# Patient Record
Sex: Female | Born: 1935
Health system: Southern US, Community
[De-identification: ages and names within clinical notes are randomized; demographics above are authoritative.]

## PROBLEM LIST (undated history)

## (undated) DIAGNOSIS — H9319 Tinnitus, unspecified ear: Secondary | ICD-10-CM

## (undated) DIAGNOSIS — H9190 Unspecified hearing loss, unspecified ear: Secondary | ICD-10-CM

## (undated) DIAGNOSIS — G4700 Insomnia, unspecified: Secondary | ICD-10-CM

## (undated) DIAGNOSIS — E039 Hypothyroidism, unspecified: Secondary | ICD-10-CM

## (undated) DIAGNOSIS — Z8781 Personal history of (healed) traumatic fracture: Secondary | ICD-10-CM

## (undated) HISTORY — PX: MOHS SURGERY: SHX181

## (undated) HISTORY — DX: Insomnia, unspecified: G47.00

## (undated) HISTORY — DX: Hypothyroidism, unspecified: E03.9

## (undated) HISTORY — DX: Personal history of (healed) traumatic fracture: Z87.81

## (undated) HISTORY — DX: Tinnitus, unspecified ear: H93.19

## (undated) HISTORY — DX: Unspecified hearing loss, unspecified ear: H91.90

## (undated) NOTE — *Deleted (*Deleted)
  Problem: Education: Goal: Knowledge of General Education information will improve Description: Including pain rating scale, medication(s)/side effects and non-pharmacologic comfort measures Outcome: Progressing   Problem: Clinical Measurements: Goal: Diagnostic test results will improve Outcome: Progressing   Problem: Activity: Goal: Risk for activity intolerance will decrease Outcome: Progressing   

---

## 1984-03-01 HISTORY — PX: CLAVICLE EXCISION: SHX597

## 2005-03-01 HISTORY — PX: CHOLECYSTECTOMY: SHX55

## 2014-04-06 DIAGNOSIS — J0140 Acute pansinusitis, unspecified: Secondary | ICD-10-CM | POA: Diagnosis not present

## 2014-05-08 DIAGNOSIS — R7301 Impaired fasting glucose: Secondary | ICD-10-CM | POA: Diagnosis not present

## 2014-05-08 DIAGNOSIS — E039 Hypothyroidism, unspecified: Secondary | ICD-10-CM | POA: Diagnosis not present

## 2014-05-08 DIAGNOSIS — E780 Pure hypercholesterolemia: Secondary | ICD-10-CM | POA: Diagnosis not present

## 2014-05-08 DIAGNOSIS — E559 Vitamin D deficiency, unspecified: Secondary | ICD-10-CM | POA: Diagnosis not present

## 2014-05-15 DIAGNOSIS — Z6830 Body mass index (BMI) 30.0-30.9, adult: Secondary | ICD-10-CM | POA: Diagnosis not present

## 2014-05-15 DIAGNOSIS — E780 Pure hypercholesterolemia: Secondary | ICD-10-CM | POA: Diagnosis not present

## 2014-05-15 DIAGNOSIS — E039 Hypothyroidism, unspecified: Secondary | ICD-10-CM | POA: Diagnosis not present

## 2014-05-15 DIAGNOSIS — R7301 Impaired fasting glucose: Secondary | ICD-10-CM | POA: Diagnosis not present

## 2014-05-24 DIAGNOSIS — D492 Neoplasm of unspecified behavior of bone, soft tissue, and skin: Secondary | ICD-10-CM | POA: Diagnosis not present

## 2014-05-24 DIAGNOSIS — C44529 Squamous cell carcinoma of skin of other part of trunk: Secondary | ICD-10-CM | POA: Diagnosis not present

## 2014-06-18 DIAGNOSIS — C44529 Squamous cell carcinoma of skin of other part of trunk: Secondary | ICD-10-CM | POA: Diagnosis not present

## 2014-06-18 DIAGNOSIS — L089 Local infection of the skin and subcutaneous tissue, unspecified: Secondary | ICD-10-CM | POA: Diagnosis not present

## 2014-07-17 DIAGNOSIS — H905 Unspecified sensorineural hearing loss: Secondary | ICD-10-CM | POA: Diagnosis not present

## 2014-08-06 DIAGNOSIS — Z111 Encounter for screening for respiratory tuberculosis: Secondary | ICD-10-CM | POA: Diagnosis not present

## 2014-08-24 DIAGNOSIS — M79672 Pain in left foot: Secondary | ICD-10-CM | POA: Diagnosis not present

## 2014-08-24 DIAGNOSIS — M79671 Pain in right foot: Secondary | ICD-10-CM | POA: Diagnosis not present

## 2014-08-26 DIAGNOSIS — S92355A Nondisplaced fracture of fifth metatarsal bone, left foot, initial encounter for closed fracture: Secondary | ICD-10-CM | POA: Diagnosis not present

## 2014-09-05 DIAGNOSIS — L570 Actinic keratosis: Secondary | ICD-10-CM | POA: Diagnosis not present

## 2014-09-16 DIAGNOSIS — S92355D Nondisplaced fracture of fifth metatarsal bone, left foot, subsequent encounter for fracture with routine healing: Secondary | ICD-10-CM | POA: Diagnosis not present

## 2014-10-23 DIAGNOSIS — Z85828 Personal history of other malignant neoplasm of skin: Secondary | ICD-10-CM | POA: Diagnosis not present

## 2014-10-23 DIAGNOSIS — D225 Melanocytic nevi of trunk: Secondary | ICD-10-CM | POA: Diagnosis not present

## 2014-10-23 DIAGNOSIS — L570 Actinic keratosis: Secondary | ICD-10-CM | POA: Diagnosis not present

## 2014-11-15 DIAGNOSIS — E039 Hypothyroidism, unspecified: Secondary | ICD-10-CM | POA: Diagnosis not present

## 2014-11-15 DIAGNOSIS — R7301 Impaired fasting glucose: Secondary | ICD-10-CM | POA: Diagnosis not present

## 2014-11-15 DIAGNOSIS — E559 Vitamin D deficiency, unspecified: Secondary | ICD-10-CM | POA: Diagnosis not present

## 2014-11-15 DIAGNOSIS — R300 Dysuria: Secondary | ICD-10-CM | POA: Diagnosis not present

## 2014-11-15 DIAGNOSIS — E780 Pure hypercholesterolemia: Secondary | ICD-10-CM | POA: Diagnosis not present

## 2014-11-20 DIAGNOSIS — R7301 Impaired fasting glucose: Secondary | ICD-10-CM | POA: Diagnosis not present

## 2014-11-20 DIAGNOSIS — Z6830 Body mass index (BMI) 30.0-30.9, adult: Secondary | ICD-10-CM | POA: Diagnosis not present

## 2014-11-20 DIAGNOSIS — E038 Other specified hypothyroidism: Secondary | ICD-10-CM | POA: Diagnosis not present

## 2014-11-20 DIAGNOSIS — Z23 Encounter for immunization: Secondary | ICD-10-CM | POA: Diagnosis not present

## 2014-11-20 DIAGNOSIS — E780 Pure hypercholesterolemia: Secondary | ICD-10-CM | POA: Diagnosis not present

## 2015-05-21 DIAGNOSIS — R7301 Impaired fasting glucose: Secondary | ICD-10-CM | POA: Diagnosis not present

## 2015-05-21 DIAGNOSIS — E559 Vitamin D deficiency, unspecified: Secondary | ICD-10-CM | POA: Diagnosis not present

## 2015-05-21 DIAGNOSIS — R300 Dysuria: Secondary | ICD-10-CM | POA: Diagnosis not present

## 2015-05-21 DIAGNOSIS — E7801 Familial hypercholesterolemia: Secondary | ICD-10-CM | POA: Diagnosis not present

## 2015-05-21 DIAGNOSIS — Z6829 Body mass index (BMI) 29.0-29.9, adult: Secondary | ICD-10-CM | POA: Diagnosis not present

## 2015-05-21 DIAGNOSIS — E038 Other specified hypothyroidism: Secondary | ICD-10-CM | POA: Diagnosis not present

## 2015-07-10 DIAGNOSIS — J069 Acute upper respiratory infection, unspecified: Secondary | ICD-10-CM | POA: Diagnosis not present

## 2015-07-10 DIAGNOSIS — J209 Acute bronchitis, unspecified: Secondary | ICD-10-CM | POA: Diagnosis not present

## 2015-09-09 ENCOUNTER — Encounter: Payer: Self-pay | Admitting: Internal Medicine

## 2015-09-09 ENCOUNTER — Non-Acute Institutional Stay (INDEPENDENT_AMBULATORY_CARE_PROVIDER_SITE_OTHER): Payer: Medicare Other | Admitting: Internal Medicine

## 2015-09-09 DIAGNOSIS — H9193 Unspecified hearing loss, bilateral: Secondary | ICD-10-CM

## 2015-09-09 DIAGNOSIS — E039 Hypothyroidism, unspecified: Secondary | ICD-10-CM | POA: Diagnosis not present

## 2015-09-09 DIAGNOSIS — G4700 Insomnia, unspecified: Secondary | ICD-10-CM

## 2015-09-09 NOTE — Progress Notes (Signed)
Patient ID: Lisa Castillo, female   DOB: [birth year], 82 y.o.   MRN: [id number]    HISTORY AND PHYSICAL  Location:    [facility]   Place of Service: Clinic (12)   Extended Emergency Contact Information Primary Emergency Contact: Harris,Margaret Address: [place],  [address] [name] Phone: [phone] Mobile Phone: [phone] Relation: Daughter  Advanced Directive information Does patient have an advance directive?: Yes, Type of Advance Directive: Healthcare Power of [place];Living will  Chief Complaint  Patient presents with  . Medical Management of Chronic Issues    New patient to get Est with [facility]. Moved to [facility] 12/23/14. Needs refill on Synthroid.     HPI:  Generally feeling well. No specific complaints today. Here to establish care.  Hearing loss, bilateral - chronic and without change  Hypothyroidism, unspecified hypothyroidism type - compensated. Asymptomatic.  Insomnia - using temazepam successfully    Past Medical History  Diagnosis Date  . Hypothyroidism   . Tinnitus     Past Surgical History  Procedure Laterality Date  . Cholecystectomy  2007    Dr. Verdene Lennert  . Mohs surgery      past 10 years chest & legs  . Clavicle excision  1986    No care team member to display  Social History   Social History  . Marital Status: Widowed    Spouse Name: N/A  . Number of Children: N/A  . Years of Education: N/A   Occupational History  . retired Web designer    Social History Main Topics  . Smoking status: Never Smoker   . Smokeless tobacco: Never Used  . Alcohol Use: Yes     Comment: 5 per week  . Drug Use: No  . Sexual Activity: No   Other Topics Concern  . Not on file   Social History Narrative   Lives at [facility] since 12/23/14   Widowed   Never smoked   Alcohol 5 per week   Exercise 3 times a week classes, walking.    Activities [place], book groups   POA, Living Will   Still driving          reports that she has never smoked. She has never used smokeless tobacco. She reports that she drinks alcohol. She reports that she does not use illicit drugs.  Family History  Problem Relation Age of Onset  . Heart disease Mother   . Heart disease Father    Family Status  Relation Status Death Age  . Mother Deceased 51  . Father Deceased 90  . Brother Alive   . Daughter Alive   . Son Alive   . Daughter Alive   . Daughter Alive     Immunization History  Administered Date(s) Administered  . DTaP 07/20/2013  . Influenza-Unspecified 11/20/2014  . PPD Test 08/06/2014  . Pneumococcal Conjugate-13 11/07/2013  . Zoster 03/01/2006    No Known Allergies  Medications: Patient's Medications  New Prescriptions   No medications on file  Previous Medications   CHOLECALCIFEROL (VITAMIN D3) 2000 UNITS TABS    Take by mouth. Take one Vitamin D daily   LEVOTHYROXINE (SYNTHROID, LEVOTHROID) 50 MCG TABLET    Take by mouth. Take one tablet at least 30 minutes before breakfast without other medications, for thyroid   MULTIPLE VITAMIN PO    Take by mouth. Take one vitamin daily   OMEGA-3 1400 MG CAPS  Take by mouth. Take one tablet 2-3 times a week   TEMAZEPAM (RESTORIL) 15 MG CAPSULE    Take 15 mg by mouth. Take one at bedtime as needed for sleep  Modified Medications   No medications on file  Discontinued Medications   No medications on file    Review of Systems  Constitutional: Negative for activity change, appetite change, chills, diaphoresis, fatigue, fever and unexpected weight change.  HENT: Positive for hearing loss and tinnitus. Negative for congestion, ear discharge, ear pain, postnasal drip, rhinorrhea, sore throat, trouble swallowing and voice change.   Eyes: Positive for visual disturbance (Corrective lenses). Negative for pain, redness and itching.  Respiratory: Negative for cough, choking, shortness of breath and wheezing.   Cardiovascular:  Negative for chest pain, palpitations and leg swelling.  Gastrointestinal: Negative for abdominal distention, abdominal pain, constipation, diarrhea and nausea.  Endocrine: Negative for cold intolerance, heat intolerance, polydipsia, polyphagia and polyuria.       Hypothyroid  Genitourinary: Negative for difficulty urinating, dysuria, flank pain, frequency, hematuria, pelvic pain, urgency and vaginal discharge.  Musculoskeletal: Negative for arthralgias, back pain, gait problem, myalgias, neck pain and neck stiffness.  Skin: Negative for color change, pallor and rash.  Allergic/Immunologic: Negative.   Neurological: Negative for dizziness, tremors, seizures, syncope, weakness, numbness and headaches.  Hematological: Negative for adenopathy. Does not bruise/bleed easily.  Psychiatric/Behavioral: Positive for sleep disturbance. Negative for agitation, behavioral problems, confusion, dysphoric mood, hallucinations and suicidal ideas. The patient is not nervous/anxious and is not hyperactive.     Filed Vitals:   09/09/15 1034  BP: 124/62  Pulse: 88  Temp: 98 F (36.7 C)  TempSrc: Oral  Height: 5' (1.524 m)  Weight: 155 lb (70.308 kg)  SpO2: 99%   Body mass index is 30.27 kg/(m^2). Filed Weights   09/09/15 1034  Weight: 155 lb (70.308 kg)     Physical Exam  Constitutional: She is oriented to person, place, and time. She appears well-developed and well-nourished. No distress.  HENT:  Right Ear: External ear normal.  Left Ear: External ear normal.  Nose: Nose normal.  Mouth/Throat: Oropharynx is clear and moist. No oropharyngeal exudate.  Eyes: Conjunctivae and EOM are normal. Pupils are equal, round, and reactive to light. No scleral icterus.  Neck: No JVD present. No tracheal deviation present. No thyromegaly present.  Cardiovascular: Normal rate, regular rhythm, normal heart sounds and intact distal pulses.  Exam reveals no gallop and no friction rub.   No murmur  heard. Pulmonary/Chest: Effort normal. No respiratory distress. She has no wheezes. She has no rales. She exhibits no tenderness.  Abdominal: She exhibits no distension and no mass. There is no tenderness.  Musculoskeletal: Normal range of motion. She exhibits no edema or tenderness.  Lymphadenopathy:    She has no cervical adenopathy.  Neurological: She is alert and oriented to person, place, and time. No cranial nerve deficit. Coordination normal.  08/26/2015 MMSE 29/30. Failed clockdrawing.  Skin: No rash noted. She is not diaphoretic. No erythema. No pallor.  Psychiatric: She has a normal mood and affect. Her behavior is normal. Judgment and thought content normal.    Labs reviewed: No flowsheet data found. No results found for: BUN No results found for: HGBA1C No results found for: TSH, T3TOTAL, T4TOTAL, THYROIDAB    Assessment/Plan  1. Hearing loss, bilateral Continue with hearing aids.  2. Hypothyroidism, unspecified hypothyroidism type Continue current dose levothyroxin  3. Insomnia Continue temazepam

## 2015-09-10 ENCOUNTER — Other Ambulatory Visit: Payer: Self-pay

## 2015-09-10 MED ORDER — LEVOTHYROXINE SODIUM 50 MCG PO TABS: ORAL_TABLET | ORAL | Status: DC

## 2015-09-10 NOTE — Telephone Encounter (Signed)
Received call from [facility] from [facility], patient was seen yesterday and forgot to get refill on Synthroid, [facility]. Done

## 2015-09-29 ENCOUNTER — Encounter: Payer: Self-pay | Admitting: Internal Medicine

## 2015-09-29 DIAGNOSIS — H9190 Unspecified hearing loss, unspecified ear: Secondary | ICD-10-CM | POA: Insufficient documentation

## 2015-09-29 DIAGNOSIS — G4700 Insomnia, unspecified: Secondary | ICD-10-CM | POA: Insufficient documentation

## 2015-09-29 DIAGNOSIS — E039 Hypothyroidism, unspecified: Secondary | ICD-10-CM | POA: Insufficient documentation

## 2015-12-10 DIAGNOSIS — Z23 Encounter for immunization: Secondary | ICD-10-CM | POA: Diagnosis not present

## 2015-12-24 ENCOUNTER — Other Ambulatory Visit: Payer: Self-pay

## 2015-12-24 DIAGNOSIS — E038 Other specified hypothyroidism: Secondary | ICD-10-CM

## 2016-02-18 DIAGNOSIS — H16142 Punctate keratitis, left eye: Secondary | ICD-10-CM | POA: Diagnosis not present

## 2016-02-18 DIAGNOSIS — Z961 Presence of intraocular lens: Secondary | ICD-10-CM | POA: Diagnosis not present

## 2016-03-25 DIAGNOSIS — H01006 Unspecified blepharitis left eye, unspecified eyelid: Secondary | ICD-10-CM | POA: Diagnosis not present

## 2016-03-25 DIAGNOSIS — H16142 Punctate keratitis, left eye: Secondary | ICD-10-CM | POA: Diagnosis not present

## 2016-03-25 DIAGNOSIS — Z961 Presence of intraocular lens: Secondary | ICD-10-CM | POA: Diagnosis not present

## 2016-03-25 DIAGNOSIS — H01003 Unspecified blepharitis right eye, unspecified eyelid: Secondary | ICD-10-CM | POA: Diagnosis not present

## 2016-04-27 ENCOUNTER — Non-Acute Institutional Stay: Payer: Medicare Other | Admitting: Internal Medicine

## 2016-04-27 ENCOUNTER — Encounter: Payer: Self-pay | Admitting: Internal Medicine

## 2016-04-27 VITALS — BP 136/80 | HR 104 | Temp 97.9°F | Ht 60.0 in | Wt 162.0 lb

## 2016-04-27 DIAGNOSIS — R2681 Unsteadiness on feet: Secondary | ICD-10-CM | POA: Diagnosis not present

## 2016-04-27 DIAGNOSIS — E038 Other specified hypothyroidism: Secondary | ICD-10-CM | POA: Diagnosis not present

## 2016-04-27 DIAGNOSIS — R269 Unspecified abnormalities of gait and mobility: Secondary | ICD-10-CM | POA: Insufficient documentation

## 2016-04-27 NOTE — Progress Notes (Signed)
Facility  [facility]    Place of Service: Clinic (12)     No Known Allergies  Chief Complaint  Patient presents with  . Acute Visit    balance, dizzy when she stands up for 2 weeks.    HPI:  Onset of off balance feeling about 2 weeks ago. Has improved. Says she had a feeling like she might fall, especially in the mornings when getting out of bed. States there was no spinning vertiginous sensation. No other associated symptoms. Denies change in hearing or vision, or headache. No loss of coordination.  Medications: Patient's Medications  New Prescriptions   No medications on file  Previous Medications   CHOLECALCIFEROL (VITAMIN D3) 2000 UNITS TABS    Take by mouth. Take one Vitamin D daily   LEVOTHYROXINE (SYNTHROID, LEVOTHROID) 50 MCG TABLET    Take one tablet at least 30 minutes before breakfast without other medications, for thyroid   MULTIPLE VITAMIN PO    Take by mouth. Take one vitamin daily   OMEGA-3 1400 MG CAPS    Take by mouth. Take one tablet 5  times a week  Modified Medications   No medications on file  Discontinued Medications   TEMAZEPAM (RESTORIL) 15 MG CAPSULE    Take 15 mg by mouth. Take one at bedtime as needed for sleep     Review of Systems  Constitutional: Negative for activity change, appetite change, chills, diaphoresis, fatigue, fever and unexpected weight change.  HENT: Positive for hearing loss and tinnitus. Negative for congestion, ear discharge, ear pain, postnasal drip, rhinorrhea, sore throat, trouble swallowing and voice change.   Eyes: Positive for visual disturbance (Corrective lenses). Negative for pain, redness and itching.  Respiratory: Negative for cough, choking, shortness of breath and wheezing.   Cardiovascular: Negative for chest pain, palpitations and leg swelling.  Gastrointestinal: Negative for abdominal distention, abdominal pain, constipation, diarrhea and nausea.  Endocrine: Negative for cold intolerance, heat intolerance,  polydipsia, polyphagia and polyuria.       Hypothyroid  Genitourinary: Negative for difficulty urinating, dysuria, flank pain, frequency, hematuria, pelvic pain, urgency and vaginal discharge.  Musculoskeletal: Negative for arthralgias, back pain, gait problem, myalgias, neck pain and neck stiffness.  Skin: Negative for color change, pallor and rash.  Allergic/Immunologic: Negative.   Neurological: Negative for dizziness, tremors, seizures, syncope, weakness, numbness and headaches.       Off balance feeling  Hematological: Negative for adenopathy. Does not bruise/bleed easily.  Psychiatric/Behavioral: Positive for sleep disturbance. Negative for agitation, behavioral problems, confusion, dysphoric mood, hallucinations and suicidal ideas. The patient is not nervous/anxious and is not hyperactive.     Vitals:   04/27/16 0916 04/27/16 0922  BP: 132/64 136/80  Pulse: 80 (!) 104  Temp: 97.9 F (36.6 C)   TempSrc: Oral   SpO2: 98%   Weight: 162 lb (73.5 kg)   Height: 5' (1.524 m)    Wt Readings from Last 3 Encounters:  04/27/16 162 lb (73.5 kg)  09/09/15 155 lb (70.3 kg)    Body mass index is 31.64 kg/m.  Physical Exam  Constitutional: She is oriented to person, place, and time. She appears well-developed and well-nourished. No distress.  HENT:  Right Ear: External ear normal.  Left Ear: External ear normal.  Nose: Nose normal.  Mouth/Throat: Oropharynx is clear and moist. No oropharyngeal exudate.  Eyes: Conjunctivae and EOM are normal. Pupils are equal, round, and reactive to light. No scleral icterus.  Neck: No JVD present. No tracheal deviation  present. No thyromegaly present.  Cardiovascular: Normal rate, regular rhythm, normal heart sounds and intact distal pulses.  Exam reveals no gallop and no friction rub.   No murmur heard. Pulmonary/Chest: Effort normal. No respiratory distress. She has no wheezes. She has no rales. She exhibits no tenderness.  Abdominal: She exhibits  no distension and no mass. There is no tenderness.  Musculoskeletal: Normal range of motion. She exhibits no edema or tenderness.  Lymphadenopathy:    She has no cervical adenopathy.  Neurological: She is alert and oriented to person, place, and time. No cranial nerve deficit. Coordination normal.  08/26/2015 MMSE 29/30. Failed clockdrawing. Normal tandem gait. Able to stand with eyes closed without evidence of instability. She can walk on toes and heels without imbalance.  Skin: No rash noted. She is not diaphoretic. No erythema. No pallor.  Psychiatric: She has a normal mood and affect. Her behavior is normal. Judgment and thought content normal.     Labs reviewed: No flowsheet data found. No results found for: TSH No results found for: BUN No results found for: CREATININE No results found for: HGBA1C     Assessment/Plan  1. Unstable gait - CMP  2. Other specified hypothyroidism - TSH

## 2016-04-28 DIAGNOSIS — E038 Other specified hypothyroidism: Secondary | ICD-10-CM | POA: Diagnosis not present

## 2016-04-29 ENCOUNTER — Other Ambulatory Visit: Payer: Self-pay

## 2016-04-29 LAB — COMPLETE METABOLIC PANEL WITH GFR
ALBUMIN: 3.7 g/dL (ref 3.6–5.1)
ALK PHOS: 98 U/L (ref 33–130)
ALT: 13 U/L (ref 6–29)
AST: 17 U/L (ref 10–35)
BUN: 11 mg/dL (ref 7–25)
CALCIUM: 8.7 mg/dL (ref 8.6–10.4)
CO2: 27 mmol/L (ref 20–31)
Chloride: 105 mmol/L (ref 98–110)
Creat: 0.73 mg/dL (ref 0.60–0.88)
GFR, EST AFRICAN AMERICAN: 87 mL/min (ref >=60)
GFR, EST NON AFRICAN AMERICAN: 75 mL/min (ref >=60)
Glucose, Bld: 116 mg/dL — ABNORMAL HIGH (ref 65–99)
POTASSIUM: 4.2 mmol/L (ref 3.5–5.3)
Sodium: 139 mmol/L (ref 135–146)
Total Bilirubin: 0.3 mg/dL (ref 0.2–1.2)
Total Protein: 6.4 g/dL (ref 6.1–8.1)

## 2016-04-29 LAB — TSH: TSH: 4.55 m[IU]/L — AB

## 2016-05-03 ENCOUNTER — Telehealth: Payer: Self-pay | Admitting: *Deleted

## 2016-05-03 MED ORDER — TEMAZEPAM 15 MG PO CAPS: ORAL_CAPSULE | ORAL | 1 refills | Status: DC

## 2016-05-03 NOTE — Telephone Encounter (Signed)
Rx faxed to pharmacy  

## 2016-05-03 NOTE — Telephone Encounter (Signed)
Patient called and stated that she is dealing with family problems right now and having a hard time sleeping at night. Wants to know if we could call in her old Rx for Temazepam 15mg  into [facility]. Please Advise.

## 2016-05-03 NOTE — Telephone Encounter (Signed)
OK to fil Temazepam 15 mg (30) One nightly if needed for sleep.

## 2016-06-09 ENCOUNTER — Other Ambulatory Visit: Payer: Self-pay | Admitting: Internal Medicine

## 2016-06-29 ENCOUNTER — Encounter: Payer: Self-pay | Admitting: Internal Medicine

## 2016-06-29 ENCOUNTER — Non-Acute Institutional Stay: Payer: Medicare Other | Admitting: Internal Medicine

## 2016-06-29 VITALS — BP 104/64 | HR 88 | Temp 97.9°F | Ht 60.0 in | Wt 162.0 lb

## 2016-06-29 DIAGNOSIS — R2681 Unsteadiness on feet: Secondary | ICD-10-CM

## 2016-06-29 DIAGNOSIS — E038 Other specified hypothyroidism: Secondary | ICD-10-CM

## 2016-06-29 DIAGNOSIS — F5101 Primary insomnia: Secondary | ICD-10-CM

## 2016-06-29 DIAGNOSIS — R739 Hyperglycemia, unspecified: Secondary | ICD-10-CM | POA: Diagnosis not present

## 2016-06-29 DIAGNOSIS — E782 Mixed hyperlipidemia: Secondary | ICD-10-CM | POA: Insufficient documentation

## 2016-06-29 DIAGNOSIS — E785 Hyperlipidemia, unspecified: Secondary | ICD-10-CM | POA: Diagnosis not present

## 2016-06-29 NOTE — Progress Notes (Signed)
Streeter of Service: Clinic (12)     No Known Allergies  Chief Complaint  Patient presents with  . Medical Management of Chronic Issues    medication management thyroid, unstable gait, review lab    HPI:  Other specified hypothyroidism - trivial elevation in the TSH at 4.55  Hyperglycemia - CBG 116. She reports glucose has had a mild elevation for many years.Lisa Castillo is only eating about 1 dessert weekly.  Unstable gait - no falls  Primary insomnia - stable to improved    Medications: Patient's Medications  New Prescriptions   No medications on file  Previous Medications   CHOLECALCIFEROL (VITAMIN D3) 2000 UNITS TABS    Take by mouth. Take one Vitamin D daily   MULTIPLE VITAMIN PO    Take by mouth. Take one vitamin daily   OMEGA-3 1400 MG CAPS    Take by mouth. Take one tablet 5  times a week   SYNTHROID 50 MCG TABLET    TAKE 1 TABLET BY MOUTH AT LEAST 30 MINUTES BEFORE BREAKFAST WITHOUT OTHER MEDICATIONS FOR THYROID   TEMAZEPAM (RESTORIL) 15 MG CAPSULE    Take one capsule by mouth nightly as needed for sleep  Modified Medications   No medications on file  Discontinued Medications   No medications on file     Review of Systems  Constitutional: Negative for activity change, appetite change, chills, diaphoresis, fatigue, fever and unexpected weight change.  HENT: Positive for hearing loss and tinnitus. Negative for congestion, ear discharge, ear pain, postnasal drip, rhinorrhea, sore throat, trouble swallowing and voice change.   Eyes: Positive for visual disturbance (Corrective lenses). Negative for pain, redness and itching.  Respiratory: Negative for cough, choking, shortness of breath and wheezing.   Cardiovascular: Negative for chest pain, palpitations and leg swelling.  Gastrointestinal: Negative for abdominal distention, abdominal pain, constipation, diarrhea and nausea.  Endocrine: Negative for cold intolerance, heat intolerance, polydipsia,  polyphagia and polyuria.       Hypothyroid  Genitourinary: Negative for difficulty urinating, dysuria, flank pain, frequency, hematuria, pelvic pain, urgency and vaginal discharge.  Musculoskeletal: Negative for arthralgias, back pain, gait problem, myalgias, neck pain and neck stiffness.  Skin: Negative for color change, pallor and rash.  Allergic/Immunologic: Negative.   Neurological: Negative for dizziness, tremors, seizures, syncope, weakness, numbness and headaches.       Off balance feeling  Hematological: Negative for adenopathy. Does not bruise/bleed easily.  Psychiatric/Behavioral: Positive for sleep disturbance. Negative for agitation, behavioral problems, confusion, dysphoric mood, hallucinations and suicidal ideas. The patient is not nervous/anxious and is not hyperactive.     Vitals:   06/29/16 1054  BP: 104/64  Pulse: 88  Temp: 97.9 F (36.6 C)  TempSrc: Oral  SpO2: 98%  Weight: 162 lb (73.5 kg)  Height: 5' (1.524 m)   Wt Readings from Last 3 Encounters:  06/29/16 162 lb (73.5 kg)  04/27/16 162 lb (73.5 kg)  09/09/15 155 lb (70.3 kg)    Body mass index is 31.64 kg/m.  Physical Exam  Constitutional: She is oriented to person, place, and time. She appears well-developed and well-nourished. No distress.  HENT:  Right Ear: External ear normal.  Left Ear: External ear normal.  Nose: Nose normal.  Mouth/Throat: Oropharynx is clear and moist. No oropharyngeal exudate.  Eyes: Conjunctivae and EOM are normal. Pupils are equal, round, and reactive to light. No scleral icterus.  Neck: No JVD present. No tracheal deviation present. No  thyromegaly present.  Cardiovascular: Normal rate, regular rhythm, normal heart sounds and intact distal pulses.  Exam reveals no gallop and no friction rub.   No murmur heard. Pulmonary/Chest: Effort normal. No respiratory distress. She has no wheezes. She has no rales. She exhibits no tenderness.  Abdominal: She exhibits no distension and  no mass. There is no tenderness.  Musculoskeletal: Normal range of motion. She exhibits no edema or tenderness.  Lymphadenopathy:    She has no cervical adenopathy.  Neurological: She is alert and oriented to person, place, and time. No cranial nerve deficit. Coordination normal.  08/26/2015 MMSE 29/30. Failed clockdrawing. Normal tandem gait. Able to stand with eyes closed without evidence of instability. She can walk on toes and heels without imbalance.  Skin: No rash noted. She is not diaphoretic. No erythema. No pallor.  Psychiatric: She has a normal mood and affect. Her behavior is normal. Judgment and thought content normal.     Labs reviewed: Lab Summary Latest Ref Rng & Units 04/28/2016  Hemoglobin - (None)  Hematocrit - (None)  White count - (None)  Platelet count - (None)  Sodium 135 - 146 mmol/L 139  Potassium 3.5 - 5.3 mmol/L 4.2  Calcium 8.6 - 10.4 mg/dL 8.7  Phosphorus - (None)  Creatinine 0.60 - 0.88 mg/dL 0.73  AST 10 - 35 U/L 17  Alk Phos 33 - 130 U/L 98  Bilirubin 0.2 - 1.2 mg/dL 0.3  Glucose 65 - 99 mg/dL 116(H)  Cholesterol - (None)  HDL cholesterol - (None)  Triglycerides - (None)  LDL Direct - (None)  LDL Calc - (None)  Total protein 6.1 - 8.1 g/dL 6.4  Albumin 3.6 - 5.1 g/dL 3.7   Lab Results  Component Value Date   TSH 4.55 (H) 04/28/2016   Lab Results  Component Value Date   BUN 11 04/28/2016   Lab Results  Component Value Date   CREATININE 0.73 04/28/2016   No results found for: HGBA1C     Assessment/Plan  1. Other specified hypothyroidism The current medical regimen is effective;  continue present plan and medications. - TSH; Future  2. Hyperglycemia - lose weght - Hemoglobin A1c; Future - Comprehensive metabolic panel; Future - Microalbumin, urine; Future  3. Unstable gait improved  4. Primary insomnia stable  5. Hyperlipidemia, unspecified hyperlipidemia type - Lipid panel; Future

## 2016-08-02 ENCOUNTER — Encounter: Payer: Self-pay | Admitting: Internal Medicine

## 2016-08-10 ENCOUNTER — Encounter: Payer: Self-pay | Admitting: Internal Medicine

## 2016-08-16 ENCOUNTER — Telehealth: Payer: Self-pay | Admitting: *Deleted

## 2016-08-16 DIAGNOSIS — Z0110 Encounter for examination of ears and hearing without abnormal findings: Secondary | ICD-10-CM

## 2016-08-16 NOTE — Telephone Encounter (Signed)
Patient called and stated that she needed a referral to [facility]. Has an appointment for 09/06/2016. Referral placed.

## 2016-08-31 NOTE — Addendum Note (Signed)
Addended by: Royann Shivers A on: 08/31/2016 04:06 PM   Modules accepted: Orders

## 2016-09-07 ENCOUNTER — Encounter: Payer: Self-pay | Admitting: Internal Medicine

## 2016-09-19 ENCOUNTER — Other Ambulatory Visit: Payer: Self-pay | Admitting: Internal Medicine

## 2016-10-12 ENCOUNTER — Other Ambulatory Visit: Payer: Self-pay | Admitting: *Deleted

## 2016-10-12 DIAGNOSIS — E785 Hyperlipidemia, unspecified: Secondary | ICD-10-CM

## 2016-10-12 DIAGNOSIS — R2681 Unsteadiness on feet: Secondary | ICD-10-CM

## 2016-10-12 DIAGNOSIS — E039 Hypothyroidism, unspecified: Secondary | ICD-10-CM

## 2016-10-12 DIAGNOSIS — G4700 Insomnia, unspecified: Secondary | ICD-10-CM

## 2016-10-29 ENCOUNTER — Encounter: Payer: Self-pay | Admitting: Internal Medicine

## 2016-11-23 ENCOUNTER — Non-Acute Institutional Stay: Payer: Medicare Other | Admitting: Family

## 2016-11-23 ENCOUNTER — Encounter: Payer: Self-pay | Admitting: Family

## 2016-11-23 VITALS — BP 136/78 | HR 89 | Temp 97.6°F | Resp 16 | Ht 60.0 in | Wt 162.0 lb

## 2016-11-23 DIAGNOSIS — M25571 Pain in right ankle and joints of right foot: Secondary | ICD-10-CM | POA: Diagnosis not present

## 2016-11-23 MED ORDER — ACETAMINOPHEN 500 MG PO TABS: 500.0000 mg | ORAL_TABLET | Freq: Four times a day (QID) | ORAL | 0 refills | Status: DC | PRN

## 2016-11-23 NOTE — Progress Notes (Signed)
Location:  [facility] (12) Provider: Huan Pollok FNP-C  Blanchie Serve, MD  Patient Care Team: Blanchie Serve, MD as PCP - General (Internal Medicine)  Extended Emergency Contact Information Primary Emergency Contact: Harris,Margaret Address: [address]          [place], [place] [address] [name] of [place] Phone: [phone] Mobile Phone: [phone] Relation: Daughter  Code Status:  DNR Goals of care: Advanced Directive information Advanced Directives 06/29/2016  Does Patient Have a Medical Advance Directive? Yes  Type of Advance Directive [place] in Chart? Yes     Chief Complaint  Patient presents with  . Acute Visit    right ankle pain x 2 days; no recalled injury;     HPI:  Pt is a 83 y.o. female seen [facility] at [facility] for an acute visit for evaluation of right ankle pain x 2 days. She denies any injury or twisting to ankle.she states leg has been swollen at times.No fever, chills or redness noted.    Past Medical History:  Diagnosis Date  . Hearing loss   . History of fracture of clavicle   . Hypothyroidism   . Insomnia   . Tinnitus    Past Surgical History:  Procedure Laterality Date  . CHOLECYSTECTOMY  2007   Dr. Verdene Lennert  . CLAVICLE EXCISION  1986  . MOHS SURGERY     past 10 years chest & legs    No Known Allergies  Outpatient Encounter Prescriptions as of 11/23/2016  Medication Sig  . Cholecalciferol (VITAMIN D3) 2000 units TABS Take by mouth. Take one Vitamin D daily  . MULTIPLE VITAMIN PO Take by mouth. Take one vitamin daily  . Omega-3 1400 MG CAPS Take by mouth. Take one tablet 5  times a week  . SYNTHROID 50 MCG tablet TAKE 1 TABLET BY MOUTH AT LEAST 30 MINUTES BEFORE BREAKFAST WITHOUT OTHER MEDICATIONS FOR THYROID  . temazepam (RESTORIL) 15 MG capsule Take one capsule by mouth nightly as needed for sleep  . acetaminophen  (TYLENOL) 500 MG tablet Take 1 tablet (500 mg total) by mouth every 6 (six) hours as needed for moderate pain.   No facility-administered encounter medications on file as of 11/23/2016.     Review of Systems  Musculoskeletal: Positive for joint swelling.       Right ankle pain worst with movement.   All other systems reviewed and are negative.   Immunization History  Administered Date(s) Administered  . DTaP 07/20/2013  . Influenza-Unspecified 11/20/2014, 12/12/2015  . PPD Test 08/06/2014  . Pneumococcal Conjugate-13 11/07/2013  . Zoster 03/01/2006   Pertinent  Health Maintenance Due  Topic Date Due  . DEXA SCAN  08/20/1995  . PNA vac Low Risk Adult (2 of 2 - PPSV23) 11/08/2014  . INFLUENZA VACCINE  09/29/2016   Fall Risk  09/09/2015  Falls in the past year? No    Vitals:   11/23/16 1415  BP: 136/78  Pulse: 89  Resp: 16  Temp: 97.6 F (36.4 C)  TempSrc: Oral  SpO2: 92%  Weight: 162 lb (73.5 kg)  Height: 5' (1.524 m)   Body mass index is 31.64 kg/m. Physical Exam  Constitutional: She is oriented to person, place, and time. She appears well-developed and well-nourished. No distress.  HENT:  Head: Normocephalic.  Mouth/Throat: Oropharynx is clear and moist. No oropharyngeal exudate.  Musculoskeletal: She exhibits no edema.  Moves x 4 extremities except right ankle pain with ROM.Malleous tender to palpation. No redness or swelling noted on right ankle.   Neurological: She is oriented to person, place, and time. Coordination normal.  Skin: Skin is warm and dry. No rash noted. No erythema. No pallor.  Psychiatric: She has a normal mood and affect.   Labs reviewed:  Recent Labs  04/28/16 0001  NA 139  K 4.2  CL 105  CO2 27  GLUCOSE 116*  BUN 11  CREATININE 0.73  CALCIUM 8.7    Recent Labs  04/28/16 0001  AST 17  ALT 13  ALKPHOS 98  BILITOT 0.3  PROT 6.4  ALBUMIN 3.7   Lab Results  Component Value Date   TSH 4.55 (H) 04/28/2016    Significant  Diagnostic Results in last 30 days:  No results found.  Assessment/Plan  Acute right ankle pain Afebrile.Pain worst with ROM.Malleous tender to palpation.No redness or swelling noted.start extra strength tylenol 500 mg Tablet every 6 hours as needed for pain. Portable right Ankle X-ray 3 views to rule out fracture. Continue to monitor. Notify provider if no improvement.   Family/ staff Communication: Reviewed plan of care with patient and patient's daughter.  Labs/tests ordered: Portable right Ankle X-ray 3 views to rule out fracture.  Follow up: PRN   Sandrea Hughs, NP

## 2016-11-24 ENCOUNTER — Telehealth: Payer: Self-pay | Admitting: *Deleted

## 2016-11-24 NOTE — Telephone Encounter (Signed)
Patient notified of her negative ankle xray results. Advised to call back on 11/29/16 if no improvement after Tylenol 500mg  Q6hrs PRN. She verbalized understanding.

## 2016-12-08 DIAGNOSIS — Z23 Encounter for immunization: Secondary | ICD-10-CM | POA: Diagnosis not present

## 2016-12-14 ENCOUNTER — Other Ambulatory Visit: Payer: Self-pay | Admitting: Internal Medicine

## 2016-12-28 ENCOUNTER — Other Ambulatory Visit: Payer: Self-pay | Admitting: Internal Medicine

## 2016-12-31 DIAGNOSIS — R739 Hyperglycemia, unspecified: Secondary | ICD-10-CM | POA: Diagnosis not present

## 2016-12-31 DIAGNOSIS — R2681 Unsteadiness on feet: Secondary | ICD-10-CM | POA: Diagnosis not present

## 2016-12-31 DIAGNOSIS — G4700 Insomnia, unspecified: Secondary | ICD-10-CM | POA: Diagnosis not present

## 2016-12-31 DIAGNOSIS — E785 Hyperlipidemia, unspecified: Secondary | ICD-10-CM | POA: Diagnosis not present

## 2016-12-31 DIAGNOSIS — E039 Hypothyroidism, unspecified: Secondary | ICD-10-CM | POA: Diagnosis not present

## 2017-01-04 LAB — COMPREHENSIVE METABOLIC PANEL
AG RATIO: 1.4 (calc) (ref 1.0–2.5)
ALBUMIN MSPROF: 3.8 g/dL (ref 3.6–5.1)
ALT: 17 U/L (ref 6–29)
AST: 17 U/L (ref 10–35)
Alkaline phosphatase (APISO): 101 U/L (ref 33–130)
BUN: 16 mg/dL (ref 7–25)
CALCIUM: 8.8 mg/dL (ref 8.6–10.4)
CO2: 27 mmol/L (ref 20–32)
Chloride: 103 mmol/L (ref 98–110)
Creat: 0.66 mg/dL (ref 0.60–0.88)
GLOBULIN: 2.8 g/dL (ref 1.9–3.7)
GLUCOSE: 132 mg/dL — AB (ref 65–99)
POTASSIUM: 4.4 mmol/L (ref 3.5–5.3)
Sodium: 138 mmol/L (ref 135–146)
TOTAL PROTEIN: 6.6 g/dL (ref 6.1–8.1)
Total Bilirubin: 0.3 mg/dL (ref 0.2–1.2)

## 2017-01-04 LAB — TSH: TSH: 5.72 mIU/L — ABNORMAL HIGH (ref 0.40–4.50)

## 2017-01-04 LAB — LIPID PANEL
CHOLESTEROL: 157 mg/dL (ref <200)
HDL: 33 mg/dL — ABNORMAL LOW (ref >50)
LDL CHOLESTEROL (CALC): 97 mg/dL
Non-HDL Cholesterol (Calc): 124 mg/dL (calc) (ref <130)
Total CHOL/HDL Ratio: 4.8 (calc) (ref <5.0)
Triglycerides: 168 mg/dL — ABNORMAL HIGH (ref <150)

## 2017-01-04 LAB — MICROALBUMIN, URINE: Microalb, Ur: 0.3 mg/dL

## 2017-01-04 LAB — HEMOGLOBIN A1C
Hgb A1c MFr Bld: 6.2 % of total Hgb — ABNORMAL HIGH (ref <5.7)
Mean Plasma Glucose: 131 (calc)
eAG (mmol/L): 7.3 (calc)

## 2017-01-11 ENCOUNTER — Encounter: Payer: Self-pay | Admitting: Internal Medicine

## 2017-01-12 ENCOUNTER — Encounter: Payer: Medicare Other | Admitting: Internal Medicine

## 2017-01-13 ENCOUNTER — Encounter: Payer: Medicare Other | Admitting: Internal Medicine

## 2017-01-19 ENCOUNTER — Encounter: Payer: Self-pay | Admitting: Internal Medicine

## 2017-01-19 ENCOUNTER — Non-Acute Institutional Stay: Payer: Medicare Other | Admitting: Internal Medicine

## 2017-01-19 VITALS — BP 110/68 | HR 96 | Temp 97.8°F | Resp 16 | Ht 60.0 in | Wt 162.8 lb

## 2017-01-19 DIAGNOSIS — H9193 Unspecified hearing loss, bilateral: Secondary | ICD-10-CM

## 2017-01-19 DIAGNOSIS — E785 Hyperlipidemia, unspecified: Secondary | ICD-10-CM | POA: Diagnosis not present

## 2017-01-19 DIAGNOSIS — E669 Obesity, unspecified: Secondary | ICD-10-CM | POA: Diagnosis not present

## 2017-01-19 DIAGNOSIS — E038 Other specified hypothyroidism: Secondary | ICD-10-CM

## 2017-01-19 DIAGNOSIS — R2681 Unsteadiness on feet: Secondary | ICD-10-CM

## 2017-01-19 DIAGNOSIS — E559 Vitamin D deficiency, unspecified: Secondary | ICD-10-CM | POA: Diagnosis not present

## 2017-01-19 DIAGNOSIS — R7303 Prediabetes: Secondary | ICD-10-CM | POA: Diagnosis not present

## 2017-01-19 DIAGNOSIS — F5101 Primary insomnia: Secondary | ICD-10-CM

## 2017-01-19 DIAGNOSIS — Z780 Asymptomatic menopausal state: Secondary | ICD-10-CM | POA: Diagnosis not present

## 2017-01-19 MED ORDER — LEVOTHYROXINE SODIUM 75 MCG PO TABS: 75.0000 ug | ORAL_TABLET | Freq: Every day | ORAL | 3 refills | Status: DC

## 2017-01-19 MED ORDER — CALCIUM 600 MG PO TABS: 600.0000 mg | ORAL_TABLET | Freq: Two times a day (BID) | ORAL | 3 refills | Status: DC

## 2017-01-19 MED ORDER — TEMAZEPAM 15 MG PO CAPS: ORAL_CAPSULE | ORAL | 1 refills | Status: DC

## 2017-01-19 NOTE — Patient Instructions (Signed)
  Start taking synthroid 1 and a half tablet of current dose + 75 mcg daily until you run out of current supply. Then pick up the new strength dosing from your pharmacy  Start taking calcium supplement twice a day.  You will get a call from our office to schedule a bone density study. This is important to assess how strong your bones are and to prevent bone loss.

## 2017-01-19 NOTE — Progress Notes (Signed)
[facility]  Provider: Blanchie Serve MD   Location:  [place] of Service:  Clinic (12)  PCP: Blanchie Serve, MD Patient Care Team: Blanchie Serve, MD as PCP - General (Internal Medicine)  Extended Emergency Contact Information Primary Emergency Contact: Harris,Margaret Address: [address]          [place], [place] [address] [name] of [place] Phone: [phone] Mobile Phone: [phone] Relation: Daughter  Goals of Care: Advanced Directive information Advanced Directives 06/29/2016  Does Patient Have a Medical Advance Directive? Yes  Type of Advance Directive [place] in Chart? Yes      Chief Complaint  Patient presents with  . Medical Management of Chronic Issues    6 month follow up. No concerns at this time.   . Medication Refill    Temazepam( pending order)    HPI: [name] a 83 y.o. female seen today for routine visit. She does some group exercises and walks for exercise.   Vitamin d def- currently on vitamin d supplement. No fall reported.   Hypothyroidism- currently on levothyroxine 50 mcg daily. Takes it empty stomach. Reviewed recent TSH level.   Hyperlipidemia- currently on fish oil daily. Reviewed her recent lipid panel.   Insomnia- takes temazepam 1-2 times a week and it helps her rest. No side effects from it. No fall reported.   Unsteady gait- no assistive device used, no fall reported  uptodate with influenza vaccine.   Depression screen PHQ 2/9 01/19/2017  Decreased Interest 0  Down, Depressed, Hopeless 0  PHQ - 2 Score 0   Fall Risk  01/19/2017 11/23/2016 09/09/2015  Falls in the past year? No No No     Past Medical History:  Diagnosis Date  . Hearing loss   . History of fracture of clavicle   . Hypothyroidism   . Insomnia   . Tinnitus    Past Surgical History:  Procedure Laterality Date  . CHOLECYSTECTOMY  2007   Dr. Verdene Lennert  .  CLAVICLE EXCISION  1986  . MOHS SURGERY     past 10 years chest & legs    reports that  has never smoked. she has never used smokeless tobacco. She reports that she drinks alcohol. She reports that she does not use drugs. Social History   Socioeconomic History  . Marital status: Widowed    Spouse name: Not on file  . Number of children: Not on file  . Years of education: Not on file  . Highest education level: Not on file  Social Needs  . Financial resource strain: Not on file  . Food insecurity - worry: Not on file  . Food insecurity - inability: Not on file  . Transportation needs - medical: Not on file  . Transportation needs - non-medical: Not on file  Occupational History  . Occupation: retired Web designer  Tobacco Use  . Smoking status: Never Smoker  . Smokeless tobacco: Never Used  Substance and Sexual Activity  . Alcohol use: Yes    Comment: 5 per week  . Drug use: No  . Sexual activity: No  Other Topics Concern  . Not on file  Social History Narrative   Lives at [facility] since 12/23/14   Widowed   Never smoked   Alcohol 5 per week   Exercise 3 times a week classes, walking.    Activities Bridge, book groups  POA, Living Will   Still driving       Functional Status Survey:    Family History  Problem Relation Age of Onset  . Heart disease Mother   . Heart disease Father     Health Maintenance  Topic Date Due  . DEXA SCAN  08/20/1995  . PNA vac Low Risk Adult (2 of 2 - PPSV23) 11/08/2014  . TETANUS/TDAP  11/24/2026 (Originally 08/19/1947)  . INFLUENZA VACCINE  Completed    No Known Allergies  Outpatient Encounter Medications as of 01/19/2017  Medication Sig  . acetaminophen (TYLENOL) 500 MG tablet Take 1 tablet (500 mg total) by mouth every 6 (six) hours as needed for moderate pain.  . Cholecalciferol (VITAMIN D3) 2000 units TABS Take by mouth. Take one Vitamin D daily  . MULTIPLE VITAMIN PO Take by mouth. Take one vitamin daily  .  Omega-3 1400 MG CAPS Take by mouth. Take one tablet 5  times a week  . SYNTHROID 50 MCG tablet TAKE 1 TABLET BY MOUTH AT LEAST 30 MINUTES BEFORE BREAKFAST WITHOUT OTHER MEDICATIONS FOR THYROID  . temazepam (RESTORIL) 15 MG capsule Take one capsule by mouth nightly as needed for sleep   No facility-administered encounter medications on file as of 01/19/2017.     Review of Systems  Constitutional: Negative for appetite change, chills, fatigue and fever.  HENT: Positive for hearing loss. Negative for congestion, mouth sores, postnasal drip, rhinorrhea, sore throat and trouble swallowing.        Has hearing aids.  Eyes: Negative for discharge, redness, itching and visual disturbance.       Had cataract surgery and has corrective lenses.   Respiratory: Negative for cough, shortness of breath and wheezing.   Cardiovascular: Negative for chest pain, palpitations and leg swelling.  Gastrointestinal: Negative for abdominal pain, blood in stool, constipation, diarrhea, nausea and vomiting.  Genitourinary: Negative for dysuria, flank pain, frequency, hematuria and pelvic pain.  Musculoskeletal: Positive for arthralgias and gait problem. Negative for back pain and joint swelling.       Left hip pain mainly with movement. No fall reported.   Skin: Negative for rash and wound.  Neurological: Negative for dizziness, tremors, syncope and headaches.  Hematological: Does not bruise/bleed easily.  Psychiatric/Behavioral: Positive for sleep disturbance. Negative for behavioral problems, confusion and dysphoric mood.    Vitals:   01/19/17 1007  BP: 110/68  Pulse: 96  Resp: 16  Temp: 97.8 F (36.6 C)  TempSrc: Oral  SpO2: 97%  Weight: 162 lb 12.8 oz (73.8 kg)  Height: 5' (1.524 m)   Body mass index is 31.79 kg/m.   Wt Readings from Last 3 Encounters:  01/19/17 162 lb 12.8 oz (73.8 kg)  11/23/16 162 lb (73.5 kg)  06/29/16 162 lb (73.5 kg)   Physical Exam  Constitutional: She is oriented to  person, place, and time. She appears well-developed. No distress.  obese  HENT:  Head: Normocephalic and atraumatic.  Right Ear: External ear normal.  Left Ear: External ear normal.  Nose: Nose normal.  Mouth/Throat: Oropharynx is clear and moist. No oropharyngeal exudate.  Hearing aid to both ears  Eyes: Conjunctivae and EOM are normal. Pupils are equal, round, and reactive to light. Right eye exhibits no discharge. Left eye exhibits no discharge. No scleral icterus.  Neck: Normal range of motion. Neck supple.  Cardiovascular: Normal rate and regular rhythm.  Pulmonary/Chest: Effort normal and breath sounds normal. No respiratory distress. She has no wheezes. She has no  rales. She exhibits no tenderness.  Abdominal: Soft. Bowel sounds are normal.  Musculoskeletal: Normal range of motion. She exhibits no edema.  Arthritis changes to fingers  Lymphadenopathy:    She has no cervical adenopathy.  Neurological: She is alert and oriented to person, place, and time.  Skin: Skin is warm and dry. No rash noted. She is not diaphoretic. No pallor.  Psychiatric: She has a normal mood and affect. Her behavior is normal.    Labs reviewed: Basic Metabolic Panel: Recent Labs    04/28/16 0001 12/31/16 0000  NA 139 138  K 4.2 4.4  CL 105 103  CO2 27 27  GLUCOSE 116* 132*  BUN 11 16  CREATININE 0.73 0.66  CALCIUM 8.7 8.8   Liver Function Tests: Recent Labs    04/28/16 0001 12/31/16 0000  AST 17 17  ALT 13 17  ALKPHOS 98  --   BILITOT 0.3 0.3  PROT 6.4 6.6  ALBUMIN 3.7  --    No results for input(s): LIPASE, AMYLASE in the last 8760 hours. No results for input(s): AMMONIA in the last 8760 hours. CBC: No results for input(s): WBC, NEUTROABS, HGB, HCT, MCV, PLT in the last 8760 hours. Cardiac Enzymes: No results for input(s): CKTOTAL, CKMB, CKMBINDEX, TROPONINI in the last 8760 hours. BNP: Invalid input(s): POCBNP Lab Results  Component Value Date   HGBA1C 6.2 (H) 12/31/2016    Lab Results  Component Value Date   TSH 5.72 (H) 12/31/2016   No results found for: VITAMINB12 No results found for: FOLATE No results found for: IRON, TIBC, FERRITIN  Lipid Panel: Recent Labs    12/31/16 0000  CHOL 157  HDL 33*  TRIG 168*  CHOLHDL 4.8   Lab Results  Component Value Date   HGBA1C 6.2 (H) 12/31/2016    Procedures since last visit: No results found.  Assessment/Plan  1. Other specified hypothyroidism With elevated TSH, increase levothyroxine to 75 mcg daily from 50 mcg, monitor - levothyroxine (SYNTHROID, LEVOTHROID) 75 MCG tablet; Take 1 tablet (75 mcg total) by mouth daily before breakfast.  Dispense: 90 tablet; Refill: 3 - TSH; Future  2. Bilateral hearing loss, unspecified hearing loss type Has hearing aid, supportive care  3. Primary insomnia  - temazepam (RESTORIL) 15 MG capsule; Take one capsule by mouth nightly as needed for sleep  Dispense: 30 capsule; Refill: 1  4. Unstable gait Fall precautions. No assistive device used.  5. Prediabetes Check a1c. Dietary counselling about portion control, cutting down on sweets.  - CMP; Future - Lipid Panel; Future - TSH; Future - Hemoglobin A1c; Future  6. Hyperlipidemia, unspecified hyperlipidemia type Continue fish oil for now.  - Lipid Panel; Future  7. Obesity (BMI 30-39.9) Diet and exercise counselling provided.   8. Vitamin D deficiency Continue vit d supplement, obtain dexa - calcium carbonate (OS-CAL) 600 MG tablet; Take 1 tablet (600 mg total) by mouth 2 (two) times daily with a meal.  Dispense: 30 tablet; Refill: 3  9. Postmenopausal - DG Bone Density; Future - calcium carbonate (OS-CAL) 600 MG tablet; Take 1 tablet (600 mg total) by mouth 2 (two) times daily with a meal.  Dispense: 30 tablet; Refill: 3    Labs/tests ordered:  As above  Next appointment: 6 months for physical with EKG  Communication: reviewed care plan with patient    Blanchie Serve, MD Internal  Medicine [facility] [place],  [address] Cell Phone (Monday-Friday 8 am -  5 pm): [phone] On Call: [phone] and follow prompts after 5 pm and on weekends Office Phone: [phone] Office Fax: [phone]

## 2017-02-03 ENCOUNTER — Telehealth: Payer: Self-pay | Admitting: Internal Medicine

## 2017-02-03 NOTE — Telephone Encounter (Signed)
I called the patient to schedule her AWV-I at [facility] clinic on 02/04/17, but there was no answer and no option to leave a message. VDM (DD)

## 2017-02-15 ENCOUNTER — Other Ambulatory Visit: Payer: Self-pay | Admitting: Internal Medicine

## 2017-02-15 DIAGNOSIS — E2839 Other primary ovarian failure: Secondary | ICD-10-CM

## 2017-03-21 ENCOUNTER — Non-Acute Institutional Stay: Payer: Medicare Other | Admitting: Internal Medicine

## 2017-03-21 ENCOUNTER — Encounter: Payer: Self-pay | Admitting: Internal Medicine

## 2017-03-21 VITALS — BP 124/64 | HR 90 | Temp 97.8°F | Resp 16 | Ht 60.0 in | Wt 156.6 lb

## 2017-03-21 DIAGNOSIS — F5101 Primary insomnia: Secondary | ICD-10-CM

## 2017-03-21 DIAGNOSIS — R8290 Unspecified abnormal findings in urine: Secondary | ICD-10-CM

## 2017-03-21 DIAGNOSIS — Z7189 Other specified counseling: Secondary | ICD-10-CM | POA: Diagnosis not present

## 2017-03-21 DIAGNOSIS — Z7185 Encounter for immunization safety counseling: Secondary | ICD-10-CM

## 2017-03-21 MED ORDER — PNEUMOCOCCAL VAC POLYVALENT 25 MCG/0.5ML IJ INJ: 0.5000 mL | INJECTION | INTRAMUSCULAR | 0 refills | Status: AC

## 2017-03-21 MED ORDER — TEMAZEPAM 15 MG PO CAPS: ORAL_CAPSULE | ORAL | 0 refills | Status: DC

## 2017-03-21 MED ORDER — TETANUS-DIPHTH-ACELL PERTUSSIS 5-2-15.5 LF-MCG/0.5 IM SUSP: 0.5000 mL | Freq: Once | INTRAMUSCULAR | 0 refills | Status: AC

## 2017-03-21 NOTE — Patient Instructions (Signed)
  I would encourage you to drink plenty of water to help with odor to your urine.   I have sent script for your tetanus and pneumococcal vaccine to your pharmacy.

## 2017-03-21 NOTE — Progress Notes (Signed)
[facility]  Provider: Blanchie Serve MD   Location:  [place] of Service:  Clinic (12)  PCP: Blanchie Serve, MD Patient Care Team: Blanchie Serve, MD as PCP - General (Internal Medicine)  Extended Emergency Contact Information Primary Emergency Contact: Harris,Margaret Address: [address]          [place], [place] [address] [name] of [place] Phone: [phone] Mobile Phone: [phone] Relation: Daughter   Goals of Care: Advanced Directive information Advanced Directives 06/29/2016  Does Patient Have a Medical Advance Directive? Yes  Type of Advance Directive [place] in Chart? Yes      Chief Complaint  Patient presents with  . Acute Visit    concern for urine infection with strong odor x 1 month  . Medication Refill    Temazepam pending     HPI: [name] a 84 y.o. female seen today for acute visit. She has been having strong odor to her urine for 1 month. Denies any burning or pain with urination.She would like refill on her temazepam.   Past Medical History:  Diagnosis Date  . Hearing loss   . History of fracture of clavicle   . Hypothyroidism   . Insomnia   . Tinnitus    Past Surgical History:  Procedure Laterality Date  . CHOLECYSTECTOMY  2007   Dr. Verdene Lennert  . CLAVICLE EXCISION  1986  . MOHS SURGERY     past 10 years chest & legs    reports that  has never smoked. she has never used smokeless tobacco. She reports that she drinks alcohol. She reports that she does not use drugs. Social History   Socioeconomic History  . Marital status: Widowed    Spouse name: Not on file  . Number of children: Not on file  . Years of education: Not on file  . Highest education level: Not on file  Social Needs  . Financial resource strain: Not on file  . Food insecurity - worry: Not on file  . Food insecurity - inability: Not on file  . Transportation needs  - medical: Not on file  . Transportation needs - non-medical: Not on file  Occupational History  . Occupation: retired Web designer  Tobacco Use  . Smoking status: Never Smoker  . Smokeless tobacco: Never Used  Substance and Sexual Activity  . Alcohol use: Yes    Comment: 5 per week  . Drug use: No  . Sexual activity: No  Other Topics Concern  . Not on file  Social History Narrative   Lives at [facility] since 12/23/14   Widowed   Never smoked   Alcohol 5 per week   Exercise 3 times a week classes, walking.    Activities Bridge, book groups   POA, Living Will   Still driving        Family History  Problem Relation Age of Onset  . Heart disease Mother   . Heart disease Father     Health Maintenance  Topic Date Due  . DEXA SCAN  08/20/1995  . PNA vac Low Risk Adult (2 of 2 - PPSV23) 11/08/2014  . TETANUS/TDAP  11/24/2026 (Originally 08/19/1947)  . INFLUENZA VACCINE  Completed    No Known Allergies  Outpatient Encounter Medications as of 03/21/2017  Medication Sig  . acetaminophen (TYLENOL) 500 MG tablet Take 1 tablet (500 mg total) by mouth every  6 (six) hours as needed for moderate pain.  . calcium carbonate (OS-CAL) 600 MG tablet Take 1 tablet (600 mg total) by mouth 2 (two) times daily with a meal.  . Cholecalciferol (VITAMIN D3) 2000 units TABS Take by mouth. Take one Vitamin D daily  . levothyroxine (SYNTHROID, LEVOTHROID) 75 MCG tablet Take 1 tablet (75 mcg total) by mouth daily before breakfast.  . MULTIPLE VITAMIN PO Take by mouth. Take one vitamin daily  . Omega-3 1400 MG CAPS Take by mouth. Take one tablet 5  times a week  . temazepam (RESTORIL) 15 MG capsule Take one capsule by mouth nightly as needed for sleep   No facility-administered encounter medications on file as of 03/21/2017.     Review of Systems  Constitutional: Negative for appetite change, chills, fatigue and fever.  Genitourinary: Negative for dysuria, flank pain, frequency,  hematuria, pelvic pain and vaginal discharge.       Wakes up one to two times at night to urinate, but this has been going on for several months  Psychiatric/Behavioral: Positive for sleep disturbance.    Vitals:   03/21/17 1404  BP: 124/64  Pulse: 90  Resp: 16  Temp: 97.8 F (36.6 C)  TempSrc: Oral  SpO2: 97%  Weight: 156 lb 9.6 oz (71 kg)  Height: 5' (1.524 m)   Body mass index is 30.58 kg/m. Physical Exam  Constitutional: She is oriented to person, place, and time. No distress.  Obese, in no distress  HENT:  Head: Normocephalic and atraumatic.  Mouth/Throat: Oropharynx is clear and moist.  Neck: Neck supple.  Abdominal: Soft. Bowel sounds are normal. She exhibits no distension and no mass. There is no tenderness. There is no rebound and no guarding.  No flank pain  Neurological: She is alert and oriented to person, place, and time.  Skin: Skin is warm and dry. She is not diaphoretic.  Psychiatric: She has a normal mood and affect.    Labs reviewed: Basic Metabolic Panel: Recent Labs    04/28/16 0001 12/31/16 0000  NA 139 138  K 4.2 4.4  CL 105 103  CO2 27 27  GLUCOSE 116* 132*  BUN 11 16  CREATININE 0.73 0.66  CALCIUM 8.7 8.8   Liver Function Tests: Recent Labs    04/28/16 0001 12/31/16 0000  AST 17 17  ALT 13 17  ALKPHOS 98  --   BILITOT 0.3 0.3  PROT 6.4 6.6  ALBUMIN 3.7  --    No results for input(s): LIPASE, AMYLASE in the last 8760 hours. No results for input(s): AMMONIA in the last 8760 hours. CBC: No results for input(s): WBC, NEUTROABS, HGB, HCT, MCV, PLT in the last 8760 hours. Cardiac Enzymes: No results for input(s): CKTOTAL, CKMB, CKMBINDEX, TROPONINI in the last 8760 hours. BNP: Invalid input(s): POCBNP Lab Results  Component Value Date   HGBA1C 6.2 (H) 12/31/2016   Lab Results  Component Value Date   TSH 5.72 (H) 12/31/2016   No results found for: VITAMINB12 No results found for: FOLATE No results found for: IRON, TIBC,  FERRITIN  Lipid Panel: Recent Labs    12/31/16 0000  CHOL 157  HDL 33*  TRIG 168*  CHOLHDL 4.8   Lab Results  Component Value Date   HGBA1C 6.2 (H) 12/31/2016    Procedures since last visit: No results found.  Assessment/Plan 1. Primary insomnia Refill provided - temazepam (RESTORIL) 15 MG capsule; Take one capsule by mouth nightly as needed for sleep  Dispense:  30 capsule; Refill: 1  2. Bad odor of urine Patient does not have signs or symptoms of urinary tract infection. Encouraged hydration for now. Supportive care  3. Vaccine counseling counselled on need for pneumococcal 23 vaccine and tdap. Pt agrees for vaccination. Script sent to [facility].      Labs/tests ordered:  Has pending labs in 2/19  Next appointment: has future f/u appointment  Communication: reviewed care plan with patient.     Blanchie Serve, MD Internal Medicine [facility] [address] [place], [place] [address] Cell Phone (Monday-Friday 8 am - 5 pm): [phone] On Call: [phone] and follow prompts after 5 pm and on weekends Office Phone: [phone] Office Fax: [phone]

## 2017-03-22 DIAGNOSIS — Z23 Encounter for immunization: Secondary | ICD-10-CM | POA: Diagnosis not present

## 2017-04-15 ENCOUNTER — Encounter: Payer: Self-pay | Admitting: Internal Medicine

## 2017-04-21 DIAGNOSIS — R7303 Prediabetes: Secondary | ICD-10-CM

## 2017-04-21 DIAGNOSIS — E785 Hyperlipidemia, unspecified: Secondary | ICD-10-CM

## 2017-04-21 DIAGNOSIS — E038 Other specified hypothyroidism: Secondary | ICD-10-CM

## 2017-04-22 LAB — COMPREHENSIVE METABOLIC PANEL
AG RATIO: 1.3 (calc) (ref 1.0–2.5)
ALBUMIN MSPROF: 3.7 g/dL (ref 3.6–5.1)
ALKALINE PHOSPHATASE (APISO): 103 U/L (ref 33–130)
ALT: 14 U/L (ref 6–29)
AST: 16 U/L (ref 10–35)
BILIRUBIN TOTAL: 0.3 mg/dL (ref 0.2–1.2)
BUN: 16 mg/dL (ref 7–25)
CALCIUM: 8.9 mg/dL (ref 8.6–10.4)
CHLORIDE: 105 mmol/L (ref 98–110)
CO2: 28 mmol/L (ref 20–32)
CREATININE: 0.65 mg/dL (ref 0.60–0.88)
GLOBULIN: 2.9 g/dL (ref 1.9–3.7)
Glucose, Bld: 125 mg/dL — ABNORMAL HIGH (ref 65–99)
POTASSIUM: 4.5 mmol/L (ref 3.5–5.3)
Sodium: 140 mmol/L (ref 135–146)
Total Protein: 6.6 g/dL (ref 6.1–8.1)

## 2017-04-22 LAB — LIPID PANEL
CHOLESTEROL: 169 mg/dL (ref <200)
HDL: 35 mg/dL — AB (ref >50)
LDL Cholesterol (Calc): 105 mg/dL (calc) — ABNORMAL HIGH
Non-HDL Cholesterol (Calc): 134 mg/dL (calc) — ABNORMAL HIGH (ref <130)
TRIGLYCERIDES: 176 mg/dL — AB (ref <150)
Total CHOL/HDL Ratio: 4.8 (calc) (ref <5.0)

## 2017-04-22 LAB — HEMOGLOBIN A1C
EAG (MMOL/L): 7.4 (calc)
Hgb A1c MFr Bld: 6.3 % of total Hgb — ABNORMAL HIGH (ref <5.7)
MEAN PLASMA GLUCOSE: 134 (calc)

## 2017-04-22 LAB — TSH: TSH: 1.97 mIU/L (ref 0.40–4.50)

## 2017-04-29 ENCOUNTER — Other Ambulatory Visit: Payer: Self-pay

## 2017-04-29 DIAGNOSIS — R7303 Prediabetes: Secondary | ICD-10-CM

## 2017-04-29 DIAGNOSIS — E785 Hyperlipidemia, unspecified: Secondary | ICD-10-CM

## 2017-05-30 ENCOUNTER — Telehealth: Payer: Self-pay | Admitting: *Deleted

## 2017-05-30 NOTE — Telephone Encounter (Signed)
Patient calling asking for referral to have her hearing checked, per the past referral info pt had appt with [facility] 08/2016 but pt no-showed and needs a new referral.

## 2017-05-30 NOTE — Telephone Encounter (Signed)
Spoke with the patient over the phone to let her know that her script for [facility] could be picked up when its convenient for her. Patient stated that she would come get the script now.

## 2017-05-30 NOTE — Telephone Encounter (Signed)
Script provided for referral. Patient to come to clinic and pick it up.

## 2017-05-31 ENCOUNTER — Non-Acute Institutional Stay: Payer: Medicare Other | Admitting: Family

## 2017-05-31 ENCOUNTER — Encounter: Payer: Self-pay | Admitting: Family

## 2017-05-31 VITALS — BP 150/76 | HR 96 | Resp 18 | Ht 60.0 in | Wt 156.0 lb

## 2017-05-31 DIAGNOSIS — R030 Elevated blood-pressure reading, without diagnosis of hypertension: Secondary | ICD-10-CM | POA: Diagnosis not present

## 2017-05-31 DIAGNOSIS — R42 Dizziness and giddiness: Secondary | ICD-10-CM

## 2017-05-31 NOTE — Patient Instructions (Addendum)
1.Please have your blood pressure checked in the Assisted Living three times per week on Monday,wednesday and Friday for 2 weeks then follow up at the clinic.  2. Drink fluid to stay hydrated. 3. Have lab work drawn on 06/01/2017 in the Assisted Living    Dizziness Dizziness is a common problem. It makes you feel unsteady or light-headed. You may feel like you are about to pass out (faint). Dizziness can lead to getting hurt if you stumble or fall. Dizziness can be caused by many things, including:  Medicines.  Not having enough water in your body (dehydration).  Illness.  Follow these instructions at home: Eating and drinking  Drink enough fluid to keep your pee (urine) clear or pale yellow. This helps to keep you from getting dehydrated. Try to drink more clear fluids, such as water.  Do not drink alcohol.  Limit how much caffeine you drink or eat, if your doctor tells you to do that.  Limit how much salt (sodium) you drink or eat, if your doctor tells you to do that. Activity  Avoid making quick movements. ? When you stand up from sitting in a chair, steady yourself until you feel okay. ? In the morning, first sit up on the side of the bed. When you feel okay, stand slowly while you hold onto something. Do this until you know that your balance is fine.  If you need to stand in one place for a long time, move your legs often. Tighten and relax the muscles in your legs while you are standing.  Do not drive or use heavy machinery if you feel dizzy.  Avoid bending down if you feel dizzy. Place items in your home so you can reach them easily without leaning over. Lifestyle  Do not use any products that contain nicotine or tobacco, such as cigarettes and e-cigarettes. If you need help quitting, ask your doctor.  Try to lower your stress level. You can do this by using methods such as yoga or meditation. Talk with your doctor if you need help. General instructions  Watch your  dizziness for any changes.  Take over-the-counter and prescription medicines only as told by your doctor. Talk with your doctor if you think that you are dizzy because of a medicine that you are taking.  Tell a friend or a family member that you are feeling dizzy. If he or she notices any changes in your behavior, have this person call your doctor.  Keep all follow-up visits as told by your doctor. This is important. Contact a doctor if:  Your dizziness does not go away.  Your dizziness or light-headedness gets worse.  You feel sick to your stomach (nauseous).  You have trouble hearing.  You have new symptoms.  You are unsteady on your feet.  You feel like the room is spinning. Get help right away if:  You throw up (vomit) or have watery poop (diarrhea), and you cannot eat or drink anything.  You have trouble: ? Talking. ? Walking. ? Swallowing. ? Using your arms, hands, or legs.  You feel generally weak.  You are not thinking clearly, or you have trouble forming sentences. A friend or family member may notice this.  You have: ? Chest pain. ? Pain in your belly (abdomen). ? Shortness of breath. ? Sweating.  Your vision changes.  You are bleeding.  You have a very bad headache.  You have neck pain or a stiff neck.  You have a fever. These  symptoms may be an emergency. Do not wait to see if the symptoms will go away. Get medical help right away. Call your local emergency services (911 in the [place] Do not drive yourself to the hospital. Summary  Dizziness makes you feel unsteady or light-headed. You may feel like you are about to pass out (faint).  Drink enough fluid to keep your pee (urine) clear or pale yellow. Do not drink alcohol.  Avoid making quick movements if you feel dizzy.  Watch your dizziness for any changes. This information is not intended to replace advice given to you by your health care provider. Make sure you discuss any questions you have  with your health care provider. Document Released: 02/04/2011 Document Revised: 03/04/2016 Document Reviewed: 03/04/2016 Elsevier Interactive Patient Education  2017 [employer].

## 2017-05-31 NOTE — Progress Notes (Signed)
Location:  [facility] (12) Provider: Toby Ayad FNP-C  Blanchie Serve, MD  Patient Care Team: Blanchie Serve, MD as PCP - General (Internal Medicine)  Extended Emergency Contact Information Primary Emergency Contact: Harris,Margaret Address: [address]          [place], [place] [address] [name] of [place] Phone: [phone] Mobile Phone: [phone] Relation: Daughter  Code Status:  DNR Goals of care: Advanced Directive information Advanced Directives 05/31/2017  Does Patient Have a Medical Advance Directive? No  Type of Advance Directive -  Copy of [place] in Chart? -     Chief Complaint  Patient presents with  . Acute Visit    dizziness since yesterday--caused her to fall yesterday;mostly happens when upright    HPI:  Pt is a 84 y.o. female seen today at [facility] clinic for an acute visit for evaluation of dizziness.she complains of dizziness x 1 day.she was trying to walk from her chair when she felt dizziness and fell on the floor.she denies any injuries,  Pain or hitting her head.she was able to get up and walk without any difficulties.she states has been worried about her dizziness wonders what could be causing it. She denies any fever,chills,cough,nausea,vomiting,headache or visual changes.she does wear contact lens. Her B/p upon arrival to clinic was 160/84 blood pressure rechecked after resting Left arm 150/76,HR 96; Right arm 154/70,HR 96.   Past Medical History:  Diagnosis Date  . Hearing loss   . History of fracture of clavicle   . Hypothyroidism   . Insomnia   . Tinnitus    Past Surgical History:  Procedure Laterality Date  . CHOLECYSTECTOMY  2007   Dr. Verdene Lennert  . CLAVICLE EXCISION  1986  . MOHS SURGERY     past 10 years chest & legs    No Known Allergies  Outpatient Encounter Medications as of 05/31/2017  Medication Sig  . calcium carbonate (OS-CAL) 600 MG tablet Take 1  tablet (600 mg total) by mouth 2 (two) times daily with a meal.  . Cholecalciferol (VITAMIN D3) 2000 units TABS Take by mouth. Take one Vitamin D daily  . levothyroxine (SYNTHROID, LEVOTHROID) 75 MCG tablet Take 1 tablet (75 mcg total) by mouth daily before breakfast.  . MULTIPLE VITAMIN PO Take by mouth. Take one vitamin daily  . Omega-3 1400 MG CAPS Take by mouth. Take one tablet 5  times a week  . acetaminophen (TYLENOL) 500 MG tablet Take 1 tablet (500 mg total) by mouth every 6 (six) hours as needed for moderate pain. (Patient not taking: Reported on 05/31/2017)  . temazepam (RESTORIL) 15 MG capsule Take one capsule by mouth nightly as needed for sleep (Patient not taking: Reported on 05/31/2017)   No facility-administered encounter medications on file as of 05/31/2017.     Review of Systems  Constitutional: Negative for appetite change, chills, fatigue and fever.  HENT: Positive for hearing loss. Negative for congestion, rhinorrhea, sinus pressure, sinus pain and sore throat.        Left ear hearing aid   Eyes: Negative for discharge, redness and itching.       Wears contact lens   Respiratory: Negative for cough, chest tightness, shortness of breath and wheezing.   Cardiovascular: Negative for chest pain, palpitations and leg swelling.  Gastrointestinal: Negative for abdominal distention, abdominal pain, constipation, diarrhea, nausea and vomiting.  Endocrine: Negative for cold intolerance, heat intolerance, polydipsia, polyphagia and polyuria.  Genitourinary: Negative for dysuria, frequency and urgency.  Musculoskeletal: Positive for arthralgias and gait problem.       Chronic left hip pain controlled with tylenol   Skin: Negative for color change, pallor and rash.  Neurological: Negative for tremors, seizures, syncope, weakness and headaches.       Dizziness x 1 day   Hematological: Does not bruise/bleed easily.  Psychiatric/Behavioral: Negative for agitation, confusion and sleep  disturbance.    Immunization History  Administered Date(s) Administered  . DTaP 07/20/2013  . Influenza, High Dose Seasonal PF 12/07/2016  . Influenza-Unspecified 11/20/2014, 12/12/2015  . PPD Test 08/06/2014  . Pneumococcal Conjugate-13 11/07/2013  . Zoster 03/01/2006   Pertinent  Health Maintenance Due  Topic Date Due  . DEXA SCAN  08/20/1995  . PNA vac Low Risk Adult (2 of 2 - PPSV23) 11/08/2014  . INFLUENZA VACCINE  09/29/2017   Fall Risk  01/19/2017 11/23/2016 09/09/2015  Falls in the past year? No No No    Vitals:   05/31/17 1451  BP: (!) 160/84  Pulse: (!) 112  Resp: 18  SpO2: 90%  Weight: 156 lb (70.8 kg)  Height: 5' (1.524 m)   Body mass index is 30.47 kg/m. Physical Exam  Constitutional: She is oriented to person, place, and time. She appears well-developed and well-nourished. No distress.  HENT:  Head: Normocephalic.  Right Ear: External ear normal.  Left Ear: External ear normal.  Mouth/Throat: Oropharynx is clear and moist. No oropharyngeal exudate.  Left ear hearing aid in place   Eyes: Pupils are equal, round, and reactive to light. Conjunctivae and EOM are normal. Right eye exhibits no discharge. Left eye exhibits no discharge. No scleral icterus.  Neck: Normal range of motion. No JVD present. No tracheal deviation present. No thyromegaly present.  Cardiovascular: Normal rate, regular rhythm, normal heart sounds and intact distal pulses. Exam reveals no gallop and no friction rub.  No murmur heard. Pulmonary/Chest: Effort normal and breath sounds normal. No respiratory distress. She has no wheezes. She has no rales.  Abdominal: Soft. Bowel sounds are normal. She exhibits no distension. There is no tenderness. There is no rebound and no guarding.  Musculoskeletal: She exhibits no edema or tenderness.  Moves x 4 extremities.   Lymphadenopathy:    She has no cervical adenopathy.  Neurological: She is oriented to person, place, and time. Coordination  normal.  Skin: Skin is warm and dry. No rash noted. No erythema. No pallor.  Psychiatric: She has a normal mood and affect.    Labs reviewed: Recent Labs    12/31/16 0000 04/21/17 0750  NA 138 140  K 4.4 4.5  CL 103 105  CO2 27 28  GLUCOSE 132* 125*  BUN 16 16  CREATININE 0.66 0.65  CALCIUM 8.8 8.9   Recent Labs    12/31/16 0000 04/21/17 0750  AST 17 16  ALT 17 14  BILITOT 0.3 0.3  PROT 6.6 6.6    Lab Results  Component Value Date   TSH 1.97 04/21/2017   Lab Results  Component Value Date   HGBA1C 6.3 (H) 04/21/2017   Lab Results  Component Value Date   CHOL 169 04/21/2017   HDL 35 (L) 04/21/2017   LDLCALC 105 (H) 04/21/2017   TRIG 176 (H) 04/21/2017   CHOLHDL 4.8 04/21/2017    Significant Diagnostic Results in last 30 days:  No results found.  Assessment/Plan 1. Dizziness Afebrile.Had dizziness one day ago but no symptoms today.suspect possible elevated blood pressure,vertigo  verse viral etiologies.Asymptomatic during visit. Encouraged to drink fluid for hydration.Recommended PT for dizziness but patient declined. - CBC with Differential/Platelets 06/01/2017  2. Elevated blood pressure reading B/p upon arrival to clinic was 160/84 blood pressure rechecked after resting Left arm 150/76,HR 96; Right arm 154/70,HR 96.denies any chest pain or headache.will have her check blood pressure three times per week on Monday,wednesday and Friday at the assisted Living facility and record. Will follow up in 2 weeks.Encouraged to bring blood pressure log readings to follow up visit.Obtain CBC with Differential/Platelets 06/01/2017.   3. Fall episode Reports falling yesterday 05/31/2017 due to dizziness.No injuries sustained.Recommended PT for gait training and dizziness but patient declined.    Family/ staff Communication: Reviewed plan of care with patient.Follow up in 2 weeks for blood pressure check.  Labs/tests ordered: None   Jj Enyeart C Jaycee Pelzer, NP

## 2017-06-01 DIAGNOSIS — R7303 Prediabetes: Secondary | ICD-10-CM | POA: Diagnosis not present

## 2017-06-01 DIAGNOSIS — R42 Dizziness and giddiness: Secondary | ICD-10-CM | POA: Diagnosis not present

## 2017-06-01 DIAGNOSIS — R030 Elevated blood-pressure reading, without diagnosis of hypertension: Secondary | ICD-10-CM | POA: Diagnosis not present

## 2017-06-01 DIAGNOSIS — E785 Hyperlipidemia, unspecified: Secondary | ICD-10-CM | POA: Diagnosis not present

## 2017-06-02 ENCOUNTER — Other Ambulatory Visit: Payer: Medicare Other

## 2017-06-02 ENCOUNTER — Telehealth: Payer: Self-pay

## 2017-06-02 DIAGNOSIS — R030 Elevated blood-pressure reading, without diagnosis of hypertension: Secondary | ICD-10-CM

## 2017-06-02 DIAGNOSIS — R7303 Prediabetes: Secondary | ICD-10-CM

## 2017-06-02 DIAGNOSIS — E785 Hyperlipidemia, unspecified: Secondary | ICD-10-CM

## 2017-06-02 DIAGNOSIS — R42 Dizziness and giddiness: Secondary | ICD-10-CM

## 2017-06-02 NOTE — Telephone Encounter (Signed)
Patient no showed at [facility] yesterday at [facility]. I called to see if she wanted to reschedule for tomorrow at 1:15pm. There was no answer so I left a voicemail asking her to call back and confirm

## 2017-06-03 ENCOUNTER — Ambulatory Visit: Payer: Medicare Other

## 2017-06-03 VITALS — BP 135/80 | HR 86 | Temp 97.5°F | Ht 60.0 in | Wt 157.0 lb

## 2017-06-03 DIAGNOSIS — Z0000 Encounter for general adult medical examination without abnormal findings: Secondary | ICD-10-CM | POA: Diagnosis not present

## 2017-06-03 LAB — COMPREHENSIVE METABOLIC PANEL
AG RATIO: 1.3 (calc) (ref 1.0–2.5)
ALKALINE PHOSPHATASE (APISO): 104 U/L (ref 33–130)
ALT: 13 U/L (ref 6–29)
AST: 16 U/L (ref 10–35)
Albumin: 3.9 g/dL (ref 3.6–5.1)
BUN: 17 mg/dL (ref 7–25)
CALCIUM: 9.3 mg/dL (ref 8.6–10.4)
CHLORIDE: 104 mmol/L (ref 98–110)
CO2: 28 mmol/L (ref 20–32)
Creat: 0.84 mg/dL (ref 0.60–0.88)
GLOBULIN: 2.9 g/dL (ref 1.9–3.7)
Glucose, Bld: 128 mg/dL — ABNORMAL HIGH (ref 65–99)
Potassium: 4.4 mmol/L (ref 3.5–5.3)
Sodium: 140 mmol/L (ref 135–146)
Total Bilirubin: 0.3 mg/dL (ref 0.2–1.2)
Total Protein: 6.8 g/dL (ref 6.1–8.1)

## 2017-06-03 LAB — CBC WITH DIFFERENTIAL/PLATELET
BASOS PCT: 0.7 %
Basophils Absolute: 74 cells/uL (ref 0–200)
EOS PCT: 4 %
Eosinophils Absolute: 420 cells/uL (ref 15–500)
HEMATOCRIT: 40.4 % (ref 35.0–45.0)
Hemoglobin: 13 g/dL (ref 11.7–15.5)
LYMPHS ABS: 2541 {cells}/uL (ref 850–3900)
MCH: 25.5 pg — ABNORMAL LOW (ref 27.0–33.0)
MCHC: 32.2 g/dL (ref 32.0–36.0)
MCV: 79.2 fL — ABNORMAL LOW (ref 80.0–100.0)
MPV: 9.7 fL (ref 7.5–12.5)
Monocytes Relative: 7.5 %
Neutro Abs: 6678 cells/uL (ref 1500–7800)
Neutrophils Relative %: 63.6 %
PLATELETS: 328 10*3/uL (ref 140–400)
RBC: 5.1 10*6/uL (ref 3.80–5.10)
RDW: 13.7 % (ref 11.0–15.0)
TOTAL LYMPHOCYTE: 24.2 %
WBC mixed population: 788 cells/uL (ref 200–950)
WBC: 10.5 10*3/uL (ref 3.8–10.8)

## 2017-06-03 LAB — HEMOGLOBIN A1C
HEMOGLOBIN A1C: 6.3 %{Hb} — AB (ref <5.7)
Mean Plasma Glucose: 134 (calc)
eAG (mmol/L): 7.4 (calc)

## 2017-06-03 LAB — LIPID PANEL
Cholesterol: 159 mg/dL (ref <200)
HDL: 35 mg/dL — AB (ref >50)
LDL Cholesterol (Calc): 92 mg/dL (calc)
Non-HDL Cholesterol (Calc): 124 mg/dL (calc) (ref <130)
TRIGLYCERIDES: 220 mg/dL — AB (ref <150)
Total CHOL/HDL Ratio: 4.5 (calc) (ref <5.0)

## 2017-06-03 MED ORDER — PNEUMOCOCCAL VAC POLYVALENT 25 MCG/0.5ML IJ INJ: 0.5000 mL | INJECTION | INTRAMUSCULAR | 0 refills | Status: AC

## 2017-06-03 MED ORDER — ZOSTER VAC RECOMB ADJUVANTED 50 MCG/0.5ML IM SUSR: 0.5000 mL | Freq: Once | INTRAMUSCULAR | 1 refills | Status: AC

## 2017-06-03 NOTE — Progress Notes (Signed)
Subjective:   Lisa Castillo is a 84 y.o. female who presents for an Initial Medicare Annual Wellness Visit at [facility]        Objective:    Today's Vitals   06/03/17 1339  BP: 135/80  Pulse: 86  Temp: (!) 97.5 F (36.4 C)  TempSrc: Oral  SpO2: 98%  Weight: 157 lb (71.2 kg)  Height: 5' (1.524 m)   Body mass index is 30.66 kg/m.  Advanced Directives 06/03/2017 05/31/2017 06/29/2016 04/27/2016 09/09/2015  Does Patient Have a Medical Advance Directive? Yes No Yes Yes Yes  Type of Product manager Power of [employer] Power of [place];Living will  Does patient want to make changes to medical advance directive? No - Patient declined - - - -  Copy of [place] in Chart? No - copy requested - Yes Yes No - copy requested    Current Medications (verified) Outpatient Encounter Medications as of 06/03/2017  Medication Sig  . acetaminophen (TYLENOL) 500 MG tablet Take 1 tablet (500 mg total) by mouth every 6 (six) hours as needed for moderate pain.  . calcium carbonate (OS-CAL) 600 MG tablet Take 1 tablet (600 mg total) by mouth 2 (two) times daily with a meal.  . Cholecalciferol (VITAMIN D3) 2000 units TABS Take by mouth. Take one Vitamin D daily  . levothyroxine (SYNTHROID, LEVOTHROID) 75 MCG tablet Take 1 tablet (75 mcg total) by mouth daily before breakfast.  . MULTIPLE VITAMIN PO Take by mouth. Take one vitamin daily  . Omega-3 1400 MG CAPS Take by mouth. Take one tablet 5  times a week  . temazepam (RESTORIL) 15 MG capsule Take one capsule by mouth nightly as needed for sleep   No facility-administered encounter medications on file as of 06/03/2017.     Allergies (verified) Patient has no known allergies.   History: Past Medical History:  Diagnosis Date  . Hearing loss   . History of fracture of clavicle   . Hypothyroidism   . Insomnia   .  Tinnitus    Past Surgical History:  Procedure Laterality Date  . CHOLECYSTECTOMY  2007   Dr. Verdene Lennert  . CLAVICLE EXCISION  1986  . MOHS SURGERY     past 10 years chest & legs   Family History  Problem Relation Age of Onset  . Heart disease Mother   . Heart disease Father    Social History   Socioeconomic History  . Marital status: Widowed    Spouse name: Not on file  . Number of children: Not on file  . Years of education: Not on file  . Highest education level: Not on file  Occupational History  . Occupation: retired Web designer  Social Needs  . Financial resource strain: Not hard at all  . Food insecurity:    Worry: Never true    Inability: Never true  . Transportation needs:    Medical: No    Non-medical: No  Tobacco Use  . Smoking status: Never Smoker  . Smokeless tobacco: Never Used  Substance and Sexual Activity  . Alcohol use: Yes    Comment: 4 per week  . Drug use: No  . Sexual activity: Never  Lifestyle  . Physical activity:    Days per week: 7 days    Minutes per session: 30 min  . Stress: Only a little  Relationships  . Social connections:  Talks on phone: More than three times a week    Gets together: More than three times a week    Attends religious service: More than 4 times per year    Active member of club or organization: No    Attends meetings of clubs or organizations: Never    Relationship status: Widowed  Other Topics Concern  . Not on file  Social History Narrative   Lives at [facility] since 12/23/14   Widowed   Never smoked   Alcohol 5 per week   Exercise 3 times a week classes, walking.    Activities [place], book groups   POA, Living Will   Still driving       Tobacco Counseling Counseling given: Not Answered   Clinical Intake:  Pre-visit preparation completed: No  Pain : No/denies pain     Nutritional Risks: None Diabetes: No     Interpreter Needed?: No  Information entered by :: Tyson Dense,  RN   Activities of Daily Living In your present state of health, do you have any difficulty performing the following activities: 06/03/2017  Hearing? N  Vision? N  Difficulty concentrating or making decisions? Y  Walking or climbing stairs? N  Dressing or bathing? N  Doing errands, shopping? N  Preparing Food and eating ? N  Using the Toilet? N  In the past six months, have you accidently leaked urine? N  Do you have problems with loss of bowel control? N  Managing your Medications? N  Managing your Finances? N  Housekeeping or managing your Housekeeping? N  Some recent data might be hidden     Immunizations and Health Maintenance Immunization History  Administered Date(s) Administered  . DTaP 07/20/2013  . Influenza, High Dose Seasonal PF 12/07/2016  . Influenza-Unspecified 11/20/2014, 12/12/2015  . PPD Test 08/06/2014  . Pneumococcal Conjugate-13 11/07/2013  . Zoster 03/01/2006   Health Maintenance Due  Topic Date Due  . DEXA SCAN  08/20/1995  . PNA vac Low Risk Adult (2 of 2 - PPSV23) 11/08/2014    Patient Care Team: Blanchie Serve, MD as PCP - General (Internal Medicine)  Indicate any recent Medical Services you may have received from other than [facility] providers in the past year (date may be approximate).     Assessment:   This is a routine wellness examination for Lisa Castillo.  Hearing/Vision screen Hearing Screening Comments: Bilateral hearing aids Vision Screening Comments: Pt does not see eye doctor reguarly  Dietary issues and exercise activities discussed: Current Exercise Habits: Structured exercise class, Type of exercise: Other - see comments;walking(FHW classes), Time (Minutes): 30, Frequency (Times/Week): 7, Weekly Exercise (Minutes/Week): 210, Intensity: Mild  Goals    None     Depression Screen PHQ 2/9 Scores 06/03/2017 01/19/2017 11/23/2016 09/09/2015  PHQ - 2 Score 0 0 0 0    Fall Risk Fall Risk  06/03/2017 01/19/2017 11/23/2016 09/09/2015  Falls in  the past year? Yes No No No  Number falls in past yr: 1 - - -  Injury with Fall? No - - -    Is the patient's home free of loose throw rugs in walkways, pet beds, electrical cords, etc?   yes      Grab bars in the bathroom? yes      Handrails on the stairs?   yes      Adequate lighting?   yes  Cognitive Function: MMSE - Mini Mental State Exam 01/19/2017  Not completed: (No Data)  Orientation to time  5  Orientation to Place 5  Registration 3  Attention/ Calculation 5  Recall 3  Language- name 2 objects 2  Language- repeat 1  Language- follow 3 step command 3  Language- read & follow direction 1  Write a sentence 1  Copy design 1  Total score 30        Screening Tests Health Maintenance  Topic Date Due  . DEXA SCAN  08/20/1995  . PNA vac Low Risk Adult (2 of 2 - PPSV23) 11/08/2014  . TETANUS/TDAP  11/24/2026 (Originally 08/19/1947)  . INFLUENZA VACCINE  09/29/2017    Qualifies for Shingles Vaccine? Yes, educated and prescription sent to pharmacy  Cancer Screenings: Lung: Low Dose CT Chest recommended if Age 19-80 years, 30 pack-year currently smoking OR have quit w/in 15years. Patient does not qualify. Breast: Up to date on Mammogram? Yes   Up to date of Bone Density/Dexa? No, ordered at previous provider visit Colorectal: up to date  Additional Screenings: Hepatitis C Screening: declined PNA 23 due: prescription sent to pharmacy TDAP due: declined     Plan:  I have personally reviewed and addressed the Medicare Annual Wellness questionnaire and have noted the following in the patient's chart:  A. Medical and social history B. Use of alcohol, tobacco or illicit drugs  C. Current medications and supplements D. Functional ability and status E.  Nutritional status F.  Physical activity G. Advance directives H. List of other physicians I.  Hospitalizations, surgeries, and ER visits in previous 12 months J.  [place] to include hearing, vision,  cognitive, depression L. Referrals and appointments - none  In addition, I have reviewed and discussed with patient certain preventive protocols, quality metrics, and best practice recommendations. A written personalized care plan for preventive services as well as general preventive health recommendations were provided to patient.  See attached scanned questionnaire for additional information.   Signed,   Tyson Dense, RN Nurse Health Advisor  Patient Concerns: dizziness intermittently since tuesday

## 2017-06-03 NOTE — Patient Instructions (Signed)
Lisa Castillo , Thank you for taking time to come for your Medicare Wellness Visit. I appreciate your ongoing commitment to your health goals. Please review the following plan we discussed and let me know if I can assist you in the future.   Screening recommendations/referrals: Colonoscopy excluded, you are over age 82 Mammogram excluded, you are over age 82 Bone Density ordered at previous visit Recommended yearly ophthalmology/optometry visit for glaucoma screening and checkup Recommended yearly dental visit for hygiene and checkup  Vaccinations: Influenza vaccine up to date, due  Pneumococcal vaccine 23 possibly due-we will check with your pharmacy Tdap vaccine due, declined Shingles vaccine due, prescription sent to pharmacy    Advanced directives: in chart  Conditions/risks identified: none  Next appointment: [name] 06/14/2017 @ 1:30pm   Preventive Care 82 Years and Older, Female Preventive care refers to lifestyle choices and visits with your health care provider that can promote health and wellness. What does preventive care include?  A yearly physical exam. This is also called an annual well check.  Dental exams once or twice a year.  Routine eye exams. Ask your health care provider how often you should have your eyes checked.  Personal lifestyle choices, including:  Daily care of your teeth and gums.  Regular physical activity.  Eating a healthy diet.  Avoiding tobacco and drug use.  Limiting alcohol use.  Practicing safe sex.  Taking low-dose aspirin every day.  Taking vitamin and mineral supplements as recommended by your health care provider. What happens during an annual well check? The services and screenings done by your health care provider during your annual well check will depend on your age, overall health, lifestyle risk factors, and family history of disease. Counseling  Your health care provider may ask you questions about your:  Alcohol  use.  Tobacco use.  Drug use.  Emotional well-being.  Home and relationship well-being.  Sexual activity.  Eating habits.  History of falls.  Memory and ability to understand (cognition).  Work and work Statistician.  Reproductive health. Screening  You may have the following tests or measurements:  Height, weight, and BMI.  Blood pressure.  Lipid and cholesterol levels. These may be checked every 5 years, or more frequently if you are over 41 years old.  Skin check.  Lung cancer screening. You may have this screening every year starting at age 82 if you have a 30-pack-year history of smoking and currently smoke or have quit within the past 15 years.  Fecal occult blood test (FOBT) of the stool. You may have this test every year starting at age 82.  Flexible sigmoidoscopy or colonoscopy. You may have a sigmoidoscopy every 5 years or a colonoscopy every 10 years starting at age 82.  Hepatitis C blood test.  Hepatitis B blood test.  Sexually transmitted disease (STD) testing.  Diabetes screening. This is done by checking your blood sugar (glucose) after you have not eaten for a while (fasting). You may have this done every 1-3 years.  Bone density scan. This is done to screen for osteoporosis. You may have this done starting at age 82.  Mammogram. This may be done every 1-2 years. Talk to your health care provider about how often you should have regular mammograms. Talk with your health care provider about your test results, treatment options, and if necessary, the need for more tests. Vaccines  Your health care provider may recommend certain vaccines, such as:  Influenza vaccine. This is recommended every year.  Tetanus,  diphtheria, and acellular pertussis (Tdap, Td) vaccine. You may need a Td booster every 10 years.  Zoster vaccine. You may need this after age 37.  Pneumococcal 13-valent conjugate (PCV13) vaccine. One dose is recommended after age  82.  Pneumococcal polysaccharide (PPSV23) vaccine. One dose is recommended after age 82. Talk to your health care provider about which screenings and vaccines you need and how often you need them. This information is not intended to replace advice given to you by your health care provider. Make sure you discuss any questions you have with your health care provider. Document Released: 03/14/2015 Document Revised: 11/05/2015 Document Reviewed: 12/17/2014 Elsevier Interactive Patient Education  2017 [place] Prevention in the Home Falls can cause injuries. They can happen to people of all ages. There are many things you can do to make your home safe and to help prevent falls. What can I do on the outside of my home?  Regularly fix the edges of walkways and driveways and fix any cracks.  Remove anything that might make you trip as you walk through a door, such as a raised step or threshold.  Trim any bushes or trees on the path to your home.  Use bright outdoor lighting.  Clear any walking paths of anything that might make someone trip, such as rocks or tools.  Regularly check to see if handrails are loose or broken. Make sure that both sides of any steps have handrails.  Any raised decks and porches should have guardrails on the edges.  Have any leaves, snow, or ice cleared regularly.  Use sand or salt on walking paths during winter.  Clean up any spills in your garage right away. This includes oil or grease spills. What can I do in the bathroom?  Use night lights.  Install grab bars by the toilet and in the tub and shower. Do not use towel bars as grab bars.  Use non-skid mats or decals in the tub or shower.  If you need to sit down in the shower, use a plastic, non-slip stool.  Keep the floor dry. Clean up any water that spills on the floor as soon as it happens.  Remove soap buildup in the tub or shower regularly.  Attach bath mats securely with double-sided  non-slip rug tape.  Do not have throw rugs and other things on the floor that can make you trip. What can I do in the bedroom?  Use night lights.  Make sure that you have a light by your bed that is easy to reach.  Do not use any sheets or blankets that are too big for your bed. They should not hang down onto the floor.  Have a firm chair that has side arms. You can use this for support while you get dressed.  Do not have throw rugs and other things on the floor that can make you trip. What can I do in the kitchen?  Clean up any spills right away.  Avoid walking on wet floors.  Keep items that you use a lot in easy-to-reach places.  If you need to reach something above you, use a strong step stool that has a grab bar.  Keep electrical cords out of the way.  Do not use floor polish or wax that makes floors slippery. If you must use wax, use non-skid floor wax.  Do not have throw rugs and other things on the floor that can make you trip. What can I do with  my stairs?  Do not leave any items on the stairs.  Make sure that there are handrails on both sides of the stairs and use them. Fix handrails that are broken or loose. Make sure that handrails are as long as the stairways.  Check any carpeting to make sure that it is firmly attached to the stairs. Fix any carpet that is loose or worn.  Avoid having throw rugs at the top or bottom of the stairs. If you do have throw rugs, attach them to the floor with carpet tape.  Make sure that you have a light switch at the top of the stairs and the bottom of the stairs. If you do not have them, ask someone to add them for you. What else can I do to help prevent falls?  Wear shoes that:  Do not have high heels.  Have rubber bottoms.  Are comfortable and fit you well.  Are closed at the toe. Do not wear sandals.  If you use a stepladder:  Make sure that it is fully opened. Do not climb a closed stepladder.  Make sure that both  sides of the stepladder are locked into place.  Ask someone to hold it for you, if possible.  Clearly mark and make sure that you can see:  Any grab bars or handrails.  First and last steps.  Where the edge of each step is.  Use tools that help you move around (mobility aids) if they are needed. These include:  Canes.  Walkers.  Scooters.  Crutches.  Turn on the lights when you go into a dark area. Replace any light bulbs as soon as they burn out.  Set up your furniture so you have a clear path. Avoid moving your furniture around.  If any of your floors are uneven, fix them.  If there are any pets around you, be aware of where they are.  Review your medicines with your doctor. Some medicines can make you feel dizzy. This can increase your chance of falling. Ask your doctor what other things that you can do to help prevent falls. This information is not intended to replace advice given to you by your health care provider. Make sure you discuss any questions you have with your health care provider. Document Released: 12/12/2008 Document Revised: 07/24/2015 Document Reviewed: 03/22/2014 Elsevier Interactive Patient Education  2017 [employer].

## 2017-06-09 DIAGNOSIS — R42 Dizziness and giddiness: Secondary | ICD-10-CM | POA: Diagnosis not present

## 2017-06-14 ENCOUNTER — Encounter: Payer: Self-pay | Admitting: Family

## 2017-06-14 ENCOUNTER — Non-Acute Institutional Stay: Payer: Medicare Other | Admitting: Family

## 2017-06-14 VITALS — BP 140/84 | HR 70 | Temp 98.2°F | Resp 16 | Ht 60.0 in | Wt 157.8 lb

## 2017-06-14 DIAGNOSIS — R030 Elevated blood-pressure reading, without diagnosis of hypertension: Secondary | ICD-10-CM

## 2017-06-14 NOTE — Patient Instructions (Signed)
1.continue with exercise,low carbo, low fat diet and drink plenty of fluid. 2.Check your  blood pressure twice per week at the Assisted Living.Notify provide's office if Blood pressure is greater than 150/90 three consecutive times.  Hypertension Hypertension is another name for high blood pressure. High blood pressure forces your heart to work harder to pump blood. This can cause problems over time. There are two numbers in a blood pressure reading. There is a top number (systolic) over a bottom number (diastolic). It is best to have a blood pressure below 120/80. Healthy choices can help lower your blood pressure. You may need medicine to help lower your blood pressure if:  Your blood pressure cannot be lowered with healthy choices.  Your blood pressure is higher than 130/80.  Follow these instructions at home: Eating and drinking  If directed, follow the DASH eating plan. This diet includes: ? Filling half of your plate at each meal with fruits and vegetables. ? Filling one quarter of your plate at each meal with whole grains. Whole grains include whole wheat pasta, brown rice, and whole grain bread. ? Eating or drinking low-fat dairy products, such as skim milk or low-fat yogurt. ? Filling one quarter of your plate at each meal with low-fat (lean) proteins. Low-fat proteins include fish, skinless chicken, eggs, beans, and tofu. ? Avoiding fatty meat, cured and processed meat, or chicken with skin. ? Avoiding premade or processed food.  Eat less than 1,500 mg of salt (sodium) a day.  Limit alcohol use to no more than 1 drink a day for nonpregnant women and 2 drinks a day for men. One drink equals 12 oz of beer, 5 oz of wine, or 1 oz of hard liquor. Lifestyle  Work with your doctor to stay at a healthy weight or to lose weight. Ask your doctor what the best weight is for you.  Get at least 30 minutes of exercise that causes your heart to beat faster (aerobic exercise) most days of the  week. This may include walking, swimming, or biking.  Get at least 30 minutes of exercise that strengthens your muscles (resistance exercise) at least 3 days a week. This may include lifting weights or pilates.  Do not use any products that contain nicotine or tobacco. This includes cigarettes and e-cigarettes. If you need help quitting, ask your doctor.  Check your blood pressure at home as told by your doctor.  Keep all follow-up visits as told by your doctor. This is important. Medicines  Take over-the-counter and prescription medicines only as told by your doctor. Follow directions carefully.  Do not skip doses of blood pressure medicine. The medicine does not work as well if you skip doses. Skipping doses also puts you at risk for problems.  Ask your doctor about side effects or reactions to medicines that you should watch for. Contact a doctor if:  You think you are having a reaction to the medicine you are taking.  You have headaches that keep coming back (recurring).  You feel dizzy.  You have swelling in your ankles.  You have trouble with your vision. Get help right away if:  You get a very bad headache.  You start to feel confused.  You feel weak or numb.  You feel faint.  You get very bad pain in your: ? Chest. ? Belly (abdomen).  You throw up (vomit) more than once.  You have trouble breathing. Summary  Hypertension is another name for high blood pressure.  Making healthy  choices can help lower blood pressure. If your blood pressure cannot be controlled with healthy choices, you may need to take medicine. This information is not intended to replace advice given to you by your health care provider. Make sure you discuss any questions you have with your health care provider. Document Released: 08/04/2007 Document Revised: 01/14/2016 Document Reviewed: 01/14/2016 Elsevier Interactive Patient Education  [employer].

## 2017-06-14 NOTE — Progress Notes (Signed)
Location:  [facility] (12) Provider: Jackie Russman FNP-C  Blanchie Serve, MD  Patient Care Team: Blanchie Serve, MD as PCP - General (Internal Medicine)  Extended Emergency Contact Information Primary Emergency Contact: Harris,Margaret Address: [address]          [place], [place] [address] [name] of [place] Phone: [phone] Mobile Phone: [phone] Relation: Daughter  Code Status:  DNR Goals of care: Advanced Directive information Advanced Directives 06/14/2017  Does Patient Have a Medical Advance Directive? Yes  Type of Advance Directive [place]  Does patient want to make changes to medical advance directive? -  Copy of [place] in Chart? No - copy requested     Chief Complaint  Patient presents with  . Follow-up    2 weeks for dizziness and BP    HPI:  Pt is a 84 y.o. female seen today at [facility] clinic for an acute visit for follow up dizziness and high blood pressure.she brought her recent blood pressure reading log: B/p 149/72,140/84 and 161/86.she denies any dizziness,headache,chest pain or shortness of breath.Her blood pressure today 140/84.she states attends facility exercise classes and also walks around the facility.she is also conscious of what she eats.she states gets anxious at times but not often.        Past Medical History:  Diagnosis Date  . Hearing loss   . History of fracture of clavicle   . Hypothyroidism   . Insomnia   . Tinnitus    Past Surgical History:  Procedure Laterality Date  . CHOLECYSTECTOMY  2007   Dr. Verdene Lennert  . CLAVICLE EXCISION  1986  . MOHS SURGERY     past 10 years chest & legs    No Known Allergies  Outpatient Encounter Medications as of 06/14/2017  Medication Sig  . acetaminophen (TYLENOL) 500 MG tablet Take 1 tablet (500 mg total) by mouth every 6 (six) hours as needed for moderate pain.  . calcium carbonate (OS-CAL) 600  MG tablet Take 1 tablet (600 mg total) by mouth 2 (two) times daily with a meal.  . Cholecalciferol (VITAMIN D3) 2000 units TABS Take by mouth. Take one Vitamin D daily  . levothyroxine (SYNTHROID, LEVOTHROID) 75 MCG tablet Take 1 tablet (75 mcg total) by mouth daily before breakfast.  . MULTIPLE VITAMIN PO Take by mouth. Take one vitamin daily  . Omega-3 1400 MG CAPS Take by mouth. Take one tablet 5  times a week  . temazepam (RESTORIL) 15 MG capsule Take one capsule by mouth nightly as needed for sleep   No facility-administered encounter medications on file as of 06/14/2017.     Review of Systems  Constitutional: Negative for activity change, appetite change, chills, fatigue and fever.  Eyes: Positive for visual disturbance. Negative for discharge, redness and itching.  Respiratory: Negative for cough, chest tightness, shortness of breath and wheezing.   Cardiovascular: Negative for chest pain, palpitations and leg swelling.  Gastrointestinal: Negative for abdominal distention, abdominal pain, constipation, diarrhea, nausea and vomiting.  Skin: Negative for color change, pallor and rash.  Neurological: Negative for dizziness, light-headedness and headaches.  Psychiatric/Behavioral: Negative for agitation, confusion and sleep disturbance. The patient is not nervous/anxious.     Immunization History  Administered Date(s) Administered  . DTaP 07/20/2013  . Influenza, High Dose Seasonal PF 12/07/2016  . Influenza-Unspecified 11/20/2014, 12/12/2015  . PPD Test 08/06/2014  . Pneumococcal Conjugate-13 11/07/2013  . Pneumococcal Polysaccharide-23  03/22/2017  . Zoster 03/01/2006  . Zoster Recombinat (Shingrix) 06/07/2017   Pertinent  Health Maintenance Due  Topic Date Due  . DEXA SCAN  08/20/1995  . INFLUENZA VACCINE  09/29/2017  . PNA vac Low Risk Adult  Completed   Fall Risk  06/03/2017 01/19/2017 11/23/2016 09/09/2015  Falls in the past year? Yes No No No  Number falls in past yr: 1 -  - -  Injury with Fall? No - - -    Vitals:   06/14/17 1331  BP: 140/84  Pulse: 70  Resp: 16  Temp: 98.2 F (36.8 C)  TempSrc: Oral  SpO2: 91%  Weight: 157 lb 12.8 oz (71.6 kg)  Height: 5' (1.524 m)   Body mass index is 30.82 kg/m. Physical Exam  Constitutional: She is oriented to person, place, and time. She appears well-developed and well-nourished. No distress.  HENT:  Head: Normocephalic.  Mouth/Throat: No oropharyngeal exudate.  Eyes: Pupils are equal, round, and reactive to light. Conjunctivae and EOM are normal. Right eye exhibits no discharge. Left eye exhibits no discharge. No scleral icterus.  Neck: Normal range of motion. No JVD present. No thyromegaly present.  Cardiovascular: Normal rate, regular rhythm, normal heart sounds and intact distal pulses. Exam reveals no gallop and no friction rub.  No murmur heard. Pulmonary/Chest: Effort normal and breath sounds normal. No respiratory distress. She has no wheezes. She has no rales.  Abdominal: Soft. Bowel sounds are normal. She exhibits no distension. There is no tenderness. There is no rebound and no guarding.  Musculoskeletal: She exhibits no edema or tenderness.  Moves x 4 extremities   Lymphadenopathy:    She has no cervical adenopathy.  Neurological: She is oriented to person, place, and time.  Skin: Skin is warm and dry. No rash noted. No erythema. No pallor.  Psychiatric: She has a normal mood and affect. Her behavior is normal.   Labs reviewed: Recent Labs    12/31/16 0000 04/21/17 0750 06/01/17 0750  NA 138 140 140  K 4.4 4.5 4.4  CL 103 105 104  CO2 27 28 28   GLUCOSE 132* 125* 128*  BUN 16 16 17   CREATININE 0.66 0.65 0.84  CALCIUM 8.8 8.9 9.3   Recent Labs    12/31/16 0000 04/21/17 0750 06/01/17 0750  AST 17 16 16   ALT 17 14 13   BILITOT 0.3 0.3 0.3  PROT 6.6 6.6 6.8   Recent Labs    06/01/17 0750  WBC 10.5  NEUTROABS 6,678  HGB 13.0  HCT 40.4  MCV 79.2*  PLT 328   Lab Results    Component Value Date   TSH 1.97 04/21/2017   Lab Results  Component Value Date   HGBA1C 6.3 (H) 06/01/2017   Lab Results  Component Value Date   CHOL 159 06/01/2017   HDL 35 (L) 06/01/2017   LDLCALC 92 06/01/2017   TRIG 220 (H) 06/01/2017   CHOLHDL 4.5 06/01/2017    Significant Diagnostic Results in last 30 days:  No results found.  Assessment/Plan   Elevated blood pressure reading Blood pressure log reviewed 149/72,140/84 and 161/86.B/P within normal range during visit.No dizziness,headache,lightheaded,chest pain,nausea or vomiting.No indication for antihypertensive for now.encouraged to continue on low carbo,low saturated fats,high fiber diet and exercise.also encouraged to drink plenty of fluid.Check your  blood pressure twice per week at the Assisted Living.Notify provide's office if Blood pressure is greater than 150/90 three consecutive times.   Family/ staff Communication: Reviewed plan of care with patient. Labs/tests  ordered: None   Sandrea Hughs, NP

## 2017-06-15 DIAGNOSIS — Z822 Family history of deafness and hearing loss: Secondary | ICD-10-CM | POA: Diagnosis not present

## 2017-06-15 DIAGNOSIS — H903 Sensorineural hearing loss, bilateral: Secondary | ICD-10-CM | POA: Diagnosis not present

## 2017-06-15 DIAGNOSIS — Z77122 Contact with and (suspected) exposure to noise: Secondary | ICD-10-CM | POA: Diagnosis not present

## 2017-07-20 ENCOUNTER — Encounter: Payer: Self-pay | Admitting: Internal Medicine

## 2017-08-05 ENCOUNTER — Other Ambulatory Visit: Payer: Self-pay

## 2017-08-05 DIAGNOSIS — E785 Hyperlipidemia, unspecified: Secondary | ICD-10-CM

## 2017-08-05 DIAGNOSIS — R7303 Prediabetes: Secondary | ICD-10-CM

## 2017-08-05 DIAGNOSIS — E038 Other specified hypothyroidism: Secondary | ICD-10-CM

## 2017-08-08 DIAGNOSIS — R7303 Prediabetes: Secondary | ICD-10-CM | POA: Diagnosis not present

## 2017-08-08 DIAGNOSIS — E785 Hyperlipidemia, unspecified: Secondary | ICD-10-CM

## 2017-08-08 DIAGNOSIS — E038 Other specified hypothyroidism: Secondary | ICD-10-CM

## 2017-08-09 LAB — COMPREHENSIVE METABOLIC PANEL
AG RATIO: 1.4 (calc) (ref 1.0–2.5)
ALT: 13 U/L (ref 6–29)
AST: 15 U/L (ref 10–35)
Albumin: 3.9 g/dL (ref 3.6–5.1)
Alkaline phosphatase (APISO): 118 U/L (ref 33–130)
BUN: 14 mg/dL (ref 7–25)
CHLORIDE: 103 mmol/L (ref 98–110)
CO2: 27 mmol/L (ref 20–32)
CREATININE: 0.66 mg/dL (ref 0.60–0.88)
Calcium: 9 mg/dL (ref 8.6–10.4)
GLOBULIN: 2.8 g/dL (ref 1.9–3.7)
Glucose, Bld: 132 mg/dL — ABNORMAL HIGH (ref 65–99)
Potassium: 4.4 mmol/L (ref 3.5–5.3)
Sodium: 139 mmol/L (ref 135–146)
Total Bilirubin: 0.3 mg/dL (ref 0.2–1.2)
Total Protein: 6.7 g/dL (ref 6.1–8.1)

## 2017-08-09 LAB — LIPID PANEL
Cholesterol: 172 mg/dL (ref <200)
HDL: 37 mg/dL — AB (ref >50)
LDL Cholesterol (Calc): 109 mg/dL (calc) — ABNORMAL HIGH
Non-HDL Cholesterol (Calc): 135 mg/dL (calc) — ABNORMAL HIGH (ref <130)
Total CHOL/HDL Ratio: 4.6 (calc) (ref <5.0)
Triglycerides: 144 mg/dL (ref <150)

## 2017-08-09 LAB — HEMOGLOBIN A1C
Hgb A1c MFr Bld: 6.8 % of total Hgb — ABNORMAL HIGH (ref <5.7)
Mean Plasma Glucose: 148 (calc)
eAG (mmol/L): 8.2 (calc)

## 2017-08-09 LAB — TSH: TSH: 2.12 mIU/L (ref 0.40–4.50)

## 2017-08-17 ENCOUNTER — Encounter: Payer: Medicare Other | Admitting: Internal Medicine

## 2017-08-24 ENCOUNTER — Non-Acute Institutional Stay: Payer: Medicare Other | Admitting: Internal Medicine

## 2017-08-24 ENCOUNTER — Encounter: Payer: Self-pay | Admitting: Internal Medicine

## 2017-08-24 VITALS — BP 134/70 | HR 90 | Temp 97.8°F | Resp 18 | Ht 60.0 in | Wt 158.2 lb

## 2017-08-24 DIAGNOSIS — E2839 Other primary ovarian failure: Secondary | ICD-10-CM

## 2017-08-24 DIAGNOSIS — E038 Other specified hypothyroidism: Secondary | ICD-10-CM | POA: Diagnosis not present

## 2017-08-24 DIAGNOSIS — E1169 Type 2 diabetes mellitus with other specified complication: Secondary | ICD-10-CM | POA: Diagnosis not present

## 2017-08-24 DIAGNOSIS — F5101 Primary insomnia: Secondary | ICD-10-CM | POA: Diagnosis not present

## 2017-08-24 DIAGNOSIS — E669 Obesity, unspecified: Secondary | ICD-10-CM

## 2017-08-24 DIAGNOSIS — E559 Vitamin D deficiency, unspecified: Secondary | ICD-10-CM

## 2017-08-24 DIAGNOSIS — Z23 Encounter for immunization: Secondary | ICD-10-CM

## 2017-08-24 DIAGNOSIS — Z0000 Encounter for general adult medical examination without abnormal findings: Secondary | ICD-10-CM | POA: Diagnosis not present

## 2017-08-24 DIAGNOSIS — E782 Mixed hyperlipidemia: Secondary | ICD-10-CM

## 2017-08-24 DIAGNOSIS — H903 Sensorineural hearing loss, bilateral: Secondary | ICD-10-CM

## 2017-08-24 MED ORDER — TETANUS-DIPHTH-ACELL PERTUSSIS 5-2-15.5 LF-MCG/0.5 IM SUSP: 0.5000 mL | Freq: Once | INTRAMUSCULAR | 0 refills | Status: AC

## 2017-08-24 MED ORDER — METFORMIN HCL 500 MG PO TABS
500.0000 mg | ORAL_TABLET | Freq: Every day | ORAL | 3 refills | Status: DC
Start: 2017-08-24 — End: 2017-10-10

## 2017-08-24 MED ORDER — CALCIUM 600 MG PO TABS: 600.0000 mg | ORAL_TABLET | Freq: Two times a day (BID) | ORAL | 3 refills | Status: DC

## 2017-08-24 MED ORDER — ATORVASTATIN CALCIUM 10 MG PO TABS: 10.0000 mg | ORAL_TABLET | Freq: Every day | ORAL | 3 refills | Status: DC

## 2017-08-24 NOTE — Patient Instructions (Signed)
  You are due for tetanus booster shot, script has been sent to pharmacy. Please get it.   Start taking metformin ( diabetes medication), 500 mg once a day in morning with breakfast with you diabetes.   Start taking atorvastatin (cholesterol lowering medication), 1 tablet in the evening to help lower your cholesterol.   I need you to take calcium supplement 600 mg 1 tablet twice a day, this is available over the counter.  I need you to get bone density test (DEXA scan) done to see if you have strong or weak bones.   You need to see your eye doctor at least once a year for diabetes eye exam from now on.

## 2017-08-24 NOTE — Progress Notes (Signed)
[facility]  Provider: Blanchie Serve MD   Location:  [place] of Service:  Clinic (12)  PCP: Blanchie Serve, MD Patient Care Team: Blanchie Serve, MD as PCP - General (Internal Medicine)  Extended Emergency Contact Information Primary Emergency Contact: Harris,Margaret Address: [address]          [place], [place] [address] [name] of [place] Phone: [phone] Mobile Phone: [phone] Relation: Daughter  Goals of Care: Advanced Directive information Advanced Directives 06/14/2017  Does Patient Have a Medical Advance Directive? Yes  Type of Advance Directive [place]  Does patient want to make changes to medical advance directive? -  Copy of [place] in Chart? No - copy requested      Chief Complaint  Patient presents with  . Medication Refill    No refills needed at this time.   Marland Kitchen Results    Discuss labs  . Annual Exam    Annual exam    HPI: [name] a 84 y.o. female seen today for annual visit.   Hypothyroidism- takes levothyroxine 75 mcg daily for now and tolerating well. Denies constipation or cold intolerance.   Vitamin d deficiency- taking vitamin d supplement  Hyperlipidemia- takes omega 3 5 days a week.   Insomnia- takes temazepam 15 mg daily as needed to help her sleep  She walks about 15-20 minutes 2-3 days a week for exercise and tries to avoid sugar and fried food if possible, takes them ocassionally. Not taking her calcium supplement. Did not get dexa scan that was scheduled last time.   Past Medical History:  Diagnosis Date  . Hearing loss   . History of fracture of clavicle   . Hypothyroidism   . Insomnia   . Tinnitus    Past Surgical History:  Procedure Laterality Date  . CHOLECYSTECTOMY  2007   Dr. Verdene Lennert  . CLAVICLE EXCISION  1986  . MOHS SURGERY     past 10 years chest & legs    reports that she has never smoked. She has never used smokeless  tobacco. She reports that she drinks alcohol. She reports that she does not use drugs. Social History   Socioeconomic History  . Marital status: Widowed    Spouse name: Not on file  . Number of children: Not on file  . Years of education: Not on file  . Highest education level: Not on file  Occupational History  . Occupation: retired Web designer  Social Needs  . Financial resource strain: Not hard at all  . Food insecurity:    Worry: Never true    Inability: Never true  . Transportation needs:    Medical: No    Non-medical: No  Tobacco Use  . Smoking status: Never Smoker  . Smokeless tobacco: Never Used  Substance and Sexual Activity  . Alcohol use: Yes    Comment: 4 per week  . Drug use: No  . Sexual activity: Never  Lifestyle  . Physical activity:    Days per week: 7 days    Minutes per session: 30 min  . Stress: Only a little  Relationships  . Social connections:    Talks on phone: More than three times a week    Gets together: More than three times a week    Attends religious service: More than 4 times per year    Active member of club or organization: No    Attends  meetings of clubs or organizations: Never    Relationship status: Widowed  . Intimate partner violence:    Fear of current or ex partner: No    Emotionally abused: No    Physically abused: No    Forced sexual activity: No  Other Topics Concern  . Not on file  Social History Narrative   Lives at [facility] since 12/23/14   Widowed   Never smoked   Alcohol 5 per week   Exercise 3 times a week classes, walking.    Activities Bridge, book groups   POA, Living Will   Still driving       Functional Status Survey:    Family History  Problem Relation Age of Onset  . Heart disease Mother   . Heart disease Father     Health Maintenance  Topic Date Due  . DEXA SCAN  08/20/1995  . TETANUS/TDAP  11/24/2026 (Originally 08/19/1947)  . INFLUENZA VACCINE  09/29/2017  . PNA vac Low Risk  Adult  Completed    No Known Allergies  Outpatient Encounter Medications as of 08/24/2017  Medication Sig  . Cholecalciferol (VITAMIN D3) 2000 units TABS Take by mouth. Take one Vitamin D daily  . levothyroxine (SYNTHROID, LEVOTHROID) 75 MCG tablet Take 1 tablet (75 mcg total) by mouth daily before breakfast.  . MULTIPLE VITAMIN PO Take by mouth. Take one vitamin daily  . Omega-3 1400 MG CAPS Take by mouth. Take one tablet 5  times a week  . temazepam (RESTORIL) 15 MG capsule Take one capsule by mouth nightly as needed for sleep  . acetaminophen (TYLENOL) 500 MG tablet Take 1 tablet (500 mg total) by mouth every 6 (six) hours as needed for moderate pain. (Patient not taking: Reported on 08/24/2017)  . calcium carbonate (OS-CAL) 600 MG tablet Take 1 tablet (600 mg total) by mouth 2 (two) times daily with a meal. (Patient not taking: Reported on 08/24/2017)   No facility-administered encounter medications on file as of 08/24/2017.     Review of Systems  Constitutional: Negative for appetite change, chills, fatigue and fever.  HENT: Positive for hearing loss. Negative for congestion, ear discharge, ear pain, mouth sores, rhinorrhea, sinus pressure, sinus pain, sore throat and trouble swallowing.        Has hearing aid  Eyes: Negative for pain, discharge, itching and visual disturbance.       Has implants  Respiratory: Negative for cough, choking, shortness of breath and wheezing.   Cardiovascular: Negative for chest pain, palpitations and leg swelling.  Gastrointestinal: Negative for abdominal pain, blood in stool, constipation, diarrhea, nausea, rectal pain and vomiting.       Has had few episodes of fecal incontinence if does not go to the toilet right away- 2 episode in last 6 month  Genitourinary: Positive for frequency. Negative for dysuria, hematuria, pelvic pain, urgency and vaginal discharge.       Wakes up 2-3 times at night to urinate  Musculoskeletal: Negative for arthralgias, back  pain, gait problem and joint swelling.       No fall reported, no assistive device used  Skin: Negative for rash.  Neurological: Negative for dizziness, tremors, seizures, weakness, numbness and headaches.  Hematological: Does not bruise/bleed easily.  Psychiatric/Behavioral: Negative for behavioral problems, confusion, dysphoric mood, hallucinations, sleep disturbance and suicidal ideas. The patient is not nervous/anxious.        Rarely requires restoril as sleep aid    Vitals:   08/24/17 1111  BP: 134/70  Pulse: 90  Resp: 18  Temp: 97.8 F (36.6 C)  TempSrc: Oral  SpO2: 97%  Weight: 158 lb 3.2 oz (71.8 kg)  Height: 5' (1.524 m)   Body mass index is 30.9 kg/m.   Wt Readings from Last 3 Encounters:  08/24/17 158 lb 3.2 oz (71.8 kg)  06/14/17 157 lb 12.8 oz (71.6 kg)  06/03/17 157 lb (71.2 kg)   Physical Exam  Constitutional: She is oriented to person, place, and time. She appears well-developed. No distress.  Obese elderly female in no distress.   HENT:  Head: Normocephalic and atraumatic.  Right Ear: External ear normal.  Left Ear: External ear normal.  Nose: Nose normal.  Mouth/Throat: Oropharynx is clear and moist. No oropharyngeal exudate.  Has hearing aids to both ears  Eyes: Pupils are equal, round, and reactive to light. Conjunctivae and EOM are normal. Right eye exhibits no discharge. Left eye exhibits no discharge. No scleral icterus.  Neck: Normal range of motion. Neck supple.  Cardiovascular: Normal rate, regular rhythm and intact distal pulses.  No murmur heard. Pulmonary/Chest: Effort normal and breath sounds normal. She has no wheezes. She has no rales.  Abdominal: Soft. Bowel sounds are normal. There is no tenderness. There is no guarding.  Musculoskeletal: Normal range of motion. She exhibits no edema.  Able to move all 4 extremities  Lymphadenopathy:    She has no cervical adenopathy.  Neurological: She is alert and oriented to person, place, and  time. She exhibits normal muscle tone. Coordination normal.  Skin: Skin is warm and dry. She is not diaphoretic.  Psychiatric: She has a normal mood and affect. Her behavior is normal.    Labs reviewed: Basic Metabolic Panel: Recent Labs    04/21/17 0750 06/01/17 0750 08/08/17 0810  NA 140 140 139  K 4.5 4.4 4.4  CL 105 104 103  CO2 '28 28 27  ' GLUCOSE 125* 128* 132*  BUN '16 17 14  ' CREATININE 0.65 0.84 0.66  CALCIUM 8.9 9.3 9.0   Liver Function Tests: Recent Labs    04/21/17 0750 06/01/17 0750 08/08/17 0810  AST '16 16 15  ' ALT '14 13 13  ' BILITOT 0.3 0.3 0.3  PROT 6.6 6.8 6.7   No results for input(s): LIPASE, AMYLASE in the last 8760 hours. No results for input(s): AMMONIA in the last 8760 hours. CBC: Recent Labs    06/01/17 0750  WBC 10.5  NEUTROABS 6,678  HGB 13.0  HCT 40.4  MCV 79.2*  PLT 328   Cardiac Enzymes: No results for input(s): CKTOTAL, CKMB, CKMBINDEX, TROPONINI in the last 8760 hours. BNP: Invalid input(s): POCBNP Lab Results  Component Value Date   HGBA1C 6.8 (H) 08/08/2017   Lab Results  Component Value Date   TSH 2.12 08/08/2017   No results found for: VITAMINB12 No results found for: FOLATE No results found for: IRON, TIBC, FERRITIN  Lipid Panel: Recent Labs    04/21/17 0750 06/01/17 0750 08/08/17 0810  CHOL 169 159 172  HDL 35* 35* 37*  LDLCALC 105* 92 109*  TRIG 176* 220* 144  CHOLHDL 4.8 4.5 4.6   Lab Results  Component Value Date   HGBA1C 6.8 (H) 08/08/2017    Procedures since last visit: No results found.   MMSE - Mini Mental State Exam 01/19/2017  Not completed: (No Data)  Orientation to time 5  Orientation to Place 5  Registration 3  Attention/ Calculation 5  Recall 3  Language- name 2 objects 2  Language-  repeat 1  Language- follow 3 step command 3  Language- read & follow direction 1  Write a sentence 1  Copy design 1  Total score 30   08/24/17 EKG- normal sinus rhythm, HR 88/min  Immunization  History  Administered Date(s) Administered  . DTaP 07/20/2013  . Influenza, High Dose Seasonal PF 12/07/2016  . Influenza-Unspecified 11/20/2014, 12/12/2015  . PPD Test 08/06/2014  . Pneumococcal Conjugate-13 11/07/2013  . Pneumococcal Polysaccharide-23 03/22/2017  . Zoster 03/01/2006  . Zoster Recombinat (Shingrix) 06/07/2017    Assessment/Plan  1. Vitamin D deficiency Continue vitamin d supplement. Advised to take calcium 600 mg bid as well.  - calcium carbonate (OS-CAL) 600 MG tablet; Take 1 tablet (600 mg total) by mouth 2 (two) times daily with a meal.  Dispense: 30 tablet; Refill: 3  2. Diabetes mellitus type 2 in obese (HCC) Reviewed a1c. With her BMI, high cholesterol and age, will start her on oral hypoglycemic. Start metformin 500 mg daily and monitor. Start statin as below. Check urine for microalbumin. Pt advised to have yearly eye exam.  - metFORMIN (GLUCOPHAGE) 500 MG tablet; Take 1 tablet (500 mg total) by mouth daily with breakfast.  Dispense: 90 tablet; Refill: 3 - atorvastatin (LIPITOR) 10 MG tablet; Take 1 tablet (10 mg total) by mouth daily.  Dispense: 90 tablet; Refill: 3 - CMP with eGFR([facility]); Future - Lipid Panel; Future - Hemoglobin A1c; Future - Microalbumin/Creatinine Ratio, Urine; Future  3. Mixed hyperlipidemia Start atorvastatin 10 mg daily. Check lipid and LFT in 3 months, common side effects explained.  - atorvastatin (LIPITOR) 10 MG tablet; Take 1 tablet (10 mg total) by mouth daily.  Dispense: 90 tablet; Refill: 3 - CMP with eGFR([facility]); Future - Lipid Panel; Future  4. Obesity (BMI 30.0-34.9) Encouraged to do brisk walk for 30 minutes 5 days a week. Advised to cut down on sweets and fried food and add more vegetables and fresh fruit to her meals.  - Lipid Panel; Future - TSH; Future - Hemoglobin A1c; Future  5. Visit for annual health examination the patient was counseled regarding routine eye exam, prevention of dental and periodontal  disease, diet, regular sustained exercise for at least 30 minutes 5 times per week, the proper use of sunscreen and protective clothing, routine cholesterol, thyroid and diabetes screening. Results of blood work reviewed with pt, copy provided.   6. Other specified hypothyroidism Reviewed TSH. Continue levothyroxine - TSH; Future  7. Sensorineural hearing loss (SNHL) of both ears Continue hearing aids  8. Primary insomnia Continue restoril as needed.   9. Estrogen deficiency - DG Bone Density; Future Advised to take caclium and vitamin d supplement and do weight bearing exercise  10. Need for Tdap vaccination Script to pharmacy - Tdap (ADACEL) 06-30-13.5 LF-MCG/0.5 injection; Inject 0.5 mLs into the muscle once for 1 dose.  Dispense: 0.5 mL; Refill: 0   Labs/tests ordered:   Lab Orders     CMP with eGFR([facility])     Lipid Panel     TSH     Hemoglobin A1c     Microalbumin/Creatinine Ratio, Urine   Next appointment: 3 months  Communication: reviewed care plan with patient     Blanchie Serve, MD Internal Medicine [facility] [place], [place] [address] Cell Phone (Monday-Friday 8 am - 5 pm): [phone] On Call: [phone] and follow prompts after 5 pm and on weekends Office Phone: [phone] Office Fax: [phone]

## 2017-08-24 NOTE — Addendum Note (Signed)
Addended byBlanchie Serve on: 08/24/2017 03:54 PM   Modules accepted: Level of Service

## 2017-09-07 ENCOUNTER — Encounter: Payer: Self-pay | Admitting: Internal Medicine

## 2017-09-07 ENCOUNTER — Non-Acute Institutional Stay: Payer: Medicare Other | Admitting: Internal Medicine

## 2017-09-07 VITALS — BP 124/64 | HR 87 | Temp 98.2°F | Resp 18 | Ht 60.0 in | Wt 159.2 lb

## 2017-09-07 DIAGNOSIS — R195 Other fecal abnormalities: Secondary | ICD-10-CM

## 2017-09-07 DIAGNOSIS — R600 Localized edema: Secondary | ICD-10-CM

## 2017-09-07 NOTE — Progress Notes (Signed)
[facility]  Provider: Blanchie Serve MD   Location:  [place] of Service:  Clinic (12)  PCP: Blanchie Serve, MD Patient Care Team: Blanchie Serve, MD as PCP - General (Internal Medicine)  Extended Emergency Contact Information Primary Emergency Contact: Harris,Margaret Address: [address]          [place], [place] [address] [name] of [place] Phone: [phone] Mobile Phone: [phone] Relation: Daughter   Goals of Care: Advanced Directive information Advanced Directives 06/14/2017  Does Patient Have a Medical Advance Directive? Yes  Type of Advance Directive [place]  Does patient want to make changes to medical advance directive? -  Copy of [place] in Chart? No - copy requested      Chief Complaint  Patient presents with  . Acute Visit    loose stool, leg swelling    HPI: [name] a 84 y.o. female seen today for acute visit for loose stool with urgency and incontinence on Sunday. She was seen in clinic on 08/24/17 and started on metformin 500 mg daily and atorvastatin 10 mg daily. On Sunday, she had a meal with her family that had steak and fries. 1 hour after this, she had urgency to defecate and then had bowel movement in her clothes. No diarrhea or loose stool since then. Had 2 bowel movement yesterday with soft stool. Has not had a bowel movement today. Denies nausea or vomiting. Appetite is good. Seen today with her daughter present.     Past Medical History:  Diagnosis Date  . Hearing loss   . History of fracture of clavicle   . Hypothyroidism   . Insomnia   . Tinnitus    Past Surgical History:  Procedure Laterality Date  . CHOLECYSTECTOMY  2007   Dr. Verdene Lennert  . CLAVICLE EXCISION  1986  . MOHS SURGERY     past 10 years chest & legs    reports that she has never smoked. She has never used smokeless tobacco. She reports that she drinks alcohol. She reports that she  does not use drugs. Social History   Socioeconomic History  . Marital status: Widowed    Spouse name: Not on file  . Number of children: Not on file  . Years of education: Not on file  . Highest education level: Not on file  Occupational History  . Occupation: retired Web designer  Social Needs  . Financial resource strain: Not hard at all  . Food insecurity:    Worry: Never true    Inability: Never true  . Transportation needs:    Medical: No    Non-medical: No  Tobacco Use  . Smoking status: Never Smoker  . Smokeless tobacco: Never Used  Substance and Sexual Activity  . Alcohol use: Yes    Comment: 4 per week  . Drug use: No  . Sexual activity: Never  Lifestyle  . Physical activity:    Days per week: 7 days    Minutes per session: 30 min  . Stress: Only a little  Relationships  . Social connections:    Talks on phone: More than three times a week    Gets together: More than three times a week    Attends religious service: More than 4 times per year    Active member of club or organization: No    Attends meetings of clubs or organizations: Never    Relationship status: Widowed  .  Intimate partner violence:    Fear of current or ex partner: No    Emotionally abused: No    Physically abused: No    Forced sexual activity: No  Other Topics Concern  . Not on file  Social History Narrative   Lives at [facility] since 12/23/14   Widowed   Never smoked   Alcohol 5 per week   Exercise 3 times a week classes, walking.    Activities Bridge, book groups   POA, Living Will   Still driving        Family History  Problem Relation Age of Onset  . Heart disease Mother   . Heart disease Father     Health Maintenance  Topic Date Due  . FOOT EXAM  08/19/1938  . OPHTHALMOLOGY EXAM  [birth year]19/1938  . DEXA SCAN  08/20/1995  . TETANUS/TDAP  11/24/2026 (Originally 08/19/1947)  . INFLUENZA VACCINE  09/29/2017  . URINE MICROALBUMIN  12/31/2017  . HEMOGLOBIN A1C   02/07/2018  . PNA vac Low Risk Adult  Completed    No Known Allergies  Outpatient Encounter Medications as of 09/07/2017  Medication Sig  . atorvastatin (LIPITOR) 10 MG tablet Take 1 tablet (10 mg total) by mouth daily.  . calcium carbonate (OS-CAL) 600 MG tablet Take 1 tablet (600 mg total) by mouth 2 (two) times daily with a meal.  . Cholecalciferol (VITAMIN D3) 2000 units TABS Take by mouth. Take one Vitamin D daily  . levothyroxine (SYNTHROID, LEVOTHROID) 75 MCG tablet Take 1 tablet (75 mcg total) by mouth daily before breakfast.  . metFORMIN (GLUCOPHAGE) 500 MG tablet Take 1 tablet (500 mg total) by mouth daily with breakfast.  . MULTIPLE VITAMIN PO Take by mouth. Take one vitamin daily  . Omega-3 1400 MG CAPS Take by mouth. Take one tablet 5  times a week  . temazepam (RESTORIL) 15 MG capsule Take one capsule by mouth nightly as needed for sleep   No facility-administered encounter medications on file as of 09/07/2017.     Review of Systems  Constitutional: Negative for appetite change, chills, diaphoresis, fatigue and fever.  HENT: Negative for congestion.   Respiratory: Negative for shortness of breath.   Cardiovascular: Positive for leg swelling. Negative for chest pain.  Gastrointestinal: Negative for abdominal distention, abdominal pain, blood in stool, constipation, diarrhea, nausea and vomiting.  Genitourinary: Negative for dysuria.  Skin: Negative for rash.  Neurological: Negative for dizziness and headaches.    Vitals:   09/07/17 1114  BP: 124/64  Pulse: 87  Resp: 18  Temp: 98.2 F (36.8 C)  TempSrc: Oral  SpO2: 97%  Weight: 159 lb 3.2 oz (72.2 kg)  Height: 5' (1.524 m)   Body mass index is 31.09 kg/m. Physical Exam  Constitutional: She is oriented to person, place, and time. No distress.  Obese elderly female  HENT:  Head: Normocephalic and atraumatic.  Mouth/Throat: Oropharynx is clear and moist.  Neck: Neck supple.  Abdominal: Soft. Bowel sounds are  normal. She exhibits no distension and no mass. There is no tenderness. There is no rebound and no guarding.  Musculoskeletal: Normal range of motion. She exhibits edema.  Trace leg edema, good dorsalis pedis  Neurological: She is alert and oriented to person, place, and time.  Skin: Skin is warm. Capillary refill takes less than 2 seconds. She is not diaphoretic.  Psychiatric: She has a normal mood and affect.    Labs reviewed: Basic Metabolic Panel: Recent Labs    04/21/17  0750 06/01/17 0750 08/08/17 0810  NA 140 140 139  K 4.5 4.4 4.4  CL 105 104 103  CO2 28 28 27   GLUCOSE 125* 128* 132*  BUN 16 17 14   CREATININE 0.65 0.84 0.66  CALCIUM 8.9 9.3 9.0   Liver Function Tests: Recent Labs    04/21/17 0750 06/01/17 0750 08/08/17 0810  AST 16 16 15   ALT 14 13 13   BILITOT 0.3 0.3 0.3  PROT 6.6 6.8 6.7   No results for input(s): LIPASE, AMYLASE in the last 8760 hours. No results for input(s): AMMONIA in the last 8760 hours. CBC: Recent Labs    06/01/17 0750  WBC 10.5  NEUTROABS 6,678  HGB 13.0  HCT 40.4  MCV 79.2*  PLT 328   Cardiac Enzymes: No results for input(s): CKTOTAL, CKMB, CKMBINDEX, TROPONINI in the last 8760 hours. BNP: Invalid input(s): POCBNP Lab Results  Component Value Date   HGBA1C 6.8 (H) 08/08/2017   Lab Results  Component Value Date   TSH 2.12 08/08/2017   No results found for: VITAMINB12 No results found for: FOLATE No results found for: IRON, TIBC, FERRITIN  Lipid Panel: Recent Labs    04/21/17 0750 06/01/17 0750 08/08/17 0810  CHOL 169 159 172  HDL 35* 35* 37*  LDLCALC 105* 92 109*  TRIG 176* 220* 144  CHOLHDL 4.8 4.5 4.6   Lab Results  Component Value Date   HGBA1C 6.8 (H) 08/08/2017    Procedures since last visit: No results found.  Assessment/Plan  1. Loose stools None for last 3 days. Could be from fatty meal with her being s/p cholecystectomy and being on metformin and statin that were recently introduced. No  further loose stool. Advised to keep herself hydrated. Have small portion of fatty meal if desired. If symptom recurs, will consider decreasing dosing of metformin.   2. Bilateral leg edema Good distal pulses. Likely has some PVD. Advised to have compression stockings during daytime. Keep legs elevated at rest.    Labs/tests ordered:  none  Next appointment: has follow up  Communication: reviewed care plan with patient and her daughter    Blanchie Serve, MD Internal Medicine [place], [place] [address] Cell Phone (Monday-Friday 8 am - 5 pm): [phone] On Call: [phone] and follow prompts after 5 pm and on weekends Office Phone: [phone] Office Fax: [phone]

## 2017-09-07 NOTE — Patient Instructions (Addendum)
  Continue to take your metformin and atorvastatin.  Keep yourself hydrated.   If you develop loose stool, nausea or vomiting, abdominal pain, please notify our office.   Wear compression stockings to help with swelling to your legs. Keep legs elevated when resting. Remove stockings at night time before going to bed.

## 2017-09-21 ENCOUNTER — Other Ambulatory Visit: Payer: Self-pay | Admitting: *Deleted

## 2017-09-21 DIAGNOSIS — F5101 Primary insomnia: Secondary | ICD-10-CM

## 2017-09-21 MED ORDER — TEMAZEPAM 15 MG PO CAPS: ORAL_CAPSULE | ORAL | 0 refills | Status: DC

## 2017-09-21 NOTE — Telephone Encounter (Signed)
Patient called and requested refill.  [facility] Verified LR: 03/21/2017 for #15 Pended Rx and sent to Dr. Bubba Camp for approval.

## 2017-09-21 NOTE — Telephone Encounter (Signed)
Error

## 2017-09-27 ENCOUNTER — Encounter: Payer: Medicare Other | Admitting: Family

## 2017-09-27 ENCOUNTER — Encounter: Payer: Self-pay | Admitting: Internal Medicine

## 2017-10-10 ENCOUNTER — Non-Acute Institutional Stay: Payer: Medicare Other | Admitting: Internal Medicine

## 2017-10-10 ENCOUNTER — Encounter: Payer: Self-pay | Admitting: Internal Medicine

## 2017-10-10 VITALS — BP 124/64 | HR 80 | Temp 98.2°F | Resp 18 | Ht 60.0 in | Wt 157.4 lb

## 2017-10-10 DIAGNOSIS — T50905A Adverse effect of unspecified drugs, medicaments and biological substances, initial encounter: Secondary | ICD-10-CM

## 2017-10-10 DIAGNOSIS — E1169 Type 2 diabetes mellitus with other specified complication: Secondary | ICD-10-CM | POA: Diagnosis not present

## 2017-10-10 DIAGNOSIS — E669 Obesity, unspecified: Secondary | ICD-10-CM | POA: Diagnosis not present

## 2017-10-10 DIAGNOSIS — R195 Other fecal abnormalities: Secondary | ICD-10-CM | POA: Diagnosis not present

## 2017-10-10 NOTE — Patient Instructions (Signed)
  Stop taking metformin for now. This likely contributed to your episodes of loose stool and incontinence. I will keep a check on your blood test for glucose.

## 2017-10-10 NOTE — Progress Notes (Signed)
[facility]  Provider: Blanchie Serve MD   Location:  [place] of Service:  Clinic (12)  PCP: Blanchie Serve, MD Patient Care Team: Blanchie Serve, MD as PCP - General (Internal Medicine)  Extended Emergency Contact Information Primary Emergency Contact: Harris,Margaret Address: [address]          [place], [place] [address] [name] of [place] Phone: [phone] Mobile Phone: [phone] Relation: Daughter   Goals of Care: Advanced Directive information Advanced Directives 06/14/2017  Does Patient Have a Medical Advance Directive? Yes  Type of Advance Directive [place]  Does patient want to make changes to medical advance directive? -  Copy of [place] in Chart? No - copy requested    Chief Complaint  Patient presents with  . Acute Visit    loose stools    HPI: [name] a 84 y.o. female seen today for acute visit. She has been having intermittent episodes of loose stool for few weeks. There has been episodes of fecal incontinence. She had reported this over the phone to nursing and was asked to stop her metformin. She is seen for follow up today. She has stopped taking metformin since Friday morning. She denies any further loose stool or incontinence.   Past Medical History:  Diagnosis Date  . Hearing loss   . History of fracture of clavicle   . Hypothyroidism   . Insomnia   . Tinnitus    Past Surgical History:  Procedure Laterality Date  . CHOLECYSTECTOMY  2007   Dr. Verdene Lennert  . CLAVICLE EXCISION  1986  . MOHS SURGERY     past 10 years chest & legs    reports that she has never smoked. She has never used smokeless tobacco. She reports that she drinks alcohol. She reports that she does not use drugs. Social History   Socioeconomic History  . Marital status: Widowed    Spouse name: Not on file  . Number of children: Not on file  . Years of education: Not on file  .  Highest education level: Not on file  Occupational History  . Occupation: retired Web designer  Social Needs  . Financial resource strain: Not hard at all  . Food insecurity:    Worry: Never true    Inability: Never true  . Transportation needs:    Medical: No    Non-medical: No  Tobacco Use  . Smoking status: Never Smoker  . Smokeless tobacco: Never Used  Substance and Sexual Activity  . Alcohol use: Yes    Comment: 4 per week  . Drug use: No  . Sexual activity: Never  Lifestyle  . Physical activity:    Days per week: 7 days    Minutes per session: 30 min  . Stress: Only a little  Relationships  . Social connections:    Talks on phone: More than three times a week    Gets together: More than three times a week    Attends religious service: More than 4 times per year    Active member of club or organization: No    Attends meetings of clubs or organizations: Never    Relationship status: Widowed  . Intimate partner violence:    Fear of current or ex partner: No    Emotionally abused: No    Physically abused: No    Forced sexual activity: No  Other Topics Concern  . Not on  file  Social History Narrative   Lives at [facility] since 12/23/14   Widowed   Never smoked   Alcohol 5 per week   Exercise 3 times a week classes, walking.    Activities Bridge, book groups   POA, Living Will   Still driving        Family History  Problem Relation Age of Onset  . Heart disease Mother   . Heart disease Father     Health Maintenance  Topic Date Due  . FOOT EXAM  08[birth year]19/1940  . OPHTHALMOLOGY EXAM  08[birth year]19/1940  . DEXA SCAN  08/20/1995  . INFLUENZA VACCINE  09/29/2017  . TETANUS/TDAP  11/24/2026 (Originally 08/19/1947)  . URINE MICROALBUMIN  12/31/2017  . HEMOGLOBIN A1C  02/07/2018  . PNA vac Low Risk Adult  Completed    Allergies  Allergen Reactions  . Metformin And Related Diarrhea    Outpatient Encounter Medications as of 10/10/2017  Medication Sig  .  atorvastatin (LIPITOR) 10 MG tablet Take 1 tablet (10 mg total) by mouth daily.  . calcium carbonate (OS-CAL) 600 MG tablet Take 1 tablet (600 mg total) by mouth 2 (two) times daily with a meal.  . Cholecalciferol (VITAMIN D3) 2000 units TABS Take by mouth. Take one Vitamin D daily  . levothyroxine (SYNTHROID, LEVOTHROID) 75 MCG tablet Take 1 tablet (75 mcg total) by mouth daily before breakfast.  . MULTIPLE VITAMIN PO Take by mouth. Take one vitamin daily  . Omega-3 1400 MG CAPS Take by mouth. Take one tablet 5  times a week  . temazepam (RESTORIL) 15 MG capsule Take one capsule by mouth nightly as needed for sleep  . [DISCONTINUED] metFORMIN (GLUCOPHAGE) 500 MG tablet Take 1 tablet (500 mg total) by mouth daily with breakfast. (Patient not taking: Reported on 10/10/2017)   No facility-administered encounter medications on file as of 10/10/2017.     Review of Systems  Constitutional: Negative for appetite change, chills and fever.  HENT: Negative for congestion, mouth sores and trouble swallowing.   Respiratory: Negative for shortness of breath.   Cardiovascular: Negative for chest pain.  Gastrointestinal: Negative for abdominal pain, blood in stool, constipation, diarrhea, nausea and vomiting.  Genitourinary: Negative for hematuria.  Skin: Negative for rash.    Vitals:   10/10/17 1510  BP: 124/64  Pulse: 80  Resp: 18  Temp: 98.2 F (36.8 C)  TempSrc: Oral  SpO2: 98%  Weight: 157 lb 6.4 oz (71.4 kg)  Height: 5' (1.524 m)   Body mass index is 30.74 kg/m. Physical Exam  Constitutional: She is oriented to person, place, and time. No distress.  Obese elderly female  HENT:  Head: Normocephalic and atraumatic.  Mouth/Throat: Oropharynx is clear and moist.  Eyes: Conjunctivae are normal. Right eye exhibits no discharge. Left eye exhibits no discharge.  Neck: Normal range of motion. Neck supple.  Cardiovascular: Normal rate and regular rhythm.  Pulmonary/Chest: Effort normal and  breath sounds normal.  Abdominal: Soft. Bowel sounds are normal. There is no tenderness.  Musculoskeletal: Normal range of motion.  Lymphadenopathy:    She has no cervical adenopathy.  Neurological: She is alert and oriented to person, place, and time.  Skin: Skin is warm and dry. She is not diaphoretic.  Psychiatric: She has a normal mood and affect.    Labs reviewed: Basic Metabolic Panel: Recent Labs    04/21/17 0750 06/01/17 0750 08/08/17 0810  NA 140 140 139  K 4.5 4.4 4.4  CL 105 104  103  CO2 28 28 27   GLUCOSE 125* 128* 132*  BUN 16 17 14   CREATININE 0.65 0.84 0.66  CALCIUM 8.9 9.3 9.0   Liver Function Tests: Recent Labs    04/21/17 0750 06/01/17 0750 08/08/17 0810  AST 16 16 15   ALT 14 13 13   BILITOT 0.3 0.3 0.3  PROT 6.6 6.8 6.7   No results for input(s): LIPASE, AMYLASE in the last 8760 hours. No results for input(s): AMMONIA in the last 8760 hours. CBC: Recent Labs    06/01/17 0750  WBC 10.5  NEUTROABS 6,678  HGB 13.0  HCT 40.4  MCV 79.2*  PLT 328   Cardiac Enzymes: No results for input(s): CKTOTAL, CKMB, CKMBINDEX, TROPONINI in the last 8760 hours. BNP: Invalid input(s): POCBNP Lab Results  Component Value Date   HGBA1C 6.8 (H) 08/08/2017   Lab Results  Component Value Date   TSH 2.12 08/08/2017   No results found for: VITAMINB12 No results found for: FOLATE No results found for: IRON, TIBC, FERRITIN  Lipid Panel: Recent Labs    04/21/17 0750 06/01/17 0750 08/08/17 0810  CHOL 169 159 172  HDL 35* 35* 37*  LDLCALC 105* 92 109*  TRIG 176* 220* 144  CHOLHDL 4.8 4.5 4.6   Lab Results  Component Value Date   HGBA1C 6.8 (H) 08/08/2017    Procedures since last visit: No results found.  Assessment/Plan  1. Loose stools Likely had side effect from metformin as symptoms have resolved after stopping metformin. D/c metformin for now  2. Adverse effect of drug, initial encounter With loose stool and fecal incontinence. D/c  metformin and list it in her allergy list.   3. Diabetes mellitus type 2 in obese ([place]) a1c reviewed. Discontinue metformin for now with drug reaction as above. Monitor clinically. Recheck a1c in 9/19. Goal a1c < 7.    Labs/tests ordered: none  Next appointment: has follow up  Communication: reviewed care plan with patient    Blanchie Serve, MD Internal Medicine [place], [place] [address] Cell Phone (Monday-Friday 8 am - 5 pm): [phone] On Call: [phone] and follow prompts after 5 pm and on weekends Office Phone: [phone] Office Fax: [phone]

## 2017-10-12 ENCOUNTER — Encounter: Payer: Self-pay | Admitting: Internal Medicine

## 2017-11-07 ENCOUNTER — Other Ambulatory Visit: Payer: Self-pay

## 2017-11-09 ENCOUNTER — Encounter: Payer: Self-pay | Admitting: Internal Medicine

## 2017-11-09 ENCOUNTER — Non-Acute Institutional Stay: Payer: Medicare Other | Admitting: Internal Medicine

## 2017-11-09 VITALS — BP 122/76 | HR 90 | Temp 98.1°F | Wt 157.8 lb

## 2017-11-09 DIAGNOSIS — E1169 Type 2 diabetes mellitus with other specified complication: Secondary | ICD-10-CM | POA: Insufficient documentation

## 2017-11-09 DIAGNOSIS — E669 Obesity, unspecified: Secondary | ICD-10-CM

## 2017-11-09 HISTORY — DX: Type 2 diabetes mellitus with other specified complication: E66.9

## 2017-11-09 NOTE — Progress Notes (Signed)
[facility]  Provider: Blanchie Serve MD   Location:  [place] of Service:  Clinic (12)  PCP: Blanchie Serve, MD Patient Care Team: Blanchie Serve, MD as PCP - General (Internal Medicine)  Extended Emergency Contact Information Primary Emergency Contact: Harris,Margaret Address: [address]          [place], [place] [address] [name] of [place] Phone: [phone] Mobile Phone: [phone] Relation: Daughter   Goals of Care: Advanced Directive information Advanced Directives 06/14/2017  Does Patient Have a Medical Advance Directive? Yes  Type of Advance Directive [place]  Does patient want to make changes to medical advance directive? -  Copy of [place] in Chart? No - copy requested      Chief Complaint  Patient presents with  . Acute Visit    pt has question about diabetes    HPI: [name] a 84 y.o. female seen today for acute visit. She mentions that she is worried about having diabetes. Her last a1c in 07/2017 was 6.8 and with her obesity and age, she was started on metformin. She did not tolerate that with GI upset with loose stools and abdominal discomfort. She is currently not on any oral hypoglycemic agent. She wants to know how she developed diabetes and why. She tries to avoid sugar most of the time. She tries to eat healthy and does group exercise classes 3 days a week and otherwise walks around the facility for exercise.   Past Medical History:  Diagnosis Date  . Hearing loss   . History of fracture of clavicle   . Hypothyroidism   . Insomnia   . Tinnitus    Past Surgical History:  Procedure Laterality Date  . CHOLECYSTECTOMY  2007   Dr. Verdene Lennert  . CLAVICLE EXCISION  1986  . MOHS SURGERY     past 10 years chest & legs    reports that she has never smoked. She has never used smokeless tobacco. She reports that she drinks alcohol. She reports that she does not use  drugs. Social History   Socioeconomic History  . Marital status: Widowed    Spouse name: Not on file  . Number of children: Not on file  . Years of education: Not on file  . Highest education level: Not on file  Occupational History  . Occupation: retired Web designer  Social Needs  . Financial resource strain: Not hard at all  . Food insecurity:    Worry: Never true    Inability: Never true  . Transportation needs:    Medical: No    Non-medical: No  Tobacco Use  . Smoking status: Never Smoker  . Smokeless tobacco: Never Used  Substance and Sexual Activity  . Alcohol use: Yes    Comment: 4 per week  . Drug use: No  . Sexual activity: Never  Lifestyle  . Physical activity:    Days per week: 7 days    Minutes per session: 30 min  . Stress: Only a little  Relationships  . Social connections:    Talks on phone: More than three times a week    Gets together: More than three times a week    Attends religious service: More than 4 times per year    Active member of club or organization: No    Attends meetings of clubs or organizations: Never    Relationship status: Widowed  . Intimate partner  violence:    Fear of current or ex partner: No    Emotionally abused: No    Physically abused: No    Forced sexual activity: No  Other Topics Concern  . Not on file  Social History Narrative   Lives at [facility] since 12/23/14   Widowed   Never smoked   Alcohol 5 per week   Exercise 3 times a week classes, walking.    Activities Bridge, book groups   POA, Living Will   Still driving        Family History  Problem Relation Age of Onset  . Heart disease Mother   . Heart disease Father     Health Maintenance  Topic Date Due  . FOOT EXAM  [birth year]19/1938  . OPHTHALMOLOGY EXAM  [birth year]19/1938  . DEXA SCAN  08/20/1995  . INFLUENZA VACCINE  09/29/2017  . TETANUS/TDAP  11/24/2026 (Originally [birth year]19/1947)  . URINE MICROALBUMIN  12/31/2017  . HEMOGLOBIN A1C  02/07/2018  .  PNA vac Low Risk Adult  Completed    Allergies  Allergen Reactions  . Metformin And Related Diarrhea    Outpatient Encounter Medications as of 11/09/2017  Medication Sig  . atorvastatin (LIPITOR) 10 MG tablet Take 1 tablet (10 mg total) by mouth daily.  . Cholecalciferol (VITAMIN D3) 2000 units TABS Take by mouth. Take one Vitamin D daily  . levothyroxine (SYNTHROID, LEVOTHROID) 75 MCG tablet Take 1 tablet (75 mcg total) by mouth daily before breakfast.  . MULTIPLE VITAMIN PO Take by mouth. Take one vitamin daily  . Omega-3 1400 MG CAPS Take by mouth. Take one tablet 5  times a week  . temazepam (RESTORIL) 15 MG capsule Take one capsule by mouth nightly as needed for sleep  . calcium carbonate (OS-CAL) 600 MG tablet Take 1 tablet (600 mg total) by mouth 2 (two) times daily with a meal. (Patient not taking: Reported on 11/09/2017)   No facility-administered encounter medications on file as of 11/09/2017.     Review of Systems  Constitutional: Negative for appetite change, chills and fever.  HENT: Negative for congestion.   Eyes: Negative for visual disturbance.  Respiratory: Negative for shortness of breath.   Cardiovascular: Negative for chest pain.  Gastrointestinal: Negative for abdominal pain, nausea and vomiting.  Genitourinary: Negative for dysuria.  Musculoskeletal: Negative for myalgias.  Neurological: Negative for dizziness and headaches.    Vitals:   11/09/17 1108  BP: 122/76  Pulse: 90  Temp: 98.1 F (36.7 C)  SpO2: 98%  Weight: 157 lb 12.8 oz (71.6 kg)   Body mass index is 30.82 kg/m.   Wt Readings from Last 3 Encounters:  11/09/17 157 lb 12.8 oz (71.6 kg)  10/10/17 157 lb 6.4 oz (71.4 kg)  09/07/17 159 lb 3.2 oz (72.2 kg)   Physical Exam  Constitutional: She is oriented to person, place, and time. No distress.  Obese elderly female  HENT:  Head: Normocephalic and atraumatic.  Mouth/Throat: Oropharynx is clear and moist.  Neck: Normal range of motion.  Neck supple.  Cardiovascular: Normal rate and regular rhythm.  Pulmonary/Chest: Effort normal and breath sounds normal.  Abdominal: Soft. Bowel sounds are normal. There is no tenderness.  Musculoskeletal: Normal range of motion. She exhibits no edema.  Lymphadenopathy:    She has no cervical adenopathy.  Neurological: She is alert and oriented to person, place, and time.  Skin: Skin is warm and dry. She is not diaphoretic.    Labs reviewed: Basic Metabolic Panel:  Recent Labs    04/21/17 0750 06/01/17 0750 08/08/17 0810  NA 140 140 139  K 4.5 4.4 4.4  CL 105 104 103  CO2 28 28 27   GLUCOSE 125* 128* 132*  BUN 16 17 14   CREATININE 0.65 0.84 0.66  CALCIUM 8.9 9.3 9.0   Liver Function Tests: Recent Labs    04/21/17 0750 06/01/17 0750 08/08/17 0810  AST 16 16 15   ALT 14 13 13   BILITOT 0.3 0.3 0.3  PROT 6.6 6.8 6.7   No results for input(s): LIPASE, AMYLASE in the last 8760 hours. No results for input(s): AMMONIA in the last 8760 hours. CBC: Recent Labs    06/01/17 0750  WBC 10.5  NEUTROABS 6,678  HGB 13.0  HCT 40.4  MCV 79.2*  PLT 328   Cardiac Enzymes: No results for input(s): CKTOTAL, CKMB, CKMBINDEX, TROPONINI in the last 8760 hours. BNP: Invalid input(s): POCBNP Lab Results  Component Value Date   HGBA1C 6.8 (H) 08/08/2017   Lab Results  Component Value Date   TSH 2.12 08/08/2017   No results found for: VITAMINB12 No results found for: FOLATE No results found for: IRON, TIBC, FERRITIN  Lipid Panel: Recent Labs    04/21/17 0750 06/01/17 0750 08/08/17 0810  CHOL 169 159 172  HDL 35* 35* 37*  LDLCALC 105* 92 109*  TRIG 176* 220* 144  CHOLHDL 4.8 4.5 4.6   Lab Results  Component Value Date   HGBA1C 6.8 (H) 08/08/2017    Procedures since last visit: No results found.  Assessment/Plan  1. Obesity (BMI 30-39.9) Has lost 2 lbs since last visit. Encouraged to exercise and reading material for DASH diet provided. Continue  levothyroxine.  2. Diabetes mellitus type 2 in obese ([place]) Off oral hypoglycemic agent. Does not want to try another agent until a repeat blood work. Scheduled for labs end of this month and based on reading can consider treatment. Diet and exercise counselling provided.    Labs/tests ordered:  Has pending labs  Next appointment: has f/u on 11/29/17   Blanchie Serve, MD Internal Medicine [facility] [place], [place] [address] Cell Phone (Monday-Friday 8 am - 5 pm): [phone] On Call: [phone] and follow prompts after 5 pm and on weekends Office Phone: [phone] Office Fax: [phone]

## 2017-11-09 NOTE — Patient Instructions (Signed)
DASH Eating Plan DASH stands for "Dietary Approaches to Stop Hypertension." The DASH eating plan is a healthy eating plan that has been shown to reduce high blood pressure (hypertension). It may also reduce your risk for type 2 diabetes, heart disease, and stroke. The DASH eating plan may also help with weight loss. What are tips for following this plan? General guidelines  Avoid eating more than 2,300 mg (milligrams) of salt (sodium) a day. If you have hypertension, you may need to reduce your sodium intake to 1,500 mg a day.  Limit alcohol intake to no more than 1 drink a day for nonpregnant women and 2 drinks a day for men. One drink equals 12 oz of beer, 5 oz of wine, or 1 oz of hard liquor.  Work with your health care provider to maintain a healthy body weight or to lose weight. Ask what an ideal weight is for you.  Get at least 30 minutes of exercise that causes your heart to beat faster (aerobic exercise) most days of the week. Activities may include walking, swimming, or biking.  Work with your health care provider or diet and nutrition specialist (dietitian) to adjust your eating plan to your individual calorie needs. Reading food labels  Check food labels for the amount of sodium per serving. Choose foods with less than 5 percent of the Daily Value of sodium. Generally, foods with less than 300 mg of sodium per serving fit into this eating plan.  To find whole grains, look for the word "whole" as the first word in the ingredient list. Shopping  Buy products labeled as "low-sodium" or "no salt added."  Buy fresh foods. Avoid canned foods and premade or frozen meals. Cooking  Avoid adding salt when cooking. Use salt-free seasonings or herbs instead of table salt or sea salt. Check with your health care provider or pharmacist before using salt substitutes.  Do not fry foods. Cook foods using healthy methods such as baking, boiling, grilling, and broiling instead.  Cook with  heart-healthy oils, such as olive, canola, soybean, or sunflower oil. Meal planning   Eat a balanced diet that includes: ? 5 or more servings of fruits and vegetables each day. At each meal, try to fill half of your plate with fruits and vegetables. ? Up to 6-8 servings of whole grains each day. ? Less than 6 oz of lean meat, poultry, or fish each day. A 3-oz serving of meat is about the same size as a deck of cards. One egg equals 1 oz. ? 2 servings of low-fat dairy each day. ? A serving of nuts, seeds, or beans 5 times each week. ? Heart-healthy fats. Healthy fats called Omega-3 fatty acids are found in foods such as flaxseeds and coldwater fish, like sardines, salmon, and mackerel.  Limit how much you eat of the following: ? Canned or prepackaged foods. ? Food that is high in trans fat, such as fried foods. ? Food that is high in saturated fat, such as fatty meat. ? Sweets, desserts, sugary drinks, and other foods with added sugar. ? Full-fat dairy products.  Do not salt foods before eating.  Try to eat at least 2 vegetarian meals each week.  Eat more home-cooked food and less restaurant, buffet, and fast food.  When eating at a restaurant, ask that your food be prepared with less salt or no salt, if possible. What foods are recommended? The items listed may not be a complete list. Talk with your dietitian about what   dietary choices are best for you. Grains Whole-grain or whole-wheat bread. Whole-grain or whole-wheat pasta. Brown rice. Oatmeal. Quinoa. Bulgur. Whole-grain and low-sodium cereals. Pita bread. Low-fat, low-sodium crackers. Whole-wheat flour tortillas. Vegetables Fresh or frozen vegetables (raw, steamed, roasted, or grilled). Low-sodium or reduced-sodium tomato and vegetable juice. Low-sodium or reduced-sodium tomato sauce and tomato paste. Low-sodium or reduced-sodium canned vegetables. Fruits All fresh, dried, or frozen fruit. Canned fruit in natural juice (without  added sugar). Meat and other protein foods Skinless chicken or turkey. Ground chicken or turkey. Pork with fat trimmed off. Fish and seafood. Egg whites. Dried beans, peas, or lentils. Unsalted nuts, nut butters, and seeds. Unsalted canned beans. Lean cuts of beef with fat trimmed off. Low-sodium, lean deli meat. Dairy Low-fat (1%) or fat-free (skim) milk. Fat-free, low-fat, or reduced-fat cheeses. Nonfat, low-sodium ricotta or cottage cheese. Low-fat or nonfat yogurt. Low-fat, low-sodium cheese. Fats and oils Soft margarine without trans fats. Vegetable oil. Low-fat, reduced-fat, or light mayonnaise and salad dressings (reduced-sodium). Canola, safflower, olive, soybean, and sunflower oils. Avocado. Seasoning and other foods Herbs. Spices. Seasoning mixes without salt. Unsalted popcorn and pretzels. Fat-free sweets. What foods are not recommended? The items listed may not be a complete list. Talk with your dietitian about what dietary choices are best for you. Grains Baked goods made with fat, such as croissants, muffins, or some breads. Dry pasta or rice meal packs. Vegetables Creamed or fried vegetables. Vegetables in a cheese sauce. Regular canned vegetables (not low-sodium or reduced-sodium). Regular canned tomato sauce and paste (not low-sodium or reduced-sodium). Regular tomato and vegetable juice (not low-sodium or reduced-sodium). Pickles. Olives. Fruits Canned fruit in a light or heavy syrup. Fried fruit. Fruit in cream or butter sauce. Meat and other protein foods Fatty cuts of meat. Ribs. Fried meat. Bacon. Sausage. Bologna and other processed lunch meats. Salami. Fatback. Hotdogs. Bratwurst. Salted nuts and seeds. Canned beans with added salt. Canned or smoked fish. Whole eggs or egg yolks. Chicken or turkey with skin. Dairy Whole or 2% milk, cream, and half-and-half. Whole or full-fat cream cheese. Whole-fat or sweetened yogurt. Full-fat cheese. Nondairy creamers. Whipped toppings.  Processed cheese and cheese spreads. Fats and oils Butter. Stick margarine. Lard. Shortening. Ghee. Bacon fat. Tropical oils, such as coconut, palm kernel, or palm oil. Seasoning and other foods Salted popcorn and pretzels. Onion salt, garlic salt, seasoned salt, table salt, and sea salt. Worcestershire sauce. Tartar sauce. Barbecue sauce. Teriyaki sauce. Soy sauce, including reduced-sodium. Steak sauce. Canned and packaged gravies. Fish sauce. Oyster sauce. Cocktail sauce. Horseradish that you find on the shelf. Ketchup. Mustard. Meat flavorings and tenderizers. Bouillon cubes. Hot sauce and Tabasco sauce. Premade or packaged marinades. Premade or packaged taco seasonings. Relishes. Regular salad dressings. Where to find more information:  National Heart, Lung, and Blood Institute: www.nhlbi.nih.gov  American Heart Association: www.heart.org Summary  The DASH eating plan is a healthy eating plan that has been shown to reduce high blood pressure (hypertension). It may also reduce your risk for type 2 diabetes, heart disease, and stroke.  With the DASH eating plan, you should limit salt (sodium) intake to 2,300 mg a day. If you have hypertension, you may need to reduce your sodium intake to 1,500 mg a day.  When on the DASH eating plan, aim to eat more fresh fruits and vegetables, whole grains, lean proteins, low-fat dairy, and heart-healthy fats.  Work with your health care provider or diet and nutrition specialist (dietitian) to adjust your eating plan to your individual   calorie needs. This information is not intended to replace advice given to you by your health care provider. Make sure you discuss any questions you have with your health care provider. Document Released: 02/04/2011 Document Revised: 02/09/2016 Document Reviewed: 02/09/2016 Elsevier Interactive Patient Education  2018 [employer]vier Inc.  

## 2017-11-23 DIAGNOSIS — E669 Obesity, unspecified: Secondary | ICD-10-CM | POA: Diagnosis not present

## 2017-11-23 DIAGNOSIS — E038 Other specified hypothyroidism: Secondary | ICD-10-CM | POA: Diagnosis not present

## 2017-11-23 DIAGNOSIS — E782 Mixed hyperlipidemia: Secondary | ICD-10-CM | POA: Diagnosis not present

## 2017-11-23 DIAGNOSIS — E1169 Type 2 diabetes mellitus with other specified complication: Secondary | ICD-10-CM | POA: Diagnosis not present

## 2017-11-24 ENCOUNTER — Other Ambulatory Visit: Payer: Medicare Other

## 2017-11-24 DIAGNOSIS — E1169 Type 2 diabetes mellitus with other specified complication: Secondary | ICD-10-CM

## 2017-11-24 DIAGNOSIS — E782 Mixed hyperlipidemia: Secondary | ICD-10-CM

## 2017-11-24 DIAGNOSIS — E038 Other specified hypothyroidism: Secondary | ICD-10-CM

## 2017-11-24 DIAGNOSIS — E669 Obesity, unspecified: Secondary | ICD-10-CM

## 2017-11-25 LAB — COMPLETE METABOLIC PANEL WITH GFR
AG Ratio: 1.5 (calc) (ref 1.0–2.5)
ALBUMIN MSPROF: 3.9 g/dL (ref 3.6–5.1)
ALKALINE PHOSPHATASE (APISO): 118 U/L (ref 33–130)
ALT: 14 U/L (ref 6–29)
AST: 17 U/L (ref 10–35)
BILIRUBIN TOTAL: 0.3 mg/dL (ref 0.2–1.2)
BUN: 16 mg/dL (ref 7–25)
CHLORIDE: 104 mmol/L (ref 98–110)
CO2: 27 mmol/L (ref 20–32)
Calcium: 9.6 mg/dL (ref 8.6–10.4)
Creat: 0.7 mg/dL (ref 0.60–0.88)
GFR, Est African American: 90 mL/min/{1.73_m2} (ref >=60)
GFR, Est Non African American: 78 mL/min/{1.73_m2} (ref >=60)
GLUCOSE: 135 mg/dL — AB (ref 65–99)
Globulin: 2.6 g/dL (calc) (ref 1.9–3.7)
Potassium: 4.6 mmol/L (ref 3.5–5.3)
Sodium: 141 mmol/L (ref 135–146)
Total Protein: 6.5 g/dL (ref 6.1–8.1)

## 2017-11-25 LAB — MICROALBUMIN / CREATININE URINE RATIO
CREATININE, URINE: 73 mg/dL (ref 20–275)
MICROALB UR: 0.7 mg/dL
Microalb Creat Ratio: 10 mcg/mg creat (ref <30)

## 2017-11-25 LAB — LIPID PANEL
CHOL/HDL RATIO: 3.3 (calc) (ref <5.0)
Cholesterol: 113 mg/dL (ref <200)
HDL: 34 mg/dL — ABNORMAL LOW (ref >50)
LDL CHOLESTEROL (CALC): 58 mg/dL
Non-HDL Cholesterol (Calc): 79 mg/dL (calc) (ref <130)
TRIGLYCERIDES: 133 mg/dL (ref <150)

## 2017-11-25 LAB — HEMOGLOBIN A1C
HEMOGLOBIN A1C: 6.6 %{Hb} — AB (ref <5.7)
Mean Plasma Glucose: 143 (calc)
eAG (mmol/L): 7.9 (calc)

## 2017-11-25 LAB — TSH: TSH: 1.59 mIU/L (ref 0.40–4.50)

## 2017-11-28 ENCOUNTER — Encounter: Payer: Self-pay | Admitting: Internal Medicine

## 2017-11-29 ENCOUNTER — Encounter: Payer: Self-pay | Admitting: Family

## 2017-11-29 ENCOUNTER — Non-Acute Institutional Stay: Payer: Medicare Other | Admitting: Family

## 2017-11-29 VITALS — BP 122/78 | HR 88 | Temp 98.1°F | Resp 17 | Ht 60.0 in | Wt 156.4 lb

## 2017-11-29 DIAGNOSIS — E669 Obesity, unspecified: Secondary | ICD-10-CM

## 2017-11-29 DIAGNOSIS — E559 Vitamin D deficiency, unspecified: Secondary | ICD-10-CM | POA: Diagnosis not present

## 2017-11-29 DIAGNOSIS — E782 Mixed hyperlipidemia: Secondary | ICD-10-CM | POA: Diagnosis not present

## 2017-11-29 DIAGNOSIS — E1169 Type 2 diabetes mellitus with other specified complication: Secondary | ICD-10-CM

## 2017-11-29 DIAGNOSIS — E039 Hypothyroidism, unspecified: Secondary | ICD-10-CM

## 2017-11-29 MED ORDER — ATORVASTATIN CALCIUM 10 MG PO TABS: 5.0000 mg | ORAL_TABLET | Freq: Every day | ORAL | 3 refills | Status: DC

## 2017-11-29 NOTE — Patient Instructions (Signed)
1.Reduce Lipitor from 10 mg tablet to 5 mg tablet daily 2. Continue to exercise and eat well balance diet low carbohydrates,low sweats,low fat and high vegetables diet.  3. Follow up in 3 months.Have lab work drawn one week prior to visit.  4. Please reschedule your appointment for Bone density to be done at [facility] on [address] as ordered in previous visit by Dr.Pandey.

## 2017-11-29 NOTE — Progress Notes (Signed)
Location:  [facility] (12) Provider: Delara Shepheard FNP-C  Blanchie Serve, MD  Patient Care Team: Blanchie Serve, MD as PCP - General (Internal Medicine)  Extended Emergency Contact Information Primary Emergency Contact: Harris,Margaret Address: [address]          [place], [place] [address] [name] of [place] Phone: [phone] Mobile Phone: [phone] Relation: Daughter  Goals of care: Advanced Directive information Advanced Directives 06/14/2017  Does Patient Have a Medical Advance Directive? Yes  Type of Advance Directive [place]  Does patient want to make changes to medical advance directive? -  Copy of [place] in Chart? No - copy requested     Chief Complaint  Patient presents with  . Medical Management of Chronic Issues    Resident is being seen for a routine visit.     HPI:  Pt is a 84 y.o. female seen today at [facility] for medical management of chronic issues.she denies any acute issues during visit.Had recent labs done 11/23/2017 results reviewed with patient this visit.  Type 2 DM; Previously on metformin but stopped taking due toloose stool.she does not check her blood sugars.she does watch what she eats and participates in the facility activities.Her recent A1C was 6.6 (11/23/2017) previous was 6.8.she denies any symptoms of hypo/hyperglycemia.   Hypothyroidism - currently on levothyroxine 75 mcg tablet daily.Recent TSH level within target range.  Hyperlipidemia- Taking Atorvastatin 10 mg tablet daily.Also on Omega -3 capsule five times per week.she is careful with her diet and continues with facility exercises and walks on the hallways.she does states eats high carbohydrates. Her recent chol 113,HDL 34,TRG 133 and LDL 58 ( 11/23/2017).she has had no recent weight gain.  Vitamin D deficiency- she states has stopped taking her vit D and calcium supplements.she  wonders whether the supplements are necessary since she eats all her meals.On note review she was seen by Dr.pandey in 07/2017 Bone Density was ordered then.per patient she states didn't know anything.patient's daughter called by CMA daughter states appointment cancelled twice but will reschedule it.No recent fall episodes.    Past Medical History:  Diagnosis Date  . Hearing loss   . History of fracture of clavicle   . Hypothyroidism   . Insomnia   . Tinnitus    Past Surgical History:  Procedure Laterality Date  . CHOLECYSTECTOMY  2007   Dr. Verdene Lennert  . CLAVICLE EXCISION  1986  . MOHS SURGERY     past 10 years chest & legs    Allergies  Allergen Reactions  . Metformin And Related Diarrhea    Outpatient Encounter Medications as of 11/29/2017  Medication Sig  . atorvastatin (LIPITOR) 10 MG tablet Take 1 tablet (10 mg total) by mouth daily.  Marland Kitchen levothyroxine (SYNTHROID, LEVOTHROID) 75 MCG tablet Take 1 tablet (75 mcg total) by mouth daily before breakfast.  . Omega-3 1400 MG CAPS Take by mouth. Take one tablet 5  times a week  . temazepam (RESTORIL) 15 MG capsule Take one capsule by mouth nightly as needed for sleep  . calcium carbonate (OS-CAL) 600 MG tablet Take 1 tablet (600 mg total) by mouth 2 (two) times daily with a meal. (Patient not taking: Reported on 11/09/2017)  . Cholecalciferol (VITAMIN D3) 2000 units TABS Take by mouth. Take one Vitamin D daily  . MULTIPLE VITAMIN PO Take by mouth. Take one vitamin daily   No facility-administered encounter medications on file  as of 11/29/2017.     Review of Systems  Constitutional: Negative for appetite change, chills, fatigue and fever.  HENT: Positive for hearing loss. Negative for congestion, sinus pressure, sinus pain, sneezing and sore throat.   Eyes: Positive for visual disturbance. Negative for discharge, redness and itching.       Hx cataracts.wears contacts.  Respiratory: Negative for cough, chest tightness, shortness of breath  and wheezing.   Cardiovascular: Negative for chest pain, palpitations and leg swelling.  Gastrointestinal: Positive for constipation. Negative for abdominal distention, abdominal pain, diarrhea, nausea and vomiting.  Endocrine: Negative for polydipsia, polyphagia and polyuria.       Hx hypothyroidism  Genitourinary: Negative for dysuria, flank pain and urgency.  Skin: Negative for color change, pallor and rash.  Neurological: Negative for dizziness, light-headedness and headaches.  Psychiatric/Behavioral: Negative for agitation and sleep disturbance. The patient is not nervous/anxious.     Immunization History  Administered Date(s) Administered  . DTaP 07/20/2013  . Influenza, High Dose Seasonal PF 12/07/2016  . Influenza-Unspecified 11/20/2014, 12/12/2015  . PPD Test 08/06/2014  . Pneumococcal Conjugate-13 11/07/2013  . Pneumococcal Polysaccharide-23 03/22/2017  . Zoster 03/01/2006  . Zoster Recombinat (Shingrix) 06/07/2017, 08/26/2017   Pertinent  Health Maintenance Due  Topic Date Due  . FOOT EXAM  08/19/1938  . OPHTHALMOLOGY EXAM  [birth year]19/1938  . DEXA SCAN  08/20/1995  . INFLUENZA VACCINE  12/29/2017 (Originally 09/29/2017)  . HEMOGLOBIN A1C  05/24/2018  . URINE MICROALBUMIN  11/24/2018  . PNA vac Low Risk Adult  Completed   Fall Risk  11/29/2017 06/03/2017 01/19/2017 11/23/2016 09/09/2015  Falls in the past year? No Yes No No No  Number falls in past yr: - 1 - - -  Injury with Fall? - No - - -    Vitals:   11/29/17 1336  BP: 122/78  Pulse: 88  Resp: 17  Temp: 98.1 F (36.7 C)  TempSrc: Oral  SpO2: 99%  Weight: 156 lb 6.4 oz (70.9 kg)  Height: 5' (1.524 m)   Body mass index is 30.54 kg/m. Physical Exam  Constitutional: She is oriented to person, place, and time. She appears well-developed and well-nourished.  Obese,elderly in no acute distress   HENT:  Head: Normocephalic.  Right Ear: External ear normal.  Left Ear: External ear normal.  Mouth/Throat: Oropharynx  is clear and moist. No oropharyngeal exudate.  Bilateral Hearing aids in place   Eyes: Pupils are equal, round, and reactive to light. Conjunctivae and EOM are normal. Right eye exhibits no discharge. Left eye exhibits no discharge. No scleral icterus.  Neck: Normal range of motion. No JVD present. No thyromegaly present.  Cardiovascular: Normal rate, regular rhythm, normal heart sounds and intact distal pulses. Exam reveals no gallop and no friction rub.  No murmur heard. Pulmonary/Chest: Effort normal and breath sounds normal. No respiratory distress. She has no wheezes. She has no rales.  Abdominal: Soft. Bowel sounds are normal. She exhibits no distension and no mass. There is no tenderness. There is no rebound and no guarding.  Musculoskeletal: Normal range of motion. She exhibits no edema or tenderness.  Lymphadenopathy:    She has no cervical adenopathy.  Neurological: She is oriented to person, place, and time.  Skin: Skin is warm and dry. Capillary refill takes 2 to 3 seconds. No rash noted. No erythema. No pallor.  Psychiatric: She has a normal mood and affect. Her speech is normal and behavior is normal. Judgment and thought content normal.  Vitals reviewed.  Labs reviewed: Recent Labs    06/01/17 0750 08/08/17 0810 11/23/17 0000  NA 140 139 141  K 4.4 4.4 4.6  CL 104 103 104  CO2 28 27 27   GLUCOSE 128* 132* 135*  BUN 17 14 16   CREATININE 0.84 0.66 0.70  CALCIUM 9.3 9.0 9.6   Recent Labs    06/01/17 0750 08/08/17 0810 11/23/17 0000  AST 16 15 17   ALT 13 13 14   BILITOT 0.3 0.3 0.3  PROT 6.8 6.7 6.5   Recent Labs    06/01/17 0750  WBC 10.5  NEUTROABS 6,678  HGB 13.0  HCT 40.4  MCV 79.2*  PLT 328   Lab Results  Component Value Date   TSH 1.59 11/23/2017   Lab Results  Component Value Date   HGBA1C 6.6 (H) 11/23/2017   Lab Results  Component Value Date   CHOL 113 11/23/2017   HDL 34 (L) 11/23/2017   LDLCALC 58 11/23/2017   TRIG 133 11/23/2017     CHOLHDL 3.3 11/23/2017    Significant Diagnostic Results in last 30 days:  No results found.  Assessment/Plan 1. Diabetes mellitus type 2 in obese  Lab Results  Component Value Date   HGBA1C 6.6 (H) 11/23/2017   Currently off medications.Not check finger stick.continue on low carbo,low fat and n sweets diet.continue to exercise at least three times per week.will recheck Hgb A1C,CBC/diff and CMP in 3 months.    2. Mixed hyperlipidemia LDL at goal 58 ( 11/23/2017).Reduce Atorvastatin 10 mg tablet to 1/2 Tablet ( 5 mg tablet) by mouth daily.continue on Omega 3 one capsule five times a week.continue low carbohydrates,low fat and no sweets diet.will recheck fasting lipid panel in 3 months.   3. Hypothyroidism Lab Results  Component Value Date   TSH 1.59 11/23/2017  Continue on levothyroxine 75 mcg tablet daily.Recheck TSH level in 3 months.  4. Vitamin D deficiency  Per patient has stopped taking vitamin D and calcium supplements since she eats all her meals.Encouraged to continue taking supplements until she gets her previous ordered bone density done.Patient's daughter notified of previous Bone density not done.she will reschedule bone density appointment with [place].     Family/ staff Communication: Reviewed plan of care with patient.  Labs/tests ordered: CBC/diff,CMP,TSH level,Hgb A1C and fasting Lipid panel level in 3 months.  Follow up appointment: 3 months labs one week prior to appointment.   Sandrea Hughs, NP

## 2018-02-14 ENCOUNTER — Other Ambulatory Visit: Payer: Self-pay

## 2018-03-06 ENCOUNTER — Other Ambulatory Visit: Payer: Medicare Other

## 2018-03-06 DIAGNOSIS — E669 Obesity, unspecified: Secondary | ICD-10-CM

## 2018-03-06 DIAGNOSIS — E782 Mixed hyperlipidemia: Secondary | ICD-10-CM | POA: Diagnosis not present

## 2018-03-06 DIAGNOSIS — E039 Hypothyroidism, unspecified: Secondary | ICD-10-CM

## 2018-03-06 DIAGNOSIS — E1169 Type 2 diabetes mellitus with other specified complication: Secondary | ICD-10-CM

## 2018-03-07 LAB — CBC WITH DIFFERENTIAL/PLATELET
Absolute Monocytes: 689 cells/uL (ref 200–950)
Basophils Absolute: 68 cells/uL (ref 0–200)
Basophils Relative: 0.7 %
EOS PCT: 2.9 %
Eosinophils Absolute: 281 cells/uL (ref 15–500)
HEMATOCRIT: 39.1 % (ref 35.0–45.0)
Hemoglobin: 12.7 g/dL (ref 11.7–15.5)
LYMPHS ABS: 2706 {cells}/uL (ref 850–3900)
MCH: 25.2 pg — ABNORMAL LOW (ref 27.0–33.0)
MCHC: 32.5 g/dL (ref 32.0–36.0)
MCV: 77.7 fL — ABNORMAL LOW (ref 80.0–100.0)
MPV: 10.2 fL (ref 7.5–12.5)
Monocytes Relative: 7.1 %
Neutro Abs: 5956 cells/uL (ref 1500–7800)
Neutrophils Relative %: 61.4 %
Platelets: 323 10*3/uL (ref 140–400)
RBC: 5.03 10*6/uL (ref 3.80–5.10)
RDW: 14.1 % (ref 11.0–15.0)
Total Lymphocyte: 27.9 %
WBC: 9.7 10*3/uL (ref 3.8–10.8)

## 2018-03-07 LAB — COMPLETE METABOLIC PANEL WITH GFR
AG Ratio: 1.5 (calc) (ref 1.0–2.5)
ALBUMIN MSPROF: 3.8 g/dL (ref 3.6–5.1)
ALKALINE PHOSPHATASE (APISO): 113 U/L (ref 33–130)
ALT: 12 U/L (ref 6–29)
AST: 13 U/L (ref 10–35)
BUN: 13 mg/dL (ref 7–25)
CHLORIDE: 105 mmol/L (ref 98–110)
CO2: 28 mmol/L (ref 20–32)
Calcium: 8.9 mg/dL (ref 8.6–10.4)
Creat: 0.7 mg/dL (ref 0.60–0.88)
GFR, Est African American: 90 mL/min/{1.73_m2} (ref >=60)
GFR, Est Non African American: 78 mL/min/{1.73_m2} (ref >=60)
GLUCOSE: 135 mg/dL — AB (ref 65–99)
Globulin: 2.6 g/dL (calc) (ref 1.9–3.7)
Potassium: 4.4 mmol/L (ref 3.5–5.3)
Sodium: 140 mmol/L (ref 135–146)
Total Bilirubin: 0.3 mg/dL (ref 0.2–1.2)
Total Protein: 6.4 g/dL (ref 6.1–8.1)

## 2018-03-07 LAB — TSH: TSH: 2.88 mIU/L (ref 0.40–4.50)

## 2018-03-07 LAB — LIPID PANEL
CHOL/HDL RATIO: 4.1 (calc) (ref <5.0)
Cholesterol: 135 mg/dL (ref <200)
HDL: 33 mg/dL — ABNORMAL LOW (ref >50)
LDL CHOLESTEROL (CALC): 77 mg/dL
Non-HDL Cholesterol (Calc): 102 mg/dL (calc) (ref <130)
Triglycerides: 151 mg/dL — ABNORMAL HIGH (ref <150)

## 2018-03-07 LAB — HEMOGLOBIN A1C
Hgb A1c MFr Bld: 7.1 % of total Hgb — ABNORMAL HIGH (ref <5.7)
MEAN PLASMA GLUCOSE: 157 (calc)
eAG (mmol/L): 8.7 (calc)

## 2018-03-14 ENCOUNTER — Encounter: Payer: Self-pay | Admitting: Family

## 2018-03-15 ENCOUNTER — Encounter: Payer: Self-pay | Admitting: Internal Medicine

## 2018-03-15 ENCOUNTER — Non-Acute Institutional Stay: Payer: Medicare Other | Admitting: Internal Medicine

## 2018-03-15 VITALS — BP 134/78 | HR 95 | Temp 97.6°F | Ht 60.0 in | Wt 158.8 lb

## 2018-03-15 DIAGNOSIS — E669 Obesity, unspecified: Secondary | ICD-10-CM

## 2018-03-15 DIAGNOSIS — E1169 Type 2 diabetes mellitus with other specified complication: Secondary | ICD-10-CM

## 2018-03-15 DIAGNOSIS — E038 Other specified hypothyroidism: Secondary | ICD-10-CM

## 2018-03-15 DIAGNOSIS — R7303 Prediabetes: Secondary | ICD-10-CM | POA: Diagnosis not present

## 2018-03-15 DIAGNOSIS — F5101 Primary insomnia: Secondary | ICD-10-CM | POA: Diagnosis not present

## 2018-03-15 DIAGNOSIS — E782 Mixed hyperlipidemia: Secondary | ICD-10-CM | POA: Diagnosis not present

## 2018-03-15 NOTE — Progress Notes (Signed)
Location:  [place] of Service:  Clinic (12)  Provider:   Code Status:  Goals of Care:  Advanced Directives 03/15/2018  Does Patient Have a Medical Advance Directive? No  Type of Advance Directive -  Does patient want to make changes to medical advance directive? -  Copy of [place] in Chart? -  Would patient like information on creating a medical advance directive? No - Patient declined     Chief Complaint  Patient presents with  . Medical Management of Chronic Issues    3 month follow up, discuss lab results.due for eye exam     HPI: [name] a 85 y.o. female seen today for medical management of chronic diseases.   Patient has h/o Diabetes, Hypertension, Hypothyroidism, Hyperlipidemia She came for her Routine 3 month follow up. She also wanted refill on her Restoril for PRN help with Insomnia. Her daughter was also in the room. They did nto have any new Complains. Patient lives in independent [place]. Is Still driving. Does not use any Cane. Exercise regularly.    Past Medical History:  Diagnosis Date  . Hearing loss   . History of fracture of clavicle   . Hypothyroidism   . Insomnia   . Tinnitus     Past Surgical History:  Procedure Laterality Date  . CHOLECYSTECTOMY  2007   Dr. Verdene Lennert  . CLAVICLE EXCISION  1986  . MOHS SURGERY     past 10 years chest & legs    Allergies  Allergen Reactions  . Metformin And Related Diarrhea    Outpatient Encounter Medications as of 03/15/2018  Medication Sig  . atorvastatin (LIPITOR) 10 MG tablet Take 0.5 tablets (5 mg total) by mouth daily.  Marland Kitchen levothyroxine (SYNTHROID, LEVOTHROID) 75 MCG tablet Take 1 tablet (75 mcg total) by mouth daily before breakfast.  . temazepam (RESTORIL) 15 MG capsule Take one capsule by mouth nightly as needed for sleep  . calcium carbonate (OS-CAL) 600 MG tablet Take 1 tablet (600 mg total) by mouth 2 (two) times daily with a meal. (Patient not  taking: Reported on 11/09/2017)  . Cholecalciferol (VITAMIN D3) 2000 units TABS Take by mouth. Take one Vitamin D daily  . MULTIPLE VITAMIN PO Take by mouth. Take one vitamin daily  . Omega-3 1400 MG CAPS Take by mouth. Take one tablet 5  times a week   No facility-administered encounter medications on file as of 03/15/2018.     Review of Systems:  Review of Systems  Review of Systems  Constitutional: Negative for activity change, appetite change, chills, diaphoresis, fatigue and fever.  HENT: Negative for mouth sores, postnasal drip, rhinorrhea, sinus pain and sore throat.   Respiratory: Negative for apnea, cough, chest tightness, shortness of breath and wheezing.   Cardiovascular: Negative for chest pain, palpitations and leg swelling.  Gastrointestinal: Negative for abdominal distention, abdominal pain, constipation, diarrhea, nausea and vomiting.  Genitourinary: Negative for dysuria and frequency.  Musculoskeletal: Negative for arthralgias, joint swelling and myalgias.  Skin: Negative for rash.  Neurological: Negative for dizziness, syncope, weakness, light-headedness and numbness.  Psychiatric/Behavioral: Negative for behavioral problems, confusion and sleep disturbance.     Health Maintenance  Topic Date Due  . FOOT EXAM  08/19/1938  . OPHTHALMOLOGY EXAM  08/19/1938  . DEXA SCAN  08/20/1995  . TETANUS/TDAP  11/24/2026 (Originally 08/19/1947)  . HEMOGLOBIN A1C  09/04/2018  . URINE MICROALBUMIN  11/24/2018  . INFLUENZA VACCINE  Completed  .  PNA vac Low Risk Adult  Completed    Physical Exam: Vitals:   03/15/18 1331  BP: 134/78  Pulse: 95  Temp: 97.6 F (36.4 C)  TempSrc: Oral  SpO2: 98%  Weight: 158 lb 12.8 oz (72 kg)  Height: 5' (1.524 m)   Body mass index is 31.01 kg/m. Physical Exam Constitutional:      Appearance: Normal appearance.  HENT:     Head: Normocephalic.     Nose: Nose normal.  Eyes:     Pupils: Pupils are equal, round, and reactive to light.    Neck:     Musculoskeletal: Neck supple.  Cardiovascular:     Rate and Rhythm: Normal rate and regular rhythm.     Pulses: Normal pulses.     Heart sounds: Normal heart sounds.  Pulmonary:     Effort: Pulmonary effort is normal. No respiratory distress.     Breath sounds: Normal breath sounds. No wheezing or rales.  Abdominal:     General: Abdomen is flat. Bowel sounds are normal. There is no distension.     Palpations: Abdomen is soft.     Tenderness: There is no abdominal tenderness. There is no guarding.  Musculoskeletal:        General: No swelling.  Skin:    General: Skin is warm and dry.  Neurological:     General: No focal deficit present.     Mental Status: She is alert and oriented to person, place, and time.  Psychiatric:        Mood and Affect: Mood normal.        Thought Content: Thought content normal.        Judgment: Judgment normal.     Labs reviewed: Basic Metabolic Panel: Recent Labs    08/08/17 0810 11/23/17 0000 03/06/18 0810  NA 139 141 140  K 4.4 4.6 4.4  CL 103 104 105  CO2 27 27 28   GLUCOSE 132* 135* 135*  BUN 14 16 13   CREATININE 0.66 0.70 0.70  CALCIUM 9.0 9.6 8.9  TSH 2.12 1.59 2.88   Liver Function Tests: Recent Labs    08/08/17 0810 11/23/17 0000 03/06/18 0810  AST 15 17 13   ALT 13 14 12   BILITOT 0.3 0.3 0.3  PROT 6.7 6.5 6.4   No results for input(s): LIPASE, AMYLASE in the last 8760 hours. No results for input(s): AMMONIA in the last 8760 hours. CBC: Recent Labs    06/01/17 0750 03/06/18 0810  WBC 10.5 9.7  NEUTROABS 6,678 5,956  HGB 13.0 12.7  HCT 40.4 39.1  MCV 79.2* 77.7*  PLT 328 323   Lipid Panel: Recent Labs    08/08/17 0810 11/23/17 0000 03/06/18 0810  CHOL 172 113 135  HDL 37* 34* 33*  LDLCALC 109* 58 77  TRIG 144 133 151*  CHOLHDL 4.6 3.3 4.1   Lab Results  Component Value Date   HGBA1C 7.1 (H) 03/06/2018    Procedures since last visit: No results found.  Assessment/Plan Diabetes  [place] A1C 7.1 Patient continues to be Diet Control and Exercise  1.  Hypothyroidism Same Dose levothyroxine (SYNTHROID, LEVOTHROID) 75 MCG tablet; Take 1 tablet (75 mcg total) by mouth daily before breakfast.  Dispense: 90 tablet; Refill: 3  2. Primary insomnia Continue on Restoril  - temazepam (RESTORIL) 15 MG capsule; Take one capsule by mouth nightly as needed for sleep  Dispense: 15 capsule; Refill: 0 Hyperlipidemia Continue on Statin LDL 77   Labs/tests ordered:  Follow up  Lab in 3 months Next appt:  06/12/2018   Total time spent in this patient care encounter was 40_ minutes; greater than 50% of the visit spent counseling patient, reviewing records , Labs and coordinating care for problems addressed at this encounter.

## 2018-03-17 ENCOUNTER — Ambulatory Visit
Admission: RE | Admit: 2018-03-17 | Discharge: 2018-03-17 | Disposition: A | Discharge status: Home / Self Care | Payer: Medicare Other | Source: Ambulatory Visit | Attending: Internal Medicine | Admitting: Internal Medicine

## 2018-03-17 DIAGNOSIS — E2839 Other primary ovarian failure: Secondary | ICD-10-CM

## 2018-03-17 DIAGNOSIS — Z780 Asymptomatic menopausal state: Secondary | ICD-10-CM | POA: Diagnosis not present

## 2018-03-17 DIAGNOSIS — M85851 Other specified disorders of bone density and structure, right thigh: Secondary | ICD-10-CM | POA: Diagnosis not present

## 2018-03-17 MED ORDER — LEVOTHYROXINE SODIUM 75 MCG PO TABS: 75.0000 ug | ORAL_TABLET | Freq: Every day | ORAL | 3 refills | Status: DC

## 2018-03-17 MED ORDER — TEMAZEPAM 15 MG PO CAPS: ORAL_CAPSULE | ORAL | 0 refills | Status: DC

## 2018-04-18 ENCOUNTER — Telehealth: Payer: Self-pay

## 2018-04-18 NOTE — Telephone Encounter (Signed)
Call patient left voicemail to return call to office

## 2018-04-18 NOTE — Telephone Encounter (Signed)
-----   Message from [name], MD sent at 04/14/2018  4:39 PM EST ----- Regarding: RE: Dexa scan Tell Her that Her bones are weak but not that much that she needs Treatment. She needs to continue on Vit D and Calcium ----- Message ----- From: Lynnae Prude, CMA Sent: 04/14/2018  12:20 PM EST To: [name], MD Subject: Dexa scan                                      Patient stopped by the clinic to get the results of her bone density scan that was done back in January.Can you take a look at it and let me know what I should tell her?

## 2018-04-26 NOTE — Telephone Encounter (Signed)
Call placed to patient she is aware of her DEXA scan report

## 2018-05-18 DIAGNOSIS — Z570 Occupational exposure to noise: Secondary | ICD-10-CM | POA: Diagnosis not present

## 2018-05-18 DIAGNOSIS — Z822 Family history of deafness and hearing loss: Secondary | ICD-10-CM | POA: Diagnosis not present

## 2018-05-18 DIAGNOSIS — H903 Sensorineural hearing loss, bilateral: Secondary | ICD-10-CM | POA: Diagnosis not present

## 2018-06-12 ENCOUNTER — Other Ambulatory Visit: Payer: Medicare Other

## 2018-06-12 ENCOUNTER — Other Ambulatory Visit: Payer: Self-pay

## 2018-06-12 DIAGNOSIS — R7303 Prediabetes: Secondary | ICD-10-CM

## 2018-06-13 LAB — HEMOGLOBIN A1C
Hgb A1c MFr Bld: 7.1 % of total Hgb — ABNORMAL HIGH (ref <5.7)
Mean Plasma Glucose: 157 (calc)
eAG (mmol/L): 8.7 (calc)

## 2018-06-13 LAB — COMPLETE METABOLIC PANEL WITH GFR
AG Ratio: 1.4 (calc) (ref 1.0–2.5)
ALT: 21 U/L (ref 6–29)
AST: 23 U/L (ref 10–35)
Albumin: 3.9 g/dL (ref 3.6–5.1)
Alkaline phosphatase (APISO): 134 U/L (ref 37–153)
BUN: 14 mg/dL (ref 7–25)
CO2: 29 mmol/L (ref 20–32)
Calcium: 8.9 mg/dL (ref 8.6–10.4)
Chloride: 104 mmol/L (ref 98–110)
Creat: 0.72 mg/dL (ref 0.60–0.88)
GFR, Est African American: 87 mL/min/{1.73_m2} (ref >=60)
GFR, Est Non African American: 75 mL/min/{1.73_m2} (ref >=60)
Globulin: 2.7 g/dL (calc) (ref 1.9–3.7)
Glucose, Bld: 160 mg/dL — ABNORMAL HIGH (ref 65–99)
Potassium: 4.2 mmol/L (ref 3.5–5.3)
Sodium: 140 mmol/L (ref 135–146)
Total Bilirubin: 0.4 mg/dL (ref 0.2–1.2)
Total Protein: 6.6 g/dL (ref 6.1–8.1)

## 2018-06-13 LAB — CBC WITH DIFFERENTIAL/PLATELET
Absolute Monocytes: 694 cells/uL (ref 200–950)
Basophils Absolute: 61 cells/uL (ref 0–200)
Basophils Relative: 0.6 %
Eosinophils Absolute: 347 cells/uL (ref 15–500)
Eosinophils Relative: 3.4 %
HCT: 40.3 % (ref 35.0–45.0)
Hemoglobin: 13.3 g/dL (ref 11.7–15.5)
Lymphs Abs: 2652 cells/uL (ref 850–3900)
MCH: 26.4 pg — ABNORMAL LOW (ref 27.0–33.0)
MCHC: 33 g/dL (ref 32.0–36.0)
MCV: 80.1 fL (ref 80.0–100.0)
MPV: 10 fL (ref 7.5–12.5)
Monocytes Relative: 6.8 %
Neutro Abs: 6446 cells/uL (ref 1500–7800)
Neutrophils Relative %: 63.2 %
Platelets: 295 10*3/uL (ref 140–400)
RBC: 5.03 10*6/uL (ref 3.80–5.10)
RDW: 14.9 % (ref 11.0–15.0)
Total Lymphocyte: 26 %
WBC: 10.2 10*3/uL (ref 3.8–10.8)

## 2018-06-14 ENCOUNTER — Other Ambulatory Visit: Payer: Self-pay

## 2018-06-14 ENCOUNTER — Non-Acute Institutional Stay: Payer: Medicare Other | Admitting: Internal Medicine

## 2018-06-14 ENCOUNTER — Encounter: Payer: Self-pay | Admitting: Internal Medicine

## 2018-06-14 VITALS — BP 132/84 | HR 88 | Temp 97.8°F | Ht 60.0 in | Wt 158.8 lb

## 2018-06-14 DIAGNOSIS — E669 Obesity, unspecified: Secondary | ICD-10-CM | POA: Diagnosis not present

## 2018-06-14 DIAGNOSIS — E038 Other specified hypothyroidism: Secondary | ICD-10-CM | POA: Diagnosis not present

## 2018-06-14 DIAGNOSIS — E782 Mixed hyperlipidemia: Secondary | ICD-10-CM | POA: Diagnosis not present

## 2018-06-14 DIAGNOSIS — M8580 Other specified disorders of bone density and structure, unspecified site: Secondary | ICD-10-CM

## 2018-06-14 DIAGNOSIS — E1169 Type 2 diabetes mellitus with other specified complication: Secondary | ICD-10-CM | POA: Diagnosis not present

## 2018-06-14 DIAGNOSIS — F5101 Primary insomnia: Secondary | ICD-10-CM | POA: Diagnosis not present

## 2018-06-14 NOTE — Progress Notes (Signed)
Location: [place] of Service:  Clinic (12)  Provider:   Code Status:  Goals of Care:  Advanced Directives 06/14/2018  Does Patient Have a Medical Advance Directive? Yes  Type of Advance Directive [place]  Does patient want to make changes to medical advance directive? No - Patient declined  Copy of [place] in Chart? -  Would patient like information on creating a medical advance directive? -     Chief Complaint  Patient presents with  . Medical Management of Chronic Issues    3 month follow up, rash on arm that itches   . Health Maintenance    due for eye exam , and tetanus vaccine     HPI: [name] a 85 y.o. female seen today for an acute visit for Follow up of her BS Patient has h/o Diabetes, Hypertension, Hypothyroidism, Hyperlipidemia  Patient's A 1 C had gone up recently so we wanted her to follow with [place] with repeat A1C in 3 months. Her repeat A1C is unchanged at 7.1 Patient was little upset today due to Covid Virus restrictions. She says her daughter can't visit her and she is unable to do her exercise and activities.  Patient does repeat herself and since her daughter is not with her seems little anxious. She said she took Restoril 1 time this week as she cant sleep well due to anxiety. She continues to watch her diet and is walking around in the [place]. Continues to watch her diet She also had small rash in her right arm. She says she does not remember Bug Bite. She was itching so had been using Cortisone cream Patient lives in independent [place]. Was Still driving. Does not use any Cane. Was Exercising  regularly.   Past Medical History:  Diagnosis Date  . Hearing loss   . History of fracture of clavicle   . Hypothyroidism   . Insomnia   . Tinnitus     Past Surgical History:  Procedure Laterality Date  . CHOLECYSTECTOMY  2007   Dr. Verdene Lennert  . CLAVICLE EXCISION  1986  . MOHS  SURGERY     past 10 years chest & legs    Allergies  Allergen Reactions  . Metformin And Related Diarrhea    Outpatient Encounter Medications as of 06/14/2018  Medication Sig  . atorvastatin (LIPITOR) 10 MG tablet Take 0.5 tablets (5 mg total) by mouth daily.  Marland Kitchen levothyroxine (SYNTHROID, LEVOTHROID) 75 MCG tablet Take 1 tablet (75 mcg total) by mouth daily before breakfast.  . Omega-3 1400 MG CAPS Take by mouth. Take one tablet 5  times a week  . temazepam (RESTORIL) 15 MG capsule Take one capsule by mouth nightly as needed for sleep  . calcium carbonate (OS-CAL) 600 MG tablet Take 1 tablet (600 mg total) by mouth 2 (two) times daily with a meal. (Patient not taking: Reported on 11/09/2017)  . Cholecalciferol (VITAMIN D3) 2000 units TABS Take by mouth. Take one Vitamin D daily  . [DISCONTINUED] MULTIPLE VITAMIN PO Take by mouth. Take one vitamin daily   No facility-administered encounter medications on file as of 06/14/2018.     Review of Systems:  Review of Systems  Review of Systems  Constitutional: Negative for activity change, appetite change, chills, diaphoresis, fatigue and fever.  HENT: Negative for mouth sores, postnasal drip, rhinorrhea, sinus pain and sore throat.   Respiratory: Negative for apnea, cough, chest tightness, shortness of breath  and wheezing.   Cardiovascular: Negative for chest pain, palpitations and leg swelling.  Gastrointestinal: Negative for abdominal distention, abdominal pain, constipation, diarrhea, nausea and vomiting.  Genitourinary: Negative for dysuria and frequency.  Musculoskeletal: Negative for arthralgias, joint swelling and myalgias.  Skin: Negative for rash.  Neurological: Negative for dizziness, syncope, weakness, light-headedness and numbness.  Psychiatric/Behavioral: Negative for behavioral problems, confusion and sleep disturbance.      Health Maintenance  Topic Date Due  . FOOT EXAM  08/19/1938  . OPHTHALMOLOGY EXAM  08/19/1938  .  TETANUS/TDAP  11/24/2026 (Originally 08/19/1947)  . INFLUENZA VACCINE  09/30/2018  . URINE MICROALBUMIN  11/24/2018  . HEMOGLOBIN A1C  12/12/2018  . DEXA SCAN  Completed  . PNA vac Low Risk Adult  Completed    Physical Exam: Vitals:   06/14/18 1325  BP: 132/84  Pulse: 88  Temp: 97.8 F (36.6 C)  SpO2: 96%  Weight: 158 lb 12.8 oz (72 kg)  Height: 5' (1.524 m)   Body mass index is 31.01 kg/m. Physical Exam  Constitutional: Oriented to person, place, and time. Well-developed and well-nourished.  HENT:  Head: Normocephalic.  Mouth/Throat: Oropharynx is clear and moist.  Eyes: Pupils are equal, round, and reactive to light.  Neck: Neck supple.  Cardiovascular: Normal rate and normal heart sounds.  No murmur heard. Pulmonary/Chest: Effort normal and breath sounds normal. No respiratory distress. No wheezes. She has no rales.  Abdominal: Soft. Bowel sounds are normal. No distension. There is no tenderness. There is no rebound.  Musculoskeletal: No edema.  Lymphadenopathy: none Neurological: Alert and oriented to person, place, and time.  Skin: Skin is warm and dry. Has some redness in her Right Arm Psychiatric: Slight Anxious and upset  Labs reviewed: Basic Metabolic Panel: Recent Labs    08/08/17 0810 11/23/17 0000 03/06/18 0810 06/12/18 0000  NA 139 141 140 140  K 4.4 4.6 4.4 4.2  CL 103 104 105 104  CO2 27 27 28 29   GLUCOSE 132* 135* 135* 160*  BUN 14 16 13 14   CREATININE 0.66 0.70 0.70 0.72  CALCIUM 9.0 9.6 8.9 8.9  TSH 2.12 1.59 2.88  --    Liver Function Tests: Recent Labs    11/23/17 0000 03/06/18 0810 06/12/18 0000  AST 17 13 23   ALT 14 12 21   BILITOT 0.3 0.3 0.4  PROT 6.5 6.4 6.6   No results for input(s): LIPASE, AMYLASE in the last 8760 hours. No results for input(s): AMMONIA in the last 8760 hours. CBC: Recent Labs    03/06/18 0810 06/12/18 0000  WBC 9.7 10.2  NEUTROABS 5,956 6,446  HGB 12.7 13.3  HCT 39.1 40.3  MCV 77.7* 80.1  PLT  323 295   Lipid Panel: Recent Labs    08/08/17 0810 11/23/17 0000 03/06/18 0810  CHOL 172 113 135  HDL 37* 34* 33*  LDLCALC 109* 58 77  TRIG 144 133 151*  CHOLHDL 4.6 3.3 4.1   Lab Results  Component Value Date   HGBA1C 7.1 (H) 06/12/2018    Procedures since last visit: No results found.  Assessment/Plan Diabetes mellitus type 2  A1C Stable Her Fasting sugar was 165 but it can be that she is not exercising due to cancel activities. Will continue to do her walks and watch her diet Will see her in 4 month  repeat A1C   Hypothyroidism TSH was normal in 01/20  Repeat TSH in next visit Mixed hyperlipidemia LDL < 100 Continue Lipitor  Primary insomnia Continue  PRN Restoril Osteopenia DEXA scan in 01/20 Continue on Calcium and Vit D Hearing Loss Wears hearing aid Covid Prophlaxis Discussed with her to take precautions and continue restriction with  visitors D/W her about her fear of the virus and Prevention  She will need detail MMSE in next appointment tetanus vaccine and Eye appointment   Labs/tests ordered:  @ORDERS @ Next appt:  10/12/2018

## 2018-08-29 ENCOUNTER — Other Ambulatory Visit: Payer: Self-pay | Admitting: Internal Medicine

## 2018-09-13 ENCOUNTER — Non-Acute Institutional Stay: Payer: Medicare Other | Admitting: Internal Medicine

## 2018-09-13 ENCOUNTER — Encounter: Payer: Self-pay | Admitting: Internal Medicine

## 2018-09-13 ENCOUNTER — Other Ambulatory Visit: Payer: Self-pay

## 2018-09-13 VITALS — BP 132/74 | HR 95 | Temp 97.9°F | Ht 60.0 in | Wt 158.2 lb

## 2018-09-13 DIAGNOSIS — W57XXXA Bitten or stung by nonvenomous insect and other nonvenomous arthropods, initial encounter: Secondary | ICD-10-CM | POA: Diagnosis not present

## 2018-09-13 DIAGNOSIS — S40862A Insect bite (nonvenomous) of left upper arm, initial encounter: Secondary | ICD-10-CM | POA: Diagnosis not present

## 2018-09-13 DIAGNOSIS — S40861A Insect bite (nonvenomous) of right upper arm, initial encounter: Secondary | ICD-10-CM

## 2018-09-13 MED ORDER — HYDROCORTISONE 1 % EX CREA: TOPICAL_CREAM | Freq: Two times a day (BID) | CUTANEOUS | Status: DC

## 2018-09-13 MED ORDER — TRIAMCINOLONE ACETONIDE 0.1 % EX CREA: 1.0000 "application " | TOPICAL_CREAM | Freq: Two times a day (BID) | CUTANEOUS | 0 refills | Status: DC

## 2018-09-13 NOTE — Progress Notes (Signed)
Location: [facility] of Service:  Clinic (12)  Provider:   Code Status: Goals of Care:  Advanced Directives 09/13/2018  Does Patient Have a Medical Advance Directive? No  Type of Advance Directive -  Does patient want to make changes to medical advance directive? -  Copy of [place] in Chart? -  Would patient like information on creating a medical advance directive? No - Patient declined     Chief Complaint  Patient presents with  . Acute Visit    rash on arms red and  itching symptoms started 7/14, has been usinh aspercreame  and it has not helped     HPI: [name] a 85 y.o. female seen today for an acute visit for Rash in her Arms which are itching Patient has h/o Diabetes, Hypertension, Hypothyroidism, Hyperlipidemia She has some macular areas few in each arm only. None in legs face or any other part of Body. They have been itching. She has been walking outside recently and it has been worse since then Continues to be Anxious due to Covid restrictions and Not able to go out with her Daughter   Past Medical History:  Diagnosis Date  . Hearing loss   . History of fracture of clavicle   . Hypothyroidism   . Insomnia   . Tinnitus     Past Surgical History:  Procedure Laterality Date  . CHOLECYSTECTOMY  2007   Dr. Verdene Lennert  . CLAVICLE EXCISION  1986  . MOHS SURGERY     past 10 years chest & legs    Allergies  Allergen Reactions  . Metformin And Related Diarrhea    Outpatient Encounter Medications as of 09/13/2018  Medication Sig  . atorvastatin (LIPITOR) 10 MG tablet Take 5 mg by mouth by mouth dailey  . levothyroxine (SYNTHROID, LEVOTHROID) 75 MCG tablet Take 1 tablet (75 mcg total) by mouth daily before breakfast.  . temazepam (RESTORIL) 15 MG capsule Take one capsule by mouth nightly as needed for sleep  . calcium carbonate (OS-CAL) 600 MG tablet Take 1 tablet (600 mg total) by mouth 2 (two) times daily with a meal.  (Patient not taking: Reported on 11/09/2017)  . Cholecalciferol (VITAMIN D3) 2000 units TABS Take by mouth. Take one Vitamin D daily  . Omega-3 1400 MG CAPS Take by mouth. Take one tablet 5  times a week  . triamcinolone cream (KENALOG) 0.1 % Apply 1 application topically 2 (two) times daily.  . [DISCONTINUED] hydrocortisone cream 1 %    No facility-administered encounter medications on file as of 09/13/2018.     Review of Systems:  Review of Systems  Constitutional: Negative.   HENT: Negative.   Respiratory: Negative.   Cardiovascular: Negative.   Gastrointestinal: Negative.   Musculoskeletal: Negative.   Skin: Positive for rash.  Psychiatric/Behavioral: Positive for confusion.    Health Maintenance  Topic Date Due  . FOOT EXAM  08/19/1938  . OPHTHALMOLOGY EXAM  08[birth year]19/1938  . TETANUS/TDAP  11/24/2026 (Originally 08/19/1947)  . INFLUENZA VACCINE  09/30/2018  . URINE MICROALBUMIN  11/24/2018  . HEMOGLOBIN A1C  12/12/2018  . DEXA SCAN  Completed  . PNA vac Low Risk Adult  Completed    Physical Exam: Vitals:   09/13/18 1543  BP: 132/74  Pulse: 95  Temp: 97.9 F (36.6 C)  TempSrc: Oral  SpO2: 99%  Weight: 158 lb 3.2 oz (71.8 kg)  Height: 5' (1.524 m)   Body mass index is 30.9  kg/m. Physical Exam Vitals signs reviewed.  HENT:     Head: Normocephalic.     Mouth/Throat:     Mouth: Mucous membranes are moist.     Pharynx: Oropharynx is clear.  Eyes:     Pupils: Pupils are equal, round, and reactive to light.  Neck:     Musculoskeletal: Neck supple.  Cardiovascular:     Rate and Rhythm: Regular rhythm.     Pulses: Normal pulses.  Pulmonary:     Effort: Pulmonary effort is normal.     Breath sounds: Normal breath sounds.  Abdominal:     General: Abdomen is flat. Bowel sounds are normal.     Palpations: Abdomen is soft.  Skin:    Comments: Has macular rash in both arms few spots scattered, No rash anywhere else  Neurological:     General: No focal deficit  present.     Mental Status: She is alert.  Psychiatric:        Mood and Affect: Mood normal.     Labs reviewed: Basic Metabolic Panel: Recent Labs    11/23/17 0000 03/06/18 0810 06/12/18 0000  NA 141 140 140  K 4.6 4.4 4.2  CL 104 105 104  CO2 27 28 29   GLUCOSE 135* 135* 160*  BUN 16 13 14   CREATININE 0.70 0.70 0.72  CALCIUM 9.6 8.9 8.9  TSH 1.59 2.88  --    Liver Function Tests: Recent Labs    11/23/17 0000 03/06/18 0810 06/12/18 0000  AST 17 13 23   ALT 14 12 21   BILITOT 0.3 0.3 0.4  PROT 6.5 6.4 6.6   No results for input(s): LIPASE, AMYLASE in the last 8760 hours. No results for input(s): AMMONIA in the last 8760 hours. CBC: Recent Labs    03/06/18 0810 06/12/18 0000  WBC 9.7 10.2  NEUTROABS 5,956 6,446  HGB 12.7 13.3  HCT 39.1 40.3  MCV 77.7* 80.1  PLT 323 295   Lipid Panel: Recent Labs    11/23/17 0000 03/06/18 0810  CHOL 113 135  HDL 34* 33*  LDLCALC 58 77  TRIG 133 151*  CHOLHDL 3.3 4.1   Lab Results  Component Value Date   HGBA1C 7.1 (H) 06/12/2018    Procedures since last visit: No results found.  Assessment/Plan 1. Mosquito bite, initial encounter Will start Triamcinolone cream for her BID for 7 days If not better come for follow up with Me in Clinic   Patient has follow up with me in clinic in few weeks for her chronic Illness Labs/tests ordered:   Next appt:  10/12/2018

## 2018-09-22 ENCOUNTER — Encounter: Payer: Self-pay | Admitting: Internal Medicine

## 2018-10-04 ENCOUNTER — Other Ambulatory Visit: Payer: Self-pay

## 2018-10-04 ENCOUNTER — Encounter: Payer: Self-pay | Admitting: Internal Medicine

## 2018-10-04 ENCOUNTER — Encounter: Payer: Medicare Other | Admitting: Internal Medicine

## 2018-10-04 ENCOUNTER — Non-Acute Institutional Stay: Payer: Medicare Other | Admitting: Internal Medicine

## 2018-10-04 VITALS — BP 114/78 | HR 90 | Temp 98.0°F | Ht 60.0 in | Wt 160.6 lb

## 2018-10-04 DIAGNOSIS — G3184 Mild cognitive impairment, so stated: Secondary | ICD-10-CM

## 2018-10-04 DIAGNOSIS — E559 Vitamin D deficiency, unspecified: Secondary | ICD-10-CM | POA: Diagnosis not present

## 2018-10-04 DIAGNOSIS — E1169 Type 2 diabetes mellitus with other specified complication: Secondary | ICD-10-CM

## 2018-10-04 DIAGNOSIS — E669 Obesity, unspecified: Secondary | ICD-10-CM | POA: Diagnosis not present

## 2018-10-04 DIAGNOSIS — E039 Hypothyroidism, unspecified: Secondary | ICD-10-CM

## 2018-10-04 DIAGNOSIS — M8580 Other specified disorders of bone density and structure, unspecified site: Secondary | ICD-10-CM

## 2018-10-04 DIAGNOSIS — E782 Mixed hyperlipidemia: Secondary | ICD-10-CM | POA: Diagnosis not present

## 2018-10-04 DIAGNOSIS — Z23 Encounter for immunization: Secondary | ICD-10-CM

## 2018-10-04 MED ORDER — TETANUS-DIPHTH-ACELL PERTUSSIS 5-2.5-18.5 LF-MCG/0.5 IM SUSP: 0.5000 mL | Freq: Once | INTRAMUSCULAR | 0 refills | Status: AC

## 2018-10-04 NOTE — Progress Notes (Signed)
Location: [place] of Service:  Clinic (12)  Provider:   Code Status:  Goals of Care:  Advanced Directives 09/13/2018  Does Patient Have a Medical Advance Directive? No  Type of Advance Directive -  Does patient want to make changes to medical advance directive? -  Copy of [place] in Chart? -  Would patient like information on creating a medical advance directive? No - Patient declined     Chief Complaint  Patient presents with  . Medical Management of Chronic Issues    follow up visit, mmse    HPI: [name] a 85 y.o. female seen today for an acute visit for Follow up of her rash and Routine visit with some concern for her Memory. Patient has h/o Diabetes, Hypertension, Hypothyroidism, Hyperlipidemia  Patient had seen me a few weeks ago with a rash which look more like a mosquito bite and I had prescribed her triamcinolone cream.  Patient said that her rash is much better and almost completely resolved.  But she was upset that she does not think it was mosquito bites. Patient's daughter also wanted me to talk to her on the phone during the visit and she was not allowed to come with her mom. Her daughter said that patient is having episodes in which she gets very confused and forgets things.  She was driving to meet her friends and lost her way. Patient got very upset with her daughter because she states she is not having any problems with her memory     Past Medical History:  Diagnosis Date  . Hearing loss   . History of fracture of clavicle   . Hypothyroidism   . Insomnia   . Tinnitus     Past Surgical History:  Procedure Laterality Date  . CHOLECYSTECTOMY  2007   Dr. Verdene Lennert  . CLAVICLE EXCISION  1986  . MOHS SURGERY     past 10 years chest & legs    Allergies  Allergen Reactions  . Metformin And Related Diarrhea    Outpatient Encounter Medications as of 10/04/2018  Medication Sig  . atorvastatin (LIPITOR) 10 MG  tablet Take 5 mg by mouth by mouth dailey  . levothyroxine (SYNTHROID, LEVOTHROID) 75 MCG tablet Take 1 tablet (75 mcg total) by mouth daily before breakfast.  . temazepam (RESTORIL) 15 MG capsule Take one capsule by mouth nightly as needed for sleep  . triamcinolone cream (KENALOG) 0.1 % Apply 1 application topically 2 (two) times daily.  . calcium carbonate (OS-CAL) 600 MG tablet Take 1 tablet (600 mg total) by mouth 2 (two) times daily with a meal. (Patient not taking: Reported on 11/09/2017)  . Cholecalciferol (VITAMIN D3) 2000 units TABS Take by mouth. Take one Vitamin D daily  . Omega-3 1400 MG CAPS Take by mouth. Take one tablet 5  times a week   No facility-administered encounter medications on file as of 10/04/2018.     Review of Systems:  Review of Systems  Review of Systems  Constitutional: Negative for activity change, appetite change, chills, diaphoresis, fatigue and fever.  HENT: Negative for mouth sores, postnasal drip, rhinorrhea, sinus pain and sore throat.   Respiratory: Negative for apnea, cough, chest tightness, shortness of breath and wheezing.   Cardiovascular: Negative for chest pain, palpitations and leg swelling.  Gastrointestinal: Negative for abdominal distention, abdominal pain, constipation, diarrhea, nausea and vomiting.  Genitourinary: Negative for dysuria and frequency.  Musculoskeletal: Negative for arthralgias, joint swelling  and myalgias.  Skin: Negative for rash.  Neurological: Negative for dizziness, syncope, weakness, light-headedness and numbness.  Psychiatric/Behavioral: Negative for behavioral problems and sleep disturbance.     Health Maintenance  Topic Date Due  . FOOT EXAM  08/19/1938  . OPHTHALMOLOGY EXAM  08/19/1938  . INFLUENZA VACCINE  09/30/2018  . URINE MICROALBUMIN  11/24/2018  . TETANUS/TDAP  11/24/2026 (Originally 08/19/1947)  . HEMOGLOBIN A1C  12/12/2018  . DEXA SCAN  Completed  . PNA vac Low Risk Adult  Completed    Physical  Exam: Vitals:   10/04/18 1547  BP: 114/78  Pulse: 90  Temp: 98 F (36.7 C)  SpO2: 96%   There is no height or weight on file to calculate BMI. Physical Exam Constitutional:. Well-developed and well-nourished.  HENT:  Head: Normocephalic.  Mouth/Throat: Oropharynx is clear and moist.  Eyes: Pupils are equal, round, and reactive to light.  Neck: Neck supple.  Cardiovascular: Normal rate and normal heart sounds.  No murmur heard. Pulmonary/Chest: Effort normal and breath sounds normal. No respiratory distress. No wheezes. She has no rales.  Abdominal: Soft. Bowel sounds are normal. No distension. There is no tenderness. There is no rebound.  Musculoskeletal: No edema.  Lymphadenopathy: none Neurological: . Gait is Normal with no Cane or walker Skin: Skin is warm and dry.  Psychiatric: Normal mood and affect. Behavior is normal. Thought content normal.   Labs reviewed: Basic Metabolic Panel: Recent Labs    11/23/17 0000 03/06/18 0810 06/12/18 0000  NA 141 140 140  K 4.6 4.4 4.2  CL 104 105 104  CO2 27 28 29   GLUCOSE 135* 135* 160*  BUN 16 13 14   CREATININE 0.70 0.70 0.72  CALCIUM 9.6 8.9 8.9  TSH 1.59 2.88  --    Liver Function Tests: Recent Labs    11/23/17 0000 03/06/18 0810 06/12/18 0000  AST 17 13 23   ALT 14 12 21   BILITOT 0.3 0.3 0.4  PROT 6.5 6.4 6.6   No results for input(s): LIPASE, AMYLASE in the last 8760 hours. No results for input(s): AMMONIA in the last 8760 hours. CBC: Recent Labs    03/06/18 0810 06/12/18 0000  WBC 9.7 10.2  NEUTROABS 5,956 6,446  HGB 12.7 13.3  HCT 39.1 40.3  MCV 77.7* 80.1  PLT 323 295   Lipid Panel: Recent Labs    11/23/17 0000 03/06/18 0810  CHOL 113 135  HDL 34* 33*  LDLCALC 58 77  TRIG 133 151*  CHOLHDL 3.3 4.1   Lab Results  Component Value Date   HGBA1C 7.1 (H) 06/12/2018    Procedures since last visit: No results found.  Assessment/Plan Rash Resolved with Triamcinolone If recurs she wants  to see dermatology Cognitive impairment Her MMSE was 25/30  She got confused month and thought it was July She failed her clock drawing and copying  the picture. I discussed all this with her daughter and we agreed that patient needs speech therapy referral.  At this time the daughter is not interested in her mom to go see neurology.  We will discussed with the daughter after the speech recommendations Diabetes mellitus type 2  Repeat A1c pending [name] diet controlled   Hypothyroidism TSH was normal in 01/20 Repeat TSH is pending  Mixed hyperlipidemia LDL < 100 Continue Lipitor Fasting lipid Primary insomnia Continue PRN Restoril Patient said that she has been using Restoril more often due to Covid with she is little more anxious Osteopenia DEXA scan in 01/20  Continue on Calcium and Vit D Hearing Loss Wears hearing aid  Labs/tests ordered:   Next appt:  10/12/2018

## 2018-10-05 ENCOUNTER — Other Ambulatory Visit: Payer: Self-pay | Admitting: *Deleted

## 2018-10-05 DIAGNOSIS — F5101 Primary insomnia: Secondary | ICD-10-CM

## 2018-10-05 MED ORDER — TEMAZEPAM 15 MG PO CAPS: ORAL_CAPSULE | ORAL | 0 refills | Status: DC

## 2018-10-05 MED ORDER — ATORVASTATIN CALCIUM 10 MG PO TABS: ORAL_TABLET | ORAL | 1 refills | Status: DC

## 2018-10-05 NOTE — Telephone Encounter (Signed)
Patient called and stated that she needed refills.  Pended Rx and sent to Dr. Lyndel Safe for approval.

## 2018-10-09 ENCOUNTER — Other Ambulatory Visit: Payer: Self-pay

## 2018-10-09 ENCOUNTER — Ambulatory Visit (INDEPENDENT_AMBULATORY_CARE_PROVIDER_SITE_OTHER): Payer: Medicare Other | Admitting: Family

## 2018-10-09 ENCOUNTER — Encounter: Payer: Self-pay | Admitting: Family

## 2018-10-09 VITALS — BP 124/76 | HR 86 | Temp 97.5°F | Ht 60.0 in | Wt 159.6 lb

## 2018-10-09 DIAGNOSIS — L853 Xerosis cutis: Secondary | ICD-10-CM | POA: Diagnosis not present

## 2018-10-09 DIAGNOSIS — L509 Urticaria, unspecified: Secondary | ICD-10-CM | POA: Diagnosis not present

## 2018-10-09 MED ORDER — CETIRIZINE HCL 10 MG PO TABS: 10.0000 mg | ORAL_TABLET | Freq: Every day | ORAL | 11 refills | Status: DC

## 2018-10-09 MED ORDER — TRIAMCINOLONE ACETONIDE 0.1 % EX CREA: 1.0000 "application " | TOPICAL_CREAM | Freq: Two times a day (BID) | CUTANEOUS | 0 refills | Status: DC

## 2018-10-09 NOTE — Progress Notes (Signed)
Provider: Ahmira Boisselle FNP-C  Lisa Dad, MD  Patient Care Team: Lisa Dad, MD as PCP - General (Internal Medicine)  Extended Emergency Contact Information Primary Emergency Contact: Lisa Castillo,Lisa Castillo Address: [address]          [place], [place] [address] Lisa Castillo of [place] Phone: [phone] Mobile Phone: [phone] Relation: Daughter  Code Status:  Goals of care: Advanced Directive information Advanced Directives 09/13/2018  Does Patient Have a Medical Advance Directive? No  Type of Advance Directive -  Does patient want to make changes to medical advance directive? -  Copy of [place] in Chart? -  Would patient like information on creating a medical advance directive? No - Patient declined     Chief Complaint  Patient presents with  . Acute Visit    Itchy rash all over patient states more than 2 weeks ago for itching places on legs, hands, and arms.   . Medication Management    Kenolog cream - patient states medication never helped with rash     HPI:  Pt is a 85 y.o. female seen today for an acute visit for evaluation of Itchy rash all over for more than 2 weeks.she is here with her daughter.she states itching on legs, hands, and arms.she was recently seen by Dr.Gupta and Kenolog cream was prescribed.she states medication never helped with rash.she denies any insect bite.she states no change in soap,detergent or lotion.No new medication.she does not recall eating anything unsual.she resides in [place] where meals are provided.she denies any fever,chills or cough.Daughter states would like referral to dermatologist Camillo Flaming at telephone [phone].     Past Medical History:  Diagnosis Date  . Hearing loss   . History of fracture of clavicle   . Hypothyroidism   . Insomnia   . Tinnitus    Past Surgical History:  Procedure Laterality Date  . CHOLECYSTECTOMY  2007   Dr. Verdene Lennert  . CLAVICLE EXCISION   1986  . MOHS SURGERY     past 10 years chest & legs    Allergies  Allergen Reactions  . Metformin And Related Diarrhea    Outpatient Encounter Medications as of 10/09/2018  Medication Sig  . atorvastatin (LIPITOR) 10 MG tablet Take 5 mg by mouth by mouth dailey  . calcium carbonate (OS-CAL) 600 MG tablet Take 1 tablet (600 mg total) by mouth 2 (two) times daily with a meal.  . Cholecalciferol (VITAMIN D3) 2000 units TABS Take by mouth. Take one Vitamin D daily  . levothyroxine (SYNTHROID, LEVOTHROID) 75 MCG tablet Take 1 tablet (75 mcg total) by mouth daily before breakfast.  . Omega-3 1400 MG CAPS Take by mouth. Take one tablet 5  times a week  . temazepam (RESTORIL) 15 MG capsule Take one capsule by mouth nightly as needed for sleep  . triamcinolone cream (KENALOG) 0.1 % Apply 1 application topically 2 (two) times daily.   No facility-administered encounter medications on file as of 10/09/2018.     Review of Systems  Constitutional: Negative for appetite change, chills, fatigue and fever.  HENT: Positive for hearing loss. Negative for congestion, rhinorrhea, sinus pressure, sinus pain, sneezing and sore throat.   Eyes: Negative for discharge, redness and itching.  Respiratory: Negative for cough, chest tightness, shortness of breath and wheezing.   Cardiovascular: Negative for chest pain, palpitations and leg swelling.  Gastrointestinal: Negative for abdominal distention, abdominal pain, constipation, diarrhea, nausea and vomiting.  Musculoskeletal: Positive for gait problem.  Negative for myalgias.  Skin: Positive for rash. Negative for color change and pallor.       Itchy Rash on arms,hands,legs and back   Neurological: Negative for dizziness, weakness, light-headedness, numbness and headaches.  Psychiatric/Behavioral: Negative for agitation. The patient is not nervous/anxious.        Unable to sleep at night due to itching     Immunization History  Administered Date(s)  Administered  . DTaP 07/20/2013  . Influenza, High Dose Seasonal PF 12/07/2016  . Influenza,inj,Quad PF,6+ Mos 12/01/2017  . Influenza-Unspecified 11/20/2014, 12/12/2015  . PPD Test 08/06/2014  . Pneumococcal Conjugate-13 11/07/2013  . Pneumococcal Polysaccharide-23 03/22/2017  . Zoster 03/01/2006  . Zoster Recombinat (Shingrix) 06/07/2017, 08/26/2017   Pertinent  Health Maintenance Due  Topic Date Due  . FOOT EXAM  08/19/1938  . OPHTHALMOLOGY EXAM  [birth year]19/1938  . INFLUENZA VACCINE  09/30/2018  . URINE MICROALBUMIN  11/24/2018  . HEMOGLOBIN A1C  12/12/2018  . DEXA SCAN  Completed  . PNA vac Low Risk Adult  Completed   Fall Risk  10/09/2018 09/13/2018 06/14/2018 03/15/2018 11/29/2017  Falls in the past year? 0 0 0 0 No  Number falls in past yr: 0 0 0 0 -  Injury with Fall? 0 0 0 0 -    Vitals:   10/09/18 1352  BP: 124/76  Pulse: 86  Temp: (!) 97.5 F (36.4 C)  TempSrc: Oral  SpO2: 98%  Weight: 159 lb 9.6 oz (72.4 kg)  Height: 5' (1.524 m)   Body mass index is 31.17 kg/m. Physical Exam Vitals signs reviewed.  Constitutional:      General: She is not in acute distress.    Appearance: She is obese. She is not ill-appearing.  HENT:     Mouth/Throat:     Mouth: Mucous membranes are moist.     Pharynx: Oropharynx is clear. No oropharyngeal exudate or posterior oropharyngeal erythema.  Eyes:     General: No scleral icterus.       Right eye: No discharge.        Left eye: No discharge.     Conjunctiva/sclera: Conjunctivae normal.     Pupils: Pupils are equal, round, and reactive to light.  Cardiovascular:     Rate and Rhythm: Normal rate and regular rhythm.     Pulses: Normal pulses.     Heart sounds: Normal heart sounds. No murmur. No friction rub. No gallop.   Pulmonary:     Effort: Pulmonary effort is normal. No respiratory distress.     Breath sounds: Normal breath sounds. No wheezing, rhonchi or rales.  Chest:     Chest wall: No tenderness.  Abdominal:      General: Bowel sounds are normal. There is no distension.     Palpations: Abdomen is soft. There is no mass.     Tenderness: There is no abdominal tenderness. There is no right CVA tenderness, left CVA tenderness, guarding or rebound.  Musculoskeletal:        General: No swelling or tenderness.     Right lower leg: No edema.     Left lower leg: No edema.     Comments: Unsteady wide gait.  Skin:    General: Skin is warm and dry.     Coloration: Skin is not pale.     Findings: Rash present. No erythema. Rash is macular. Rash is not crusting, nodular, papular, purpuric or vesicular.          Comments: Red non-raised blanchable skin  areas on hands,forearms,legs and upper back.Generalized dry skin noted.   Neurological:     Mental Status: She is alert. Mental status is at baseline.     Cranial Nerves: No cranial nerve deficit.     Sensory: No sensory deficit.     Motor: No weakness.  Psychiatric:        Mood and Affect: Mood normal.        Behavior: Behavior normal.        Thought Content: Thought content normal.        Judgment: Judgment normal.    Labs reviewed: Recent Labs    11/23/17 0000 03/06/18 0810 06/12/18 0000  NA 141 140 140  K 4.6 4.4 4.2  CL 104 105 104  CO2 27 28 29   GLUCOSE 135* 135* 160*  BUN 16 13 14   CREATININE 0.70 0.70 0.72  CALCIUM 9.6 8.9 8.9   Recent Labs    11/23/17 0000 03/06/18 0810 06/12/18 0000  AST 17 13 23   ALT 14 12 21   BILITOT 0.3 0.3 0.4  PROT 6.5 6.4 6.6   Recent Labs    03/06/18 0810 06/12/18 0000  WBC 9.7 10.2  NEUTROABS 5,956 6,446  HGB 12.7 13.3  HCT 39.1 40.3  MCV 77.7* 80.1  PLT 323 295   Lab Results  Component Value Date   TSH 2.88 03/06/2018   Lab Results  Component Value Date   HGBA1C 7.1 (H) 06/12/2018   Lab Results  Component Value Date   CHOL 135 03/06/2018   HDL 33 (L) 03/06/2018   LDLCALC 77 03/06/2018   TRIG 151 (H) 03/06/2018   CHOLHDL 4.1 03/06/2018    Significant Diagnostic Results in last  30 days:  No results found.  Assessment/Plan 1. Urticaria afebrile.Red non-raised blanchable skin areas on hands,forearms,legs and upper back.Generalized dry skin noted.Start on over the counter Cetrizine 10 mg tablet one by mouth daily x 14 days for itching.may continue with kenalog cream as directed by MD. Encouraged to wash bed linen and clothes with warm water and soap.    - Ambulatory referral to Dermatology [facility]  At Telephone [phone] per POA request.   2. Dry skin Encouraged to Increase fluid intake to prevent dry skin.Apply lotion to prevent dry skin.   Family/ staff Communication: Reviewed plan of care with patient and daughter.  Labs/tests ordered: None   Lisa Delpilar C Tarita Deshmukh, NP

## 2018-10-09 NOTE — Patient Instructions (Signed)
1. Take cetrizine 10 mg tablet one by mouth daily x 14 days  2. Increase fluid intake to prevent dry skin  3. Apply lotion to prevent dry skin.

## 2018-10-11 ENCOUNTER — Encounter: Payer: Medicare Other | Admitting: Internal Medicine

## 2018-10-11 ENCOUNTER — Telehealth: Payer: Self-pay | Admitting: Internal Medicine

## 2018-10-11 NOTE — Telephone Encounter (Signed)
Patient missed her lab appointment today.  I was going to reschedule labs for tomorrow, but patient was asking when she was to see Dr. Lyndel Safe.  Patient was advised that she saw Dr. Lyndel Safe on 10/04/18 and no other appointments were scheduled. Please call patient to set up lab appointment and also to make appointment with Dr. Lyndel Safe.

## 2018-10-11 NOTE — Telephone Encounter (Signed)
Called patient and confirmed her lab appointment for tomorrow, [name] also aware of the location and that someone will call her once the results are available.

## 2018-10-12 ENCOUNTER — Other Ambulatory Visit: Payer: Self-pay

## 2018-10-12 ENCOUNTER — Other Ambulatory Visit: Payer: Medicare Other

## 2018-10-12 DIAGNOSIS — E038 Other specified hypothyroidism: Secondary | ICD-10-CM | POA: Diagnosis not present

## 2018-10-12 DIAGNOSIS — E559 Vitamin D deficiency, unspecified: Secondary | ICD-10-CM | POA: Diagnosis not present

## 2018-10-12 DIAGNOSIS — E1169 Type 2 diabetes mellitus with other specified complication: Secondary | ICD-10-CM | POA: Diagnosis not present

## 2018-10-12 DIAGNOSIS — E669 Obesity, unspecified: Secondary | ICD-10-CM

## 2018-10-12 DIAGNOSIS — E782 Mixed hyperlipidemia: Secondary | ICD-10-CM

## 2018-10-13 LAB — CBC WITH DIFFERENTIAL/PLATELET
Absolute Monocytes: 806 cells/uL (ref 200–950)
Basophils Absolute: 71 cells/uL (ref 0–200)
Basophils Relative: 0.7 %
Eosinophils Absolute: 306 cells/uL (ref 15–500)
Eosinophils Relative: 3 %
HCT: 42.1 % (ref 35.0–45.0)
Hemoglobin: 13.7 g/dL (ref 11.7–15.5)
Lymphs Abs: 3295 cells/uL (ref 850–3900)
MCH: 26.6 pg — ABNORMAL LOW (ref 27.0–33.0)
MCHC: 32.5 g/dL (ref 32.0–36.0)
MCV: 81.6 fL (ref 80.0–100.0)
MPV: 10.4 fL (ref 7.5–12.5)
Monocytes Relative: 7.9 %
Neutro Abs: 5722 cells/uL (ref 1500–7800)
Neutrophils Relative %: 56.1 %
Platelets: 316 10*3/uL (ref 140–400)
RBC: 5.16 10*6/uL — ABNORMAL HIGH (ref 3.80–5.10)
RDW: 14.2 % (ref 11.0–15.0)
Total Lymphocyte: 32.3 %
WBC: 10.2 10*3/uL (ref 3.8–10.8)

## 2018-10-13 LAB — LIPID PANEL
Cholesterol: 136 mg/dL (ref <200)
HDL: 35 mg/dL — ABNORMAL LOW (ref >=50)
LDL Cholesterol (Calc): 72 mg/dL (calc)
Non-HDL Cholesterol (Calc): 101 mg/dL (calc) (ref <130)
Total CHOL/HDL Ratio: 3.9 (calc) (ref <5.0)
Triglycerides: 195 mg/dL — ABNORMAL HIGH (ref <150)

## 2018-10-13 LAB — COMPLETE METABOLIC PANEL WITH GFR
AG Ratio: 1.3 (calc) (ref 1.0–2.5)
ALT: 17 U/L (ref 6–29)
AST: 20 U/L (ref 10–35)
Albumin: 3.8 g/dL (ref 3.6–5.1)
Alkaline phosphatase (APISO): 131 U/L (ref 37–153)
BUN: 11 mg/dL (ref 7–25)
CO2: 22 mmol/L (ref 20–32)
Calcium: 9.2 mg/dL (ref 8.6–10.4)
Chloride: 105 mmol/L (ref 98–110)
Creat: 0.77 mg/dL (ref 0.60–0.88)
GFR, Est African American: 80 mL/min/{1.73_m2} (ref >=60)
GFR, Est Non African American: 69 mL/min/{1.73_m2} (ref >=60)
Globulin: 3 g/dL (calc) (ref 1.9–3.7)
Glucose, Bld: 140 mg/dL — ABNORMAL HIGH (ref 65–99)
Potassium: 4.6 mmol/L (ref 3.5–5.3)
Sodium: 142 mmol/L (ref 135–146)
Total Bilirubin: 0.3 mg/dL (ref 0.2–1.2)
Total Protein: 6.8 g/dL (ref 6.1–8.1)

## 2018-10-13 LAB — TSH: TSH: 2.47 mIU/L (ref 0.40–4.50)

## 2018-10-13 LAB — HEMOGLOBIN A1C
Hgb A1c MFr Bld: 7.5 % of total Hgb — ABNORMAL HIGH (ref <5.7)
Mean Plasma Glucose: 169 (calc)
eAG (mmol/L): 9.3 (calc)

## 2018-10-13 LAB — VITAMIN D 25 HYDROXY (VIT D DEFICIENCY, FRACTURES): Vit D, 25-Hydroxy: 31 ng/mL (ref 30–100)

## 2018-10-18 ENCOUNTER — Encounter: Payer: Self-pay | Admitting: Internal Medicine

## 2018-10-18 DIAGNOSIS — R41841 Cognitive communication deficit: Secondary | ICD-10-CM | POA: Diagnosis not present

## 2018-10-19 ENCOUNTER — Telehealth: Payer: Self-pay

## 2018-10-19 NOTE — Telephone Encounter (Signed)
Paper was faxed to [facility] office today from [facility], stating that patient refused referral and did not want to schedule an appointment. If you have any questions they can be reached at [phone].

## 2018-10-20 DIAGNOSIS — R41841 Cognitive communication deficit: Secondary | ICD-10-CM | POA: Diagnosis not present

## 2018-10-26 DIAGNOSIS — R41841 Cognitive communication deficit: Secondary | ICD-10-CM | POA: Diagnosis not present

## 2018-11-02 DIAGNOSIS — R41841 Cognitive communication deficit: Secondary | ICD-10-CM | POA: Diagnosis not present

## 2018-11-07 DIAGNOSIS — R41841 Cognitive communication deficit: Secondary | ICD-10-CM | POA: Diagnosis not present

## 2018-11-09 DIAGNOSIS — R41841 Cognitive communication deficit: Secondary | ICD-10-CM | POA: Diagnosis not present

## 2018-11-14 DIAGNOSIS — R41841 Cognitive communication deficit: Secondary | ICD-10-CM | POA: Diagnosis not present

## 2018-11-16 DIAGNOSIS — R41841 Cognitive communication deficit: Secondary | ICD-10-CM | POA: Diagnosis not present

## 2018-11-17 ENCOUNTER — Telehealth: Payer: Self-pay | Admitting: *Deleted

## 2018-11-17 NOTE — Telephone Encounter (Signed)
Patient daughter, Catisha Curcuru called and stated that patient is going to move in with her for a while and she is needing patient's current medication list.   Daughter is not listed as POA, Curt Bears is.   Dorian Pod stated that Joycelyn Schmid does not care for patient and is not concerned about her health.   I told her that I would need an updated POA to discuss any information. She stated that she will call [facility].

## 2018-11-22 ENCOUNTER — Non-Acute Institutional Stay: Payer: Medicare Other | Admitting: Internal Medicine

## 2018-11-22 ENCOUNTER — Encounter: Payer: Self-pay | Admitting: Internal Medicine

## 2018-11-22 VITALS — BP 140/78 | HR 100 | Temp 97.0°F | Resp 20 | Ht 60.0 in | Wt 159.4 lb

## 2018-11-22 DIAGNOSIS — E782 Mixed hyperlipidemia: Secondary | ICD-10-CM

## 2018-11-22 DIAGNOSIS — G3184 Mild cognitive impairment, so stated: Secondary | ICD-10-CM

## 2018-11-22 DIAGNOSIS — E039 Hypothyroidism, unspecified: Secondary | ICD-10-CM | POA: Diagnosis not present

## 2018-11-22 DIAGNOSIS — E669 Obesity, unspecified: Secondary | ICD-10-CM | POA: Diagnosis not present

## 2018-11-22 DIAGNOSIS — E1169 Type 2 diabetes mellitus with other specified complication: Secondary | ICD-10-CM

## 2018-11-22 DIAGNOSIS — E559 Vitamin D deficiency, unspecified: Secondary | ICD-10-CM

## 2018-11-22 NOTE — Progress Notes (Signed)
Location: [place] of Service:  Clinic (12)  Provider:   Code Status:  Goals of Care:  Advanced Directives 09/13/2018  Does Patient Have a Medical Advance Directive? No  Type of Advance Directive -  Does patient want to make changes to medical advance directive? -  Copy of [place] in Chart? -  Would patient like information on creating a medical advance directive? No - Patient declined     Chief Complaint  Patient presents with  . Medical Management of Chronic Issues    2 mo f/u - diabetes    HPI: [name] a 85 y.o. female seen today for an acute visit for Follow up for her rash and Cognitive impairment Diabetes Will continue to watch her appetite. At this time with Mrs Molski getting confused about her other Meds. I would not recommend starting her on anything. Will continue to watch her closely Cognitive Impairment\ She was working with Speech therapy  I talked to him and he told me that she resists lot of his ideas. And says she does not need any help. Patient was repeating the same thing today. Says there is nothing wrong with her Rash Resolved. ? Etiology but responded to Triamcinolone Hyperlipidemia On Lipitor Hypothyroidism Continue Supplement  Patient did have one fall when she tripped on the carpet in facility Says she did not get hurt Does not want to carry Kasandra Knudsen with her Past Medical History:  Diagnosis Date  . Hearing loss   . History of fracture of clavicle   . Hypothyroidism   . Insomnia   . Tinnitus     Past Surgical History:  Procedure Laterality Date  . CHOLECYSTECTOMY  2007   Dr. Verdene Lennert  . CLAVICLE EXCISION  1986  . MOHS SURGERY     past 10 years chest & legs    Allergies  Allergen Reactions  . Metformin And Related Diarrhea    Outpatient Encounter Medications as of 11/22/2018  Medication Sig  . atorvastatin (LIPITOR) 10 MG tablet Take 5 mg by mouth by mouth dailey  . levothyroxine (SYNTHROID,  LEVOTHROID) 75 MCG tablet Take 1 tablet (75 mcg total) by mouth daily before breakfast.  . calcium carbonate (OS-CAL) 600 MG tablet Take 1 tablet (600 mg total) by mouth 2 (two) times daily with a meal. (Patient not taking: Reported on 11/22/2018)  . cetirizine (ZYRTEC) 10 MG tablet Take 1 tablet (10 mg total) by mouth daily. (Patient not taking: Reported on 11/22/2018)  . Cholecalciferol (VITAMIN D3) 2000 units TABS Take by mouth. Take one Vitamin D daily  . Omega-3 1400 MG CAPS Take by mouth. Take one tablet 5  times a week  . temazepam (RESTORIL) 15 MG capsule Take one capsule by mouth nightly as needed for sleep (Patient not taking: Reported on 11/22/2018)  . triamcinolone cream (KENALOG) 0.1 % Apply 1 application topically 2 (two) times daily. (Patient not taking: Reported on 11/22/2018)   No facility-administered encounter medications on file as of 11/22/2018.     Review of Systems:  Review of Systems  Review of Systems  Constitutional: Negative for activity change, appetite change, chills, diaphoresis, fatigue and fever.  HENT: Negative for mouth sores, postnasal drip, rhinorrhea, sinus pain and sore throat.   Respiratory: Negative for apnea, cough, chest tightness, shortness of breath and wheezing.   Cardiovascular: Negative for chest pain, palpitations and leg swelling.  Gastrointestinal: Negative for abdominal distention, abdominal pain, constipation, diarrhea, nausea and vomiting.  Genitourinary: Negative for dysuria and frequency.  Musculoskeletal: Negative for arthralgias, joint swelling and myalgias.  Skin: Negative for rash.  Neurological: Negative for dizziness, syncope, weakness, light-headedness and numbness.  Psychiatric/Behavioral: Negative for behavioral problems, confusion and sleep disturbance.     Health Maintenance  Topic Date Due  . FOOT EXAM  08/19/1938  . OPHTHALMOLOGY EXAM  08/19/1938  . INFLUENZA VACCINE  09/30/2018  . URINE MICROALBUMIN  11/24/2018  .  TETANUS/TDAP  11/24/2026 (Originally 08/19/1947)  . HEMOGLOBIN A1C  04/14/2019  . DEXA SCAN  Completed  . PNA vac Low Risk Adult  Completed    Physical Exam: Vitals:   11/22/18 1410  BP: 140/78  Pulse: 100  Resp: 20  Temp: (!) 97 F (36.1 C)  SpO2: 100%  Weight: 159 lb 6.4 oz (72.3 kg)  Height: 5' (1.524 m)   Body mass index is 31.13 kg/m. Physical Exam Constitutional:Well-developed and well-nourished.  HENT:  Head: Normocephalic.  Mouth/Throat: Oropharynx is clear and moist.  Eyes: Pupils are equal, round, and reactive to light.  Neck: Neck supple.  Cardiovascular: Normal rate and normal heart sounds.  No murmur heard. Pulmonary/Chest: Effort normal and breath sounds normal. No respiratory distress. No wheezes. She has no rales.  Abdominal: Soft. Bowel sounds are normal. No distension. There is no tenderness. There is no rebound.  Musculoskeletal: No edema.  Lymphadenopathy: none Neurological: Gait is steady with no cane or walker Skin: Skin is warm and dry.  Psychiatric: Normal mood and affect. Behavior is normal. Thought content normal.   Labs reviewed: Basic Metabolic Panel: Recent Labs    11/23/17 0000 03/06/18 0810 06/12/18 0000 10/12/18 0000  NA 141 140 140 142  K 4.6 4.4 4.2 4.6  CL 104 105 104 105  CO2 27 28 29 22   GLUCOSE 135* 135* 160* 140*  BUN 16 13 14 11   CREATININE 0.70 0.70 0.72 0.77  CALCIUM 9.6 8.9 8.9 9.2  TSH 1.59 2.88  --  2.47   Liver Function Tests: Recent Labs    03/06/18 0810 06/12/18 0000 10/12/18 0000  AST 13 23 20   ALT 12 21 17   BILITOT 0.3 0.4 0.3  PROT 6.4 6.6 6.8   No results for input(s): LIPASE, AMYLASE in the last 8760 hours. No results for input(s): AMMONIA in the last 8760 hours. CBC: Recent Labs    03/06/18 0810 06/12/18 0000 10/12/18 0000  WBC 9.7 10.2 10.2  NEUTROABS 5,956 6,446 5,722  HGB 12.7 13.3 13.7  HCT 39.1 40.3 42.1  MCV 77.7* 80.1 81.6  PLT 323 295 316   Lipid Panel: Recent Labs     11/23/17 0000 03/06/18 0810 10/12/18 0000  CHOL 113 135 136  HDL 34* 33* 35*  LDLCALC 58 77 72  TRIG 133 151* 195*  CHOLHDL 3.3 4.1 3.9   Lab Results  Component Value Date   HGBA1C 7.5 (H) 10/12/2018    Procedures since last visit: No results found.  Assessment/Plan . Rash Resolved with Triamcinolone  Cognitive impairment Her MMSE was 25/30 on last visit She got confused month and thought it was July She failed her clock drawing and copying  the picture. She was referd to speech therapy and working with them Daughter had refused Neurology Consult for now  Diabetes mellitus type 2 Continue Diet Controlled  Hypothyroidism TSH was normal in 8/20  Mixed hyperlipidemia LDL < 100 Continue Lipitor  Primary insomnia Continue PRN Restoril  Osteopenia DEXA scan in 01/20 Continue on Calcium and Vit D Hearing  Loss Wears hearing aid  We called [facility] and patient never went for her TDAP She told me she did Will call her to remind Labs/tests ordered:  * No order type specified * Next appt:  04/25/2019  Total time spent in this patient care encounter was  25_  minutes; greater than 50% of the visit spent counseling patient and staff, reviewing records , Labs and coordinating care for problems addressed at this encounter.

## 2018-11-28 DIAGNOSIS — R41841 Cognitive communication deficit: Secondary | ICD-10-CM | POA: Diagnosis not present

## 2018-11-29 ENCOUNTER — Encounter: Payer: Self-pay | Admitting: Internal Medicine

## 2018-11-29 ENCOUNTER — Other Ambulatory Visit: Payer: Self-pay

## 2018-11-29 ENCOUNTER — Non-Acute Institutional Stay: Payer: Medicare Other | Admitting: Internal Medicine

## 2018-11-29 VITALS — BP 134/76 | HR 95 | Temp 97.3°F | Wt 158.8 lb

## 2018-11-29 DIAGNOSIS — F320 Major depressive disorder, single episode, mild: Secondary | ICD-10-CM | POA: Diagnosis not present

## 2018-11-29 DIAGNOSIS — L853 Xerosis cutis: Secondary | ICD-10-CM

## 2018-11-29 DIAGNOSIS — G3184 Mild cognitive impairment, so stated: Secondary | ICD-10-CM

## 2018-11-29 NOTE — Progress Notes (Signed)
Location: [place] of Service:  Clinic (12)  Provider:   Code Status:  Goals of Care:  Advanced Directives 09/13/2018  Does Patient Have a Medical Advance Directive? No  Type of Advance Directive -  Does patient want to make changes to medical advance directive? -  Copy of [place] in Chart? -  Would patient like information on creating a medical advance directive? No - Patient declined     Chief Complaint  Patient presents with  . Acute Visit    Itching of extremetties for a couple of months.     HPI: [name] a 85 y.o. female seen today for an acute visit for ? Rash and itching Patient was just seen 2 weeks ago for her follow-up. She had told me at that time her rash was resolved But she called the office the need appointment again.. Then she forgot about her appointment and nurses had to call her. She said that she sometimes itches in her arms at night.  I did not see any rash.  I asked her about triamcinolone cream which was prescribed to her few months ago.  She said she does not remember it that she will try to find it and use it as needed. [name] having a lot of issues with cognitive impairment. Patient therapy is working with her and she is having some issues with her medications. Patient also told me today that she feels really down as she was very actively involved in the friend's home activities  Past Medical History:  Diagnosis Date  . Hearing loss   . History of fracture of clavicle   . Hypothyroidism   . Insomnia   . Tinnitus     Past Surgical History:  Procedure Laterality Date  . CHOLECYSTECTOMY  2007   Dr. Verdene Lennert  . CLAVICLE EXCISION  1986  . MOHS SURGERY     past 10 years chest & legs    Allergies  Allergen Reactions  . Metformin And Related Diarrhea    Outpatient Encounter Medications as of 11/29/2018  Medication Sig  . atorvastatin (LIPITOR) 10 MG tablet Take 10 mg by mouth daily.  . calcium  carbonate (OS-CAL) 600 MG tablet Take 1 tablet (600 mg total) by mouth 2 (two) times daily with a meal.  . cetirizine (ZYRTEC) 10 MG tablet Take 1 tablet (10 mg total) by mouth daily.  . Cholecalciferol (VITAMIN D3) 2000 units TABS Take by mouth. Take one Vitamin D daily  . levothyroxine (SYNTHROID, LEVOTHROID) 75 MCG tablet Take 1 tablet (75 mcg total) by mouth daily before breakfast.  . Omega-3 1400 MG CAPS Take by mouth. Take one tablet 5  times a week  . temazepam (RESTORIL) 15 MG capsule Take one capsule by mouth nightly as needed for sleep  . triamcinolone cream (KENALOG) 0.1 % Apply 1 application topically 2 (two) times daily.  . [DISCONTINUED] atorvastatin (LIPITOR) 10 MG tablet Take 5 mg by mouth by mouth dailey (Patient taking differently: Take 10 mg by mouth daily. )   No facility-administered encounter medications on file as of 11/29/2018.     Review of Systems:  Review of Systems  Constitutional: Negative.   HENT: Negative.   Respiratory: Negative.   Cardiovascular: Negative.   Gastrointestinal: Negative.   Genitourinary: Negative.   Musculoskeletal: Negative.   Skin: Positive for rash.  Psychiatric/Behavioral: Positive for confusion and dysphoric mood.    Health Maintenance  Topic Date Due  .  FOOT EXAM  08/19/1938  . OPHTHALMOLOGY EXAM  08/19/1938  . INFLUENZA VACCINE  09/30/2018  . URINE MICROALBUMIN  11/24/2018  . TETANUS/TDAP  11/24/2026 (Originally 08/19/1947)  . HEMOGLOBIN A1C  04/14/2019  . DEXA SCAN  Completed  . PNA vac Low Risk Adult  Completed    Physical Exam: Vitals:   11/29/18 1447  BP: 134/76  Pulse: 95  Temp: (!) 97.3 F (36.3 C)  SpO2: 96%  Weight: 158 lb 12.8 oz (72 kg)   Body mass index is 31.01 kg/m. Physical Exam Vitals signs reviewed.  Constitutional:      Appearance: Normal appearance.  HENT:     Head: Normocephalic.     Nose: Nose normal.     Mouth/Throat:     Mouth: Mucous membranes are moist.     Pharynx: Oropharynx is  clear.  Eyes:     Pupils: Pupils are equal, round, and reactive to light.  Neck:     Musculoskeletal: Neck supple.  Cardiovascular:     Rate and Rhythm: Normal rate.     Pulses: Normal pulses.  Pulmonary:     Effort: Pulmonary effort is normal.     Breath sounds: Normal breath sounds.  Abdominal:     General: Abdomen is flat. Bowel sounds are normal.     Palpations: Abdomen is soft.  Skin:    Comments: Did not see any rash   Neurological:     General: No focal deficit present.     Mental Status: She is alert.  Psychiatric:     Comments: Seemed depressed     Labs reviewed: Basic Metabolic Panel: Recent Labs    03/06/18 0810 06/12/18 0000 10/12/18 0000  NA 140 140 142  K 4.4 4.2 4.6  CL 105 104 105  CO2 28 29 22   GLUCOSE 135* 160* 140*  BUN 13 14 11   CREATININE 0.70 0.72 0.77  CALCIUM 8.9 8.9 9.2  TSH 2.88  --  2.47   Liver Function Tests: Recent Labs    03/06/18 0810 06/12/18 0000 10/12/18 0000  AST 13 23 20   ALT 12 21 17   BILITOT 0.3 0.4 0.3  PROT 6.4 6.6 6.8   No results for input(s): LIPASE, AMYLASE in the last 8760 hours. No results for input(s): AMMONIA in the last 8760 hours. CBC: Recent Labs    03/06/18 0810 06/12/18 0000 10/12/18 0000  WBC 9.7 10.2 10.2  NEUTROABS 5,956 6,446 5,722  HGB 12.7 13.3 13.7  HCT 39.1 40.3 42.1  MCV 77.7* 80.1 81.6  PLT 323 295 316   Lipid Panel: Recent Labs    03/06/18 0810 10/12/18 0000  CHOL 135 136  HDL 33* 35*  LDLCALC 77 72  TRIG 151* 195*  CHOLHDL 4.1 3.9   Lab Results  Component Value Date   HGBA1C 7.5 (H) 10/12/2018    Procedures since last visit: No results found.  Assessment/Plan Rash Continue to use triamcinolone cream PRN Patient refused to go to Dermatology Cognitive impairment Speech therapy called me that the patient was taking 15 mg of Lipitor she was confused about her dose I told them to increase the dose to full tablet instead of half.at 10 mg Patient continues to  struggle with headaches this time she is not ready for further eval to take any new medicine.    Labs/tests ordered:  * No order type specified * Next appt:  04/19/2019  Total time spent in this patient care encounter was  25_  minutes; greater than 50%  of the visit spent counseling patient and staff, reviewing records , Labs and coordinating care for problems addressed at this encounter.

## 2018-12-06 ENCOUNTER — Encounter: Payer: Self-pay | Admitting: Internal Medicine

## 2018-12-15 ENCOUNTER — Telehealth: Payer: Self-pay | Admitting: *Deleted

## 2018-12-15 DIAGNOSIS — E038 Other specified hypothyroidism: Secondary | ICD-10-CM

## 2018-12-15 DIAGNOSIS — Z23 Encounter for immunization: Secondary | ICD-10-CM | POA: Diagnosis not present

## 2018-12-15 MED ORDER — LEVOTHYROXINE SODIUM 75 MCG PO TABS: 75.0000 ug | ORAL_TABLET | Freq: Every day | ORAL | 0 refills | Status: DC

## 2018-12-15 NOTE — Telephone Encounter (Signed)
Patient daughter called back Rx sent to Pharmacy.

## 2018-12-15 NOTE — Telephone Encounter (Signed)
Patient daughter, Curt Bears called and left message on clinical intake and stated that patient is out of town at [employer] with the other daughter and forgot her thyroid medication. Wants to know if we will send in a Rx to the pharmacy there for her medication.   LMOM for daughter to return my call to get pharmacy to send medication to.

## 2019-01-07 ENCOUNTER — Other Ambulatory Visit: Payer: Self-pay | Admitting: Internal Medicine

## 2019-01-07 DIAGNOSIS — E038 Other specified hypothyroidism: Secondary | ICD-10-CM

## 2019-01-18 ENCOUNTER — Telehealth: Payer: Self-pay | Admitting: *Deleted

## 2019-01-18 NOTE — Telephone Encounter (Signed)
Patient stated that she would like for you to place the referral to Dermatologist and she will go.

## 2019-01-18 NOTE — Telephone Encounter (Signed)
Patient called and stated that she is waking up during the night still itching and the cream is not working. Stated that she needs some relief. Stated that she saw you for an appointment for this problem and it has not resolved. Please Advise.

## 2019-01-18 NOTE — Telephone Encounter (Signed)
We had made appointment for her with Dermatology but she never made it to them. If she wants I can make referral again.

## 2019-01-19 ENCOUNTER — Other Ambulatory Visit: Payer: Self-pay | Admitting: Internal Medicine

## 2019-01-19 DIAGNOSIS — L509 Urticaria, unspecified: Secondary | ICD-10-CM

## 2019-01-19 NOTE — Telephone Encounter (Signed)
I have made a referal

## 2019-02-12 ENCOUNTER — Other Ambulatory Visit: Payer: Self-pay | Admitting: *Deleted

## 2019-02-12 MED ORDER — ATORVASTATIN CALCIUM 10 MG PO TABS: 10.0000 mg | ORAL_TABLET | Freq: Every day | ORAL | 1 refills | Status: DC

## 2019-02-12 NOTE — Telephone Encounter (Signed)
Patient daughter called and requested an updated Rx with correct directions to be sent to pharmacy. Stated that they received a Rx for 1/2 daily but she takes 1 daily. Rx sent.

## 2019-02-22 ENCOUNTER — Other Ambulatory Visit: Payer: Self-pay | Admitting: Family

## 2019-04-03 ENCOUNTER — Other Ambulatory Visit: Payer: Self-pay | Admitting: Nurse Practitioner

## 2019-04-03 ENCOUNTER — Non-Acute Institutional Stay: Payer: Medicare Other | Admitting: Nurse Practitioner

## 2019-04-03 ENCOUNTER — Encounter: Payer: Self-pay | Admitting: Nurse Practitioner

## 2019-04-03 ENCOUNTER — Other Ambulatory Visit: Payer: Self-pay

## 2019-04-03 DIAGNOSIS — R000 Tachycardia, unspecified: Secondary | ICD-10-CM | POA: Diagnosis not present

## 2019-04-03 DIAGNOSIS — R5381 Other malaise: Secondary | ICD-10-CM | POA: Diagnosis not present

## 2019-04-03 DIAGNOSIS — F0390 Unspecified dementia without behavioral disturbance: Secondary | ICD-10-CM | POA: Insufficient documentation

## 2019-04-03 DIAGNOSIS — R410 Disorientation, unspecified: Secondary | ICD-10-CM | POA: Diagnosis not present

## 2019-04-03 DIAGNOSIS — F0392 Unspecified dementia, unspecified severity, with psychotic disturbance: Secondary | ICD-10-CM | POA: Insufficient documentation

## 2019-04-03 DIAGNOSIS — E038 Other specified hypothyroidism: Secondary | ICD-10-CM

## 2019-04-03 DIAGNOSIS — R5383 Other fatigue: Secondary | ICD-10-CM

## 2019-04-03 HISTORY — DX: Tachycardia, unspecified: R00.0

## 2019-04-03 NOTE — Assessment & Plan Note (Signed)
Stable, TSH 2.47 10/12/18, continue Levothyroxine 69mcg qd.

## 2019-04-03 NOTE — Assessment & Plan Note (Signed)
Not new, it seems more slower mentation than her baseline after COVID vaccine yesterday, observe for mental status changes and focal neurological symptoms.

## 2019-04-03 NOTE — Patient Instructions (Signed)
The patient is encouraged to take oral fluids, Tylenol 667m every 6 hours as needed for chills and aches, CBC/diff, CMP/eGFR in morning. May consider ED evaluation if not getting better or neurological symptoms develop.

## 2019-04-03 NOTE — Assessment & Plan Note (Addendum)
Heart beats 110bpm, regular, denied chest pain/pressure or palpitation. Liked related her generalized fatigue, malaise, chills/aches, inadequate oral intake. Observe.

## 2019-04-03 NOTE — Progress Notes (Signed)
Location:   clinic [place] of Service:  Clinic (12) Provider: Marlana Latus NP  Code Status: DNR Goals of Care: [place] Advanced Directives 09/13/2018  Does Patient Have a Medical Advance Directive? No  Type of Advance Directive -  Does patient want to make changes to medical advance directive? -  Copy of [place] in Chart? -  Would patient like information on creating a medical advance directive? No - Patient declined     Chief Complaint  Patient presents with  . Medical Management of Chronic Issues    Post covid vaccinationation yesterday patient has been very sleepy, disoriented and difficulty walking according to daughter also here with patient in clinic.     HPI: [name] a 86 y.o. female seen today for c/o malaise, chills, poor appetite, seems more confused, she denied cough, chest pain/pressures, palpitation, sputum production, or focal weakness. She is afebrile, no O2 desaturation. Hx of Hypothyroidism, on Levothyroxine 40mg qd, TSH 2.47 10/12/18   Past Medical History:  Diagnosis Date  . Hearing loss   . History of fracture of clavicle   . Hypothyroidism   . Insomnia   . Tinnitus     Past Surgical History:  Procedure Laterality Date  . CHOLECYSTECTOMY  2007   Dr. HVerdene Lennert . CLAVICLE EXCISION  1986  . MOHS SURGERY     past 10 years chest & legs    Allergies  Allergen Reactions  . Metformin And Related Diarrhea    Allergies as of 04/03/2019      Reactions   Metformin And Related Diarrhea      Medication List       Accurate as of April 03, 2019  3:49 PM. If you have any questions, ask your nurse or doctor.        STOP taking these medications   temazepam 15 MG capsule Commonly known as: RESTORIL Stopped by: Latrisa Hellums X Vadim Centola, NP     TAKE these medications   atorvastatin 10 MG tablet Commonly known as: LIPITOR Take 1 tablet (10 mg total) by mouth daily.   calcium carbonate 600 MG tablet Commonly known as: OS-CAL Take 1 tablet  (600 mg total) by mouth 2 (two) times daily with a meal.   cetirizine 10 MG tablet Commonly known as: ZYRTEC Take 1 tablet (10 mg total) by mouth daily.   levothyroxine 75 MCG tablet Commonly known as: SYNTHROID TAKE 1 TABLET (75 MCG TOTAL) BY MOUTH DAILY BEFORE BREAKFAST.   Omega-3 1400 MG Caps Take by mouth daily.   triamcinolone cream 0.1 % Commonly known as: KENALOG APPLY TO AFFECTED AREA TWICE A DAY   Vitamin D3 50 MCG (2000 UT) Tabs Take by mouth. Take one Vitamin D daily      ROS was provided with assistance of the patient's daughter.  Review of Systems:  Review of Systems  Constitutional: Positive for activity change, appetite change, chills and fatigue. Negative for diaphoresis and fever.  HENT: Positive for hearing loss. Negative for congestion and voice change.   Eyes: Negative for visual disturbance.  Respiratory: Negative for cough, shortness of breath and wheezing.   Cardiovascular: Negative for chest pain, palpitations and leg swelling.  Gastrointestinal: Negative for abdominal distention, constipation, diarrhea, nausea and vomiting.  Genitourinary: Negative for difficulty urinating, dysuria and urgency.  Musculoskeletal: Positive for gait problem.  Skin: Negative for color change and pallor.  Neurological: Negative for dizziness, facial asymmetry, speech difficulty, weakness and headaches.  Confused.   Psychiatric/Behavioral: Positive for confusion. Negative for agitation, behavioral problems, hallucinations and sleep disturbance. The patient is not nervous/anxious.     Health Maintenance  Topic Date Due  . FOOT EXAM  08/19/1938  . OPHTHALMOLOGY EXAM  08/19/1938  . INFLUENZA VACCINE  09/30/2018  . URINE MICROALBUMIN  11/24/2018  . TETANUS/TDAP  11/24/2026 (Originally 08/19/1947)  . HEMOGLOBIN A1C  04/14/2019  . DEXA SCAN  Completed  . PNA vac Low Risk Adult  Completed    Physical Exam: Vitals:   04/03/19 1437  BP: (!) 158/66  Pulse: (!) 115    Temp: 99.1 F (37.3 C)  SpO2: 95%  Weight: 155 lb (70.3 kg)  Height: 5' (1.524 m)   Body mass index is 30.27 kg/m. Physical Exam Vitals and nursing note reviewed.  Constitutional:      General: She is not in acute distress.    Appearance: She is not ill-appearing, toxic-appearing or diaphoretic.     Comments: She seems tired but stayed awake during today's examination.   HENT:     Head: Normocephalic and atraumatic.     Nose: Nose normal.     Mouth/Throat:     Mouth: Mucous membranes are moist.  Eyes:     Extraocular Movements: Extraocular movements intact.     Conjunctiva/sclera: Conjunctivae normal.     Pupils: Pupils are equal, round, and reactive to light.  Cardiovascular:     Rate and Rhythm: Regular rhythm. Tachycardia present.     Heart sounds: No murmur.  Pulmonary:     Effort: Pulmonary effort is normal.     Breath sounds: No wheezing, rhonchi or rales.  Abdominal:     General: There is no distension.     Palpations: Abdomen is soft.     Tenderness: There is no abdominal tenderness. There is no right CVA tenderness, left CVA tenderness, guarding or rebound.     Comments: Protruding abd.   Musculoskeletal:     Cervical back: Normal range of motion and neck supple.     Right lower leg: No edema.     Left lower leg: No edema.  Skin:    General: Skin is warm and dry.  Neurological:     General: No focal deficit present.     Mental Status: She is alert. Mental status is at baseline.     Motor: No weakness.     Coordination: Coordination abnormal.     Gait: Gait abnormal.     Comments: Oriented to person, followed simple directions.   Psychiatric:        Mood and Affect: Mood normal.        Behavior: Behavior normal.     Labs reviewed: Basic Metabolic Panel: Recent Labs    06/12/18 0000 10/12/18 0000  NA 140 142  K 4.2 4.6  CL 104 105  CO2 29 22  GLUCOSE 160* 140*  BUN 14 11  CREATININE 0.72 0.77  CALCIUM 8.9 9.2  TSH  --  2.47   Liver  Function Tests: Recent Labs    06/12/18 0000 10/12/18 0000  AST 23 20  ALT 21 17  BILITOT 0.4 0.3  PROT 6.6 6.8   No results for input(s): LIPASE, AMYLASE in the last 8760 hours. No results for input(s): AMMONIA in the last 8760 hours. CBC: Recent Labs    06/12/18 0000 10/12/18 0000  WBC 10.2 10.2  NEUTROABS 6,446 5,722  HGB 13.3 13.7  HCT 40.3 42.1  MCV 80.1 81.6  PLT  295 316   Lipid Panel: Recent Labs    10/12/18 0000  CHOL 136  HDL 35*  LDLCALC 72  TRIG 195*  CHOLHDL 3.9   Lab Results  Component Value Date   HGBA1C 7.5 (H) 10/12/2018    Procedures since last visit: No results found.  Assessment/Plan  Malaise and fatigue Malaise, fatigue, chills/aches, poor appetite, slow mentation, may be related to COVID vaccine yesterday. The patient is encouraged to take oral fluids, Tylenol 654m every 6 hours as needed for chills and aches, CBC/diff, CMP/eGFR 04/05/19. May consider ED evaluation if not getting better or neurological symptoms develop.    Hypothyroidism Stable, TSH 2.47 10/12/18, continue Levothyroxine 722m qd.   Confusion Not new, it seems more slower mentation than her baseline after COVID vaccine yesterday, observe for mental status changes and focal neurological symptoms.   Tachycardia Heart beats 110bpm, regular, denied chest pain/pressure or palpitation. Liked related her generalized fatigue, malaise, chills/aches, inadequate oral intake. Observe.    Labs/tests ordered:  CBC/diff, CMP/eGFR  Next appt:  04/25/19

## 2019-04-03 NOTE — Assessment & Plan Note (Signed)
Malaise, fatigue, chills/aches, poor appetite, slow mentation, may be related to COVID vaccine yesterday. The patient is encouraged to take oral fluids, Tylenol 662m every 6 hours as needed for chills and aches, CBC/diff, CMP/eGFR 04/05/19. May consider ED evaluation if not getting better or neurological symptoms develop.

## 2019-04-05 ENCOUNTER — Other Ambulatory Visit: Payer: Self-pay

## 2019-04-05 DIAGNOSIS — E559 Vitamin D deficiency, unspecified: Secondary | ICD-10-CM

## 2019-04-05 DIAGNOSIS — E782 Mixed hyperlipidemia: Secondary | ICD-10-CM

## 2019-04-05 DIAGNOSIS — E039 Hypothyroidism, unspecified: Secondary | ICD-10-CM

## 2019-04-05 DIAGNOSIS — E1169 Type 2 diabetes mellitus with other specified complication: Secondary | ICD-10-CM

## 2019-04-06 LAB — LIPID PANEL
Cholesterol: 108 mg/dL (ref <200)
HDL: 30 mg/dL — ABNORMAL LOW (ref >=50)
LDL Cholesterol (Calc): 56 mg/dL (calc)
Non-HDL Cholesterol (Calc): 78 mg/dL (calc) (ref <130)
Total CHOL/HDL Ratio: 3.6 (calc) (ref <5.0)
Triglycerides: 138 mg/dL (ref <150)

## 2019-04-06 LAB — HEMOGLOBIN A1C
Hgb A1c MFr Bld: 9.9 % of total Hgb — ABNORMAL HIGH (ref <5.7)
Mean Plasma Glucose: 237 (calc)
eAG (mmol/L): 13.2 (calc)

## 2019-04-06 LAB — COMPLETE METABOLIC PANEL WITH GFR
AG Ratio: 1.3 (calc) (ref 1.0–2.5)
ALT: 25 U/L (ref 6–29)
AST: 19 U/L (ref 10–35)
Albumin: 3.6 g/dL (ref 3.6–5.1)
Alkaline phosphatase (APISO): 114 U/L (ref 37–153)
BUN: 16 mg/dL (ref 7–25)
CO2: 27 mmol/L (ref 20–32)
Calcium: 8.7 mg/dL (ref 8.6–10.4)
Chloride: 105 mmol/L (ref 98–110)
Creat: 0.79 mg/dL (ref 0.60–0.88)
GFR, Est African American: 77 mL/min/{1.73_m2} (ref >=60)
GFR, Est Non African American: 67 mL/min/{1.73_m2} (ref >=60)
Globulin: 2.8 g/dL (calc) (ref 1.9–3.7)
Glucose, Bld: 234 mg/dL — ABNORMAL HIGH (ref 65–99)
Potassium: 3.9 mmol/L (ref 3.5–5.3)
Sodium: 140 mmol/L (ref 135–146)
Total Bilirubin: 0.5 mg/dL (ref 0.2–1.2)
Total Protein: 6.4 g/dL (ref 6.1–8.1)

## 2019-04-06 LAB — TSH: TSH: 3.06 mIU/L (ref 0.40–4.50)

## 2019-04-06 LAB — VITAMIN D 25 HYDROXY (VIT D DEFICIENCY, FRACTURES): Vit D, 25-Hydroxy: 36 ng/mL (ref 30–100)

## 2019-04-18 ENCOUNTER — Encounter: Payer: Self-pay | Admitting: Internal Medicine

## 2019-04-18 ENCOUNTER — Encounter: Payer: Medicare Other | Admitting: Internal Medicine

## 2019-04-18 ENCOUNTER — Other Ambulatory Visit: Payer: Self-pay

## 2019-04-25 ENCOUNTER — Encounter: Payer: Self-pay | Admitting: Internal Medicine

## 2019-04-25 ENCOUNTER — Non-Acute Institutional Stay: Payer: Medicare Other | Admitting: Internal Medicine

## 2019-04-25 ENCOUNTER — Other Ambulatory Visit: Payer: Self-pay

## 2019-04-25 VITALS — BP 142/78 | HR 88 | Temp 97.8°F | Ht 60.0 in | Wt 154.0 lb

## 2019-04-25 DIAGNOSIS — E559 Vitamin D deficiency, unspecified: Secondary | ICD-10-CM

## 2019-04-25 DIAGNOSIS — E1169 Type 2 diabetes mellitus with other specified complication: Secondary | ICD-10-CM | POA: Diagnosis not present

## 2019-04-25 DIAGNOSIS — E782 Mixed hyperlipidemia: Secondary | ICD-10-CM | POA: Diagnosis not present

## 2019-04-25 DIAGNOSIS — E038 Other specified hypothyroidism: Secondary | ICD-10-CM

## 2019-04-25 DIAGNOSIS — F320 Major depressive disorder, single episode, mild: Secondary | ICD-10-CM

## 2019-04-25 DIAGNOSIS — E669 Obesity, unspecified: Secondary | ICD-10-CM

## 2019-04-25 MED ORDER — LINAGLIPTIN 5 MG PO TABS: 5.0000 mg | ORAL_TABLET | Freq: Every day | ORAL | 1 refills | Status: DC

## 2019-04-25 NOTE — Progress Notes (Signed)
Location:  [place] of Service:  Clinic (12)  Provider:   Code Status:  Goals of Care:  Advanced Directives 09/13/2018  Does Patient Have a Medical Advance Directive? No  Type of Advance Directive -  Does patient want to make changes to medical advance directive? -  Copy of [place] in Chart? -  Would patient like information on creating a medical advance directive? No - Patient declined     Chief Complaint  Patient presents with  . Medical Management of Chronic Issues    Follow up on blood sugars. Speech therapy expectations    HPI: [name] a 86 y.o. female seen today for medical management of chronic diseases.    Diabetes Mellitus Patient daughter says she is not watching her diet not exercising Her BS have come back really high and A1C is elevated Cognitive impairment [name] managing good with her ADLS Daughter is helping with her Meds Speech had recommended AL but family says they are not ready for that right npw Patient continues to be in Denial and does  Not want to consider that there is something wrong with her Memory Depression Seems more Depressed today. Not very interactive. Says she does not like Restrictions due to Covid and Isolation Also Upset that one of her daughter is moving to [place] from [place]  No [employer]. Does not use Cane or walker. Stays in her Room a lot and does not do Much Exrcise   Past Medical History:  Diagnosis Date  . Hearing loss   . History of fracture of clavicle   . Hypothyroidism   . Insomnia   . Tinnitus     Past Surgical History:  Procedure Laterality Date  . CHOLECYSTECTOMY  2007   Dr. Verdene Lennert  . CLAVICLE EXCISION  1986  . MOHS SURGERY     past 10 years chest & legs    Allergies  Allergen Reactions  . Metformin And Related Diarrhea    Outpatient Encounter Medications as of 04/25/2019  Medication Sig  . atorvastatin (LIPITOR) 10 MG tablet Take 1 tablet (10  mg total) by mouth daily.  . calcium carbonate (OS-CAL) 600 MG tablet Take 1 tablet (600 mg total) by mouth 2 (two) times daily with a meal.  . cetirizine (ZYRTEC) 10 MG tablet Take 1 tablet (10 mg total) by mouth daily.  . Cholecalciferol (VITAMIN D3) 2000 units TABS Take by mouth. Take one Vitamin D daily  . levothyroxine (SYNTHROID) 75 MCG tablet TAKE 1 TABLET (75 MCG TOTAL) BY MOUTH DAILY BEFORE BREAKFAST.  Marland Kitchen Omega-3 1400 MG CAPS Take by mouth daily.   Marland Kitchen triamcinolone cream (KENALOG) 0.1 % APPLY TO AFFECTED AREA TWICE A DAY   No facility-administered encounter medications on file as of 04/25/2019.    Review of Systems:  Review of Systems  Constitutional: Negative.   HENT: Negative.   Respiratory: Negative.   Cardiovascular: Negative.   Gastrointestinal: Negative.   Genitourinary: Negative.   Musculoskeletal: Negative.   Skin: Negative.   Neurological: Negative.   Psychiatric/Behavioral: Positive for confusion and dysphoric mood.    Health Maintenance  Topic Date Due  . FOOT EXAM  08/19/1938  . OPHTHALMOLOGY EXAM  08/19/1938  . URINE MICROALBUMIN  11/24/2018  . TETANUS/TDAP  11/24/2026 (Originally 08/19/1947)  . HEMOGLOBIN A1C  10/02/2019  . INFLUENZA VACCINE  Completed  . DEXA SCAN  Completed  . PNA vac Low Risk Adult  Completed    Physical  Exam: Vitals:   04/25/19 1502  BP: (!) 142/78  Pulse: 88  Temp: 97.8 F (36.6 C)  SpO2: 95%  Weight: 154 lb (69.9 kg)  Height: 5' (1.524 m)   Body mass index is 30.08 kg/m. Physical Exam  Constitutional: Oriented to person, place, and time. Well-developed and well-nourished.  HENT:  Head: Normocephalic.  Mouth/Throat: Oropharynx is clear and moist.  Eyes: Pupils are equal, round, and reactive to light.  Ears has Hearing Aid Some Wax Neck: Neck supple.  Cardiovascular: Normal rate and normal heart sounds.  No murmur heard. Pulmonary/Chest: Effort normal and breath sounds normal. No respiratory distress. No wheezes. She  has no rales.  Abdominal: Soft. Bowel sounds are normal. No distension. There is no tenderness. There is no rebound.  Musculoskeletal: No edema.  Lymphadenopathy: none Neurological: Alert and oriented to person, place, and time.  Gait is good and stable Skin: Skin is warm and dry.  Psychiatric: Normal mood and affect. Behavior is normal. Thought content normal.    Labs reviewed: Basic Metabolic Panel: Recent Labs    06/12/18 0000 10/12/18 0000 04/04/19 0928  NA 140 142 140  K 4.2 4.6 3.9  CL 104 105 105  CO2 29 22 27   GLUCOSE 160* 140* 234*  BUN 14 11 16   CREATININE 0.72 0.77 0.79  CALCIUM 8.9 9.2 8.7  TSH  --  2.47 3.06   Liver Function Tests: Recent Labs    06/12/18 0000 10/12/18 0000 04/04/19 0928  AST 23 20 19   ALT 21 17 25   BILITOT 0.4 0.3 0.5  PROT 6.6 6.8 6.4   No results for input(s): LIPASE, AMYLASE in the last 8760 hours. No results for input(s): AMMONIA in the last 8760 hours. CBC: Recent Labs    06/12/18 0000 10/12/18 0000  WBC 10.2 10.2  NEUTROABS 6,446 5,722  HGB 13.3 13.7  HCT 40.3 42.1  MCV 80.1 81.6  PLT 295 316   Lipid Panel: Recent Labs    10/12/18 0000 04/04/19 0928  CHOL 136 108  HDL 35* 30*  LDLCALC 72 56  TRIG 195* 138  CHOLHDL 3.9 3.6   Lab Results  Component Value Date   HGBA1C 9.9 (H) 04/04/2019    Procedures since last visit: No results found.  Assessment/Plan  Diabetes mellitus type 2 in obese (HCC) Allergic to Metformin with Diarrhea Will start her on Tradjenta 5 mg Follow up in 4 weeks Will need another med to be added She will also call her Ophthalmologist for Diabetic Exam  Mixed hyperlipidemia LDL less then 100 on Statin  Vitamin D deficiency Continue Supplement Level was good   hypothyroidism TSH Normal  Depression, major,  Patient continues to have Depression issues but does not like to talk about it. Really struggling with Pandemic restrictions Daughter does think we can consider  Antidepressant in next visit  Cognitive Impairment Speech Recommended AL but she is against it right now Daughter helping with her Meds. Will Repeat MMSE. Possible Namenda ? Rash  Seems to be resolved Labs/tests ordered:  * No order type specified * Next appt:  05/23/2019

## 2019-04-27 NOTE — Progress Notes (Signed)
A user error has taken place.

## 2019-05-02 ENCOUNTER — Telehealth: Payer: Self-pay

## 2019-05-02 MED ORDER — SITAGLIPTIN PHOSPHATE 25 MG PO TABS: 25.0000 mg | ORAL_TABLET | Freq: Every day | ORAL | 1 refills | Status: DC

## 2019-05-02 NOTE — Telephone Encounter (Signed)
Patient's daughter called stating the patient's new diabetes medication (she is unsure of the name) is unaffordable. Patient's cost is $492 after insurance.  They would like another medication and to keep in mind that she is allergic to Metformin.   I believe its the Tradjenta the daughter is concerned about. To Dr. Lyndel Safe

## 2019-05-02 NOTE — Telephone Encounter (Signed)
I am Going to Order Januvia for her. Let her know if this cost a lot too then let us know and I have to change it to something else.

## 2019-05-04 NOTE — Telephone Encounter (Signed)
Patient's daughter states the Celesta Gentile is also too expensive. She would also like to know if there is some info she can refer to for foods for her mom to help control her diabetes. She is looking for a 3rd medication option. To Dr. Lyndel Safe.

## 2019-05-07 NOTE — Telephone Encounter (Signed)
Can we have her to come and see me for Appointment so I can discuss her options ?

## 2019-05-08 ENCOUNTER — Encounter: Payer: Self-pay | Admitting: Nurse Practitioner

## 2019-05-08 ENCOUNTER — Other Ambulatory Visit: Payer: Self-pay

## 2019-05-08 ENCOUNTER — Non-Acute Institutional Stay: Payer: Medicare Other | Admitting: Nurse Practitioner

## 2019-05-08 DIAGNOSIS — E038 Other specified hypothyroidism: Secondary | ICD-10-CM | POA: Diagnosis not present

## 2019-05-08 DIAGNOSIS — E1169 Type 2 diabetes mellitus with other specified complication: Secondary | ICD-10-CM | POA: Diagnosis not present

## 2019-05-08 DIAGNOSIS — E669 Obesity, unspecified: Secondary | ICD-10-CM

## 2019-05-08 MED ORDER — BLOOD GLUCOSE MONITOR KIT: 1.0000 | PACK | Freq: Every day | 2 refills | Status: DC | PRN

## 2019-05-08 MED ORDER — GLIMEPIRIDE 2 MG PO TABS: 2.0000 mg | ORAL_TABLET | Freq: Every day | ORAL | 3 refills | Status: DC

## 2019-05-08 NOTE — Patient Instructions (Addendum)
Will try Amaryl 2mg  qd, needs to check CBG at home in am, bring CBG log to the next appointment.

## 2019-05-08 NOTE — Assessment & Plan Note (Signed)
Stable, continue Levothyroxine 58mcg qd, TSH 2.47 10/12/18

## 2019-05-08 NOTE — Assessment & Plan Note (Addendum)
blood sugar, T2DM, cannot tolerate Metformin, GI symptoms noted, Tradjenta/Januvia-cost issue. Hgb a1c 9.9 04/04/19 trended up from 7s a year ago.  Will try Amaryl 2mg  qd, needs to check CBG at home in am. Dietitian referral.

## 2019-05-08 NOTE — Progress Notes (Addendum)
Location:   clinic [place] of Service:  Clinic (12) Provider: Marlana Latus NP  Code Status: DNR Goals of Care: [place] Advanced Directives 05/08/2019  Does Patient Have a Medical Advance Directive? Yes  Type of Advance Directive [place]  Does patient want to make changes to medical advance directive? No - Patient declined  Copy of [place] in Chart? Yes - validated most recent copy scanned in chart (See row information)  Would patient like information on creating a medical advance directive? -     Chief Complaint  Patient presents with  . Medical Management of Chronic Issues    Follow up. Daughter-Maurie Kenton Kingfisher would like information on diabetes what type of food to buy for patient. Patient's daughter would also like to know what may happen if patient is unable to take medication due to cost.   . Health Maintenance    Foot and eye exam, urine microalbumin    HPI: [name] a 86 y.o. female seen today for an acute visit for blood sugar, T2DM, cannot tolerate Metformin, GI symptoms noted, Tradjenta/Januvia, cost issue. Hgb a1c 9.9 04/04/19 trended up from 7s a year ago.   Hx Hypothyroidism. TSH 3.06, on Levothyroxine 70mg qd.   Past Medical History:  Diagnosis Date  . Hearing loss   . History of fracture of clavicle   . Hypothyroidism   . Insomnia   . Tinnitus     Past Surgical History:  Procedure Laterality Date  . CHOLECYSTECTOMY  2007   Dr. HVerdene Lennert . CLAVICLE EXCISION  1986  . MOHS SURGERY     past 10 years chest & legs    Allergies  Allergen Reactions  . Metformin And Related Diarrhea    Allergies as of 05/08/2019      Reactions   Metformin And Related Diarrhea      Medication List       Accurate as of May 08, 2019  3:36 PM. If you have any questions, ask your nurse or doctor.        atorvastatin 10 MG tablet Commonly known as: LIPITOR Take 1 tablet (10 mg total) by mouth daily.   blood glucose meter kit and  supplies Kit 1 each by Does not apply route daily as needed. Dispense based on patient and insurance preference. Use up to four times daily as directed. (FOR ICD-9 250.00, 250.01). What changed: how much to take Changed by: Kaleth Koy X Galit Urich, NP   calcium carbonate 600 MG tablet Commonly known as: OS-CAL Take 1 tablet (600 mg total) by mouth 2 (two) times daily with a meal.   cetirizine 10 MG tablet Commonly known as: ZYRTEC Take 1 tablet (10 mg total) by mouth daily.   glimepiride 2 MG tablet Commonly known as: AMARYL Take 1 tablet (2 mg total) by mouth daily before breakfast. Started by: Sharmon Cheramie X Oluwaferanmi Wain, NP   levothyroxine 75 MCG tablet Commonly known as: SYNTHROID TAKE 1 TABLET (75 MCG TOTAL) BY MOUTH DAILY BEFORE BREAKFAST.   linagliptin 5 MG Tabs tablet Commonly known as: Tradjenta Take 1 tablet (5 mg total) by mouth daily.   Omega-3 1400 MG Caps Take by mouth daily.   sitaGLIPtin 25 MG tablet Commonly known as: Januvia Take 1 tablet (25 mg total) by mouth daily.   triamcinolone cream 0.1 % Commonly known as: KENALOG APPLY TO AFFECTED AREA TWICE A DAY   Vitamin D3 50 MCG (2000 UT) Tabs Take by mouth. Take one  Vitamin D daily      ROS was provided with assistance of staff.  Review of Systems:  Review of Systems  Constitutional: Negative for activity change, appetite change, chills, diaphoresis, fatigue and fever.  HENT: Positive for hearing loss. Negative for congestion and voice change.   Eyes: Negative for visual disturbance.  Respiratory: Negative for cough, shortness of breath and wheezing.   Cardiovascular: Negative for chest pain, palpitations and leg swelling.  Gastrointestinal: Negative for abdominal distention, constipation, nausea and vomiting.  Genitourinary: Negative for difficulty urinating, dysuria and urgency.  Musculoskeletal: Positive for gait problem.  Skin: Negative for color change and pallor.  Neurological: Negative for dizziness, speech difficulty  and weakness.       Confused.   Psychiatric/Behavioral: Positive for confusion. Negative for agitation, behavioral problems, hallucinations and sleep disturbance. The patient is not nervous/anxious.     Health Maintenance  Topic Date Due  . FOOT EXAM  08/19/1938  . OPHTHALMOLOGY EXAM  08/19/1938  . URINE MICROALBUMIN  11/24/2018  . TETANUS/TDAP  11/24/2026 (Originally 08/19/1947)  . HEMOGLOBIN A1C  10/02/2019  . INFLUENZA VACCINE  Completed  . DEXA SCAN  Completed  . PNA vac Low Risk Adult  Completed    Physical Exam: Vitals:   05/08/19 1406  BP: (!) 154/80  Pulse: 90  Temp: 97.6 F (36.4 C)  SpO2: 97%  Weight: 158 lb (71.7 kg)  Height: 5' (1.524 m)   Body mass index is 30.86 kg/m. Physical Exam Vitals and nursing note reviewed.  Constitutional:      General: She is not in acute distress.    Appearance: She is not ill-appearing.     Comments: She seems tired but stayed awake during today's examination.   HENT:     Head: Normocephalic and atraumatic.     Nose: Nose normal.     Mouth/Throat:     Mouth: Mucous membranes are moist.  Eyes:     Extraocular Movements: Extraocular movements intact.     Conjunctiva/sclera: Conjunctivae normal.     Pupils: Pupils are equal, round, and reactive to light.  Cardiovascular:     Rate and Rhythm: Regular rhythm. Tachycardia present.     Heart sounds: No murmur.  Pulmonary:     Effort: Pulmonary effort is normal.     Breath sounds: No wheezing or rales.  Abdominal:     General: There is no distension.     Palpations: Abdomen is soft.     Tenderness: There is no abdominal tenderness. There is no guarding or rebound.     Comments: Protruding abd.   Musculoskeletal:     Cervical back: Normal range of motion and neck supple.     Right lower leg: No edema.     Left lower leg: No edema.  Skin:    General: Skin is warm and dry.  Neurological:     General: No focal deficit present.     Mental Status: She is alert. Mental status  is at baseline.     Motor: No weakness.     Coordination: Coordination abnormal.     Gait: Gait abnormal.     Comments: Oriented to person, followed simple directions.   Psychiatric:        Mood and Affect: Mood normal.        Behavior: Behavior normal.     Labs reviewed: Basic Metabolic Panel: Recent Labs    06/12/18 0000 10/12/18 0000 04/04/19 0928  NA 140 142 140  K 4.2 4.6  3.9  CL 104 105 105  CO2 '29 22 27  ' GLUCOSE 160* 140* 234*  BUN '14 11 16  ' CREATININE 0.72 0.77 0.79  CALCIUM 8.9 9.2 8.7  TSH  --  2.47 3.06   Liver Function Tests: Recent Labs    06/12/18 0000 10/12/18 0000 04/04/19 0928  AST '23 20 19  ' ALT '21 17 25  ' BILITOT 0.4 0.3 0.5  PROT 6.6 6.8 6.4   No results for input(s): LIPASE, AMYLASE in the last 8760 hours. No results for input(s): AMMONIA in the last 8760 hours. CBC: Recent Labs    06/12/18 0000 10/12/18 0000  WBC 10.2 10.2  NEUTROABS 6,446 5,722  HGB 13.3 13.7  HCT 40.3 42.1  MCV 80.1 81.6  PLT 295 316   Lipid Panel: Recent Labs    10/12/18 0000 04/04/19 0928  CHOL 136 108  HDL 35* 30*  LDLCALC 72 56  TRIG 195* 138  CHOLHDL 3.9 3.6   Lab Results  Component Value Date   HGBA1C 9.9 (H) 04/04/2019    Procedures since last visit: No results found.  Assessment/Plan Diabetes mellitus type 2 in obese (HCC) blood sugar, T2DM, cannot tolerate Metformin, GI symptoms noted, Tradjenta/Januvia-cost issue. Hgb a1c 9.9 04/04/19 trended up from 7s a year ago.  Will try Amaryl 26m qd, needs to check CBG at home in am. Dietitian referral.   Hypothyroidism Stable, continue Levothyroxine 777m qd, TSH 2.47 10/12/18    Labs/tests ordered:  None  Next appt: 4 weeks with Dr. GuLyndel Safe

## 2019-05-09 ENCOUNTER — Telehealth: Payer: Self-pay | Admitting: *Deleted

## 2019-05-09 DIAGNOSIS — E1169 Type 2 diabetes mellitus with other specified complication: Secondary | ICD-10-CM

## 2019-05-09 MED ORDER — BLOOD GLUCOSE MONITOR KIT: 1.0000 | PACK | Freq: Every day | 2 refills | Status: DC | PRN

## 2019-05-09 NOTE — Telephone Encounter (Signed)
Lisa Castillo with [facility] [address] called and stated that they received a verbal yesterday for Blood Glucose Meter but he stated that the Meter, Strips and Lancets have to be sent Escribed separately with Diagnosis code in order for insurance to accept.

## 2019-05-09 NOTE — Telephone Encounter (Signed)
E-scribed to pharmacy

## 2019-05-11 ENCOUNTER — Telehealth: Payer: Self-pay

## 2019-05-11 DIAGNOSIS — E1169 Type 2 diabetes mellitus with other specified complication: Secondary | ICD-10-CM

## 2019-05-11 MED ORDER — CVS GLUCOSE METER TEST STRIPS VI STRP: ORAL_STRIP | 2 refills | Status: DC

## 2019-05-11 MED ORDER — CVS LANCETS ORIGINAL MISC: 1.0000 | Freq: Every day | 2 refills | Status: DC

## 2019-05-11 MED ORDER — ASSURE PRO BLOOD GLUCOSE METER DEVI: 1.0000 | Freq: Every day | 0 refills | Status: DC

## 2019-05-11 MED ORDER — BLOOD GLUCOSE MONITOR KIT: 1.0000 | PACK | Freq: Every day | 2 refills | Status: DC | PRN

## 2019-05-11 NOTE — Telephone Encounter (Signed)
Maria with [facility] called and stated that the Rx's sent to them has to include a Diagnosis code in order for insurance to cover Rx.  Refaxed Rx's with Diagnosis code.

## 2019-05-11 NOTE — Telephone Encounter (Signed)
Lisa Castillo from [facility] sttes they closed out the request on the 10th but she was not there and not seeing the Rx order and request that it be resent.

## 2019-05-23 ENCOUNTER — Encounter: Payer: Self-pay | Admitting: Internal Medicine

## 2019-05-23 ENCOUNTER — Telehealth: Payer: Self-pay

## 2019-05-23 DIAGNOSIS — E1169 Type 2 diabetes mellitus with other specified complication: Secondary | ICD-10-CM

## 2019-05-23 MED ORDER — BLOOD GLUCOSE MONITORING SUPPL KIT: 1.0000 | PACK | Freq: Four times a day (QID) | 0 refills | Status: DC | PRN

## 2019-05-23 MED ORDER — LANCETS MISC. KIT: 1.0000 | PACK | Freq: Every day | 0 refills | Status: DC

## 2019-05-23 MED ORDER — GLUCOSE BLOOD VI STRP: 1.0000 | ORAL_STRIP | 0 refills | Status: DC | PRN

## 2019-05-23 NOTE — Telephone Encounter (Signed)
Patient has not been able to get her meter kit to check her blood sugars because the way its written on prescription and need to be for a standard kit. Sent to pharmacy.

## 2019-05-24 ENCOUNTER — Telehealth: Payer: Self-pay

## 2019-05-24 DIAGNOSIS — E1169 Type 2 diabetes mellitus with other specified complication: Secondary | ICD-10-CM

## 2019-05-24 MED ORDER — LANCETS MISC. KIT: 1.0000 | PACK | Freq: Every day | 0 refills | Status: DC

## 2019-05-24 MED ORDER — BLOOD GLUCOSE MONITORING SUPPL KIT: 1.0000 | PACK | Freq: Four times a day (QID) | 0 refills | Status: DC | PRN

## 2019-05-24 MED ORDER — GLUCOSE BLOOD VI STRP: 1.0000 | ORAL_STRIP | 0 refills | Status: DC | PRN

## 2019-05-24 NOTE — Telephone Encounter (Signed)
After closing previous encounter, I decided to call and make sure [facility] received the order okay. They did not, according to a floater at the [facility]. Resending and leaving encounter open until its confirmed they received and approve order.

## 2019-05-25 MED ORDER — GLUCOSE BLOOD VI STRP: ORAL_STRIP | 3 refills | Status: DC

## 2019-05-25 MED ORDER — BLOOD GLUCOSE MONITORING SUPPL KIT: PACK | 0 refills | Status: DC

## 2019-05-25 MED ORDER — LANCETS 30G MISC: 3 refills | Status: DC

## 2019-05-25 NOTE — Telephone Encounter (Signed)
Blood sugar testing supplies has to have Dx on them in order for insurance to cover.  Rx's resent to pharmacy.

## 2019-06-13 ENCOUNTER — Other Ambulatory Visit: Payer: Self-pay

## 2019-06-13 ENCOUNTER — Non-Acute Institutional Stay: Payer: Medicare Other | Admitting: Internal Medicine

## 2019-06-13 ENCOUNTER — Encounter: Payer: Self-pay | Admitting: Internal Medicine

## 2019-06-13 VITALS — BP 136/74 | HR 95 | Temp 97.9°F | Ht 60.0 in | Wt 150.8 lb

## 2019-06-13 DIAGNOSIS — E782 Mixed hyperlipidemia: Secondary | ICD-10-CM

## 2019-06-13 DIAGNOSIS — E669 Obesity, unspecified: Secondary | ICD-10-CM

## 2019-06-13 DIAGNOSIS — F320 Major depressive disorder, single episode, mild: Secondary | ICD-10-CM | POA: Diagnosis not present

## 2019-06-13 DIAGNOSIS — E559 Vitamin D deficiency, unspecified: Secondary | ICD-10-CM | POA: Diagnosis not present

## 2019-06-13 DIAGNOSIS — E1169 Type 2 diabetes mellitus with other specified complication: Secondary | ICD-10-CM

## 2019-06-13 DIAGNOSIS — F0390 Unspecified dementia without behavioral disturbance: Secondary | ICD-10-CM

## 2019-06-13 MED ORDER — SERTRALINE HCL 25 MG PO TABS: 25.0000 mg | ORAL_TABLET | Freq: Every day | ORAL | 2 refills | Status: DC

## 2019-06-13 NOTE — Progress Notes (Signed)
Location: [place] of Service:  Clinic (12)  Provider:   Code Status:  Goals of Care:  Advanced Directives 05/08/2019  Does Patient Have a Medical Advance Directive? Yes  Type of Advance Directive [place]  Does patient want to make changes to medical advance directive? No - Patient declined  Copy of [place] in Chart? Yes - validated most recent copy scanned in chart (See row information)  Would patient like information on creating a medical advance directive? -     Chief Complaint  Patient presents with  . Medical Management of Chronic Issues    HPI: [name] a 86 y.o. female seen today for an acute visit for Follow up  Diabetes Mellitus Tolerating Amaryl Not able to check her BS yet Has not seen Ophthalmologist yet No Symptoms of Hypoglycemia Cognitive Impairnment Continues to do well with her ADLS Daughter helping with Meds Speech had recommended AL but family says they are not ready for that right npw Patient continues to be in Denial and does  Not want to consider that there is something wrong with her Memory Depression Per daughter this is bigger issue. Patient says she is depressed due to general decline of her condition. Daughter and patient having difficult time as she is needing more Guidance with her Meds and other IADLS But she continues to walk with no assists. No Falls.   Past Medical History:  Diagnosis Date  . Hearing loss   . History of fracture of clavicle   . Hypothyroidism   . Insomnia   . Tinnitus     Past Surgical History:  Procedure Laterality Date  . CHOLECYSTECTOMY  2007   Dr. Verdene Lennert  . CLAVICLE EXCISION  1986  . MOHS SURGERY     past 10 years chest & legs    Allergies  Allergen Reactions  . Metformin And Related Diarrhea    Outpatient Encounter Medications as of 06/13/2019  Medication Sig  . atorvastatin (LIPITOR) 10 MG tablet Take 1 tablet (10 mg total) by mouth  daily.  . Blood Glucose Monitoring Suppl KIT Use to test blood sugar once daily daily. Dx: E11.69  . calcium carbonate (OS-CAL) 600 MG tablet Take 1 tablet (600 mg total) by mouth 2 (two) times daily with a meal. (Patient taking differently: Take 600 mg by mouth daily. )  . cetirizine (ZYRTEC) 10 MG tablet Take 1 tablet (10 mg total) by mouth daily.  . Cholecalciferol (VITAMIN D3) 2000 units TABS Take by mouth. Take one Vitamin D daily  . glimepiride (AMARYL) 2 MG tablet Take 1 tablet (2 mg total) by mouth daily before breakfast.  . glucose blood test strip Use to test blood sugar once daily. Dx: E11.69  . Lancets 30G MISC Use to test blood sugar once daily. Dx: E11.69  . Lancets Misc. KIT 1 Device by Does not apply route daily. Use as directed  . levothyroxine (SYNTHROID) 75 MCG tablet TAKE 1 TABLET (75 MCG TOTAL) BY MOUTH DAILY BEFORE BREAKFAST.  Marland Kitchen Omega-3 1400 MG CAPS Take by mouth daily.   Marland Kitchen triamcinolone cream (KENALOG) 0.1 % APPLY TO AFFECTED AREA TWICE A DAY (Patient not taking: Reported on 06/13/2019)  . [DISCONTINUED] linagliptin (TRADJENTA) 5 MG TABS tablet Take 1 tablet (5 mg total) by mouth daily. (Patient not taking: Reported on 06/13/2019)  . [DISCONTINUED] sitaGLIPtin (JANUVIA) 25 MG tablet Take 1 tablet (25 mg total) by mouth daily. (Patient not taking: Reported on  06/13/2019)   No facility-administered encounter medications on file as of 06/13/2019.    Review of Systems:  Review of Systems  Health Maintenance  Topic Date Due  . FOOT EXAM  Never done  . OPHTHALMOLOGY EXAM  Never done  . URINE MICROALBUMIN  11/24/2018  . TETANUS/TDAP  11/24/2026 (Originally 08/19/1947)  . INFLUENZA VACCINE  09/30/2019  . HEMOGLOBIN A1C  10/02/2019  . DEXA SCAN  Completed  . PNA vac Low Risk Adult  Completed    Physical Exam: Vitals:   06/13/19 1634  BP: 136/74  Pulse: 95  Temp: 97.9 F (36.6 C)  SpO2: 97%  Weight: 150 lb 12.8 oz (68.4 kg)  Height: 5' (1.524 m)   Body mass index  is 29.45 kg/m. Physical Exam  Labs reviewed: Basic Metabolic Panel: Recent Labs    10/12/18 0000 04/04/19 0928  NA 142 140  K 4.6 3.9  CL 105 105  CO2 22 27  GLUCOSE 140* 234*  BUN 11 16  CREATININE 0.77 0.79  CALCIUM 9.2 8.7  TSH 2.47 3.06   Liver Function Tests: Recent Labs    10/12/18 0000 04/04/19 0928  AST 20 19  ALT 17 25  BILITOT 0.3 0.5  PROT 6.8 6.4   No results for input(s): LIPASE, AMYLASE in the last 8760 hours. No results for input(s): AMMONIA in the last 8760 hours. CBC: Recent Labs    10/12/18 0000  WBC 10.2  NEUTROABS 5,722  HGB 13.7  HCT 42.1  MCV 81.6  PLT 316   Lipid Panel: Recent Labs    10/12/18 0000 04/04/19 0928  CHOL 136 108  HDL 35* 30*  LDLCALC 72 56  TRIG 195* 138  CHOLHDL 3.9 3.6   Lab Results  Component Value Date   HGBA1C 9.9 (H) 04/04/2019    Procedures since last visit: No results found.  Assessment/Plan Diabetes mellitus type 2 in obese (HCC) Cannot take Metformin due to Diarrhea Could not Afford Januvia or Tradjenta Was started on Amaryl D/w daughter to check CBG 1-2 times a week Also Folow up with Opthalmologist   Mixed hyperlipidemia LDL good on Statin  Vitamin D deficiency Level Low Normal On Supplement Depression, major, single episode, mild (HCC) Will start on Zoloft 25 mg QD  Dementia without behavioral disturbance,  MMSE is 21/30 Failed Clock Drawing Will consider Namenda Next visit   Hypothyroidism TSH normal in 2/21 DEXA in 1/20 T Score -1.8  Labs/tests ordered:  * No order type specified * Next appt:  Visit date not found  Total time spent in this patient care encounter was  45_  minutes; greater than 50% of the visit spent counseling patient and staff, reviewing records , Labs and coordinating care for problems addressed at this encounter.

## 2019-07-06 ENCOUNTER — Other Ambulatory Visit: Payer: Self-pay | Admitting: Internal Medicine

## 2019-07-11 ENCOUNTER — Non-Acute Institutional Stay: Payer: Medicare Other | Admitting: Internal Medicine

## 2019-07-11 ENCOUNTER — Encounter: Payer: Self-pay | Admitting: Internal Medicine

## 2019-07-11 ENCOUNTER — Other Ambulatory Visit: Payer: Self-pay

## 2019-07-11 VITALS — BP 126/70 | HR 84 | Temp 97.4°F | Ht 60.0 in | Wt 152.6 lb

## 2019-07-11 DIAGNOSIS — E669 Obesity, unspecified: Secondary | ICD-10-CM

## 2019-07-11 DIAGNOSIS — E782 Mixed hyperlipidemia: Secondary | ICD-10-CM

## 2019-07-11 DIAGNOSIS — F320 Major depressive disorder, single episode, mild: Secondary | ICD-10-CM

## 2019-07-11 DIAGNOSIS — F0390 Unspecified dementia without behavioral disturbance: Secondary | ICD-10-CM

## 2019-07-11 DIAGNOSIS — E1169 Type 2 diabetes mellitus with other specified complication: Secondary | ICD-10-CM

## 2019-07-12 NOTE — Progress Notes (Signed)
Location: [place] of Service:  Clinic (12)  Provider:   Code Status:  Goals of Care:  Advanced Directives 05/08/2019  Does Patient Have a Medical Advance Directive? Yes  Type of Advance Directive [place]  Does patient want to make changes to medical advance directive? No - Patient declined  Copy of [place] in Chart? Yes - validated most recent copy scanned in chart (See row information)  Would patient like information on creating a medical advance directive? -     Chief Complaint  Patient presents with  . Medical Management of Chronic Issues    4 week follow up. No concerns  . Health Maintenance    [name] not seeing an eye or foot doctor. Due for urine microalbumin    HPI: [name] a 86 y.o. female seen today for an acute visit for Follow up of her Diabetes Mellitus  Diabetes Mellitus Per daughter her CBG few that she had recorded were around 140-150 ON AMaryl Does not watch her diet  Cognitive impairment Continues to do well with her ADLs Daughter helps with medications Speech had recommended AL but family says they are not ready for that right npw Patient continues to be in Denial  Depression Was started on Zoloft Daughter states that patient's mood is much better.  But had noticed that she was sleeping more.  But she continues to walk with no assists. No Falls.   Past Medical History:  Diagnosis Date  . Hearing loss   . History of fracture of clavicle   . Hypothyroidism   . Insomnia   . Tinnitus     Past Surgical History:  Procedure Laterality Date  . CHOLECYSTECTOMY  2007   Dr. Verdene Lennert  . CLAVICLE EXCISION  1986  . MOHS SURGERY     past 10 years chest & legs    Allergies  Allergen Reactions  . Metformin And Related Diarrhea    Outpatient Encounter Medications as of 07/11/2019  Medication Sig  . atorvastatin (LIPITOR) 10 MG tablet Take 1 tablet (10 mg total) by mouth daily.  .  Blood Glucose Monitoring Suppl KIT Use to test blood sugar once daily daily. Dx: E11.69  . calcium carbonate (OSCAL) 1500 (600 Ca) MG TABS tablet Take 600 mg of elemental calcium by mouth daily.  . cetirizine (ZYRTEC) 10 MG tablet Take 1 tablet (10 mg total) by mouth daily.  . Cholecalciferol (VITAMIN D3) 2000 units TABS Take by mouth. Take one Vitamin D daily  . glimepiride (AMARYL) 2 MG tablet Take 1 tablet (2 mg total) by mouth daily before breakfast.  . glucose blood test strip Use to test blood sugar once daily. Dx: E11.69  . Lancets 30G MISC Use to test blood sugar once daily. Dx: E11.69  . Lancets Misc. KIT 1 Device by Does not apply route daily. Use as directed  . levothyroxine (SYNTHROID) 75 MCG tablet TAKE 1 TABLET (75 MCG TOTAL) BY MOUTH DAILY BEFORE BREAKFAST.  Marland Kitchen Omega-3 1400 MG CAPS Take by mouth daily.   . sertraline (ZOLOFT) 25 MG tablet TAKE 1 TABLET BY MOUTH EVERY DAY  . [DISCONTINUED] calcium carbonate (OS-CAL) 600 MG tablet Take 1 tablet (600 mg total) by mouth 2 (two) times daily with a meal. (Patient taking differently: Take 600 mg by mouth daily. )  . [DISCONTINUED] triamcinolone cream (KENALOG) 0.1 % APPLY TO AFFECTED AREA TWICE A DAY (Patient not taking: Reported on 06/13/2019)   No  facility-administered encounter medications on file as of 07/11/2019.    Review of Systems:  Review of Systems  Review of Systems  Constitutional: Negative for activity change, appetite change, chills, diaphoresis, fatigue and fever.  HENT: Negative for mouth sores, postnasal drip, rhinorrhea, sinus pain and sore throat.   Respiratory: Negative for apnea, cough, chest tightness, shortness of breath and wheezing.   Cardiovascular: Negative for chest pain, palpitations and leg swelling.  Gastrointestinal: Negative for abdominal distention, abdominal pain, constipation, diarrhea, nausea and vomiting.  Genitourinary: Negative for dysuria and frequency.  Musculoskeletal: Negative for  arthralgias, joint swelling and myalgias.  Skin: Negative for rash.  Neurological: Negative for dizziness, syncope, weakness, light-headedness and numbness.  Psychiatric/Behavioral: Negative for behavioral problems, confusion and sleep disturbance.     Health Maintenance  Topic Date Due  . FOOT EXAM  Never done  . OPHTHALMOLOGY EXAM  Never done  . URINE MICROALBUMIN  11/24/2018  . TETANUS/TDAP  11/24/2026 (Originally 08/19/1947)  . INFLUENZA VACCINE  09/30/2019  . HEMOGLOBIN A1C  10/02/2019  . DEXA SCAN  Completed  . COVID-19 Vaccine  Completed  . PNA vac Low Risk Adult  Completed    Physical Exam: Vitals:   07/11/19 1543  BP: 126/70  Pulse: 84  Temp: (!) 97.4 F (36.3 C)  SpO2: 97%  Weight: 152 lb 9.6 oz (69.2 kg)  Height: 5' (1.524 m)   Body mass index is 29.8 kg/m. Physical Exam Constitutional: Oriented to person, place, and time. Well-developed and well-nourished.  HENT:  Head: Normocephalic.  Mouth/Throat: Oropharynx is clear and moist.  Eyes: Pupils are equal, round, and reactive to light.  Neck: Neck supple.  Cardiovascular: Normal rate and normal heart sounds.  No murmur heard. Pulmonary/Chest: Effort normal and breath sounds normal. No respiratory distress. No wheezes. She has no rales.  Abdominal: Soft. Bowel sounds are normal. No distension. There is no tenderness. There is no rebound.  Musculoskeletal: No edema. Foot Exam Sensation Normal. DP Palpable No Open Lesions Lymphadenopathy: none Neurological: Alert and oriented to person, place, and time. No Focal Deficits Skin: Skin is warm and dry.  Psychiatric: Normal mood and affect. Behavior is normal. Thought content normal.   Labs reviewed: Basic Metabolic Panel: Recent Labs    10/12/18 0000 04/04/19 0928  NA 142 140  K 4.6 3.9  CL 105 105  CO2 22 27  GLUCOSE 140* 234*  BUN 11 16  CREATININE 0.77 0.79  CALCIUM 9.2 8.7  TSH 2.47 3.06   Liver Function Tests: Recent Labs    10/12/18 0000  04/04/19 0928  AST 20 19  ALT 17 25  BILITOT 0.3 0.5  PROT 6.8 6.4   No results for input(s): LIPASE, AMYLASE in the last 8760 hours. No results for input(s): AMMONIA in the last 8760 hours. CBC: Recent Labs    10/12/18 0000  WBC 10.2  NEUTROABS 5,722  HGB 13.7  HCT 42.1  MCV 81.6  PLT 316   Lipid Panel: Recent Labs    10/12/18 0000 04/04/19 0928  CHOL 136 108  HDL 35* 30*  LDLCALC 72 56  TRIG 195* 138  CHOLHDL 3.9 3.6   Lab Results  Component Value Date   HGBA1C 9.9 (H) 04/04/2019    Procedures since last visit: No results found.  Assessment/Plan Diabetes mellitus type 2 in obese ([place]) Cannot take Metformin due to Diarrhea Could not Afford Januvia or Tradjenta Was started on Amaryl CBG r near 140-150  Mixed hyperlipidemia LDL good on Statin  Vitamin D deficiency Level Low Normal On Supplement Depression, major, single episode, mild ([place]) Doing well on Zoloft Continue to monitor  Dementia without behavioral disturbance,  MMSE is 21/30 Failed Clock Drawing Daughter interested in [place] Consider in Next Appointment   Hypothyroidism TSH normal in 2/21 DEXA in 1/20 T Score -1.8   Labs/tests ordered:  * No order type specified * Next appt:  08/16/2019

## 2019-07-13 ENCOUNTER — Encounter: Payer: Self-pay | Admitting: Internal Medicine

## 2019-07-27 ENCOUNTER — Other Ambulatory Visit: Payer: Self-pay | Admitting: Nurse Practitioner

## 2019-07-27 DIAGNOSIS — E669 Obesity, unspecified: Secondary | ICD-10-CM

## 2019-07-27 NOTE — Telephone Encounter (Signed)
Patient has pending lab appointment in June to confirm medication is controlling blood sugars

## 2019-08-14 ENCOUNTER — Other Ambulatory Visit: Payer: Self-pay | Admitting: Internal Medicine

## 2019-08-14 DIAGNOSIS — E038 Other specified hypothyroidism: Secondary | ICD-10-CM

## 2019-08-16 ENCOUNTER — Other Ambulatory Visit: Payer: Self-pay

## 2019-08-16 DIAGNOSIS — E669 Obesity, unspecified: Secondary | ICD-10-CM

## 2019-08-16 DIAGNOSIS — E1169 Type 2 diabetes mellitus with other specified complication: Secondary | ICD-10-CM

## 2019-08-17 LAB — CBC WITH DIFFERENTIAL/PLATELET
Absolute Monocytes: 718 cells/uL (ref 200–950)
Basophils Absolute: 42 cells/uL (ref 0–200)
Basophils Relative: 0.4 %
Eosinophils Absolute: 239 cells/uL (ref 15–500)
Eosinophils Relative: 2.3 %
HCT: 39.7 % (ref 35.0–45.0)
Hemoglobin: 12.9 g/dL (ref 11.7–15.5)
Lymphs Abs: 2725 cells/uL (ref 850–3900)
MCH: 25.8 pg — ABNORMAL LOW (ref 27.0–33.0)
MCHC: 32.5 g/dL (ref 32.0–36.0)
MCV: 79.4 fL — ABNORMAL LOW (ref 80.0–100.0)
MPV: 9.8 fL (ref 7.5–12.5)
Monocytes Relative: 6.9 %
Neutro Abs: 6677 cells/uL (ref 1500–7800)
Neutrophils Relative %: 64.2 %
Platelets: 328 10*3/uL (ref 140–400)
RBC: 5 10*6/uL (ref 3.80–5.10)
RDW: 13.3 % (ref 11.0–15.0)
Total Lymphocyte: 26.2 %
WBC: 10.4 10*3/uL (ref 3.8–10.8)

## 2019-08-17 LAB — COMPLETE METABOLIC PANEL WITH GFR
AG Ratio: 1.6 (calc) (ref 1.0–2.5)
ALT: 14 U/L (ref 6–29)
AST: 14 U/L (ref 10–35)
Albumin: 3.9 g/dL (ref 3.6–5.1)
Alkaline phosphatase (APISO): 117 U/L (ref 37–153)
BUN: 18 mg/dL (ref 7–25)
CO2: 26 mmol/L (ref 20–32)
Calcium: 8.9 mg/dL (ref 8.6–10.4)
Chloride: 104 mmol/L (ref 98–110)
Creat: 0.76 mg/dL (ref 0.60–0.88)
GFR, Est African American: 81 mL/min/{1.73_m2} (ref >=60)
GFR, Est Non African American: 70 mL/min/{1.73_m2} (ref >=60)
Globulin: 2.5 g/dL (calc) (ref 1.9–3.7)
Glucose, Bld: 181 mg/dL — ABNORMAL HIGH (ref 65–99)
Potassium: 4.6 mmol/L (ref 3.5–5.3)
Sodium: 138 mmol/L (ref 135–146)
Total Bilirubin: 0.4 mg/dL (ref 0.2–1.2)
Total Protein: 6.4 g/dL (ref 6.1–8.1)

## 2019-08-17 LAB — HEMOGLOBIN A1C
Hgb A1c MFr Bld: 6.8 % of total Hgb — ABNORMAL HIGH (ref <5.7)
Mean Plasma Glucose: 148 (calc)
eAG (mmol/L): 8.2 (calc)

## 2019-08-22 ENCOUNTER — Encounter: Payer: Self-pay | Admitting: Internal Medicine

## 2019-08-22 ENCOUNTER — Other Ambulatory Visit: Payer: Self-pay

## 2019-08-22 ENCOUNTER — Non-Acute Institutional Stay: Payer: Medicare Other | Admitting: Internal Medicine

## 2019-08-22 VITALS — BP 132/70 | HR 69 | Temp 97.4°F | Ht 60.0 in | Wt 155.6 lb

## 2019-08-22 DIAGNOSIS — F320 Major depressive disorder, single episode, mild: Secondary | ICD-10-CM | POA: Diagnosis not present

## 2019-08-22 DIAGNOSIS — E782 Mixed hyperlipidemia: Secondary | ICD-10-CM | POA: Diagnosis not present

## 2019-08-22 DIAGNOSIS — E1169 Type 2 diabetes mellitus with other specified complication: Secondary | ICD-10-CM

## 2019-08-22 DIAGNOSIS — E039 Hypothyroidism, unspecified: Secondary | ICD-10-CM

## 2019-08-22 DIAGNOSIS — F0390 Unspecified dementia without behavioral disturbance: Secondary | ICD-10-CM | POA: Diagnosis not present

## 2019-08-22 DIAGNOSIS — E669 Obesity, unspecified: Secondary | ICD-10-CM

## 2019-08-22 DIAGNOSIS — E559 Vitamin D deficiency, unspecified: Secondary | ICD-10-CM

## 2019-08-22 MED ORDER — MEMANTINE HCL ER 7 MG PO CP24: 7.0000 mg | ORAL_CAPSULE | Freq: Every day | ORAL | 2 refills | Status: DC

## 2019-08-22 NOTE — Progress Notes (Signed)
Location: [place] of Service:  Clinic (12)  Provider:   Code Status:  Goals of Care:  Advanced Directives 05/08/2019  Does Patient Have a Medical Advance Directive? Yes  Type of Advance Directive [place]  Does patient want to make changes to medical advance directive? No - Patient declined  Copy of [place] in Chart? Yes - validated most recent copy scanned in chart (See row information)  Would patient like information on creating a medical advance directive? -     Chief Complaint  Patient presents with  . Medical Management of Chronic Issues    Patient returns to the clinic for follow up. She does not have an ophthamologist or podiatrist.    HPI: [name] a 86 y.o. female seen today for an acute visit for her Diabetes and her Cognition  Diabetes Mellitus A1C is down to 6.8 CBG in 120-135 Watching her diet. Had one episode that she felt weak And her daughter reminded her to eat her breakfast Dementia Most Likely Alzheimer D/W daughter who wants to try Namenda Had Mechanical fall today Scraped her left Knee Depression Doing well on Zoloft  Continue to be stable in [place] for now Daughter helping her. Still driving  Past Medical History:  Diagnosis Date  . Hearing loss   . History of fracture of clavicle   . Hypothyroidism   . Insomnia   . Tinnitus     Past Surgical History:  Procedure Laterality Date  . CHOLECYSTECTOMY  2007   Dr. Verdene Lennert  . CLAVICLE EXCISION  1986  . MOHS SURGERY     past 10 years chest & legs    Allergies  Allergen Reactions  . Metformin And Related Diarrhea    Outpatient Encounter Medications as of 08/22/2019  Medication Sig  . atorvastatin (LIPITOR) 10 MG tablet Take 1 tablet (10 mg total) by mouth daily.  . Blood Glucose Monitoring Suppl KIT Use to test blood sugar once daily daily. Dx: E11.69  . calcium carbonate (OSCAL) 1500 (600 Ca) MG TABS tablet Take 600 mg of elemental  calcium by mouth daily.  . cetirizine (ZYRTEC) 10 MG tablet Take 1 tablet (10 mg total) by mouth daily.  . Cholecalciferol (VITAMIN D3) 2000 units TABS Take by mouth. Take one Vitamin D daily  . glimepiride (AMARYL) 2 MG tablet TAKE 1 TABLET BY MOUTH DAILY BEFORE BREAKFAST.  Marland Kitchen glucose blood test strip Use to test blood sugar once daily. Dx: E11.69  . Lancets 30G MISC Use to test blood sugar once daily. Dx: E11.69  . Lancets Misc. KIT 1 Device by Does not apply route daily. Use as directed  . levothyroxine (SYNTHROID) 75 MCG tablet TAKE 1 TABLET (75 MCG TOTAL) BY MOUTH DAILY BEFORE BREAKFAST.  Marland Kitchen Omega-3 1400 MG CAPS Take by mouth daily.   . sertraline (ZOLOFT) 25 MG tablet TAKE 1 TABLET BY MOUTH EVERY DAY   No facility-administered encounter medications on file as of 08/22/2019.    Review of Systems:  Review of Systems  Review of Systems  Constitutional: Negative for activity change, appetite change, chills, diaphoresis, fatigue and fever.  HENT: Negative for mouth sores, postnasal drip, rhinorrhea, sinus pain and sore throat.   Respiratory: Negative for apnea, cough, chest tightness, shortness of breath and wheezing.   Cardiovascular: Negative for chest pain, palpitations and leg swelling.  Gastrointestinal: Negative for abdominal distention, abdominal pain, constipation, diarrhea, nausea and vomiting.  Genitourinary: Negative for dysuria and  frequency.  Musculoskeletal: Negative for arthralgias, joint swelling and myalgias.  Skin: Negative for rash.  Neurological: Negative for dizziness, syncope, weakness, light-headedness and numbness.  Psychiatric/Behavioral: Negative for behavioral problems, confusion and sleep disturbance.     Health Maintenance  Topic Date Due  . OPHTHALMOLOGY EXAM  Never done  . URINE MICROALBUMIN  11/24/2018  . TETANUS/TDAP  11/24/2026 (Originally 08/19/1947)  . INFLUENZA VACCINE  09/30/2019  . HEMOGLOBIN A1C  02/15/2020  . FOOT EXAM  07/10/2020  . DEXA  SCAN  Completed  . COVID-19 Vaccine  Completed  . PNA vac Low Risk Adult  Completed    Physical Exam: Vitals:   08/22/19 1306  BP: 132/70  Pulse: 69  Temp: (!) 97.4 F (36.3 C)  SpO2: 96%  Weight: 155 lb 9.6 oz (70.6 kg)  Height: 5' (1.524 m)   Body mass index is 30.39 kg/m. Physical Exam  Constitutional:  Well-developed and well-nourished.  HENT:  Head: Normocephalic.  Mouth/Throat: Oropharynx is clear and moist.  Eyes: Pupils are equal, round, and reactive to light.  Neck: Neck supple.  Cardiovascular: Normal rate and normal heart sounds.  No murmur heard. Pulmonary/Chest: Effort normal and breath sounds normal. No respiratory distress. No wheezes. She has no rales.  Abdominal: Soft. Bowel sounds are normal. No distension. There is no tenderness. There is no rebound.  Musculoskeletal: Mild Edema Bilateral  Lymphadenopathy: none Neurological: Stood with no issues. No Dizziness No Focal Deficits Skin: Skin is warm and dry.  Psychiatric: Normal mood and affect. Behavior is normal. Thought content normal.    Labs reviewed: Basic Metabolic Panel: Recent Labs    10/12/18 0000 04/04/19 0928 08/16/19 0800  NA 142 140 138  K 4.6 3.9 4.6  CL 105 105 104  CO2 _0 GLUCOSE 140* 234* 181*  BUN _1 CREATININE 0.77 0.79 0.76  CALCIUM 9.2 8.7 8.9  TSH 2.47 3.06  --    Liver Function Tests: Recent Labs    10/12/18 0000 04/04/19 0928 08/16/19 0800  AST _2 ALT _3 BILITOT 0.3 0.5 0.4  PROT 6.8 6.4 6.4   No results for input(s): LIPASE, AMYLASE in the last 8760 hours. No results for input(s): AMMONIA in the last 8760 hours. CBC: Recent Labs    10/12/18 0000 08/16/19 0800  WBC 10.2 10.4  NEUTROABS 5,722 6,677  HGB 13.7 12.9  HCT 42.1 39.7  MCV 81.6 79.4*  PLT 316 328   Lipid Panel: Recent Labs    10/12/18 0000 04/04/19 0928  CHOL 136 108  HDL 35* 30*  LDLCALC 72 56  TRIG 195* 138  CHOLHDL 3.9 3.6   Lab Results  Component  Value Date   HGBA1C 6.8 (H) 08/16/2019    Procedures since last visit: No results found.  Assessment/Plan  Diabetes mellitus type 2 in obese ([facility]) A1C improved On Amaryl Reinforced proper diet and Exercise with patient  Foot exam lost visit was normal Needs Ophthalmologist exam   Mixed hyperlipidemia LDL 56 On Statin Depression, major, single episode, mild ([place]) Doing well on Zoloft Continue for now  Dementia without behavioral disturbance, unspecified dementia type ([place]) Will start on Namenda XR 7 mg Daughter will let us know if any Side effects MMSE is 21/30 Drop from 2/30 in Sept of Last year Failed Clock Drawing Has refused Neurology Consult before  Vitamin D deficiency Vit D Low Normal Continue Supplements Hypothyroidism, unspecified type TSH normal in 2/21 Mechanical Fall Just  scraped her knee Reinforced to use her walker   Labs/tests ordered:  * No order type specified * Next appt:  Visit date not found Total time spent in this patient care encounter was  40_  minutes; greater than 50% of the visit spent counseling patient and staff, reviewing records , Labs and coordinating care for problems addressed at this encounter.

## 2019-09-16 ENCOUNTER — Other Ambulatory Visit: Payer: Self-pay | Admitting: Internal Medicine

## 2019-09-30 ENCOUNTER — Other Ambulatory Visit: Payer: Self-pay | Admitting: Internal Medicine

## 2019-10-17 ENCOUNTER — Non-Acute Institutional Stay: Payer: Medicare Other | Admitting: Internal Medicine

## 2019-10-17 ENCOUNTER — Other Ambulatory Visit: Payer: Self-pay

## 2019-10-17 ENCOUNTER — Encounter: Payer: Self-pay | Admitting: Internal Medicine

## 2019-10-17 VITALS — BP 144/80 | HR 82 | Temp 98.0°F | Wt 152.1 lb

## 2019-10-17 DIAGNOSIS — F0390 Unspecified dementia without behavioral disturbance: Secondary | ICD-10-CM

## 2019-10-17 DIAGNOSIS — E1169 Type 2 diabetes mellitus with other specified complication: Secondary | ICD-10-CM

## 2019-10-17 DIAGNOSIS — F320 Major depressive disorder, single episode, mild: Secondary | ICD-10-CM | POA: Diagnosis not present

## 2019-10-17 DIAGNOSIS — W19XXXS Unspecified fall, sequela: Secondary | ICD-10-CM

## 2019-10-17 DIAGNOSIS — E039 Hypothyroidism, unspecified: Secondary | ICD-10-CM

## 2019-10-17 DIAGNOSIS — E559 Vitamin D deficiency, unspecified: Secondary | ICD-10-CM

## 2019-10-17 DIAGNOSIS — E782 Mixed hyperlipidemia: Secondary | ICD-10-CM | POA: Diagnosis not present

## 2019-10-17 DIAGNOSIS — E669 Obesity, unspecified: Secondary | ICD-10-CM

## 2019-10-17 MED ORDER — TETANUS-DIPHTH-ACELL PERTUSSIS 5-2.5-18.5 LF-MCG/0.5 IM SUSP: 0.5000 mL | Freq: Once | INTRAMUSCULAR | 0 refills | Status: AC

## 2019-10-17 NOTE — Progress Notes (Signed)
Location: [place] of Service:  Clinic (12)  Provider:   Code Status:  Goals of Care:  Advanced Directives 05/08/2019  Does Patient Have a Medical Advance Directive? Yes  Type of Advance Directive [place]  Does patient want to make changes to medical advance directive? No - Patient declined  Copy of [place] in Chart? Yes - validated most recent copy scanned in chart (See row information)  Would patient like information on creating a medical advance directive? -     Chief Complaint  Patient presents with  . Medical Management of Chronic Issues    Patient returns to the clinic for follow up.   Marland Kitchen Health Maintenance    Eye exam, urine microalbumin, influenza vaccine    HPI: [name] a 86 y.o. female seen today for an acute visit for Follow up  Diabetes Mellitus Most of the Fasting sugars are between 130-150 Her daughter had wanted to ensure that she checks before lunch which were between 47 and 90. Patient denies any signs of hypoglycemia. Dementia Most likely Alzheimer's Was started on Namenda last visit.  Is tolerating it Fall Patient had another mechanical fall after my last visit.  They think it was because of her shoes.  She has not had any falls in last 6 weeks Depression Doing well on Zoloft  Doing well otherwise daughter is helping.  Patient still drives  Past Medical History:  Diagnosis Date  . Hearing loss   . History of fracture of clavicle   . Hypothyroidism   . Insomnia   . Tinnitus     Past Surgical History:  Procedure Laterality Date  . CHOLECYSTECTOMY  2007   Dr. Verdene Lennert  . CLAVICLE EXCISION  1986  . MOHS SURGERY     past 10 years chest & legs    Allergies  Allergen Reactions  . Metformin And Related Diarrhea    Outpatient Encounter Medications as of 10/17/2019  Medication Sig  . [EXPIRED] Tdap (BOOSTRIX) 5-2.5-18.5 LF-MCG/0.5 injection Inject 0.5 mLs into the muscle once for 1  dose.  . [DISCONTINUED] Tdap (BOOSTRIX) 5-2.5-18.5 LF-MCG/0.5 injection Inject 0.5 mLs into the muscle once.  Marland Kitchen atorvastatin (LIPITOR) 10 MG tablet TAKE 1 TABLET BY MOUTH EVERY DAY  . Blood Glucose Monitoring Suppl KIT Use to test blood sugar once daily daily. Dx: E11.69  . calcium carbonate (OSCAL) 1500 (600 Ca) MG TABS tablet Take 600 mg of elemental calcium by mouth daily.  . cetirizine (ZYRTEC) 10 MG tablet Take 1 tablet (10 mg total) by mouth daily. (Patient taking differently: Take 10 mg by mouth daily as needed for allergies. )  . Cholecalciferol (VITAMIN D3) 2000 units TABS Take by mouth. Take one Vitamin D daily  . glimepiride (AMARYL) 2 MG tablet TAKE 1 TABLET BY MOUTH DAILY BEFORE BREAKFAST.  Marland Kitchen glucose blood test strip Use to test blood sugar once daily. Dx: E11.69  . Lancets 30G MISC Use to test blood sugar once daily. Dx: E11.69  . Lancets Misc. KIT 1 Device by Does not apply route daily. Use as directed  . levothyroxine (SYNTHROID) 75 MCG tablet TAKE 1 TABLET (75 MCG TOTAL) BY MOUTH DAILY BEFORE BREAKFAST.  . memantine (NAMENDA XR) 7 MG CP24 24 hr capsule TAKE 1 CAPSULE (7 MG TOTAL) BY MOUTH DAILY.  Marland Kitchen Omega-3 1400 MG CAPS Take by mouth daily.   . sertraline (ZOLOFT) 25 MG tablet TAKE 1 TABLET BY MOUTH EVERY DAY  No facility-administered encounter medications on file as of 10/17/2019.    Review of Systems:  Review of Systems  Review of Systems  Constitutional: Negative for activity change, appetite change, chills, diaphoresis, fatigue and fever.  HENT: Negative for mouth sores, postnasal drip, rhinorrhea, sinus pain and sore throat.   Respiratory: Negative for apnea, cough, chest tightness, shortness of breath and wheezing.   Cardiovascular: Negative for chest pain, palpitations and leg swelling.  Gastrointestinal: Negative for abdominal distention, abdominal pain, constipation, diarrhea, nausea and vomiting.  Genitourinary: Negative for dysuria and frequency.    Musculoskeletal: Negative for arthralgias, joint swelling and myalgias.  Skin: Negative for rash.  Neurological: Negative for dizziness, syncope, weakness, light-headedness and numbness.  Psychiatric/Behavioral: Negative for behavioral problems, confusion and sleep disturbance.     Health Maintenance  Topic Date Due  . OPHTHALMOLOGY EXAM  Never done  . URINE MICROALBUMIN  11/24/2018  . INFLUENZA VACCINE  09/30/2019  . TETANUS/TDAP  11/24/2026 (Originally 08/19/1947)  . HEMOGLOBIN A1C  02/15/2020  . FOOT EXAM  07/10/2020  . DEXA SCAN  Completed  . COVID-19 Vaccine  Completed  . PNA vac Low Risk Adult  Completed    Physical Exam: Vitals:   10/18/19 1400  BP: (!) 144/80  Pulse: 82  Temp: 98 F (36.7 C)  Weight: 152 lb 1.6 oz (69 kg)   Body mass index is 29.7 kg/m. Physical Exam Constitutional: Oriented to person, place, and time. Well-developed and well-nourished.  HENT:  Head: Normocephalic.  Mouth/Throat: Oropharynx is clear and moist.  Eyes: Pupils are equal, round, and reactive to light.  Neck: Neck supple.  Cardiovascular: Normal rate and normal heart sounds.  No murmur heard. Pulmonary/Chest: Effort normal and breath sounds normal. No respiratory distress. No wheezes. She has no rales.  Abdominal: Soft. Bowel sounds are normal. No distension. There is no tenderness. There is no rebound.  Musculoskeletal: No edema.  Lymphadenopathy: none Neurological: Alert and oriented to person, place, and time.  Skin: Skin is warm and dry.  Psychiatric: Normal mood and affect. Behavior is normal. Thought content normal.   Labs reviewed: Basic Metabolic Panel: Recent Labs    04/04/19 0928 08/16/19 0800  NA 140 138  K 3.9 4.6  CL 105 104  CO2 27 26  GLUCOSE 234* 181*  BUN 16 18  CREATININE 0.79 0.76  CALCIUM 8.7 8.9  TSH 3.06  --    Liver Function Tests: Recent Labs    04/04/19 0928 08/16/19 0800  AST 19 14  ALT 25 14  BILITOT 0.5 0.4  PROT 6.4 6.4   No  results for input(s): LIPASE, AMYLASE in the last 8760 hours. No results for input(s): AMMONIA in the last 8760 hours. CBC: Recent Labs    08/16/19 0800  WBC 10.4  NEUTROABS 6,677  HGB 12.9  HCT 39.7  MCV 79.4*  PLT 328   Lipid Panel: Recent Labs    04/04/19 0928  CHOL 108  HDL 30*  LDLCALC 56  TRIG 138  CHOLHDL 3.6   Lab Results  Component Value Date   HGBA1C 6.8 (H) 08/16/2019    Procedures since last visit: No results found.  Assessment/Plan Diabetes mellitus type 2 in obese [facility]) Doing well on Amaryl Told patient and daughter to let me know if any signs of Hypoglycemia Needs to follow with Opthalmologist Mixed hyperlipidemia LDL 56 On statin Depression, major, single episode, mild ([place]) Doing well on Zoloft Dementia without behavioral disturbance, unspecified dementia type ([place]) Was started on Namenda  Last visit Doing well Will keep the same dose  Vitamin D deficiency On Supplement Hypothyroidism, unspecified type TSH normal in 2/21 Fall, sequela No Falls since changed shoes Will continue to follow    Labs/tests ordered:  * No order type specified * Next appt:  12/13/2019

## 2019-10-18 ENCOUNTER — Encounter: Payer: Self-pay | Admitting: Internal Medicine

## 2019-10-24 ENCOUNTER — Other Ambulatory Visit: Payer: Self-pay | Admitting: Internal Medicine

## 2019-10-24 ENCOUNTER — Encounter: Payer: Self-pay | Admitting: Internal Medicine

## 2019-10-24 DIAGNOSIS — E669 Obesity, unspecified: Secondary | ICD-10-CM

## 2019-12-13 ENCOUNTER — Other Ambulatory Visit: Payer: Self-pay

## 2019-12-13 DIAGNOSIS — E1169 Type 2 diabetes mellitus with other specified complication: Secondary | ICD-10-CM

## 2019-12-13 DIAGNOSIS — E669 Obesity, unspecified: Secondary | ICD-10-CM

## 2019-12-14 LAB — CBC WITH DIFFERENTIAL/PLATELET
Absolute Monocytes: 730 cells/uL (ref 200–950)
Basophils Absolute: 58 cells/uL (ref 0–200)
Basophils Relative: 0.6 %
Eosinophils Absolute: 230 cells/uL (ref 15–500)
Eosinophils Relative: 2.4 %
HCT: 39.7 % (ref 35.0–45.0)
Hemoglobin: 12.6 g/dL (ref 11.7–15.5)
Lymphs Abs: 2736 cells/uL (ref 850–3900)
MCH: 25.5 pg — ABNORMAL LOW (ref 27.0–33.0)
MCHC: 31.7 g/dL — ABNORMAL LOW (ref 32.0–36.0)
MCV: 80.2 fL (ref 80.0–100.0)
MPV: 10.1 fL (ref 7.5–12.5)
Monocytes Relative: 7.6 %
Neutro Abs: 5846 cells/uL (ref 1500–7800)
Neutrophils Relative %: 60.9 %
Platelets: 318 10*3/uL (ref 140–400)
RBC: 4.95 10*6/uL (ref 3.80–5.10)
RDW: 13.5 % (ref 11.0–15.0)
Total Lymphocyte: 28.5 %
WBC: 9.6 10*3/uL (ref 3.8–10.8)

## 2019-12-14 LAB — LIPID PANEL
Cholesterol: 109 mg/dL (ref <200)
HDL: 36 mg/dL — ABNORMAL LOW (ref >=50)
LDL Cholesterol (Calc): 51 mg/dL (calc)
Non-HDL Cholesterol (Calc): 73 mg/dL (calc) (ref <130)
Total CHOL/HDL Ratio: 3 (calc) (ref <5.0)
Triglycerides: 132 mg/dL (ref <150)

## 2019-12-14 LAB — COMPLETE METABOLIC PANEL WITH GFR
AG Ratio: 1.5 (calc) (ref 1.0–2.5)
ALT: 11 U/L (ref 6–29)
AST: 15 U/L (ref 10–35)
Albumin: 3.9 g/dL (ref 3.6–5.1)
Alkaline phosphatase (APISO): 110 U/L (ref 37–153)
BUN: 16 mg/dL (ref 7–25)
CO2: 27 mmol/L (ref 20–32)
Calcium: 9 mg/dL (ref 8.6–10.4)
Chloride: 104 mmol/L (ref 98–110)
Creat: 0.78 mg/dL (ref 0.60–0.88)
GFR, Est African American: 78 mL/min/{1.73_m2} (ref >=60)
GFR, Est Non African American: 67 mL/min/{1.73_m2} (ref >=60)
Globulin: 2.6 g/dL (calc) (ref 1.9–3.7)
Glucose, Bld: 130 mg/dL — ABNORMAL HIGH (ref 65–99)
Potassium: 4.3 mmol/L (ref 3.5–5.3)
Sodium: 141 mmol/L (ref 135–146)
Total Bilirubin: 0.4 mg/dL (ref 0.2–1.2)
Total Protein: 6.5 g/dL (ref 6.1–8.1)

## 2019-12-14 LAB — HEMOGLOBIN A1C
Hgb A1c MFr Bld: 6.8 % of total Hgb — ABNORMAL HIGH (ref <5.7)
Mean Plasma Glucose: 148 (calc)
eAG (mmol/L): 8.2 (calc)

## 2019-12-19 ENCOUNTER — Other Ambulatory Visit: Payer: Self-pay

## 2019-12-19 ENCOUNTER — Non-Acute Institutional Stay: Payer: Medicare Other | Admitting: Internal Medicine

## 2019-12-19 ENCOUNTER — Encounter: Payer: Self-pay | Admitting: Internal Medicine

## 2019-12-19 VITALS — BP 142/66 | HR 101 | Temp 96.5°F | Ht 60.0 in | Wt 155.0 lb

## 2019-12-19 DIAGNOSIS — E1169 Type 2 diabetes mellitus with other specified complication: Secondary | ICD-10-CM | POA: Diagnosis not present

## 2019-12-19 DIAGNOSIS — N3946 Mixed incontinence: Secondary | ICD-10-CM

## 2019-12-19 DIAGNOSIS — F320 Major depressive disorder, single episode, mild: Secondary | ICD-10-CM

## 2019-12-19 DIAGNOSIS — F0390 Unspecified dementia without behavioral disturbance: Secondary | ICD-10-CM | POA: Diagnosis not present

## 2019-12-19 DIAGNOSIS — E669 Obesity, unspecified: Secondary | ICD-10-CM

## 2019-12-19 DIAGNOSIS — M8580 Other specified disorders of bone density and structure, unspecified site: Secondary | ICD-10-CM

## 2019-12-19 DIAGNOSIS — E782 Mixed hyperlipidemia: Secondary | ICD-10-CM | POA: Diagnosis not present

## 2019-12-19 DIAGNOSIS — E039 Hypothyroidism, unspecified: Secondary | ICD-10-CM

## 2019-12-19 MED ORDER — MEMANTINE HCL ER 14 MG PO CP24: 14.0000 mg | ORAL_CAPSULE | Freq: Every day | ORAL | 3 refills | Status: DC

## 2019-12-19 NOTE — Progress Notes (Signed)
Location:  [place] of Service:  Clinic (12)  Provider:   Code Status:  Goals of Care:  Advanced Directives 05/08/2019  Does Patient Have a Medical Advance Directive? Yes  Type of Advance Directive [place]  Does patient want to make changes to medical advance directive? No - Patient declined  Copy of [place] in Chart? Yes - validated most recent copy scanned in chart (See row information)  Would patient like information on creating a medical advance directive? -     Chief Complaint  Patient presents with  . Medical Management of Chronic Issues    Patient returns to the clinic for follow up.   Marland Kitchen Health Maintenance    Eye exam, urine microalbumin    HPI: [name] a 86 y.o. female seen today for medical management of chronic diseases.    Diabetes mellitus Most of her sugars are between 110 and 150 A1c in good control Dementia Started on Namenda.  Did well on it Fall No recent falls does not use any cane or walker still driving Depression Doing well on Zoloft Daughter with her for her appointment. Seems to think that mom is staying stable  Past Medical History:  Diagnosis Date  . Hearing loss   . History of fracture of clavicle   . Hypothyroidism   . Insomnia   . Tinnitus     Past Surgical History:  Procedure Laterality Date  . CHOLECYSTECTOMY  2007   Dr. Verdene Lennert  . CLAVICLE EXCISION  1986  . MOHS SURGERY     past 10 years chest & legs    Allergies  Allergen Reactions  . Metformin And Related Diarrhea    Outpatient Encounter Medications as of 12/19/2019  Medication Sig  . atorvastatin (LIPITOR) 10 MG tablet TAKE 1 TABLET BY MOUTH EVERY DAY  . Blood Glucose Monitoring Suppl KIT Use to test blood sugar once daily daily. Dx: E11.69  . calcium carbonate (OSCAL) 1500 (600 Ca) MG TABS tablet Take 600 mg of elemental calcium by mouth daily.  . cetirizine (ZYRTEC) 10 MG tablet Take 1 tablet (10 mg  total) by mouth daily. (Patient taking differently: Take 10 mg by mouth daily as needed for allergies. )  . Cholecalciferol (VITAMIN D3) 2000 units TABS Take by mouth. Take one Vitamin D daily  . glimepiride (AMARYL) 2 MG tablet TAKE 1 TABLET BY MOUTH EVERY DAY BEFORE BREAKFAST  . glucose blood test strip Use to test blood sugar once daily. Dx: E11.69  . Lancets 30G MISC Use to test blood sugar once daily. Dx: E11.69  . Lancets Misc. KIT 1 Device by Does not apply route daily. Use as directed  . levothyroxine (SYNTHROID) 75 MCG tablet TAKE 1 TABLET (75 MCG TOTAL) BY MOUTH DAILY BEFORE BREAKFAST.  Marland Kitchen Omega-3 1400 MG CAPS Take by mouth daily.   . sertraline (ZOLOFT) 25 MG tablet TAKE 1 TABLET BY MOUTH EVERY DAY  . [DISCONTINUED] memantine (NAMENDA XR) 7 MG CP24 24 hr capsule TAKE 1 CAPSULE (7 MG TOTAL) BY MOUTH DAILY.   No facility-administered encounter medications on file as of 12/19/2019.    Review of Systems:  Review of Systems  Health Maintenance  Topic Date Due  . OPHTHALMOLOGY EXAM  Never done  . URINE MICROALBUMIN  11/24/2018  . TETANUS/TDAP  11/24/2026 (Originally 08/19/1947)  . HEMOGLOBIN A1C  06/12/2020  . FOOT EXAM  07/10/2020  . INFLUENZA VACCINE  Completed  . DEXA SCAN  Completed  . COVID-19 Vaccine  Completed  . PNA vac Low Risk Adult  Completed    Physical Exam: Vitals:   12/19/19 1442  BP: (!) 142/66  Pulse: (!) 101  Temp: (!) 96.5 F (35.8 C)  SpO2: 96%  Weight: 155 lb (70.3 kg)  Height: 5' (1.524 m)   Body mass index is 30.27 kg/m. Physical Exam  Constitutional:  Well-developed and well-nourished.  HENT:  Head: Normocephalic.  Mouth/Throat: Oropharynx is clear and moist.  Eyes: Pupils are equal, round, and reactive to light.  Neck: Neck supple.  Cardiovascular: Normal rate and normal heart sounds.  No murmur heard. Pulmonary/Chest: Effort normal and breath sounds normal. No respiratory distress. No wheezes. She has no rales.  Abdominal: Soft. Bowel  sounds are normal. No distension. There is no tenderness. There is no rebound.  Musculoskeletal: Trace edema bilateral.  Lymphadenopathy: none Neurological: Alert and oriented to person, place, and time.  Gait is stable Skin: Skin is warm and dry.  Psychiatric: Normal mood and affect. Behavior is normal. Thought content normal.    Labs reviewed: Basic Metabolic Panel: Recent Labs    04/04/19 0928 08/16/19 0800 12/13/19 0000  NA 140 138 141  K 3.9 4.6 4.3  CL 105 104 104  CO2 _0 GLUCOSE 234* 181* 130*  BUN _1 CREATININE 0.79 0.76 0.78  CALCIUM 8.7 8.9 9.0  TSH 3.06  --   --    Liver Function Tests: Recent Labs    04/04/19 0928 08/16/19 0800 12/13/19 0000  AST _2 ALT _3 BILITOT 0.5 0.4 0.4  PROT 6.4 6.4 6.5   No results for input(s): LIPASE, AMYLASE in the last 8760 hours. No results for input(s): AMMONIA in the last 8760 hours. CBC: Recent Labs    08/16/19 0800 12/13/19 0000  WBC 10.4 9.6  NEUTROABS 6,677 5,846  HGB 12.9 12.6  HCT 39.7 39.7  MCV 79.4* 80.2  PLT 328 318   Lipid Panel: Recent Labs    04/04/19 0928 12/13/19 0000  CHOL 108 109  HDL 30* 36*  LDLCALC 56 51  TRIG 138 132  CHOLHDL 3.6 3.0   Lab Results  Component Value Date   HGBA1C 6.8 (H) 12/13/2019    Procedures since last visit: No results found.  Assessment/Plan Diabetes mellitus type 2 in obese ([place]) - Plan: Hemoglobin A1c, CBC with Differential/Platelet, COMPLETE METABOLIC PANEL WITH GFR Continue Amaryl Mixed hyperlipidemia On Statin Depression, major, single episode, mild (HCC) Doing well on Zoloft Dementia without behavioral disturbance, unspecified dementia type (HCC) Tolerating Namenda XR Incrase the dose to 14 mg Hypothyroidism, unspecified type TSH normal Osteopenia, unspecified location DeXA in 2020 osteopenia Mixed stress and urge urinary incontinence Discussed various stargtergies Decrase the water intake and Tea and Coffee at  night after 8 pm Consider Myrbetriq if symptoms persists    Labs/tests ordered:  * No order type specified * Next appt:  04/17/2020

## 2019-12-26 ENCOUNTER — Emergency Department (HOSPITAL_COMMUNITY): Payer: Medicare Other

## 2019-12-26 ENCOUNTER — Other Ambulatory Visit: Payer: Self-pay | Admitting: Internal Medicine

## 2019-12-26 ENCOUNTER — Other Ambulatory Visit: Payer: Self-pay

## 2019-12-26 ENCOUNTER — Inpatient Hospital Stay (HOSPITAL_COMMUNITY)
Admission: EM | Admit: 2019-12-26 | Discharge: 2019-12-30 | DRG: 551 | Disposition: A | Discharge status: Skilled Nursing Facility | Payer: Medicare Other | Source: Skilled Nursing Facility | Attending: Internal Medicine | Admitting: Internal Medicine

## 2019-12-26 DIAGNOSIS — F0390 Unspecified dementia without behavioral disturbance: Secondary | ICD-10-CM | POA: Diagnosis present

## 2019-12-26 DIAGNOSIS — Z79899 Other long term (current) drug therapy: Secondary | ICD-10-CM

## 2019-12-26 DIAGNOSIS — S32001S Stable burst fracture of unspecified lumbar vertebra, sequela: Secondary | ICD-10-CM | POA: Diagnosis present

## 2019-12-26 DIAGNOSIS — Y92092 Bedroom in other non-institutional residence as the place of occurrence of the external cause: Secondary | ICD-10-CM

## 2019-12-26 DIAGNOSIS — R269 Unspecified abnormalities of gait and mobility: Secondary | ICD-10-CM | POA: Diagnosis present

## 2019-12-26 DIAGNOSIS — F32A Depression, unspecified: Secondary | ICD-10-CM | POA: Diagnosis present

## 2019-12-26 DIAGNOSIS — S32041A Stable burst fracture of fourth lumbar vertebra, initial encounter for closed fracture: Principal | ICD-10-CM | POA: Diagnosis present

## 2019-12-26 DIAGNOSIS — E119 Type 2 diabetes mellitus without complications: Secondary | ICD-10-CM | POA: Diagnosis present

## 2019-12-26 DIAGNOSIS — R030 Elevated blood-pressure reading, without diagnosis of hypertension: Secondary | ICD-10-CM | POA: Diagnosis present

## 2019-12-26 DIAGNOSIS — Z6830 Body mass index (BMI) 30.0-30.9, adult: Secondary | ICD-10-CM

## 2019-12-26 DIAGNOSIS — R2681 Unsteadiness on feet: Secondary | ICD-10-CM | POA: Diagnosis present

## 2019-12-26 DIAGNOSIS — N83202 Unspecified ovarian cyst, left side: Secondary | ICD-10-CM | POA: Diagnosis present

## 2019-12-26 DIAGNOSIS — M8580 Other specified disorders of bone density and structure, unspecified site: Secondary | ICD-10-CM | POA: Diagnosis present

## 2019-12-26 DIAGNOSIS — J9601 Acute respiratory failure with hypoxia: Secondary | ICD-10-CM | POA: Diagnosis present

## 2019-12-26 DIAGNOSIS — Z20822 Contact with and (suspected) exposure to covid-19: Secondary | ICD-10-CM | POA: Diagnosis present

## 2019-12-26 DIAGNOSIS — S32001A Stable burst fracture of unspecified lumbar vertebra, initial encounter for closed fracture: Secondary | ICD-10-CM

## 2019-12-26 DIAGNOSIS — Z79890 Hormone replacement therapy (postmenopausal): Secondary | ICD-10-CM

## 2019-12-26 DIAGNOSIS — E1169 Type 2 diabetes mellitus with other specified complication: Secondary | ICD-10-CM | POA: Diagnosis present

## 2019-12-26 DIAGNOSIS — Z9049 Acquired absence of other specified parts of digestive tract: Secondary | ICD-10-CM

## 2019-12-26 DIAGNOSIS — J9811 Atelectasis: Secondary | ICD-10-CM | POA: Diagnosis present

## 2019-12-26 DIAGNOSIS — E039 Hypothyroidism, unspecified: Secondary | ICD-10-CM | POA: Diagnosis present

## 2019-12-26 DIAGNOSIS — Z8249 Family history of ischemic heart disease and other diseases of the circulatory system: Secondary | ICD-10-CM

## 2019-12-26 DIAGNOSIS — W19XXXA Unspecified fall, initial encounter: Secondary | ICD-10-CM | POA: Diagnosis present

## 2019-12-26 DIAGNOSIS — F419 Anxiety disorder, unspecified: Secondary | ICD-10-CM | POA: Diagnosis present

## 2019-12-26 DIAGNOSIS — Z7984 Long term (current) use of oral hypoglycemic drugs: Secondary | ICD-10-CM

## 2019-12-26 DIAGNOSIS — E782 Mixed hyperlipidemia: Secondary | ICD-10-CM | POA: Diagnosis present

## 2019-12-26 DIAGNOSIS — E669 Obesity, unspecified: Secondary | ICD-10-CM | POA: Diagnosis present

## 2019-12-26 DIAGNOSIS — N83209 Unspecified ovarian cyst, unspecified side: Secondary | ICD-10-CM

## 2019-12-26 DIAGNOSIS — Z888 Allergy status to other drugs, medicaments and biological substances status: Secondary | ICD-10-CM

## 2019-12-26 LAB — CBC WITH DIFFERENTIAL/PLATELET
Abs Immature Granulocytes: 0.15 10*3/uL — ABNORMAL HIGH (ref 0.00–0.07)
Basophils Absolute: 0 10*3/uL (ref 0.0–0.1)
Basophils Relative: 0 %
Eosinophils Absolute: 0 10*3/uL (ref 0.0–0.5)
Eosinophils Relative: 0 %
HCT: 41.7 % (ref 36.0–46.0)
Hemoglobin: 13.5 g/dL (ref 12.0–15.0)
Immature Granulocytes: 1 %
Lymphocytes Relative: 6 %
Lymphs Abs: 1 10*3/uL (ref 0.7–4.0)
MCH: 25.6 pg — ABNORMAL LOW (ref 26.0–34.0)
MCHC: 32.4 g/dL (ref 30.0–36.0)
MCV: 79.1 fL — ABNORMAL LOW (ref 80.0–100.0)
Monocytes Absolute: 0.9 10*3/uL (ref 0.1–1.0)
Monocytes Relative: 6 %
Neutro Abs: 13.4 10*3/uL — ABNORMAL HIGH (ref 1.7–7.7)
Neutrophils Relative %: 87 %
Platelets: 297 10*3/uL (ref 150–400)
RBC: 5.27 MIL/uL — ABNORMAL HIGH (ref 3.87–5.11)
RDW: 13.9 % (ref 11.5–15.5)
WBC: 15.4 10*3/uL — ABNORMAL HIGH (ref 4.0–10.5)
nRBC: 0 % (ref 0.0–0.2)

## 2019-12-26 LAB — I-STAT CHEM 8, ED
BUN: 16 mg/dL (ref 8–23)
Calcium, Ion: 1.11 mmol/L — ABNORMAL LOW (ref 1.15–1.40)
Chloride: 101 mmol/L (ref 98–111)
Creatinine, Ser: 0.6 mg/dL (ref 0.44–1.00)
Glucose, Bld: 169 mg/dL — ABNORMAL HIGH (ref 70–99)
HCT: 42 % (ref 36.0–46.0)
Hemoglobin: 14.3 g/dL (ref 12.0–15.0)
Potassium: 3.6 mmol/L (ref 3.5–5.1)
Sodium: 139 mmol/L (ref 135–145)
TCO2: 26 mmol/L (ref 22–32)

## 2019-12-26 MED ORDER — MORPHINE SULFATE (PF) 4 MG/ML IV SOLN
4.0000 mg | Freq: Once | INTRAVENOUS | Status: AC
  Administered 2019-12-27: 4 mg via INTRAVENOUS
  Filled 2019-12-26: qty 1

## 2019-12-26 NOTE — ED Provider Notes (Signed)
23:55: Assumed care of patient from Domenic Moras PA-C pending neurosurgery consult and disposition.  Please see prior provider note for full H&P.  Briefly patient is an 86 year old female with a history of diabetes, osteopenia, hypothyroidism, and unsteady gait who presented to the emergency department status post fall with back pain.  She was found to have an incomplete L4 burst fracture on imaging today.     Results for orders placed or performed during the hospital encounter of 12/26/19  CBC with Differential/Platelet  Result Value Ref Range   WBC 15.4 (H) 4.0 - 10.5 K/uL   RBC 5.27 (H) 3.87 - 5.11 MIL/uL   Hemoglobin 13.5 12.0 - 15.0 g/dL   HCT 41.7 36 - 46 %   MCV 79.1 (L) 80.0 - 100.0 fL   MCH 25.6 (L) 26.0 - 34.0 pg   MCHC 32.4 30.0 - 36.0 g/dL   RDW 13.9 11.5 - 15.5 %   Platelets 297 150 - 400 K/uL   nRBC 0.0 0.0 - 0.2 %   Neutrophils Relative % 87 %   Neutro Abs 13.4 (H) 1.7 - 7.7 K/uL   Lymphocytes Relative 6 %   Lymphs Abs 1.0 0.7 - 4.0 K/uL   Monocytes Relative 6 %   Monocytes Absolute 0.9 0.1 - 1.0 K/uL   Eosinophils Relative 0 %   Eosinophils Absolute 0.0 0.0 - 0.5 K/uL   Basophils Relative 0 %   Basophils Absolute 0.0 0.0 - 0.1 K/uL   Immature Granulocytes 1 %   Abs Immature Granulocytes 0.15 (H) 0.00 - 0.07 K/uL  I-stat chem 8, ED (not at [facility] or [facility])  Result Value Ref Range   Sodium 139 135 - 145 mmol/L   Potassium 3.6 3.5 - 5.1 mmol/L   Chloride 101 98 - 111 mmol/L   BUN 16 8 - 23 mg/dL   Creatinine, Ser 0.60 0.44 - 1.00 mg/dL   Glucose, Bld 169 (H) 70 - 99 mg/dL   Calcium, Ion 1.11 (L) 1.15 - 1.40 mmol/L   TCO2 26 22 - 32 mmol/L   Hemoglobin 14.3 12.0 - 15.0 g/dL   HCT 42.0 36 - 46 %   DG Lumbar Spine Complete  Result Date: 12/26/2019 CLINICAL DATA:  Unwitnessed fall, low back pain EXAM: LUMBAR SPINE - COMPLETE 4+ VIEW COMPARISON:  None. FINDINGS: Five lumbar vertebrae. The osseous structures appear diffusely demineralized which may limit detection of  small or nondisplaced fractures. Age-indeterminate deformity involving the superior endplate L4 which could be related to a broad-based Schmorl's node formation no acute fracture is not excluded particularly given some cortical step-off anteriorly. No other acute fracture or height loss is evident. Multilevel degenerative changes are present in the imaged portions of the spine. Lower thoracic flowing anterior osteophytosis, could suggest diffuse idiopathic skeletal hyperostosis (DISH). Interspinous arthrosis and facet arthropathy noted throughout the lumbar levels. Atherosclerotic calcification of the abdominal aorta. Remaining soft tissues are unremarkable. Calcified pelvic phleboliths. IMPRESSION: 1. Age-indeterminate deformity involving the superior endplate L4 which could be related to acute injury or a broad-based Schmorl's node formation. Correlate for point tenderness and consider cross-sectional imaging which will be more sensitive and specific for subtle injury in this patient with diffuse bony demineralization and advanced degenerative change. 2. Multilevel features of discogenic, facet degenerative and interspinous arthrosis as above. 3.  Aortic Atherosclerosis (ICD10-I70.0). Electronically Signed   By: Lovena Le M.D.   On: 12/26/2019 21:06   CT Lumbar Spine Wo Contrast  Result Date: 12/26/2019 CLINICAL DATA:  Acute  low back pain EXAM: CT LUMBAR SPINE WITHOUT CONTRAST TECHNIQUE: Multidetector CT imaging of the lumbar spine was performed without intravenous contrast administration. Multiplanar CT image reconstructions were also generated. COMPARISON:  None. FINDINGS: Segmentation: Standard Alignment: No static subluxation. Vertebrae: Acute incomplete burst fracture of L4 involving the anterior and posterior walls and the superior endplate. Minimal height loss and retropulsion. Chronic height loss of L5. Diffuse osteopenia. Paraspinal and other soft tissues: Calcific aortic atherosclerosis.  Incompletely visualized cystic lesions of the upper pelvis. Disc levels: No spinal canal stenosis.  No neural impingement. IMPRESSION: 1. Acute incomplete burst fracture of L4 involving the anterior and posterior walls and the superior endplate. Minimal height loss and retropulsion. 2. Cystic masses of the upper pelvis, incompletely visualized. Pelvic ultrasound or CT abdomen/pelvis recommended. 3. No spinal canal stenosis. 4. Chronic height loss of L5. Aortic Atherosclerosis (ICD10-I70.0). Electronically Signed   By: Ulyses Jarred M.D.   On: 12/26/2019 23:22   Procedures   Patient unable to sit up in bed without significant pain, defer trial of ambulation, will give analgesics and reassess.  00:02: CONSULT: Discussed with neurosurgery NP Margo Aye, will review imaging and call back.  00:27: CONSULT: Rediscussed with neurosurgical team, recommend clamshell back brace, follow-up in office in 2 weeks for repeat imaging.  We discussed the patient is having significant pain and trouble walking and may require admission for further pain management, neurosurgery team aware.  01:00: RE-EVAL: Following morphine I reassessed the patient, she is still having significant pain with movement, she is pending brace at this time, and is not significantly in pain at rest, will hold off on analgesics until brace placement and potential reattempt of ambulation.  02:55: Tech @ bedside for brace placement, fentanyl ordered to help with discomfort.   Following brace placement attempted to have patient sit up in the bed for ambulation, she again cried out in pain and also became quite nauseated. She was placed back in bed, zofran was ordered, & consult was placed to hospitalist service.   Discussed with hospitalist Dr. Alcario Drought, accepts admission, requesting neurosurgery see patient during admission- I have messaged NP Meyran w/ neurosurgery to make aware.   Patient & her daughter are in agreement.        Amaryllis Dyke, PA-C 87/86/76 [id number]    Delora Fuel, MD 47/09/62 [phone]

## 2019-12-26 NOTE — ED Triage Notes (Signed)
Patient BIB [facility] because patient was found on the floor at [facility]. EMS reports that patient lowered onto floor because she was having lower back pain 8/10. Patient denies.EMS reports patient is A&O x3

## 2019-12-26 NOTE — ED Notes (Signed)
[name] not answering questions appropriately. [name] telling PA that there is nothing wrong with her back and that she has no back pain and should  not be here in hospital. Patient has a delayed reaction when answering questions. [name] A&O x 3

## 2019-12-26 NOTE — ED Provider Notes (Signed)
[facility] DEPT Provider Note   CSN: [id number] Arrival date & time: 12/26/19  1939     History Chief Complaint  Patient presents with  . Back Pain    Nalaya Wojdyla is a 86 y.o. female.  The history is provided by the patient, the EMS personnel and medical records. No language interpreter was used.  Back Pain    86 year old female with history of diabetes, osteopenia, unsteady gait, hypothyroidism brought here via EMS from an assisted living facility when she was found to be on the ground by daughter.  It is difficult to obtain history from patient as she felt she should not be in the ED.  [name] without complaint.  She mention daughter found her laying on the floor and was concerned that she may have hurt her back.  Patient states she is not having any back pain however in the EMS note, patient was documented to complain of 8 out of 10 lower back pain.  Patient denies any fall, denies any head injury, no complaints of headache neck pain chest pain trouble breathing abdominal pain back pain or pain to extremities.  She denies any numbness or weakness.  She denies any dysuria.  She is not on blood thinner medication.  She denies any drug or alcohol use.  Patient was not forthcoming in regards to why she was on the floor.  Past Medical History:  Diagnosis Date  . Hearing loss   . History of fracture of clavicle   . Hypothyroidism   . Insomnia   . Tinnitus     Patient Active Problem List   Diagnosis Date Noted  . Malaise and fatigue 04/03/2019  . Confusion 04/03/2019  . Tachycardia 04/03/2019  . Osteopenia 06/14/2018  . Diabetes mellitus type 2 in obese ([place]) 11/09/2017  . Prediabetes 01/19/2017  . Obesity (BMI 30-39.9) 01/19/2017  . Mixed hyperlipidemia 06/29/2016  . Unstable gait 04/27/2016  . Hearing loss   . Hypothyroidism   . Insomnia     Past Surgical History:  Procedure Laterality Date  . CHOLECYSTECTOMY  2007   Dr. Verdene Lennert  .  CLAVICLE EXCISION  1986  . MOHS SURGERY     past 10 years chest & legs     OB History   No obstetric history on file.     Family History  Problem Relation Age of Onset  . Heart disease Mother   . Heart disease Father     Social History   Tobacco Use  . Smoking status: Never Smoker  . Smokeless tobacco: Never Used  Vaping Use  . Vaping Use: Never used  Substance Use Topics  . Alcohol use: Yes    Comment: 4 per week  . Drug use: No    Home Medications Prior to Admission medications   Medication Sig Start Date End Date Taking? Authorizing Provider  atorvastatin (LIPITOR) 10 MG tablet TAKE 1 TABLET BY MOUTH EVERY DAY 10/01/19   [name], MD  Blood Glucose Monitoring Suppl KIT Use to test blood sugar once daily daily. Dx: E11.69 05/25/19   Mast, Man X, NP  calcium carbonate (OSCAL) 1500 (600 Ca) MG TABS tablet Take 600 mg of elemental calcium by mouth daily.    [provider]  cetirizine (ZYRTEC) 10 MG tablet Take 1 tablet (10 mg total) by mouth daily. Patient taking differently: Take 10 mg by mouth daily as needed for allergies.  10/09/18   Ngetich, Nelda Bucks, NP  Cholecalciferol (VITAMIN  D3) 2000 units TABS Take by mouth. Take one Vitamin D daily    [provider]  glimepiride (AMARYL) 2 MG tablet TAKE 1 TABLET BY MOUTH EVERY DAY BEFORE BREAKFAST 10/24/19   [name], MD  glucose blood test strip Use to test blood sugar once daily. Dx: E11.69 05/25/19   Mast, Man X, NP  Lancets 30G MISC Use to test blood sugar once daily. Dx: E11.69 05/25/19   Mast, Man X, NP  Lancets Misc. KIT 1 Device by Does not apply route daily. Use as directed 05/24/19   [name], MD  levothyroxine (SYNTHROID) 75 MCG tablet TAKE 1 TABLET (75 MCG TOTAL) BY MOUTH DAILY BEFORE BREAKFAST. 08/14/19   [name], MD  memantine (NAMENDA XR) 14 MG CP24 24 hr capsule Take 1 capsule (14 mg total) by mouth daily. 12/19/19   [name], MD  Omega-3 1400 MG CAPS Take by mouth  daily.     [provider]  sertraline (ZOLOFT) 25 MG tablet TAKE 1 TABLET BY MOUTH EVERY DAY 12/26/19   [name], MD    Allergies    Metformin and related  Review of Systems   Review of Systems  Musculoskeletal: Positive for back pain.  All other systems reviewed and are negative.   Physical Exam Updated Vital Signs BP (!) 166/81 (BP Location: Right Arm)   Pulse 89   Temp 98.1 F (36.7 C) (Oral)   Resp 20   Ht 5' (1.524 m)   Wt 68 kg   SpO2 96%   BMI 29.29 kg/m   Physical Exam Vitals and nursing note reviewed.  Constitutional:      General: She is not in acute distress.    Appearance: She is well-developed.  HENT:     Head: Atraumatic.  Eyes:     Conjunctiva/sclera: Conjunctivae normal.  Neck:     Comments: C-collar in place, c-collar removed and patient without any significant midline cervical spine tenderness on exam.  Neck with full range of motion. Cardiovascular:     Rate and Rhythm: Normal rate and regular rhythm.     Pulses: Normal pulses.     Heart sounds: Normal heart sounds.  Pulmonary:     Effort: Pulmonary effort is normal.     Breath sounds: Normal breath sounds.  Chest:     Chest wall: No tenderness.  Abdominal:     Palpations: Abdomen is soft.     Tenderness: There is no abdominal tenderness.  Musculoskeletal:     Cervical back: Normal range of motion and neck supple.     Comments: 5 out of 5 strength to bilateral upper lower extremity, 5 out of 5 strength to right lower extremity, 4 out of 5 strength to left lower extremity  Skin:    Findings: No rash.  Neurological:     Mental Status: She is alert and oriented to person, place, and time.  Psychiatric:        Mood and Affect: Mood normal.     ED Results / Procedures / Treatments   Labs (all labs ordered are listed, but only abnormal results are displayed) Labs Reviewed  CBC WITH DIFFERENTIAL/PLATELET - Abnormal; Notable for the following components:      Result Value     WBC 15.4 (*)    RBC 5.27 (*)    MCV 79.1 (*)    MCH 25.6 (*)    Neutro Abs 13.4 (*)    Abs Immature Granulocytes 0.15 (*)  All other components within normal limits  I-STAT CHEM 8, ED - Abnormal; Notable for the following components:   Glucose, Bld 169 (*)    Calcium, Ion 1.11 (*)    All other components within normal limits  RESPIRATORY PANEL BY RT PCR (FLU A&B, COVID)  URINALYSIS, ROUTINE W REFLEX MICROSCOPIC    EKG EKG Interpretation  Date/Time:  Wednesday December 26 2019 21:06:10 EDT Ventricular Rate:  90 PR Interval:    QRS Duration: 74 QT Interval:  362 QTC Calculation: 443 R Axis:   5 Text Interpretation: Sinus rhythm Normal ECG No old tracing to compare Confirmed by Delora Fuel ([id number]) on 12/26/2019 11:31:58 PM   Radiology DG Lumbar Spine Complete  Result Date: 12/26/2019 CLINICAL DATA:  Unwitnessed fall, low back pain EXAM: LUMBAR SPINE - COMPLETE 4+ VIEW COMPARISON:  None. FINDINGS: Five lumbar vertebrae. The osseous structures appear diffusely demineralized which may limit detection of small or nondisplaced fractures. Age-indeterminate deformity involving the superior endplate L4 which could be related to a broad-based Schmorl's node formation no acute fracture is not excluded particularly given some cortical step-off anteriorly. No other acute fracture or height loss is evident. Multilevel degenerative changes are present in the imaged portions of the spine. Lower thoracic flowing anterior osteophytosis, could suggest diffuse idiopathic skeletal hyperostosis (DISH). Interspinous arthrosis and facet arthropathy noted throughout the lumbar levels. Atherosclerotic calcification of the abdominal aorta. Remaining soft tissues are unremarkable. Calcified pelvic phleboliths. IMPRESSION: 1. Age-indeterminate deformity involving the superior endplate L4 which could be related to acute injury or a broad-based Schmorl's node formation. Correlate for point tenderness and  consider cross-sectional imaging which will be more sensitive and specific for subtle injury in this patient with diffuse bony demineralization and advanced degenerative change. 2. Multilevel features of discogenic, facet degenerative and interspinous arthrosis as above. 3.  Aortic Atherosclerosis (ICD10-I70.0). Electronically Signed   By: Lovena Le M.D.   On: 12/26/2019 21:06   CT Lumbar Spine Wo Contrast  Result Date: 12/26/2019 CLINICAL DATA:  Acute low back pain EXAM: CT LUMBAR SPINE WITHOUT CONTRAST TECHNIQUE: Multidetector CT imaging of the lumbar spine was performed without intravenous contrast administration. Multiplanar CT image reconstructions were also generated. COMPARISON:  None. FINDINGS: Segmentation: Standard Alignment: No static subluxation. Vertebrae: Acute incomplete burst fracture of L4 involving the anterior and posterior walls and the superior endplate. Minimal height loss and retropulsion. Chronic height loss of L5. Diffuse osteopenia. Paraspinal and other soft tissues: Calcific aortic atherosclerosis. Incompletely visualized cystic lesions of the upper pelvis. Disc levels: No spinal canal stenosis.  No neural impingement. IMPRESSION: 1. Acute incomplete burst fracture of L4 involving the anterior and posterior walls and the superior endplate. Minimal height loss and retropulsion. 2. Cystic masses of the upper pelvis, incompletely visualized. Pelvic ultrasound or CT abdomen/pelvis recommended. 3. No spinal canal stenosis. 4. Chronic height loss of L5. Aortic Atherosclerosis (ICD10-I70.0). Electronically Signed   By: Ulyses Jarred M.D.   On: 12/26/2019 23:22    Procedures Procedures (including critical care time)  Medications Ordered in ED Medications  morphine 4 MG/ML injection 4 mg (has no administration in time range)    ED Course  I have reviewed the triage vital signs and the nursing notes.  Pertinent labs & imaging results that were available during my care of the  patient were reviewed by me and considered in my medical decision making (see chart for details).    MDM Rules/Calculators/A&P  BP (!) 149/80 (BP Location: Left Arm)   Pulse 95   Temp 98.1 F (36.7 C) (Oral)   Resp 20   Ht 5' (1.524 m)   Wt 68 kg   SpO2 96%   BMI 29.29 kg/m   Final Clinical Impression(s) / ED Diagnoses Final diagnoses:  None    Rx / DC Orders ED Discharge Orders    None     8:30 PM This is an elderly female brought here when she was found laying on the floor in apartment by her daughter.  Concern was that she may have had a fall.  Patient denies any recent injury of falling.  She denies having any active pain or any other complaint.  She is mentating appropriately, is without any complaints at this time.  Exam revealed only decrease strength to her left lower extremity compared to the right but no reproducible low back pain.  She is neurovascular intact.  Given her age, will obtain EKG, basic labs, UA, and x-ray of her lumbar spine.  11:28 PM Elevated white count of 15.4.  Initial L-spine x-ray showed an age-indeterminate deformity involving the superior endplate of L4 which could be related to acute injury or a broad-based Schmorl node formation.  Patient does have mild tenderness to palpation of the affected area.  After some convincing, patient agrees to have a CT scan of her lumbar spine for further evaluation.  CT scan obtained showing acute incomplete burst fracture of the L4 involving the anterior and posterior walls in the superior endplate with minimal height loss and retrofusion but no spinal canal stenosis.  Pt sign out to oncoming provider who will consult neurosurgeon for recommendation.  Pt is having a difficult time even getting on the bed pan due to pain. Pain medication ordered.  UA have not been obtained yet.  Will order covid screening test as well.  Anticipate admission for pain control and PT/OT and placement. Care  discussed with Dr. Zenia Resides.    Domenic Moras, PA-C 12/27/19 0001    Lacretia Leigh, MD 12/27/19 1719

## 2019-12-27 ENCOUNTER — Encounter (HOSPITAL_COMMUNITY): Payer: Self-pay | Admitting: Internal Medicine

## 2019-12-27 ENCOUNTER — Observation Stay (HOSPITAL_COMMUNITY): Payer: Medicare Other

## 2019-12-27 DIAGNOSIS — R4789 Other speech disturbances: Secondary | ICD-10-CM | POA: Diagnosis not present

## 2019-12-27 DIAGNOSIS — Z79899 Other long term (current) drug therapy: Secondary | ICD-10-CM | POA: Diagnosis not present

## 2019-12-27 DIAGNOSIS — E669 Obesity, unspecified: Secondary | ICD-10-CM

## 2019-12-27 DIAGNOSIS — M5459 Other low back pain: Secondary | ICD-10-CM | POA: Diagnosis not present

## 2019-12-27 DIAGNOSIS — S32001S Stable burst fracture of unspecified lumbar vertebra, sequela: Secondary | ICD-10-CM | POA: Diagnosis present

## 2019-12-27 DIAGNOSIS — E038 Other specified hypothyroidism: Secondary | ICD-10-CM

## 2019-12-27 DIAGNOSIS — J188 Other pneumonia, unspecified organism: Secondary | ICD-10-CM | POA: Diagnosis not present

## 2019-12-27 DIAGNOSIS — F418 Other specified anxiety disorders: Secondary | ICD-10-CM | POA: Diagnosis not present

## 2019-12-27 DIAGNOSIS — Z9181 History of falling: Secondary | ICD-10-CM | POA: Diagnosis not present

## 2019-12-27 DIAGNOSIS — M6281 Muscle weakness (generalized): Secondary | ICD-10-CM | POA: Diagnosis not present

## 2019-12-27 DIAGNOSIS — R030 Elevated blood-pressure reading, without diagnosis of hypertension: Secondary | ICD-10-CM | POA: Diagnosis present

## 2019-12-27 DIAGNOSIS — I10 Essential (primary) hypertension: Secondary | ICD-10-CM | POA: Diagnosis not present

## 2019-12-27 DIAGNOSIS — E1169 Type 2 diabetes mellitus with other specified complication: Secondary | ICD-10-CM

## 2019-12-27 DIAGNOSIS — Z4789 Encounter for other orthopedic aftercare: Secondary | ICD-10-CM | POA: Diagnosis not present

## 2019-12-27 DIAGNOSIS — F32A Depression, unspecified: Secondary | ICD-10-CM | POA: Diagnosis present

## 2019-12-27 DIAGNOSIS — E782 Mixed hyperlipidemia: Secondary | ICD-10-CM | POA: Diagnosis present

## 2019-12-27 DIAGNOSIS — E039 Hypothyroidism, unspecified: Secondary | ICD-10-CM | POA: Diagnosis not present

## 2019-12-27 DIAGNOSIS — R1310 Dysphagia, unspecified: Secondary | ICD-10-CM | POA: Diagnosis not present

## 2019-12-27 DIAGNOSIS — F0390 Unspecified dementia without behavioral disturbance: Secondary | ICD-10-CM | POA: Diagnosis not present

## 2019-12-27 DIAGNOSIS — R269 Unspecified abnormalities of gait and mobility: Secondary | ICD-10-CM | POA: Diagnosis present

## 2019-12-27 DIAGNOSIS — R2681 Unsteadiness on feet: Secondary | ICD-10-CM | POA: Diagnosis not present

## 2019-12-27 DIAGNOSIS — S32001A Stable burst fracture of unspecified lumbar vertebra, initial encounter for closed fracture: Secondary | ICD-10-CM | POA: Diagnosis not present

## 2019-12-27 DIAGNOSIS — S72002D Fracture of unspecified part of neck of left femur, subsequent encounter for closed fracture with routine healing: Secondary | ICD-10-CM | POA: Diagnosis not present

## 2019-12-27 DIAGNOSIS — Z7984 Long term (current) use of oral hypoglycemic drugs: Secondary | ICD-10-CM | POA: Diagnosis not present

## 2019-12-27 DIAGNOSIS — J9601 Acute respiratory failure with hypoxia: Secondary | ICD-10-CM | POA: Diagnosis present

## 2019-12-27 DIAGNOSIS — W19XXXA Unspecified fall, initial encounter: Secondary | ICD-10-CM | POA: Diagnosis present

## 2019-12-27 DIAGNOSIS — Z20822 Contact with and (suspected) exposure to covid-19: Secondary | ICD-10-CM | POA: Diagnosis present

## 2019-12-27 DIAGNOSIS — Z79890 Hormone replacement therapy (postmenopausal): Secondary | ICD-10-CM | POA: Diagnosis not present

## 2019-12-27 DIAGNOSIS — Z888 Allergy status to other drugs, medicaments and biological substances status: Secondary | ICD-10-CM | POA: Diagnosis not present

## 2019-12-27 DIAGNOSIS — S32001D Stable burst fracture of unspecified lumbar vertebra, subsequent encounter for fracture with routine healing: Secondary | ICD-10-CM | POA: Diagnosis not present

## 2019-12-27 DIAGNOSIS — Y92092 Bedroom in other non-institutional residence as the place of occurrence of the external cause: Secondary | ICD-10-CM | POA: Diagnosis not present

## 2019-12-27 DIAGNOSIS — Z6830 Body mass index (BMI) 30.0-30.9, adult: Secondary | ICD-10-CM | POA: Diagnosis not present

## 2019-12-27 DIAGNOSIS — M8580 Other specified disorders of bone density and structure, unspecified site: Secondary | ICD-10-CM | POA: Diagnosis present

## 2019-12-27 DIAGNOSIS — G4700 Insomnia, unspecified: Secondary | ICD-10-CM | POA: Diagnosis not present

## 2019-12-27 DIAGNOSIS — S32041A Stable burst fracture of fourth lumbar vertebra, initial encounter for closed fracture: Secondary | ICD-10-CM | POA: Diagnosis present

## 2019-12-27 DIAGNOSIS — R1319 Other dysphagia: Secondary | ICD-10-CM | POA: Diagnosis not present

## 2019-12-27 DIAGNOSIS — F419 Anxiety disorder, unspecified: Secondary | ICD-10-CM | POA: Diagnosis present

## 2019-12-27 DIAGNOSIS — F0280 Dementia in other diseases classified elsewhere without behavioral disturbance: Secondary | ICD-10-CM | POA: Diagnosis not present

## 2019-12-27 DIAGNOSIS — Z8249 Family history of ischemic heart disease and other diseases of the circulatory system: Secondary | ICD-10-CM | POA: Diagnosis not present

## 2019-12-27 DIAGNOSIS — Z9049 Acquired absence of other specified parts of digestive tract: Secondary | ICD-10-CM | POA: Diagnosis not present

## 2019-12-27 DIAGNOSIS — N83202 Unspecified ovarian cyst, left side: Secondary | ICD-10-CM | POA: Diagnosis not present

## 2019-12-27 DIAGNOSIS — J9811 Atelectasis: Secondary | ICD-10-CM | POA: Diagnosis present

## 2019-12-27 DIAGNOSIS — E119 Type 2 diabetes mellitus without complications: Secondary | ICD-10-CM | POA: Diagnosis not present

## 2019-12-27 HISTORY — DX: Stable burst fracture of unspecified lumbar vertebra, sequela: S32.001S

## 2019-12-27 LAB — GLUCOSE, CAPILLARY
Glucose-Capillary: 154 mg/dL — ABNORMAL HIGH (ref 70–99)
Glucose-Capillary: 154 mg/dL — ABNORMAL HIGH (ref 70–99)
Glucose-Capillary: 102 mg/dL — ABNORMAL HIGH (ref 70–99)
Glucose-Capillary: 100 mg/dL — ABNORMAL HIGH (ref 70–99)

## 2019-12-27 LAB — CBC
HCT: 41.3 % (ref 36.0–46.0)
Hemoglobin: 13.2 g/dL (ref 12.0–15.0)
MCH: 25.8 pg — ABNORMAL LOW (ref 26.0–34.0)
MCHC: 32 g/dL (ref 30.0–36.0)
MCV: 80.8 fL (ref 80.0–100.0)
Platelets: 256 10*3/uL (ref 150–400)
RBC: 5.11 MIL/uL (ref 3.87–5.11)
RDW: 14.2 % (ref 11.5–15.5)
WBC: 14.1 10*3/uL — ABNORMAL HIGH (ref 4.0–10.5)
nRBC: 0 % (ref 0.0–0.2)

## 2019-12-27 LAB — BASIC METABOLIC PANEL
Anion gap: 11 (ref 5–15)
BUN: 17 mg/dL (ref 8–23)
CO2: 22 mmol/L (ref 22–32)
Calcium: 8.8 mg/dL — ABNORMAL LOW (ref 8.9–10.3)
Chloride: 103 mmol/L (ref 98–111)
Creatinine, Ser: 0.74 mg/dL (ref 0.44–1.00)
GFR, Estimated: >60 mL/min (ref >60)
Glucose, Bld: 127 mg/dL — ABNORMAL HIGH (ref 70–99)
Potassium: 4.5 mmol/L (ref 3.5–5.1)
Sodium: 136 mmol/L (ref 135–145)

## 2019-12-27 LAB — RESPIRATORY PANEL BY RT PCR (FLU A&B, COVID)
Influenza A by PCR: NEGATIVE
Influenza B by PCR: NEGATIVE
SARS Coronavirus 2 by RT PCR: NEGATIVE

## 2019-12-27 MED ORDER — ONDANSETRON HCL 4 MG/2ML IJ SOLN: 4.0000 mg | Freq: Four times a day (QID) | INTRAMUSCULAR | Status: DC | PRN

## 2019-12-27 MED ORDER — SERTRALINE HCL 25 MG PO TABS
25.0000 mg | ORAL_TABLET | Freq: Every day | ORAL | Status: DC
  Administered 2019-12-27 – 2019-12-30 (×4): 25 mg via ORAL
  Filled 2019-12-27 (×4): qty 1

## 2019-12-27 MED ORDER — VITAMIN D3 25 MCG (1000 UNIT) PO TABS
1000.0000 [IU] | ORAL_TABLET | Freq: Every day | ORAL | Status: DC
  Administered 2019-12-27 – 2019-12-30 (×4): 1000 [IU] via ORAL
  Filled 2019-12-27 (×8): qty 1

## 2019-12-27 MED ORDER — ONDANSETRON HCL 4 MG/2ML IJ SOLN
4.0000 mg | Freq: Once | INTRAMUSCULAR | Status: AC
  Administered 2019-12-27: 4 mg via INTRAVENOUS
  Filled 2019-12-27: qty 2

## 2019-12-27 MED ORDER — ENOXAPARIN SODIUM 40 MG/0.4ML ~~LOC~~ SOLN
40.0000 mg | SUBCUTANEOUS | Status: DC
  Administered 2019-12-27 – 2019-12-30 (×4): 40 mg via SUBCUTANEOUS
  Filled 2019-12-27 (×4): qty 0.4

## 2019-12-27 MED ORDER — IOHEXOL 300 MG/ML  SOLN
100.0000 mL | Freq: Once | INTRAMUSCULAR | Status: AC | PRN
  Administered 2019-12-27: 100 mL via INTRAVENOUS

## 2019-12-27 MED ORDER — OMEGA-3-ACID ETHYL ESTERS 1 G PO CAPS
1.0000 g | ORAL_CAPSULE | Freq: Every day | ORAL | Status: DC
  Administered 2019-12-27 – 2019-12-30 (×4): 1 g via ORAL
  Filled 2019-12-27 (×4): qty 1

## 2019-12-27 MED ORDER — MORPHINE SULFATE (PF) 2 MG/ML IV SOLN
2.0000 mg | INTRAVENOUS | Status: DC | PRN
  Administered 2019-12-27 (×4): 2 mg via INTRAVENOUS
  Filled 2019-12-27 (×4): qty 1

## 2019-12-27 MED ORDER — ACETAMINOPHEN 650 MG RE SUPP: 650.0000 mg | Freq: Four times a day (QID) | RECTAL | Status: DC | PRN

## 2019-12-27 MED ORDER — ACETAMINOPHEN 325 MG PO TABS
650.0000 mg | ORAL_TABLET | Freq: Four times a day (QID) | ORAL | Status: DC | PRN
  Administered 2019-12-28 – 2019-12-30 (×5): 650 mg via ORAL
  Filled 2019-12-27 (×5): qty 2

## 2019-12-27 MED ORDER — CALCIUM CARBONATE 1250 (500 CA) MG PO TABS
1500.0000 mg | ORAL_TABLET | Freq: Every day | ORAL | Status: DC
  Administered 2019-12-27 – 2019-12-30 (×4): 1500 mg via ORAL
  Filled 2019-12-27 (×4): qty 2

## 2019-12-27 MED ORDER — ATORVASTATIN CALCIUM 10 MG PO TABS
10.0000 mg | ORAL_TABLET | Freq: Every day | ORAL | Status: DC
  Administered 2019-12-27 – 2019-12-30 (×4): 10 mg via ORAL
  Filled 2019-12-27 (×5): qty 1

## 2019-12-27 MED ORDER — ONDANSETRON HCL 4 MG PO TABS: 4.0000 mg | ORAL_TABLET | Freq: Four times a day (QID) | ORAL | Status: DC | PRN

## 2019-12-27 MED ORDER — MEMANTINE HCL ER 14 MG PO CP24
14.0000 mg | ORAL_CAPSULE | Freq: Every day | ORAL | Status: DC
  Administered 2019-12-27 – 2019-12-29 (×3): 14 mg via ORAL
  Filled 2019-12-27 (×3): qty 1

## 2019-12-27 MED ORDER — LEVOTHYROXINE SODIUM 75 MCG PO TABS
75.0000 ug | ORAL_TABLET | Freq: Every day | ORAL | Status: DC
  Administered 2019-12-27 – 2019-12-30 (×4): 75 ug via ORAL
  Filled 2019-12-27 (×4): qty 1

## 2019-12-27 MED ORDER — SODIUM CHLORIDE (PF) 0.9 % IJ SOLN
INTRAMUSCULAR | Status: AC
  Filled 2019-12-27: qty 50

## 2019-12-27 MED ORDER — GLIMEPIRIDE 2 MG PO TABS
2.0000 mg | ORAL_TABLET | Freq: Every day | ORAL | Status: DC
  Administered 2019-12-27 – 2019-12-30 (×4): 2 mg via ORAL
  Filled 2019-12-27 (×4): qty 1

## 2019-12-27 MED ORDER — FENTANYL CITRATE (PF) 100 MCG/2ML IJ SOLN
50.0000 ug | Freq: Once | INTRAMUSCULAR | Status: AC
  Administered 2019-12-27: 50 ug via INTRAVENOUS
  Filled 2019-12-27: qty 2

## 2019-12-27 MED ORDER — OMEGA-3 1400 MG PO CAPS: 1.0000 | ORAL_CAPSULE | Freq: Every day | ORAL | Status: DC

## 2019-12-27 NOTE — ED Notes (Signed)
Patient yelled in pain when she was rolled from side to side in bed. Pt was not willing to sit on side of bed to ambulate. PA notified

## 2019-12-27 NOTE — TOC Initial Note (Signed)
Transition of Care [facility]) - Initial/Assessment Note    Patient Details  Name: Lisa Castillo MRN: [id number] Date of Birth: [birth year]  Transition of Care [facility]) CM/SW Contact:    Lennart Pall, LCSW Phone Number: 12/27/2019, 1:49 PM  Clinical Narrative:                 Met with pt and daughter this afternoon to review anticipated dc plans/ needs.  Pt has been a resident in an [place] apt at [facility] x 5 yrs.  She has remained active at home and community.  Daughter very supportive and eager to discuss current medical situation with MD.   We did discuss anticipated need for SNF when medically stable for dc and this can be secured at [facility].  Daughter states, "I need to know what's going on before she moves."  Will reach out to [place] liaison to prep for return to SNF when ready.  TOC will continue to follow.  Expected Discharge Plan: Skilled Nursing Facility Barriers to Discharge: Continued Medical Work up   Patient Goals and CMS Choice Patient states their goals for this hospitalization and ongoing recovery are:: to treat her pain      Expected Discharge Plan and Services Expected Discharge Plan: [place] In-house Referral: Clinical Social Work   Post Acute Care Choice: [place] Living arrangements for the past 2 months: [place]                 DME Arranged: N/A DME Agency: NA       HH Arranged: NA [place] Agency: NA        Prior Living Arrangements/Services Living arrangements for the past 2 months: [place] Lives with:: Self Patient language and need for interpreter reviewed:: Yes        Need for Family Participation in Patient Care: No (Comment) Care giver support system in place?: Yes (comment)   Criminal Activity/Legal Involvement Pertinent to Current Situation/Hospitalization: No - Comment as needed  Activities of Daily Living Home Assistive Devices/Equipment: None ADL Screening  (condition at time of admission) Patient's cognitive ability adequate to safely complete daily activities?: No Is the patient deaf or have difficulty hearing?: Yes Does the patient have difficulty seeing, even when wearing glasses/contacts?: No Does the patient have difficulty concentrating, remembering, or making decisions?: Yes Patient able to express need for assistance with ADLs?: No Does the patient have difficulty dressing or bathing?: Yes Independently performs ADLs?: No Communication: Independent Dressing (OT): Needs assistance Is this a change from baseline?: Change from baseline, expected to last >3 days Grooming: Independent Feeding: Needs assistance Is this a change from baseline?: Change from baseline, expected to last >3 days Bathing: Needs assistance Is this a change from baseline?: Change from baseline, expected to last >3 days Toileting: Needs assistance Is this a change from baseline?: Change from baseline, expected to last >3days In/Out Bed: Needs assistance Is this a change from baseline?: Change from baseline, expected to last >3 days Walks in Home: Needs assistance Is this a change from baseline?: Change from baseline, expected to last >3 days Does the patient have difficulty walking or climbing stairs?: Yes Weakness of Legs: Both Weakness of Arms/Hands: Both  Permission Sought/Granted   Permission granted to share information with : Yes, Verbal Permission Granted  Share Information with NAME: Curt Bears     Permission granted to share info w Relationship: daughter  Permission granted to share info w Contact Information:  [phone]  Emotional Assessment Appearance:: Appears stated age Attitude/Demeanor/Rapport: Gracious Affect (typically observed): Pleasant Orientation: : Oriented to Self, Oriented to Place, Oriented to Situation Alcohol / Substance Use: Not Applicable Psych Involvement: No (comment)  Admission diagnosis:  Burst fracture of  lumbar vertebra, closed, initial encounter [facility]) [S32.001A] Patient Active Problem List   Diagnosis Date Noted  . Burst fracture of lumbar vertebra, closed, initial encounter ([place]) 12/27/2019  . Malaise and fatigue 04/03/2019  . Confusion 04/03/2019  . Tachycardia 04/03/2019  . Osteopenia 06/14/2018  . Diabetes mellitus type 2 in obese ([place]) 11/09/2017  . Prediabetes 01/19/2017  . Obesity (BMI 30-39.9) 01/19/2017  . Mixed hyperlipidemia 06/29/2016  . Unstable gait 04/27/2016  . Hearing loss   . Hypothyroidism   . Insomnia    PCP:  [name], MD Pharmacy:   [id number]: [phone]: [phone] [id number] [place] [address]: [phone]: [phone]   Social Determinants of Health (SDOH) Interventions    Readmission Risk Interventions No flowsheet data found.

## 2019-12-27 NOTE — Plan of Care (Signed)
  Problem: Nutrition: Goal: Adequate nutrition will be maintained Outcome: Progressing   Problem: Elimination: Goal: Will not experience complications related to urinary retention Outcome: Progressing   Problem: Pain Managment: Goal: General experience of comfort will improve Outcome: Progressing   

## 2019-12-27 NOTE — Evaluation (Signed)
Occupational Therapy Evaluation Patient Details Name: Lisa Castillo MRN: [id number] DOB: [birth year] Today's Date: 12/27/2019    History of Present Illness 86 year old female with a history of diabetes, osteopenia, hypothyroidism, and unsteady gait who presented to the emergency department status post fall with back pain. Dx of L4 burst fx, to be treated conservatively.   Clinical Impression   [name] currently requiring assistance with ADLs including Total assist with LB dressing and toielting, and moderate to max assistance with bathing, grooming, and UE dressing all of which is below patient's typical baseline of being Modified independent.  During this evaluation, patient was limited by pain, confusion, impulsivity and generalized weakness, which has the potential to impact patient's safety and independence during functional mobility, as well as performance for ADLs. [place] "6-clicks" Daily Activity Inpatient Short Form score of 11/24 indicates 70.42% ADL impairment this session. Patient lives alone at [facility], and reports Independence with BADLs at baseline. Patient demonstrates fair rehab potential, and should benefit from continued skilled occupational therapy services while in acute care to maximize safety, independence and quality of life at home.  Continued occupational therapy services in a SNF setting prior to return home is recommended.  ?     Follow Up Recommendations  SNF;Supervision/Assistance - 24 hour    Equipment Recommendations       Recommendations for Other Services       Precautions / Restrictions Precautions Precautions: Back Precaution Comments: instructed pt in log roll Required Braces or Orthoses: Spinal Brace Spinal Brace: Thoracolumbosacral orthotic Restrictions Other Position/Activity Restrictions: use brace when OOB      Mobility Bed Mobility Overal bed mobility: Needs Assistance Bed Mobility: Rolling;Sit to  Sidelying;Sidelying to Sit Rolling: Mod assist Sidelying to sit: Max assist;+2 for physical assistance     Sit to sidelying: Total assist;+2 for physical assistance General bed mobility comments: VCs for log rolling technique, assist to raise trunk    Transfers Overall transfer level: Needs assistance Equipment used: Rolling walker (2 wheeled) Transfers: Sit to/from Stand Sit to Stand: Mod assist;From elevated surface;+2 physical assistance;+2 safety/equipment         General transfer comment: VCs hand placement, sit to stand with RW x 2 trials, pt only able to stand for ~3 seconds then had uncontrolled descent to the bed x2 without warning, pt unable to pivot, she was incontinent of urine in standing    Balance Overall balance assessment: Needs assistance Sitting-balance support: Feet supported;No upper extremity supported Sitting balance-Leahy Scale: Fair     Standing balance support: Bilateral upper extremity supported Standing balance-Leahy Scale: Poor Standing balance comment: pain limits standing tolerance to ~3 seconds, relies on BUE support                           ADL either performed or assessed with clinical judgement   ADL Overall ADL's : Needs assistance/impaired Eating/Feeding: Set up;Bed level Eating/Feeding Details (indicate cue type and reason): Not observed. Based on general assessment. Grooming: Bed level;Moderate assistance   Upper Body Bathing: Moderate assistance;Bed level   Lower Body Bathing: Maximal assistance;Bed level   Upper Body Dressing : Moderate assistance;Bed level Upper Body Dressing Details (indicate cue type and reason): Total assist to doff TLSO. Lower Body Dressing: Bed level;Total assistance     Toilet Transfer Details (indicate cue type and reason): Unable to safety pivot to [facility] despite trial. Toileting- Clothing Manipulation and Hygiene: Sitting/lateral lean;Bed level;Total assistance Toileting - Clothing  Manipulation Details (indicate cue type and reason): Pt with urinary incontinence when attempted sit to stand and required Total Assist for peri and leg hygiene.     Functional mobility during ADLs: Moderate assistance;Maximal assistance;+2 for safety/equipment;+2 for physical assistance General ADL Comments: MAx As x2 bed and Mod As x 2 transfers.     Vision   Vision Assessment?: No apparent visual deficits Additional Comments: Eyes often closed.     Perception     Praxis      Pertinent Vitals/Pain Pain Assessment: 0-10 Pain Score: 6  Pain Location: low back Pain Descriptors / Indicators: Grimacing;Guarding;Moaning Pain Intervention(s): Limited activity within patient's tolerance;Monitored during session;Premedicated before session;Repositioned     Hand Dominance Right   Extremity/Trunk Assessment Upper Extremity Assessment Upper Extremity Assessment: Difficult to assess due to impaired cognition (NT proximally due to spinal percautions and back pain.)   Lower Extremity Assessment Lower Extremity Assessment: Defer to PT evaluation   Cervical / Trunk Assessment Cervical / Trunk Assessment: Normal   Communication Communication Communication: No difficulties   Cognition Arousal/Alertness: Lethargic (Needs cues to keep eyes open.) Behavior During Therapy: Anxious;Impulsive Overall Cognitive Status: No family/caregiver present to determine baseline cognitive functioning                                 General Comments: oriented to self only   General Comments       Exercises     Shoulder Instructions      Home Living Family/patient expects to be discharged to:: Skilled nursing facility                                        Prior Functioning/Environment Level of Independence: Independent        Comments: pt is unreliable historian as she is oriented to self only. she stated she lives in an apartment at [facility] and that she  walks without an assistive device, she stated she is independent with ADLs.        OT Problem List: Decreased strength;Decreased cognition;Pain;Decreased safety awareness;Decreased activity tolerance;Impaired balance (sitting and/or standing);Decreased knowledge of use of DME or AE;Decreased knowledge of precautions      OT Treatment/Interventions: Self-care/ADL training;Therapeutic exercise;Therapeutic activities;Energy conservation;DME and/or AE instruction;Patient/family education;Balance training    OT Goals(Current goals can be found in the care plan section) Acute Rehab OT Goals Patient Stated Goal: Pt denied therapy goal other than decreased pain. OT Goal Formulation: With patient Time For Goal Achievement: 01/10/20 Potential to Achieve Goals: Fair ADL Goals Pt Will Perform Grooming: standing;with supervision Pt Will Perform Upper Body Bathing: sitting;with supervision;with set-up Pt Will Perform Lower Body Bathing: with adaptive equipment;with set-up;with min guard assist Pt Will Perform Upper Body Dressing: with set-up;sitting Pt Will Perform Lower Body Dressing: with adaptive equipment;with set-up;with min guard assist;sit to/from stand Pt Will Transfer to Toilet: ambulating;with min guard assist Pt Will Perform Toileting - Clothing Manipulation and hygiene: with supervision;with adaptive equipment;with set-up Additional ADL Goal #1: Pt will verbalize back precautions and demonstrate precautions correctly during functional mobility and ADLs without verbal cues while wearing TLSO.  OT Frequency: Min 2X/week   Barriers to D/C:            Co-evaluation PT/OT/SLP Co-Evaluation/Treatment: Yes Reason for Co-Treatment: For patient/therapist safety PT goals addressed during session: Mobility/safety with mobility;Balance;Proper use of DME  OT goals addressed during session: ADL's and self-care;Proper use of Adaptive equipment and DME      AM-PAC OT "6 Clicks" Daily Activity      Outcome Measure Help from another person eating meals?: A Little Help from another person taking care of personal grooming?: A Lot Help from another person toileting, which includes using toliet, bedpan, or urinal?: Total Help from another person bathing (including washing, rinsing, drying)?: A Lot Help from another person to put on and taking off regular upper body clothing?: A Lot Help from another person to put on and taking off regular lower body clothing?: Total 6 Click Score: 11   End of Session Equipment Utilized During Treatment: Gait belt;Rolling walker Nurse Communication: Mobility status  Activity Tolerance: Patient limited by pain;Patient limited by lethargy Patient left: in bed;with call bell/phone within reach;with bed alarm set  OT Visit Diagnosis: Repeated falls (R29.6);History of falling (Z91.81);Pain Pain - Right/Left: Right                Time: 9024-0973 OT Time Calculation (min): 22 min Charges:  OT General Charges $OT Visit: 1 Visit OT Evaluation $OT Eval Low Complexity: 1 Low  Sadiel Mota, OT Acute Rehab Services Office: [phone] 12/27/2019   Julien Girt 12/27/2019, 1:58 PM

## 2019-12-27 NOTE — Evaluation (Signed)
Physical Therapy Evaluation Patient Details Name: Lisa Castillo MRN: [id number] DOB: 1929-01-01 Today's Date: 12/27/2019   History of Present Illness  86 year old female with a history of diabetes, osteopenia, hypothyroidism, and unsteady gait who presented to the emergency department status post fall with back pain. Dx of L4 burst fx, to be treated conservatively.  Clinical Impression  Pt admitted with above diagnosis. Mod assist for log roll and then max assist for sidelying to sit. +2 mod assist for sit to stand with RW x 2 trials. Pt only able to stand for ~3 seconds 2* severe pain. She was incontinent of urine in standing. She had an uncontrolled descent to the bed. Pain limiting activity tolerance. SNF recommended.  Pt currently with functional limitations due to the deficits listed below (see PT Problem List). Pt will benefit from skilled PT to increase their independence and safety with mobility to allow discharge to the venue listed below.       Follow Up Recommendations SNF    Equipment Recommendations  Wheelchair (measurements PT);Wheelchair cushion (measurements PT)    Recommendations for Other Services       Precautions / Restrictions Precautions Precautions: Back Precaution Comments: instructed pt in log roll Required Braces or Orthoses: Spinal Brace Spinal Brace: Thoracolumbosacral orthotic Restrictions Other Position/Activity Restrictions: use brace when OOB      Mobility  Bed Mobility Overal bed mobility: Needs Assistance Bed Mobility: Rolling;Sit to Sidelying;Sidelying to Sit Rolling: Mod assist Sidelying to sit: Max assist     Sit to sidelying: Total assist General bed mobility comments: VCs for log rolling technique, assist to raise trunk    Transfers Overall transfer level: Needs assistance Equipment used: Rolling walker (2 wheeled) Transfers: Sit to/from Stand Sit to Stand: Mod assist;From elevated surface;+2 physical assistance;+2  safety/equipment         General transfer comment: VCs hand placement, sit to stand with RW x 2 trials, pt only able to stand for ~3 seconds then had uncontrolled descent to the bed, pt unable to pivot, she was incontinent of urine in standing  Ambulation/Gait                Stairs            Wheelchair Mobility    Modified Rankin (Stroke Patients Only)       Balance Overall balance assessment: Needs assistance Sitting-balance support: Feet supported;No upper extremity supported Sitting balance-Leahy Scale: Fair     Standing balance support: Bilateral upper extremity supported Standing balance-Leahy Scale: Poor Standing balance comment: pain limits standing tolerance to ~3 seconds, relies on BUE support                             Pertinent Vitals/Pain Pain Assessment: 0-10 Pain Score: 6  Pain Location: low back Pain Descriptors / Indicators: Grimacing;Guarding;Moaning Pain Intervention(s): Limited activity within patient's tolerance;Monitored during session;Premedicated before session;Repositioned    Home Living Family/patient expects to be discharged to:: Skilled nursing facility                      Prior Function Level of Independence: Independent         Comments: pt is unreliable historian as she is oriented to self only. she stated she lives in an apartment at [facility] and that she walks without an assistive device, she stated she is independent with ADLs.     Hand Dominance  Extremity/Trunk Assessment   Upper Extremity Assessment Upper Extremity Assessment: Defer to OT evaluation    Lower Extremity Assessment Lower Extremity Assessment: Generalized weakness;Difficult to assess due to impaired cognition    Cervical / Trunk Assessment Cervical / Trunk Assessment: Normal  Communication   Communication: No difficulties  Cognition Arousal/Alertness: Awake/alert Behavior During Therapy: WFL for tasks  assessed/performed Overall Cognitive Status: No family/caregiver present to determine baseline cognitive functioning                                 General Comments: oriented to self only      General Comments      Exercises     Assessment/Plan    PT Assessment Patient needs continued PT services  PT Problem List Decreased strength;Decreased activity tolerance;Decreased balance;Pain;Decreased mobility;Decreased cognition;Decreased safety awareness;Decreased knowledge of precautions       PT Treatment Interventions Therapeutic activities;Therapeutic exercise;Functional mobility training;Patient/family education;Cognitive remediation;Balance training;DME instruction;Gait training    PT Goals (Current goals can be found in the Care Plan section)  Acute Rehab PT Goals PT Goal Formulation: Patient unable to participate in goal setting Time For Goal Achievement: 01/10/20 Potential to Achieve Goals: Fair    Frequency Min 3X/week   Barriers to discharge        Co-evaluation PT/OT/SLP Co-Evaluation/Treatment: Yes Reason for Co-Treatment: Complexity of the patient's impairments (multi-system involvement);For patient/therapist safety;To address functional/ADL transfers;Necessary to address cognition/behavior during functional activity PT goals addressed during session: Mobility/safety with mobility;Balance;Proper use of DME         AM-PAC PT "6 Clicks" Mobility  Outcome Measure Help needed turning from your back to your side while in a flat bed without using bedrails?: A Lot Help needed moving from lying on your back to sitting on the side of a flat bed without using bedrails?: A Lot Help needed moving to and from a bed to a chair (including a wheelchair)?: Total Help needed standing up from a chair using your arms (e.g., wheelchair or bedside chair)?: A Lot Help needed to walk in hospital room?: Total Help needed climbing 3-5 steps with a railing? : Total 6  Click Score: 9    End of Session Equipment Utilized During Treatment: Gait belt;Back brace Activity Tolerance: Patient limited by pain Patient left: in bed;with call bell/phone within reach;with bed alarm set Nurse Communication: Mobility status;Need for lift equipment PT Visit Diagnosis: Difficulty in walking, not elsewhere classified (R26.2);Pain;History of falling (Z91.81)    Time: 7412-8786 PT Time Calculation (min) (ACUTE ONLY): 19 min   Charges:   PT Evaluation $PT Eval Moderate Complexity: 1 Mod         Philomena Doheny PT 12/27/2019  Acute Rehabilitation Services Pager [phone] Office [phone]

## 2019-12-27 NOTE — Progress Notes (Signed)
   Called in regards to this patients CT lumbar spine. It was reported that she had a fall at home and sustained an incomplete L4 burst fracture through the anterior and posterior walls and superior endplate. Given her age and history of osteopenia, surgical intervention is not warranted. Patient needs to don brace when up and walking around. We will see her in the office in 2 weeks with serial xrays.   Temp:  [97.6 F (36.4 C)-98.1 F (36.7 C)] 97.6 F (36.4 C) (10/28 0648) Pulse Rate:  [84-99] 96 (10/28 0648) Resp:  [16-28] 16 (10/28 0648) BP: (132-166)/(71-130) 133/79 (10/28 0648) SpO2:  [85 %-96 %] 94 % (10/28 0648) Weight:  [68 kg-71.6 kg] 71.6 kg (10/28 0656)   Eleonore Chiquito, NP 12/27/2019 8:50 AM

## 2019-12-27 NOTE — Progress Notes (Signed)
Lisa Castillo is a 86 y.o. female with medical history significant of DM2, osteopenia, unsteady gait, who presents to [facility] ED from ALF, found on ground by daughter with severe lower back pain, worsened with movement.  Unwitnessed fall.  On CT L spine, incomplete L4 burst fracture.  Neurosurgery consulted and recommended non surgical management, brace when up and waking around.  May be discharged when medically stable with back brace and follow up in neurosurgery's office in 2 weeks.  12/27/19: Reports severe lower back pain this morning, worse with minimal movement.  PT assessment recommended SNF.  TOC consulted to assist with SNF placement.  Continue pain control with IV narcotics as needed and closely monitor for any acute changes.  Please refer to H&P dictated by my partner Dr. Alcario Drought on 12/27/2019 for further details of the assessment and plan.

## 2019-12-27 NOTE — Progress Notes (Signed)
Orthopedic Tech Progress Note Patient Details:  Lisa Castillo 01-16-1929 [id number] Clamshell brace has been ordered  Patient ID: Lisa Castillo, female   DOB: [birth year], 86 y.o.   MRN: [id number]   Lisa Castillo Aqil Goetting 12/27/2019, 1:36 AM

## 2019-12-27 NOTE — H&P (Addendum)
History and Physical    Lisa Castillo FIE:[id number] DOB: 28-Mar-1928 DOA: 12/26/2019  PCP: [name], MD  Patient coming from: ALF  I have personally briefly reviewed patient's old medical records in [place]  Chief Complaint: Fall, back pain  HPI: Lisa Castillo is a 86 y.o. female with medical history significant of DM2, osteopenia, unsteady gait.  Pt presents to ED from ALF, found on ground by daughter.  Pt has severe back pain with movement.  Onset after fall.  Pain located in lower back.  Worse with movement.  Severe.  Nothing makes better.   ED Course: Incomplete L4 burst fracture.  EDP spoke with NS: they reviewed images, indicated that this was non-surgical, that patient may be discharged home with back brace and follow up in office in 2 weeks.  Pt has too much pain with movement to attempt to ambulate however, even after pain meds.  Hospitalist asked to admit for pain control.   Review of Systems: As per HPI, otherwise all review of systems negative.  Past Medical History:  Diagnosis Date  . Hearing loss   . History of fracture of clavicle   . Hypothyroidism   . Insomnia   . Tinnitus     Past Surgical History:  Procedure Laterality Date  . CHOLECYSTECTOMY  2007   Dr. Verdene Lennert  . CLAVICLE EXCISION  1986  . MOHS SURGERY     past 10 years chest & legs     reports that she has never smoked. She has never used smokeless tobacco. She reports current alcohol use. She reports that she does not use drugs.  Allergies  Allergen Reactions  . Metformin And Related Diarrhea    Family History  Problem Relation Age of Onset  . Heart disease Mother   . Heart disease Father      Prior to Admission medications   Medication Sig Start Date End Date Taking? Authorizing Provider  atorvastatin (LIPITOR) 10 MG tablet TAKE 1 TABLET BY MOUTH EVERY DAY Patient taking differently: Take 10 mg by mouth daily.  10/01/19  Yes [name], MD  calcium  carbonate (OSCAL) 1500 (600 Ca) MG TABS tablet Take 600 mg of elemental calcium by mouth daily.   Yes [provider]  Cholecalciferol (VITAMIN D3) 2000 units TABS Take 1 tablet by mouth daily.    Yes [provider]  glimepiride (AMARYL) 2 MG tablet TAKE 1 TABLET BY MOUTH EVERY DAY BEFORE BREAKFAST Patient taking differently: Take 2 mg by mouth daily with breakfast.  10/24/19  Yes [name], MD  levothyroxine (SYNTHROID) 75 MCG tablet TAKE 1 TABLET (75 MCG TOTAL) BY MOUTH DAILY BEFORE BREAKFAST. 08/14/19  Yes [name], MD  memantine (NAMENDA XR) 14 MG CP24 24 hr capsule Take 1 capsule (14 mg total) by mouth daily. 12/19/19  Yes [name], MD  Omega-3 1400 MG CAPS Take 1 capsule by mouth daily.    Yes [provider]  sertraline (ZOLOFT) 25 MG tablet TAKE 1 TABLET BY MOUTH EVERY DAY Patient taking differently: Take 25 mg by mouth daily.  12/26/19  Yes [name], MD  Blood Glucose Monitoring Suppl KIT Use to test blood sugar once daily daily. Dx: E11.69 05/25/19   Mast, Man X, NP  glucose blood test strip Use to test blood sugar once daily. Dx: E11.69 05/25/19   Mast, Man X, NP  Lancets 30G MISC Use to test blood sugar once daily. Dx: E11.69 05/25/19  Mast, Man X, NP  Lancets Misc. KIT 1 Device by Does not apply route daily. Use as directed 05/24/19   [name], MD    Physical Exam: Vitals:   12/27/19 0230 12/27/19 0300 12/27/19 0351 12/27/19 0355  BP: (!) 145/75 (!) 147/78 (!) 162/82   Pulse: 93 89 96   Resp: 19 16 (!) 22   Temp:   98.1 F (36.7 C)   TempSrc:   Oral   SpO2: 94% 95% (!) 85% 96%  Weight:      Height:        Constitutional: NAD, calm, comfortable Eyes: PERRL, lids and conjunctivae normal ENMT: Mucous membranes are moist. Posterior pharynx clear of any exudate or lesions.Normal dentition.  Neck: normal, supple, no masses, no thyromegaly Respiratory: clear to auscultation bilaterally, no wheezing, no crackles. Normal  respiratory effort. No accessory muscle use.  Cardiovascular: Regular rate and rhythm, no murmurs / rubs / gallops. No extremity edema. 2+ pedal pulses. No carotid bruits.  Abdomen: no tenderness, no masses palpated. No hepatosplenomegaly. Bowel sounds positive.  Musculoskeletal: no clubbing / cyanosis. No joint deformity upper and lower extremities. Good ROM, no contractures. Normal muscle tone.  Skin: no rashes, lesions, ulcers. No induration Neurologic: CN 2-12 grossly intact. Sensation intact, DTR normal. Strength 5/5 in all 4.  Psychiatric: Normal judgment and insight. Alert and oriented x 3. Normal mood.    Labs on Admission: I have personally reviewed following labs and imaging studies  CBC: Recent Labs  Lab 12/26/19 2122 12/26/19 2137  WBC 15.4*  --   NEUTROABS 13.4*  --   HGB 13.5 14.3  HCT 41.7 42.0  MCV 79.1*  --   PLT 297  --    Basic Metabolic Panel: Recent Labs  Lab 12/26/19 2137  NA 139  K 3.6  CL 101  GLUCOSE 169*  BUN 16  CREATININE 0.60   GFR: Estimated Creatinine Clearance: 41 mL/min (by C-G formula based on SCr of 0.6 mg/dL). Liver Function Tests: No results for input(s): AST, ALT, ALKPHOS, BILITOT, PROT, ALBUMIN in the last 168 hours. No results for input(s): LIPASE, AMYLASE in the last 168 hours. No results for input(s): AMMONIA in the last 168 hours. Coagulation Profile: No results for input(s): INR, PROTIME in the last 168 hours. Cardiac Enzymes: No results for input(s): CKTOTAL, CKMB, CKMBINDEX, TROPONINI in the last 168 hours. BNP (last 3 results) No results for input(s): PROBNP in the last 8760 hours. HbA1C: No results for input(s): HGBA1C in the last 72 hours. CBG: No results for input(s): GLUCAP in the last 168 hours. Lipid Profile: No results for input(s): CHOL, HDL, LDLCALC, TRIG, CHOLHDL, LDLDIRECT in the last 72 hours. Thyroid Function Tests: No results for input(s): TSH, T4TOTAL, FREET4, T3FREE, THYROIDAB in the last 72  hours. Anemia Panel: No results for input(s): VITAMINB12, FOLATE, FERRITIN, TIBC, IRON, RETICCTPCT in the last 72 hours. Urine analysis: No results found for: COLORURINE, APPEARANCEUR, LABSPEC, PHURINE, GLUCOSEU, HGBUR, BILIRUBINUR, KETONESUR, PROTEINUR, UROBILINOGEN, NITRITE, LEUKOCYTESUR  Radiological Exams on Admission: DG Lumbar Spine Complete  Result Date: 12/26/2019 CLINICAL DATA:  Unwitnessed fall, low back pain EXAM: LUMBAR SPINE - COMPLETE 4+ VIEW COMPARISON:  None. FINDINGS: Five lumbar vertebrae. The osseous structures appear diffusely demineralized which may limit detection of small or nondisplaced fractures. Age-indeterminate deformity involving the superior endplate L4 which could be related to a broad-based Schmorl's node formation no acute fracture is not excluded particularly given some cortical step-off anteriorly. No other acute fracture or height loss  is evident. Multilevel degenerative changes are present in the imaged portions of the spine. Lower thoracic flowing anterior osteophytosis, could suggest diffuse idiopathic skeletal hyperostosis (DISH). Interspinous arthrosis and facet arthropathy noted throughout the lumbar levels. Atherosclerotic calcification of the abdominal aorta. Remaining soft tissues are unremarkable. Calcified pelvic phleboliths. IMPRESSION: 1. Age-indeterminate deformity involving the superior endplate L4 which could be related to acute injury or a broad-based Schmorl's node formation. Correlate for point tenderness and consider cross-sectional imaging which will be more sensitive and specific for subtle injury in this patient with diffuse bony demineralization and advanced degenerative change. 2. Multilevel features of discogenic, facet degenerative and interspinous arthrosis as above. 3.  Aortic Atherosclerosis (ICD10-I70.0). Electronically Signed   By: Lovena Le M.D.   On: 12/26/2019 21:06   CT Lumbar Spine Wo Contrast  Result Date: 12/26/2019 CLINICAL  DATA:  Acute low back pain EXAM: CT LUMBAR SPINE WITHOUT CONTRAST TECHNIQUE: Multidetector CT imaging of the lumbar spine was performed without intravenous contrast administration. Multiplanar CT image reconstructions were also generated. COMPARISON:  None. FINDINGS: Segmentation: Standard Alignment: No static subluxation. Vertebrae: Acute incomplete burst fracture of L4 involving the anterior and posterior walls and the superior endplate. Minimal height loss and retropulsion. Chronic height loss of L5. Diffuse osteopenia. Paraspinal and other soft tissues: Calcific aortic atherosclerosis. Incompletely visualized cystic lesions of the upper pelvis. Disc levels: No spinal canal stenosis.  No neural impingement. IMPRESSION: 1. Acute incomplete burst fracture of L4 involving the anterior and posterior walls and the superior endplate. Minimal height loss and retropulsion. 2. Cystic masses of the upper pelvis, incompletely visualized. Pelvic ultrasound or CT abdomen/pelvis recommended. 3. No spinal canal stenosis. 4. Chronic height loss of L5. Aortic Atherosclerosis (ICD10-I70.0). Electronically Signed   By: Ulyses Jarred M.D.   On: 12/26/2019 23:22    EKG: Independently reviewed.  Assessment/Plan Principal Problem:   Burst fracture of lumbar vertebra, closed, initial encounter [facility]) Active Problems:   Hypothyroidism   Diabetes mellitus type 2 in obese ([place])    1. Burst fracture of L4, incomplete - 1. Admitting for pain control 2. IV morphine PRN ordered 1. though it sounds like shes only really in severe pain when she moves 3. NS consult  1. though it definitely sounded non-surgical from their discussion with EDP 4. Therefore will let pt eat 5. Brace ordered 2. Cystic mass(es) of pelvis - 1. Incompletely imaged on CT L spine 2. Will order the recommended CT abd/pelvis by radiologist. 3. Hypothyroidism - 1. Cont synthroid 4. DM2 - 1. Cont home amaryl  DVT prophylaxis: Lovenox Code Status:  Full Family Communication: No family in room Disposition Plan: ALF when pain controlled Consults called: NS Admission status: Place in 73    Ly Bacchi, [place] Hospitalists  How to contact the [facility] Attending or Consulting provider [place] or covering provider during after hours [place], for this patient?  1. Check the care team in [facility] and look for a) attending/consulting [facility] provider listed and b) the [facility] team listed 2. Log into [email or web address]  Amion Physician Scheduling and messaging for groups and whole hospitals  On call and physician scheduling software for group practices, residents, hospitalists and other medical providers for call, clinic, rotation and shift schedules. OnCall Enterprise is a hospital-wide system for scheduling doctors and paging doctors on call. EasyPlot is for scientific plotting and data analysis.  [email or web address]  and use [place]'s universal password to access. If you do not have the password, please contact  the hospital operator.  3. Locate the [facility] provider you are looking for under [facility] and page to a number that you can be directly reached. 4. If you still have difficulty reaching the provider, please page the [facility] (Director on Call) for the Hospitalists listed on amion for assistance.  12/27/2019, 4:00 AM

## 2019-12-28 ENCOUNTER — Inpatient Hospital Stay (HOSPITAL_COMMUNITY): Payer: Medicare Other

## 2019-12-28 DIAGNOSIS — S32001A Stable burst fracture of unspecified lumbar vertebra, initial encounter for closed fracture: Secondary | ICD-10-CM | POA: Diagnosis not present

## 2019-12-28 LAB — CBC
HCT: 39.9 % (ref 36.0–46.0)
Hemoglobin: 12.5 g/dL (ref 12.0–15.0)
MCH: 25.3 pg — ABNORMAL LOW (ref 26.0–34.0)
MCHC: 31.3 g/dL (ref 30.0–36.0)
MCV: 80.8 fL (ref 80.0–100.0)
Platelets: 245 10*3/uL (ref 150–400)
RBC: 4.94 MIL/uL (ref 3.87–5.11)
RDW: 14.4 % (ref 11.5–15.5)
WBC: 12.1 10*3/uL — ABNORMAL HIGH (ref 4.0–10.5)
nRBC: 0 % (ref 0.0–0.2)

## 2019-12-28 LAB — GLUCOSE, CAPILLARY
Glucose-Capillary: 121 mg/dL — ABNORMAL HIGH (ref 70–99)
Glucose-Capillary: 92 mg/dL (ref 70–99)
Glucose-Capillary: 142 mg/dL — ABNORMAL HIGH (ref 70–99)
Glucose-Capillary: 117 mg/dL — ABNORMAL HIGH (ref 70–99)

## 2019-12-28 MED ORDER — TRAMADOL HCL 50 MG PO TABS
50.0000 mg | ORAL_TABLET | Freq: Four times a day (QID) | ORAL | Status: DC | PRN
  Administered 2019-12-29: 50 mg via ORAL
  Filled 2019-12-28: qty 1

## 2019-12-28 MED ORDER — HYDROCODONE-ACETAMINOPHEN 5-325 MG PO TABS
1.0000 | ORAL_TABLET | Freq: Four times a day (QID) | ORAL | Status: DC | PRN
  Administered 2019-12-28 – 2019-12-30 (×3): 1 via ORAL
  Filled 2019-12-28 (×3): qty 1

## 2019-12-28 MED ORDER — SODIUM CHLORIDE 0.9 % IV SOLN: INTRAVENOUS | Status: DC

## 2019-12-28 MED ORDER — SENNOSIDES-DOCUSATE SODIUM 8.6-50 MG PO TABS
2.0000 | ORAL_TABLET | Freq: Every day | ORAL | Status: DC
  Administered 2019-12-28: 2 via ORAL
  Filled 2019-12-28: qty 2

## 2019-12-28 NOTE — NC FL2 (Signed)
[place] MEDICAID FL2 LEVEL OF CARE SCREENING TOOL     IDENTIFICATION  Patient Name: Lisa Castillo Birthdate: December 07, 1928 Sex: female Admission Date (Current Location): 12/26/2019  [facility] and [place] Number:  Herbalist and Address:  [facility],  [place], [place]      Provider Number: [id number]  Attending Physician Name and Address:  Kayleen Memos, DO  Relative Name and Phone Number:  daughter, Curt Bears @ [phone]    Current Level of Care: Hospital Recommended Level of Care: [place] Prior Approval Number:    Date Approved/Denied:   PASRR Number: [id number]  Discharge Plan: SNF    Current Diagnoses: Patient Active Problem List   Diagnosis Date Noted  . Burst fracture of lumbar vertebra, closed, initial encounter ([place]) 12/27/2019  . Malaise and fatigue 04/03/2019  . Confusion 04/03/2019  . Tachycardia 04/03/2019  . Osteopenia 06/14/2018  . Diabetes mellitus type 2 in obese ([place]) 11/09/2017  . Prediabetes 01/19/2017  . Obesity (BMI 30-39.9) 01/19/2017  . Mixed hyperlipidemia 06/29/2016  . Unstable gait 04/27/2016  . Hearing loss   . Hypothyroidism   . Insomnia     Orientation RESPIRATION BLADDER Height & Weight     Self, Place  Normal External catheter, Incontinent Weight: 157 lb 13.6 oz (71.6 kg) Height:  5' (152.4 cm)  BEHAVIORAL SYMPTOMS/MOOD NEUROLOGICAL BOWEL NUTRITION STATUS      Continent    AMBULATORY STATUS COMMUNICATION OF NEEDS Skin   Extensive Assist Verbally Normal                       Personal Care Assistance Level of Assistance  Bathing, Dressing Bathing Assistance: Limited assistance   Dressing Assistance: Limited assistance     Functional Limitations Info             SPECIAL CARE FACTORS FREQUENCY  PT (By licensed PT), OT (By licensed OT)     PT Frequency: 5x/wk OT Frequency: 5x/wk            Contractures      Additional Factors Info   Code Status, Allergies, Psychotropic Code Status Info: Full Allergies Info: see MAR Psychotropic Info: see MAR         Current Medications (12/28/2019):  This is the current hospital active medication list Current Facility-Administered Medications  Medication Dose Route Frequency Provider Last Rate Last Admin  . 0.9 %  sodium chloride infusion   Intravenous Continuous Kayleen Memos, DO 50 mL/hr at 12/28/19 0653 New Bag at 12/28/19 0653  . acetaminophen (TYLENOL) tablet 650 mg  650 mg Oral Q6H PRN Etta Quill, DO   650 mg at 12/28/19 6222   Or  . acetaminophen (TYLENOL) suppository 650 mg  650 mg Rectal Q6H PRN Etta Quill, DO      . atorvastatin (LIPITOR) tablet 10 mg  10 mg Oral Daily Jennette Kettle M, DO   10 mg at 12/28/19 0809  . calcium carbonate (OS-CAL - dosed in mg of elemental calcium) tablet 1,500 mg of elemental calcium  1,500 mg of elemental calcium Oral Daily Jennette Kettle M, DO   1,500 mg of elemental calcium at 12/28/19 0809  . cholecalciferol (VITAMIN D) tablet 1,000 Units  1,000 Units Oral Daily Etta Quill, DO   1,000 Units at 12/28/19 0809  . enoxaparin (LOVENOX) injection 40 mg  40 mg Subcutaneous Q24H Jennette Kettle M, DO   40 mg at 12/28/19 0806  .  glimepiride (AMARYL) tablet 2 mg  2 mg Oral Q breakfast Etta Quill, DO   2 mg at 12/28/19 0809  . levothyroxine (SYNTHROID) tablet 75 mcg  75 mcg Oral Q0600 Etta Quill, DO   75 mcg at 12/28/19 7262  . memantine (NAMENDA XR) 24 hr capsule 14 mg  14 mg Oral Daily Jennette Kettle M, DO   14 mg at 12/28/19 0809  . morphine 2 MG/ML injection 2-4 mg  2-4 mg Intravenous Q4H PRN Etta Quill, DO   2 mg at 12/27/19 1658  . omega-3 acid ethyl esters (LOVAZA) capsule 1 g  1 g Oral Daily Delora Fuel, MD   1 g at 12/28/19 0809  . ondansetron (ZOFRAN) tablet 4 mg  4 mg Oral Q6H PRN Etta Quill, DO       Or  . ondansetron [facility]) injection 4 mg  4 mg Intravenous Q6H PRN Etta Quill, DO      .  sertraline (ZOLOFT) tablet 25 mg  25 mg Oral Daily Jennette Kettle M, DO   25 mg at 12/28/19 0355     Discharge Medications: Please see discharge summary for a list of discharge medications.  Relevant Imaging Results:  Relevant Lab Results:   Additional Information SS# [id number]  Lennart Pall, LCSW

## 2019-12-28 NOTE — Progress Notes (Addendum)
PROGRESS NOTE  Lisa Castillo FUX:[id number] DOB: [birth year] DOA: 12/26/2019 PCP: [name], MD  HPI/Recap of past 24 hours: [name] a 86 y.o.femalewith medical history significant ofDM2, osteopenia, unsteady gait, who presents to [facility] ED from ALF, found on ground by daughter with severe lower back pain, worsened with movement.  Unwitnessed fall.  On CT L spine, incomplete L4 burst fracture.  Neurosurgery consulted and recommended non surgical management, brace when up and waking around.  May be discharged when medically stable with back brace and follow up in neurosurgery's office in 2 weeks.  PT assessment recommended SNF.  TOC consulted to assist with SNF placement.  12/28/19: Feels better today with new back brace. Worked with PT OT today. Her daughter is at bedside.  Assessment/Plan: Principal Problem:   Burst fracture of lumbar vertebra, closed, initial encounter ([place]) Active Problems:   Hypothyroidism   Diabetes mellitus type 2 in obese ([place])  Incomplete burst fracture of L4 post unwitnessed fall Not operable per neurosurgery Pain control Back brace when out of bed PT OT recommended SNF TOC assisting with SNF placement Continue fall precautions  Acute hypoxic respiratory failure secondary to atelectasis Advised to use incentive spirometer Out of bed as tolerated Normal oxygen supplementation at baseline Currently on 2 L with O2 saturation in the mid 90s Will need home O2 evaluation prior to DC.  Transient elevated BP likely related to pain No prior history of hypertension Not on BP medications at home Treat underlying condition Pain control  Left ovarian cystic mass Pelvic ultrasound ordered, follow results Will need to follow-up with gynecology outpatient  Hypothyroidism Obtain TSH Continue home Synthroid  Type 2 diabetes Obtain A1c Continue home regimen  Hyperlipidemia Resume home Lipitor  Chronic anxiety/depression Resume home  Zoloft  Mild dementia Resume home Namenda  Ambulatory dysfunction post fall PT OT Fall precautions  Code Status: Full code  Family Communication: Updated her daughter at bedside  Disposition Plan: SNF when medically stable   Consultants:  Neurosurgery  Procedures:  None.  Antimicrobials:  None.  DVT prophylaxis: Subcu Lovenox daily  Status is: Inpatient    Dispo:  Patient From: [place]  Planned Disposition: [place]  Expected discharge date: 12/29/19  Medically stable for discharge: No, ongoing management of incomplete burst L4 fracture.         Objective: Vitals:   12/28/19 0127 12/28/19 0455 12/28/19 0926 12/28/19 1339  BP: (!) 157/76 (!) 156/63 139/65 (!) 153/70  Pulse: 100 94 92 88  Resp: 17 16 18 18   Temp: 98.5 F (36.9 C) 98.3 F (36.8 C) 97.9 F (36.6 C) 98.2 F (36.8 C)  TempSrc: Oral  Oral Oral  SpO2: 95% 96% 98% 98%  Weight:      Height:        Intake/Output Summary (Last 24 hours) at 12/28/2019 1645 Last data filed at 12/28/2019 1500 Gross per 24 hour  Intake 915.83 ml  Output 550 ml  Net 365.83 ml   Filed Weights   12/26/19 2015 12/27/19 0656  Weight: 68 kg 71.6 kg    Exam:  . General: 86 y.o. year-old female well developed well nourished in no acute distress.  Alert and interactive.. . Cardiovascular: Regular rate and rhythm with no rubs or gallops.  No thyromegaly or JVD noted.   Marland Kitchen Respiratory: Clear to auscultation with no wheezes or rales. Good inspiratory effort. . Abdomen: Soft nontender nondistended with normal bowel sounds x4 quadrants. . Musculoskeletal: No lower  extremity edema bilaterally. Marland Kitchen Psychiatry: Mood is appropriate for condition and setting   Data Reviewed: CBC: Recent Labs  Lab 12/26/19 2122 12/26/19 2137 12/27/19 0640 12/28/19 0604  WBC 15.4*  --  14.1* 12.1*  NEUTROABS 13.4*  --   --   --   HGB 13.5 14.3 13.2 12.5  HCT 41.7 42.0 41.3 39.9  MCV 79.1*  --   80.8 80.8  PLT 297  --  256 706   Basic Metabolic Panel: Recent Labs  Lab 12/26/19 2137 12/27/19 0410  NA 139 136  K 3.6 4.5  CL 101 103  CO2  --  22  GLUCOSE 169* 127*  BUN 16 17  CREATININE 0.60 0.74  CALCIUM  --  8.8*   GFR: Estimated Creatinine Clearance: 42.1 mL/min (by C-G formula based on SCr of 0.74 mg/dL). Liver Function Tests: No results for input(s): AST, ALT, ALKPHOS, BILITOT, PROT, ALBUMIN in the last 168 hours. No results for input(s): LIPASE, AMYLASE in the last 168 hours. No results for input(s): AMMONIA in the last 168 hours. Coagulation Profile: No results for input(s): INR, PROTIME in the last 168 hours. Cardiac Enzymes: No results for input(s): CKTOTAL, CKMB, CKMBINDEX, TROPONINI in the last 168 hours. BNP (last 3 results) No results for input(s): PROBNP in the last 8760 hours. HbA1C: No results for input(s): HGBA1C in the last 72 hours. CBG: Recent Labs  Lab 12/27/19 1319 12/27/19 1615 12/27/19 2126 12/28/19 0723 12/28/19 1210  GLUCAP 154* 102* 100* 121* 92   Lipid Profile: No results for input(s): CHOL, HDL, LDLCALC, TRIG, CHOLHDL, LDLDIRECT in the last 72 hours. Thyroid Function Tests: No results for input(s): TSH, T4TOTAL, FREET4, T3FREE, THYROIDAB in the last 72 hours. Anemia Panel: No results for input(s): VITAMINB12, FOLATE, FERRITIN, TIBC, IRON, RETICCTPCT in the last 72 hours. Urine analysis: No results found for: COLORURINE, APPEARANCEUR, LABSPEC, PHURINE, GLUCOSEU, HGBUR, BILIRUBINUR, KETONESUR, PROTEINUR, UROBILINOGEN, NITRITE, LEUKOCYTESUR Sepsis Labs: @LABRCNTIP (procalcitonin:4,lacticidven:4)  ) Recent Results (from the past 240 hour(s))  Respiratory Panel by RT PCR (Flu A&B, Covid) - Nasopharyngeal Swab     Status: None   Collection Time: 12/27/19 12:10 AM   Specimen: Nasopharyngeal Swab  Result Value Ref Range Status   SARS Coronavirus 2 by RT PCR NEGATIVE NEGATIVE Final    Comment: (NOTE) SARS-CoV-2 target nucleic acids  are NOT DETECTED.  The SARS-CoV-2 RNA is generally detectable in upper respiratoy specimens during the acute phase of infection. The lowest concentration of SARS-CoV-2 viral copies this assay can detect is 131 copies/mL. A negative result does not preclude SARS-Cov-2 infection and should not be used as the sole basis for treatment or other patient management decisions. A negative result may occur with  improper specimen collection/handling, submission of specimen other than nasopharyngeal swab, presence of viral mutation(s) within the areas targeted by this assay, and inadequate number of viral copies (<131 copies/mL). A negative result must be combined with clinical observations, patient history, and epidemiological information. The expected result is Negative.  Fact Sheet for Patients:  PinkCheek.be  Fact Sheet for Healthcare Providers:  GravelBags.it  This test is no t yet approved or cleared by the [place] FDA and  has been authorized for detection and/or diagnosis of SARS-CoV-2 by FDA under an Emergency Use Authorization (EUA). This EUA will remain  in effect (meaning this test can be used) for the duration of the COVID-19 declaration under Section 564(b)(1) of the Act, 21 U.S.C. section 360bbb-3(b)(1), unless the authorization is terminated or revoked sooner.  Influenza A by PCR NEGATIVE NEGATIVE Final   Influenza B by PCR NEGATIVE NEGATIVE Final    Comment: (NOTE) The Xpert Xpress SARS-CoV-2/FLU/RSV assay is intended as an aid in  the diagnosis of influenza from Nasopharyngeal swab specimens and  should not be used as a sole basis for treatment. Nasal washings and  aspirates are unacceptable for Xpert Xpress SARS-CoV-2/FLU/RSV  testing.  Fact Sheet for Patients: PinkCheek.be  Fact Sheet for Healthcare Providers: GravelBags.it  This test is not  yet approved or cleared by the [place] FDA and  has been authorized for detection and/or diagnosis of SARS-CoV-2 by  FDA under an Emergency Use Authorization (EUA). This EUA will remain  in effect (meaning this test can be used) for the duration of the  Covid-19 declaration under Section 564(b)(1) of the Act, 21  U.S.C. section 360bbb-3(b)(1), unless the authorization is  terminated or revoked. Performed at [facility], [place] [address], [place], [place] [address]       Studies: No results found.  Scheduled Meds: . atorvastatin  10 mg Oral Daily  . calcium carbonate  1,500 mg of elemental calcium Oral Daily  . cholecalciferol  1,000 Units Oral Daily  . enoxaparin (LOVENOX) injection  40 mg Subcutaneous Q24H  . glimepiride  2 mg Oral Q breakfast  . levothyroxine  75 mcg Oral Q0600  . memantine  14 mg Oral Daily  . omega-3 acid ethyl esters  1 g Oral Daily  . sertraline  25 mg Oral Daily    Continuous Infusions: . sodium chloride 50 mL/hr at 12/28/19 0653     LOS: 1 day     Kayleen Memos, MD [facility] Pager [phone]  If 7PM-7AM, please contact night-coverage [email or web address] Password [email or web address] 12/28/2019, 4:45 PM

## 2019-12-28 NOTE — Progress Notes (Signed)
Physical Therapy Treatment Patient Details Name: Lisa Castillo MRN: [id number] DOB: [birth year] Today's Date: 12/28/2019    History of Present Illness 86 year old female with a history of diabetes, osteopenia, hypothyroidism, and unsteady gait who presented to the emergency department status post fall with back pain. Dx of L4 burst fx, to be treated conservatively.    PT Comments    Pt limited by pain with mobility this session. Pt tolerates supine BLE exercises without increased pain. Pt requires mod A to roll into sidelying with cues for log roll technique. Max assist for sidelying to sit, attempted 3 times but unable to achieve full upright sitting due to pain, and pt request to return to sidelying each time. Assisted pt back into supine, pt able to push through BLE to assist in scooting up in bed with therapist assist. Pt on RA with SpO2 94%at rest and desats to 89% with mobility- returned 2L O2 at EOS and notified RN. TLSO on upon arrival.   Follow Up Recommendations  SNF     Equipment Recommendations  Wheelchair (measurements PT);Wheelchair cushion (measurements PT)    Recommendations for Other Services       Precautions / Restrictions Precautions Precautions: Back Precaution Comments: reviewed log rolling Required Braces or Orthoses: Spinal Brace Spinal Brace: Thoracolumbosacral orthotic Restrictions Other Position/Activity Restrictions: use brace when OOB    Mobility  Bed Mobility Overal bed mobility: Needs Assistance Bed Mobility: Rolling;Sidelying to Sit Rolling: Mod assist Sidelying to sit: Max assist  General bed mobility comments: multimodal cues for log rolling, BUE using bedrail to assist, max A to push into sitting from sidelying x3 attempts but due to increased pain unable to achieve full upright sitting and returned to sidelying  Transfers   General transfer comment: unable due to pain  Ambulation/Gait  General Gait Details: unable to  attempt   Stairs             Wheelchair Mobility    Modified Rankin (Stroke Patients Only)       Balance       Sitting balance - Comments: unable to achieve full upright sitting             Cognition Arousal/Alertness: Awake/alert Behavior During Therapy: WFL for tasks assessed/performed Overall Cognitive Status: Within Functional Limits for tasks assessed  General Comments: pt HOH, but able to follow single step commands appropriately and consistently      Exercises General Exercises - Lower Extremity Ankle Circles/Pumps: Supine;AROM;Strengthening;Both;15 reps Quad Sets: Supine;AROM;Strengthening;10 reps Hip ABduction/ADduction: Supine;AROM;Strengthening;Both;10 reps    General Comments General comments (skin integrity, edema, etc.): Trialed RA with SpO2 94% at rest and 89% with mobility, returned 2L O2 at EOS and notified RN.      Pertinent Vitals/Pain Pain Assessment: Faces Faces Pain Scale: Hurts even more Pain Location: low back with mobility Pain Descriptors / Indicators: Grimacing;Guarding;Moaning Pain Intervention(s): Limited activity within patient's tolerance;Monitored during session;Repositioned    Home Living                      Prior Function            PT Goals (current goals can now be found in the care plan section) Acute Rehab PT Goals Patient Stated Goal: Pt denied therapy goal other than decreased pain. PT Goal Formulation: With patient Time For Goal Achievement: 01/10/20 Potential to Achieve Goals: Fair Progress towards PT goals: Progressing toward goals    Frequency    Min 3X/week  PT Plan Current plan remains appropriate;Other (comment) (SpO2 on RA and returned 2L at EOS)    Co-evaluation              AM-PAC PT "6 Clicks" Mobility   Outcome Measure  Help needed turning from your back to your side while in a flat bed without using bedrails?: A Lot Help needed moving from lying on your back to  sitting on the side of a flat bed without using bedrails?: A Lot Help needed moving to and from a bed to a chair (including a wheelchair)?: Total Help needed standing up from a chair using your arms (e.g., wheelchair or bedside chair)?: A Lot Help needed to walk in hospital room?: Total Help needed climbing 3-5 steps with a railing? : Total 6 Click Score: 9    End of Session Equipment Utilized During Treatment: Gait belt;Back brace Activity Tolerance: Patient limited by pain Patient left: in bed;with call bell/phone within reach;with family/visitor present Nurse Communication: Mobility status PT Visit Diagnosis: Difficulty in walking, not elsewhere classified (R26.2);Pain;History of falling (Z91.81)     Time: 2010-0712 PT Time Calculation (min) (ACUTE ONLY): 23 min  Charges:  $Therapeutic Exercise: 8-22 mins $Therapeutic Activity: 23-37 mins                      Tori Ilithyia Titzer PT, DPT 12/28/19, 1:05 PM

## 2019-12-29 DIAGNOSIS — S32001A Stable burst fracture of unspecified lumbar vertebra, initial encounter for closed fracture: Secondary | ICD-10-CM | POA: Diagnosis not present

## 2019-12-29 LAB — CBC
HCT: 39.3 % (ref 36.0–46.0)
Hemoglobin: 12.2 g/dL (ref 12.0–15.0)
MCH: 25.3 pg — ABNORMAL LOW (ref 26.0–34.0)
MCHC: 31 g/dL (ref 30.0–36.0)
MCV: 81.5 fL (ref 80.0–100.0)
Platelets: 233 10*3/uL (ref 150–400)
RBC: 4.82 MIL/uL (ref 3.87–5.11)
RDW: 13.9 % (ref 11.5–15.5)
WBC: 10 10*3/uL (ref 4.0–10.5)
nRBC: 0 % (ref 0.0–0.2)

## 2019-12-29 LAB — GLUCOSE, CAPILLARY
Glucose-Capillary: 126 mg/dL — ABNORMAL HIGH (ref 70–99)
Glucose-Capillary: 103 mg/dL — ABNORMAL HIGH (ref 70–99)
Glucose-Capillary: 78 mg/dL (ref 70–99)
Glucose-Capillary: 228 mg/dL — ABNORMAL HIGH (ref 70–99)

## 2019-12-29 LAB — TSH: TSH: 3.509 u[IU]/mL (ref 0.350–4.500)

## 2019-12-29 MED ORDER — SENNOSIDES-DOCUSATE SODIUM 8.6-50 MG PO TABS
2.0000 | ORAL_TABLET | Freq: Two times a day (BID) | ORAL | Status: DC
  Administered 2019-12-29 – 2019-12-30 (×2): 2 via ORAL
  Filled 2019-12-29 (×2): qty 2

## 2019-12-29 MED ORDER — GLUCERNA SHAKE PO LIQD
237.0000 mL | Freq: Three times a day (TID) | ORAL | Status: DC
  Administered 2019-12-29 – 2019-12-30 (×2): 237 mL via ORAL
  Filled 2019-12-29 (×5): qty 237

## 2019-12-29 MED ORDER — OXYCODONE HCL 5 MG PO TABS: 5.0000 mg | ORAL_TABLET | Freq: Four times a day (QID) | ORAL | Status: DC | PRN

## 2019-12-29 MED ORDER — TRAMADOL HCL 50 MG PO TABS
50.0000 mg | ORAL_TABLET | Freq: Two times a day (BID) | ORAL | Status: DC
  Administered 2019-12-30: 50 mg via ORAL
  Filled 2019-12-29: qty 1

## 2019-12-29 NOTE — Progress Notes (Signed)
Physical Therapy Treatment Patient Details Name: Lisa Castillo MRN: [id number] DOB: 1928-12-23 Today's Date: 12/29/2019    History of Present Illness 86 year old female with a history of diabetes, osteopenia, hypothyroidism, and unsteady gait who presented to the emergency department status post fall with back pain. Dx of L4 burst fx, to be treated conservatively.    PT Comments    Pt assisted with applying brace (daughter reports donning in supine so rolled to apply brace).  Pt reports pain upon sitting however agreeable for OOB to recliner today with some encouragement.  SpO2 94% on room air in sitting and 93% on room air after transferring to recliner.  Pt left off O2 [place] and RN aware.    Follow Up Recommendations  SNF     Equipment Recommendations  Wheelchair (measurements PT);Wheelchair cushion (measurements PT)    Recommendations for Other Services       Precautions / Restrictions Precautions Precautions: Back Precaution Comments: instructed on log rolling - applied brace in bed Required Braces or Orthoses: Spinal Brace Spinal Brace: Thoracolumbosacral orthotic Restrictions Other Position/Activity Restrictions: use brace when OOB    Mobility  Bed Mobility Overal bed mobility: Needs Assistance Bed Mobility: Rolling;Sidelying to Sit Rolling: Min assist       Sit to sidelying: Mod assist General bed mobility comments: cues for log roll technique; daughter reports pt appears in less pain today with rolling  Transfers Overall transfer level: Needs assistance Equipment used: Rolling walker (2 wheeled) Transfers: Sit to/from [facility] Sit to Stand: Mod assist;From elevated surface;+2 physical assistance;+2 safety/equipment Stand pivot transfers: Mod assist;+2 physical assistance;+2 safety/equipment       General transfer comment: verbal cues for use of RW, assist to rise, performed x2, pt with pain so returned to sitting prior to transfer to  recliner; pt with slow effortful steps over to recliner  Ambulation/Gait                 Stairs             Wheelchair Mobility    Modified Rankin (Stroke Patients Only)       Balance                                            Cognition Arousal/Alertness: Awake/alert Behavior During Therapy: WFL for tasks assessed/performed Overall Cognitive Status: Within Functional Limits for tasks assessed                                 General Comments: pt HOH, but able to follow single step commands appropriately and consistently; pt surprised she was still in hospital      Exercises      General Comments        Pertinent Vitals/Pain Pain Assessment: Faces Pain Score: 2  Faces Pain Scale: Hurts little more Pain Location: low back with mobility Pain Descriptors / Indicators: Grimacing;Dull Pain Intervention(s): Repositioned;Monitored during session;Premedicated before session    Home Living                      Prior Function            PT Goals (current goals can now be found in the care plan section) Progress towards PT goals: Progressing toward goals    Frequency  Min 3X/week      PT Plan Current plan remains appropriate    Co-evaluation              AM-PAC PT "6 Clicks" Mobility   Outcome Measure  Help needed turning from your back to your side while in a flat bed without using bedrails?: A Lot Help needed moving from lying on your back to sitting on the side of a flat bed without using bedrails?: A Lot Help needed moving to and from a bed to a chair (including a wheelchair)?: A Lot Help needed standing up from a chair using your arms (e.g., wheelchair or bedside chair)?: A Lot Help needed to walk in hospital room?: Total Help needed climbing 3-5 steps with a railing? : Total 6 Click Score: 10    End of Session Equipment Utilized During Treatment: Gait belt;Back brace Activity Tolerance:  Patient limited by pain Patient left: with call bell/phone within reach;with family/visitor present;in chair Nurse Communication: Mobility status PT Visit Diagnosis: Difficulty in walking, not elsewhere classified (R26.2);History of falling (Z91.81)     Time: 1113-1140 PT Time Calculation (min) (ACUTE ONLY): 27 min  Charges:  $Therapeutic Activity: 23-37 mins                     Jannette Spanner PT, DPT Acute Rehabilitation Services Pager: [phone] Office: [phone]  York Ram E 12/29/2019, 2:51 PM

## 2019-12-29 NOTE — Progress Notes (Addendum)
PROGRESS NOTE  Lisa Castillo NLZ:[id number] DOB: [birth year] DOA: 12/26/2019 PCP: [name], MD  HPI/Recap of past 24 hours: Lisa Castillo a 86 y.o.femalewith medical history significant ofDM2, osteopenia, unsteady gait, who presents to [facility] ED from ALF, found on ground by daughter with severe lower back pain, worsened with movement.  Unwitnessed fall.  On CT L spine, incomplete L4 burst fracture.  Neurosurgery consulted and recommended non surgical management, brace when up and waking around.  May be discharged when medically stable with back brace and follow up in neurosurgery's office in 2 weeks.  PT assessment recommended SNF.  TOC consulted to assist with SNF placement.  12/29/19: Alert and confused.  vague complaints of b/l shin soreness.  Assessment/Plan: Principal Problem:   Burst fracture of lumbar vertebra, closed, initial encounter ([place]) Active Problems:   Hypothyroidism   Diabetes mellitus type 2 in obese ([place])  Incomplete burst fracture of L4 post unwitnessed fall Not operable per neurosurgery Pain control with tramadol 50 mg twice daily x2 days and as needed oxycodone for severe pain for breakthrough pain Back brace when out of bed PT OT recommended SNF TOC assisting with SNF placement Continue PT OT with assistance and fall precautions. Will need to follow-up with neurosurgery outpatient.  Dementia with mild delirium Daughter requested to stop Namenda, discontinued due to dizziness as possible side effect  Recommended p.o. Seroquel 12.5 mg twice daily for mood, daughter would like to hold off for now and get approval from her PCP Delirium and precautions in place  B/L shin soreness, likely musculoskeletal PRN analgesic  Resolved acute hypoxic respiratory failure secondary to atelectasis Advised to use incentive spirometer Out of bed as tolerated Currently saturating 94% on room air. Not on oxygen supplementation at baseline.  Resolved  transient elevated BP likely related to pain No prior history of hypertension Not on BP medications at home Treat underlying condition Pain control  Left ovarian cystic mass Pelvic ultrasound completed. Discussed with gynecology, Dr. Juanna Cao on 12/29/19 Will need to follow-up with gynecology outpatient No planned inpatient surgical intervention.  Hypothyroidism Obtain TSH Continue home Synthroid  Type 2 diabetes A1c 6.8 on 12/13/2019 Continue home regimen  Hyperlipidemia Continue home Lipitor  Chronic anxiety/depression Continue home Zoloft  Dementia Home Namenda has been discontinued on 12/29/2019 at daughter's request.  Will need to follow-up with her primary care provider  Ambulatory dysfunction post fall PT OT recommended SNF TOC consulted to assist with SNF placement Continue PT OT with assistance and fall precautions.  Code Status: Full code  Family Communication: Updated her daughter at bedside  Disposition Plan: SNF when medically stable   Consultants:  Neurosurgery  Gynecology, discussed pelvic ultrasound findings with Dr. Juanna Cao on 12/29/2019, patient with follow-up with gynecology outpatient, no planned inpatient procedures.  Procedures:  None.  Antimicrobials:  None.  DVT prophylaxis: Subcu Lovenox daily  Status is: Inpatient    Dispo:  Patient From: [place]  Planned Disposition: [place]  Expected discharge date: 12/29/19  Medically stable for discharge: No, ongoing management of incomplete burst L4 fracture.         Objective: Vitals:   12/28/19 1339 12/28/19 2150 12/29/19 0620 12/29/19 1421  BP: (!) 153/70 (!) 159/81 (!) 173/82 129/63  Pulse: 88 93 92 95  Resp: 18 16 16 16   Temp: 98.2 F (36.8 C) 97.8 F (36.6 C) 98 F (36.7 C)   TempSrc: Oral Oral Oral   SpO2: 98% 93% 98% 94%  Weight:      Height:        Intake/Output Summary (Last 24 hours) at 12/29/2019  1520 Last data filed at 12/29/2019 1400 Gross per 24 hour  Intake 846.67 ml  Output 900 ml  Net -53.33 ml   Filed Weights   12/26/19 2015 12/27/19 0656  Weight: 68 kg 71.6 kg    Exam:  . Castillo: 86 y.o. year-old female evaluation no distress.  Alert and confused. . Cardiovascular: Regular rate and rhythm no rubs or gallops. Marland Kitchen Respiratory: Clear to auscultation no wheezes or rales.  . Abdomen: Soft nontender normal bowel sounds present.  Back brace in place.   . Musculoskeletal: No lower extremity edema bilaterally.   Marland Kitchen Psychiatry: Confused.  Data Reviewed: CBC: Recent Labs  Lab 12/26/19 2122 12/26/19 2137 12/27/19 0640 12/28/19 0604 12/29/19 0606  WBC 15.4*  --  14.1* 12.1* 10.0  NEUTROABS 13.4*  --   --   --   --   HGB 13.5 14.3 13.2 12.5 12.2  HCT 41.7 42.0 41.3 39.9 39.3  MCV 79.1*  --  80.8 80.8 81.5  PLT 297  --  256 245 409   Basic Metabolic Panel: Recent Labs  Lab 12/26/19 2137 12/27/19 0410  NA 139 136  K 3.6 4.5  CL 101 103  CO2  --  22  GLUCOSE 169* 127*  BUN 16 17  CREATININE 0.60 0.74  CALCIUM  --  8.8*   GFR: Estimated Creatinine Clearance: 42.1 mL/min (by C-G formula based on SCr of 0.74 mg/dL). Liver Function Tests: No results for input(s): AST, ALT, ALKPHOS, BILITOT, PROT, ALBUMIN in the last 168 hours. No results for input(s): LIPASE, AMYLASE in the last 168 hours. No results for input(s): AMMONIA in the last 168 hours. Coagulation Profile: No results for input(s): INR, PROTIME in the last 168 hours. Cardiac Enzymes: No results for input(s): CKTOTAL, CKMB, CKMBINDEX, TROPONINI in the last 168 hours. BNP (last 3 results) No results for input(s): PROBNP in the last 8760 hours. HbA1C: No results for input(s): HGBA1C in the last 72 hours. CBG: Recent Labs  Lab 12/28/19 1210 12/28/19 1731 12/28/19 2154 12/29/19 0720 12/29/19 1153  GLUCAP 92 142* 117* 126* 103*   Lipid Profile: No results for input(s): CHOL, HDL, LDLCALC, TRIG,  CHOLHDL, LDLDIRECT in the last 72 hours. Thyroid Function Tests: No results for input(s): TSH, T4TOTAL, FREET4, T3FREE, THYROIDAB in the last 72 hours. Anemia Panel: No results for input(s): VITAMINB12, FOLATE, FERRITIN, TIBC, IRON, RETICCTPCT in the last 72 hours. Urine analysis: No results found for: COLORURINE, APPEARANCEUR, LABSPEC, PHURINE, GLUCOSEU, HGBUR, BILIRUBINUR, KETONESUR, PROTEINUR, UROBILINOGEN, NITRITE, LEUKOCYTESUR Sepsis Labs: @LABRCNTIP (procalcitonin:4,lacticidven:4)  ) Recent Results (from the past 240 hour(s))  Respiratory Panel by RT PCR (Flu A&B, Covid) - Nasopharyngeal Swab     Status: None   Collection Time: 12/27/19 12:10 AM   Specimen: Nasopharyngeal Swab  Result Value Ref Range Status   SARS Coronavirus 2 by RT PCR NEGATIVE NEGATIVE Final    Comment: (NOTE) SARS-CoV-2 target nucleic acids are NOT DETECTED.  The SARS-CoV-2 RNA is generally detectable in upper respiratoy specimens during the acute phase of infection. The lowest concentration of SARS-CoV-2 viral copies this assay can detect is 131 copies/mL. A negative result does not preclude SARS-Cov-2 infection and should not be used as the sole basis for treatment or other patient management decisions. A negative result may occur with  improper specimen collection/handling, submission of specimen other than nasopharyngeal swab, presence of viral mutation(s) within  the areas targeted by this assay, and inadequate number of viral copies (<131 copies/mL). A negative result must be combined with clinical observations, patient history, and epidemiological information. The expected result is Negative.  Fact Sheet for Patients:  PinkCheek.be  Fact Sheet for Healthcare Providers:  GravelBags.it  This test is no t yet approved or cleared by the [place] FDA and  has been authorized for detection and/or diagnosis of SARS-CoV-2 by FDA under an  Emergency Use Authorization (EUA). This EUA will remain  in effect (meaning this test can be used) for the duration of the COVID-19 declaration under Section 564(b)(1) of the Act, 21 U.S.C. section 360bbb-3(b)(1), unless the authorization is terminated or revoked sooner.     Influenza A by PCR NEGATIVE NEGATIVE Final   Influenza B by PCR NEGATIVE NEGATIVE Final    Comment: (NOTE) The Xpert Xpress SARS-CoV-2/FLU/RSV assay is intended as an aid in  the diagnosis of influenza from Nasopharyngeal swab specimens and  should not be used as a sole basis for treatment. Nasal washings and  aspirates are unacceptable for Xpert Xpress SARS-CoV-2/FLU/RSV  testing.  Fact Sheet for Patients: PinkCheek.be  Fact Sheet for Healthcare Providers: GravelBags.it  This test is not yet approved or cleared by the [place] FDA and  has been authorized for detection and/or diagnosis of SARS-CoV-2 by  FDA under an Emergency Use Authorization (EUA). This EUA will remain  in effect (meaning this test can be used) for the duration of the  Covid-19 declaration under Section 564(b)(1) of the Act, 21  U.S.C. section 360bbb-3(b)(1), unless the authorization is  terminated or revoked. Performed at [facility], [place] [address] [place], [place] [address]       Studies: US PELVIS (TRANSABDOMINAL ONLY)  Result Date: 12/28/2019 CLINICAL DATA:  Cyst seen on CT, MR EXAM: TRANSABDOMINAL ULTRASOUND OF PELVIS TECHNIQUE: Transabdominal ultrasound examination of the pelvis was performed including evaluation of the uterus, ovaries, adnexal regions, and pelvic cul-de-sac. COMPARISON:  CT 12/27/2019 FINDINGS: Uterus Measurements: 6 x 2.9 x 3.9 cm = volume: 35 mL. Retroverted uterus. Atrophic appearance is quite typical for a patient of advanced age. Endometrium Thickness: 7.3 mm, top normal for patient age albeit with a small amount of  endometrial fluid. Right ovary Not visualized on these transabdominal images. Left ovary Cystic lesion in the left adnexa measuring 5.8 x 5.4 x 5.2 cm with estimated volume of 81 mL. Normal ovarian tissue is not well visualized. No significant internal complexity. Other findings:  No abnormal free fluid. IMPRESSION: Redemonstration of a cystic lesion arising in the left adnexa without visualization of the normal ovarian parenchyma. Measures 5.8 cm on this examination. Consider GYN consult and followup US in 3-6 months, or pelvis MRI w/o and w/ contrast for improved characterization. Note: This recommendation does not apply to premenarchal patients or to those with increased risk (genetic, family history, elevated tumor markers or other high-risk factors) of ovarian cancer. Reference: Radiology 2019 Nov; 293(2):359-371. Top-normal endometrial thickness albeit with a small amount of fluid within the endometrial canal warranting further evaluation. Endometrial sampling should be considered to exclude carcinoma. Nonvisualization of the right ovary. Retroverted, atrophic uterus. Electronically Signed   By: Lovena Le M.D.   On: 12/28/2019 17:08    Scheduled Meds: . atorvastatin  10 mg Oral Daily  . calcium carbonate  1,500 mg of elemental calcium Oral Daily  . cholecalciferol  1,000 Units Oral Daily  . enoxaparin (LOVENOX) injection  40 mg Subcutaneous Q24H  .  feeding supplement (GLUCERNA SHAKE)  237 mL Oral TID BM  . glimepiride  2 mg Oral Q breakfast  . levothyroxine  75 mcg Oral Q0600  . omega-3 acid ethyl esters  1 g Oral Daily  . senna-docusate  2 tablet Oral BID  . sertraline  25 mg Oral Daily  . traMADol  50 mg Oral Q12H    Continuous Infusions:    LOS: 2 days     Kayleen Memos, MD [facility] Pager [phone]  If 7PM-7AM, please contact night-coverage [email or web address] Password [email or web address] 12/29/2019, 3:20 PM

## 2019-12-29 NOTE — Plan of Care (Signed)
  Problem: Nutrition: Goal: Adequate nutrition will be maintained Outcome: Progressing   Problem: Coping: Goal: Level of anxiety will decrease Outcome: Progressing   Problem: Elimination: Goal: Will not experience complications related to urinary retention Outcome: Progressing   Problem: Pain Managment: Goal: General experience of comfort will improve Outcome: Progressing   

## 2019-12-29 NOTE — TOC Progression Note (Addendum)
Transition of Care [facility]) - Progression Note    Patient Details  Name: Julena Barbour MRN: [id number] Date of Birth: [birth year]  Transition of [facility]) CM/SW [place], [place] Phone Number: [phone] 12/29/2019, 12:30 PM  Clinical Narrative:     CSW left voicemail for daughter Curt Bears [phone], to speak about SNF placement and her concerns about [facility].  FL2 information sent to other SNF facilities in the area.  Expected Discharge Plan: [place] Barriers to Discharge: Continued Medical Work up  Expected Discharge Plan and Services Expected Discharge Plan: [place] In-house Referral: Clinical Social Work   Post Acute Care Choice: [place] Living arrangements for the past 2 months: [place]                 DME Arranged: N/A DME Agency: NA       HH Arranged: NA [place] Agency: NA         Social Determinants of Health (SDOH) Interventions    Readmission Risk Interventions No flowsheet data found.

## 2019-12-29 NOTE — Progress Notes (Signed)
Occupational Therapy Treatment Patient Details Name: Lisa Castillo MRN: [id number] DOB: [birth year] Today's Date: 12/29/2019    History of present illness 86 year old female with a history of diabetes, osteopenia, hypothyroidism, and unsteady gait who presented to the emergency department status post fall with back pain. Dx of L4 burst fx, to be treated conservatively.   OT comments  Pt sitting in chair and did participate in sit to stand with OT.  Pt knees buckled on 3rd stand and pt sat quickly.  pts daughter with lots of questions/comments regarding brace.   Pt did not complain about brace  Follow Up Recommendations  SNF;Supervision/Assistance - 24 hour    Equipment Recommendations  None recommended by OT    Recommendations for Other Services      Precautions / Restrictions Precautions Precautions: Back       Mobility Bed Mobility               General bed mobility comments: pt in chair  Transfers   Equipment used: Rolling walker (2 wheeled) Transfers: Sit to/from Stand Sit to Stand: Mod assist;From elevated surface;+2 physical assistance;+2 safety/equipment                  ADL either performed or assessed with clinical judgement   ADL Overall ADL's : Needs assistance/impaired                             Toileting- Clothing Manipulation and Hygiene: Sit to/from stand;Cueing for safety;Cueing for sequencing;+2 for safety/equipment;Moderate assistance;Total assistance Toileting - Clothing Manipulation Details (indicate cue type and reason): mod A sit to stand. total A for hygiene. knees buckled and pt sat quickly on 3rd stand. RN aware                       Cognition Arousal/Alertness: Awake/alert Behavior During Therapy: WFL for tasks assessed/performed Overall Cognitive Status: Within Functional Limits for tasks assessed                                 General Comments: pt HOH, but able to follow single step  commands appropriately and consistently                   Pertinent Vitals/ Pain       Pain Assessment: Faces Pain Score: 2  Pain Location: low back with mobility Pain Descriptors / Indicators: Dull Pain Intervention(s): Limited activity within patient's tolerance         Frequency  Min 2X/week        Progress Toward Goals  OT Goals(current goals can now be found in the care plan section)  Progress towards OT goals: Progressing toward goals     Plan Discharge plan remains appropriate       AM-PAC OT "6 Clicks" Daily Activity     Outcome Measure   Help from another person eating meals?: A Little Help from another person taking care of personal grooming?: A Lot Help from another person toileting, which includes using toliet, bedpan, or urinal?: Total Help from another person bathing (including washing, rinsing, drying)?: A Lot Help from another person to put on and taking off regular upper body clothing?: A Lot Help from another person to put on and taking off regular lower body clothing?: Total 6 Click Score: 11    End of Session Equipment Utilized During Treatment:  Rolling walker  OT Visit Diagnosis: Repeated falls (R29.6);History of falling (Z91.81);Pain Pain - Right/Left: Right   Activity Tolerance Patient tolerated treatment well   Patient Left with call bell/phone within reach;in chair;with family/visitor present   Nurse Communication Mobility status        Time: 0903-0149 OT Time Calculation (min): 34 min  Charges: OT General Charges $OT Visit: 1 Visit OT Treatments $Self Care/Home Management : 23-37 mins  Kari Baars, [place] [phone] Office- [place], Edwena Felty D 12/29/2019, 2:24 PM

## 2019-12-29 NOTE — Plan of Care (Signed)
Plan of care reviewed.  Cognitive limitations diminish the patient's ability to understand and retain new information.

## 2019-12-30 DIAGNOSIS — Z79899 Other long term (current) drug therapy: Secondary | ICD-10-CM | POA: Diagnosis not present

## 2019-12-30 DIAGNOSIS — R1319 Other dysphagia: Secondary | ICD-10-CM | POA: Diagnosis not present

## 2019-12-30 DIAGNOSIS — Z66 Do not resuscitate: Secondary | ICD-10-CM | POA: Diagnosis present

## 2019-12-30 DIAGNOSIS — M2578 Osteophyte, vertebrae: Secondary | ICD-10-CM | POA: Diagnosis not present

## 2019-12-30 DIAGNOSIS — R269 Unspecified abnormalities of gait and mobility: Secondary | ICD-10-CM | POA: Diagnosis not present

## 2019-12-30 DIAGNOSIS — S32040A Wedge compression fracture of fourth lumbar vertebra, initial encounter for closed fracture: Secondary | ICD-10-CM | POA: Diagnosis not present

## 2019-12-30 DIAGNOSIS — J188 Other pneumonia, unspecified organism: Secondary | ICD-10-CM | POA: Diagnosis not present

## 2019-12-30 DIAGNOSIS — F05 Delirium due to known physiological condition: Secondary | ICD-10-CM | POA: Diagnosis not present

## 2019-12-30 DIAGNOSIS — F419 Anxiety disorder, unspecified: Secondary | ICD-10-CM | POA: Diagnosis present

## 2019-12-30 DIAGNOSIS — Z888 Allergy status to other drugs, medicaments and biological substances status: Secondary | ICD-10-CM | POA: Diagnosis not present

## 2019-12-30 DIAGNOSIS — K5900 Constipation, unspecified: Secondary | ICD-10-CM | POA: Diagnosis not present

## 2019-12-30 DIAGNOSIS — Z4789 Encounter for other orthopedic aftercare: Secondary | ICD-10-CM | POA: Diagnosis not present

## 2019-12-30 DIAGNOSIS — M25572 Pain in left ankle and joints of left foot: Secondary | ICD-10-CM | POA: Diagnosis not present

## 2019-12-30 DIAGNOSIS — E119 Type 2 diabetes mellitus without complications: Secondary | ICD-10-CM | POA: Diagnosis present

## 2019-12-30 DIAGNOSIS — R220 Localized swelling, mass and lump, head: Secondary | ICD-10-CM | POA: Diagnosis not present

## 2019-12-30 DIAGNOSIS — N83202 Unspecified ovarian cyst, left side: Secondary | ICD-10-CM | POA: Diagnosis not present

## 2019-12-30 DIAGNOSIS — R339 Retention of urine, unspecified: Secondary | ICD-10-CM | POA: Diagnosis not present

## 2019-12-30 DIAGNOSIS — E038 Other specified hypothyroidism: Secondary | ICD-10-CM | POA: Diagnosis not present

## 2019-12-30 DIAGNOSIS — G4700 Insomnia, unspecified: Secondary | ICD-10-CM | POA: Diagnosis present

## 2019-12-30 DIAGNOSIS — E039 Hypothyroidism, unspecified: Secondary | ICD-10-CM | POA: Diagnosis present

## 2019-12-30 DIAGNOSIS — S32001A Stable burst fracture of unspecified lumbar vertebra, initial encounter for closed fracture: Secondary | ICD-10-CM | POA: Diagnosis not present

## 2019-12-30 DIAGNOSIS — M7989 Other specified soft tissue disorders: Secondary | ICD-10-CM | POA: Diagnosis not present

## 2019-12-30 DIAGNOSIS — E1169 Type 2 diabetes mellitus with other specified complication: Secondary | ICD-10-CM | POA: Diagnosis not present

## 2019-12-30 DIAGNOSIS — R2681 Unsteadiness on feet: Secondary | ICD-10-CM | POA: Diagnosis not present

## 2019-12-30 DIAGNOSIS — S72002A Fracture of unspecified part of neck of left femur, initial encounter for closed fracture: Secondary | ICD-10-CM | POA: Diagnosis present

## 2019-12-30 DIAGNOSIS — S62102A Fracture of unspecified carpal bone, left wrist, initial encounter for closed fracture: Secondary | ICD-10-CM | POA: Diagnosis not present

## 2019-12-30 DIAGNOSIS — H9190 Unspecified hearing loss, unspecified ear: Secondary | ICD-10-CM | POA: Diagnosis present

## 2019-12-30 DIAGNOSIS — D62 Acute posthemorrhagic anemia: Secondary | ICD-10-CM | POA: Diagnosis not present

## 2019-12-30 DIAGNOSIS — F0280 Dementia in other diseases classified elsewhere without behavioral disturbance: Secondary | ICD-10-CM | POA: Diagnosis not present

## 2019-12-30 DIAGNOSIS — M25571 Pain in right ankle and joints of right foot: Secondary | ICD-10-CM | POA: Diagnosis not present

## 2019-12-30 DIAGNOSIS — M1612 Unilateral primary osteoarthritis, left hip: Secondary | ICD-10-CM | POA: Diagnosis not present

## 2019-12-30 DIAGNOSIS — F32A Depression, unspecified: Secondary | ICD-10-CM | POA: Diagnosis present

## 2019-12-30 DIAGNOSIS — Z23 Encounter for immunization: Secondary | ICD-10-CM | POA: Diagnosis not present

## 2019-12-30 DIAGNOSIS — S32001S Stable burst fracture of unspecified lumbar vertebra, sequela: Secondary | ICD-10-CM | POA: Diagnosis not present

## 2019-12-30 DIAGNOSIS — F0390 Unspecified dementia without behavioral disturbance: Secondary | ICD-10-CM | POA: Diagnosis present

## 2019-12-30 DIAGNOSIS — S199XXA Unspecified injury of neck, initial encounter: Secondary | ICD-10-CM | POA: Diagnosis not present

## 2019-12-30 DIAGNOSIS — S52502A Unspecified fracture of the lower end of left radius, initial encounter for closed fracture: Secondary | ICD-10-CM | POA: Diagnosis present

## 2019-12-30 DIAGNOSIS — F418 Other specified anxiety disorders: Secondary | ICD-10-CM | POA: Diagnosis not present

## 2019-12-30 DIAGNOSIS — W101XXA Fall (on)(from) sidewalk curb, initial encounter: Secondary | ICD-10-CM | POA: Diagnosis present

## 2019-12-30 DIAGNOSIS — R000 Tachycardia, unspecified: Secondary | ICD-10-CM | POA: Diagnosis not present

## 2019-12-30 DIAGNOSIS — R1310 Dysphagia, unspecified: Secondary | ICD-10-CM | POA: Diagnosis not present

## 2019-12-30 DIAGNOSIS — S72142A Displaced intertrochanteric fracture of left femur, initial encounter for closed fracture: Secondary | ICD-10-CM | POA: Diagnosis present

## 2019-12-30 DIAGNOSIS — S0081XA Abrasion of other part of head, initial encounter: Secondary | ICD-10-CM | POA: Diagnosis present

## 2019-12-30 DIAGNOSIS — M8580 Other specified disorders of bone density and structure, unspecified site: Secondary | ICD-10-CM | POA: Diagnosis present

## 2019-12-30 DIAGNOSIS — K625 Hemorrhage of anus and rectum: Secondary | ICD-10-CM | POA: Diagnosis not present

## 2019-12-30 DIAGNOSIS — S52592A Other fractures of lower end of left radius, initial encounter for closed fracture: Secondary | ICD-10-CM | POA: Diagnosis not present

## 2019-12-30 DIAGNOSIS — R410 Disorientation, unspecified: Secondary | ICD-10-CM | POA: Diagnosis not present

## 2019-12-30 DIAGNOSIS — S72002D Fracture of unspecified part of neck of left femur, subsequent encounter for closed fracture with routine healing: Secondary | ICD-10-CM | POA: Diagnosis not present

## 2019-12-30 DIAGNOSIS — F0391 Unspecified dementia with behavioral disturbance: Secondary | ICD-10-CM | POA: Diagnosis not present

## 2019-12-30 DIAGNOSIS — M47812 Spondylosis without myelopathy or radiculopathy, cervical region: Secondary | ICD-10-CM | POA: Diagnosis not present

## 2019-12-30 DIAGNOSIS — S72102A Unspecified trochanteric fracture of left femur, initial encounter for closed fracture: Secondary | ICD-10-CM | POA: Diagnosis not present

## 2019-12-30 DIAGNOSIS — K111 Hypertrophy of salivary gland: Secondary | ICD-10-CM | POA: Diagnosis not present

## 2019-12-30 DIAGNOSIS — R296 Repeated falls: Secondary | ICD-10-CM | POA: Diagnosis not present

## 2019-12-30 DIAGNOSIS — W19XXXA Unspecified fall, initial encounter: Secondary | ICD-10-CM | POA: Diagnosis not present

## 2019-12-30 DIAGNOSIS — M19011 Primary osteoarthritis, right shoulder: Secondary | ICD-10-CM | POA: Diagnosis present

## 2019-12-30 DIAGNOSIS — M5459 Other low back pain: Secondary | ICD-10-CM | POA: Diagnosis not present

## 2019-12-30 DIAGNOSIS — Z79890 Hormone replacement therapy (postmenopausal): Secondary | ICD-10-CM | POA: Diagnosis not present

## 2019-12-30 DIAGNOSIS — M6281 Muscle weakness (generalized): Secondary | ICD-10-CM | POA: Diagnosis not present

## 2019-12-30 DIAGNOSIS — E782 Mixed hyperlipidemia: Secondary | ICD-10-CM | POA: Diagnosis not present

## 2019-12-30 DIAGNOSIS — Z9181 History of falling: Secondary | ICD-10-CM | POA: Diagnosis not present

## 2019-12-30 DIAGNOSIS — N838 Other noninflammatory disorders of ovary, fallopian tube and broad ligament: Secondary | ICD-10-CM | POA: Diagnosis not present

## 2019-12-30 DIAGNOSIS — R4789 Other speech disturbances: Secondary | ICD-10-CM | POA: Diagnosis not present

## 2019-12-30 DIAGNOSIS — J189 Pneumonia, unspecified organism: Secondary | ICD-10-CM | POA: Diagnosis not present

## 2019-12-30 DIAGNOSIS — Y92128 Other place in nursing home as the place of occurrence of the external cause: Secondary | ICD-10-CM | POA: Diagnosis not present

## 2019-12-30 DIAGNOSIS — Z7984 Long term (current) use of oral hypoglycemic drugs: Secondary | ICD-10-CM | POA: Diagnosis not present

## 2019-12-30 DIAGNOSIS — F0150 Vascular dementia without behavioral disturbance: Secondary | ICD-10-CM | POA: Diagnosis not present

## 2019-12-30 DIAGNOSIS — I10 Essential (primary) hypertension: Secondary | ICD-10-CM | POA: Diagnosis not present

## 2019-12-30 DIAGNOSIS — Z20822 Contact with and (suspected) exposure to covid-19: Secondary | ICD-10-CM | POA: Diagnosis present

## 2019-12-30 DIAGNOSIS — S0993XA Unspecified injury of face, initial encounter: Secondary | ICD-10-CM | POA: Diagnosis not present

## 2019-12-30 DIAGNOSIS — S32001D Stable burst fracture of unspecified lumbar vertebra, subsequent encounter for fracture with routine healing: Secondary | ICD-10-CM | POA: Diagnosis not present

## 2019-12-30 DIAGNOSIS — E669 Obesity, unspecified: Secondary | ICD-10-CM | POA: Diagnosis not present

## 2019-12-30 LAB — RESPIRATORY PANEL BY RT PCR (FLU A&B, COVID)
Influenza A by PCR: NEGATIVE
Influenza B by PCR: NEGATIVE
SARS Coronavirus 2 by RT PCR: NEGATIVE

## 2019-12-30 LAB — GLUCOSE, CAPILLARY
Glucose-Capillary: 136 mg/dL — ABNORMAL HIGH (ref 70–99)
Glucose-Capillary: 112 mg/dL — ABNORMAL HIGH (ref 70–99)
Glucose-Capillary: 118 mg/dL — ABNORMAL HIGH (ref 70–99)

## 2019-12-30 MED ORDER — GLUCERNA SHAKE PO LIQD: 237.0000 mL | Freq: Three times a day (TID) | ORAL | 0 refills | Status: DC

## 2019-12-30 MED ORDER — TRAMADOL HCL 50 MG PO TABS: 50.0000 mg | ORAL_TABLET | Freq: Two times a day (BID) | ORAL | 0 refills | Status: AC

## 2019-12-30 MED ORDER — SENNOSIDES-DOCUSATE SODIUM 8.6-50 MG PO TABS: 2.0000 | ORAL_TABLET | Freq: Two times a day (BID) | ORAL | 0 refills | Status: AC

## 2019-12-30 NOTE — Plan of Care (Signed)
Patient discharged to [facility], waiting on [place]. Report has been called

## 2019-12-30 NOTE — Discharge Summary (Signed)
Discharge Summary  Lisa Castillo MVE:[id number] DOB: [birth year]  PCP: [name], MD  Admit date: 12/26/2019 Discharge date: 12/30/2019  Time spent: 35 minutes  Recommendations for Outpatient Follow-up:  1. Follow-up with neurosurgery in 1 to 2 weeks. 2. Follow-up with gynecology 3. Follow-up with your primary care provider 4. Continue PT OT with assistance and fall precautions.  Discharge Diagnoses:  Active Hospital Problems   Diagnosis Date Noted  . Burst fracture of lumbar vertebra, closed, initial encounter ([place]) 12/27/2019  . Diabetes mellitus type 2 in obese ([place]) 11/09/2017  . Hypothyroidism     Resolved Hospital Problems  No resolved problems to display.    Discharge Condition: Stable  Diet recommendation: Resume previous diet.  Vitals:   12/30/19 0444 12/30/19 1352  BP: (!) 173/88 (!) 145/69  Pulse: 100 98  Resp: 18 (!) 22  Temp: 98.1 F (36.7 C) 98.5 F (36.9 C)  SpO2: 92% 95%    History of present illness:  Lisa Castillo a 86 y.o.femalewith medical history significant ofDM2, dementia, osteopenia, unsteady gait, [facility], found on the ground by her daughterwithsevere lowerback pain, worsened with movement.This was an unwitnessedfall. CT L spine done on 12/27/2019 showed incomplete L4 burst fracture.Neurosurgery was consulted and recommended non surgical management, brace when up and waking around. Also recommended to dischargewhen medically stablewith back brace and to follow up in neurosurgery'soffice in 2 weeks.  PT OT assessed and recommended SNF. TOC consulted to assist with SNF placement.  12/30/19: No acute events overnight.  No new complaints.   Hospital Course:  Principal Problem:   Burst fracture of lumbar vertebra, closed, initial encounter [facility]) Active Problems:   Hypothyroidism   Diabetes mellitus type 2 in obese ([place])  Incomplete burst fracture of L4 post unwitnessed fall at  assisted living facility Not operable per neurosurgery, seen by Glenford Peers Follow-up at neurosurgery's clinic in 1-2 weeks Pain control with tramadol as needed for severe pain. Brace when up and walking around.  Left ovarian cystic mass Pelvic ultrasound completed. Discussed with gynecology, Dr. Juanna Cao on 12/29/19 Will need to follow-up with gynecology outpatient No planned inpatient surgical intervention. Follow-up with Dr. Jimmye Norman  Dementia with mild delirium Daughter requested to stop Namenda, discontinued due to dizziness as possible side effect  Recommended p.o. Seroquel 12.5 mg twice daily for mood, daughter would like to hold off on starting for now and get approval from her PCP Continue delirium and fall precautions Follow-up with your PCP.  B/L shin soreness, likely musculoskeletal PRN analgesic, Tylenol as needed  Resolved acute hypoxic respiratory failure secondary to atelectasis Advised to use incentive spirometer Out of bed as tolerated Currently saturating 95-98% on room air. Not on oxygen supplementation at baseline.  Transiently elevated BP likely related to pain No prior history of hypertension Not on BP medications at home Pain control Follow-up with your PCP.  Hypothyroidism TSH 3.5 on 12/29/2019. Continue home Synthroid  Type 2 diabetes, well controlled. A1c 6.8 on 12/13/2019 Continue home glimepiride  Hyperlipidemia Continue home Lipitor  Chronic anxiety/depression Continue home Zoloft  Dementia Home Namenda has been discontinued on 12/29/2019 at daughter's request.  Will need to follow-up with her primary care provider  Ambulatory dysfunction post fall PT OT recommended SNF TOC consulted to assist with SNF placement Continue PT OT with assistance and fall precautions.  Code Status: Full code    Procedures:  None.  Antimicrobials:  None.   Consultations:  Neurosurgery.  Discussed left ovarian  cyst  findings via phone with gynecology Dr. Juanna Cao, recommended follow-up outpatient.  Discharge Exam: BP (!) 145/69 (BP Location: Left Arm)   Pulse 98   Temp 98.5 F (36.9 C) (Oral)   Resp (!) 22   Ht 5' (1.524 m)   Wt 71.6 kg   SpO2 95%   BMI 30.83 kg/m  . General: 86 y.o. year-old female well developed well nourished in no acute distress.  Alert and interactive. . Cardiovascular: Regular rate and rhythm with no rubs or gallops.  No thyromegaly or JVD noted.   Marland Kitchen Respiratory: Clear to auscultation with no wheezes or rales. Good inspiratory effort. . Abdomen: Soft nontender nondistended with normal bowel sounds x4 quadrants. . Musculoskeletal: No lower extremity edema bilaterally.  Marland Kitchen Psychiatry: Mood is appropriate for condition and setting  Discharge Instructions You were cared for by a hospitalist during your hospital stay. If you have any questions about your discharge medications or the care you received while you were in the hospital after you are discharged, you can call the unit and asked to speak with the hospitalist on call if the hospitalist that took care of you is not available. Once you are discharged, your primary care physician will handle any further medical issues. Please note that NO REFILLS for any discharge medications will be authorized once you are discharged, as it is imperative that you return to your primary care physician (or establish a relationship with a primary care physician if you do not have one) for your aftercare needs so that they can reassess your need for medications and monitor your lab values.   Allergies as of 12/30/2019      Reactions   Metformin And Related Diarrhea      Medication List    STOP taking these medications   memantine 14 MG Cp24 24 hr capsule Commonly known as: Namenda XR     TAKE these medications   atorvastatin 10 MG tablet Commonly known as: LIPITOR TAKE 1 TABLET BY MOUTH EVERY DAY   Blood Glucose Monitoring  Suppl Kit Use to test blood sugar once daily daily. Dx: E11.69   calcium carbonate 1500 (600 Ca) MG Tabs tablet Commonly known as: OSCAL Take 600 mg of elemental calcium by mouth daily.   feeding supplement (GLUCERNA SHAKE) Liqd Take 237 mLs by mouth 3 (three) times daily between meals for 7 days.   glimepiride 2 MG tablet Commonly known as: AMARYL TAKE 1 TABLET BY MOUTH EVERY DAY BEFORE BREAKFAST What changed: See the new instructions.   glucose blood test strip Use to test blood sugar once daily. Dx: E11.69   Lancets 30G Misc Use to test blood sugar once daily. Dx: E11.69   Lancets Misc. Kit 1 Device by Does not apply route daily. Use as directed   levothyroxine 75 MCG tablet Commonly known as: SYNTHROID TAKE 1 TABLET (75 MCG TOTAL) BY MOUTH DAILY BEFORE BREAKFAST.   Omega-3 1400 MG Caps Take 1 capsule by mouth daily.   senna-docusate 8.6-50 MG tablet Commonly known as: Senokot-S Take 2 tablets by mouth 2 (two) times daily for 7 days.   sertraline 25 MG tablet Commonly known as: ZOLOFT TAKE 1 TABLET BY MOUTH EVERY DAY   traMADol 50 MG tablet Commonly known as: ULTRAM Take 1 tablet (50 mg total) by mouth every 12 (twelve) hours for 3 days.   Vitamin D3 50 MCG (2000 UT) Tabs Take 1 tablet by mouth daily.  Durable Medical Equipment  (From admission, onward)         Start     Ordered   12/28/19 1645  For home use only DME standard manual wheelchair with seat cushion  Once       Comments: Patient suffers from ambulatory dysfunction which impairs their ability to perform daily activities like walking in the home.  A walker will not resolve issue with performing activities of daily living. A wheelchair will allow patient to safely perform daily activities. Patient can safely propel the wheelchair in the home or has a caregiver who can provide assistance. Length of need 6 months. Accessories: elevating leg rests, wheel locks, extensions and anti-tippers.    12/28/19 1645         Allergies  Allergen Reactions  . Metformin And Related Diarrhea    Follow-up Information    [name], MD. Call in 1 day(s).   Specialty: Internal Medicine Why: Please call for a post hospital follow up appointment. Contact information: [place] [address] [phone]        Eleonore Chiquito, NP. Call in 1 day(s).   Specialty: Neurosurgery Why: Please call for a post hospital follow-up appointment. Contact information: [address] [place] [address] [phone]        Cherre Blanc, MD. Call in 1 day(s).   Specialty: Obstetrics and Gynecology Why: Please call for a post hospital follow-up appointment. Contact information: [place] [address] [phone]                The results of significant diagnostics from this hospitalization (including imaging, microbiology, ancillary and laboratory) are listed below for reference.    Significant Diagnostic Studies: DG Lumbar Spine Complete  Result Date: 12/26/2019 CLINICAL DATA:  Unwitnessed fall, low back pain EXAM: LUMBAR SPINE - COMPLETE 4+ VIEW COMPARISON:  None. FINDINGS: Five lumbar vertebrae. The osseous structures appear diffusely demineralized which may limit detection of small or nondisplaced fractures. Age-indeterminate deformity involving the superior endplate L4 which could be related to a broad-based Schmorl's node formation no acute fracture is not excluded particularly given some cortical step-off anteriorly. No other acute fracture or height loss is evident. Multilevel degenerative changes are present in the imaged portions of the spine. Lower thoracic flowing anterior osteophytosis, could suggest diffuse idiopathic skeletal hyperostosis (DISH). Interspinous arthrosis and facet arthropathy noted throughout the lumbar levels. Atherosclerotic calcification of the abdominal aorta. Remaining soft  tissues are unremarkable. Calcified pelvic phleboliths. IMPRESSION: 1. Age-indeterminate deformity involving the superior endplate L4 which could be related to acute injury or a broad-based Schmorl's node formation. Correlate for point tenderness and consider cross-sectional imaging which will be more sensitive and specific for subtle injury in this patient with diffuse bony demineralization and advanced degenerative change. 2. Multilevel features of discogenic, facet degenerative and interspinous arthrosis as above. 3.  Aortic Atherosclerosis (ICD10-I70.0). Electronically Signed   By: Lovena Le M.D.   On: 12/26/2019 21:06   CT Lumbar Spine Wo Contrast  Result Date: 12/26/2019 CLINICAL DATA:  Acute low back pain EXAM: CT LUMBAR SPINE WITHOUT CONTRAST TECHNIQUE: Multidetector CT imaging of the lumbar spine was performed without intravenous contrast administration. Multiplanar CT image reconstructions were also generated. COMPARISON:  None. FINDINGS: Segmentation: Standard Alignment: No static subluxation. Vertebrae: Acute incomplete burst fracture of L4 involving the anterior and posterior walls and the superior endplate. Minimal height loss and retropulsion. Chronic height loss of L5. Diffuse  osteopenia. Paraspinal and other soft tissues: Calcific aortic atherosclerosis. Incompletely visualized cystic lesions of the upper pelvis. Disc levels: No spinal canal stenosis.  No neural impingement. IMPRESSION: 1. Acute incomplete burst fracture of L4 involving the anterior and posterior walls and the superior endplate. Minimal height loss and retropulsion. 2. Cystic masses of the upper pelvis, incompletely visualized. Pelvic ultrasound or CT abdomen/pelvis recommended. 3. No spinal canal stenosis. 4. Chronic height loss of L5. Aortic Atherosclerosis (ICD10-I70.0). Electronically Signed   By: Ulyses Jarred M.D.   On: 12/26/2019 23:22   US PELVIS (TRANSABDOMINAL ONLY)  Result Date: 12/28/2019 CLINICAL DATA:   Cyst seen on CT, MR EXAM: TRANSABDOMINAL ULTRASOUND OF PELVIS TECHNIQUE: Transabdominal ultrasound examination of the pelvis was performed including evaluation of the uterus, ovaries, adnexal regions, and pelvic cul-de-sac. COMPARISON:  CT 12/27/2019 FINDINGS: Uterus Measurements: 6 x 2.9 x 3.9 cm = volume: 35 mL. Retroverted uterus. Atrophic appearance is quite typical for a patient of advanced age. Endometrium Thickness: 7.3 mm, top normal for patient age albeit with a small amount of endometrial fluid. Right ovary Not visualized on these transabdominal images. Left ovary Cystic lesion in the left adnexa measuring 5.8 x 5.4 x 5.2 cm with estimated volume of 81 mL. Normal ovarian tissue is not well visualized. No significant internal complexity. Other findings:  No abnormal free fluid. IMPRESSION: Redemonstration of a cystic lesion arising in the left adnexa without visualization of the normal ovarian parenchyma. Measures 5.8 cm on this examination. Consider GYN consult and followup US in 3-6 months, or pelvis MRI w/o and w/ contrast for improved characterization. Note: This recommendation does not apply to premenarchal patients or to those with increased risk (genetic, family history, elevated tumor markers or other high-risk factors) of ovarian cancer. Reference: Radiology 2019 Nov; 293(2):359-371. Top-normal endometrial thickness albeit with a small amount of fluid within the endometrial canal warranting further evaluation. Endometrial sampling should be considered to exclude carcinoma. Nonvisualization of the right ovary. Retroverted, atrophic uterus. Electronically Signed   By: Lovena Le M.D.   On: 12/28/2019 17:08   CT ABDOMEN PELVIS W CONTRAST  Result Date: 12/27/2019 CLINICAL DATA:  Cystic pelvic mass seen on recent lumbar spine CT. EXAM: CT ABDOMEN AND PELVIS WITH CONTRAST TECHNIQUE: Multidetector CT imaging of the abdomen and pelvis was performed using the standard protocol following bolus  administration of intravenous contrast. CONTRAST:  178m OMNIPAQUE IOHEXOL 300 MG/ML  SOLN COMPARISON:  Lumbar spine CT 12/26/2019. FINDINGS: Lower chest: Dependent atelectasis noted in the lower lungs bilaterally. Hepatobiliary: No suspicious focal abnormality within the liver parenchyma. Tiny rim calcified lesion along the posterior liver capsule likely sequelae of prior infection/inflammation. Gallbladder is surgically absent. No intrahepatic or extrahepatic biliary dilation. Pancreas: No focal mass lesion. No dilatation of the main duct. No intraparenchymal cyst. No peripancreatic edema. Spleen: No splenomegaly. No focal mass lesion. Adrenals/Urinary Tract: No adrenal nodule or mass. Kidneys unremarkable. No evidence for hydroureter. The urinary bladder appears normal for the degree of distention. Stomach/Bowel: Small hiatal hernia. Stomach otherwise unremarkable Duodenum is normally positioned as is the ligament of Treitz. No small bowel wall thickening. No small bowel dilatation. The terminal ileum is normal. The appendix is normal. No gross colonic mass. No colonic wall thickening. Diverticuli are seen scattered along the entire length of the colon without CT findings of diverticulitis. Vascular/Lymphatic: There is abdominal aortic atherosclerosis without aneurysm. There is no gastrohepatic or hepatoduodenal ligament lymphadenopathy. No retroperitoneal or mesenteric lymphadenopathy. No pelvic sidewall lymphadenopathy. Reproductive: The uterus is  unremarkable. No right adnexal mass. 6.7 x 6.2 x 6.0 cm homogeneous water density lesion is identified in the left adnexal space. The left gonadal vein tracks directly into the lateral margin of this finding suggesting left ovarian origin. No evidence for wall thickening, mural nodule, or internal architecture. Other: No intraperitoneal free fluid. Musculoskeletal: No worrisome lytic or sclerotic osseous abnormality. As noted on recent lumbar spine CT, there is an  acute L4 burst fracture, better characterized on the prior study. Chronic loss of vertebral body height noted at L5. IMPRESSION: 1. 6.7 x 6.2 x 6.0 cm benign appearing cystic lesion identified in the left ovary. Nonemergent pelvic ultrasound could be used to further evaluate as clinically warranted. This recommendation follows ACR consensus guidelines: White Paper of the ACR Incidental Findings Committee II on Adnexal Findings. J Am Coll Radiol 2013:10:675-681. 2. Small hiatal hernia. 3. Diffuse colonic diverticulosis without diverticulitis. 4. Aortic Atherosclerosis (ICD10-I70.0). Electronically Signed   By: Misty [place] M.D.   On: 12/27/2019 05:37    Microbiology: Recent Results (from the past 240 hour(s))  Respiratory Panel by RT PCR (Flu A&B, Covid) - Nasopharyngeal Swab     Status: None   Collection Time: 12/27/19 12:10 AM   Specimen: Nasopharyngeal Swab  Result Value Ref Range Status   SARS Coronavirus 2 by RT PCR NEGATIVE NEGATIVE Final    Comment: (NOTE) SARS-CoV-2 target nucleic acids are NOT DETECTED.  The SARS-CoV-2 RNA is generally detectable in upper respiratoy specimens during the acute phase of infection. The lowest concentration of SARS-CoV-2 viral copies this assay can detect is 131 copies/mL. A negative result does not preclude SARS-Cov-2 infection and should not be used as the sole basis for treatment or other patient management decisions. A negative result may occur with  improper specimen collection/handling, submission of specimen other than nasopharyngeal swab, presence of viral mutation(s) within the areas targeted by this assay, and inadequate number of viral copies (<131 copies/mL). A negative result must be combined with clinical observations, patient history, and epidemiological information. The expected result is Negative.  Fact Sheet for Patients:  PinkCheek.be  Fact Sheet for Healthcare Providers:    GravelBags.it  This test is no t yet approved or cleared by the [place] FDA and  has been authorized for detection and/or diagnosis of SARS-CoV-2 by FDA under an Emergency Use Authorization (EUA). This EUA will remain  in effect (meaning this test can be used) for the duration of the COVID-19 declaration under Section 564(b)(1) of the Act, 21 U.S.C. section 360bbb-3(b)(1), unless the authorization is terminated or revoked sooner.     Influenza A by PCR NEGATIVE NEGATIVE Final   Influenza B by PCR NEGATIVE NEGATIVE Final    Comment: (NOTE) The Xpert Xpress SARS-CoV-2/FLU/RSV assay is intended as an aid in  the diagnosis of influenza from Nasopharyngeal swab specimens and  should not be used as a sole basis for treatment. Nasal washings and  aspirates are unacceptable for Xpert Xpress SARS-CoV-2/FLU/RSV  testing.  Fact Sheet for Patients: PinkCheek.be  Fact Sheet for Healthcare Providers: GravelBags.it  This test is not yet approved or cleared by the [place] FDA and  has been authorized for detection and/or diagnosis of SARS-CoV-2 by  FDA under an Emergency Use Authorization (EUA). This EUA will remain  in effect (meaning this test can be used) for the duration of the  Covid-19 declaration under Section 564(b)(1) of the Act, 21  U.S.C. section 360bbb-3(b)(1), unless the authorization is  terminated or revoked. Performed at  [facility], [place] [address] [place], [place] [address]      Labs: Basic Metabolic Panel: Recent Labs  Lab 12/26/19 2137 12/27/19 0410  NA 139 136  K 3.6 4.5  CL 101 103  CO2  --  22  GLUCOSE 169* 127*  BUN 16 17  CREATININE 0.60 0.74  CALCIUM  --  8.8*   Liver Function Tests: No results for input(s): AST, ALT, ALKPHOS, BILITOT, PROT, ALBUMIN in the last 168 hours. No results for input(s): LIPASE, AMYLASE in the last 168  hours. No results for input(s): AMMONIA in the last 168 hours. CBC: Recent Labs  Lab 12/26/19 2122 12/26/19 2137 12/27/19 0640 12/28/19 0604 12/29/19 0606  WBC 15.4*  --  14.1* 12.1* 10.0  NEUTROABS 13.4*  --   --   --   --   HGB 13.5 14.3 13.2 12.5 12.2  HCT 41.7 42.0 41.3 39.9 39.3  MCV 79.1*  --  80.8 80.8 81.5  PLT 297  --  256 245 233   Cardiac Enzymes: No results for input(s): CKTOTAL, CKMB, CKMBINDEX, TROPONINI in the last 168 hours. BNP: BNP (last 3 results) No results for input(s): BNP in the last 8760 hours.  ProBNP (last 3 results) No results for input(s): PROBNP in the last 8760 hours.  CBG: Recent Labs  Lab 12/29/19 1153 12/29/19 1640 12/29/19 2030 12/30/19 0712 12/30/19 1151  GLUCAP 103* 78 228* 136* 112*       Signed:  Kayleen Memos, MD [facility] 12/30/2019, 2:25 PM

## 2019-12-30 NOTE — TOC Transition Note (Addendum)
Transition of Care [facility]) - Progression Note    Patient Details  Name: Lisa Castillo MRN: [id number] Date of Birth: 1928-11-30  Transition of Care [facility]) CM/SW [place], [place] Phone Number: [phone] 12/30/2019, 2:18 PM  Clinical Narrative:     Patient will d/c to [facility], Room 39N, [phone] ask for healthcare.  D/C summary faxed to [phone].  Attending and RN notified.  COVID test request made to Attending.  CSW will contact [facility] for transportation.  CSW updated Harris,Margaret (Daughter)  [phone]. TOC consult complete.   Expected Discharge Plan: [place] Barriers to Discharge: Continued Medical Work up  Expected Discharge Plan and Services Expected Discharge Plan: [place] In-house Referral: Clinical Social Work   Post Acute Care Choice: [place] Living arrangements for the past 2 months: [place]                 DME Arranged: N/A DME Agency: NA       HH Arranged: NA [place] Agency: NA         Social Determinants of Health (SDOH) Interventions    Readmission Risk Interventions No flowsheet data found.

## 2019-12-30 NOTE — Discharge Instructions (Signed)
Ovarian Cyst An ovarian cyst is a fluid-filled sac on an ovary. The ovaries are organs that make eggs in women. Most ovarian cysts go away on their own and are not cancerous (are benign). Some cysts need treatment. Follow these instructions at home:  Take over-the-counter and prescription medicines only as told by your doctor.  Do not drive or use heavy machinery while taking prescription pain medicine.  Get pelvic exams and Pap tests as often as told by your doctor.  Return to your normal activities as told by your doctor. Ask your doctor what activities are safe for you.  Do not use any products that contain nicotine or tobacco, such as cigarettes and e-cigarettes. If you need help quitting, ask your doctor.  Keep all follow-up visits as told by your doctor. This is important. Contact a doctor if:  Your periods are: ? Late. ? Irregular. ? Painful.   Your periods stop.  You have pelvic pain that does not go away.  You have pressure on your bladder.  You have trouble making your bladder empty when you pee (urinate).  You have pain during sex.  You have any of the following in your belly (abdomen): ? A feeling of fullness. ? Pressure. ? Discomfort. ? Pain that does not go away. ? Swelling.  You feel sick most of the time.  You have trouble pooping (have constipation).  You are not as hungry as usual (you lose your appetite).  You get very bad acne.  You start to have more hair on your body and face.  You are gaining weight or losing weight without changing your exercise and eating habits.  You think you may be pregnant. Get help right away if:  You have belly pain that is very bad or gets worse.  You cannot eat or drink without throwing up (vomiting).  You suddenly get a fever.  Your period is a lot heavier than usual. This information is not intended to replace advice given to you by your health care provider. Make sure you discuss any questions you have  with your health care provider. Document Revised: 01/28/2017 Document Reviewed: 07/20/2015 Elsevier Patient Education  2020 [employer].   Lumbar Spine Fracture A lumbar spine fracture is a break in one of the bones of the lower back. Lumbar spine fractures can vary from mild to severe. The most severe types are those that:  Cause the broken bones to move out of place (unstable).  Injure or press on the spinal cord. During recovery, it is normal to have pain and stiffness in the lower back for weeks. What are the causes? This condition may be caused by:  A fall.  A car accident.  A gunshot wound.  A hard, direct hit to the back. What increases the risk? You are more likely to develop this condition if:  You are in a situation that could result in a fall or other violent injury.  You have a condition that causes weakness in the bones (osteoporosis). What are the signs or symptoms? The main symptom of this condition is severe pain in the lower back. If a fracture is complex or severe, there may also be:  A misshapen or swollen area on the lower back.  Limited ability to move an area of the lower back.  Inability to empty the bladder (urinary retention).  Loss of bowel or bladder control (incontinence).  Loss of strength or sensation in the legs, feet, and toes.  Inability to move (  paralysis). How is this diagnosed? This condition is diagnosed based on:  A physical exam.  Symptoms and what happened just before they developed.  The results of imaging tests, such as an X-ray, CT scan, or MRI. If your nerves have been damaged, you may also have other tests to find out the extent of the damage. How is this treated? Treatment for this condition depends on how severe the injury is. Most fractures can be treated with:  A back brace.  Bed rest and activity restrictions.  Pain medicine.  Physical therapy. Fractures that are complex, involve multiple bones, or make  the spine unstable may require surgery. Surgery is done:  To remove pressure from the nerves or spinal cord.  To stabilize the broken pieces of bone. Follow these instructions at home: Medicines  Take over-the-counter and prescription medicines only as told by your health care provider.  Do not drive or use heavy machinery while taking prescription pain medicine.  If you are taking prescription pain medicine, take actions to prevent or treat constipation. Your health care provider may recommend that you: ? Drink enough fluid to keep your urine pale yellow. ? Eat foods that are high in fiber, such as fresh fruits and vegetables, whole grains, and beans. ? Limit foods that are high in fat and processed sugars, such as fried or sweet foods. ? Take an over-the-counter or prescription medicine for constipation. If you have a brace:  Wear the back brace as told by your health care provider. Remove it only as told by your health care provider.  Keep the brace clean.  If the brace is not waterproof: ? Do not let it get wet. ? Cover it with a watertight covering when you take a bath or a shower. Activity  Stay in bed (on bed rest) only as directed by your health care provider.  Do exercises to improve motion and strength in your back (physical therapy), if your health care provider tells you to do so.  Return to your normal activities as directed by your health care provider. Ask your health care provider what activities are safe for you. Managing pain, stiffness, and swelling   If directed, put ice on the injured area: ? If you have a removable brace, remove it as told by your health care provider. ? Put ice in a plastic bag. ? Place a towel between your skin and the bag. ? Leave the ice on for 20 minutes, 2-3 times a day. General instructions  Do not use any products that contain nicotine or tobacco, such as cigarettes and e-cigarettes. These can delay healing after injury. If you  need help quitting, ask your health care provider.  Do not drink alcohol. Alcohol can interfere with your treatment.  Keep all follow-up visits as directed by your health care provider. This is important. ? Failing to follow up as recommended could result in permanent injury, disability, or long-lasting (chronic) pain. Contact a health care provider if:  You have a fever.  Your pain medicine is not helping.  Your pain does not get better over time.  You cannot return to your normal activities as planned or expected. Get help right away if:  You have difficulty breathing.  Your pain is very bad and it suddenly gets worse.  You have numbness, tingling, or weakness in any part of your body.  You are unable to empty your bladder.  You cannot control your bladder or bowels.  You are unable to move any  body part (paralysis) that is below the level of your injury.  You vomit.  You have pain in your abdomen. Summary  A lumbar spine fracture is a break in one of the bones of the lower back.  The main symptom of this condition is severe pain in the lower back. If a fracture is complex, there may also be numbness, tingling, or paralysis in the legs.  Treatment depends on how severe the injury is. Most fractures can be treated with a back brace, bed rest and activity restrictions, pain medicine, and physical therapy.  Fractures that are complex, involve multiple bones, or make the spine unstable may require surgery. This information is not intended to replace advice given to you by your health care provider. Make sure you discuss any questions you have with your health care provider. Document Revised: 04/02/2017 Document Reviewed: 04/02/2017 Elsevier Patient Education  2020 [employer].

## 2019-12-30 NOTE — Progress Notes (Signed)
Attempted to call report at [phone], and again at 1815 using the phone number provided to me by the SW. I also called the main phone number. Each time I attempted the phone rang repeatedly and no one ever answered.

## 2019-12-30 NOTE — Progress Notes (Signed)
Patient has been discharged via [facility] with all of her belongings.

## 2019-12-31 ENCOUNTER — Non-Acute Institutional Stay (SKILLED_NURSING_FACILITY): Payer: Medicare Other | Admitting: Nurse Practitioner

## 2019-12-31 ENCOUNTER — Encounter: Payer: Self-pay | Admitting: Nurse Practitioner

## 2019-12-31 DIAGNOSIS — S32001A Stable burst fracture of unspecified lumbar vertebra, initial encounter for closed fracture: Secondary | ICD-10-CM | POA: Diagnosis not present

## 2019-12-31 DIAGNOSIS — E1169 Type 2 diabetes mellitus with other specified complication: Secondary | ICD-10-CM

## 2019-12-31 DIAGNOSIS — E038 Other specified hypothyroidism: Secondary | ICD-10-CM

## 2019-12-31 DIAGNOSIS — M8580 Other specified disorders of bone density and structure, unspecified site: Secondary | ICD-10-CM

## 2019-12-31 DIAGNOSIS — E669 Obesity, unspecified: Secondary | ICD-10-CM

## 2019-12-31 DIAGNOSIS — N838 Other noninflammatory disorders of ovary, fallopian tube and broad ligament: Secondary | ICD-10-CM

## 2019-12-31 DIAGNOSIS — E782 Mixed hyperlipidemia: Secondary | ICD-10-CM

## 2019-12-31 DIAGNOSIS — K5901 Slow transit constipation: Secondary | ICD-10-CM

## 2019-12-31 DIAGNOSIS — R269 Unspecified abnormalities of gait and mobility: Secondary | ICD-10-CM

## 2019-12-31 DIAGNOSIS — F418 Other specified anxiety disorders: Secondary | ICD-10-CM

## 2019-12-31 DIAGNOSIS — F0391 Unspecified dementia with behavioral disturbance: Secondary | ICD-10-CM

## 2019-12-31 NOTE — Assessment & Plan Note (Signed)
No BM today, continue Senokot S for now.

## 2019-12-31 NOTE — Assessment & Plan Note (Signed)
03/17/18 DEXA t score -1.8, continue Ca, Vit D

## 2019-12-31 NOTE — Assessment & Plan Note (Addendum)
Hospitalized 12/26/19-12/30/19  For L4 burst fracture of lumbar vertebra identified on CT 12/27/19 sustained from am unwitnessed fall admitted to [facility] for therapy, non surgical management, lumbar brace when up and walking, , f/u neurosurgery in 1-2 weeks, Tramadol for pain bid is not adequate, will have Tramadol 50mg  q6hr prn x 7 days. Observe.

## 2019-12-31 NOTE — Assessment & Plan Note (Signed)
Left ovarian cystic mass, had pelvic US, f/u Gyn

## 2019-12-31 NOTE — Assessment & Plan Note (Signed)
Hyperlipidemia, takes Atorvastain

## 2019-12-31 NOTE — Assessment & Plan Note (Signed)
T2DM, Hgb a1c 6.8 12/13/19, takes Glimepiride 20mg  qd.

## 2019-12-31 NOTE — Assessment & Plan Note (Signed)
Hypothyroidism, TSH 3.5 12/29/19, takes Levothyroxine

## 2019-12-31 NOTE — Assessment & Plan Note (Signed)
Depression/axniety, takes Sertraline.

## 2019-12-31 NOTE — Progress Notes (Signed)
Location:    [place] Room Number: 39 Place of Service:  SNF (31) Provider: Lennie Odor Dreydon Cardenas NP  [name], MD  Patient Care Team: [name], MD as PCP - General (Internal Medicine)  Extended Emergency Contact Information Primary Emergency Contact: Harris,Margaret Address: [place], [place] [address] [name] of [place] Phone: [phone] Mobile Phone: [phone] Relation: Daughter  Code Status:  DNR Goals of care: Advanced Directive information Advanced Directives 12/27/2019  Does Patient Have a Medical Advance Directive? Yes  Type of Advance Directive Living will  Does patient want to make changes to medical advance directive? No - Patient declined  Copy of [place] in Chart? -  Would patient like information on creating a medical advance directive? -     Chief Complaint  Patient presents with  . Acute Visit    HPI:  Pt is a 86 y.o. female seen today for an acute visit for medication review,  Hospitalized 12/26/19-12/30/19  For L4 burst fracture of lumbar vertebra identified on CT 12/27/19 sustained from am unwitnessed fall admitted to [facility] for therapy, non surgical management, lumbar brace when up and walking, , f/u neurosurgery in 1-2 weeks, Tramadol for pain  Hypothyroidism, TSH 3.5 12/29/19, takes Levothyroxine  T2DM, Hgb a1c 6.8 12/13/19, takes Glimepiride 58m qd.   Hyperlipidemia, takes Atorvastain  Depression/axniety, takes Sertraline.   Dementia with mild delirium,  stopped Memantine per Dtr's request due to dizziness as possible side effect, declined Seroquel  Gait abnormality, working with therapy.   Left ovarian cystic mass, had pelvic [place] f/u Gyn   Past Medical History:  Diagnosis Date  . Hearing loss   . History of fracture of clavicle   . Hypothyroidism   . Insomnia   . Tinnitus    Past Surgical History:  Procedure Laterality Date  . CHOLECYSTECTOMY  2007   Dr.  HVerdene Lennert . CLAVICLE EXCISION  1986  . MOHS SURGERY     past 10 years chest & legs    Allergies  Allergen Reactions  . Metformin And Related Diarrhea    Allergies as of 12/31/2019      Reactions   Metformin And Related Diarrhea      Medication List       Accurate as of December 31, 2019 11:59 PM. If you have any questions, ask your nurse or doctor.        atorvastatin 10 MG tablet Commonly known as: LIPITOR TAKE 1 TABLET BY MOUTH EVERY DAY   Blood Glucose Monitoring Suppl Kit Use to test blood sugar once daily daily. Dx: E11.69   Boost Glucose Control Liqd Take by mouth. 1 can three times a day between meals What changed: Another medication with the same name was removed. Continue taking this medication, and follow the directions you see here. Changed by: Izabellah Dadisman X Samanthajo Payano, NP   calcium carbonate 1500 (600 Ca) MG Tabs tablet Commonly known as: OSCAL Take 600 mg of elemental calcium by mouth daily.   glimepiride 2 MG tablet Commonly known as: AMARYL TAKE 1 TABLET BY MOUTH EVERY DAY BEFORE BREAKFAST What changed: See the new instructions.   glucose blood test strip Use to test blood sugar once daily. Dx: E11.69   Lancets 30G Misc Use to test blood sugar once daily. Dx: E11.69   Lancets Misc. Kit 1 Device by Does not apply route daily. Use as directed  levothyroxine 75 MCG tablet Commonly known as: SYNTHROID TAKE 1 TABLET (75 MCG TOTAL) BY MOUTH DAILY BEFORE BREAKFAST.   Omega-3 1400 MG Caps Take 1 capsule by mouth daily.   senna-docusate 8.6-50 MG tablet Commonly known as: Senokot-S Take 2 tablets by mouth 2 (two) times daily for 7 days.   sertraline 25 MG tablet Commonly known as: ZOLOFT TAKE 1 TABLET BY MOUTH EVERY DAY   traMADol 50 MG tablet Commonly known as: ULTRAM Take 1 tablet (50 mg total) by mouth every 12 (twelve) hours for 3 days.   traMADol 50 MG tablet Commonly known as: ULTRAM Take 50 mg by mouth every 6 (six) hours as needed.   Vitamin D3  50 MCG (2000 UT) Tabs Take 1 tablet by mouth daily.   zinc oxide 20 % ointment Apply 1 application topically as needed for irritation.       Review of Systems  Constitutional: Positive for activity change, appetite change and fatigue. Negative for fever.  HENT: Positive for hearing loss. Negative for congestion and voice change.   Eyes: Negative for visual disturbance.  Respiratory: Negative for cough, shortness of breath and wheezing.   Cardiovascular: Negative for chest pain, palpitations and leg swelling.  Gastrointestinal: Positive for constipation. Negative for abdominal distention, nausea and vomiting.  Genitourinary: Negative for difficulty urinating, dysuria and urgency.  Musculoskeletal: Positive for arthralgias, back pain and gait problem.  Skin: Negative for color change.  Neurological: Negative for speech difficulty, weakness, light-headedness and headaches.       Confused.   Psychiatric/Behavioral: Positive for confusion. Negative for agitation, behavioral problems, hallucinations and sleep disturbance. The patient is not nervous/anxious.     Immunization History  Administered Date(s) Administered  . DTaP 07/20/2013  . Influenza, High Dose Seasonal PF 12/07/2016  . Influenza,inj,Quad PF,6+ Mos 12/01/2017  . Influenza-Unspecified 11/20/2014, 12/12/2015, 11/20/2019  . Moderna SARS-COVID-2 Vaccination 03/05/2019, 04/02/2019  . PPD Test 08/06/2014  . Pneumococcal Conjugate-13 11/07/2013  . Pneumococcal Polysaccharide-23 03/22/2017  . Zoster 03/01/2006  . Zoster Recombinat (Shingrix) 06/07/2017, 08/26/2017   Pertinent  Health Maintenance Due  Topic Date Due  . OPHTHALMOLOGY EXAM  Never done  . URINE MICROALBUMIN  11/24/2018  . HEMOGLOBIN A1C  06/12/2020  . FOOT EXAM  07/10/2020  . INFLUENZA VACCINE  Completed  . DEXA SCAN  Completed  . PNA vac Low Risk Adult  Completed   Fall Risk  12/19/2019 10/17/2019 08/22/2019 07/11/2019 06/13/2019  Falls in the past year? 0 0  0 0 0  Number falls in past yr: 0 0 0 0 0  Injury with Fall? - - 0 - -   Functional Status Survey:    Vitals:   01/01/20 1027  BP: (!) 144/88  Pulse: 100  Resp: 20  Temp: (!) 97.3 F (36.3 C)  SpO2: 90%  Weight: 157 lb (71.2 kg)  Height: 5' (1.524 m)   Body mass index is 30.66 kg/m. Physical Exam Vitals and nursing note reviewed.  Constitutional:      General: She is not in acute distress.    Appearance: She is not ill-appearing.     Comments: She seems tired but stayed awake during today's examination.   HENT:     Head: Normocephalic and atraumatic.     Nose: Nose normal.     Mouth/Throat:     Mouth: Mucous membranes are moist.  Eyes:     Extraocular Movements: Extraocular movements intact.     Conjunctiva/sclera: Conjunctivae normal.     Pupils: Pupils  are equal, round, and reactive to light.  Cardiovascular:     Rate and Rhythm: Normal rate and regular rhythm.     Heart sounds: No murmur heard.   Pulmonary:     Effort: Pulmonary effort is normal.     Breath sounds: No wheezing or rales.  Abdominal:     General: There is no distension.     Palpations: Abdomen is soft.     Tenderness: There is no abdominal tenderness. There is no guarding or rebound.     Comments: Protruding abd.   Musculoskeletal:        General: Tenderness present.     Cervical back: Normal range of motion and neck supple.     Right lower leg: No edema.     Left lower leg: No edema.     Comments: Lower back   Skin:    General: Skin is warm and dry.  Neurological:     General: No focal deficit present.     Mental Status: She is alert. Mental status is at baseline.     Motor: No weakness.     Coordination: Coordination abnormal.     Gait: Gait abnormal.     Comments: Oriented to person, followed simple directions.   Psychiatric:        Mood and Affect: Mood normal.        Behavior: Behavior normal.     Labs reviewed: Recent Labs    08/16/19 0800 08/16/19 0800 12/13/19 0000  12/26/19 2137 12/27/19 0410  NA 138   < > 141 139 136  K 4.6   < > 4.3 3.6 4.5  CL 104   < > 104 101 103  CO2 26  --  27  --  22  GLUCOSE 181*   < > 130* 169* 127*  BUN 18   < > _0 CREATININE 0.76   < > 0.78 0.60 0.74  CALCIUM 8.9  --  9.0  --  8.8*   < > = values in this interval not displayed.   Recent Labs    04/04/19 0928 08/16/19 0800 12/13/19 0000  AST _1 ALT _2 BILITOT 0.5 0.4 0.4  PROT 6.4 6.4 6.5   Recent Labs    08/16/19 0800 08/16/19 0800 12/13/19 0000 12/13/19 0000 12/26/19 2122 12/26/19 2137 12/27/19 0640 12/28/19 0604 12/29/19 0606  WBC 10.4   < > 9.6   < > 15.4*   < > 14.1* 12.1* 10.0  NEUTROABS 6,677  --  5,846  --  13.4*  --   --   --   --   HGB 12.9   < > 12.6   < > 13.5   < > 13.2 12.5 12.2  HCT 39.7   < > 39.7   < > 41.7   < > 41.3 39.9 39.3  MCV 79.4*   < > 80.2   < > 79.1*   < > 80.8 80.8 81.5  PLT 328   < > 318   < > 297   < > 256 245 233   < > = values in this interval not displayed.   Lab Results  Component Value Date   TSH 3.509 12/29/2019   Lab Results  Component Value Date   HGBA1C 6.8 (H) 12/13/2019   Lab Results  Component Value Date   CHOL 109 12/13/2019   HDL 36 (L) 12/13/2019   LDLCALC 51 12/13/2019   TRIG  132 12/13/2019   CHOLHDL 3.0 12/13/2019    Significant Diagnostic Results in last 30 days:  DG Lumbar Spine Complete  Result Date: 12/26/2019 CLINICAL DATA:  Unwitnessed fall, low back pain EXAM: LUMBAR SPINE - COMPLETE 4+ VIEW COMPARISON:  None. FINDINGS: Five lumbar vertebrae. The osseous structures appear diffusely demineralized which may limit detection of small or nondisplaced fractures. Age-indeterminate deformity involving the superior endplate L4 which could be related to a broad-based Schmorl's node formation no acute fracture is not excluded particularly given some cortical step-off anteriorly. No other acute fracture or height loss is evident. Multilevel degenerative changes are present  in the imaged portions of the spine. Lower thoracic flowing anterior osteophytosis, could suggest diffuse idiopathic skeletal hyperostosis (DISH). Interspinous arthrosis and facet arthropathy noted throughout the lumbar levels. Atherosclerotic calcification of the abdominal aorta. Remaining soft tissues are unremarkable. Calcified pelvic phleboliths. IMPRESSION: 1. Age-indeterminate deformity involving the superior endplate L4 which could be related to acute injury or a broad-based Schmorl's node formation. Correlate for point tenderness and consider cross-sectional imaging which will be more sensitive and specific for subtle injury in this patient with diffuse bony demineralization and advanced degenerative change. 2. Multilevel features of discogenic, facet degenerative and interspinous arthrosis as above. 3.  Aortic Atherosclerosis (ICD10-I70.0). Electronically Signed   By: Lovena Le M.D.   On: 12/26/2019 21:06   CT Lumbar Spine Wo Contrast  Result Date: 12/26/2019 CLINICAL DATA:  Acute low back pain EXAM: CT LUMBAR SPINE WITHOUT CONTRAST TECHNIQUE: Multidetector CT imaging of the lumbar spine was performed without intravenous contrast administration. Multiplanar CT image reconstructions were also generated. COMPARISON:  None. FINDINGS: Segmentation: Standard Alignment: No static subluxation. Vertebrae: Acute incomplete burst fracture of L4 involving the anterior and posterior walls and the superior endplate. Minimal height loss and retropulsion. Chronic height loss of L5. Diffuse osteopenia. Paraspinal and other soft tissues: Calcific aortic atherosclerosis. Incompletely visualized cystic lesions of the upper pelvis. Disc levels: No spinal canal stenosis.  No neural impingement. IMPRESSION: 1. Acute incomplete burst fracture of L4 involving the anterior and posterior walls and the superior endplate. Minimal height loss and retropulsion. 2. Cystic masses of the upper pelvis, incompletely visualized.  Pelvic ultrasound or CT abdomen/pelvis recommended. 3. No spinal canal stenosis. 4. Chronic height loss of L5. Aortic Atherosclerosis (ICD10-I70.0). Electronically Signed   By: Ulyses Jarred M.D.   On: 12/26/2019 23:22   US PELVIS (TRANSABDOMINAL ONLY)  Result Date: 12/28/2019 CLINICAL DATA:  Cyst seen on CT, MR EXAM: TRANSABDOMINAL ULTRASOUND OF PELVIS TECHNIQUE: Transabdominal ultrasound examination of the pelvis was performed including evaluation of the uterus, ovaries, adnexal regions, and pelvic cul-de-sac. COMPARISON:  CT 12/27/2019 FINDINGS: Uterus Measurements: 6 x 2.9 x 3.9 cm = volume: 35 mL. Retroverted uterus. Atrophic appearance is quite typical for a patient of advanced age. Endometrium Thickness: 7.3 mm, top normal for patient age albeit with a small amount of endometrial fluid. Right ovary Not visualized on these transabdominal images. Left ovary Cystic lesion in the left adnexa measuring 5.8 x 5.4 x 5.2 cm with estimated volume of 81 mL. Normal ovarian tissue is not well visualized. No significant internal complexity. Other findings:  No abnormal free fluid. IMPRESSION: Redemonstration of a cystic lesion arising in the left adnexa without visualization of the normal ovarian parenchyma. Measures 5.8 cm on this examination. Consider GYN consult and followup US in 3-6 months, or pelvis MRI w/o and w/ contrast for improved characterization. Note: This recommendation does not apply to premenarchal patients or to  those with increased risk (genetic, family history, elevated tumor markers or other high-risk factors) of ovarian cancer. Reference: Radiology 2019 Nov; 293(2):359-371. Top-normal endometrial thickness albeit with a small amount of fluid within the endometrial canal warranting further evaluation. Endometrial sampling should be considered to exclude carcinoma. Nonvisualization of the right ovary. Retroverted, atrophic uterus. Electronically Signed   By: Lovena Le M.D.   On: 12/28/2019  17:08   CT ABDOMEN PELVIS W CONTRAST  Result Date: 12/27/2019 CLINICAL DATA:  Cystic pelvic mass seen on recent lumbar spine CT. EXAM: CT ABDOMEN AND PELVIS WITH CONTRAST TECHNIQUE: Multidetector CT imaging of the abdomen and pelvis was performed using the standard protocol following bolus administration of intravenous contrast. CONTRAST:  148mL OMNIPAQUE IOHEXOL 300 MG/ML  SOLN COMPARISON:  Lumbar spine CT 12/26/2019. FINDINGS: Lower chest: Dependent atelectasis noted in the lower lungs bilaterally. Hepatobiliary: No suspicious focal abnormality within the liver parenchyma. Tiny rim calcified lesion along the posterior liver capsule likely sequelae of prior infection/inflammation. Gallbladder is surgically absent. No intrahepatic or extrahepatic biliary dilation. Pancreas: No focal mass lesion. No dilatation of the main duct. No intraparenchymal cyst. No peripancreatic edema. Spleen: No splenomegaly. No focal mass lesion. Adrenals/Urinary Tract: No adrenal nodule or mass. Kidneys unremarkable. No evidence for hydroureter. The urinary bladder appears normal for the degree of distention. Stomach/Bowel: Small hiatal hernia. Stomach otherwise unremarkable Duodenum is normally positioned as is the ligament of Treitz. No small bowel wall thickening. No small bowel dilatation. The terminal ileum is normal. The appendix is normal. No gross colonic mass. No colonic wall thickening. Diverticuli are seen scattered along the entire length of the colon without CT findings of diverticulitis. Vascular/Lymphatic: There is abdominal aortic atherosclerosis without aneurysm. There is no gastrohepatic or hepatoduodenal ligament lymphadenopathy. No retroperitoneal or mesenteric lymphadenopathy. No pelvic sidewall lymphadenopathy. Reproductive: The uterus is unremarkable. No right adnexal mass. 6.7 x 6.2 x 6.0 cm homogeneous water density lesion is identified in the left adnexal space. The left gonadal vein tracks directly into the  lateral margin of this finding suggesting left ovarian origin. No evidence for wall thickening, mural nodule, or internal architecture. Other: No intraperitoneal free fluid. Musculoskeletal: No worrisome lytic or sclerotic osseous abnormality. As noted on recent lumbar spine CT, there is an acute L4 burst fracture, better characterized on the prior study. Chronic loss of vertebral body height noted at L5. IMPRESSION: 1. 6.7 x 6.2 x 6.0 cm benign appearing cystic lesion identified in the left ovary. Nonemergent pelvic ultrasound could be used to further evaluate as clinically warranted. This recommendation follows ACR consensus guidelines: White Paper of the ACR Incidental Findings Committee II on Adnexal Findings. J Am Coll Radiol 2013:10:675-681. 2. Small hiatal hernia. 3. Diffuse colonic diverticulosis without diverticulitis. 4. Aortic Atherosclerosis (ICD10-I70.0). Electronically Signed   By: Misty Stanley M.D.   On: 12/27/2019 05:37    Assessment/Plan Burst fracture of lumbar vertebra, closed, initial encounter ([place]) Hospitalized 12/26/19-12/30/19  For L4 burst fracture of lumbar vertebra identified on CT 12/27/19 sustained from am unwitnessed fall admitted to [facility] for therapy, non surgical management, lumbar brace when up and walking, , f/u neurosurgery in 1-2 weeks, Tramadol for pain bid is not adequate, will have Tramadol $RemoveBefor'50mg'[id number]$  q6hr prn x 7 days. Observe.    Hypothyroidism Hypothyroidism, TSH 3.5 12/29/19, takes Levothyroxine   Diabetes mellitus type 2 in obese (HCC) T2DM, Hgb a1c 6.8 12/13/19, takes Glimepiride $RemoveBeforeDEI'20mg'[id number]$  qd.   Mixed hyperlipidemia Hyperlipidemia, takes Atorvastain  Depression with anxiety Depression/axniety, takes Sertraline.  Dementia ([place]) Dementia with mild delirium,  stopped Memantine per Dtr's request due to dizziness as possible side effect, declined Seroquel  Gait abnormality Gait abnormality, working with therapy.    Left tubo-ovarian mass Left ovarian  cystic mass, had pelvic US, f/u Gyn   Osteopenia 03/17/18 DEXA t score -1.8, continue Ca, Vit D  Slow transit constipation No BM today, continue Senokot S for now.     Family/ staff Communication: plan of care reviewed with the patient and charge nurs.e   Labs/tests ordered: CBC/diff, CMP/eGFR  Time spend 35 minutes.

## 2019-12-31 NOTE — Assessment & Plan Note (Signed)
Dementia with mild delirium,  stopped Memantine per Dtr's request due to dizziness as possible side effect, declined Seroquel

## 2019-12-31 NOTE — Assessment & Plan Note (Signed)
Gait abnormality, working with therapy.

## 2020-01-01 ENCOUNTER — Encounter: Payer: Self-pay | Admitting: Nurse Practitioner

## 2020-01-02 ENCOUNTER — Encounter: Payer: Self-pay | Admitting: Internal Medicine

## 2020-01-02 ENCOUNTER — Non-Acute Institutional Stay (SKILLED_NURSING_FACILITY): Payer: Medicare Other | Admitting: Internal Medicine

## 2020-01-02 DIAGNOSIS — S32001A Stable burst fracture of unspecified lumbar vertebra, initial encounter for closed fracture: Secondary | ICD-10-CM | POA: Diagnosis not present

## 2020-01-02 DIAGNOSIS — E1169 Type 2 diabetes mellitus with other specified complication: Secondary | ICD-10-CM | POA: Diagnosis not present

## 2020-01-02 DIAGNOSIS — F418 Other specified anxiety disorders: Secondary | ICD-10-CM | POA: Diagnosis not present

## 2020-01-02 DIAGNOSIS — E782 Mixed hyperlipidemia: Secondary | ICD-10-CM

## 2020-01-02 DIAGNOSIS — E669 Obesity, unspecified: Secondary | ICD-10-CM

## 2020-01-02 DIAGNOSIS — F0391 Unspecified dementia with behavioral disturbance: Secondary | ICD-10-CM

## 2020-01-02 DIAGNOSIS — E038 Other specified hypothyroidism: Secondary | ICD-10-CM

## 2020-01-02 NOTE — Progress Notes (Signed)
Provider:  Veleta Miners MD Location:    [place] Room Number: [address] Place of Service:  SNF (31)  PCP: [name], MD Patient Care Team: [name], MD as PCP - General (Internal Medicine)  Extended Emergency Contact Information Primary Emergency Contact: Harris,Margaret Address: [address]          [place],  [address] [name] of [place] Phone: [phone] Mobile Phone: [phone] Relation: Daughter  Code Status: Full Code Goals of Care: Advanced Directive information Advanced Directives 01/02/2020  Does Patient Have a Medical Advance Directive? Yes  Type of Advance Directive Living will;Healthcare Power of Attorney  Does patient want to make changes to medical advance directive? No - Patient declined  Copy of [place] in Chart? Yes - validated most recent copy scanned in chart (See row information)  Would patient like information on creating a medical advance directive? -      Chief Complaint  Patient presents with  . New Admit To SNF    HPI: [name] a 86 y.o. female seen today for admission to SNF for therapy  Patient was admitted in the hospital from 10/ 27-10/ 31 with L4 burst fracture after the fall  Patient has a history of type 2 diabetes, hyperlipidemia, depression, dementia, hypothyroidism, osteopenia, stress and urge urinary incontinence  Patient was unable to give me much history.  But per her daughter and hospital records patient fell in her apartment.  Daughter said that patient denies any dizziness.  Was on the floor and could not get up. Was found to have L4 burst fracture Pain was controlled with [facility] neurosurgery recommended brace and nonsurgical management Patient now is in SNF for therapy.   She seems confused and very guarded about moving due to pain But her daughter does not want narcotics due to worsening in her confusion. Therapy has started working with her but pain seems  to be the hindrance At baseline patient has mild cognitive deficit.  Usually walks without any cane or walker Past Medical History:  Diagnosis Date  . Hearing loss   . History of fracture of clavicle   . Hypothyroidism   . Insomnia   . Tinnitus    Past Surgical History:  Procedure Laterality Date  . CHOLECYSTECTOMY  2007   Dr. Verdene Lennert  . CLAVICLE EXCISION  1986  . MOHS SURGERY     past 10 years chest & legs    reports that she has never smoked. She has never used smokeless tobacco. She reports current alcohol use. She reports that she does not use drugs. Social History   Socioeconomic History  . Marital status: Widowed    Spouse name: Not on file  . Number of children: Not on file  . Years of education: Not on file  . Highest education level: Not on file  Occupational History  . Occupation: retired Web designer  Tobacco Use  . Smoking status: Never Smoker  . Smokeless tobacco: Never Used  Vaping Use  . Vaping Use: Never used  Substance and Sexual Activity  . Alcohol use: Yes    Comment: 4 per week  . Drug use: No  . Sexual activity: Never  Other Topics Concern  . Not on file  Social History Narrative   [facility] at [facility] since 12/23/14   Widowed   Never smoked   Alcohol 5 per week   Exercise 3 times a week classes, walking.    Activities  Bridge, book groups   POA, Living Will   Still driving      Social Determinants of Health   Financial Resource Strain:   . Difficulty of Paying Living Expenses: Not on file  Food Insecurity:   . Worried About Charity fundraiser in the Last Year: Not on file  . Ran Out of Food in the Last Year: Not on file  Transportation Needs:   . Lack of Transportation (Medical): Not on file  . Lack of Transportation (Non-Medical): Not on file  Physical Activity:   . Days of Exercise per Week: Not on file  . Minutes of Exercise per Session: Not on file  Stress:   . Feeling of Stress : Not on file  Social Connections:   .  Frequency of Communication with Friends and Family: Not on file  . Frequency of Social Gatherings with Friends and Family: Not on file  . Attends Religious Services: Not on file  . Active Member of Clubs or Organizations: Not on file  . Attends Archivist Meetings: Not on file  . Marital Status: Not on file  Intimate Partner Violence:   . Fear of Current or Ex-Partner: Not on file  . Emotionally Abused: Not on file  . Physically Abused: Not on file  . Sexually Abused: Not on file    Functional Status Survey:    Family History  Problem Relation Age of Onset  . Heart disease Mother   . Heart disease Father     Health Maintenance  Topic Date Due  . OPHTHALMOLOGY EXAM  Never done  . URINE MICROALBUMIN  11/24/2018  . TETANUS/TDAP  11/24/2026 (Originally 08/19/1947)  . HEMOGLOBIN A1C  06/12/2020  . FOOT EXAM  07/10/2020  . INFLUENZA VACCINE  Completed  . DEXA SCAN  Completed  . COVID-19 Vaccine  Completed  . PNA vac Low Risk Adult  Completed    Allergies  Allergen Reactions  . Metformin And Related Diarrhea    Allergies as of 01/02/2020      Reactions   Metformin And Related Diarrhea      Medication List       Accurate as of January 02, 2020  8:47 AM. If you have any questions, ask your nurse or doctor.        STOP taking these medications   calcium carbonate 1500 (600 Ca) MG Tabs tablet Commonly known as: OSCAL Stopped by: [name], MD   Omega-3 1400 MG Caps Stopped by: [name], MD     TAKE these medications   atorvastatin 10 MG tablet Commonly known as: LIPITOR TAKE 1 TABLET BY MOUTH EVERY DAY   Blood Glucose Monitoring Suppl Kit Use to test blood sugar once daily daily. Dx: E11.69   Boost Glucose Control Liqd Take by mouth. 1 can three times a day between meals   glimepiride 2 MG tablet Commonly known as: AMARYL TAKE 1 TABLET BY MOUTH EVERY DAY BEFORE BREAKFAST What changed: See the new instructions.   glucose blood test  strip Use to test blood sugar once daily. Dx: E11.69   Lancets 30G Misc Use to test blood sugar once daily. Dx: E11.69   Lancets Misc. Kit 1 Device by Does not apply route daily. Use as directed   levothyroxine 75 MCG tablet Commonly known as: SYNTHROID TAKE 1 TABLET (75 MCG TOTAL) BY MOUTH DAILY BEFORE BREAKFAST.   senna-docusate 8.6-50 MG tablet Commonly known as: Senokot-S Take 2 tablets by mouth 2 (two)  times daily for 7 days.   sertraline 25 MG tablet Commonly known as: ZOLOFT TAKE 1 TABLET BY MOUTH EVERY DAY   traMADol 50 MG tablet Commonly known as: ULTRAM Take 1 tablet (50 mg total) by mouth every 12 (twelve) hours for 3 days.   traMADol 50 MG tablet Commonly known as: ULTRAM Take 50 mg by mouth every 6 (six) hours as needed.   Vitamin D3 50 MCG (2000 UT) Tabs Take 1 tablet by mouth daily.   zinc oxide 20 % ointment Apply 1 application topically as needed for irritation.       Review of Systems  Vitals:   01/02/20 0839  BP: (!) 144/77  Pulse: (!) 104  Resp: 12  Temp: (!) 97.3 F (36.3 C)  SpO2: 92%  Weight: 157 lb (71.2 kg)  Height: 5' (1.524 m)   Body mass index is 30.66 kg/m. Physical Exam Vitals reviewed.  Constitutional:      Appearance: Normal appearance.  HENT:     Head: Normocephalic.     Nose: Nose normal.     Mouth/Throat:     Mouth: Mucous membranes are moist.     Pharynx: Oropharynx is clear.  Eyes:     Pupils: Pupils are equal, round, and reactive to light.  Cardiovascular:     Rate and Rhythm: Normal rate and regular rhythm.     Pulses: Normal pulses.     Heart sounds: Normal heart sounds.  Pulmonary:     Effort: Pulmonary effort is normal.     Breath sounds: Normal breath sounds.  Abdominal:     General: Abdomen is flat. Bowel sounds are normal.     Palpations: Abdomen is soft.  Musculoskeletal:        General: No swelling.     Cervical back: Neck supple.  Skin:    General: Skin is warm.  Neurological:     General:  No focal deficit present.     Mental Status: She is alert.     Comments: Very Confused More then her baseline  Psychiatric:        Mood and Affect: Mood normal.     Comments: Very Anxious     Labs reviewed: Basic Metabolic Panel: Recent Labs    08/16/19 0800 08/16/19 0800 12/13/19 0000 12/26/19 2137 12/27/19 0410  NA 138   < > 141 139 136  K 4.6   < > 4.3 3.6 4.5  CL 104   < > 104 101 103  CO2 26  --  27  --  22  GLUCOSE 181*   < > 130* 169* 127*  BUN 18   < > '16 16 17  ' CREATININE 0.76   < > 0.78 0.60 0.74  CALCIUM 8.9  --  9.0  --  8.8*   < > = values in this interval not displayed.   Liver Function Tests: Recent Labs    04/04/19 0928 08/16/19 0800 12/13/19 0000  AST '19 14 15  ' ALT '25 14 11  ' BILITOT 0.5 0.4 0.4  PROT 6.4 6.4 6.5   No results for input(s): LIPASE, AMYLASE in the last 8760 hours. No results for input(s): AMMONIA in the last 8760 hours. CBC: Recent Labs    08/16/19 0800 08/16/19 0800 12/13/19 0000 12/13/19 0000 12/26/19 2122 12/26/19 2137 12/27/19 0640 12/28/19 0604 12/29/19 0606  WBC 10.4   < > 9.6   < > 15.4*   < > 14.1* 12.1* 10.0  NEUTROABS 6,677  --  5,846  --  13.4*  --   --   --   --   HGB 12.9   < > 12.6   < > 13.5   < > 13.2 12.5 12.2  HCT 39.7   < > 39.7   < > 41.7   < > 41.3 39.9 39.3  MCV 79.4*   < > 80.2   < > 79.1*   < > 80.8 80.8 81.5  PLT 328   < > 318   < > 297   < > 256 245 233   < > = values in this interval not displayed.   Cardiac Enzymes: No results for input(s): CKTOTAL, CKMB, CKMBINDEX, TROPONINI in the last 8760 hours. BNP: Invalid input(s): POCBNP Lab Results  Component Value Date   HGBA1C 6.8 (H) 12/13/2019   Lab Results  Component Value Date   TSH 3.509 12/29/2019   No results found for: VITAMINB12 No results found for: FOLATE No results found for: IRON, TIBC, FERRITIN  Imaging and Procedures obtained prior to SNF admission: DG Lumbar Spine Complete  Result Date: 12/26/2019 CLINICAL DATA:   Unwitnessed fall, low back pain EXAM: LUMBAR SPINE - COMPLETE 4+ VIEW COMPARISON:  None. FINDINGS: Five lumbar vertebrae. The osseous structures appear diffusely demineralized which may limit detection of small or nondisplaced fractures. Age-indeterminate deformity involving the superior endplate L4 which could be related to a broad-based Schmorl's node formation no acute fracture is not excluded particularly given some cortical step-off anteriorly. No other acute fracture or height loss is evident. Multilevel degenerative changes are present in the imaged portions of the spine. Lower thoracic flowing anterior osteophytosis, could suggest diffuse idiopathic skeletal hyperostosis (DISH). Interspinous arthrosis and facet arthropathy noted throughout the lumbar levels. Atherosclerotic calcification of the abdominal aorta. Remaining soft tissues are unremarkable. Calcified pelvic phleboliths. IMPRESSION: 1. Age-indeterminate deformity involving the superior endplate L4 which could be related to acute injury or a broad-based Schmorl's node formation. Correlate for point tenderness and consider cross-sectional imaging which will be more sensitive and specific for subtle injury in this patient with diffuse bony demineralization and advanced degenerative change. 2. Multilevel features of discogenic, facet degenerative and interspinous arthrosis as above. 3.  Aortic Atherosclerosis (ICD10-I70.0). Electronically Signed   By: Lovena Le M.D.   On: 12/26/2019 21:06   CT Lumbar Spine Wo Contrast  Result Date: 12/26/2019 CLINICAL DATA:  Acute low back pain EXAM: CT LUMBAR SPINE WITHOUT CONTRAST TECHNIQUE: Multidetector CT imaging of the lumbar spine was performed without intravenous contrast administration. Multiplanar CT image reconstructions were also generated. COMPARISON:  None. FINDINGS: Segmentation: Standard Alignment: No static subluxation. Vertebrae: Acute incomplete burst fracture of L4 involving the anterior and  posterior walls and the superior endplate. Minimal height loss and retropulsion. Chronic height loss of L5. Diffuse osteopenia. Paraspinal and other soft tissues: Calcific aortic atherosclerosis. Incompletely visualized cystic lesions of the upper pelvis. Disc levels: No spinal canal stenosis.  No neural impingement. IMPRESSION: 1. Acute incomplete burst fracture of L4 involving the anterior and posterior walls and the superior endplate. Minimal height loss and retropulsion. 2. Cystic masses of the upper pelvis, incompletely visualized. Pelvic ultrasound or CT abdomen/pelvis recommended. 3. No spinal canal stenosis. 4. Chronic height loss of L5. Aortic Atherosclerosis (ICD10-I70.0). Electronically Signed   By: Ulyses Jarred M.D.   On: 12/26/2019 23:22   CT ABDOMEN PELVIS W CONTRAST  Result Date: 12/27/2019 CLINICAL DATA:  Cystic pelvic mass seen on recent lumbar spine CT. EXAM: CT ABDOMEN AND PELVIS WITH CONTRAST TECHNIQUE:  Multidetector CT imaging of the abdomen and pelvis was performed using the standard protocol following bolus administration of intravenous contrast. CONTRAST:  174m OMNIPAQUE IOHEXOL 300 MG/ML  SOLN COMPARISON:  Lumbar spine CT 12/26/2019. FINDINGS: Lower chest: Dependent atelectasis noted in the lower lungs bilaterally. Hepatobiliary: No suspicious focal abnormality within the liver parenchyma. Tiny rim calcified lesion along the posterior liver capsule likely sequelae of prior infection/inflammation. Gallbladder is surgically absent. No intrahepatic or extrahepatic biliary dilation. Pancreas: No focal mass lesion. No dilatation of the main duct. No intraparenchymal cyst. No peripancreatic edema. Spleen: No splenomegaly. No focal mass lesion. Adrenals/Urinary Tract: No adrenal nodule or mass. Kidneys unremarkable. No evidence for hydroureter. The urinary bladder appears normal for the degree of distention. Stomach/Bowel: Small hiatal hernia. Stomach otherwise unremarkable Duodenum is  normally positioned as is the ligament of Treitz. No small bowel wall thickening. No small bowel dilatation. The terminal ileum is normal. The appendix is normal. No gross colonic mass. No colonic wall thickening. Diverticuli are seen scattered along the entire length of the colon without CT findings of diverticulitis. Vascular/Lymphatic: There is abdominal aortic atherosclerosis without aneurysm. There is no gastrohepatic or hepatoduodenal ligament lymphadenopathy. No retroperitoneal or mesenteric lymphadenopathy. No pelvic sidewall lymphadenopathy. Reproductive: The uterus is unremarkable. No right adnexal mass. 6.7 x 6.2 x 6.0 cm homogeneous water density lesion is identified in the left adnexal space. The left gonadal vein tracks directly into the lateral margin of this finding suggesting left ovarian origin. No evidence for wall thickening, mural nodule, or internal architecture. Other: No intraperitoneal free fluid. Musculoskeletal: No worrisome lytic or sclerotic osseous abnormality. As noted on recent lumbar spine CT, there is an acute L4 burst fracture, better characterized on the prior study. Chronic loss of vertebral body height noted at L5. IMPRESSION: 1. 6.7 x 6.2 x 6.0 cm benign appearing cystic lesion identified in the left ovary. Nonemergent pelvic ultrasound could be used to further evaluate as clinically warranted. This recommendation follows ACR consensus guidelines: White Paper of the ACR Incidental Findings Committee II on Adnexal Findings. J Am Coll Radiol 2013:10:675-681. 2. Small hiatal hernia. 3. Diffuse colonic diverticulosis without diverticulitis. 4. Aortic Atherosclerosis (ICD10-I70.0). Electronically Signed   By: EMisty StanleyM.D.   On: 12/27/2019 05:37    Assessment/Plan Burst fracture of lumbar vertebra, closed, [place] Will start Robaxin 250 tid prn Start on Tramadol 25 mg tid and prn Reduced the dose from 50 due to confusion Lidocaine patch Tylenol 650 QID  alternate with Tramadol Follow up with Neurosurgery   Diabetes mellitus type 2 in obese (HCC) CBG BID for 1 week to make sure she is not having Hypoglycemia On Amaryl  Mixed hyperlipidemia LDL Less then 100 On Statin  Depression with anxiety On Zoloft Dementia with behavioral disturbance, unspecified dementia type ([place] Off [place] right now  Other specified hypothyroidism TSH normal  Ovarian Cyst on Imaging Per daughter she has history of this Will Follow with GYN in next few weeks   Family/ staff Communication: Discussed with her daughter  Labs/tests ordered:

## 2020-01-10 ENCOUNTER — Encounter: Payer: Self-pay | Admitting: Internal Medicine

## 2020-01-10 ENCOUNTER — Non-Acute Institutional Stay (SKILLED_NURSING_FACILITY): Payer: Medicare Other | Admitting: Internal Medicine

## 2020-01-10 DIAGNOSIS — S32001A Stable burst fracture of unspecified lumbar vertebra, initial encounter for closed fracture: Secondary | ICD-10-CM

## 2020-01-10 DIAGNOSIS — R410 Disorientation, unspecified: Secondary | ICD-10-CM | POA: Diagnosis not present

## 2020-01-10 DIAGNOSIS — E1169 Type 2 diabetes mellitus with other specified complication: Secondary | ICD-10-CM

## 2020-01-10 DIAGNOSIS — I10 Essential (primary) hypertension: Secondary | ICD-10-CM

## 2020-01-10 DIAGNOSIS — E782 Mixed hyperlipidemia: Secondary | ICD-10-CM | POA: Diagnosis not present

## 2020-01-10 DIAGNOSIS — F418 Other specified anxiety disorders: Secondary | ICD-10-CM

## 2020-01-10 DIAGNOSIS — F0391 Unspecified dementia with behavioral disturbance: Secondary | ICD-10-CM

## 2020-01-10 DIAGNOSIS — E669 Obesity, unspecified: Secondary | ICD-10-CM

## 2020-01-10 LAB — BASIC METABOLIC PANEL
BUN: 27 — AB (ref 4–21)
CO2: 28 — AB (ref 13–22)
Chloride: 106 (ref 99–108)
Creatinine: 0.6 (ref 0.5–1.1)
Glucose: 117
Potassium: 3.3 — AB (ref 3.4–5.3)
Sodium: 142 (ref 137–147)

## 2020-01-10 LAB — HEPATIC FUNCTION PANEL
ALT: 17 (ref 7–35)
AST: 18 (ref 13–35)
Alkaline Phosphatase: 124 (ref 25–125)
Bilirubin, Total: 0.3

## 2020-01-10 LAB — COMPREHENSIVE METABOLIC PANEL
Albumin: 3.5 (ref 3.5–5.0)
Calcium: 9.2 (ref 8.7–10.7)
Globulin: 2.6

## 2020-01-10 LAB — CBC AND DIFFERENTIAL
HCT: 42 (ref 36–46)
Hemoglobin: 13.6 (ref 12.0–16.0)
Neutrophils Absolute: 10082
Platelets: 523 — AB (ref 150–399)
WBC: 14.3

## 2020-01-10 LAB — CBC: RBC: 5.45 — AB (ref 3.87–5.11)

## 2020-01-10 NOTE — Progress Notes (Signed)
Location:    [place] [facility] Room Number: 39 Place of Service:  SNF [phone]) Provider:  Veleta Miners MD  [name], MD  Patient Care Team: [name], MD as PCP - General (Internal Medicine)  Extended Emergency Contact Information Primary Emergency Contact: Harris,Margaret Address: [address]          [place], [place] [address] [name] of [place] Phone: [phone] Mobile Phone: [phone] Relation: Daughter  Code Status:  DNR Goals of care: Advanced Directive information Advanced Directives 01/02/2020  Does Patient Have a Medical Advance Directive? Yes  Type of Advance Directive Living will;Healthcare Power of Attorney  Does patient want to make changes to medical advance directive? No - Patient declined  Copy of [place] in Chart? Yes - validated most recent copy scanned in chart (See row information)  Would patient like information on creating a medical advance directive? -     Chief Complaint  Patient presents with  . Acute Visit    HPI:  Pt is a 86 y.o. female seen today for an acute visit for Lethargy Confusion and Hypertension   Patient was admitted in the hospital from 10/ 27-10/ 31 with L4 burst fracture after the fall  Patient has a history of type 2 diabetes, hyperlipidemia, depression, dementia, hypothyroidism, osteopenia, stress and urge urinary incontinence  [name] here in SNF for therapy But initially patient had severe pain and would refuse to get up from the bed.  She was started on Tylenol and tramadol and as needed Robaxin.  But she continues to have pain and now is also drowsy sleeps most of the time.  Still not working with therapy refuses to stand up and screams On my exam today she was still little drowsy but did wake up later in the day.   She does answer the questions appropriately. Continues to have poor appetite has lost almost 5 pounds since she has been here. Denies dysuria.  No  fever or chills no cough or chest pain no shortness of breath  Past Medical History:  Diagnosis Date  . Hearing loss   . History of fracture of clavicle   . Hypothyroidism   . Insomnia   . Tinnitus    Past Surgical History:  Procedure Laterality Date  . CHOLECYSTECTOMY  2007   Dr. Verdene Lennert  . CLAVICLE EXCISION  1986  . MOHS SURGERY     past 10 years chest & legs    Allergies  Allergen Reactions  . Metformin And Related Diarrhea    Allergies as of 01/10/2020      Reactions   Metformin And Related Diarrhea      Medication List       Accurate as of January 10, 2020 10:07 AM. If you have any questions, ask your nurse or doctor.        atorvastatin 10 MG tablet Commonly known as: LIPITOR TAKE 1 TABLET BY MOUTH EVERY DAY   Blood Glucose Monitoring Suppl Kit Use to test blood sugar once daily daily. Dx: E11.69   Boost Glucose Control Liqd Take by mouth. 1 can three times a day between meals   glimepiride 2 MG tablet Commonly known as: AMARYL TAKE 1 TABLET BY MOUTH EVERY DAY BEFORE BREAKFAST What changed: See the new instructions.   glucose blood test strip Use to test blood sugar once daily. Dx: E11.69   Lancets 30G Misc Use to test blood sugar once daily. Dx: E11.69  Lancets Misc. Kit 1 Device by Does not apply route daily. Use as directed   levothyroxine 75 MCG tablet Commonly known as: SYNTHROID TAKE 1 TABLET (75 MCG TOTAL) BY MOUTH DAILY BEFORE BREAKFAST.   Lidocaine 4 % Ptch Apply topically every morning.   Lidocaine 4 % Ptch Apply topically daily.   methocarbamol 500 MG tablet Commonly known as: ROBAXIN Take 250 mg by mouth 3 (three) times daily as needed for muscle spasms.   sertraline 25 MG tablet Commonly known as: ZOLOFT TAKE 1 TABLET BY MOUTH EVERY DAY   traMADol 50 MG tablet Commonly known as: ULTRAM Take 25 mg by mouth every 6 (six) hours as needed.   traMADol 50 MG tablet Commonly known as: ULTRAM Take 25 mg by mouth 3 (three)  times daily.   Vitamin D3 50 MCG (2000 UT) Tabs Take 1 tablet by mouth daily.   zinc oxide 20 % ointment Apply 1 application topically as needed for irritation.       Review of Systems  Constitutional: Positive for activity change and appetite change.  HENT: Negative.   Respiratory: Negative.   Cardiovascular: Negative.   Gastrointestinal: Negative.   Genitourinary: Negative.   Musculoskeletal: Positive for arthralgias, back pain and gait problem. Negative for joint swelling and myalgias.  Skin: Negative.   Neurological: Positive for weakness.  Psychiatric/Behavioral: Positive for confusion.    Immunization History  Administered Date(s) Administered  . DTaP 07/20/2013  . Influenza, High Dose Seasonal PF 12/07/2016  . Influenza,inj,Quad PF,6+ Mos 12/01/2017  . Influenza-Unspecified 11/20/2014, 12/12/2015, 11/20/2019  . Moderna SARS-COVID-2 Vaccination 03/05/2019, 04/02/2019  . PPD Test 08/06/2014  . Pneumococcal Conjugate-13 11/07/2013  . Pneumococcal Polysaccharide-23 03/22/2017  . Zoster 03/01/2006  . Zoster Recombinat (Shingrix) 06/07/2017, 08/26/2017   Pertinent  Health Maintenance Due  Topic Date Due  . OPHTHALMOLOGY EXAM  Never done  . URINE MICROALBUMIN  11/24/2018  . HEMOGLOBIN A1C  06/12/2020  . FOOT EXAM  07/10/2020  . INFLUENZA VACCINE  Completed  . DEXA SCAN  Completed  . PNA vac Low Risk Adult  Completed   Fall Risk  12/19/2019 10/17/2019 08/22/2019 07/11/2019 06/13/2019  Falls in the past year? 0 0 0 0 0  Number falls in past yr: 0 0 0 0 0  Injury with Fall? - - 0 - -   Functional Status Survey:    Vitals:   01/10/20 0937  BP: (!) 179/97  Pulse: (!) 105  Resp: 16  Temp: (!) 96.9 F (36.1 C)  SpO2: 96%  Weight: 150 lb (68 kg)  Height: 5' (1.524 m)   Body mass index is 29.29 kg/m. Physical Exam Vitals reviewed.  Constitutional:      Appearance: Normal appearance.  HENT:     Head: Normocephalic.     Nose: Nose normal.     Mouth/Throat:       Mouth: Mucous membranes are moist.     Pharynx: Oropharynx is clear.  Eyes:     Pupils: Pupils are equal, round, and reactive to light.  Cardiovascular:     Rate and Rhythm: Normal rate and regular rhythm.     Pulses: Normal pulses.  Pulmonary:     Effort: Pulmonary effort is normal. No respiratory distress.     Breath sounds: Normal breath sounds. No wheezing.  Abdominal:     General: Abdomen is flat. Bowel sounds are normal.     Palpations: Abdomen is soft.  Musculoskeletal:        General: No swelling.  Cervical back: Neck supple.  Skin:    General: Skin is warm and dry.  Neurological:     General: No focal deficit present.     Mental Status: She is alert.     Comments: Sleepy. But responds to her name and answers appropriately.   Psychiatric:        Mood and Affect: Mood normal.     Labs reviewed: Recent Labs    08/16/19 0800 08/16/19 0800 12/13/19 0000 12/26/19 2137 12/27/19 0410  NA 138   < > 141 139 136  K 4.6   < > 4.3 3.6 4.5  CL 104   < > 104 101 103  CO2 26  --  27  --  22  GLUCOSE 181*   < > 130* 169* 127*  BUN 18   < > '16 16 17  ' CREATININE 0.76   < > 0.78 0.60 0.74  CALCIUM 8.9  --  9.0  --  8.8*   < > = values in this interval not displayed.   Recent Labs    04/04/19 0928 08/16/19 0800 12/13/19 0000  AST '19 14 15  ' ALT '25 14 11  ' BILITOT 0.5 0.4 0.4  PROT 6.4 6.4 6.5   Recent Labs    08/16/19 0800 08/16/19 0800 12/13/19 0000 12/13/19 0000 12/26/19 2122 12/26/19 2137 12/27/19 0640 12/28/19 0604 12/29/19 0606  WBC 10.4   < > 9.6   < > 15.4*   < > 14.1* 12.1* 10.0  NEUTROABS 6,677  --  5,846  --  13.4*  --   --   --   --   HGB 12.9   < > 12.6   < > 13.5   < > 13.2 12.5 12.2  HCT 39.7   < > 39.7   < > 41.7   < > 41.3 39.9 39.3  MCV 79.4*   < > 80.2   < > 79.1*   < > 80.8 80.8 81.5  PLT 328   < > 318   < > 297   < > 256 245 233   < > = values in this interval not displayed.   Lab Results  Component Value Date   TSH 3.509  12/29/2019   Lab Results  Component Value Date   HGBA1C 6.8 (H) 12/13/2019   Lab Results  Component Value Date   CHOL 109 12/13/2019   HDL 36 (L) 12/13/2019   LDLCALC 51 12/13/2019   TRIG 132 12/13/2019   CHOLHDL 3.0 12/13/2019    Significant Diagnostic Results in last 30 days:  DG Lumbar Spine Complete  Result Date: 12/26/2019 CLINICAL DATA:  Unwitnessed fall, low back pain EXAM: LUMBAR SPINE - COMPLETE 4+ VIEW COMPARISON:  None. FINDINGS: Five lumbar vertebrae. The osseous structures appear diffusely demineralized which may limit detection of small or nondisplaced fractures. Age-indeterminate deformity involving the superior endplate L4 which could be related to a broad-based Schmorl's node formation no acute fracture is not excluded particularly given some cortical step-off anteriorly. No other acute fracture or height loss is evident. Multilevel degenerative changes are present in the imaged portions of the spine. Lower thoracic flowing anterior osteophytosis, could suggest diffuse idiopathic skeletal hyperostosis (DISH). Interspinous arthrosis and facet arthropathy noted throughout the lumbar levels. Atherosclerotic calcification of the abdominal aorta. Remaining soft tissues are unremarkable. Calcified pelvic phleboliths. IMPRESSION: 1. Age-indeterminate deformity involving the superior endplate L4 which could be related to acute injury or a broad-based Schmorl's node formation. Correlate for point tenderness  and consider cross-sectional imaging which will be more sensitive and specific for subtle injury in this patient with diffuse bony demineralization and advanced degenerative change. 2. Multilevel features of discogenic, facet degenerative and interspinous arthrosis as above. 3.  Aortic Atherosclerosis (ICD10-I70.0). Electronically Signed   By: Lovena Le M.D.   On: 12/26/2019 21:06   CT Lumbar Spine Wo Contrast  Result Date: 12/26/2019 CLINICAL DATA:  Acute low back pain EXAM: CT  LUMBAR SPINE WITHOUT CONTRAST TECHNIQUE: Multidetector CT imaging of the lumbar spine was performed without intravenous contrast administration. Multiplanar CT image reconstructions were also generated. COMPARISON:  None. FINDINGS: Segmentation: Standard Alignment: No static subluxation. Vertebrae: Acute incomplete burst fracture of L4 involving the anterior and posterior walls and the superior endplate. Minimal height loss and retropulsion. Chronic height loss of L5. Diffuse osteopenia. Paraspinal and other soft tissues: Calcific aortic atherosclerosis. Incompletely visualized cystic lesions of the upper pelvis. Disc levels: No spinal canal stenosis.  No neural impingement. IMPRESSION: 1. Acute incomplete burst fracture of L4 involving the anterior and posterior walls and the superior endplate. Minimal height loss and retropulsion. 2. Cystic masses of the upper pelvis, incompletely visualized. Pelvic ultrasound or CT abdomen/pelvis recommended. 3. No spinal canal stenosis. 4. Chronic height loss of L5. Aortic Atherosclerosis (ICD10-I70.0). Electronically Signed   By: Ulyses Jarred M.D.   On: 12/26/2019 23:22   US PELVIS (TRANSABDOMINAL ONLY)  Result Date: 12/28/2019 CLINICAL DATA:  Cyst seen on CT, MR EXAM: TRANSABDOMINAL ULTRASOUND OF PELVIS TECHNIQUE: Transabdominal ultrasound examination of the pelvis was performed including evaluation of the uterus, ovaries, adnexal regions, and pelvic cul-de-sac. COMPARISON:  CT 12/27/2019 FINDINGS: Uterus Measurements: 6 x 2.9 x 3.9 cm = volume: 35 mL. Retroverted uterus. Atrophic appearance is quite typical for a patient of advanced age. Endometrium Thickness: 7.3 mm, top normal for patient age albeit with a small amount of endometrial fluid. Right ovary Not visualized on these transabdominal images. Left ovary Cystic lesion in the left adnexa measuring 5.8 x 5.4 x 5.2 cm with estimated volume of 81 mL. Normal ovarian tissue is not well visualized. No significant  internal complexity. Other findings:  No abnormal free fluid. IMPRESSION: Redemonstration of a cystic lesion arising in the left adnexa without visualization of the normal ovarian parenchyma. Measures 5.8 cm on this examination. Consider GYN consult and followup US in 3-6 months, or pelvis MRI w/o and w/ contrast for improved characterization. Note: This recommendation does not apply to premenarchal patients or to those with increased risk (genetic, family history, elevated tumor markers or other high-risk factors) of ovarian cancer. Reference: Radiology 2019 Nov; 293(2):359-371. Top-normal endometrial thickness albeit with a small amount of fluid within the endometrial canal warranting further evaluation. Endometrial sampling should be considered to exclude carcinoma. Nonvisualization of the right ovary. Retroverted, atrophic uterus. Electronically Signed   By: Lovena Le M.D.   On: 12/28/2019 17:08   CT ABDOMEN PELVIS W CONTRAST  Result Date: 12/27/2019 CLINICAL DATA:  Cystic pelvic mass seen on recent lumbar spine CT. EXAM: CT ABDOMEN AND PELVIS WITH CONTRAST TECHNIQUE: Multidetector CT imaging of the abdomen and pelvis was performed using the standard protocol following bolus administration of intravenous contrast. CONTRAST:  174m OMNIPAQUE IOHEXOL 300 MG/ML  SOLN COMPARISON:  Lumbar spine CT 12/26/2019. FINDINGS: Lower chest: Dependent atelectasis noted in the lower lungs bilaterally. Hepatobiliary: No suspicious focal abnormality within the liver parenchyma. Tiny rim calcified lesion along the posterior liver capsule likely sequelae of prior infection/inflammation. Gallbladder is surgically absent. No intrahepatic  or extrahepatic biliary dilation. Pancreas: No focal mass lesion. No dilatation of the main duct. No intraparenchymal cyst. No peripancreatic edema. Spleen: No splenomegaly. No focal mass lesion. Adrenals/Urinary Tract: No adrenal nodule or mass. Kidneys unremarkable. No evidence for  hydroureter. The urinary bladder appears normal for the degree of distention. Stomach/Bowel: Small hiatal hernia. Stomach otherwise unremarkable Duodenum is normally positioned as is the ligament of Treitz. No small bowel wall thickening. No small bowel dilatation. The terminal ileum is normal. The appendix is normal. No gross colonic mass. No colonic wall thickening. Diverticuli are seen scattered along the entire length of the colon without CT findings of diverticulitis. Vascular/Lymphatic: There is abdominal aortic atherosclerosis without aneurysm. There is no gastrohepatic or hepatoduodenal ligament lymphadenopathy. No retroperitoneal or mesenteric lymphadenopathy. No pelvic sidewall lymphadenopathy. Reproductive: The uterus is unremarkable. No right adnexal mass. 6.7 x 6.2 x 6.0 cm homogeneous water density lesion is identified in the left adnexal space. The left gonadal vein tracks directly into the lateral margin of this finding suggesting left ovarian origin. No evidence for wall thickening, mural nodule, or internal architecture. Other: No intraperitoneal free fluid. Musculoskeletal: No worrisome lytic or sclerotic osseous abnormality. As noted on recent lumbar spine CT, there is an acute L4 burst fracture, better characterized on the prior study. Chronic loss of vertebral body height noted at L5. IMPRESSION: 1. 6.7 x 6.2 x 6.0 cm benign appearing cystic lesion identified in the left ovary. Nonemergent pelvic ultrasound could be used to further evaluate as clinically warranted. This recommendation follows ACR consensus guidelines: White Paper of the ACR Incidental Findings Committee II on Adnexal Findings. J Am Coll Radiol 2013:10:675-681. 2. Small hiatal hernia. 3. Diffuse colonic diverticulosis without diverticulitis. 4. Aortic Atherosclerosis (ICD10-I70.0). Electronically Signed   By: Misty Stanley M.D.   On: 12/27/2019 05:37    Assessment/Plan  Confusion Stat CBC with Diff, and  CMP Addendum White Count is 14.3 Hgb is 13.6 Bun is slightly high at 27 and creat 0.62 k 3.3 Change Tramadol to Prn instead of scheduled Discontinue Robaxin Try to use only Tylenol  Also get UA and Chest Xray to rule out infection Her Covid was negative  Burst fracture of lumbar vertebra, closed, initial encounter (HCC) Tylenol prn PRN Tramadol Lidocaine patches  Trying to avoid starting Neurontin right now till Mental status is better Hypokalemia Will supplement Diabetes mellitus type 2 in obese (HCC) CBG 140-170 On AMaryl Essential hypertension Start on Cozaar 25 mg QD for now   Mixed hyperlipidemia Continue Statin Depression with anxiety Low dose Zoloft Dementia with behavioral disturbance, unspecified dementia type ([place]) Failed Namenda Other specified hypothyroidism TSH normal  Ovarian Cyst on Imaging Per daughter she has history of this Will Follow with GYN in next few weeks   Family/ staff Communication:   Labs/tests ordered:    Total time spent in this patient care encounter was  45_  minutes; greater than 50% of the visit spent counseling patient and staff, reviewing records , Labs and coordinating care for problems addressed at this encounter.

## 2020-01-14 ENCOUNTER — Non-Acute Institutional Stay (SKILLED_NURSING_FACILITY): Payer: Medicare Other | Admitting: Nurse Practitioner

## 2020-01-14 ENCOUNTER — Encounter: Payer: Self-pay | Admitting: Nurse Practitioner

## 2020-01-14 DIAGNOSIS — J189 Pneumonia, unspecified organism: Secondary | ICD-10-CM | POA: Insufficient documentation

## 2020-01-14 DIAGNOSIS — S32001A Stable burst fracture of unspecified lumbar vertebra, initial encounter for closed fracture: Secondary | ICD-10-CM | POA: Diagnosis not present

## 2020-01-14 DIAGNOSIS — R269 Unspecified abnormalities of gait and mobility: Secondary | ICD-10-CM

## 2020-01-14 DIAGNOSIS — E1169 Type 2 diabetes mellitus with other specified complication: Secondary | ICD-10-CM

## 2020-01-14 DIAGNOSIS — E038 Other specified hypothyroidism: Secondary | ICD-10-CM | POA: Diagnosis not present

## 2020-01-14 DIAGNOSIS — E782 Mixed hyperlipidemia: Secondary | ICD-10-CM

## 2020-01-14 DIAGNOSIS — F0391 Unspecified dementia with behavioral disturbance: Secondary | ICD-10-CM

## 2020-01-14 DIAGNOSIS — N838 Other noninflammatory disorders of ovary, fallopian tube and broad ligament: Secondary | ICD-10-CM

## 2020-01-14 DIAGNOSIS — F418 Other specified anxiety disorders: Secondary | ICD-10-CM

## 2020-01-14 DIAGNOSIS — E669 Obesity, unspecified: Secondary | ICD-10-CM

## 2020-01-14 HISTORY — DX: Pneumonia, unspecified organism: J18.9

## 2020-01-14 NOTE — Assessment & Plan Note (Signed)
Hospitalized 12/26/19-12/30/19  For L4 burst fracture of lumbar vertebra identified on CT 12/27/19, non surgical management, lumbar brace when up and walking, , f/u neurosurgery in 1-2 weeks, Tramadol prn, Tylenol, Lidocaine patch for pain

## 2020-01-14 NOTE — Assessment & Plan Note (Signed)
Left ovarian cystic mass, had pelvic US, f/u Gyn

## 2020-01-14 NOTE — Assessment & Plan Note (Signed)
T2DM, Hgb a1c 6.8 12/13/19, takes Glimepiride 20mg  qd.

## 2020-01-14 NOTE — Assessment & Plan Note (Addendum)
Persisted confusion,  leukocytosis, CXR 01/11/20 showed mildly patchy right lower lung opacity, negative COVID. The patient is afebrile, no O2 desaturation.  Will complete total 7 day course of Doxycycline 118m bid started 01/11/20. Observe. Update CBC/diff, CMP/eGFR one week.

## 2020-01-14 NOTE — Assessment & Plan Note (Signed)
Dementia with mild delirium,  stopped Memantine per Dtr's request due to dizziness as possible side effect, declined Seroquel

## 2020-01-14 NOTE — Progress Notes (Signed)
Location:    [facility] Room Number: 39 Place of Service:  SNF (31) Provider: Lennie Odor Laurette Villescas NP  [name], MD  Patient Care Team: [name], MD as PCP - General (Internal Medicine)  Extended Emergency Contact Information Primary Emergency Contact: Harris,Margaret Address: [place], [place] [address] [name] of [place] Phone: [phone] Mobile Phone: [phone] Relation: Daughter  Code Status:  DNR Goals of care: Advanced Directive information Advanced Directives 01/02/2020  Does Patient Have a Medical Advance Directive? Yes  Type of Advance Directive Living will;Healthcare Power of Attorney  Does patient want to make changes to medical advance directive? No - Patient declined  Copy of [place] in Chart? Yes - validated most recent copy scanned in chart (See row information)  Would patient like information on creating a medical advance directive? -     Chief Complaint  Patient presents with  . Acute Visit    Possible pneumonia    HPI:  Pt is a 86 y.o. female seen today for an acute visit for persisted confusion, changed Tramadol to prn, dc'd Robaxin, 01/10/20, leukocytosis, CXR 01/11/20 showed mildly patchy right lower lung opacity, negative COVID. The patient is afebrile, no O2 desaturation. The patient denied dysuria, lower abd pain today.    Hospitalized 12/26/19-12/30/19  For L4 burst fracture of lumbar vertebra identified on CT 12/27/19, non surgical management, lumbar brace when up and walking, , f/u neurosurgery in 1-2 weeks, Tramadol prn, Tylenol, Lidocaine patch for pain             Hypothyroidism, TSH 3.5 12/29/19, takes Levothyroxine             T2DM, Hgb a1c 6.8 12/13/19, takes Glimepiride $RemoveBeforeDEI'20mg'ytUfaCGHiEmmlCOj$  qd.              Hyperlipidemia, takes Atorvastain             Depression/axniety, takes Sertraline.              Dementia with mild delirium,  stopped Memantine per Dtr's request due to  dizziness as possible side effect, declined Seroquel             Gait abnormality, working with therapy.              Left ovarian cystic mass, had pelvic US, f/u Gyn    Past Medical History:  Diagnosis Date  . Hearing loss   . History of fracture of clavicle   . Hypothyroidism   . Insomnia   . Tinnitus    Past Surgical History:  Procedure Laterality Date  . CHOLECYSTECTOMY  2007   Dr. Verdene Lennert  . CLAVICLE EXCISION  1986  . MOHS SURGERY     past 10 years chest & legs    Allergies  Allergen Reactions  . Metformin And Related Diarrhea    Allergies as of 01/14/2020      Reactions   Metformin And Related Diarrhea      Medication List       Accurate as of January 14, 2020 11:59 PM. If you have any questions, ask your nurse or doctor.        acetaminophen 325 MG tablet Commonly known as: TYLENOL Take 650 mg by mouth in the morning, at noon, and at bedtime.   atorvastatin 10 MG tablet Commonly known as: LIPITOR TAKE 1 TABLET BY MOUTH EVERY DAY   Blood Glucose Monitoring Suppl Kit  Use to test blood sugar once daily daily. Dx: E11.69   Boost Glucose Control Liqd Take by mouth. 1 can three times a day between meals   doxycycline 100 MG tablet Commonly known as: VIBRA-TABS Take 100 mg by mouth 2 (two) times daily.   glimepiride 2 MG tablet Commonly known as: AMARYL TAKE 1 TABLET BY MOUTH EVERY DAY BEFORE BREAKFAST What changed: See the new instructions.   glucose blood test strip Use to test blood sugar once daily. Dx: E11.69   Lancets 30G Misc Use to test blood sugar once daily. Dx: E11.69   Lancets Misc. Kit 1 Device by Does not apply route daily. Use as directed   levothyroxine 75 MCG tablet Commonly known as: SYNTHROID TAKE 1 TABLET (75 MCG TOTAL) BY MOUTH DAILY BEFORE BREAKFAST.   Lidocaine 4 % Ptch Apply topically every morning.   Lidocaine 4 % Ptch Apply topically daily.   losartan 25 MG tablet Commonly known as: COZAAR Take 25 mg by mouth  daily.   potassium chloride 10 MEQ tablet Commonly known as: KLOR-CON Take 10 mEq by mouth daily.   saccharomyces boulardii 250 MG capsule Commonly known as: FLORASTOR Take 250 mg by mouth 2 (two) times daily.   sertraline 25 MG tablet Commonly known as: ZOLOFT TAKE 1 TABLET BY MOUTH EVERY DAY   traMADol 50 MG tablet Commonly known as: ULTRAM Take 25 mg by mouth every 6 (six) hours as needed.   Vitamin D3 50 MCG (2000 UT) Tabs Take 1 tablet by mouth daily.   zinc oxide 20 % ointment Apply 1 application topically as needed for irritation.       Review of Systems  Constitutional: Positive for activity change, appetite change and fatigue. Negative for fever.  HENT: Positive for hearing loss. Negative for congestion and voice change.   Eyes: Negative for visual disturbance.  Respiratory: Negative for cough, shortness of breath and wheezing.   Cardiovascular: Negative for chest pain, palpitations and leg swelling.  Gastrointestinal: Negative for abdominal distention, abdominal pain, constipation, nausea and vomiting.  Genitourinary: Negative for difficulty urinating, dysuria and urgency.  Musculoskeletal: Positive for arthralgias, back pain and gait problem.  Skin: Negative for color change.  Neurological: Negative for speech difficulty, weakness, light-headedness and headaches.       Confused.   Psychiatric/Behavioral: Positive for behavioral problems and confusion. Negative for agitation, hallucinations and sleep disturbance. The patient is not nervous/anxious.     Immunization History  Administered Date(s) Administered  . DTaP 07/20/2013  . Influenza, High Dose Seasonal PF 12/07/2016  . Influenza,inj,Quad PF,6+ Mos 12/01/2017  . Influenza-Unspecified 11/20/2014, 12/12/2015, 11/20/2019  . Moderna SARS-COVID-2 Vaccination 03/05/2019, 04/02/2019  . PPD Test 08/06/2014  . Pneumococcal Conjugate-13 11/07/2013  . Pneumococcal Polysaccharide-23 03/22/2017  . Zoster  03/01/2006  . Zoster Recombinat (Shingrix) 06/07/2017, 08/26/2017   Pertinent  Health Maintenance Due  Topic Date Due  . OPHTHALMOLOGY EXAM  Never done  . HEMOGLOBIN A1C  06/12/2020  . FOOT EXAM  07/10/2020  . INFLUENZA VACCINE  Completed  . DEXA SCAN  Completed  . PNA vac Low Risk Adult  Completed   Fall Risk  12/19/2019 10/17/2019 08/22/2019 07/11/2019 06/13/2019  Falls in the past year? 0 0 0 0 0  Number falls in past yr: 0 0 0 0 0  Injury with Fall? - - 0 - -   Functional Status Survey:    Vitals:   01/14/20 1130  BP: (!) 164/78  Pulse: (!) 113  Resp: 12  Temp: (!) 97.2 F (36.2 C)  SpO2: 96%  Weight: 150 lb (68 kg)  Height: 5' (1.524 m)   Body mass index is 29.29 kg/m. Physical Exam Vitals and nursing note reviewed.  Constitutional:      General: She is not in acute distress.    Appearance: She is not ill-appearing.     Comments: She seems tired but stayed awake during today's examination.   HENT:     Head: Normocephalic and atraumatic.     Nose: Nose normal.     Mouth/Throat:     Mouth: Mucous membranes are moist.  Eyes:     Extraocular Movements: Extraocular movements intact.     Conjunctiva/sclera: Conjunctivae normal.     Pupils: Pupils are equal, round, and reactive to light.  Cardiovascular:     Rate and Rhythm: Normal rate and regular rhythm.     Heart sounds: No murmur heard.   Pulmonary:     Effort: Pulmonary effort is normal.     Breath sounds: No wheezing or rales.  Abdominal:     Palpations: Abdomen is soft.     Tenderness: There is no abdominal tenderness.     Comments: Protruding abd.   Musculoskeletal:        General: Tenderness present.     Cervical back: Normal range of motion and neck supple.     Right lower leg: No edema.     Left lower leg: No edema.     Comments: Lower back   Skin:    General: Skin is warm and dry.  Neurological:     General: No focal deficit present.     Mental Status: She is alert. Mental status is at  baseline.     Motor: No weakness.     Coordination: Coordination abnormal.     Gait: Gait abnormal.     Comments: Oriented to person, followed simple directions.   Psychiatric:        Mood and Affect: Mood normal.        Behavior: Behavior normal.     Labs reviewed: Recent Labs    12/13/19 0000 12/13/19 0000 12/26/19 2137 12/27/19 0410 01/10/20 0000  NA 141   < > 139 136 142  K 4.3   < > 3.6 4.5 3.3*  CL 104   < > 101 103 106  CO2 27  --   --  22 28*  GLUCOSE 130*  --  169* 127*  --   BUN 16   < > 16 17 27*  CREATININE 0.78  --  0.60 0.74 0.6  CALCIUM 9.0  --   --  8.8* 9.2   < > = values in this interval not displayed.   Recent Labs    04/04/19 0928 04/04/19 0928 08/16/19 0800 12/13/19 0000 01/10/20 0000  AST 19   < > _0 ALT 25   < > _1 ALKPHOS  --   --   --   --  124  BILITOT 0.5  --  0.4 0.4  --   PROT 6.4  --  6.4 6.5  --   ALBUMIN  --   --   --   --  3.5   < > = values in this interval not displayed.   Recent Labs    12/13/19 0000 12/13/19 0000 12/26/19 2122 12/26/19 2137 12/27/19 0640 12/27/19 0640 12/28/19 0604 12/29/19 0606 01/10/20 0000  WBC 9.6   < > 15.4*   < >  14.1*   < > 12.1* 10.0 14.3  NEUTROABS 5,846  --  13.4*  --   --   --   --   --  10,082.00  HGB 12.6   < > 13.5   < > 13.2   < > 12.5 12.2 13.6  HCT 39.7   < > 41.7   < > 41.3   < > 39.9 39.3 42  MCV 80.2   < > 79.1*   < > 80.8  --  80.8 81.5  --   PLT 318   < > 297   < > 256   < > 245 233 523*   < > = values in this interval not displayed.   Lab Results  Component Value Date   TSH 3.509 12/29/2019   Lab Results  Component Value Date   HGBA1C 6.8 (H) 12/13/2019   Lab Results  Component Value Date   CHOL 109 12/13/2019   HDL 36 (L) 12/13/2019   LDLCALC 51 12/13/2019   TRIG 132 12/13/2019   CHOLHDL 3.0 12/13/2019    Significant Diagnostic Results in last 30 days:  DG Lumbar Spine Complete  Result Date: 12/26/2019 CLINICAL DATA:  Unwitnessed fall, low  back pain EXAM: LUMBAR SPINE - COMPLETE 4+ VIEW COMPARISON:  None. FINDINGS: Five lumbar vertebrae. The osseous structures appear diffusely demineralized which may limit detection of small or nondisplaced fractures. Age-indeterminate deformity involving the superior endplate L4 which could be related to a broad-based Schmorl's node formation no acute fracture is not excluded particularly given some cortical step-off anteriorly. No other acute fracture or height loss is evident. Multilevel degenerative changes are present in the imaged portions of the spine. Lower thoracic flowing anterior osteophytosis, could suggest diffuse idiopathic skeletal hyperostosis (DISH). Interspinous arthrosis and facet arthropathy noted throughout the lumbar levels. Atherosclerotic calcification of the abdominal aorta. Remaining soft tissues are unremarkable. Calcified pelvic phleboliths. IMPRESSION: 1. Age-indeterminate deformity involving the superior endplate L4 which could be related to acute injury or a broad-based Schmorl's node formation. Correlate for point tenderness and consider cross-sectional imaging which will be more sensitive and specific for subtle injury in this patient with diffuse bony demineralization and advanced degenerative change. 2. Multilevel features of discogenic, facet degenerative and interspinous arthrosis as above. 3.  Aortic Atherosclerosis (ICD10-I70.0). Electronically Signed   By: Lovena Le M.D.   On: 12/26/2019 21:06   CT Lumbar Spine Wo Contrast  Result Date: 12/26/2019 CLINICAL DATA:  Acute low back pain EXAM: CT LUMBAR SPINE WITHOUT CONTRAST TECHNIQUE: Multidetector CT imaging of the lumbar spine was performed without intravenous contrast administration. Multiplanar CT image reconstructions were also generated. COMPARISON:  None. FINDINGS: Segmentation: Standard Alignment: No static subluxation. Vertebrae: Acute incomplete burst fracture of L4 involving the anterior and posterior walls and  the superior endplate. Minimal height loss and retropulsion. Chronic height loss of L5. Diffuse osteopenia. Paraspinal and other soft tissues: Calcific aortic atherosclerosis. Incompletely visualized cystic lesions of the upper pelvis. Disc levels: No spinal canal stenosis.  No neural impingement. IMPRESSION: 1. Acute incomplete burst fracture of L4 involving the anterior and posterior walls and the superior endplate. Minimal height loss and retropulsion. 2. Cystic masses of the upper pelvis, incompletely visualized. Pelvic ultrasound or CT abdomen/pelvis recommended. 3. No spinal canal stenosis. 4. Chronic height loss of L5. Aortic Atherosclerosis (ICD10-I70.0). Electronically Signed   By: Ulyses Jarred M.D.   On: 12/26/2019 23:22   US PELVIS (TRANSABDOMINAL ONLY)  Result Date: 12/28/2019 CLINICAL DATA:  Cyst  seen on CT, MR EXAM: TRANSABDOMINAL ULTRASOUND OF PELVIS TECHNIQUE: Transabdominal ultrasound examination of the pelvis was performed including evaluation of the uterus, ovaries, adnexal regions, and pelvic cul-de-sac. COMPARISON:  CT 12/27/2019 FINDINGS: Uterus Measurements: 6 x 2.9 x 3.9 cm = volume: 35 mL. Retroverted uterus. Atrophic appearance is quite typical for a patient of advanced age. Endometrium Thickness: 7.3 mm, top normal for patient age albeit with a small amount of endometrial fluid. Right ovary Not visualized on these transabdominal images. Left ovary Cystic lesion in the left adnexa measuring 5.8 x 5.4 x 5.2 cm with estimated volume of 81 mL. Normal ovarian tissue is not well visualized. No significant internal complexity. Other findings:  No abnormal free fluid. IMPRESSION: Redemonstration of a cystic lesion arising in the left adnexa without visualization of the normal ovarian parenchyma. Measures 5.8 cm on this examination. Consider GYN consult and followup US in 3-6 months, or pelvis MRI w/o and w/ contrast for improved characterization. Note: This recommendation does not apply to  premenarchal patients or to those with increased risk (genetic, family history, elevated tumor markers or other high-risk factors) of ovarian cancer. Reference: Radiology 2019 Nov; 293(2):359-371. Top-normal endometrial thickness albeit with a small amount of fluid within the endometrial canal warranting further evaluation. Endometrial sampling should be considered to exclude carcinoma. Nonvisualization of the right ovary. Retroverted, atrophic uterus. Electronically Signed   By: Lovena Le M.D.   On: 12/28/2019 17:08   CT ABDOMEN PELVIS W CONTRAST  Result Date: 12/27/2019 CLINICAL DATA:  Cystic pelvic mass seen on recent lumbar spine CT. EXAM: CT ABDOMEN AND PELVIS WITH CONTRAST TECHNIQUE: Multidetector CT imaging of the abdomen and pelvis was performed using the standard protocol following bolus administration of intravenous contrast. CONTRAST:  167mL OMNIPAQUE IOHEXOL 300 MG/ML  SOLN COMPARISON:  Lumbar spine CT 12/26/2019. FINDINGS: Lower chest: Dependent atelectasis noted in the lower lungs bilaterally. Hepatobiliary: No suspicious focal abnormality within the liver parenchyma. Tiny rim calcified lesion along the posterior liver capsule likely sequelae of prior infection/inflammation. Gallbladder is surgically absent. No intrahepatic or extrahepatic biliary dilation. Pancreas: No focal mass lesion. No dilatation of the main duct. No intraparenchymal cyst. No peripancreatic edema. Spleen: No splenomegaly. No focal mass lesion. Adrenals/Urinary Tract: No adrenal nodule or mass. Kidneys unremarkable. No evidence for hydroureter. The urinary bladder appears normal for the degree of distention. Stomach/Bowel: Small hiatal hernia. Stomach otherwise unremarkable Duodenum is normally positioned as is the ligament of Treitz. No small bowel wall thickening. No small bowel dilatation. The terminal ileum is normal. The appendix is normal. No gross colonic mass. No colonic wall thickening. Diverticuli are seen  scattered along the entire length of the colon without CT findings of diverticulitis. Vascular/Lymphatic: There is abdominal aortic atherosclerosis without aneurysm. There is no gastrohepatic or hepatoduodenal ligament lymphadenopathy. No retroperitoneal or mesenteric lymphadenopathy. No pelvic sidewall lymphadenopathy. Reproductive: The uterus is unremarkable. No right adnexal mass. 6.7 x 6.2 x 6.0 cm homogeneous water density lesion is identified in the left adnexal space. The left gonadal vein tracks directly into the lateral margin of this finding suggesting left ovarian origin. No evidence for wall thickening, mural nodule, or internal architecture. Other: No intraperitoneal free fluid. Musculoskeletal: No worrisome lytic or sclerotic osseous abnormality. As noted on recent lumbar spine CT, there is an acute L4 burst fracture, better characterized on the prior study. Chronic loss of vertebral body height noted at L5. IMPRESSION: 1. 6.7 x 6.2 x 6.0 cm benign appearing cystic lesion identified in the left  ovary. Nonemergent pelvic ultrasound could be used to further evaluate as clinically warranted. This recommendation follows ACR consensus guidelines: White Paper of the ACR Incidental Findings Committee II on Adnexal Findings. J Am Coll Radiol 2013:10:675-681. 2. Small hiatal hernia. 3. Diffuse colonic diverticulosis without diverticulitis. 4. Aortic Atherosclerosis (ICD10-I70.0). Electronically Signed   By: Misty Stanley M.D.   On: 12/27/2019 05:37    Assessment/Plan Right lower lobe pneumonia Persisted confusion,  leukocytosis, CXR 01/11/20 showed mildly patchy right lower lung opacity, negative COVID. The patient is afebrile, no O2 desaturation.  Will complete total 7 day course of Doxycycline 162m bid started 01/11/20. Observe. Update CBC/diff, CMP/eGFR one week.   Burst fracture of lumbar vertebra, closed, initial encounter ([place] [facility]26[facility]19-12/30[facility]19  For L4 burst fracture of lumbar  vertebra identified on CT 12/27/19, non surgical management, lumbar brace when up and walking, , f/u neurosurgery in 1-2 weeks, Tramadol prn, Tylenol, Lidocaine patch for pain   Hypothyroidism Hypothyroidism, TSH 3.5 12/29/19, takes Levothyroxine   Diabetes mellitus type 2 in obese (HCC) T2DM, Hgb a1c 6.8 12/13/19, takes Glimepiride 242mqd.    Mixed hyperlipidemia Hyperlipidemia, takes Atorvastain  Depression with anxiety Depression/axniety, takes Sertraline.   Dementia ([place] with mild delirium,  stopped Memantine per Dtr's request due to dizziness as possible side effect, declined Seroquel   Gait abnormality Gait abnormality, working with therapy.   Left tubo-ovarian mass Left ovarian cystic mass, had pelvic US[place]af/u Gyn     Family/ staff Communication: plan of care reviewed with the patient and charge nurse.   Labs/tests ordered:  CXR done 01/11/20, CBC/diff, CMP/eGFR one week.   Time spend 40 minutes.

## 2020-01-14 NOTE — Assessment & Plan Note (Signed)
Depression/axniety, takes Sertraline.

## 2020-01-14 NOTE — Assessment & Plan Note (Signed)
Hyperlipidemia, takes Atorvastain

## 2020-01-14 NOTE — Assessment & Plan Note (Signed)
Gait abnormality, working with therapy.

## 2020-01-14 NOTE — Assessment & Plan Note (Signed)
Hypothyroidism, TSH 3.5 12/29/19, takes Levothyroxine

## 2020-01-15 ENCOUNTER — Encounter: Payer: Self-pay | Admitting: Nurse Practitioner

## 2020-01-17 ENCOUNTER — Non-Acute Institutional Stay (SKILLED_NURSING_FACILITY): Payer: Medicare Other | Admitting: Internal Medicine

## 2020-01-17 ENCOUNTER — Encounter: Payer: Self-pay | Admitting: Internal Medicine

## 2020-01-17 DIAGNOSIS — E1169 Type 2 diabetes mellitus with other specified complication: Secondary | ICD-10-CM | POA: Diagnosis not present

## 2020-01-17 DIAGNOSIS — F0390 Unspecified dementia without behavioral disturbance: Secondary | ICD-10-CM | POA: Diagnosis not present

## 2020-01-17 DIAGNOSIS — J189 Pneumonia, unspecified organism: Secondary | ICD-10-CM | POA: Diagnosis not present

## 2020-01-17 DIAGNOSIS — R410 Disorientation, unspecified: Secondary | ICD-10-CM | POA: Diagnosis not present

## 2020-01-17 DIAGNOSIS — F320 Major depressive disorder, single episode, mild: Secondary | ICD-10-CM

## 2020-01-17 DIAGNOSIS — E669 Obesity, unspecified: Secondary | ICD-10-CM

## 2020-01-17 DIAGNOSIS — I10 Essential (primary) hypertension: Secondary | ICD-10-CM

## 2020-01-17 DIAGNOSIS — S32040G Wedge compression fracture of fourth lumbar vertebra, subsequent encounter for fracture with delayed healing: Secondary | ICD-10-CM

## 2020-01-17 NOTE — Progress Notes (Signed)
Location:    [facility] Room Number: 39 Place of Service:  SNF [phone]) Provider:  Veleta Miners MD  [name], MD  Patient Care Team: [name], MD as PCP - General (Internal Medicine)  Extended Emergency Contact Information Primary Emergency Contact: Harris,Margaret Address: [address]          [place], [place] [address] [name] of [place] Phone: [phone] Mobile Phone: [phone] Relation: Daughter  Code Status:  DNR Goals of care: Advanced Directive information Advanced Directives 01/02/2020  Does Patient Have a Medical Advance Directive? Yes  Type of Advance Directive Living will;Healthcare Power of Attorney  Does patient want to make changes to medical advance directive? No - Patient declined  Copy of [place] in Chart? Yes - validated most recent copy scanned in chart (See row information)  Would patient like information on creating a medical advance directive? -     Chief Complaint  Patient presents with  . Acute Visit    HPI:  Pt is a 86 y.o. female seen today for an acute visit for Follow up of Pneumonia per her daughter request  Patient was admitted in the hospital from 10/27-10/31 with L4 burst fracture after the fall  Patient has a history of type 2 diabetes, hyperlipidemia, depression, dementia, hypothyroidism, osteopenia, stress and urge urinary incontinence  [name] here in SNF for therapy  L4 burst fracture Was taken off all her Pain meds and muscle relaxant due to lethargic Now she is on Tylenol lidocaine patches and as needed tramadol Seems to be doing better.  Has started working with therapy Right lower lobe pneumonia No fever no cough no shortness of breath Tolerating antibiotics Mental status change Her mental status is now at baseline.  Is off all her pain meds and was treated for pneumonia  Poor appetite Is doing slightly better  Past Medical History:  Diagnosis Date   . Hearing loss   . History of fracture of clavicle   . Hypothyroidism   . Insomnia   . Tinnitus    Past Surgical History:  Procedure Laterality Date  . CHOLECYSTECTOMY  2007   Dr. Verdene Lennert  . CLAVICLE EXCISION  1986  . MOHS SURGERY     past 10 years chest & legs    Allergies  Allergen Reactions  . Metformin And Related Diarrhea    Allergies as of 01/17/2020      Reactions   Metformin And Related Diarrhea      Medication List       Accurate as of January 17, 2020  9:19 AM. If you have any questions, ask your nurse or doctor.        acetaminophen 325 MG tablet Commonly known as: TYLENOL Take 650 mg by mouth in the morning, at noon, and at bedtime.   atorvastatin 10 MG tablet Commonly known as: LIPITOR Take 10 mg by mouth daily. What changed: Another medication with the same name was removed. Continue taking this medication, and follow the directions you see here. Changed by: [name], MD   Blood Glucose Monitoring Suppl Kit Use to test blood sugar once daily daily. Dx: E11.69   Boost Glucose Control Liqd Take by mouth. 1 can three times a day between meals   doxycycline 100 MG tablet Commonly known as: VIBRA-TABS Take 100 mg by mouth 2 (two) times daily.   glimepiride 2 MG tablet Commonly known as: AMARYL Take 2 mg by mouth daily  with breakfast. What changed: Another medication with the same name was removed. Continue taking this medication, and follow the directions you see here. Changed by: [name], MD   glucose blood test strip Use to test blood sugar once daily. Dx: E11.69   Lancets 30G Misc Use to test blood sugar once daily. Dx: E11.69   Lancets Misc. Kit 1 Device by Does not apply route daily. Use as directed   levothyroxine 75 MCG tablet Commonly known as: SYNTHROID TAKE 1 TABLET (75 MCG TOTAL) BY MOUTH DAILY BEFORE BREAKFAST.   Lidocaine 4 % Ptch Apply topically every morning.   Lidocaine 4 % Ptch Apply topically daily.     losartan 25 MG tablet Commonly known as: COZAAR Take 25 mg by mouth daily.   potassium chloride 10 MEQ tablet Commonly known as: KLOR-CON Take 10 mEq by mouth daily.   saccharomyces boulardii 250 MG capsule Commonly known as: FLORASTOR Take 250 mg by mouth 2 (two) times daily.   sertraline 25 MG tablet Commonly known as: ZOLOFT Take 25 mg by mouth daily. What changed: Another medication with the same name was removed. Continue taking this medication, and follow the directions you see here. Changed by: [name], MD   traMADol 50 MG tablet Commonly known as: ULTRAM Take 25 mg by mouth every 6 (six) hours as needed.   Vitamin D3 50 MCG (2000 UT) Tabs Take 1 tablet by mouth daily.   zinc oxide 20 % ointment Apply 1 application topically as needed for irritation.       Review of Systems  Constitutional: Positive for activity change and appetite change.  HENT: Negative.   Respiratory: Negative.   Cardiovascular: Negative.   Gastrointestinal: Negative.   Genitourinary: Negative.   Musculoskeletal: Positive for back pain and gait problem.  Skin: Negative.   Neurological: Positive for weakness.  Psychiatric/Behavioral: Positive for confusion.    Immunization History  Administered Date(s) Administered  . DTaP 07/20/2013  . Influenza, High Dose Seasonal PF 12/07/2016  . Influenza,inj,Quad PF,6+ Mos 12/01/2017  . Influenza-Unspecified 11/20/2014, 12/12/2015, 11/20/2019  . Moderna SARS-COVID-2 Vaccination 03/05/2019, 04/02/2019  . PPD Test 08/06/2014  . Pneumococcal Conjugate-13 11/07/2013  . Pneumococcal Polysaccharide-23 03/22/2017  . Zoster 03/01/2006  . Zoster Recombinat (Shingrix) 06/07/2017, 08/26/2017   Pertinent  Health Maintenance Due  Topic Date Due  . OPHTHALMOLOGY EXAM  Never done  . HEMOGLOBIN A1C  06/12/2020  . FOOT EXAM  07/10/2020  . INFLUENZA VACCINE  Completed  . DEXA SCAN  Completed  . PNA vac Low Risk Adult  Completed   Fall Risk   12/19/2019 10/17/2019 08/22/2019 07/11/2019 06/13/2019  Falls in the past year? 0 0 0 0 0  Number falls in past yr: 0 0 0 0 0  Injury with Fall? - - 0 - -   Functional Status Survey:    Vitals:   01/17/20 0907  BP: (!) 165/88  Pulse: 100  Resp: 20  Temp: (!) 96.3 F (35.7 C)  SpO2: 96%  Weight: 144 lb (65.3 kg)  Height: 5' (1.524 m)   Body mass index is 28.12 kg/m. Physical Exam Vitals reviewed.  Constitutional:      Appearance: Normal appearance.  HENT:     Head: Normocephalic.     Nose: Nose normal.     Mouth/Throat:     Mouth: Mucous membranes are moist.     Pharynx: Oropharynx is clear.  Eyes:     Pupils: Pupils are equal, round, and reactive to  light.  Cardiovascular:     Rate and Rhythm: Normal rate and regular rhythm.     Pulses: Normal pulses.  Pulmonary:     Effort: Pulmonary effort is normal.     Breath sounds: Normal breath sounds.  Abdominal:     General: Abdomen is flat. Bowel sounds are normal.     Palpations: Abdomen is soft.  Musculoskeletal:        General: No swelling.     Cervical back: Neck supple.  Skin:    General: Skin is warm.  Neurological:     General: No focal deficit present.     Mental Status: She is alert.  Psychiatric:        Mood and Affect: Mood normal.        Thought Content: Thought content normal.     Labs reviewed: Recent Labs    12/13/19 0000 12/13/19 0000 12/26/19 2137 12/27/19 0410 01/10/20 0000  NA 141   < > 139 136 142  K 4.3   < > 3.6 4.5 3.3*  CL 104   < > 101 103 106  CO2 27  --   --  22 28*  GLUCOSE 130*  --  169* 127*  --   BUN 16   < > 16 17 27*  CREATININE 0.78  --  0.60 0.74 0.6  CALCIUM 9.0  --   --  8.8* 9.2   < > = values in this interval not displayed.   Recent Labs    04/04/19 0928 04/04/19 0928 08/16/19 0800 12/13/19 0000 01/10/20 0000  AST 19   < > _0 ALT 25   < > _1 ALKPHOS  --   --   --   --  124  BILITOT 0.5  --  0.4 0.4  --   PROT 6.4  --  6.4 6.5  --   ALBUMIN   --   --   --   --  3.5   < > = values in this interval not displayed.   Recent Labs    12/13/19 0000 12/13/19 0000 12/26/19 2122 12/26/19 2137 12/27/19 0640 12/27/19 0640 12/28/19 0604 12/29/19 0606 01/10/20 0000  WBC 9.6   < > 15.4*   < > 14.1*   < > 12.1* 10.0 14.3  NEUTROABS 5,846  --  13.4*  --   --   --   --   --  10,082.00  HGB 12.6   < > 13.5   < > 13.2   < > 12.5 12.2 13.6  HCT 39.7   < > 41.7   < > 41.3   < > 39.9 39.3 42  MCV 80.2   < > 79.1*   < > 80.8  --  80.8 81.5  --   PLT 318   < > 297   < > 256   < > 245 233 523*   < > = values in this interval not displayed.   Lab Results  Component Value Date   TSH 3.509 12/29/2019   Lab Results  Component Value Date   HGBA1C 6.8 (H) 12/13/2019   Lab Results  Component Value Date   CHOL 109 12/13/2019   HDL 36 (L) 12/13/2019   LDLCALC 51 12/13/2019   TRIG 132 12/13/2019   CHOLHDL 3.0 12/13/2019    Significant Diagnostic Results in last 30 days:  DG Lumbar Spine Complete  Result Date: 12/26/2019 CLINICAL DATA:  Unwitnessed fall, low  back pain EXAM: LUMBAR SPINE - COMPLETE 4+ VIEW COMPARISON:  None. FINDINGS: Five lumbar vertebrae. The osseous structures appear diffusely demineralized which may limit detection of small or nondisplaced fractures. Age-indeterminate deformity involving the superior endplate L4 which could be related to a broad-based Schmorl's node formation no acute fracture is not excluded particularly given some cortical step-off anteriorly. No other acute fracture or height loss is evident. Multilevel degenerative changes are present in the imaged portions of the spine. Lower thoracic flowing anterior osteophytosis, could suggest diffuse idiopathic skeletal hyperostosis (DISH). Interspinous arthrosis and facet arthropathy noted throughout the lumbar levels. Atherosclerotic calcification of the abdominal aorta. Remaining soft tissues are unremarkable. Calcified pelvic phleboliths. IMPRESSION: 1.  Age-indeterminate deformity involving the superior endplate L4 which could be related to acute injury or a broad-based Schmorl's node formation. Correlate for point tenderness and consider cross-sectional imaging which will be more sensitive and specific for subtle injury in this patient with diffuse bony demineralization and advanced degenerative change. 2. Multilevel features of discogenic, facet degenerative and interspinous arthrosis as above. 3.  Aortic Atherosclerosis (ICD10-I70.0). Electronically Signed   By: Lovena Le M.D.   On: 12/26/2019 21:06   CT Lumbar Spine Wo Contrast  Result Date: 12/26/2019 CLINICAL DATA:  Acute low back pain EXAM: CT LUMBAR SPINE WITHOUT CONTRAST TECHNIQUE: Multidetector CT imaging of the lumbar spine was performed without intravenous contrast administration. Multiplanar CT image reconstructions were also generated. COMPARISON:  None. FINDINGS: Segmentation: Standard Alignment: No static subluxation. Vertebrae: Acute incomplete burst fracture of L4 involving the anterior and posterior walls and the superior endplate. Minimal height loss and retropulsion. Chronic height loss of L5. Diffuse osteopenia. Paraspinal and other soft tissues: Calcific aortic atherosclerosis. Incompletely visualized cystic lesions of the upper pelvis. Disc levels: No spinal canal stenosis.  No neural impingement. IMPRESSION: 1. Acute incomplete burst fracture of L4 involving the anterior and posterior walls and the superior endplate. Minimal height loss and retropulsion. 2. Cystic masses of the upper pelvis, incompletely visualized. Pelvic ultrasound or CT abdomen/pelvis recommended. 3. No spinal canal stenosis. 4. Chronic height loss of L5. Aortic Atherosclerosis (ICD10-I70.0). Electronically Signed   By: Ulyses Jarred M.D.   On: 12/26/2019 23:22   US PELVIS (TRANSABDOMINAL ONLY)  Result Date: 12/28/2019 CLINICAL DATA:  Cyst seen on CT, MR EXAM: TRANSABDOMINAL ULTRASOUND OF PELVIS TECHNIQUE:  Transabdominal ultrasound examination of the pelvis was performed including evaluation of the uterus, ovaries, adnexal regions, and pelvic cul-de-sac. COMPARISON:  CT 12/27/2019 FINDINGS: Uterus Measurements: 6 x 2.9 x 3.9 cm = volume: 35 mL. Retroverted uterus. Atrophic appearance is quite typical for a patient of advanced age. Endometrium Thickness: 7.3 mm, top normal for patient age albeit with a small amount of endometrial fluid. Right ovary Not visualized on these transabdominal images. Left ovary Cystic lesion in the left adnexa measuring 5.8 x 5.4 x 5.2 cm with estimated volume of 81 mL. Normal ovarian tissue is not well visualized. No significant internal complexity. Other findings:  No abnormal free fluid. IMPRESSION: Redemonstration of a cystic lesion arising in the left adnexa without visualization of the normal ovarian parenchyma. Measures 5.8 cm on this examination. Consider GYN consult and followup US in 3-6 months, or pelvis MRI w/o and w/ contrast for improved characterization. Note: This recommendation does not apply to premenarchal patients or to those with increased risk (genetic, family history, elevated tumor markers or other high-risk factors) of ovarian cancer. Reference: Radiology 2019 Nov; 293(2):359-371. Top-normal endometrial thickness albeit with a small amount of fluid  within the endometrial canal warranting further evaluation. Endometrial sampling should be considered to exclude carcinoma. Nonvisualization of the right ovary. Retroverted, atrophic uterus. Electronically Signed   By: Lovena Le M.D.   On: 12/28/2019 17:08   CT ABDOMEN PELVIS W CONTRAST  Result Date: 12/27/2019 CLINICAL DATA:  Cystic pelvic mass seen on recent lumbar spine CT. EXAM: CT ABDOMEN AND PELVIS WITH CONTRAST TECHNIQUE: Multidetector CT imaging of the abdomen and pelvis was performed using the standard protocol following bolus administration of intravenous contrast. CONTRAST:  171m OMNIPAQUE IOHEXOL 300  MG/ML  SOLN COMPARISON:  Lumbar spine CT 12/26/2019. FINDINGS: Lower chest: Dependent atelectasis noted in the lower lungs bilaterally. Hepatobiliary: No suspicious focal abnormality within the liver parenchyma. Tiny rim calcified lesion along the posterior liver capsule likely sequelae of prior infection/inflammation. Gallbladder is surgically absent. No intrahepatic or extrahepatic biliary dilation. Pancreas: No focal mass lesion. No dilatation of the main duct. No intraparenchymal cyst. No peripancreatic edema. Spleen: No splenomegaly. No focal mass lesion. Adrenals/Urinary Tract: No adrenal nodule or mass. Kidneys unremarkable. No evidence for hydroureter. The urinary bladder appears normal for the degree of distention. Stomach/Bowel: Small hiatal hernia. Stomach otherwise unremarkable Duodenum is normally positioned as is the ligament of Treitz. No small bowel wall thickening. No small bowel dilatation. The terminal ileum is normal. The appendix is normal. No gross colonic mass. No colonic wall thickening. Diverticuli are seen scattered along the entire length of the colon without CT findings of diverticulitis. Vascular/Lymphatic: There is abdominal aortic atherosclerosis without aneurysm. There is no gastrohepatic or hepatoduodenal ligament lymphadenopathy. No retroperitoneal or mesenteric lymphadenopathy. No pelvic sidewall lymphadenopathy. Reproductive: The uterus is unremarkable. No right adnexal mass. 6.7 x 6.2 x 6.0 cm homogeneous water density lesion is identified in the left adnexal space. The left gonadal vein tracks directly into the lateral margin of this finding suggesting left ovarian origin. No evidence for wall thickening, mural nodule, or internal architecture. Other: No intraperitoneal free fluid. Musculoskeletal: No worrisome lytic or sclerotic osseous abnormality. As noted on recent lumbar spine CT, there is an acute L4 burst fracture, better characterized on the prior study. Chronic loss of  vertebral body height noted at L5. IMPRESSION: 1. 6.7 x 6.2 x 6.0 cm benign appearing cystic lesion identified in the left ovary. Nonemergent pelvic ultrasound could be used to further evaluate as clinically warranted. This recommendation follows ACR consensus guidelines: White Paper of the ACR Incidental Findings Committee II on Adnexal Findings. J Am Coll Radiol 2013:10:675-681. 2. Small hiatal hernia. 3. Diffuse colonic diverticulosis without diverticulitis. 4. Aortic Atherosclerosis (ICD10-I70.0). Electronically Signed   By: EMisty StanleyM.D.   On: 12/27/2019 05:37    Assessment/Plan Pneumonia of right lower lobe due to infectious organism Finishing Doxycyline today Burst fracture of lumbar vertebra,  Therapy Pain controlled with Tylenol Diabetes mellitus type 2 in obese (HCC) On Amaryl; CBG in 150 Dementia without behavioral disturbance, unspecified dementia type ([place] Continue to follow Supportive care Confusion MS better now Depression, major, single episode, mild ([place] On Zoloft Hypertension Change Cozaar to  BID    Family/ staff Communication: Discussed in detail with the daughter  Labs/tests ordered:

## 2020-01-18 ENCOUNTER — Non-Acute Institutional Stay (SKILLED_NURSING_FACILITY): Payer: Medicare Other | Admitting: Nurse Practitioner

## 2020-01-18 ENCOUNTER — Encounter: Payer: Self-pay | Admitting: Nurse Practitioner

## 2020-01-18 DIAGNOSIS — E669 Obesity, unspecified: Secondary | ICD-10-CM

## 2020-01-18 DIAGNOSIS — K625 Hemorrhage of anus and rectum: Secondary | ICD-10-CM

## 2020-01-18 DIAGNOSIS — E038 Other specified hypothyroidism: Secondary | ICD-10-CM | POA: Diagnosis not present

## 2020-01-18 DIAGNOSIS — F418 Other specified anxiety disorders: Secondary | ICD-10-CM

## 2020-01-18 DIAGNOSIS — J189 Pneumonia, unspecified organism: Secondary | ICD-10-CM | POA: Diagnosis not present

## 2020-01-18 DIAGNOSIS — N838 Other noninflammatory disorders of ovary, fallopian tube and broad ligament: Secondary | ICD-10-CM

## 2020-01-18 DIAGNOSIS — R269 Unspecified abnormalities of gait and mobility: Secondary | ICD-10-CM

## 2020-01-18 DIAGNOSIS — F0391 Unspecified dementia with behavioral disturbance: Secondary | ICD-10-CM

## 2020-01-18 DIAGNOSIS — S32001A Stable burst fracture of unspecified lumbar vertebra, initial encounter for closed fracture: Secondary | ICD-10-CM

## 2020-01-18 DIAGNOSIS — E782 Mixed hyperlipidemia: Secondary | ICD-10-CM

## 2020-01-18 DIAGNOSIS — E876 Hypokalemia: Secondary | ICD-10-CM

## 2020-01-18 DIAGNOSIS — E1169 Type 2 diabetes mellitus with other specified complication: Secondary | ICD-10-CM

## 2020-01-18 NOTE — Assessment & Plan Note (Addendum)
Will update CBC/diff. No apparent hemorrhoids, but excoriated anus highly suspected cause of bright blood seen yesterday. The patient is incontinent of BM. Has stools on both hands upon my examination. Her finger nails are long-may scratched her anus?.  FOBT negative per nurse.

## 2020-01-18 NOTE — Assessment & Plan Note (Signed)
Hypothyroidism, TSH 3.5 12/29/19, takes Levothyroxine

## 2020-01-18 NOTE — Assessment & Plan Note (Signed)
T2DM, Hgb a1c 6.8 12/13/19, takes Glimepiride 20mg  qd.

## 2020-01-18 NOTE — Assessment & Plan Note (Signed)
01/10/20, leukocytosis, CXR 01/11/20 showed mildly patchy right lower lung opacity, negative COVID. Treated with total 7 day course of Doxycycline started 01/11/20

## 2020-01-18 NOTE — Assessment & Plan Note (Signed)
Hospitalized 12/26/19-12/30/19 For L4 burst fracture of lumbar vertebra identified on CT 12/27/19, non surgical management, lumbar brace when up and walking, , f/u neurosurgery in 1-2 weeks, Tramadol prn, Tylenol, Lidocaine patch for pain

## 2020-01-18 NOTE — Assessment & Plan Note (Signed)
Dementia with mild delirium, stopped Memantine per Dtr's request due to dizziness as possible side effect, declined Seroquel

## 2020-01-18 NOTE — Assessment & Plan Note (Signed)
Left ovarian cystic mass, had pelvic US, f/u Gyn

## 2020-01-18 NOTE — Assessment & Plan Note (Signed)
Depression/axniety, takes Sertraline.

## 2020-01-18 NOTE — Assessment & Plan Note (Signed)
Hyperlipidemia, takes Atorvastain

## 2020-01-18 NOTE — Assessment & Plan Note (Signed)
Gait abnormality, working with therapy.

## 2020-01-18 NOTE — Progress Notes (Addendum)
Location:    [facility] Room Number: 39 Place of Service:  SNF (31) Provider:  Marda Stalker, Lennie Odor NP  [name], MD  Patient Care Team: [name], MD as PCP - General (Internal Medicine)  Extended Emergency Contact Information Primary Emergency Contact: Harris,Margaret Address: [place], [place] [address] [name] [place] Phone: [phone] Mobile Phone: [phone] Relation: Daughter  Code Status:  DNR Goals of care: Advanced Directive information Advanced Directives 01/02/2020  Does Patient Have a Medical Advance Directive? Yes  Type of Advance Directive Living will;Healthcare Power of Attorney  Does patient want to make changes to medical advance directive? No - Patient declined  Copy of [place] in Chart? Yes - validated most recent copy scanned in chart (See row information)  Would patient like information on creating a medical advance directive? -     Chief Complaint  Patient presents with  . Acute Visit    Bright blood in stool    HPI:  Pt is a 86 y.o. female seen today for an acute visit for reported x1 bright red blood in stool, no further occurrence today. The patient denied nausea, vomiting, abd or rectal pain.      01/10/20, leukocytosis, CXR 01/11/20 showed mildly patchy right lower lung opacity, negative COVID. Treated with total 7 day course of Doxycycline started 01/11/20             Hospitalized 12/26/19-12/30/19 For L4 burst fracture of lumbar vertebra identified on CT 12/27/19, non surgical management, lumbar brace when up and walking, , f/u neurosurgery in 1-2 weeks, Tramadol prn, Tylenol, Lidocaine patch for pain Hypothyroidism, TSH 3.5 12/29/19, takes Levothyroxine T2DM, Hgb a1c 6.8 12/13/19, takes Glimepiride 4m qd.  Hyperlipidemia, takes Atorvastain Depression/axniety, takes Sertraline.  Dementia with mild  delirium, stopped Memantine per Dtr's request due to dizziness as possible side effect, declined Seroquel Gait abnormality, working with therapy.  Left ovarian cystic mass, had pelvic [place] f/u Gyn         Past Medical History:  Diagnosis Date  . Hearing loss   . History of fracture of clavicle   . Hypothyroidism   . Insomnia   . Tinnitus    Past Surgical History:  Procedure Laterality Date  . CHOLECYSTECTOMY  2007   Dr. HVerdene Lennert . CLAVICLE EXCISION  1986  . MOHS SURGERY     past 10 years chest & legs    Allergies  Allergen Reactions  . Metformin And Related Diarrhea    Allergies as of 01/18/2020      Reactions   Metformin And Related Diarrhea      Medication List       Accurate as of January 18, 2020 11:59 PM. If you have any questions, ask your nurse or doctor.        acetaminophen 325 MG tablet Commonly known as: TYLENOL Take 650 mg by mouth in the morning, at noon, and at bedtime.   atorvastatin 10 MG tablet Commonly known as: LIPITOR Take 10 mg by mouth daily.   Blood Glucose Monitoring Suppl Kit Use to test blood sugar once daily daily. Dx: E11.69   Boost Glucose Control Liqd Take by mouth. 1 can three times a day between meals   glimepiride 2 MG tablet Commonly known as: AMARYL Take 2 mg by mouth daily with breakfast.   glucose blood test strip Use to test blood sugar once daily.  Dx: E11.69   Lancets 30G Misc Use to test blood sugar once daily. Dx: E11.69   Lancets Misc. Kit 1 Device by Does not apply route daily. Use as directed   levothyroxine 75 MCG tablet Commonly known as: SYNTHROID TAKE 1 TABLET (75 MCG TOTAL) BY MOUTH DAILY BEFORE BREAKFAST.   Lidocaine 4 % Ptch Apply topically every morning.   Lidocaine 4 % Ptch Apply topically daily.   losartan 25 MG tablet Commonly known as: COZAAR Take 25 mg by mouth in the morning and at bedtime.   potassium chloride 10 MEQ tablet Commonly known as: KLOR-CON Take  10 mEq by mouth daily.   saccharomyces boulardii 250 MG capsule Commonly known as: FLORASTOR Take 250 mg by mouth 2 (two) times daily.   sertraline 25 MG tablet Commonly known as: ZOLOFT Take 25 mg by mouth daily.   traMADol 50 MG tablet Commonly known as: ULTRAM Take 25 mg by mouth every 6 (six) hours as needed.   Vitamin D3 50 MCG (2000 UT) Tabs Take 1 tablet by mouth daily.   zinc oxide 20 % ointment Apply 1 application topically as needed for irritation.       Review of Systems  Constitutional: Positive for appetite change, fatigue and unexpected weight change. Negative for fever.       ? Lost #6Ibs in 3 days.   HENT: Positive for hearing loss. Negative for congestion and voice change.   Eyes: Negative for visual disturbance.  Respiratory: Negative for cough, shortness of breath and wheezing.   Cardiovascular: Negative for chest pain, palpitations and leg swelling.  Gastrointestinal: Positive for blood in stool. Negative for abdominal distention, abdominal pain, constipation, nausea and vomiting.       Reported bright blood in stool 01/17/20 x1.   Genitourinary: Negative for difficulty urinating, dysuria and urgency.  Musculoskeletal: Positive for arthralgias, back pain and gait problem.  Skin: Negative for color change.  Neurological: Negative for speech difficulty, weakness, light-headedness and headaches.       Confused.   Psychiatric/Behavioral: Positive for behavioral problems and confusion. Negative for agitation, hallucinations and sleep disturbance. The patient is not nervous/anxious.     Immunization History  Administered Date(s) Administered  . DTaP 07/20/2013  . Influenza, High Dose Seasonal PF 12/07/2016  . Influenza,inj,Quad PF,6+ Mos 12/01/2017  . Influenza-Unspecified 11/20/2014, 12/12/2015, 11/20/2019  . Moderna SARS-COVID-2 Vaccination 03/05/2019, 04/02/2019  . PPD Test 08/06/2014  . Pneumococcal Conjugate-13 11/07/2013  . Pneumococcal  Polysaccharide-23 03/22/2017  . Zoster 03/01/2006  . Zoster Recombinat (Shingrix) 06/07/2017, 08/26/2017   Pertinent  Health Maintenance Due  Topic Date Due  . OPHTHALMOLOGY EXAM  Never done  . HEMOGLOBIN A1C  06/12/2020  . FOOT EXAM  07/10/2020  . INFLUENZA VACCINE  Completed  . DEXA SCAN  Completed  . PNA vac Low Risk Adult  Completed   Fall Risk  12/19/2019 10/17/2019 08/22/2019 07/11/2019 06/13/2019  Falls in the past year? 0 0 0 0 0  Number falls in past yr: 0 0 0 0 0  Injury with Fall? - - 0 - -   Functional Status Survey:    Vitals:   01/18/20 1430  BP: (!) 142/76  Pulse: (!) 115  Resp: 18  Temp: 97.8 F (36.6 C)  SpO2: 97%  Weight: 144 lb (65.3 kg)  Height: 5' (1.524 m)   Body mass index is 28.12 kg/m. Physical Exam Vitals and nursing note reviewed.  Constitutional:      General: She is not in acute  distress.    Appearance: She is not ill-appearing.     Comments: She seems tired but stayed awake during today's examination.   HENT:     Head: Normocephalic and atraumatic.     Nose: Nose normal.     Mouth/Throat:     Mouth: Mucous membranes are moist.  Eyes:     Extraocular Movements: Extraocular movements intact.     Conjunctiva/sclera: Conjunctivae normal.     Pupils: Pupils are equal, round, and reactive to light.  Cardiovascular:     Rate and Rhythm: Normal rate and regular rhythm.     Heart sounds: No murmur heard.   Pulmonary:     Effort: Pulmonary effort is normal.     Breath sounds: No wheezing or rales.  Abdominal:     General: Bowel sounds are normal. There is no distension.     Palpations: Abdomen is soft.     Tenderness: There is no abdominal tenderness. There is no guarding or rebound.     Comments: No apparent hemorrhoids, but excoriated anus highly suspected cause of bright blood seen yesterday. The patient is incontinent of BM. Has stools on both hands upon my examination. Her fingers are long-may scratched her anus?.  FOBT negative per  nurse.   Musculoskeletal:        General: Tenderness present.     Cervical back: Normal range of motion and neck supple.     Right lower leg: No edema.     Left lower leg: No edema.     Comments: Lower back   Skin:    General: Skin is warm and dry.  Neurological:     General: No focal deficit present.     Mental Status: She is alert. Mental status is at baseline.     Motor: No weakness.     Coordination: Coordination abnormal.     Gait: Gait abnormal.     Comments: Oriented to person, followed simple directions.   Psychiatric:        Mood and Affect: Mood normal.        Behavior: Behavior normal.     Labs reviewed: Recent Labs    12/13/19 0000 12/13/19 0000 12/26/19 2137 12/27/19 0410 01/10/20 0000  NA 141   < > 139 136 142  K 4.3   < > 3.6 4.5 3.3*  CL 104   < > 101 103 106  CO2 27  --   --  22 28*  GLUCOSE 130*  --  169* 127*  --   BUN 16   < > 16 17 27*  CREATININE 0.78  --  0.60 0.74 0.6  CALCIUM 9.0  --   --  8.8* 9.2   < > = values in this interval not displayed.   Recent Labs    04/04/19 0928 04/04/19 0928 08/16/19 0800 12/13/19 0000 01/10/20 0000  AST 19   < > '14 15 18  ' ALT 25   < > '14 11 17  ' ALKPHOS  --   --   --   --  124  BILITOT 0.5  --  0.4 0.4  --   PROT 6.4  --  6.4 6.5  --   ALBUMIN  --   --   --   --  3.5   < > = values in this interval not displayed.   Recent Labs    12/13/19 0000 12/13/19 0000 12/26/19 2122 12/26/19 2137 12/27/19 0640 12/27/19 0640 12/28/19 0604 12/29/19 0606 01/10/20 0000  WBC 9.6   < > 15.4*   < > 14.1*   < > 12.1* 10.0 14.3  NEUTROABS 5,846  --  13.4*  --   --   --   --   --  10,082.00  HGB 12.6   < > 13.5   < > 13.2   < > 12.5 12.2 13.6  HCT 39.7   < > 41.7   < > 41.3   < > 39.9 39.3 42  MCV 80.2   < > 79.1*   < > 80.8  --  80.8 81.5  --   PLT 318   < > 297   < > 256   < > 245 233 523*   < > = values in this interval not displayed.   Lab Results  Component Value Date   TSH 3.509 12/29/2019   Lab  Results  Component Value Date   HGBA1C 6.8 (H) 12/13/2019   Lab Results  Component Value Date   CHOL 109 12/13/2019   HDL 36 (L) 12/13/2019   LDLCALC 51 12/13/2019   TRIG 132 12/13/2019   CHOLHDL 3.0 12/13/2019    Significant Diagnostic Results in last 30 days:  DG Lumbar Spine Complete  Result Date: 12/26/2019 CLINICAL DATA:  Unwitnessed fall, low back pain EXAM: LUMBAR SPINE - COMPLETE 4+ VIEW COMPARISON:  None. FINDINGS: Five lumbar vertebrae. The osseous structures appear diffusely demineralized which may limit detection of small or nondisplaced fractures. Age-indeterminate deformity involving the superior endplate L4 which could be related to a broad-based Schmorl's node formation no acute fracture is not excluded particularly given some cortical step-off anteriorly. No other acute fracture or height loss is evident. Multilevel degenerative changes are present in the imaged portions of the spine. Lower thoracic flowing anterior osteophytosis, could suggest diffuse idiopathic skeletal hyperostosis (DISH). Interspinous arthrosis and facet arthropathy noted throughout the lumbar levels. Atherosclerotic calcification of the abdominal aorta. Remaining soft tissues are unremarkable. Calcified pelvic phleboliths. IMPRESSION: 1. Age-indeterminate deformity involving the superior endplate L4 which could be related to acute injury or a broad-based Schmorl's node formation. Correlate for point tenderness and consider cross-sectional imaging which will be more sensitive and specific for subtle injury in this patient with diffuse bony demineralization and advanced degenerative change. 2. Multilevel features of discogenic, facet degenerative and interspinous arthrosis as above. 3.  Aortic Atherosclerosis (ICD10-I70.0). Electronically Signed   By: Lovena Le M.D.   On: 12/26/2019 21:06   CT Lumbar Spine Wo Contrast  Result Date: 12/26/2019 CLINICAL DATA:  Acute low back pain EXAM: CT LUMBAR SPINE  WITHOUT CONTRAST TECHNIQUE: Multidetector CT imaging of the lumbar spine was performed without intravenous contrast administration. Multiplanar CT image reconstructions were also generated. COMPARISON:  None. FINDINGS: Segmentation: Standard Alignment: No static subluxation. Vertebrae: Acute incomplete burst fracture of L4 involving the anterior and posterior walls and the superior endplate. Minimal height loss and retropulsion. Chronic height loss of L5. Diffuse osteopenia. Paraspinal and other soft tissues: Calcific aortic atherosclerosis. Incompletely visualized cystic lesions of the upper pelvis. Disc levels: No spinal canal stenosis.  No neural impingement. IMPRESSION: 1. Acute incomplete burst fracture of L4 involving the anterior and posterior walls and the superior endplate. Minimal height loss and retropulsion. 2. Cystic masses of the upper pelvis, incompletely visualized. Pelvic ultrasound or CT abdomen/pelvis recommended. 3. No spinal canal stenosis. 4. Chronic height loss of L5. Aortic Atherosclerosis (ICD10-I70.0). Electronically Signed   By: Ulyses Jarred M.D.   On: 12/26/2019 23:22   US  PELVIS (TRANSABDOMINAL ONLY)  Result Date: 12/28/2019 CLINICAL DATA:  Cyst seen on CT, MR EXAM: TRANSABDOMINAL ULTRASOUND OF PELVIS TECHNIQUE: Transabdominal ultrasound examination of the pelvis was performed including evaluation of the uterus, ovaries, adnexal regions, and pelvic cul-de-sac. COMPARISON:  CT 12/27/2019 FINDINGS: Uterus Measurements: 6 x 2.9 x 3.9 cm = volume: 35 mL. Retroverted uterus. Atrophic appearance is quite typical for a patient of advanced age. Endometrium Thickness: 7.3 mm, top normal for patient age albeit with a small amount of endometrial fluid. Right ovary Not visualized on these transabdominal images. Left ovary Cystic lesion in the left adnexa measuring 5.8 x 5.4 x 5.2 cm with estimated volume of 81 mL. Normal ovarian tissue is not well visualized. No significant internal  complexity. Other findings:  No abnormal free fluid. IMPRESSION: Redemonstration of a cystic lesion arising in the left adnexa without visualization of the normal ovarian parenchyma. Measures 5.8 cm on this examination. Consider GYN consult and followup US in 3-6 months, or pelvis MRI w/o and w/ contrast for improved characterization. Note: This recommendation does not apply to premenarchal patients or to those with increased risk (genetic, family history, elevated tumor markers or other high-risk factors) of ovarian cancer. Reference: Radiology 2019 Nov; 293(2):359-371. Top-normal endometrial thickness albeit with a small amount of fluid within the endometrial canal warranting further evaluation. Endometrial sampling should be considered to exclude carcinoma. Nonvisualization of the right ovary. Retroverted, atrophic uterus. Electronically Signed   By: Lovena Le M.D.   On: 12/28/2019 17:08   CT ABDOMEN PELVIS W CONTRAST  Result Date: 12/27/2019 CLINICAL DATA:  Cystic pelvic mass seen on recent lumbar spine CT. EXAM: CT ABDOMEN AND PELVIS WITH CONTRAST TECHNIQUE: Multidetector CT imaging of the abdomen and pelvis was performed using the standard protocol following bolus administration of intravenous contrast. CONTRAST:  159m OMNIPAQUE IOHEXOL 300 MG/ML  SOLN COMPARISON:  Lumbar spine CT 12/26/2019. FINDINGS: Lower chest: Dependent atelectasis noted in the lower lungs bilaterally. Hepatobiliary: No suspicious focal abnormality within the liver parenchyma. Tiny rim calcified lesion along the posterior liver capsule likely sequelae of prior infection/inflammation. Gallbladder is surgically absent. No intrahepatic or extrahepatic biliary dilation. Pancreas: No focal mass lesion. No dilatation of the main duct. No intraparenchymal cyst. No peripancreatic edema. Spleen: No splenomegaly. No focal mass lesion. Adrenals/Urinary Tract: No adrenal nodule or mass. Kidneys unremarkable. No evidence for hydroureter. The  urinary bladder appears normal for the degree of distention. Stomach/Bowel: Small hiatal hernia. Stomach otherwise unremarkable Duodenum is normally positioned as is the ligament of Treitz. No small bowel wall thickening. No small bowel dilatation. The terminal ileum is normal. The appendix is normal. No gross colonic mass. No colonic wall thickening. Diverticuli are seen scattered along the entire length of the colon without CT findings of diverticulitis. Vascular/Lymphatic: There is abdominal aortic atherosclerosis without aneurysm. There is no gastrohepatic or hepatoduodenal ligament lymphadenopathy. No retroperitoneal or mesenteric lymphadenopathy. No pelvic sidewall lymphadenopathy. Reproductive: The uterus is unremarkable. No right adnexal mass. 6.7 x 6.2 x 6.0 cm homogeneous water density lesion is identified in the left adnexal space. The left gonadal vein tracks directly into the lateral margin of this finding suggesting left ovarian origin. No evidence for wall thickening, mural nodule, or internal architecture. Other: No intraperitoneal free fluid. Musculoskeletal: No worrisome lytic or sclerotic osseous abnormality. As noted on recent lumbar spine CT, there is an acute L4 burst fracture, better characterized on the prior study. Chronic loss of vertebral body height noted at L5. IMPRESSION: 1. 6.7 x 6.2  x 6.0 cm benign appearing cystic lesion identified in the left ovary. Nonemergent pelvic ultrasound could be used to further evaluate as clinically warranted. This recommendation follows ACR consensus guidelines: White Paper of the ACR Incidental Findings Committee II on Adnexal Findings. J Am Coll Radiol 2013:10:675-681. 2. Small hiatal hernia. 3. Diffuse colonic diverticulosis without diverticulitis. 4. Aortic Atherosclerosis (ICD10-I70.0). Electronically Signed   By: Misty Stanley M.D.   On: 12/27/2019 05:37    Assessment/Plan Rectal bleed Will update CBC/diff. No apparent hemorrhoids, but  excoriated anus highly suspected cause of bright blood seen yesterday. The patient is incontinent of BM. Has stools on both hands upon my examination. Her finger nails are long-may scratched her anus?.  FOBT negative per nurse.    Right lower lobe pneumonia 01/10/20, leukocytosis, CXR 01/11/20 showed mildly patchy right lower lung opacity, negative COVID. Treated with total 7 day course of Doxycycline started 01/11/20   Burst fracture of lumbar vertebra, closed, initial encounter [facility]) Hospitalized 12/26/19-12/30/19 For L4 burst fracture of lumbar vertebra identified on CT 12/27/19, non surgical management, lumbar brace when up and walking, , f/u neurosurgery in 1-2 weeks, Tramadol prn, Tylenol, Lidocaine patch for pain   Hypothyroidism Hypothyroidism, TSH 3.5 12/29/19, takes Levothyroxine   Diabetes mellitus type 2 in obese (HCC) T2DM, Hgb a1c 6.8 12/13/19, takes Glimepiride 69m qd.    Mixed hyperlipidemia Hyperlipidemia, takes Atorvastain   Depression with anxiety Depression/axniety, takes Sertraline.  Dementia ([place] Dementia with mild delirium, stopped Memantine per Dtr's request due to dizziness as possible side effect, declined Seroquel   Gait abnormality Gait abnormality, working with therapy.   Left tubo-ovarian mass Left ovarian cystic mass, had pelvic [place] f/u Gyn           Hypokalemia K 3.3 01/10/20, takes Kcl, update CMP/eGFR next lab.      Family/ staff Communication:  Plan of care reviewed with the patient and charge nurse.   Labs/tests ordered:  CBC/diff, CMP/eGFR  Time spend 35 minutes.

## 2020-01-21 ENCOUNTER — Encounter: Payer: Self-pay | Admitting: Nurse Practitioner

## 2020-01-21 DIAGNOSIS — E876 Hypokalemia: Secondary | ICD-10-CM | POA: Insufficient documentation

## 2020-01-21 HISTORY — DX: Hypokalemia: E87.6

## 2020-01-21 LAB — CBC AND DIFFERENTIAL
HCT: 40 (ref 36–46)
Hemoglobin: 12.7 (ref 12.0–16.0)
Platelets: 331 (ref 150–399)
WBC: 9.2

## 2020-01-21 LAB — CBC: RBC: 5.06 (ref 3.87–5.11)

## 2020-01-21 LAB — BASIC METABOLIC PANEL
BUN: 12 (ref 4–21)
CO2: 27 — AB (ref 13–22)
Chloride: 106 (ref 99–108)
Creatinine: 0.7 (ref 0.5–1.1)
Glucose: 132
Potassium: 4.1 (ref 3.4–5.3)
Sodium: 141 (ref 137–147)

## 2020-01-21 LAB — COMPREHENSIVE METABOLIC PANEL WITH GFR: Calcium: 8.9 (ref 8.7–10.7)

## 2020-01-21 NOTE — Assessment & Plan Note (Signed)
K 3.3 01/10/20, takes Kcl, update CMP/eGFR next lab.

## 2020-01-26 ENCOUNTER — Other Ambulatory Visit: Payer: Self-pay | Admitting: Internal Medicine

## 2020-01-26 DIAGNOSIS — E1169 Type 2 diabetes mellitus with other specified complication: Secondary | ICD-10-CM

## 2020-01-28 LAB — BASIC METABOLIC PANEL
BUN: 11 (ref 4–21)
CO2: 26 — AB (ref 13–22)
Chloride: 108 (ref 99–108)
Creatinine: 0.5 (ref 0.5–1.1)
Glucose: 99
Potassium: 3.8 (ref 3.4–5.3)
Sodium: 143 (ref 137–147)

## 2020-01-28 LAB — CBC AND DIFFERENTIAL
HCT: 37 (ref 36–46)
Hemoglobin: 12 (ref 12.0–16.0)
Neutrophils Absolute: 4673
Platelets: 282 (ref 150–399)
WBC: 7.2

## 2020-01-28 LAB — COMPREHENSIVE METABOLIC PANEL: Calcium: 8.7 (ref 8.7–10.7)

## 2020-01-28 LAB — CBC: RBC: 4.75 (ref 3.87–5.11)

## 2020-01-30 DIAGNOSIS — F0280 Dementia in other diseases classified elsewhere without behavioral disturbance: Secondary | ICD-10-CM | POA: Diagnosis not present

## 2020-01-30 DIAGNOSIS — S32001A Stable burst fracture of unspecified lumbar vertebra, initial encounter for closed fracture: Secondary | ICD-10-CM | POA: Diagnosis not present

## 2020-01-30 DIAGNOSIS — N83202 Unspecified ovarian cyst, left side: Secondary | ICD-10-CM | POA: Diagnosis not present

## 2020-01-30 DIAGNOSIS — E782 Mixed hyperlipidemia: Secondary | ICD-10-CM | POA: Diagnosis not present

## 2020-01-30 DIAGNOSIS — R1319 Other dysphagia: Secondary | ICD-10-CM | POA: Diagnosis not present

## 2020-01-30 DIAGNOSIS — E1169 Type 2 diabetes mellitus with other specified complication: Secondary | ICD-10-CM | POA: Diagnosis not present

## 2020-01-30 DIAGNOSIS — R296 Repeated falls: Secondary | ICD-10-CM | POA: Diagnosis not present

## 2020-01-30 DIAGNOSIS — R000 Tachycardia, unspecified: Secondary | ICD-10-CM | POA: Diagnosis not present

## 2020-01-30 DIAGNOSIS — M5459 Other low back pain: Secondary | ICD-10-CM | POA: Diagnosis not present

## 2020-01-30 DIAGNOSIS — S32001S Stable burst fracture of unspecified lumbar vertebra, sequela: Secondary | ICD-10-CM | POA: Diagnosis not present

## 2020-01-30 DIAGNOSIS — E038 Other specified hypothyroidism: Secondary | ICD-10-CM | POA: Diagnosis not present

## 2020-01-30 DIAGNOSIS — R2681 Unsteadiness on feet: Secondary | ICD-10-CM | POA: Diagnosis not present

## 2020-01-30 DIAGNOSIS — F0391 Unspecified dementia with behavioral disturbance: Secondary | ICD-10-CM | POA: Diagnosis not present

## 2020-01-30 DIAGNOSIS — S32040A Wedge compression fracture of fourth lumbar vertebra, initial encounter for closed fracture: Secondary | ICD-10-CM | POA: Diagnosis not present

## 2020-01-30 DIAGNOSIS — F418 Other specified anxiety disorders: Secondary | ICD-10-CM | POA: Diagnosis not present

## 2020-01-30 DIAGNOSIS — K111 Hypertrophy of salivary gland: Secondary | ICD-10-CM | POA: Diagnosis not present

## 2020-01-30 DIAGNOSIS — E039 Hypothyroidism, unspecified: Secondary | ICD-10-CM | POA: Diagnosis not present

## 2020-01-30 DIAGNOSIS — R269 Unspecified abnormalities of gait and mobility: Secondary | ICD-10-CM | POA: Diagnosis not present

## 2020-01-30 DIAGNOSIS — I10 Essential (primary) hypertension: Secondary | ICD-10-CM | POA: Diagnosis not present

## 2020-01-30 DIAGNOSIS — G4700 Insomnia, unspecified: Secondary | ICD-10-CM | POA: Diagnosis not present

## 2020-01-30 DIAGNOSIS — R1310 Dysphagia, unspecified: Secondary | ICD-10-CM | POA: Diagnosis not present

## 2020-01-30 DIAGNOSIS — N838 Other noninflammatory disorders of ovary, fallopian tube and broad ligament: Secondary | ICD-10-CM | POA: Diagnosis not present

## 2020-01-30 DIAGNOSIS — R4789 Other speech disturbances: Secondary | ICD-10-CM | POA: Diagnosis not present

## 2020-01-30 DIAGNOSIS — M6281 Muscle weakness (generalized): Secondary | ICD-10-CM | POA: Diagnosis not present

## 2020-01-31 ENCOUNTER — Encounter: Payer: Self-pay | Admitting: Internal Medicine

## 2020-01-31 ENCOUNTER — Non-Acute Institutional Stay (SKILLED_NURSING_FACILITY): Payer: Medicare Other | Admitting: Internal Medicine

## 2020-01-31 DIAGNOSIS — E669 Obesity, unspecified: Secondary | ICD-10-CM

## 2020-01-31 DIAGNOSIS — F0391 Unspecified dementia with behavioral disturbance: Secondary | ICD-10-CM | POA: Diagnosis not present

## 2020-01-31 DIAGNOSIS — E119 Type 2 diabetes mellitus without complications: Secondary | ICD-10-CM

## 2020-01-31 DIAGNOSIS — E1169 Type 2 diabetes mellitus with other specified complication: Secondary | ICD-10-CM

## 2020-01-31 DIAGNOSIS — E782 Mixed hyperlipidemia: Secondary | ICD-10-CM | POA: Diagnosis not present

## 2020-01-31 DIAGNOSIS — F418 Other specified anxiety disorders: Secondary | ICD-10-CM

## 2020-01-31 DIAGNOSIS — R634 Abnormal weight loss: Secondary | ICD-10-CM

## 2020-01-31 DIAGNOSIS — S32001A Stable burst fracture of unspecified lumbar vertebra, initial encounter for closed fracture: Secondary | ICD-10-CM

## 2020-01-31 NOTE — Progress Notes (Signed)
Location: [facility] Room Number: 29 Place of Service:  SNF (31)  Provider:   Code Status:  Goals of Care:  Advanced Directives 01/02/2020  Does Patient Have a Medical Advance Directive? Yes  Type of Advance Directive Living will;Healthcare Power of Attorney  Does patient want to make changes to medical advance directive? No - Patient declined  Copy of [place] in Chart? Yes - validated most recent copy scanned in chart (See row information)  Would patient like information on creating a medical advance directive? -     Chief Complaint  Patient presents with  . Acute Visit    Discuss her behaviors , Weight loss and depression    HPI: [name] a 86 y.o. female seen today for an acute visit for Follow up of her active problems  Patient was admitted in the hospital from 10/27-10/31 with L4 burst fracture after the fall Patient has a history of type 2 diabetes, hyperlipidemia, depression, dementia, hypothyroidism, osteopenia, stress and urge urinary incontinence [name] here in SNF for therapy  L4 fracture Pain Controlled with only Tylenol Has brace she wears  Daughter wants her to follow with Neurosurgery  Still making no Progress with Therapy. Dependnet for her ADLS Dementia with Behavior Continues to be the main Issue Including agitation and screaming. Also Playing with her dirty diaper Depression Also issue Weight loss Has had significant weight loss almost t 10 lbs  Past Medical History:  Diagnosis Date  . Hearing loss   . History of fracture of clavicle   . Hypothyroidism   . Insomnia   . Tinnitus     Past Surgical History:  Procedure Laterality Date  . CHOLECYSTECTOMY  2007   Dr. Verdene Lennert  . CLAVICLE EXCISION  1986  . MOHS SURGERY     past 10 years chest & legs    Allergies  Allergen Reactions  . Metformin And Related Diarrhea    Outpatient Encounter Medications as of 01/31/2020  Medication Sig  .  acetaminophen (TYLENOL) 325 MG tablet Take 650 mg by mouth in the morning, at noon, and at bedtime.  Marland Kitchen atorvastatin (LIPITOR) 10 MG tablet Take 10 mg by mouth daily.  . Blood Glucose Monitoring Suppl KIT Use to test blood sugar once daily daily. Dx: E11.69  . Cholecalciferol (VITAMIN D3) 2000 units TABS Take 1 tablet by mouth daily.   Marland Kitchen glimepiride (AMARYL) 2 MG tablet Take 2 mg by mouth daily with breakfast.  . glucose blood test strip Use to test blood sugar once daily. Dx: E11.69  . Lancets 30G MISC Use to test blood sugar once daily. Dx: E11.69  . Lancets Misc. KIT 1 Device by Does not apply route daily. Use as directed  . levothyroxine (SYNTHROID) 75 MCG tablet TAKE 1 TABLET (75 MCG TOTAL) BY MOUTH DAILY BEFORE BREAKFAST.  Marland Kitchen Lidocaine 4 % PTCH Apply topically every morning.  Marland Kitchen losartan (COZAAR) 25 MG tablet Take 25 mg by mouth in the morning and at bedtime.   . Nutritional Supplements (BOOST GLUCOSE CONTROL) LIQD Take by mouth. 1 can three times a day between meals  . sertraline (ZOLOFT) 25 MG tablet Take 25 mg by mouth daily.  . traMADol (ULTRAM) 50 MG tablet Take 25 mg by mouth every 6 (six) hours as needed.   . zinc oxide 20 % ointment Apply 1 application topically as needed for irritation.  . [DISCONTINUED] Lidocaine 4 % PTCH Apply topically daily.  . [DISCONTINUED] memantine (NAMENDA  XR) 14 MG CP24 24 hr capsule Take 14 mg by mouth at bedtime.  . [DISCONTINUED] omega-3 acid ethyl esters (LOVAZA) 1 g capsule Take 1 capsule by mouth at bedtime.  . [DISCONTINUED] ondansetron (ZOFRAN) 4 MG tablet Take by mouth.  . [DISCONTINUED] potassium chloride (KLOR-CON) 10 MEQ tablet Take 10 mEq by mouth daily.   No facility-administered encounter medications on file as of 01/31/2020.    Review of Systems:  Review of Systems  Constitutional: Positive for activity change, appetite change and unexpected weight change.  HENT: Negative.   Respiratory: Negative.   Cardiovascular: Negative.     Gastrointestinal: Negative.   Genitourinary: Negative.   Musculoskeletal: Positive for arthralgias, back pain, gait problem and myalgias.  Skin: Negative.   Neurological: Positive for weakness.  Psychiatric/Behavioral: Positive for behavioral problems, confusion and dysphoric mood.    Health Maintenance  Topic Date Due  . OPHTHALMOLOGY EXAM  Never done  . TETANUS/TDAP  11/24/2026 (Originally 08/19/1947)  . HEMOGLOBIN A1C  06/12/2020  . FOOT EXAM  07/10/2020  . INFLUENZA VACCINE  Completed  . DEXA SCAN  Completed  . COVID-19 Vaccine  Completed  . PNA vac Low Risk Adult  Completed    Physical Exam: Vitals:   01/31/20 1453  BP: 137/90  Pulse: 100  Resp: 18  Temp: (!) 97.4 F (36.3 C)  SpO2: 94%  Weight: 141 lb 8 oz (64.2 kg)  Height: 5' (1.524 m)   Body mass index is 27.63 kg/m. Physical Exam Constitutional: . Well-developed and well-nourished.  HENT:  Head: Normocephalic.  Mouth/Throat: Oropharynx is clear and moist.  Eyes: Pupils are equal, round, and reactive to light.  Neck: Neck supple.  Cardiovascular: Normal rate and normal heart sounds.  No murmur heard. Pulmonary/Chest: Effort normal and breath sounds normal. No respiratory distress. No wheezes. She has no rales.  Abdominal: Soft. Bowel sounds are normal. No distension. There is no tenderness. There is no rebound.  Musculoskeletal: No edema.  Lymphadenopathy: none Neurological: No Focal deficits  Skin: Skin is warm and dry.  Psychiatric: Normal mood and affect. Behavior is normal. Thought content normal.   Labs reviewed: Basic Metabolic Panel: Recent Labs    04/04/19 0928 08/16/19 0800 12/13/19 0000 12/13/19 0000 12/26/19 2137 12/27/19 0410 12/29/19 0606 01/10/20 0000  NA 140   < > 141   < > 139 136  --  142  K 3.9   < > 4.3   < > 3.6 4.5  --  3.3*  CL 105   < > 104   < > 101 103  --  106  CO2 27   < > 27  --   --  22  --  28*  GLUCOSE 234*   < > 130*  --  169* 127*  --   --   BUN 16   < > 16    < > 16 17  --  27*  CREATININE 0.79   < > 0.78  --  0.60 0.74  --  0.6  CALCIUM 8.7   < > 9.0  --   --  8.8*  --  9.2  TSH 3.06  --   --   --   --   --  3.509  --    < > = values in this interval not displayed.   Liver Function Tests: Recent Labs    04/04/19 0928 04/04/19 0928 08/16/19 0800 12/13/19 0000 01/10/20 0000  AST 19   < > 14 15  18  ALT 25   < > _0 ALKPHOS  --   --   --   --  124  BILITOT 0.5  --  0.4 0.4  --   PROT 6.4  --  6.4 6.5  --   ALBUMIN  --   --   --   --  3.5   < > = values in this interval not displayed.   No results for input(s): LIPASE, AMYLASE in the last 8760 hours. No results for input(s): AMMONIA in the last 8760 hours. CBC: Recent Labs    12/13/19 0000 12/13/19 0000 12/26/19 2122 12/26/19 2137 12/27/19 0640 12/27/19 0640 12/28/19 0604 12/29/19 0606 01/10/20 0000  WBC 9.6   < > 15.4*   < > 14.1*   < > 12.1* 10.0 14.3  NEUTROABS 5,846  --  13.4*  --   --   --   --   --  10,082.00  HGB 12.6   < > 13.5   < > 13.2   < > 12.5 12.2 13.6  HCT 39.7   < > 41.7   < > 41.3   < > 39.9 39.3 42  MCV 80.2   < > 79.1*   < > 80.8  --  80.8 81.5  --   PLT 318   < > 297   < > 256   < > 245 233 523*   < > = values in this interval not displayed.   Lipid Panel: Recent Labs    04/04/19 0928 12/13/19 0000  CHOL 108 109  HDL 30* 36*  LDLCALC 56 51  TRIG 138 132  CHOLHDL 3.6 3.0   Lab Results  Component Value Date   HGBA1C 6.8 (H) 12/13/2019    Procedures since last visit: No results found.  Assessment/Plan Dementia with behavioral disturbance, unspecified dementia type ([place]) Continues to be Issues D/W daughter has agreed to start low dose of Seroquel 12.5 mg QHS Burst fracture of lumbar vertebra, Pain Control Not making much Progress with therapy ? Cognition Follow up with Neurosurgery Mixed hyperlipidemia On Statin Diabetes mellitus type 2 in obese (HCC) AMaryl Weight loss D/W dietary for supplements Hold Remeron for now as she  is already sleeping alot Depression with anxiety Increase Zoloft to 50 mg Hypertension Controlled on Cozaar   Labs/tests ordered:  * No order type specified * Next appt:  04/17/2020 D/w the daughter

## 2020-02-04 ENCOUNTER — Non-Acute Institutional Stay (SKILLED_NURSING_FACILITY): Payer: Medicare Other | Admitting: Nurse Practitioner

## 2020-02-04 ENCOUNTER — Encounter: Payer: Self-pay | Admitting: Nurse Practitioner

## 2020-02-04 DIAGNOSIS — E782 Mixed hyperlipidemia: Secondary | ICD-10-CM

## 2020-02-04 DIAGNOSIS — N838 Other noninflammatory disorders of ovary, fallopian tube and broad ligament: Secondary | ICD-10-CM

## 2020-02-04 DIAGNOSIS — S32001S Stable burst fracture of unspecified lumbar vertebra, sequela: Secondary | ICD-10-CM

## 2020-02-04 DIAGNOSIS — F0391 Unspecified dementia with behavioral disturbance: Secondary | ICD-10-CM

## 2020-02-04 DIAGNOSIS — E038 Other specified hypothyroidism: Secondary | ICD-10-CM

## 2020-02-04 DIAGNOSIS — F418 Other specified anxiety disorders: Secondary | ICD-10-CM

## 2020-02-04 DIAGNOSIS — F05 Delirium due to known physiological condition: Secondary | ICD-10-CM

## 2020-02-04 DIAGNOSIS — E1169 Type 2 diabetes mellitus with other specified complication: Secondary | ICD-10-CM | POA: Diagnosis not present

## 2020-02-04 DIAGNOSIS — E669 Obesity, unspecified: Secondary | ICD-10-CM

## 2020-02-04 DIAGNOSIS — I10 Essential (primary) hypertension: Secondary | ICD-10-CM | POA: Diagnosis not present

## 2020-02-04 DIAGNOSIS — R269 Unspecified abnormalities of gait and mobility: Secondary | ICD-10-CM

## 2020-02-04 DIAGNOSIS — F03918 Unspecified dementia, unspecified severity, with other behavioral disturbance: Secondary | ICD-10-CM

## 2020-02-04 NOTE — Assessment & Plan Note (Addendum)
T2DM, Hgb a1c 6.8 12/13/19, takes Glimepiride 20mg  qd. Eye exam when the patient is able.

## 2020-02-04 NOTE — Assessment & Plan Note (Signed)
Hypothyroidism, TSH 3.5 12/29/19, takes Levothyroxine

## 2020-02-04 NOTE — Assessment & Plan Note (Signed)
Hospitalized 12/26/19-12/30/19 For L4 burst fracture of lumbar vertebra identified on CT 12/27/19,non surgical management, lumbar brace when up and walking, , f/u neurosurgery,  Tramadol prn, Tylenol, Lidocaine patchfor pain

## 2020-02-04 NOTE — Assessment & Plan Note (Signed)
Depression/axniety, takes Sertraline.

## 2020-02-04 NOTE — Assessment & Plan Note (Signed)
Gait abnormality, working with therapy.

## 2020-02-04 NOTE — Assessment & Plan Note (Signed)
HTN, takes Losartan.

## 2020-02-04 NOTE — Progress Notes (Signed)
Location:    [facility] Room Number: 39 Place of Service:  SNF (31) Provider:  Marda Stalker, Lennie Odor NP  [name], MD  Patient Care Team: [name], MD as PCP - General (Internal Medicine)  Extended Emergency Contact Information Primary Emergency Contact: Harris,Margaret Address: [place], [place] [address] [name] Phone: [phone] Mobile Phone: [phone] Relation: Daughter  Code Status:  DNR Goals of care: Advanced Directive information Advanced Directives 02/04/2020  Does Patient Have a Medical Advance Directive? Yes  Type of Advance Directive Out of facility DNR (pink MOST or yellow form);Living will;Healthcare Power of Attorney  Does patient want to make changes to medical advance directive? No - Patient declined  Copy of [place] in Chart? Yes - validated most recent copy scanned in chart (See row information)  Would patient like information on creating a medical advance directive? -  Pre-existing out of facility DNR order (yellow form or pink MOST form) Yellow form placed in chart (order not valid for inpatient use)     Chief Complaint  Patient presents with  . Medical Management of Chronic Issues  . Health Maintenance    Eye exam    HPI:  Pt is a 86 y.o. female seen today for medical management of chronic diseases.    Hospitalized 12/26/19-12/30/19 For L4 burst fracture of lumbar vertebra identified on CT 12/27/19,non surgical management, lumbar brace when up and walking, , f/u neurosurgery,  Tramadol prn, Tylenol, Lidocaine patchfor pain Hypothyroidism, TSH 3.5 12/29/19, takes Levothyroxine T2DM, Hgb a1c 6.8 12/13/19, takes Glimepiride $RemoveBeforeDEI'20mg'[id number]$  qd.   HTN, takes Losartan.  Hyperlipidemia, takes Atorvastain Depression/axniety, takes Sertraline.  Dementia with mild delirium, stopped Memantine per Dtr's request due to  dizziness as possible side effect, started  Seroquel 01/31/20 Gait abnormality, working with therapy.  Left ovarian cystic mass, had pelvic US, f/u Gyn  Past Medical History:  Diagnosis Date  . Hearing loss   . History of fracture of clavicle   . Hypothyroidism   . Insomnia   . Tinnitus    Past Surgical History:  Procedure Laterality Date  . CHOLECYSTECTOMY  2007   Dr. Verdene Lennert  . CLAVICLE EXCISION  1986  . MOHS SURGERY     past 10 years chest & legs    Allergies  Allergen Reactions  . Metformin And Related Diarrhea    Allergies as of 02/04/2020      Reactions   Metformin And Related Diarrhea      Medication List       Accurate as of February 04, 2020 11:59 PM. If you have any questions, ask your nurse or doctor.        acetaminophen 325 MG tablet Commonly known as: TYLENOL Take 650 mg by mouth in the morning, at noon, and at bedtime.   atorvastatin 10 MG tablet Commonly known as: LIPITOR Take 10 mg by mouth daily.   Blood Glucose Monitoring Suppl Kit Use to test blood sugar once daily daily. Dx: E11.69   Boost Glucose Control Liqd Take by mouth. 1 can three times a day between meals   glimepiride 2 MG tablet Commonly known as: AMARYL Take 2 mg by mouth daily with breakfast.   glucose blood test strip Use to test blood sugar once daily. Dx: E11.69   Lancets 30G Misc Use to test blood sugar once daily. Dx: E11.69   Lancets Misc. Kit 1 Device  by Does not apply route daily. Use as directed   levothyroxine 75 MCG tablet Commonly known as: SYNTHROID TAKE 1 TABLET (75 MCG TOTAL) BY MOUTH DAILY BEFORE BREAKFAST.   Lidocaine 4 % Ptch Apply topically every morning.   losartan 25 MG tablet Commonly known as: COZAAR Take 25 mg by mouth in the morning and at bedtime.   QUEtiapine 25 MG tablet Commonly known as: SEROQUEL Take 12.5 mg by mouth at bedtime.   sertraline 25 MG tablet Commonly known as: ZOLOFT Take 50 mg by  mouth daily.   traMADol 50 MG tablet Commonly known as: ULTRAM Take 25 mg by mouth every 6 (six) hours as needed.   Vitamin D3 50 MCG (2000 UT) Tabs Take 1 tablet by mouth daily.   zinc oxide 20 % ointment Apply 1 application topically as needed for irritation.       Review of Systems  Constitutional: Positive for unexpected weight change. Negative for fatigue and fever.       Weight loss about #3Ibs in the past month  HENT: Positive for hearing loss. Negative for congestion and voice change.   Eyes: Negative for visual disturbance.  Respiratory: Negative for cough, shortness of breath and wheezing.   Cardiovascular: Negative for chest pain, palpitations and leg swelling.  Gastrointestinal: Positive for blood in stool. Negative for abdominal pain and constipation.       Reported bright blood in stool 01/17/20 x1.   Genitourinary: Negative for difficulty urinating, dysuria and urgency.  Musculoskeletal: Positive for arthralgias, back pain and gait problem.  Skin: Negative for color change.  Neurological: Negative for speech difficulty, weakness, light-headedness and headaches.       Confused.   Psychiatric/Behavioral: Positive for behavioral problems and confusion. Negative for sleep disturbance. The patient is not nervous/anxious.     Immunization History  Administered Date(s) Administered  . DTaP 07/20/2013  . Influenza, High Dose Seasonal PF 12/07/2016  . Influenza,inj,Quad PF,6+ Mos 12/01/2017  . Influenza-Unspecified 11/20/2014, 12/12/2015, 11/20/2019  . Moderna SARS-COVID-2 Vaccination 03/05/2019, 04/02/2019  . PPD Test 08/06/2014  . Pneumococcal Conjugate-13 11/07/2013  . Pneumococcal Polysaccharide-23 03/22/2017  . Zoster 03/01/2006  . Zoster Recombinat (Shingrix) 06/07/2017, 08/26/2017   Pertinent  Health Maintenance Due  Topic Date Due  . OPHTHALMOLOGY EXAM  Never done  . HEMOGLOBIN A1C  06/12/2020  . FOOT EXAM  07/10/2020  . INFLUENZA VACCINE  Completed  .  DEXA SCAN  Completed  . PNA vac Low Risk Adult  Completed   Fall Risk  12/19/2019 10/17/2019 08/22/2019 07/11/2019 06/13/2019  Falls in the past year? 0 0 0 0 0  Number falls in past yr: 0 0 0 0 0  Injury with Fall? - - 0 - -   Functional Status Survey:    Vitals:   02/04/20 1027  BP: 139/76  Pulse: 100  Resp: 20  Temp: 97.6 F (36.4 C)  SpO2: 95%  Weight: 141 lb 8 oz (64.2 kg)  Height: 5' (1.524 m)   Body mass index is 27.63 kg/m. Physical Exam Vitals and nursing note reviewed.  Constitutional:      Appearance: Normal appearance.     Comments: She seems tired but stayed awake during today's examination.   HENT:     Head: Normocephalic and atraumatic.     Mouth/Throat:     Mouth: Mucous membranes are moist.  Eyes:     Extraocular Movements: Extraocular movements intact.     Conjunctiva/sclera: Conjunctivae normal.     Pupils: Pupils  are equal, round, and reactive to light.  Cardiovascular:     Rate and Rhythm: Normal rate and regular rhythm.     Heart sounds: No murmur heard.   Pulmonary:     Effort: Pulmonary effort is normal.     Breath sounds: No rales.  Abdominal:     General: Bowel sounds are normal.     Palpations: Abdomen is soft.     Tenderness: There is no abdominal tenderness.     Comments: No apparent hemorrhoids, but excoriated anus highly suspected cause of bright blood seen yesterday. The patient is incontinent of BM. Has stools on both hands upon my examination. Her fingers are long-may scratched her anus?.  FOBT negative per nurse.   Musculoskeletal:        General: Tenderness present.     Cervical back: Normal range of motion and neck supple.     Right lower leg: No edema.     Left lower leg: No edema.     Comments: Lower back   Skin:    General: Skin is warm and dry.  Neurological:     General: No focal deficit present.     Mental Status: She is alert. Mental status is at baseline.     Motor: No weakness.     Coordination: Coordination  normal.     Gait: Gait abnormal.     Comments: Oriented to person, followed simple directions.   Psychiatric:        Mood and Affect: Mood normal.        Behavior: Behavior normal.     Labs reviewed: Recent Labs    12/13/19 0000 12/13/19 0000 12/26/19 2137 12/26/19 2137 12/27/19 0410 12/27/19 0410 01/10/20 0000 01/21/20 0000 01/28/20 0000  NA 141   < > 139   < > 136  --  142 141 143  K 4.3   < > 3.6   < > 4.5   < > 3.3* 4.1 3.8  CL 104   < > 101   < > 103   < > 106 106 108  CO2 27   < >  --   --  22   < > 28* 27* 26*  GLUCOSE 130*  --  169*  --  127*  --   --   --   --   BUN 16   < > 16   < > 17  --  27* 12 11  CREATININE 0.78  --  0.60   < > 0.74  --  0.6 0.7 0.5  CALCIUM 9.0   < >  --   --  8.8*   < > 9.2 8.9 8.7   < > = values in this interval not displayed.   Recent Labs    04/04/19 0928 04/04/19 0928 08/16/19 0800 12/13/19 0000 01/10/20 0000  AST 19   < > _0 ALT 25   < > _1 ALKPHOS  --   --   --   --  124  BILITOT 0.5  --  0.4 0.4  --   PROT 6.4  --  6.4 6.5  --   ALBUMIN  --   --   --   --  3.5   < > = values in this interval not displayed.   Recent Labs    12/26/19 2122 12/26/19 2137 12/27/19 3716 12/27/19 0640 12/28/19 0604 12/28/19 0604 12/29/19 0606 12/29/19 0606 01/10/20 0000 01/21/20 0000 01/28/20 0000  WBC 15.4*   < > 14.1*   < > 12.1*   < > 10.0  --  14.3 9.2 7.2  NEUTROABS 13.4*  --   --   --   --   --   --   --  10,082.00  --  4,673.00  HGB 13.5   < > 13.2   < > 12.5   < > 12.2   < > 13.6 12.7 12.0  HCT 41.7   < > 41.3   < > 39.9   < > 39.3   < > 42 40 37  MCV 79.1*   < > 80.8  --  80.8  --  81.5  --   --   --   --   PLT 297   < > 256   < > 245   < > 233   < > 523* 331 282   < > = values in this interval not displayed.   Lab Results  Component Value Date   TSH 3.509 12/29/2019   Lab Results  Component Value Date   HGBA1C 6.8 (H) 12/13/2019   Lab Results  Component Value Date   CHOL 109 12/13/2019   HDL 36 (L)  12/13/2019   LDLCALC 51 12/13/2019   TRIG 132 12/13/2019   CHOLHDL 3.0 12/13/2019    Significant Diagnostic Results in last 30 days:  No results found.  Assessment/Plan There are no diagnoses linked to this encounter.   Family/ staff Communication:   Labs/tests ordered:

## 2020-02-04 NOTE — Assessment & Plan Note (Signed)
Hyperlipidemia, takes Atorvastain

## 2020-02-04 NOTE — Assessment & Plan Note (Signed)
Dementia with mild delirium, stopped Memantine per Dtr's request due to dizziness as possible side effect, started  Seroquel 01/31/20

## 2020-02-04 NOTE — Assessment & Plan Note (Signed)
Left ovarian cystic mass, had pelvic US, f/u Gyn

## 2020-02-05 ENCOUNTER — Encounter: Payer: Self-pay | Admitting: Nurse Practitioner

## 2020-02-05 ENCOUNTER — Non-Acute Institutional Stay (SKILLED_NURSING_FACILITY): Payer: Medicare Other | Admitting: Nurse Practitioner

## 2020-02-05 DIAGNOSIS — E1169 Type 2 diabetes mellitus with other specified complication: Secondary | ICD-10-CM

## 2020-02-05 DIAGNOSIS — I10 Essential (primary) hypertension: Secondary | ICD-10-CM

## 2020-02-05 DIAGNOSIS — F418 Other specified anxiety disorders: Secondary | ICD-10-CM

## 2020-02-05 DIAGNOSIS — E669 Obesity, unspecified: Secondary | ICD-10-CM

## 2020-02-05 DIAGNOSIS — S32001S Stable burst fracture of unspecified lumbar vertebra, sequela: Secondary | ICD-10-CM

## 2020-02-05 DIAGNOSIS — R269 Unspecified abnormalities of gait and mobility: Secondary | ICD-10-CM | POA: Diagnosis not present

## 2020-02-05 DIAGNOSIS — E782 Mixed hyperlipidemia: Secondary | ICD-10-CM

## 2020-02-05 DIAGNOSIS — N838 Other noninflammatory disorders of ovary, fallopian tube and broad ligament: Secondary | ICD-10-CM

## 2020-02-05 DIAGNOSIS — R000 Tachycardia, unspecified: Secondary | ICD-10-CM | POA: Diagnosis not present

## 2020-02-05 DIAGNOSIS — E038 Other specified hypothyroidism: Secondary | ICD-10-CM

## 2020-02-05 NOTE — Assessment & Plan Note (Signed)
Blood pressure is controlled, continue Losartan

## 2020-02-05 NOTE — Assessment & Plan Note (Signed)
T2DM, Hgb a1c 6.8 12/13/19, takes Glimepiride 20mg  qd.

## 2020-02-05 NOTE — Assessment & Plan Note (Signed)
Hypothyroidism, TSH 3.5 12/29/19, takes Levothyroxine

## 2020-02-05 NOTE — Progress Notes (Signed)
Location:    [facility] Room Number: 39 Place of Service:  SNF (31) Provider:  Marda Stalker, Lennie Odor NP  [name], MD  Patient Care Team: [name], MD as PCP - General (Internal Medicine)  Extended Emergency Contact Information Primary Emergency Contact: Harris,Margaret Address: [address], [place] [address] [name] of [place] Phone: [phone] Mobile Phone: [phone] Relation: Daughter  Code Status:  DNR Goals of care: Advanced Directive information Advanced Directives 02/04/2020  Does Patient Have a Medical Advance Directive? Yes  Type of Advance Directive Out of facility DNR (pink MOST or yellow form);Living will;Healthcare Power of Attorney  Does patient want to make changes to medical advance directive? No - Patient declined  Copy of [place] in Chart? Yes - validated most recent copy scanned in chart (See row information)  Would patient like information on creating a medical advance directive? -  Pre-existing out of facility DNR order (yellow form or pink MOST form) Yellow form placed in chart (order not valid for inpatient use)     Chief Complaint  Patient presents with   Acute Visit    Tachycardia    HPI:  Pt is a 86 y.o. female seen today for an acute visit for tachycardia, HR 100-110 bpm at rest, the patient denied chest pain, palpitation, SOB, dizziness, or generalized malaise. She is afebrile, no cough or dysuria reported. EKG 12/27/19 HR 90bpm   Hospitalized 12/26/19-12/30/19 For L4 burst fracture of lumbar vertebra identified on CT 12/27/19,non surgical management, lumbar brace when up and walking, , f/u neurosurgery,  Tramadol prn, Tylenol, Lidocaine patchfor pain Hypothyroidism, TSH 3.5 12/29/19, takes Levothyroxine T2DM, Hgb a1c 6.8 12/13/19, takes Glimepiride 27m qd.              HTN, takes Losartan.  Hyperlipidemia, takes  Atorvastain Depression/axniety, takes Sertraline. The patient usually irritable, little patience in am when she had to wait to get ready for the day, otherwise she sleeps and eats well, smiles, follows simple direction.  Dementia with mild delirium, stopped Memantine per Dtr's request due to dizziness as possible side effect, started  Seroquel 01/31/20 Gait abnormality, working with therapy.  Left ovarian cystic mass, had pelvic [place] f/u Gyn    Past Medical History:  Diagnosis Date   Hearing loss    History of fracture of clavicle    Hypothyroidism    Insomnia    Tinnitus    Past Surgical History:  Procedure Laterality Date   CHOLECYSTECTOMY  2007   Dr. HVerdene Lennert  CLAVICLE EXCISION  1986   MOHS SURGERY     past 10 years chest & legs    Allergies  Allergen Reactions   Metformin And Related Diarrhea    Allergies as of 02/05/2020      Reactions   Metformin And Related Diarrhea      Medication List       Accurate as of February 05, 2020 11:59 PM. If you have any questions, ask your nurse or doctor.        acetaminophen 325 MG tablet Commonly known as: TYLENOL Take 650 mg by mouth in the morning, at noon, and at bedtime.   atorvastatin 10 MG tablet Commonly known as: LIPITOR Take 10 mg by mouth daily.   Blood Glucose Monitoring Suppl Kit Use to test blood sugar once daily daily. Dx: E11.69   Boost Glucose Control Liqd Take by mouth.  1 can three times a day between meals   glimepiride 2 MG tablet Commonly known as: AMARYL Take 2 mg by mouth daily with breakfast.   glucose blood test strip Use to test blood sugar once daily. Dx: E11.69   Lancets 30G Misc Use to test blood sugar once daily. Dx: E11.69   Lancets Misc. Kit 1 Device by Does not apply route daily. Use as directed   levothyroxine 75 MCG tablet Commonly known as: SYNTHROID TAKE 1 TABLET (75 MCG TOTAL) BY MOUTH DAILY BEFORE BREAKFAST.    Lidocaine 4 % Ptch Apply topically every morning.   losartan 25 MG tablet Commonly known as: COZAAR Take 25 mg by mouth in the morning and at bedtime.   QUEtiapine 25 MG tablet Commonly known as: SEROQUEL Take 12.5 mg by mouth at bedtime.   sertraline 25 MG tablet Commonly known as: ZOLOFT Take 50 mg by mouth daily.   traMADol 50 MG tablet Commonly known as: ULTRAM Take 25 mg by mouth every 6 (six) hours as needed.   Vitamin D3 50 MCG (2000 UT) Tabs Take 1 tablet by mouth daily.   zinc oxide 20 % ointment Apply 1 application topically as needed for irritation.       Review of Systems  Constitutional: Negative for fatigue and fever.       Weight loss about #3Ibs in the past month  HENT: Positive for hearing loss. Negative for congestion and voice change.   Eyes: Negative for visual disturbance.  Respiratory: Negative for cough, shortness of breath and wheezing.   Cardiovascular: Negative for chest pain, palpitations and leg swelling.  Gastrointestinal: Negative for abdominal pain and constipation.  Genitourinary: Negative for difficulty urinating, dysuria and urgency.  Musculoskeletal: Positive for arthralgias, back pain and gait problem.  Skin: Negative for color change.  Neurological: Negative for speech difficulty, weakness, light-headedness and headaches.       Confused.   Psychiatric/Behavioral: Positive for behavioral problems and confusion. Negative for sleep disturbance. The patient is not nervous/anxious.     Immunization History  Administered Date(s) Administered   DTaP 07/20/2013   Influenza, High Dose Seasonal PF 12/07/2016   Influenza,inj,Quad PF,6+ Mos 12/01/2017   Influenza-Unspecified 11/20/2014, 12/12/2015, 11/20/2019   Moderna SARS-COVID-2 Vaccination 03/05/2019, 04/02/2019   PPD Test 08/06/2014   Pneumococcal Conjugate-13 11/07/2013   Pneumococcal Polysaccharide-23 03/22/2017   Zoster 03/01/2006   Zoster Recombinat (Shingrix)  06/07/2017, 08/26/2017   Pertinent  Health Maintenance Due  Topic Date Due   OPHTHALMOLOGY EXAM  Never done   HEMOGLOBIN A1C  06/12/2020   FOOT EXAM  07/10/2020   INFLUENZA VACCINE  Completed   DEXA SCAN  Completed   PNA vac Low Risk Adult  Completed   Fall Risk  12/19/2019 10/17/2019 08/22/2019 07/11/2019 06/13/2019  Falls in the past year? 0 0 0 0 0  Number falls in past yr: 0 0 0 0 0  Injury with Fall? - - 0 - -   Functional Status Survey:    Vitals:   02/05/20 0954  BP: 139/76  Pulse: 100  Resp: 18  Temp: 97.8 F (36.6 C)  SpO2: 97%  Weight: 141 lb 8 oz (64.2 kg)  Height: 5' (1.524 m)   Body mass index is 27.63 kg/m. Physical Exam Vitals and nursing note reviewed.  Constitutional:      Appearance: Normal appearance.     Comments: She seems tired but stayed awake during today's examination.   HENT:     Head: Normocephalic and atraumatic.  Mouth/Throat:  °   Mouth: Mucous membranes are moist.  °Eyes:  °   Extraocular Movements: Extraocular movements intact.  °   Conjunctiva/sclera: Conjunctivae normal.  °   Pupils: Pupils are equal, round, and reactive to light.  °Cardiovascular:  °   Rate and Rhythm: Regular rhythm. Tachycardia present.  °   Heart sounds: No murmur heard.  ° °Pulmonary:  °   Effort: Pulmonary effort is normal.  °   Breath sounds: No rales.  °Abdominal:  °   General: Bowel sounds are normal.  °   Palpations: Abdomen is soft.  °   Tenderness: There is no abdominal tenderness.  °   Comments: No apparent hemorrhoids, but excoriated anus highly suspected cause of bright blood seen yesterday. The patient is incontinent of BM. Has stools on both hands upon my examination. Her fingers are long-may scratched her anus?.  FOBT negative per nurse.   °Musculoskeletal:     °   General: Tenderness present.  °   Cervical back: Normal range of motion and neck supple.  °   Right lower leg: No edema.  °   Left lower leg: No edema.  °   Comments: Lower back   °Skin: °    General: Skin is warm and dry.  °Neurological:  °   General: No focal deficit present.  °   Mental Status: She is alert. Mental status is at baseline.  °   Motor: No weakness.  °   Coordination: Coordination normal.  °   Gait: Gait abnormal.  °   Comments: Oriented to person, followed simple directions.   °Psychiatric:     °   Mood and Affect: Mood normal.     °   Behavior: Behavior normal.  ° ° ° °Labs reviewed: °Recent Labs  °  12/13/19 °0000 12/13/19 °0000 12/26/19 °2137 12/26/19 °2137 12/27/19 °0410 12/27/19 °0410 01/10/20 °0000 01/21/20 °0000 01/28/20 °0000  °NA 141   < > 139   < > 136  --  142 141 143  °K 4.3   < > 3.6   < > 4.5   < > 3.3* 4.1 3.8  °CL 104   < > 101   < > 103   < > 106 106 108  °CO2 27   < >  --   --  22   < > 28* 27* 26*  °GLUCOSE 130*  --  169*  --  127*  --   --   --   --   °BUN 16   < > 16   < > 17  --  27* 12 11  °CREATININE 0.78  --  0.60   < > 0.74  --  0.6 0.7 0.5  °CALCIUM 9.0   < >  --   --  8.8*   < > 9.2 8.9 8.7  ° < > = values in this interval not displayed.  ° °Recent Labs  °  04/04/19 °0928 04/04/19 °0928 08/16/19 °0800 12/13/19 °0000 01/10/20 °0000  °AST 19   < > 14 15 18  °ALT 25   < > 14 11 17  °ALKPHOS  --   --   --   --  124  °BILITOT 0.5  --  0.4 0.4  --   °PROT 6.4  --  6.4 6.5  --   °ALBUMIN  --   --   --   --  3.5  ° < > = values in   this interval not displayed.  ° °Recent Labs  °  12/26/19 °2122 12/26/19 °2137 12/27/19 °0640 12/27/19 °0640 12/28/19 °0604 12/28/19 °0604 12/29/19 °0606 12/29/19 °0606 01/10/20 °0000 01/21/20 °0000 01/28/20 °0000  °WBC 15.4*   < > 14.1*   < > 12.1*   < > 10.0  --  14.3 9.2 7.2  °NEUTROABS 13.4*  --   --   --   --   --   --   --  10,082.00  --  4,673.00  °HGB 13.5   < > 13.2   < > 12.5   < > 12.2   < > 13.6 12.7 12.0  °HCT 41.7   < > 41.3   < > 39.9   < > 39.3   < > 42 40 37  °MCV 79.1*   < > 80.8  --  80.8  --  81.5  --   --   --   --   °PLT 297   < > 256   < > 245   < > 233   < > 523* 331 282  ° < > = values in this interval not displayed.   ° °Lab Results  °Component Value Date  ° TSH 3.509 12/29/2019  ° °Lab Results  °Component Value Date  ° HGBA1C 6.8 (H) 12/13/2019  ° °Lab Results  °Component Value Date  ° CHOL 109 12/13/2019  ° HDL 36 (L) 12/13/2019  ° LDLCALC 51 12/13/2019  ° TRIG 132 12/13/2019  ° CHOLHDL 3.0 12/13/2019  ° ° °Significant Diagnostic Results in last 30 days:  °No results found. ° °Assessment/Plan °Tachycardia °Not new, heart rate 100-110 bpm, asymptomatic, will obtain EKG. Last EKG 12/27/19 HR 90bpm. Will dc Seroquel to eliminated possible AR contributory to tachycardia. VS qshift x 72hrs for now.  ° °Depression with anxiety ° Depression/axniety,  Sertraline was increased 01/31/20. The patient usually irritable, little patience in am when she had to wait to get ready for the day, otherwise she sleeps and eats well, smiles, follows simple direction.  °            Dementia with mild delirium,  stopped Memantine per Dtr's request due to dizziness as possible side effect, started  Seroquel 01/31/20, too soon to eval its efficacy,  but HR has increased to 100-110 bpm. Will dc Seroquel for now, re-eval for need ° ° °Gait abnormality °Gait abnormality, working with therapy.  ° °Left tubo-ovarian mass ° Left ovarian cystic mass, had pelvic US, f/u Gyn ° °Mixed hyperlipidemia °Hyperlipidemia, takes Atorvastain ° °HTN (hypertension) °Blood pressure is controlled, continue Losartan ° °Diabetes mellitus type 2 in obese (HCC) °T2DM, Hgb a1c 6.8 12/13/19, takes Glimepiride 20mg qd.  ° ° °Hypothyroidism °Hypothyroidism, TSH 3.5 12/29/19, takes Levothyroxine ° ° °Burst fracture of lumbar vertebra, sequela °Hospitalized 12/26/19-12/30/19  For L4 burst fracture of lumbar vertebra identified on CT 12/27/19, non surgical management, lumbar brace when up and walking, , f/u neurosurgery,  Tramadol prn, Tylenol, Lidocaine patch for pain ° ° ° ° ° °Family/ staff Communication: plan of care reviewed with the patient and charge nurse.  ° °Labs/tests ordered:   EKG ° °Time spend 35 minutes.  °

## 2020-02-05 NOTE — Assessment & Plan Note (Signed)
Hyperlipidemia, takes Atorvastain

## 2020-02-05 NOTE — Assessment & Plan Note (Signed)
Gait abnormality, working with therapy.

## 2020-02-05 NOTE — Assessment & Plan Note (Signed)
Not new, heart rate 100-110 bpm, asymptomatic, will obtain EKG. Last EKG 12/27/19 HR 90bpm. Will dc Seroquel to eliminated possible AR contributory to tachycardia. VS qshift x 72hrs for now.

## 2020-02-05 NOTE — Assessment & Plan Note (Signed)
Depression/axniety,  Sertraline was increased 01/31/20. The patient usually irritable, little patience in am when she had to wait to get ready for the day, otherwise she sleeps and eats well, smiles, follows simple direction.  Dementia with mild delirium, stopped Memantine per Dtr's request due to dizziness as possible side effect, started  Seroquel 01/31/20, too soon to eval its efficacy,  but HR has increased to 100-110 bpm. Will dc Seroquel for now, re-eval for need

## 2020-02-05 NOTE — Assessment & Plan Note (Signed)
Hospitalized 12/26/19-12/30/19 For L4 burst fracture of lumbar vertebra identified on CT 12/27/19,non surgical management, lumbar brace when up and walking, , f/u neurosurgery,  Tramadol prn, Tylenol, Lidocaine patchfor pain

## 2020-02-05 NOTE — Assessment & Plan Note (Signed)
Left ovarian cystic mass, had pelvic US, f/u Gyn

## 2020-02-06 ENCOUNTER — Encounter: Payer: Self-pay | Admitting: Nurse Practitioner

## 2020-02-06 ENCOUNTER — Encounter: Payer: Self-pay | Admitting: Internal Medicine

## 2020-02-06 ENCOUNTER — Non-Acute Institutional Stay (SKILLED_NURSING_FACILITY): Payer: Medicare Other | Admitting: Internal Medicine

## 2020-02-06 DIAGNOSIS — K111 Hypertrophy of salivary gland: Secondary | ICD-10-CM | POA: Diagnosis not present

## 2020-02-06 DIAGNOSIS — F418 Other specified anxiety disorders: Secondary | ICD-10-CM | POA: Diagnosis not present

## 2020-02-06 DIAGNOSIS — E1169 Type 2 diabetes mellitus with other specified complication: Secondary | ICD-10-CM

## 2020-02-06 DIAGNOSIS — R000 Tachycardia, unspecified: Secondary | ICD-10-CM | POA: Diagnosis not present

## 2020-02-06 DIAGNOSIS — E782 Mixed hyperlipidemia: Secondary | ICD-10-CM

## 2020-02-06 DIAGNOSIS — S32001S Stable burst fracture of unspecified lumbar vertebra, sequela: Secondary | ICD-10-CM

## 2020-02-06 DIAGNOSIS — E669 Obesity, unspecified: Secondary | ICD-10-CM

## 2020-02-06 DIAGNOSIS — F0391 Unspecified dementia with behavioral disturbance: Secondary | ICD-10-CM

## 2020-02-06 NOTE — Progress Notes (Unsigned)
Location:    [facility] Room Number: 39 Place of Service:  SNF [phone]) Provider:  Veleta Miners MD  [name], MD  Patient Care Team: [name], MD as PCP - General (Internal Medicine)  Extended Emergency Contact Information Primary Emergency Contact: Harris,Margaret Address: [address]          [place], [place] [address] [name] of [place] Phone: [phone] Mobile Phone: [phone] Relation: Daughter  Code Status:  DNR Goals of care: Advanced Directive information Advanced Directives 02/04/2020  Does Patient Have a Medical Advance Directive? Yes  Type of Advance Directive Out of facility DNR (pink MOST or yellow form);Living will;Healthcare Power of Attorney  Does patient want to make changes to medical advance directive? No - Patient declined  Copy of [place] in Chart? Yes - validated most recent copy scanned in chart (See row information)  Would patient like information on creating a medical advance directive? -  Pre-existing out of facility DNR order (yellow form or pink MOST form) Yellow form placed in chart (order not valid for inpatient use)     Chief Complaint  Patient presents with  . Acute Visit    Swelling of the face    HPI:  Pt is a 86 y.o. female seen today for an acute visit for Swelling in front of her Left Ear in Parotid Area Also Continue to have issues with Behavior especially Sundowning.   Patient was admitted in the hospital from 10/27-10/31 with L4 burst fracture after the fall Patient has a history of type 2 diabetes, hyperlipidemia, depression, dementia, hypothyroidism, osteopenia, stress and urge urinary incontinence [name] here in SNF for therapy  Swelling in front of her left ear and parotid area noticed nurses family [name] asymptomatic no pain.  No problem falling. No fever or chills Tachycardia Heart rate 90 -105 EKG done today shows sinus rythm Depression with  behavior issues Continues to be the  problem including agitation and Screaming Playing with Dirty Diaper Depression with Weight loss L4 burst fracture Pain seems controlled.  Daughter wants her to see neurosurgery [name] doing better with therapy but still needing assist Past Medical History:  Diagnosis Date  . Hearing loss   . History of fracture of clavicle   . Hypothyroidism   . Insomnia   . Tinnitus    Past Surgical History:  Procedure Laterality Date  . CHOLECYSTECTOMY  2007   Dr. Verdene Lennert  . CLAVICLE EXCISION  1986  . MOHS SURGERY     past 10 years chest & legs    Allergies  Allergen Reactions  . Metformin And Related Diarrhea    Allergies as of 02/06/2020      Reactions   Metformin And Related Diarrhea      Medication List       Accurate as of February 06, 2020 10:54 AM. If you have any questions, ask your nurse or doctor.        STOP taking these medications   QUEtiapine 25 MG tablet Commonly known as: SEROQUEL Stopped by: [name], MD     TAKE these medications   acetaminophen 325 MG tablet Commonly known as: TYLENOL Take 650 mg by mouth in the morning, at noon, and at bedtime.   atorvastatin 10 MG tablet Commonly known as: LIPITOR Take 10 mg by mouth daily.   Blood Glucose Monitoring Suppl Kit Use to test blood sugar once daily daily. Dx: E11.69  Boost Glucose Control Liqd Take by mouth. 1 can three times a day between meals   glimepiride 2 MG tablet Commonly known as: AMARYL Take 2 mg by mouth daily with breakfast.   glucose blood test strip Use to test blood sugar once daily. Dx: E11.69   Lancets 30G Misc Use to test blood sugar once daily. Dx: E11.69   Lancets Misc. Kit 1 Device by Does not apply route daily. Use as directed   levothyroxine 75 MCG tablet Commonly known as: SYNTHROID TAKE 1 TABLET (75 MCG TOTAL) BY MOUTH DAILY BEFORE BREAKFAST.   Lidocaine 4 % Ptch Apply topically every morning.   losartan 25 MG  tablet Commonly known as: COZAAR Take 25 mg by mouth in the morning and at bedtime.   sertraline 25 MG tablet Commonly known as: ZOLOFT Take 50 mg by mouth daily.   traMADol 50 MG tablet Commonly known as: ULTRAM Take 25 mg by mouth every 6 (six) hours as needed.   Vitamin D3 50 MCG (2000 UT) Tabs Take 1 tablet by mouth daily.   zinc oxide 20 % ointment Apply 1 application topically as needed for irritation.       Review of Systems  Constitutional: Positive for activity change, appetite change and unexpected weight change.  HENT: Positive for facial swelling.   Respiratory: Negative.   Cardiovascular: Negative.   Gastrointestinal: Negative.   Genitourinary: Negative.   Musculoskeletal: Positive for back pain and gait problem.  Skin: Negative.   Neurological: Positive for weakness.  Psychiatric/Behavioral: Positive for behavioral problems, confusion, dysphoric mood and sleep disturbance. The patient is nervous/anxious.     Immunization History  Administered Date(s) Administered  . DTaP 07/20/2013  . Influenza, High Dose Seasonal PF 12/07/2016  . Influenza,inj,Quad PF,6+ Mos 12/01/2017  . Influenza-Unspecified 11/20/2014, 12/12/2015, 11/20/2019  . Moderna SARS-COVID-2 Vaccination 03/05/2019, 04/02/2019  . PPD Test 08/06/2014  . Pneumococcal Conjugate-13 11/07/2013  . Pneumococcal Polysaccharide-23 03/22/2017  . Zoster 03/01/2006  . Zoster Recombinat (Shingrix) 06/07/2017, 08/26/2017   Pertinent  Health Maintenance Due  Topic Date Due  . OPHTHALMOLOGY EXAM  Never done  . HEMOGLOBIN A1C  06/12/2020  . FOOT EXAM  07/10/2020  . INFLUENZA VACCINE  Completed  . DEXA SCAN  Completed  . PNA vac Low Risk Adult  Completed   Fall Risk  12/19/2019 10/17/2019 08/22/2019 07/11/2019 06/13/2019  Falls in the past year? 0 0 0 0 0  Number falls in past yr: 0 0 0 0 0  Injury with Fall? - - 0 - -   Functional Status Survey:    Vitals:   02/06/20 1048  BP: (!) 148/73  Pulse:  98  Resp: 14  Temp: (!) 97.2 F (36.2 C)  SpO2: 98%  Weight: 141 lb 8 oz (64.2 kg)  Height: 5' (1.524 m)   Body mass index is 27.63 kg/m. Physical Exam Vitals reviewed.  Constitutional:      Appearance: Normal appearance.  HENT:     Head: Normocephalic.     Comments: Has hard swelling in front of left Ear which is not red but is tender with pressure No Lymphnodes    Nose: Nose normal.     Mouth/Throat:     Mouth: Mucous membranes are moist.     Pharynx: Oropharynx is clear.     Comments: No Signs of any Pus point or Tooth Abscess   Eyes:     Pupils: Pupils are equal, round, and reactive to light.  Cardiovascular:  Rate and Rhythm: Normal rate and regular rhythm.     Pulses: Normal pulses.     Heart sounds: Normal heart sounds.  Pulmonary:     Effort: Pulmonary effort is normal. No respiratory distress.     Breath sounds: Normal breath sounds. No wheezing.  Abdominal:     General: Abdomen is flat. Bowel sounds are normal.     Palpations: Abdomen is soft.  Musculoskeletal:        General: No swelling.     Cervical back: Neck supple.  Skin:    General: Skin is warm.  Neurological:     General: No focal deficit present.     Mental Status: She is alert.  Psychiatric:        Mood and Affect: Mood normal.     Comments: Depressed Mood. Easily gets Agitated     Labs reviewed: Recent Labs    12/13/19 0000 12/13/19 0000 12/26/19 2137 12/26/19 2137 12/27/19 0410 12/27/19 0410 01/10/20 0000 01/21/20 0000 01/28/20 0000  NA 141   < > 139   < > 136  --  142 141 143  K 4.3   < > 3.6   < > 4.5   < > 3.3* 4.1 3.8  CL 104   < > 101   < > 103   < > 106 106 108  CO2 27   < >  --   --  22   < > 28* 27* 26*  GLUCOSE 130*  --  169*  --  127*  --   --   --   --   BUN 16   < > 16   < > 17  --  27* 12 11  CREATININE 0.78  --  0.60   < > 0.74  --  0.6 0.7 0.5  CALCIUM 9.0   < >  --   --  8.8*   < > 9.2 8.9 8.7   < > = values in this interval not displayed.   Recent Labs     04/04/19 0928 04/04/19 0928 08/16/19 0800 12/13/19 0000 01/10/20 0000  AST 19   < > '14 15 18  ' ALT 25   < > '14 11 17  ' ALKPHOS  --   --   --   --  124  BILITOT 0.5  --  0.4 0.4  --   PROT 6.4  --  6.4 6.5  --   ALBUMIN  --   --   --   --  3.5   < > = values in this interval not displayed.   Recent Labs    12/26/19 2122 12/26/19 2137 12/27/19 0640 12/27/19 0640 12/28/19 0604 12/28/19 0604 12/29/19 0606 12/29/19 0606 01/10/20 0000 01/21/20 0000 01/28/20 0000  WBC 15.4*   < > 14.1*   < > 12.1*   < > 10.0  --  14.3 9.2 7.2  NEUTROABS 13.4*  --   --   --   --   --   --   --  10,082.00  --  4,673.00  HGB 13.5   < > 13.2   < > 12.5   < > 12.2   < > 13.6 12.7 12.0  HCT 41.7   < > 41.3   < > 39.9   < > 39.3   < > 42 40 37  MCV 79.1*   < > 80.8  --  80.8  --  81.5  --   --   --   --  PLT 297   < > 256   < > 245   < > 233   < > 523* 331 282   < > = values in this interval not displayed.   Lab Results  Component Value Date   TSH 3.509 12/29/2019   Lab Results  Component Value Date   HGBA1C 6.8 (H) 12/13/2019   Lab Results  Component Value Date   CHOL 109 12/13/2019   HDL 36 (L) 12/13/2019   LDLCALC 51 12/13/2019   TRIG 132 12/13/2019   CHOLHDL 3.0 12/13/2019    Significant Diagnostic Results in last 30 days:  No results found.  Assessment/Plan Parotid gland enlargement [place] of Parotid area Start on Aumentin 500 mg tid for 7 days Reassess ? CT scan eventually CBC with diff  Tachycardia EKG sinus Will continue to monitor  Burst fracture of lumbar vertebra, sequela Pain is Controlled  Making progress with therapy but Cognition seems to be limiting her Follow up with Neurosurgery   Dementia with Behaviors issues Seroquel Discontinued at daughters request She is having behavior issues  Has failed Namenda ACP D/W daughter [name] DNR  Other issues  Mixed hyperlipidemia On Statin Diabetes mellitus type 2 in obese ([place]) AMaryl Weight loss D/W  dietary for supplements Hold Remeron for now as she is already sleeping alot Depression with anxiety Increase Zoloft to 50 mg Hypertension Controlled on Cozaar  Family/ staff Communication:   Labs/tests ordered:  CBC

## 2020-02-07 ENCOUNTER — Other Ambulatory Visit: Payer: Self-pay | Admitting: Internal Medicine

## 2020-02-07 DIAGNOSIS — E038 Other specified hypothyroidism: Secondary | ICD-10-CM

## 2020-02-07 LAB — CBC AND DIFFERENTIAL
HCT: 35 — AB (ref 36–46)
Hemoglobin: 11 — AB (ref 12.0–16.0)
Platelets: 333 (ref 150–399)
WBC: 7.1

## 2020-02-07 LAB — CBC: RBC: 4.42 (ref 3.87–5.11)

## 2020-02-14 ENCOUNTER — Non-Acute Institutional Stay (SKILLED_NURSING_FACILITY): Payer: Medicare Other | Admitting: Internal Medicine

## 2020-02-14 ENCOUNTER — Encounter: Payer: Self-pay | Admitting: Internal Medicine

## 2020-02-14 DIAGNOSIS — E782 Mixed hyperlipidemia: Secondary | ICD-10-CM

## 2020-02-14 DIAGNOSIS — F0391 Unspecified dementia with behavioral disturbance: Secondary | ICD-10-CM

## 2020-02-14 DIAGNOSIS — R000 Tachycardia, unspecified: Secondary | ICD-10-CM | POA: Diagnosis not present

## 2020-02-14 DIAGNOSIS — F418 Other specified anxiety disorders: Secondary | ICD-10-CM

## 2020-02-14 DIAGNOSIS — K111 Hypertrophy of salivary gland: Secondary | ICD-10-CM

## 2020-02-14 DIAGNOSIS — E1169 Type 2 diabetes mellitus with other specified complication: Secondary | ICD-10-CM

## 2020-02-14 DIAGNOSIS — E669 Obesity, unspecified: Secondary | ICD-10-CM

## 2020-02-14 DIAGNOSIS — S32001S Stable burst fracture of unspecified lumbar vertebra, sequela: Secondary | ICD-10-CM

## 2020-02-14 DIAGNOSIS — S32040A Wedge compression fracture of fourth lumbar vertebra, initial encounter for closed fracture: Secondary | ICD-10-CM | POA: Diagnosis not present

## 2020-02-14 LAB — BASIC METABOLIC PANEL
BUN: 12 (ref 4–21)
CO2: 26 — AB (ref 13–22)
Chloride: 108 (ref 99–108)
Creatinine: 0.6 (ref 0.5–1.1)
Glucose: 106
Potassium: 3.6 (ref 3.4–5.3)
Sodium: 143 (ref 137–147)

## 2020-02-14 LAB — COMPREHENSIVE METABOLIC PANEL: Calcium: 8.7 (ref 8.7–10.7)

## 2020-02-14 NOTE — Progress Notes (Signed)
Location: [facility] Room Number: 53 Place of Service:  SNF (31)  Provider:   Code Status:  Goals of Care:  Advanced Directives 02/14/2020  Does Patient Have a Medical Advance Directive? Yes  Type of Paramedic of [place];Living will;Out of facility DNR (pink MOST or yellow form)  Does patient want to make changes to medical advance directive? No - Patient declined  Copy of [place] in Chart? Yes - validated most recent copy scanned in chart (See row information)  Would patient like information on creating a medical advance directive? -  Pre-existing out of facility DNR order (yellow form or pink MOST form) -     Chief Complaint  Patient presents with  . Follow-up    HPI: [name] a 86 y.o. female seen today for an acute visit for Follow up requested by her Daughter  Patient was admitted in the hospital from 10/27-10/31 with L4 burst fracture after the fall Patient has a history of type 2 diabetes, hyperlipidemia, depression, dementia, hypothyroidism, osteopenia, stress and urge urinary incontinence [name] here in SNF for therapy  Parotid Gland Swelling US done in Facility did not show any abscess. Started on Augmentin for week. Today is her last day. Swelling has mostly gone. Small area is left which is not tender anymore  S/P Lumbar fracture Pain control with Tylenol Saw Neurosurgery today Now has better Brace Working with therapy including walking with walker  Still need assist with her ADLS Cognitive impairment Continues to have some behavior issues Seroquel was discontinued due to family request Weight loss Appetite slightly improved Tachycardia Heart rate 90 -105 EKG done today shows sinus rhythm Past Medical History:  Diagnosis Date  . Hearing loss   . History of fracture of clavicle   . Hypothyroidism   . Insomnia   . Tinnitus     Past Surgical History:  Procedure Laterality  Date  . CHOLECYSTECTOMY  2007   Dr. Verdene Lennert  . CLAVICLE EXCISION  1986  . MOHS SURGERY     past 10 years chest & legs    Allergies  Allergen Reactions  . Metformin And Related Diarrhea    Outpatient Encounter Medications as of 02/14/2020  Medication Sig  . acetaminophen (TYLENOL) 325 MG tablet Take 650 mg by mouth in the morning, at noon, and at bedtime.  Marland Kitchen atorvastatin (LIPITOR) 10 MG tablet Take 10 mg by mouth daily.  . Blood Glucose Monitoring Suppl KIT Use to test blood sugar once daily daily. Dx: E11.69  . Cholecalciferol (VITAMIN D3) 2000 units TABS Take 1 tablet by mouth daily.   Marland Kitchen glimepiride (AMARYL) 2 MG tablet Take 2 mg by mouth daily with breakfast.  . glucose blood test strip Use to test blood sugar once daily. Dx: E11.69  . Lancets 30G MISC Use to test blood sugar once daily. Dx: E11.69  . Lancets Misc. KIT 1 Device by Does not apply route daily. Use as directed  . levothyroxine (SYNTHROID) 75 MCG tablet TAKE 1 TABLET (75 MCG TOTAL) BY MOUTH DAILY BEFORE BREAKFAST.  Marland Kitchen Lidocaine 4 % PTCH Apply topically every morning.  Marland Kitchen losartan (COZAAR) 25 MG tablet Take 25 mg by mouth in the morning and at bedtime.   . Nutritional Supplements (BOOST GLUCOSE CONTROL) LIQD Take by mouth. 1 can three times a day between meals  . sertraline (ZOLOFT) 25 MG tablet Take 50 mg by mouth daily.   . traMADol (ULTRAM) 50 MG tablet  Take 25 mg by mouth every 6 (six) hours as needed.   . zinc oxide 20 % ointment Apply 1 application topically as needed for irritation.   No facility-administered encounter medications on file as of 02/14/2020.    Review of Systems:  Review of Systems  Constitutional: Positive for activity change.  HENT: Negative.   Respiratory: Negative.   Cardiovascular: Negative.   Gastrointestinal: Negative.   Genitourinary: Negative.   Musculoskeletal: Positive for gait problem.  Skin: Negative.   Neurological: Positive for weakness.  Psychiatric/Behavioral: Positive for  confusion.    Health Maintenance  Topic Date Due  . OPHTHALMOLOGY EXAM  Never done  . COVID-19 Vaccine (3 - Booster for Moderna series) 09/30/2019  . TETANUS/TDAP  11/24/2026 (Originally 08/19/1947)  . HEMOGLOBIN A1C  06/12/2020  . FOOT EXAM  07/10/2020  . INFLUENZA VACCINE  Completed  . DEXA SCAN  Completed  . PNA vac Low Risk Adult  Completed    Physical Exam: Vitals:   02/14/20 1643  BP: 136/76  Pulse: (!) 106  Resp: 16  Temp: (!) 97.4 F (36.3 C)  SpO2: 96%  Weight: 144 lb 12.8 oz (65.7 kg)  Height: 5' (1.524 m)   Body mass index is 28.28 kg/m. Physical Exam Vitals reviewed.  Constitutional:      Appearance: Normal appearance.  HENT:     Head: Normocephalic.     Nose: Nose normal.     Mouth/Throat:     Mouth: Mucous membranes are moist.     Pharynx: Oropharynx is clear.     Comments: Small Swelling in Front of Left Ear. Not tender Eyes:     Pupils: Pupils are equal, round, and reactive to light.  Cardiovascular:     Rate and Rhythm: Normal rate.     Pulses: Normal pulses.  Pulmonary:     Effort: Pulmonary effort is normal.     Breath sounds: Normal breath sounds.  Abdominal:     General: Abdomen is flat. Bowel sounds are normal.     Palpations: Abdomen is soft.  Musculoskeletal:        General: No swelling.     Cervical back: Neck supple.  Skin:    General: Skin is warm and dry.  Neurological:     General: No focal deficit present.     Mental Status: She is alert.  Psychiatric:        Mood and Affect: Mood normal.        Thought Content: Thought content normal.     Labs reviewed: Basic Metabolic Panel: Recent Labs    04/04/19 0928 08/16/19 0800 12/13/19 0000 12/13/19 0000 12/26/19 2137 12/27/19 0410 12/29/19 0606 01/10/20 0000 01/21/20 0000 01/28/20 0000  NA 140   < > 141  --  139 136  --  142 141 143  K 3.9   < > 4.3  --  3.6 4.5  --  3.3* 4.1 3.8  CL 105   < > 104  --  101 103  --  106 106 108  CO2 27   < > 27  --   --  22  --   28* 27* 26*  GLUCOSE 234*   < > 130*  --  169* 127*  --   --   --   --   BUN 16   < > 16  --  16 17  --  27* 12 11  CREATININE 0.79   < > 0.78   < > 0.60 0.74  --  0.6 0.7 0.5  CALCIUM 8.7   < > 9.0  --   --  8.8*  --  9.2 8.9 8.7  TSH 3.06  --   --   --   --   --  3.509  --   --   --    < > = values in this interval not displayed.   Liver Function Tests: Recent Labs    04/04/19 0928 08/16/19 0800 12/13/19 0000 01/10/20 0000  AST '19 14 15 18  ' ALT '25 14 11 17  ' ALKPHOS  --   --   --  124  BILITOT 0.5 0.4 0.4  --   PROT 6.4 6.4 6.5  --   ALBUMIN  --   --   --  3.5   No results for input(s): LIPASE, AMYLASE in the last 8760 hours. No results for input(s): AMMONIA in the last 8760 hours. CBC: Recent Labs    12/26/19 2122 12/26/19 2137 12/27/19 0640 12/28/19 0604 12/29/19 0606 12/29/19 0606 01/10/20 0000 01/21/20 0000 01/28/20 0000 02/07/20 0000  WBC 15.4*  --  14.1* 12.1* 10.0   < > 14.3 9.2 7.2 7.1  NEUTROABS 13.4*  --   --   --   --   --  10,082.00  --  4,673.00  --   HGB 13.5   < > 13.2 12.5 12.2  --  13.6 12.7 12.0 11.0*  HCT 41.7   < > 41.3 39.9 39.3  --  42 40 37 35*  MCV 79.1*  --  80.8 80.8 81.5  --   --   --   --   --   PLT 297  --  256 245 233  --  523* 331 282 333   < > = values in this interval not displayed.   Lipid Panel: Recent Labs    04/04/19 0928 12/13/19 0000  CHOL 108 109  HDL 30* 36*  LDLCALC 56 51  TRIG 138 132  CHOLHDL 3.6 3.0   Lab Results  Component Value Date   HGBA1C 6.8 (H) 12/13/2019    Procedures since last visit: No results found.  Assessment/Plan Parotid gland enlargement Much Improved on Augmentin [place] was negative for any abscess Will consider CT scan if any worsening Tachycardia ? Will consider Beta blocker if she continues with Tachycardia EKG sinus Burst fracture of lumbar vertebra, sequela Pain Controlled Saw Neurosurgery today Has smaller brace now Walking with Mild assist  Hypertension Controlled on  Cozaar Depression with anxiety Doing better on Zoloft Seroquel Discontinued at daughters request Mixed hyperlipidemia On statin Diabetes mellitus type 2 in obese ([place]) AMaryl Dementia with behavioral disturbance, unspecified dementia type ([place]) Some better with her behaviors Has failed Namenda Weight loss Is working with [place] for now as she is already sleeping alot Discussed with the daughter Labs/tests ordered:  * No order type specified * Next appt:  04/17/2020

## 2020-02-15 ENCOUNTER — Encounter: Payer: Self-pay | Admitting: Nurse Practitioner

## 2020-02-15 ENCOUNTER — Non-Acute Institutional Stay (SKILLED_NURSING_FACILITY): Payer: Medicare Other | Admitting: Nurse Practitioner

## 2020-02-15 DIAGNOSIS — E038 Other specified hypothyroidism: Secondary | ICD-10-CM

## 2020-02-15 DIAGNOSIS — E669 Obesity, unspecified: Secondary | ICD-10-CM

## 2020-02-15 DIAGNOSIS — F418 Other specified anxiety disorders: Secondary | ICD-10-CM

## 2020-02-15 DIAGNOSIS — S32001S Stable burst fracture of unspecified lumbar vertebra, sequela: Secondary | ICD-10-CM | POA: Diagnosis not present

## 2020-02-15 DIAGNOSIS — R000 Tachycardia, unspecified: Secondary | ICD-10-CM | POA: Diagnosis not present

## 2020-02-15 DIAGNOSIS — R269 Unspecified abnormalities of gait and mobility: Secondary | ICD-10-CM

## 2020-02-15 DIAGNOSIS — F0391 Unspecified dementia with behavioral disturbance: Secondary | ICD-10-CM

## 2020-02-15 DIAGNOSIS — E782 Mixed hyperlipidemia: Secondary | ICD-10-CM

## 2020-02-15 DIAGNOSIS — E1169 Type 2 diabetes mellitus with other specified complication: Secondary | ICD-10-CM

## 2020-02-15 DIAGNOSIS — F05 Delirium due to known physiological condition: Secondary | ICD-10-CM

## 2020-02-15 DIAGNOSIS — N838 Other noninflammatory disorders of ovary, fallopian tube and broad ligament: Secondary | ICD-10-CM

## 2020-02-15 DIAGNOSIS — I10 Essential (primary) hypertension: Secondary | ICD-10-CM

## 2020-02-15 DIAGNOSIS — F03918 Unspecified dementia, unspecified severity, with other behavioral disturbance: Secondary | ICD-10-CM

## 2020-02-15 NOTE — Assessment & Plan Note (Signed)
Dementia with mild delirium, stopped Memantine per Dtr's request due to dizziness as possible side effect,off Seroquel

## 2020-02-15 NOTE — Assessment & Plan Note (Signed)
Gait abnormality, working with therapy.

## 2020-02-15 NOTE — Progress Notes (Addendum)
Location:   SNF [facility] Room Number: 63 Place of Service:  SNF (31) Provider: [facility] Aleane Wesenberg NP  [name], MD  Patient Care Team: [name], MD as PCP - General (Internal Medicine)  Extended Emergency Contact Information Primary Emergency Contact: Harris,Margaret Address: [address]          [place], [place] [address] [name] of [place] Phone: [phone] Mobile Phone: [phone] Relation: Daughter  Code Status:  DNR Goals of care: Advanced Directive information Advanced Directives 02/14/2020  Does Patient Have a Medical Advance Directive? Yes  Type of Paramedic of [place];Living will;Out of facility DNR (pink MOST or yellow form)  Does patient want to make changes to medical advance directive? No - Patient declined  Copy of [place] in Chart? Yes - validated most recent copy scanned in chart (See row information)  Would patient like information on creating a medical advance directive? -  Pre-existing out of facility DNR order (yellow form or pink MOST form) -     Chief Complaint  Patient presents with  . Acute Visit    Anxiety, yelling, screaming    HPI:  Pt is a 86 y.o. female seen today for an acute visit for yelling, screaming, anxious episodes, off Seroquel per HPOA's request.   Depression/axniety, takes Sertraline. The patient usually irritable, little patience in am when she had to wait to get ready for the day.   HR in 100s, denied SOB, chest pain/pressure,palpitation, TSH 3.5 12/29/19  Lumbar vertebra identified on CT 12/27/19,non surgical management, lumbar brace when up and walking, , f/u neurosurgery,Tramadol prn, Tylenol, Lidocaine patchfor pain Hypothyroidism, TSH 3.5 12/29/19, takes Levothyroxine T2DM, Hgb a1c 6.8 12/13/19, takes Glimepiride 51m qd. HTN, takes Losartan.Bun/creat 12/0.57 02/14/20 Hyperlipidemia, takes  Atorvastatin LDL 77 2020 Dementia with mild delirium, stopped Memantine per Dtr's request due to dizziness as possible side effect,off Seroquel  Gait abnormality, working with therapy.  Left ovarian cystic mass, had pelvic [place] f/u Gyn  Past Medical History:  Diagnosis Date  . Hearing loss   . History of fracture of clavicle   . Hypothyroidism   . Insomnia   . Tinnitus    Past Surgical History:  Procedure Laterality Date  . CHOLECYSTECTOMY  2007   Dr. HVerdene Lennert . CLAVICLE EXCISION  1986  . MOHS SURGERY     past 10 years chest & legs    Allergies  Allergen Reactions  . Metformin And Related Diarrhea    Allergies as of 02/15/2020      Reactions   Metformin And Related Diarrhea      Medication List       Accurate as of February 15, 2020 11:59 PM. If you have any questions, ask your nurse or doctor.        acetaminophen 325 MG tablet Commonly known as: TYLENOL Take 650 mg by mouth in the morning, at noon, and at bedtime.   atorvastatin 10 MG tablet Commonly known as: LIPITOR Take 10 mg by mouth daily.   Blood Glucose Monitoring Suppl Kit Use to test blood sugar once daily daily. Dx: E11.69   Boost Glucose Control Liqd Take by mouth. 1 can three times a day between meals   glimepiride 2 MG tablet Commonly known as: AMARYL Take 2 mg by mouth daily with breakfast.   glucose blood test strip Use to test blood sugar once daily. Dx: E11.69   Lancets 30G Misc Use to test blood sugar once  daily. Dx: E11.69   Lancets Misc. Kit 1 Device by Does not apply route daily. Use as directed   levothyroxine 75 MCG tablet Commonly known as: SYNTHROID TAKE 1 TABLET (75 MCG TOTAL) BY MOUTH DAILY BEFORE BREAKFAST.   Lidocaine 4 % Ptch Apply topically every morning.   losartan 25 MG tablet Commonly known as: COZAAR Take 25 mg by mouth in the morning and at bedtime.   sertraline 25 MG tablet Commonly known as: ZOLOFT Take 50  mg by mouth daily.   traMADol 50 MG tablet Commonly known as: ULTRAM Take 25 mg by mouth every 6 (six) hours as needed.   Vitamin D3 50 MCG (2000 UT) Tabs Take 1 tablet by mouth daily.   zinc oxide 20 % ointment Apply 1 application topically as needed for irritation.       Review of Systems  Constitutional: Negative for fatigue and fever.       Weight loss about #3Ibs in the past month  HENT: Positive for hearing loss. Negative for congestion and voice change.   Eyes: Negative for visual disturbance.  Respiratory: Negative for cough, shortness of breath and wheezing.   Cardiovascular: Negative for chest pain, palpitations and leg swelling.  Gastrointestinal: Negative for abdominal pain and constipation.  Genitourinary: Negative for difficulty urinating, dysuria and urgency.  Musculoskeletal: Positive for arthralgias, back pain and gait problem.  Skin: Negative for color change.  Neurological: Negative for speech difficulty, weakness, light-headedness and headaches.       Confused.   Psychiatric/Behavioral: Positive for behavioral problems and confusion. Negative for sleep disturbance. The patient is not nervous/anxious.     Immunization History  Administered Date(s) Administered  . DTaP 07/20/2013  . Influenza, High Dose Seasonal PF 12/07/2016  . Influenza,inj,Quad PF,6+ Mos 12/01/2017  . Influenza-Unspecified 11/20/2014, 12/12/2015, 11/20/2019  . Moderna Sars-Covid-2 Vaccination 03/05/2019, 04/02/2019  . PPD Test 08/06/2014  . Pneumococcal Conjugate-13 11/07/2013  . Pneumococcal Polysaccharide-23 03/22/2017  . Zoster 03/01/2006  . Zoster Recombinat (Shingrix) 06/07/2017, 08/26/2017   Pertinent  Health Maintenance Due  Topic Date Due  . OPHTHALMOLOGY EXAM  Never done  . HEMOGLOBIN A1C  06/12/2020  . FOOT EXAM  07/10/2020  . INFLUENZA VACCINE  Completed  . DEXA SCAN  Completed  . PNA vac Low Risk Adult  Completed   Fall Risk  12/19/2019 10/17/2019 08/22/2019  07/11/2019 06/13/2019  Falls in the past year? 0 0 0 0 0  Number falls in past yr: 0 0 0 0 0  Injury with Fall? - - 0 - -   Functional Status Survey:    Vitals:   02/15/20 1010  BP: 136/76  Pulse: (!) 106  Resp: 16  Temp: 97.6 F (36.4 C)  SpO2: 96%   There is no height or weight on file to calculate BMI. Physical Exam Vitals and nursing note reviewed.  Constitutional:      Appearance: Normal appearance.  HENT:     Head: Normocephalic and atraumatic.     Mouth/Throat:     Mouth: Mucous membranes are moist.  Eyes:     Extraocular Movements: Extraocular movements intact.     Conjunctiva/sclera: Conjunctivae normal.     Pupils: Pupils are equal, round, and reactive to light.  Cardiovascular:     Rate and Rhythm: Regular rhythm. Tachycardia present.     Heart sounds: No murmur heard.   Pulmonary:     Effort: Pulmonary effort is normal.     Breath sounds: No rales.  Abdominal:  General: Bowel sounds are normal.     Palpations: Abdomen is soft.     Tenderness: There is no abdominal tenderness.     Comments: No apparent hemorrhoids, but excoriated anus highly suspected cause of bright blood seen yesterday. The patient is incontinent of BM. Has stools on both hands upon my examination. Her fingers are long-may scratched her anus?.  FOBT negative per nurse.   Musculoskeletal:        General: Tenderness present.     Cervical back: Normal range of motion and neck supple.     Right lower leg: No edema.     Left lower leg: No edema.     Comments: Lower back   Skin:    General: Skin is warm and dry.     Comments: Left parotid mass is resolved.   Neurological:     General: No focal deficit present.     Mental Status: She is alert. Mental status is at baseline.     Motor: No weakness.     Coordination: Coordination normal.     Gait: Gait abnormal.     Comments: Oriented to person, followed simple directions.   Psychiatric:        Mood and Affect: Mood normal.         Behavior: Behavior normal.     Labs reviewed: Recent Labs    12/13/19 0000 12/13/19 0000 12/26/19 2137 12/27/19 0410 01/10/20 0000 01/21/20 0000 01/28/20 0000  NA 141  --  139 136 142 141 143  K 4.3  --  3.6 4.5 3.3* 4.1 3.8  CL 104  --  101 103 106 106 108  CO2 27  --   --  22 28* 27* 26*  GLUCOSE 130*  --  169* 127*  --   --   --   BUN 16  --  16 17 27* 12 11  CREATININE 0.78   < > 0.60 0.74 0.6 0.7 0.5  CALCIUM 9.0  --   --  8.8* 9.2 8.9 8.7   < > = values in this interval not displayed.   Recent Labs    04/04/19 0928 08/16/19 0800 12/13/19 0000 01/10/20 0000  AST '19 14 15 18  ' ALT '25 14 11 17  ' ALKPHOS  --   --   --  124  BILITOT 0.5 0.4 0.4  --   PROT 6.4 6.4 6.5  --   ALBUMIN  --   --   --  3.5   Recent Labs    12/26/19 2122 12/26/19 2137 12/27/19 0640 12/28/19 0604 12/29/19 0606 12/29/19 0606 01/10/20 0000 01/21/20 0000 01/28/20 0000 02/07/20 0000  WBC 15.4*  --  14.1* 12.1* 10.0   < > 14.3 9.2 7.2 7.1  NEUTROABS 13.4*  --   --   --   --   --  10,082.00  --  4,673.00  --   HGB 13.5   < > 13.2 12.5 12.2  --  13.6 12.7 12.0 11.0*  HCT 41.7   < > 41.3 39.9 39.3  --  42 40 37 35*  MCV 79.1*  --  80.8 80.8 81.5  --   --   --   --   --   PLT 297  --  256 245 233  --  523* 331 282 333   < > = values in this interval not displayed.   Lab Results  Component Value Date   TSH 3.509 12/29/2019   Lab Results  Component Value Date   HGBA1C 6.8 (H) 12/13/2019   Lab Results  Component Value Date   CHOL 109 12/13/2019   HDL 36 (L) 12/13/2019   LDLCALC 51 12/13/2019   TRIG 132 12/13/2019   CHOLHDL 3.0 12/13/2019    Significant Diagnostic Results in last 30 days:  No results found.  Assessment/Plan Depression with anxiety  yelling, screaming, anxious episodes, off Seroquel per HPOA's request.   Depression/axniety, takes Sertraline. The patient usually irritable, little patience in am when she had to wait to get ready for the day.   Will increase  Sertraline 51m qd, may consider Depakote to stabilize mood if needed.   02/19/20 HPOA declined increasing Sertraline to 724mqd.    Tachycardia Persisted HR in 100s regardless off Seroquel, denied SOB, chest pain/pressure,palpitation, TSH 3.5 12/29/19, will add Metoprolol 12.74m68mid. Observe.   Burst fracture of lumbar vertebra, sequela Lumbar vertebra identified on CT 12/27/19,non surgical management, lumbar brace when up and walking, , f/u neurosurgery,Tramadol prn, Tylenol, Lidocaine patchfor pain   Hypothyroidism Hypothyroidism, TSH 3.5 12/29/19, takes Levothyroxine   Diabetes mellitus type 2 in obese (HCC) T2DM, Hgb a1c 6.8 12/13/19, takes Glimepiride 67m39m.   HTN (hypertension) HTN, takes Losartan.Bun/creat 12/0.57 02/14/20    Mixed hyperlipidemia Hyperlipidemia, takes Atorvastain, LDL 77 2020   Senile dementia, delirium (HCC) Dementia with mild delirium, stopped Memantine per Dtr's request due to dizziness as possible side effect,off Seroquel    Gait abnormality Gait abnormality, working with therapy.    Left tubo-ovarian mass Left ovarian cystic mass, had pelvic US, [facility]      Family/ staff Communication: plan of care reviewed with the patient and charge nurse.   Labs/tests ordered:  none  Time spend 35 minutes.

## 2020-02-15 NOTE — Assessment & Plan Note (Signed)
Hyperlipidemia, takes Atorvastain, LDL 77 2020

## 2020-02-15 NOTE — Assessment & Plan Note (Signed)
Left ovarian cystic mass, had pelvic US, f/u Gyn

## 2020-02-15 NOTE — Assessment & Plan Note (Addendum)
HTN, takes Losartan.Bun/creat 12/0.57 02/14/20

## 2020-02-15 NOTE — Assessment & Plan Note (Signed)
Persisted HR in 100s regardless off Seroquel, denied SOB, chest pain/pressure,palpitation, TSH 3.5 12/29/19, will add Metoprolol 12.5mg  bid. Observe.

## 2020-02-15 NOTE — Assessment & Plan Note (Addendum)
yelling, screaming, anxious episodes, off Seroquel per HPOA's request.   Depression/axniety, takes Sertraline. The patient usually irritable, little patience in am when she had to wait to get ready for the day.   Will increase Sertraline 75mg  qd, may consider Depakote to stabilize mood if needed.   02/19/20 HPOA declined increasing Sertraline to 75mg  qd.

## 2020-02-15 NOTE — Assessment & Plan Note (Signed)
Lumbar vertebra identified on CT 12/27/19,non surgical management, lumbar brace when up and walking, , f/u neurosurgery,Tramadol prn, Tylenol, Lidocaine patchfor pain

## 2020-02-15 NOTE — Assessment & Plan Note (Signed)
T2DM, Hgb a1c 6.8 12/13/19, takes Glimepiride 20mg  qd.

## 2020-02-15 NOTE — Assessment & Plan Note (Signed)
Hypothyroidism, TSH 3.5 12/29/19, takes Levothyroxine

## 2020-02-25 ENCOUNTER — Encounter: Payer: Self-pay | Admitting: Nurse Practitioner

## 2020-02-25 DIAGNOSIS — R296 Repeated falls: Secondary | ICD-10-CM | POA: Insufficient documentation

## 2020-02-26 ENCOUNTER — Encounter: Payer: Self-pay | Admitting: Nurse Practitioner

## 2020-02-26 ENCOUNTER — Non-Acute Institutional Stay (SKILLED_NURSING_FACILITY): Payer: Medicare Other | Admitting: Nurse Practitioner

## 2020-02-26 DIAGNOSIS — I10 Essential (primary) hypertension: Secondary | ICD-10-CM

## 2020-02-26 DIAGNOSIS — E669 Obesity, unspecified: Secondary | ICD-10-CM

## 2020-02-26 DIAGNOSIS — F418 Other specified anxiety disorders: Secondary | ICD-10-CM

## 2020-02-26 DIAGNOSIS — R269 Unspecified abnormalities of gait and mobility: Secondary | ICD-10-CM

## 2020-02-26 DIAGNOSIS — S32001S Stable burst fracture of unspecified lumbar vertebra, sequela: Secondary | ICD-10-CM

## 2020-02-26 DIAGNOSIS — R296 Repeated falls: Secondary | ICD-10-CM | POA: Diagnosis not present

## 2020-02-26 DIAGNOSIS — N838 Other noninflammatory disorders of ovary, fallopian tube and broad ligament: Secondary | ICD-10-CM

## 2020-02-26 DIAGNOSIS — E1169 Type 2 diabetes mellitus with other specified complication: Secondary | ICD-10-CM

## 2020-02-26 DIAGNOSIS — R000 Tachycardia, unspecified: Secondary | ICD-10-CM

## 2020-02-26 DIAGNOSIS — E038 Other specified hypothyroidism: Secondary | ICD-10-CM

## 2020-02-26 DIAGNOSIS — E782 Mixed hyperlipidemia: Secondary | ICD-10-CM

## 2020-02-26 DIAGNOSIS — F0391 Unspecified dementia with behavioral disturbance: Secondary | ICD-10-CM

## 2020-02-26 DIAGNOSIS — F05 Delirium due to known physiological condition: Secondary | ICD-10-CM

## 2020-02-26 NOTE — Assessment & Plan Note (Signed)
Hyperlipidemia, takes Atorvastatin LDL 77 2020  

## 2020-02-26 NOTE — Assessment & Plan Note (Signed)
Gait abnormality, working with therapy.  

## 2020-02-26 NOTE — Assessment & Plan Note (Signed)
Hypothyroidism, TSH 3.5 12/29/19, takes Levothyroxine  

## 2020-02-26 NOTE — Assessment & Plan Note (Signed)
Left ovarian cystic mass, had pelvic US, f/u Gyn  

## 2020-02-26 NOTE — Progress Notes (Signed)
Location:    [place] [facility] Room Number: 39 Place of Service:  SNF (31) Provider:  Marda Stalker, Lennie Odor NP  [name], MD  Patient Care Team: [name], MD as PCP - General (Internal Medicine)  Extended Emergency Contact Information Primary Emergency Contact: Harris,Margaret Address: [place], [place] [address] [name] of [place] Phone: [phone] Mobile Phone: [phone] Relation: Daughter  Code Status:  DNR Goals of care: Advanced Directive information Advanced Directives 02/28/2020  Does Patient Have a Medical Advance Directive? Yes  Type of Advance Directive [place]  Does patient want to make changes to medical advance directive? No - Patient declined  Copy of [place] in Chart? Yes - validated most recent copy scanned in chart (See row information)  Would patient like information on creating a medical advance directive? -  Pre-existing out of facility DNR order (yellow form or pink MOST form) Yellow form placed in chart (order not valid for inpatient use)     Chief Complaint  Patient presents with   Acute Visit    Fall    HPI:  Pt is a 86 y.o. female seen today for an acute visit for s/p fall 02/24/20 when the patient was found on the floor, the patient has no recollection of the event, no apparent injury noted.             Lumbar vertebra identified on CT 12/27/19,non surgical management, lumbar brace when up and walking, , f/u neurosurgery,Tramadol prn, Tylenol, Lidocaine patchfor pain  Depression, takes Sertraline, occasional emotional outburst   Tachycardia, heart rate is controlled, takes Metoprolol.  Hypothyroidism, TSH 3.5 12/29/19, takes Levothyroxine T2DM, Hgb a1c 6.8 12/13/19, takes Glimepiride 14m qd. HTN, takes Losartan.Bun/creat 12/0.57 02/14/20 Hyperlipidemia, takes Atorvastatin LDL 77  2020 Dementia with mild delirium, stopped Memantine per Dtr's request due to dizziness as possible side effect,off Seroquel  Gait abnormality, working with therapy.  Left ovarian cystic mass, had pelvic [place] f/u Gyn    Past Medical History:  Diagnosis Date   Hearing loss    History of fracture of clavicle    Hypothyroidism    Insomnia    Tinnitus    Past Surgical History:  Procedure Laterality Date   CHOLECYSTECTOMY  2007   Dr. HVerdene Lennert  CLAVICLE EXCISION  1986   MOHS SURGERY     past 10 years chest & legs    Allergies  Allergen Reactions   Metformin And Related Diarrhea    Allergies as of 02/26/2020      Reactions   Metformin And Related Diarrhea      Medication List       Accurate as of February 26, 2020 11:59 PM. If you have any questions, ask your nurse or doctor.        acetaminophen 325 MG tablet Commonly known as: TYLENOL Take 650 mg by mouth in the morning, at noon, and at bedtime.   atorvastatin 10 MG tablet Commonly known as: LIPITOR Take 10 mg by mouth daily.   Blood Glucose Monitoring Suppl Kit Use to test blood sugar once daily daily. Dx: E11.69   Boost Glucose Control Liqd Take by mouth. 1 can three times a day between meals   glimepiride 2 MG tablet Commonly known as: AMARYL Take 2 mg by mouth daily with breakfast.   glucose blood test strip Use to test blood sugar once daily. Dx: E11.69  Lancets 30G Misc Use to test blood sugar once daily. Dx: E11.69   Lancets Misc. Kit 1 Device by Does not apply route daily. Use as directed   levothyroxine 75 MCG tablet Commonly known as: SYNTHROID TAKE 1 TABLET (75 MCG TOTAL) BY MOUTH DAILY BEFORE BREAKFAST.   Lidocaine 4 % Ptch Apply topically every morning.   losartan 25 MG tablet Commonly known as: COZAAR Take 25 mg by mouth in the morning and at bedtime.   metoprolol tartrate 25 MG tablet Commonly known as: LOPRESSOR Take 12.5 mg  by mouth 2 (two) times daily.   sertraline 25 MG tablet Commonly known as: ZOLOFT Take 50 mg by mouth daily.   traMADol 50 MG tablet Commonly known as: ULTRAM Take 25 mg by mouth every 6 (six) hours as needed.   Vitamin D3 50 MCG (2000 UT) Tabs Take 1 tablet by mouth daily.   zinc oxide 20 % ointment Apply 1 application topically as needed for irritation.       Review of Systems  Constitutional: Negative for activity change, fatigue and fever.  HENT: Positive for hearing loss. Negative for congestion and voice change.   Eyes: Negative for visual disturbance.  Respiratory: Negative for cough and shortness of breath.   Cardiovascular: Negative for chest pain, palpitations and leg swelling.  Gastrointestinal: Negative for abdominal pain and constipation.  Genitourinary: Negative for dysuria and urgency.  Musculoskeletal: Positive for arthralgias, back pain and gait problem.  Skin: Negative for color change.  Neurological: Negative for dizziness, speech difficulty and weakness.       Confused.   Psychiatric/Behavioral: Positive for behavioral problems and confusion. Negative for sleep disturbance. The patient is not nervous/anxious.     Immunization History  Administered Date(s) Administered   DTaP 07/20/2013   Influenza, High Dose Seasonal PF 12/07/2016   Influenza,inj,Quad PF,6+ Mos 12/01/2017   Influenza-Unspecified 11/20/2014, 12/12/2015, 11/20/2019   Moderna Sars-Covid-2 Vaccination 03/05/2019, 04/02/2019   PPD Test 08/06/2014   Pneumococcal Conjugate-13 11/07/2013   Pneumococcal Polysaccharide-23 03/22/2017   Zoster 03/01/2006   Zoster Recombinat (Shingrix) 06/07/2017, 08/26/2017   Pertinent  Health Maintenance Due  Topic Date Due   OPHTHALMOLOGY EXAM  Never done   HEMOGLOBIN A1C  06/12/2020   FOOT EXAM  07/10/2020   INFLUENZA VACCINE  Completed   DEXA SCAN  Completed   PNA vac Low Risk Adult  Completed   Fall Risk  12/19/2019 10/17/2019  08/22/2019 07/11/2019 06/13/2019  Falls in the past year? 0 0 0 0 0  Number falls in past yr: 0 0 0 0 0  Injury with Fall? - - 0 - -   Functional Status Survey:    Vitals:   02/26/20 1119  BP: 138/72  Pulse: 91  Resp: 20  Temp: 97.6 F (36.4 C)  SpO2: 97%  Weight: 140 lb 6.4 oz (63.7 kg)  Height: 5' (1.524 m)   Body mass index is 27.42 kg/m. Physical Exam Vitals and nursing note reviewed.  Constitutional:      Appearance: Normal appearance.  HENT:     Head: Normocephalic and atraumatic.     Mouth/Throat:     Mouth: Mucous membranes are moist.  Eyes:     Extraocular Movements: Extraocular movements intact.     Conjunctiva/sclera: Conjunctivae normal.     Pupils: Pupils are equal, round, and reactive to light.  Cardiovascular:     Rate and Rhythm: Regular rhythm. Tachycardia present.     Heart sounds: No murmur heard.   Pulmonary:  Effort: Pulmonary effort is normal.     Breath sounds: No rales.  Abdominal:     General: Bowel sounds are normal.     Palpations: Abdomen is soft.     Tenderness: There is no abdominal tenderness.     Comments: No apparent hemorrhoids, but excoriated anus highly suspected cause of bright blood seen yesterday. The patient is incontinent of BM. Has stools on both hands upon my examination. Her fingers are long-may scratched her anus?.  FOBT negative per nurse.   Musculoskeletal:        General: No tenderness.     Cervical back: Normal range of motion and neck supple.     Right lower leg: No edema.     Left lower leg: No edema.     Comments: Back brace when OOB  Skin:    General: Skin is warm and dry.     Comments: Left parotid mass is resolved.   Neurological:     General: No focal deficit present.     Mental Status: She is alert. Mental status is at baseline.     Motor: No weakness.     Coordination: Coordination normal.     Gait: Gait abnormal.     Comments: Oriented to person, followed simple directions.   Psychiatric:         Mood and Affect: Mood normal.        Behavior: Behavior normal.     Labs reviewed: Recent Labs    12/13/19 0000 12/13/19 0000 12/26/19 2137 12/27/19 0410 01/10/20 0000 01/21/20 0000 01/28/20 0000 02/14/20 0000  NA 141  --  139 136   < > 141 143 143  K 4.3  --  3.6 4.5   < > 4.1 3.8 3.6  CL 104  --  101 103   < > 106 108 108  CO2 27  --   --  22   < > 27* 26* 26*  GLUCOSE 130*  --  169* 127*  --   --   --   --   BUN 16  --  16 17   < > '12 11 12  ' CREATININE 0.78   < > 0.60 0.74   < > 0.7 0.5 0.6  CALCIUM 9.0  --   --  8.8*   < > 8.9 8.7 8.7   < > = values in this interval not displayed.   Recent Labs    04/04/19 0928 08/16/19 0800 12/13/19 0000 01/10/20 0000  AST '19 14 15 18  ' ALT '25 14 11 17  ' ALKPHOS  --   --   --  124  BILITOT 0.5 0.4 0.4  --   PROT 6.4 6.4 6.5  --   ALBUMIN  --   --   --  3.5   Recent Labs    12/26/19 2122 12/26/19 2137 12/27/19 0640 12/28/19 0604 12/29/19 0606 12/29/19 0606 01/10/20 0000 01/21/20 0000 01/28/20 0000 02/07/20 0000  WBC 15.4*  --  14.1* 12.1* 10.0   < > 14.3 9.2 7.2 7.1  NEUTROABS 13.4*  --   --   --   --   --  10,082.00  --  4,673.00  --   HGB 13.5   < > 13.2 12.5 12.2  --  13.6 12.7 12.0 11.0*  HCT 41.7   < > 41.3 39.9 39.3  --  42 40 37 35*  MCV 79.1*  --  80.8 80.8 81.5  --   --   --   --   --  PLT 297  --  256 245 233  --  523* 331 282 333   < > = values in this interval not displayed.   Lab Results  Component Value Date   TSH 3.509 12/29/2019   Lab Results  Component Value Date   HGBA1C 6.8 (H) 12/13/2019   Lab Results  Component Value Date   CHOL 109 12/13/2019   HDL 36 (L) 12/13/2019   LDLCALC 51 12/13/2019   TRIG 132 12/13/2019   CHOLHDL 3.0 12/13/2019    Significant Diagnostic Results in last 30 days:  No results found.  Assessment/Plan There are no diagnoses linked to this encounter.   Family/ staff Communication:   Labs/tests ordered:

## 2020-02-26 NOTE — Assessment & Plan Note (Signed)
Lumbar vertebra identified on CT 12/27/19,non surgical management, lumbar brace when up and walking, , f/u neurosurgery,Tramadol prn, Tylenol, Lidocaine patchfor pain  

## 2020-02-26 NOTE — Assessment & Plan Note (Signed)
Depression, takes Sertraline, occasional emotional outburst

## 2020-02-26 NOTE — Assessment & Plan Note (Signed)
HTN, takes Losartan. Bun/creat 12/0.57 02/14/20 ° ° °

## 2020-02-26 NOTE — Assessment & Plan Note (Signed)
T2DM, Hgb a1c 6.8 12/13/19, takes Glimepiride 2mg qd.  

## 2020-02-26 NOTE — Assessment & Plan Note (Signed)
Tachycardia, heart rate is controlled, takes Metoprolol.   

## 2020-02-26 NOTE — Assessment & Plan Note (Signed)
Dementia with mild delirium, stopped Memantine per Dtr's request due to dizziness as possible side effect,off Seroquel   

## 2020-02-26 NOTE — Assessment & Plan Note (Signed)
s/p fall 02/24/20 when the patient was found on the floor, the patient has no recollection of the event, no apparent injury noted. The patient's lacking of safety awareness and unable to transfer independently are contributory to falling, close supervision/assistance for safety, w/c for mobility.

## 2020-02-27 ENCOUNTER — Encounter: Payer: Self-pay | Admitting: Nurse Practitioner

## 2020-02-28 ENCOUNTER — Encounter: Payer: Self-pay | Admitting: Nurse Practitioner

## 2020-02-28 ENCOUNTER — Non-Acute Institutional Stay (SKILLED_NURSING_FACILITY): Payer: Medicare Other | Admitting: Internal Medicine

## 2020-02-28 ENCOUNTER — Encounter: Payer: Self-pay | Admitting: Internal Medicine

## 2020-02-28 DIAGNOSIS — E1169 Type 2 diabetes mellitus with other specified complication: Secondary | ICD-10-CM

## 2020-02-28 DIAGNOSIS — F0391 Unspecified dementia with behavioral disturbance: Secondary | ICD-10-CM

## 2020-02-28 DIAGNOSIS — I10 Essential (primary) hypertension: Secondary | ICD-10-CM

## 2020-02-28 DIAGNOSIS — R000 Tachycardia, unspecified: Secondary | ICD-10-CM | POA: Diagnosis not present

## 2020-02-28 DIAGNOSIS — F418 Other specified anxiety disorders: Secondary | ICD-10-CM

## 2020-02-28 DIAGNOSIS — F05 Delirium due to known physiological condition: Secondary | ICD-10-CM

## 2020-02-28 DIAGNOSIS — R634 Abnormal weight loss: Secondary | ICD-10-CM

## 2020-02-28 DIAGNOSIS — R296 Repeated falls: Secondary | ICD-10-CM

## 2020-02-28 DIAGNOSIS — F03918 Unspecified dementia, unspecified severity, with other behavioral disturbance: Secondary | ICD-10-CM

## 2020-02-28 DIAGNOSIS — E782 Mixed hyperlipidemia: Secondary | ICD-10-CM

## 2020-02-28 DIAGNOSIS — S32001S Stable burst fracture of unspecified lumbar vertebra, sequela: Secondary | ICD-10-CM

## 2020-02-28 DIAGNOSIS — E669 Obesity, unspecified: Secondary | ICD-10-CM

## 2020-02-28 NOTE — Progress Notes (Signed)
Location:  [facility] Room Number: 39-A Place of Service:  SNF [phone]) Provider:  [name], MD  Patient Care Team: [name], MD as PCP - General (Internal Medicine)  Extended Emergency Contact Information Primary Emergency Contact: Harris,Margaret Address: [place], [place] [address] [name] of [place] Phone: [phone] Mobile Phone: [phone] Relation: Daughter  Code Status:  DNR Goals of care: Advanced Directive information Advanced Directives 02/28/2020  Does Patient Have a Medical Advance Directive? Yes  Type of Advance Directive [place]  Does patient want to make changes to medical advance directive? No - Patient declined  Copy of [place] in Chart? Yes - validated most recent copy scanned in chart (See row information)  Would patient like information on creating a medical advance directive? -  Pre-existing out of facility DNR order (yellow form or pink MOST form) Yellow form placed in chart (order not valid for inpatient use)     Chief Complaint  Patient presents with  . Acute Visit    [name] seen for followup.    HPI:  Pt is a 86 y.o. female seen today for an acute visit for Follow up   Patient was admitted in the hospital from 10/27-10/31 with L4 burst fracture after the fall Patient has a history of type 2 diabetes, hyperlipidemia, depression, dementia, hypothyroidism, osteopenia, stress and urge urinary incontinence [name] here in SNF for therapy  L4 Fracture Doing well with pain control Follows with Neurosurgery Walking very well with mild assist and Walker Weight t loss continues to be the issue Parotid gland swelling US done in Facility did not show any abscess. Started on Augmentin for week. Swelling resolved Tachycardia Was started on Lopressor EKG showed sinus rhythm Cognitive impairment Continues to be the issue. Especially in the  evening time.  Daughter has refused Seroquel for now.  The nurses keep supportive care.  She is called 911 many times Recurrent falls As she tries to get up by herself without calling help  Daughter also wanted to discuss if patient would be able to go back to her apartment. Past Medical History:  Diagnosis Date  . Hearing loss   . History of fracture of clavicle   . Hypothyroidism   . Insomnia   . Tinnitus    Past Surgical History:  Procedure Laterality Date  . CHOLECYSTECTOMY  2007   Dr. Verdene Lennert  . CLAVICLE EXCISION  1986  . MOHS SURGERY     past 10 years chest & legs    Allergies  Allergen Reactions  . Metformin And Related Diarrhea    Outpatient Encounter Medications as of 02/28/2020  Medication Sig  . acetaminophen (TYLENOL) 325 MG tablet Take 650 mg by mouth in the morning, at noon, and at bedtime.  Marland Kitchen atorvastatin (LIPITOR) 10 MG tablet Take 10 mg by mouth daily.  . Cholecalciferol (VITAMIN D3) 2000 units TABS Take 1 tablet by mouth daily.   Marland Kitchen glimepiride (AMARYL) 2 MG tablet Take 2 mg by mouth daily with breakfast.  . levothyroxine (SYNTHROID) 75 MCG tablet TAKE 1 TABLET (75 MCG TOTAL) BY MOUTH DAILY BEFORE BREAKFAST.  Marland Kitchen Lidocaine 4 % PTCH Apply topically every morning.  Marland Kitchen losartan (COZAAR) 25 MG tablet Take 25 mg by mouth in the morning and at bedtime.   . metoprolol tartrate (LOPRESSOR) 25 MG tablet Take 12.5 mg by mouth 2 (two) times daily.  Marland Kitchen  Nutritional Supplements (BOOST GLUCOSE CONTROL) LIQD Take by mouth. 1 can three times a day between meals  . sertraline (ZOLOFT) 25 MG tablet Take 50 mg by mouth daily.   . traMADol (ULTRAM) 50 MG tablet Take 25 mg by mouth every 6 (six) hours as needed.   . zinc oxide 20 % ointment Apply 1 application topically as needed for irritation.  . Blood Glucose Monitoring Suppl KIT Use to test blood sugar once daily daily. Dx: E11.69 (Patient not taking: Reported on 02/28/2020)  . glucose blood test strip Use to test blood sugar once  daily. Dx: E11.69 (Patient not taking: Reported on 02/28/2020)  . Lancets 30G MISC Use to test blood sugar once daily. Dx: E11.69 (Patient not taking: Reported on 02/28/2020)  . Lancets Misc. KIT 1 Device by Does not apply route daily. Use as directed (Patient not taking: Reported on 02/28/2020)   No facility-administered encounter medications on file as of 02/28/2020.    Review of Systems  Constitutional: Positive for activity change, appetite change and unexpected weight change.  HENT: Negative.   Respiratory: Negative.   Cardiovascular: Negative.   Gastrointestinal: Negative.   Genitourinary: Negative.   Musculoskeletal: Positive for gait problem.  Skin: Negative.   Neurological: Positive for weakness.  Psychiatric/Behavioral: Positive for confusion.    Immunization History  Administered Date(s) Administered  . DTaP 07/20/2013  . Influenza, High Dose Seasonal PF 12/07/2016  . Influenza,inj,Quad PF,6+ Mos 12/01/2017  . Influenza-Unspecified 11/20/2014, 12/12/2015, 11/20/2019  . Moderna Sars-Covid-2 Vaccination 03/05/2019, 04/02/2019  . PPD Test 08/06/2014  . Pneumococcal Conjugate-13 11/07/2013  . Pneumococcal Polysaccharide-23 03/22/2017  . Zoster 03/01/2006  . Zoster Recombinat (Shingrix) 06/07/2017, 08/26/2017   Pertinent  Health Maintenance Due  Topic Date Due  . OPHTHALMOLOGY EXAM  Never done  . HEMOGLOBIN A1C  06/12/2020  . FOOT EXAM  07/10/2020  . INFLUENZA VACCINE  Completed  . DEXA SCAN  Completed  . PNA vac Low Risk Adult  Completed   Fall Risk  12/19/2019 10/17/2019 08/22/2019 07/11/2019 06/13/2019  Falls in the past year? 0 0 0 0 0  Number falls in past yr: 0 0 0 0 0  Injury with Fall? - - 0 - -   Functional Status Survey:    Vitals:   02/28/20 1003  BP: 124/72  Pulse: 94  Resp: 20  Temp: 97.6 F (36.4 C)  TempSrc: Oral  SpO2: 97%  Weight: 140 lb 6.4 oz (63.7 kg)  Height: 5' (1.524 m)   Body mass index is 27.42 kg/m. Physical Exam Vitals  reviewed.  Constitutional:      Appearance: Normal appearance.  HENT:     Head: Normocephalic.     Nose: Nose normal.     Mouth/Throat:     Mouth: Mucous membranes are moist.     Pharynx: Oropharynx is clear.  Eyes:     Pupils: Pupils are equal, round, and reactive to light.  Cardiovascular:     Rate and Rhythm: Normal rate and regular rhythm.  Pulmonary:     Effort: Pulmonary effort is normal.  Abdominal:     General: Abdomen is flat. Bowel sounds are normal.     Palpations: Abdomen is soft.  Musculoskeletal:        General: No swelling.     Cervical back: Neck supple.  Skin:    General: Skin is warm.  Neurological:     General: No focal deficit present.     Mental Status: She is alert.  Psychiatric:  Mood and Affect: Mood normal.        Thought Content: Thought content normal.     Labs reviewed: Recent Labs    12/13/19 0000 12/13/19 0000 12/26/19 2137 12/27/19 0410 01/10/20 0000 01/21/20 0000 01/28/20 0000 02/14/20 0000  NA 141  --  139 136   < > 141 143 143  K 4.3  --  3.6 4.5   < > 4.1 3.8 3.6  CL 104  --  101 103   < > 106 108 108  CO2 27  --   --  22   < > 27* 26* 26*  GLUCOSE 130*  --  169* 127*  --   --   --   --   BUN 16  --  16 17   < > '12 11 12  ' CREATININE 0.78   < > 0.60 0.74   < > 0.7 0.5 0.6  CALCIUM 9.0  --   --  8.8*   < > 8.9 8.7 8.7   < > = values in this interval not displayed.   Recent Labs    04/04/19 0928 08/16/19 0800 12/13/19 0000 01/10/20 0000  AST '19 14 15 18  ' ALT '25 14 11 17  ' ALKPHOS  --   --   --  124  BILITOT 0.5 0.4 0.4  --   PROT 6.4 6.4 6.5  --   ALBUMIN  --   --   --  3.5   Recent Labs    12/26/19 2122 12/26/19 2137 12/27/19 0640 12/28/19 0604 12/29/19 0606 12/29/19 0606 01/10/20 0000 01/21/20 0000 01/28/20 0000 02/07/20 0000  WBC 15.4*  --  14.1* 12.1* 10.0   < > 14.3 9.2 7.2 7.1  NEUTROABS 13.4*  --   --   --   --   --  10,082.00  --  4,673.00  --   HGB 13.5   < > 13.2 12.5 12.2  --  13.6 12.7  12.0 11.0*  HCT 41.7   < > 41.3 39.9 39.3  --  42 40 37 35*  MCV 79.1*  --  80.8 80.8 81.5  --   --   --   --   --   PLT 297  --  256 245 233  --  523* 331 282 333   < > = values in this interval not displayed.   Lab Results  Component Value Date   TSH 3.509 12/29/2019   Lab Results  Component Value Date   HGBA1C 6.8 (H) 12/13/2019   Lab Results  Component Value Date   CHOL 109 12/13/2019   HDL 36 (L) 12/13/2019   LDLCALC 51 12/13/2019   TRIG 132 12/13/2019   CHOLHDL 3.0 12/13/2019    Significant Diagnostic Results in last 30 days:  No results found.  Assessment/Plan Frequent falls Continues to be issue due to her confusion and Proximal weakness  Essential hypertension BP on Lower side due to starting on lopressor Change Cozaar to 25 mg QD Continue on low dose of Lopressor Tachycardia Continue on Lopressor for now  Burst fracture of lumbar vertebra, sequela Pain cointrol Discontinue Lidocaine  Wears Brace Follows with Neurosurgery  Depression with anxiety Continue same dose of Zoloft Has refused Remeron for now Diabetes mellitus type 2 in obese ([place]) On Amaryl Senile dementia with delirium with behavioral disturbance ([place]) Continues to have behaviors issues Denies Seroquel. Nurses continues to provide supportive care  Mixed hyperlipidemia On statin Weight loss 15 lbs in total  Appetite improving Discharge planning Patient cannot go back to [place]  Goal is  for her to go to [place] Cognition continues to be issue D/W daughter  Family/ staff Communication:  Labs/tests ordered:  Total time spent in this patient care encounter was 45 _  minutes; greater than 50% of the visit spent counseling patient and staff, reviewing records , Labs and coordinating care for problems addressed at this encounter.

## 2020-03-04 ENCOUNTER — Non-Acute Institutional Stay (SKILLED_NURSING_FACILITY): Payer: Medicare Other | Admitting: Nurse Practitioner

## 2020-03-04 ENCOUNTER — Encounter: Payer: Self-pay | Admitting: Nurse Practitioner

## 2020-03-04 DIAGNOSIS — E782 Mixed hyperlipidemia: Secondary | ICD-10-CM

## 2020-03-04 DIAGNOSIS — F0391 Unspecified dementia with behavioral disturbance: Secondary | ICD-10-CM | POA: Diagnosis not present

## 2020-03-04 DIAGNOSIS — E1169 Type 2 diabetes mellitus with other specified complication: Secondary | ICD-10-CM

## 2020-03-04 DIAGNOSIS — N838 Other noninflammatory disorders of ovary, fallopian tube and broad ligament: Secondary | ICD-10-CM | POA: Diagnosis not present

## 2020-03-04 DIAGNOSIS — F05 Delirium due to known physiological condition: Secondary | ICD-10-CM

## 2020-03-04 DIAGNOSIS — R296 Repeated falls: Secondary | ICD-10-CM

## 2020-03-04 DIAGNOSIS — R269 Unspecified abnormalities of gait and mobility: Secondary | ICD-10-CM

## 2020-03-04 DIAGNOSIS — S32001S Stable burst fracture of unspecified lumbar vertebra, sequela: Secondary | ICD-10-CM

## 2020-03-04 DIAGNOSIS — I10 Essential (primary) hypertension: Secondary | ICD-10-CM

## 2020-03-04 DIAGNOSIS — F418 Other specified anxiety disorders: Secondary | ICD-10-CM

## 2020-03-04 DIAGNOSIS — R000 Tachycardia, unspecified: Secondary | ICD-10-CM

## 2020-03-04 DIAGNOSIS — E038 Other specified hypothyroidism: Secondary | ICD-10-CM

## 2020-03-04 DIAGNOSIS — F03918 Unspecified dementia, unspecified severity, with other behavioral disturbance: Secondary | ICD-10-CM

## 2020-03-04 DIAGNOSIS — E669 Obesity, unspecified: Secondary | ICD-10-CM

## 2020-03-04 NOTE — Assessment & Plan Note (Signed)
T2DM, Hgb a1c 6.8 12/13/19, takes Glimepiride 2mg qd.  

## 2020-03-04 NOTE — Assessment & Plan Note (Addendum)
Lumbar vertebra identified on CT 12/27/19,non surgical management, lumbar brace when up and walking, , f/u neurosurgery,Tramadol prn, Tylenol, for pain 

## 2020-03-04 NOTE — Assessment & Plan Note (Signed)
HTN, takes Losartan. Bun/creat 12/0.57 02/14/20 ° ° °

## 2020-03-04 NOTE — Assessment & Plan Note (Signed)
Gait abnormality, working with therapy, needs assistance for transfer.

## 2020-03-04 NOTE — Assessment & Plan Note (Signed)
Hypothyroidism, TSH 3.5 12/29/19, takes Levothyroxine  

## 2020-03-04 NOTE — Assessment & Plan Note (Signed)
Due to limited safety awareness, impulsive, and transferring with assistance. Close supervision/assistance for safety.

## 2020-03-04 NOTE — Assessment & Plan Note (Signed)
Hyperlipidemia, takes Atorvastatin LDL 77 2020  

## 2020-03-04 NOTE — Assessment & Plan Note (Signed)
Tachycardia, heart rate is controlled, takes Metoprolol.   

## 2020-03-04 NOTE — Progress Notes (Signed)
Location:    [facility] Room Number: 39 Place of Service:  SNF (31) Provider: Lennie Odor  Jon NP  [name], MD  Patient Care Team: [name], MD as PCP - General (Internal Medicine)  Extended Emergency Contact Information Primary Emergency Contact: Harris,Margaret Address: [place], [place] [address] [name] of [place] Phone: [phone] Mobile Phone: [phone] Relation: Daughter  Code Status:  DNR Goals of care: Advanced Directive information Advanced Directives 02/28/2020  Does Patient Have a Medical Advance Directive? Yes  Type of Advance Directive [place]  Does patient want to make changes to medical advance directive? No - Patient declined  Copy of [place] in Chart? Yes - validated most recent copy scanned in chart (See row information)  Would patient like information on creating a medical advance directive? -  Pre-existing out of facility DNR order (yellow form or pink MOST form) Yellow form placed in chart (order not valid for inpatient use)     Chief Complaint  Patient presents with  . Medical Management of Chronic Issues    HPI:  Pt is a 87 y.o. female seen today for medical management of chronic diseases.    Lumbar vertebra identified on CT 12/27/19,non surgical management, lumbar brace when up and walking, , f/u neurosurgery,Tramadol prn, Tylenol, for pain  Frequent falls, limited safety awareness, needs assistance for transfer are contributory to her falling, last unwitnessed fall 03/02/20             Depression, takes Sertraline, occasional emotional outburst              Tachycardia, heart rate is controlled, takes Metoprolol.  Hypothyroidism, TSH 3.5 12/29/19, takes Levothyroxine T2DM, Hgb a1c 6.8 12/13/19, takes Glimepiride 44m qd. HTN, takes Losartan.Bun/creat 12/0.57 02/14/20 Hyperlipidemia, takes  Atorvastatin LDL 77 2020 Dementia with mild delirium, stopped Memantine per Dtr's request due to dizziness as possible side effect,offSeroquel  Gait abnormality, working with therapy.  Left ovarian cystic mass, had pelvic [place] f/u Gyn              Past Medical History:  Diagnosis Date  . Hearing loss   . History of fracture of clavicle   . Hypothyroidism   . Insomnia   . Tinnitus    Past Surgical History:  Procedure Laterality Date  . CHOLECYSTECTOMY  2007   Dr. HVerdene Lennert . CLAVICLE EXCISION  1986  . MOHS SURGERY     past 10 years chest & legs    Allergies  Allergen Reactions  . Metformin And Related Diarrhea    Allergies as of 03/04/2020      Reactions   Metformin And Related Diarrhea      Medication List       Accurate as of March 04, 2020 11:59 PM. If you have any questions, ask your nurse or doctor.        STOP taking these medications   Lidocaine 4 % Ptch Stopped by: Ryen Heitmeyer X Kareena Arrambide, NP     TAKE these medications   acetaminophen 325 MG tablet Commonly known as: TYLENOL Take 650 mg by mouth in the morning, at noon, and at bedtime.   atorvastatin 10 MG tablet Commonly known as: LIPITOR Take 10 mg by mouth daily.   Blood Glucose Monitoring Suppl Kit Use to test blood sugar once daily daily. Dx: E11.69   Boost Glucose Control Liqd Take by mouth. 1  can three times a day between meals   glimepiride 2 MG tablet Commonly known as: AMARYL Take 2 mg by mouth daily with breakfast.   glucose blood test strip Use to test blood sugar once daily. Dx: E11.69   Lancets 30G Misc Use to test blood sugar once daily. Dx: E11.69   Lancets Misc. Kit 1 Device by Does not apply route daily. Use as directed   levothyroxine 75 MCG tablet Commonly known as: SYNTHROID TAKE 1 TABLET (75 MCG TOTAL) BY MOUTH DAILY BEFORE BREAKFAST.   losartan 25 MG tablet Commonly known as: COZAAR Take 25 mg by mouth daily.   metoprolol  tartrate 25 MG tablet Commonly known as: LOPRESSOR Take 12.5 mg by mouth 2 (two) times daily.   sertraline 25 MG tablet Commonly known as: ZOLOFT Take 50 mg by mouth daily.   traMADol 50 MG tablet Commonly known as: ULTRAM Take 25 mg by mouth every 6 (six) hours as needed.   Vitamin D3 50 MCG (2000 UT) Tabs Take 1 tablet by mouth daily.   zinc oxide 20 % ointment Apply 1 application topically as needed for irritation.       Review of Systems  Constitutional: Negative for fatigue, fever and unexpected weight change.  HENT: Positive for hearing loss. Negative for congestion and voice change.   Eyes: Negative for visual disturbance.  Respiratory: Negative for shortness of breath.   Cardiovascular: Negative for leg swelling.  Gastrointestinal: Negative for abdominal pain and constipation.  Genitourinary: Negative for dysuria and urgency.  Musculoskeletal: Positive for arthralgias, back pain and gait problem.  Skin: Negative for color change.  Neurological: Negative for dizziness, speech difficulty and weakness.       Confused.   Psychiatric/Behavioral: Positive for behavioral problems and confusion. Negative for sleep disturbance. The patient is not nervous/anxious.     Immunization History  Administered Date(s) Administered  . DTaP 07/20/2013  . Influenza, High Dose Seasonal PF 12/07/2016  . Influenza,inj,Quad PF,6+ Mos 12/01/2017  . Influenza-Unspecified 11/20/2014, 12/12/2015, 11/20/2019  . Moderna Sars-Covid-2 Vaccination 03/05/2019, 04/02/2019  . PPD Test 08/06/2014  . Pneumococcal Conjugate-13 11/07/2013  . Pneumococcal Polysaccharide-23 03/22/2017  . Zoster 03/01/2006  . Zoster Recombinat (Shingrix) 06/07/2017, 08/26/2017   Pertinent  Health Maintenance Due  Topic Date Due  . OPHTHALMOLOGY EXAM  Never done  . HEMOGLOBIN A1C  06/12/2020  . FOOT EXAM  07/10/2020  . INFLUENZA VACCINE  Completed  . DEXA SCAN  Completed  . PNA vac Low Risk Adult  Completed    Fall Risk  12/19/2019 10/17/2019 08/22/2019 07/11/2019 06/13/2019  Falls in the past year? 0 0 0 0 0  Number falls in past yr: 0 0 0 0 0  Injury with Fall? - - 0 - -   Functional Status Survey:    Vitals:   03/04/20 1428  BP: 116/61  Pulse: 90  Resp: 19  Temp: (!) 97.5 F (36.4 C)  SpO2: 97%  Weight: 140 lb 6.4 oz (63.7 kg)  Height: 5' (1.524 m)   Body mass index is 27.42 kg/m. Physical Exam Vitals and nursing note reviewed.  Constitutional:      Appearance: Normal appearance.  HENT:     Head: Normocephalic and atraumatic.     Mouth/Throat:     Mouth: Mucous membranes are moist.  Eyes:     Extraocular Movements: Extraocular movements intact.     Conjunctiva/sclera: Conjunctivae normal.     Pupils: Pupils are equal, round, and reactive to light.  Cardiovascular:  Rate and Rhythm: Normal rate and regular rhythm.     Heart sounds: No murmur heard.   Pulmonary:     Effort: Pulmonary effort is normal.     Breath sounds: No rales.  Abdominal:     General: Bowel sounds are normal.     Palpations: Abdomen is soft.     Tenderness: There is no abdominal tenderness.     Comments: No apparent hemorrhoids, but excoriated anus highly suspected cause of bright blood seen yesterday. The patient is incontinent of BM. Has stools on both hands upon my examination. Her fingers are long-may scratched her anus?.  FOBT negative per nurse.   Musculoskeletal:        General: No tenderness.     Cervical back: Normal range of motion and neck supple.     Right lower leg: No edema.     Left lower leg: No edema.     Comments: Back brace when OOB  Skin:    General: Skin is warm and dry.     Comments: Left parotid mass is resolved.   Neurological:     General: No focal deficit present.     Mental Status: She is alert. Mental status is at baseline.     Motor: No weakness.     Coordination: Coordination normal.     Gait: Gait abnormal.     Comments: Oriented to person, followed simple  directions.   Psychiatric:        Mood and Affect: Mood normal.        Behavior: Behavior normal.     Labs reviewed: Recent Labs    12/13/19 0000 12/13/19 0000 12/26/19 2137 12/27/19 0410 01/10/20 0000 01/21/20 0000 01/28/20 0000 02/14/20 0000  NA 141  --  139 136   < > 141 143 143  K 4.3  --  3.6 4.5   < > 4.1 3.8 3.6  CL 104  --  101 103   < > 106 108 108  CO2 27  --   --  22   < > 27* 26* 26*  GLUCOSE 130*  --  169* 127*  --   --   --   --   BUN 16  --  16 17   < > '12 11 12  ' CREATININE 0.78   < > 0.60 0.74   < > 0.7 0.5 0.6  CALCIUM 9.0  --   --  8.8*   < > 8.9 8.7 8.7   < > = values in this interval not displayed.   Recent Labs    04/04/19 0928 08/16/19 0800 12/13/19 0000 01/10/20 0000  AST '19 14 15 18  ' ALT '25 14 11 17  ' ALKPHOS  --   --   --  124  BILITOT 0.5 0.4 0.4  --   PROT 6.4 6.4 6.5  --   ALBUMIN  --   --   --  3.5   Recent Labs    12/26/19 2122 12/26/19 2137 12/27/19 0640 12/28/19 0604 12/29/19 0606 12/29/19 0606 01/10/20 0000 01/21/20 0000 01/28/20 0000 02/07/20 0000  WBC 15.4*  --  14.1* 12.1* 10.0   < > 14.3 9.2 7.2 7.1  NEUTROABS 13.4*  --   --   --   --   --  10,082.00  --  4,673.00  --   HGB 13.5   < > 13.2 12.5 12.2  --  13.6 12.7 12.0 11.0*  HCT 41.7   < > 41.3 39.9  39.3  --  42 40 37 35*  MCV 79.1*  --  80.8 80.8 81.5  --   --   --   --   --   PLT 297  --  256 245 233  --  523* 331 282 333   < > = values in this interval not displayed.   Lab Results  Component Value Date   TSH 3.509 12/29/2019   Lab Results  Component Value Date   HGBA1C 6.8 (H) 12/13/2019   Lab Results  Component Value Date   CHOL 109 12/13/2019   HDL 36 (L) 12/13/2019   LDLCALC 51 12/13/2019   TRIG 132 12/13/2019   CHOLHDL 3.0 12/13/2019    Significant Diagnostic Results in last 30 days:  No results found.  Assessment/Plan Senile dementia, delirium ([place]) Dementia with mild delirium, impulsive, limited safety awareness, stopped Memantine per  Dtr's request due to dizziness as possible side effect,offSeroquel. The patient needs close supervision/assistance for safety.    Gait abnormality Gait abnormality, working with therapy, needs assistance for transfer.   Frequent falls Due to limited safety awareness, impulsive, and transferring with assistance. Close supervision/assistance for safety.   Left tubo-ovarian mass Left ovarian cystic mass, had pelvic US, f/u Gyn  Mixed hyperlipidemia Hyperlipidemia, takes Atorvastatin LDL 77 2020  HTN (hypertension) HTN, takes Losartan.Bun/creat 12/0.57 02/14/20  Diabetes mellitus type 2 in obese (HCC) T2DM, Hgb a1c 6.8 12/13/19, takes Glimepiride 56m qd.  Hypothyroidism Hypothyroidism, TSH 3.5 12/29/19, takes Levothyroxine   Tachycardia Tachycardia, heart rate is controlled, takes Metoprolol.   Depression with anxiety Depression, takes Sertraline, occasional emotional outburst    Burst fracture of lumbar vertebra, sequela Lumbar vertebra identified on CT 12/27/19,non surgical management, lumbar brace when up and walking, , f/u neurosurgery,Tramadol prn, Tylenol, for pain    Family/ staff Communication: plan of care reviewed with the patient and charge nurse.   Labs/tests ordered:  None  Time spend 35 minutes.

## 2020-03-04 NOTE — Assessment & Plan Note (Signed)
Left ovarian cystic mass, had pelvic US, f/u Gyn  

## 2020-03-04 NOTE — Assessment & Plan Note (Signed)
Depression, takes Sertraline, occasional emotional outburst 

## 2020-03-04 NOTE — Assessment & Plan Note (Signed)
Dementia with mild delirium, impulsive, limited safety awareness, stopped Memantine per Dtr's request due to dizziness as possible side effect,offSeroquel. The patient needs close supervision/assistance for safety.

## 2020-03-05 ENCOUNTER — Encounter: Payer: Self-pay | Admitting: Nurse Practitioner

## 2020-03-06 ENCOUNTER — Encounter: Payer: Self-pay | Admitting: Internal Medicine

## 2020-03-06 ENCOUNTER — Non-Acute Institutional Stay (SKILLED_NURSING_FACILITY): Payer: Medicare Other | Admitting: Internal Medicine

## 2020-03-06 DIAGNOSIS — F0391 Unspecified dementia with behavioral disturbance: Secondary | ICD-10-CM

## 2020-03-06 DIAGNOSIS — R269 Unspecified abnormalities of gait and mobility: Secondary | ICD-10-CM

## 2020-03-06 DIAGNOSIS — I10 Essential (primary) hypertension: Secondary | ICD-10-CM

## 2020-03-06 DIAGNOSIS — E782 Mixed hyperlipidemia: Secondary | ICD-10-CM

## 2020-03-06 DIAGNOSIS — R000 Tachycardia, unspecified: Secondary | ICD-10-CM

## 2020-03-06 DIAGNOSIS — S32001S Stable burst fracture of unspecified lumbar vertebra, sequela: Secondary | ICD-10-CM | POA: Diagnosis not present

## 2020-03-06 DIAGNOSIS — R296 Repeated falls: Secondary | ICD-10-CM

## 2020-03-06 NOTE — Progress Notes (Signed)
Location:    [facility] Room Number: 39 Place of Service:  SNF [phone]) Provider:  Veleta Miners MD  [name], MD  Patient Care Team: [name], MD as PCP - General (Internal Medicine)  Extended Emergency Contact Information Primary Emergency Contact: Harris,Margaret Address: [address]          [place],  [address] [name] of [place] Phone: [phone] Mobile Phone: [phone] Relation: Daughter  Code Status:  DNR Goals of care: Advanced Directive information Advanced Directives 02/28/2020  Does Patient Have a Medical Advance Directive? Yes  Type of Advance Directive [place]  Does patient want to make changes to medical advance directive? No - Patient declined  Copy of [place] in Chart? Yes - validated most recent copy scanned in chart (See row information)  Would patient like information on creating a medical advance directive? -  Pre-existing out of facility DNR order (yellow form or pink MOST form) Yellow form placed in chart (order not valid for inpatient use)     Chief Complaint  Patient presents with   Acute Visit    F/u behavior issues    HPI:  Pt is a 87 y.o. female seen today for an acute visit for Behaviors Issues and Falls  Patient was admitted in the hospital from 10/27-10/31 with L4 burst fracture after the fall Patient has a history of type 2 diabetes, hyperlipidemia, depression, dementia, hypothyroidism, osteopenia, stress and urge urinary incontinence [name] here in SNF for therapy  L4 fracture Doing well Pain controlled Walking with assist Parotid gland swelling US done in Facility did not show any abscess. Started on Augmentin for week. Swelling resolved Tachycardia Was started on Lopressor EKG showed sinus rhythm Cognitive impairment Continues to be the issue Especially in the evening she is having confusion with Falls. Calling 911 telling them  she needs to go home Daughter wants to discuss her options   Past Medical History:  Diagnosis Date   Hearing loss    History of fracture of clavicle    Hypothyroidism    Insomnia    Tinnitus    Past Surgical History:  Procedure Laterality Date   CHOLECYSTECTOMY  2007   Dr. Verdene Lennert   CLAVICLE EXCISION  1986   MOHS SURGERY     past 10 years chest & legs    Allergies  Allergen Reactions   Metformin And Related Diarrhea    Allergies as of 03/06/2020      Reactions   Metformin And Related Diarrhea      Medication List       Accurate as of March 06, 2020 11:03 AM. If you have any questions, ask your nurse or doctor.        acetaminophen 325 MG tablet Commonly known as: TYLENOL Take 650 mg by mouth in the morning, at noon, and at bedtime.   atorvastatin 10 MG tablet Commonly known as: LIPITOR Take 10 mg by mouth daily.   Blood Glucose Monitoring Suppl Kit Use to test blood sugar once daily daily. Dx: E11.69   Boost Glucose Control Liqd Take by mouth. 1 can three times a day between meals   glimepiride 2 MG tablet Commonly known as: AMARYL Take 2 mg by mouth daily with breakfast.   glucose blood test strip Use to test blood sugar once daily. Dx: E11.69   Lancets 30G Misc Use to test blood sugar once daily. Dx: E11.69   Lancets Misc.  Kit 1 Device by Does not apply route daily. Use as directed   levothyroxine 75 MCG tablet Commonly known as: SYNTHROID TAKE 1 TABLET (75 MCG TOTAL) BY MOUTH DAILY BEFORE BREAKFAST.   losartan 25 MG tablet Commonly known as: COZAAR Take 25 mg by mouth daily.   metoprolol tartrate 25 MG tablet Commonly known as: LOPRESSOR Take 12.5 mg by mouth 2 (two) times daily.   sertraline 25 MG tablet Commonly known as: ZOLOFT Take 50 mg by mouth daily.   traMADol 50 MG tablet Commonly known as: ULTRAM Take 25 mg by mouth every 6 (six) hours as needed.   Vitamin D3 50 MCG (2000 UT) Tabs Take 1 tablet by mouth daily.    zinc oxide 20 % ointment Apply 1 application topically as needed for irritation.       Review of Systems  Constitutional: Positive for activity change and appetite change.  HENT: Negative.   Respiratory: Negative.   Cardiovascular: Negative.   Gastrointestinal: Negative.   Genitourinary: Negative.   Musculoskeletal: Positive for arthralgias and gait problem.  Skin: Negative.   Neurological: Positive for weakness.  Psychiatric/Behavioral: Positive for behavioral problems, confusion and dysphoric mood.    Immunization History  Administered Date(s) Administered   DTaP 07/20/2013   Influenza, High Dose Seasonal PF 12/07/2016   Influenza,inj,Quad PF,6+ Mos 12/01/2017   Influenza-Unspecified 11/20/2014, 12/12/2015, 11/20/2019   Moderna Sars-Covid-2 Vaccination 03/05/2019, 04/02/2019   PPD Test 08/06/2014   Pneumococcal Conjugate-13 11/07/2013   Pneumococcal Polysaccharide-23 03/22/2017   Zoster 03/01/2006   Zoster Recombinat (Shingrix) 06/07/2017, 08/26/2017   Pertinent  Health Maintenance Due  Topic Date Due   OPHTHALMOLOGY EXAM  Never done   HEMOGLOBIN A1C  06/12/2020   FOOT EXAM  07/10/2020   INFLUENZA VACCINE  Completed   DEXA SCAN  Completed   PNA vac Low Risk Adult  Completed   Fall Risk  12/19/2019 10/17/2019 08/22/2019 07/11/2019 06/13/2019  Falls in the past year? 0 0 0 0 0  Number falls in past yr: 0 0 0 0 0  Injury with Fall? - - 0 - -   Functional Status Survey:    Vitals:   03/06/20 1054  BP: 131/77  Pulse: 93  Resp: 19  Temp: 97.6 F (36.4 C)  SpO2: 98%  Weight: 140 lb 6.4 oz (63.7 kg)  Height: 5' (1.524 m)   Body mass index is 27.42 kg/m. Physical Exam Vitals reviewed.  Constitutional:      Appearance: Normal appearance.  HENT:     Head: Normocephalic.     Nose: Nose normal.     Mouth/Throat:     Mouth: Mucous membranes are moist.     Pharynx: Oropharynx is clear.  Eyes:     Pupils: Pupils are equal, round, and reactive  to light.  Cardiovascular:     Rate and Rhythm: Normal rate and regular rhythm.     Pulses: Normal pulses.     Heart sounds: Normal heart sounds.  Pulmonary:     Effort: Pulmonary effort is normal.     Breath sounds: Normal breath sounds.  Abdominal:     General: Abdomen is flat. Bowel sounds are normal.     Palpations: Abdomen is soft.  Musculoskeletal:        General: No swelling or tenderness.     Cervical back: Neck supple.  Skin:    General: Skin is warm.  Neurological:     General: No focal deficit present.     Mental Status: She  is alert.  Psychiatric:        Mood and Affect: Mood normal.     Labs reviewed: Recent Labs    12/13/19 0000 12/13/19 0000 12/26/19 2137 12/27/19 0410 01/10/20 0000 01/21/20 0000 01/28/20 0000 02/14/20 0000  NA 141  --  139 136   < > 141 143 143  K 4.3  --  3.6 4.5   < > 4.1 3.8 3.6  CL 104  --  101 103   < > 106 108 108  CO2 27  --   --  22   < > 27* 26* 26*  GLUCOSE 130*  --  169* 127*  --   --   --   --   BUN 16  --  16 17   < > '12 11 12  ' CREATININE 0.78   < > 0.60 0.74   < > 0.7 0.5 0.6  CALCIUM 9.0  --   --  8.8*   < > 8.9 8.7 8.7   < > = values in this interval not displayed.   Recent Labs    04/04/19 0928 08/16/19 0800 12/13/19 0000 01/10/20 0000  AST '19 14 15 18  ' ALT '25 14 11 17  ' ALKPHOS  --   --   --  124  BILITOT 0.5 0.4 0.4  --   PROT 6.4 6.4 6.5  --   ALBUMIN  --   --   --  3.5   Recent Labs    12/26/19 2122 12/26/19 2137 12/27/19 0640 12/28/19 0604 12/29/19 0606 12/29/19 0606 01/10/20 0000 01/21/20 0000 01/28/20 0000 02/07/20 0000  WBC 15.4*  --  14.1* 12.1* 10.0   < > 14.3 9.2 7.2 7.1  NEUTROABS 13.4*  --   --   --   --   --  10,082.00  --  4,673.00  --   HGB 13.5   < > 13.2 12.5 12.2  --  13.6 12.7 12.0 11.0*  HCT 41.7   < > 41.3 39.9 39.3  --  42 40 37 35*  MCV 79.1*  --  80.8 80.8 81.5  --   --   --   --   --   PLT 297  --  256 245 233  --  523* 331 282 333   < > = values in this interval not  displayed.   Lab Results  Component Value Date   TSH 3.509 12/29/2019   Lab Results  Component Value Date   HGBA1C 6.8 (H) 12/13/2019   Lab Results  Component Value Date   CHOL 109 12/13/2019   HDL 36 (L) 12/13/2019   LDLCALC 51 12/13/2019   TRIG 132 12/13/2019   CHOLHDL 3.0 12/13/2019    Significant Diagnostic Results in last 30 days:  No results found.  Assessment/Plan Dementia with behavioral disturbance, unspecified dementia type ([place]) Start on Seroquel 12.5 mg QPM Will consider Depakote of continues to have issues  Other issues  Essential hypertension BP was on Lower side due to starting on lopressor Changed Cozaar to 25 mg QD Continue on low dose of Lopressor Tachycardia Continue on Lopressor for now  Burst fracture of lumbar vertebra, sequela Pain cointrol Discontinue Lidocaine  Wears Brace Follows with Neurosurgery  Depression with anxiety Continue same dose of Zoloft Has refused Remeron for now Diabetes mellitus type 2 in obese (HCC) On Amaryl Mixed hyperlipidemia On statin Weight loss 15 lbs in total Appetite improving Discharge planning Patient cannot go back to [place]  Goal is  for her to go to [place] Cognition continues to be issue D/W daughter    Family/ staff Communication:   Labs/tests ordered:

## 2020-03-10 ENCOUNTER — Encounter: Payer: Self-pay | Admitting: Nurse Practitioner

## 2020-03-10 ENCOUNTER — Non-Acute Institutional Stay (SKILLED_NURSING_FACILITY): Payer: Medicare Other | Admitting: Nurse Practitioner

## 2020-03-10 DIAGNOSIS — F418 Other specified anxiety disorders: Secondary | ICD-10-CM | POA: Diagnosis not present

## 2020-03-10 DIAGNOSIS — R296 Repeated falls: Secondary | ICD-10-CM

## 2020-03-10 DIAGNOSIS — I10 Essential (primary) hypertension: Secondary | ICD-10-CM | POA: Diagnosis not present

## 2020-03-10 DIAGNOSIS — E782 Mixed hyperlipidemia: Secondary | ICD-10-CM

## 2020-03-10 DIAGNOSIS — F0391 Unspecified dementia with behavioral disturbance: Secondary | ICD-10-CM

## 2020-03-10 DIAGNOSIS — S32001S Stable burst fracture of unspecified lumbar vertebra, sequela: Secondary | ICD-10-CM

## 2020-03-10 DIAGNOSIS — R269 Unspecified abnormalities of gait and mobility: Secondary | ICD-10-CM

## 2020-03-10 DIAGNOSIS — F05 Delirium due to known physiological condition: Secondary | ICD-10-CM

## 2020-03-10 DIAGNOSIS — N838 Other noninflammatory disorders of ovary, fallopian tube and broad ligament: Secondary | ICD-10-CM

## 2020-03-10 DIAGNOSIS — R000 Tachycardia, unspecified: Secondary | ICD-10-CM | POA: Diagnosis not present

## 2020-03-10 DIAGNOSIS — E1169 Type 2 diabetes mellitus with other specified complication: Secondary | ICD-10-CM

## 2020-03-10 DIAGNOSIS — E669 Obesity, unspecified: Secondary | ICD-10-CM

## 2020-03-10 DIAGNOSIS — E038 Other specified hypothyroidism: Secondary | ICD-10-CM

## 2020-03-10 DIAGNOSIS — F03918 Unspecified dementia, unspecified severity, with other behavioral disturbance: Secondary | ICD-10-CM

## 2020-03-10 NOTE — Assessment & Plan Note (Signed)
Left ovarian cystic mass, had pelvic US, f/u Gyn  

## 2020-03-10 NOTE — Assessment & Plan Note (Signed)
HTN, takes Losartan, Metoprolol, Bun/creat 12/0.57 02/14/20

## 2020-03-10 NOTE — Assessment & Plan Note (Signed)
Lumbar vertebra identified on CT 12/27/19,non surgical management, lumbar brace when up and walking, , f/u neurosurgery,Tramadol prn, Tylenol, for pain

## 2020-03-10 NOTE — Assessment & Plan Note (Signed)
T2DM, Hgb a1c 6.8 12/13/19, takes Glimepiride 2mg  qd.

## 2020-03-10 NOTE — Assessment & Plan Note (Signed)
Dementia with mild delirium, stopped Memantine per Dtr's request due to dizziness as possible side effect,resumed Seroquel

## 2020-03-10 NOTE — Assessment & Plan Note (Signed)
Hyperlipidemia, takes Atorvastatin LDL 77 2020

## 2020-03-10 NOTE — Assessment & Plan Note (Signed)
Gait abnormality, working with therapy.  

## 2020-03-10 NOTE — Progress Notes (Signed)
Location:    [place] [facility] Room Number: 39 Place of Service:  SNF (31) Provider: Lennie Odor Jolonda Gomm NP  [name], MD  Patient Care Team: [name], MD as PCP - General (Internal Medicine)  Extended Emergency Contact Information Primary Emergency Contact: Harris,Margaret Address: [place], [place] [address] [name] of [place] Phone: [phone] Mobile Phone: [phone] Relation: Daughter  Code Status:  DNR Goals of care: Advanced Directive information Advanced Directives 02/28/2020  Does Patient Have a Medical Advance Directive? Yes  Type of Advance Directive [place]  Does patient want to make changes to medical advance directive? No - Patient declined  Copy of [place] in Chart? Yes - validated most recent copy scanned in chart (See row information)  Would patient like information on creating a medical advance directive? -  Pre-existing out of facility DNR order (yellow form or pink MOST form) Yellow form placed in chart (order not valid for inpatient use)     Chief Complaint  Patient presents with  . Acute Visit    Fall    HPI:  Pt is a 87 y.o. female seen today for an acute visit for fall 1/822, unwitnessed, no apparent injury, the patient has no recollection of the event.   Lumbar vertebra identified on CT 12/27/19,non surgical management, lumbar brace when up and walking, , f/u neurosurgery,Tramadol prn, Tylenol, for pain             Frequent falls, limited safety awareness, needs assistance for transfer are contributory to her falling Depression, takes Sertraline, occasional emotional outburst, Seroquel 12.54m qpm since 03/06/20.  Tachycardia, heart rate is controlled, takes Metoprolol.  Hypothyroidism, TSH 3.5 12/29/19, takes Levothyroxine T2DM, Hgb a1c 6.8 12/13/19, takes Glimepiride 239mqd. HTN, takes  Losartan, Metoprolol, Bun/creat 12/0.57 02/14/20 Hyperlipidemia, takes Atorvastatin LDL 77 2020 Dementia with mild delirium, stopped Memantine per Dtr's request due to dizziness as possible side effect,resumed Seroquel  Gait abnormality, working with therapy.  Left ovarian cystic mass, had pelvic USKoreaf/u Gyn    Past Medical History:  Diagnosis Date  . Hearing loss   . History of fracture of clavicle   . Hypothyroidism   . Insomnia   . Tinnitus    Past Surgical History:  Procedure Laterality Date  . CHOLECYSTECTOMY  2007   Dr. HyVerdene Lennert. CLAVICLE EXCISION  1986  . MOHS SURGERY     past 10 years chest & legs    Allergies  Allergen Reactions  . Metformin And Related Diarrhea    Allergies as of 03/10/2020      Reactions   Metformin And Related Diarrhea      Medication List       Accurate as of March 10, 2020 11:59 PM. If you have any questions, ask your nurse or doctor.        acetaminophen 325 MG tablet Commonly known as: TYLENOL Take 650 mg by mouth in the morning, at noon, and at bedtime.   atorvastatin 10 MG tablet Commonly known as: LIPITOR Take 10 mg by mouth daily.   Blood Glucose Monitoring Suppl Kit Use to test blood sugar once daily daily. Dx: E11.69   Boost Glucose Control Liqd Take by mouth. 1 can three times a day between meals   glimepiride 2 MG tablet Commonly known as: AMARYL Take 2 mg by mouth daily with breakfast.   glucose blood test strip  Use to test blood sugar once daily. Dx: E11.69   Lancets 30G Misc Use to test blood sugar once daily. Dx: E11.69   Lancets Misc. Kit 1 Device by Does not apply route daily. Use as directed   levothyroxine 75 MCG tablet Commonly known as: SYNTHROID TAKE 1 TABLET (75 MCG TOTAL) BY MOUTH DAILY BEFORE BREAKFAST.   losartan 25 MG tablet Commonly known as: COZAAR Take 25 mg by mouth daily.   metoprolol tartrate 25 MG  tablet Commonly known as: LOPRESSOR Take 12.5 mg by mouth 2 (two) times daily.   QUEtiapine 25 MG tablet Commonly known as: SEROQUEL Take 12.5 mg by mouth at bedtime.   sertraline 25 MG tablet Commonly known as: ZOLOFT Take 50 mg by mouth daily.   traMADol 50 MG tablet Commonly known as: ULTRAM Take 25 mg by mouth every 6 (six) hours as needed.   Vitamin D3 50 MCG (2000 UT) Tabs Take 1 tablet by mouth daily.   zinc oxide 20 % ointment Apply 1 application topically as needed for irritation.       Review of Systems  Constitutional: Negative for activity change, appetite change and fever.  HENT: Positive for hearing loss. Negative for congestion and voice change.   Eyes: Negative for visual disturbance.  Respiratory: Negative for shortness of breath.   Cardiovascular: Negative for leg swelling.  Gastrointestinal: Negative for abdominal pain and constipation.  Genitourinary: Negative for dysuria and urgency.  Musculoskeletal: Positive for arthralgias, back pain and gait problem.  Skin: Negative for color change.  Neurological: Negative for speech difficulty, weakness, light-headedness and headaches.       Confused.   Psychiatric/Behavioral: Positive for behavioral problems and confusion. Negative for sleep disturbance. The patient is not nervous/anxious.     Immunization History  Administered Date(s) Administered  . DTaP 07/20/2013  . Influenza, High Dose Seasonal PF 12/07/2016  . Influenza,inj,Quad PF,6+ Mos 12/01/2017  . Influenza-Unspecified 11/20/2014, 12/12/2015, 11/20/2019  . Moderna Sars-Covid-2 Vaccination 03/05/2019, 04/02/2019  . PPD Test 08/06/2014  . Pneumococcal Conjugate-13 11/07/2013  . Pneumococcal Polysaccharide-23 03/22/2017  . Zoster 03/01/2006  . Zoster Recombinat (Shingrix) 06/07/2017, 08/26/2017   Pertinent  Health Maintenance Due  Topic Date Due  . OPHTHALMOLOGY EXAM  Never done  . HEMOGLOBIN A1C  06/12/2020  . FOOT EXAM  07/10/2020  .  INFLUENZA VACCINE  Completed  . DEXA SCAN  Completed  . PNA vac Low Risk Adult  Completed   Fall Risk  12/19/2019 10/17/2019 08/22/2019 07/11/2019 06/13/2019  Falls in the past year? 0 0 0 0 0  Number falls in past yr: 0 0 0 0 0  Injury with Fall? - - 0 - -   Functional Status Survey:    Vitals:   03/10/20 1518  BP: 121/72  Pulse: 90  Resp: (!) 22  Temp: (!) 97.2 F (36.2 C)  SpO2: 97%  Weight: 140 lb 6.4 oz (63.7 kg)  Height: 5' (1.524 m)   Body mass index is 27.42 kg/m. Physical Exam Vitals and nursing note reviewed.  Constitutional:      Appearance: Normal appearance.  HENT:     Head: Normocephalic and atraumatic.     Mouth/Throat:     Mouth: Mucous membranes are moist.  Eyes:     Extraocular Movements: Extraocular movements intact.     Conjunctiva/sclera: Conjunctivae normal.     Pupils: Pupils are equal, round, and reactive to light.  Cardiovascular:     Rate and Rhythm: Normal rate and regular rhythm.  Heart sounds: No murmur heard.   Pulmonary:     Effort: Pulmonary effort is normal.     Breath sounds: No rales.  Abdominal:     General: Bowel sounds are normal.     Palpations: Abdomen is soft.     Tenderness: There is no abdominal tenderness.  Musculoskeletal:        General: No tenderness.     Cervical back: Normal range of motion and neck supple.     Right lower leg: No edema.     Left lower leg: No edema.     Comments: Back brace when OOB  Skin:    General: Skin is warm and dry.  Neurological:     General: No focal deficit present.     Mental Status: She is alert. Mental status is at baseline.     Motor: No weakness.     Coordination: Coordination normal.     Gait: Gait abnormal.     Comments: Oriented to person, followed simple directions.   Psychiatric:        Mood and Affect: Mood normal.        Behavior: Behavior normal.     Labs reviewed: Recent Labs    12/13/19 0000 12/13/19 0000 12/26/19 2137 12/27/19 0410 01/10/20 0000  01/21/20 0000 01/28/20 0000 02/14/20 0000  NA 141  --  139 136   < > 141 143 143  K 4.3  --  3.6 4.5   < > 4.1 3.8 3.6  CL 104  --  101 103   < > 106 108 108  CO2 27  --   --  22   < > 27* 26* 26*  GLUCOSE 130*  --  169* 127*  --   --   --   --   BUN 16  --  16 17   < > '12 11 12  ' CREATININE 0.78   < > 0.60 0.74   < > 0.7 0.5 0.6  CALCIUM 9.0  --   --  8.8*   < > 8.9 8.7 8.7   < > = values in this interval not displayed.   Recent Labs    04/04/19 0928 08/16/19 0800 12/13/19 0000 01/10/20 0000  AST '19 14 15 18  ' ALT '25 14 11 17  ' ALKPHOS  --   --   --  124  BILITOT 0.5 0.4 0.4  --   PROT 6.4 6.4 6.5  --   ALBUMIN  --   --   --  3.5   Recent Labs    12/26/19 2122 12/26/19 2137 12/27/19 0640 12/28/19 0604 12/29/19 0606 12/29/19 0606 01/10/20 0000 01/21/20 0000 01/28/20 0000 02/07/20 0000  WBC 15.4*  --  14.1* 12.1* 10.0   < > 14.3 9.2 7.2 7.1  NEUTROABS 13.4*  --   --   --   --   --  10,082.00  --  4,673.00  --   HGB 13.5   < > 13.2 12.5 12.2  --  13.6 12.7 12.0 11.0*  HCT 41.7   < > 41.3 39.9 39.3  --  42 40 37 35*  MCV 79.1*  --  80.8 80.8 81.5  --   --   --   --   --   PLT 297  --  256 245 233  --  523* 331 282 333   < > = values in this interval not displayed.   Lab Results  Component Value Date  TSH 3.509 12/29/2019   Lab Results  Component Value Date   HGBA1C 6.8 (H) 12/13/2019   Lab Results  Component Value Date   CHOL 109 12/13/2019   HDL 36 (L) 12/13/2019   LDLCALC 51 12/13/2019   TRIG 132 12/13/2019   CHOLHDL 3.0 12/13/2019    Significant Diagnostic Results in last 30 days:  No results found.  Assessment/Plan Frequent falls fall 1/822, unwitnessed, no apparent injury, the patient has no recollection of the event. Frequent falls, limited safety awareness, needs assistance for transfer are contributory to her falling,   Burst fracture of lumbar vertebra, sequela Lumbar vertebra identified on CT 12/27/19,non surgical management, lumbar brace  when up and walking, , f/u neurosurgery,Tramadol prn, Tylenol, for pain  Depression with anxiety Depression, takes Sertraline, occasional emotional outburst, Seroquel 12.64m qpm since 03/06/20.   Tachycardia Tachycardia, heart rate is controlled, takes Metoprolol.    Hypothyroidism Hypothyroidism, TSH 3.5 12/29/19, takes Levothyroxine   Diabetes mellitus type 2 in obese (HCC) T2DM, Hgb a1c 6.8 12/13/19, takes Glimepiride 267mqd.   HTN (hypertension) HTN, takes Losartan, Metoprolol, Bun/creat 12/0.57 02/14/20  Mixed hyperlipidemia Hyperlipidemia, takes Atorvastatin LDL 77 2020   Senile dementia, delirium (HCC) Dementia with mild delirium, stopped Memantine per Dtr's request due to dizziness as possible side effect,resumed Seroquel    Gait abnormality Gait abnormality, working with therapy.   Left tubo-ovarian mass Left ovarian cystic mass, had pelvic USKoreaf/u Gyn    Family/ staff Communication: plan of care reviewed with the patient and charge nurse.   Labs/tests ordered: none  Time spend 35 minutes.

## 2020-03-10 NOTE — Assessment & Plan Note (Signed)
Depression, takes Sertraline, occasional emotional outburst, Seroquel 12.5mg  qpm since 03/06/20.

## 2020-03-10 NOTE — Assessment & Plan Note (Signed)
Hypothyroidism, TSH 3.5 12/29/19, takes Levothyroxine  

## 2020-03-10 NOTE — Assessment & Plan Note (Signed)
Tachycardia, heart rate is controlled, takes Metoprolol.

## 2020-03-10 NOTE — Assessment & Plan Note (Signed)
fall 1/822, unwitnessed, no apparent injury, the patient has no recollection of the event. Frequent falls, limited safety awareness, needs assistance for transfer are contributory to her falling,

## 2020-03-11 ENCOUNTER — Encounter: Payer: Self-pay | Admitting: Nurse Practitioner

## 2020-03-13 DIAGNOSIS — S32040A Wedge compression fracture of fourth lumbar vertebra, initial encounter for closed fracture: Secondary | ICD-10-CM | POA: Diagnosis not present

## 2020-03-20 ENCOUNTER — Encounter: Payer: Self-pay | Admitting: Internal Medicine

## 2020-03-20 ENCOUNTER — Non-Acute Institutional Stay (SKILLED_NURSING_FACILITY): Payer: Medicare Other | Admitting: Internal Medicine

## 2020-03-20 DIAGNOSIS — R000 Tachycardia, unspecified: Secondary | ICD-10-CM | POA: Diagnosis not present

## 2020-03-20 DIAGNOSIS — R296 Repeated falls: Secondary | ICD-10-CM

## 2020-03-20 DIAGNOSIS — F418 Other specified anxiety disorders: Secondary | ICD-10-CM

## 2020-03-20 DIAGNOSIS — E782 Mixed hyperlipidemia: Secondary | ICD-10-CM

## 2020-03-20 DIAGNOSIS — F05 Delirium due to known physiological condition: Secondary | ICD-10-CM | POA: Diagnosis not present

## 2020-03-20 DIAGNOSIS — F0391 Unspecified dementia with behavioral disturbance: Secondary | ICD-10-CM

## 2020-03-20 DIAGNOSIS — I10 Essential (primary) hypertension: Secondary | ICD-10-CM | POA: Diagnosis not present

## 2020-03-20 DIAGNOSIS — S32001S Stable burst fracture of unspecified lumbar vertebra, sequela: Secondary | ICD-10-CM | POA: Diagnosis not present

## 2020-03-20 DIAGNOSIS — F03918 Unspecified dementia, unspecified severity, with other behavioral disturbance: Secondary | ICD-10-CM

## 2020-03-20 NOTE — Progress Notes (Unsigned)
Location:    [facility] Room Number: 39 Place of Service:  SNF [phone]) Provider:  Veleta Miners MD  [name], MD  Patient Care Team: [name], MD as PCP - General (Internal Medicine)  Extended Emergency Contact Information Primary Emergency Contact: Harris,Margaret Address: [address]          [place],  [address] [name] of [place] Phone: [phone] Mobile Phone: [phone] Relation: Daughter  Code Status:  DNR Goals of care: Advanced Directive information Advanced Directives 02/28/2020  Does Patient Have a Medical Advance Directive? Yes  Type of Advance Directive [place]  Does patient want to make changes to medical advance directive? No - Patient declined  Copy of [place] in Chart? Yes - validated most recent copy scanned in chart (See row information)  Would patient like information on creating a medical advance directive? -  Pre-existing out of facility DNR order (yellow form or pink MOST form) Yellow form placed in chart (order not valid for inpatient use)     Chief Complaint  Patient presents with  . Acute Visit    Behaviors and Agitation    HPI:  Pt is a 87 y.o. female seen today for an acute visit for Behaviors and Agitation  Patient was admitted in the hospital from 10/27-10/31 with L4 burst fracture after the fall Patient has a history of type 2 diabetes, hyperlipidemia, depression, dementia, hypothyroidism, osteopenia, stress and urge urinary incontinence [name] here in SNF for therapy  Patient continues to have cognitive impairment issues.  Specially in the evening.  She is try to leave the facility called 911 many times.  This morning she was upset because she thought that the facility bus has left her and she was supposed to go to [place]. She was able to come down when her daughter came All other issues L4 fracture Doing well Pain controlled Walking  with assist Parotid gland swelling US done in Facility did not show any abscess. Started on Augmentin for week.Swelling resolved Tachycardia Was started on Lopressor EKG showed sinus rhythm  Past Medical History:  Diagnosis Date  . Hearing loss   . History of fracture of clavicle   . Hypothyroidism   . Insomnia   . Tinnitus    Past Surgical History:  Procedure Laterality Date  . CHOLECYSTECTOMY  2007   Dr. Verdene Lennert  . CLAVICLE EXCISION  1986  . MOHS SURGERY     past 10 years chest & legs    Allergies  Allergen Reactions  . Metformin And Related Diarrhea    Allergies as of 03/20/2020      Reactions   Metformin And Related Diarrhea      Medication List       Accurate as of March 20, 2020  3:10 PM. If you have any questions, ask your nurse or doctor.        acetaminophen 325 MG tablet Commonly known as: TYLENOL Take 650 mg by mouth in the morning, at noon, and at bedtime.   atorvastatin 10 MG tablet Commonly known as: LIPITOR Take 10 mg by mouth daily.   Blood Glucose Monitoring Suppl Kit Use to test blood sugar once daily daily. Dx: E11.69   Boost Glucose Control Liqd Take by mouth. 1 can three times a day between meals   glimepiride 2 MG tablet Commonly known as: AMARYL Take 2 mg by mouth daily with breakfast.   glucose blood test strip  Use to test blood sugar once daily. Dx: E11.69   Lancets 30G Misc Use to test blood sugar once daily. Dx: E11.69   Lancets Misc. Kit 1 Device by Does not apply route daily. Use as directed   levothyroxine 75 MCG tablet Commonly known as: SYNTHROID TAKE 1 TABLET (75 MCG TOTAL) BY MOUTH DAILY BEFORE BREAKFAST.   losartan 25 MG tablet Commonly known as: COZAAR Take 25 mg by mouth daily.   metoprolol tartrate 25 MG tablet Commonly known as: LOPRESSOR Take 12.5 mg by mouth 2 (two) times daily.   QUEtiapine 25 MG tablet Commonly known as: SEROQUEL Take 12.5 mg by mouth at bedtime.   sertraline 25 MG  tablet Commonly known as: ZOLOFT Take 50 mg by mouth daily.   traMADol 50 MG tablet Commonly known as: ULTRAM Take 25 mg by mouth every 6 (six) hours as needed.   Vitamin D3 50 MCG (2000 UT) Tabs Take 1 tablet by mouth daily.   zinc oxide 20 % ointment Apply 1 application topically as needed for irritation.       Review of Systems  Constitutional: Positive for activity change and appetite change.  HENT: Negative.   Respiratory: Negative.   Cardiovascular: Negative.   Gastrointestinal: Negative.   Genitourinary: Negative.   Musculoskeletal: Positive for gait problem.  Skin: Negative.   Neurological: Positive for weakness.  Psychiatric/Behavioral: Positive for behavioral problems, confusion and sleep disturbance.    Immunization History  Administered Date(s) Administered  . DTaP 07/20/2013  . Influenza, High Dose Seasonal PF 12/07/2016  . Influenza,inj,Quad PF,6+ Mos 12/01/2017  . Influenza-Unspecified 11/20/2014, 12/12/2015, 11/20/2019  . Moderna Sars-Covid-2 Vaccination 03/05/2019, 04/02/2019  . PPD Test 08/06/2014  . Pneumococcal Conjugate-13 11/07/2013  . Pneumococcal Polysaccharide-23 03/22/2017  . Zoster 03/01/2006  . Zoster Recombinat (Shingrix) 06/07/2017, 08/26/2017   Pertinent  Health Maintenance Due  Topic Date Due  . OPHTHALMOLOGY EXAM  Never done  . HEMOGLOBIN A1C  06/12/2020  . FOOT EXAM  07/10/2020  . INFLUENZA VACCINE  Completed  . DEXA SCAN  Completed  . PNA vac Low Risk Adult  Completed   Fall Risk  12/19/2019 10/17/2019 08/22/2019 07/11/2019 06/13/2019  Falls in the past year? 0 0 0 0 0  Number falls in past yr: 0 0 0 0 0  Injury with Fall? - - 0 - -   Functional Status Survey:    Vitals:   03/20/20 1503  BP: 126/72  Pulse: 92  Resp: 20  Temp: 97.7 F (36.5 C)  SpO2: 96%  Weight: 140 lb 6.4 oz (63.7 kg)  Height: 5' (1.524 m)   Body mass index is 27.42 kg/m. Physical Exam Vitals reviewed.  Constitutional:      Appearance: Normal  appearance.  HENT:     Head: Normocephalic.     Nose: Nose normal.     Mouth/Throat:     Mouth: Mucous membranes are moist.     Pharynx: Oropharynx is clear.  Eyes:     Pupils: Pupils are equal, round, and reactive to light.  Cardiovascular:     Rate and Rhythm: Normal rate and regular rhythm.     Pulses: Normal pulses.  Pulmonary:     Breath sounds: Normal breath sounds.  Abdominal:     General: Abdomen is flat. Bowel sounds are normal.     Palpations: Abdomen is soft.  Musculoskeletal:        General: No swelling.     Cervical back: Neck supple.  Skin:  General: Skin is warm.  Neurological:     General: No focal deficit present.     Mental Status: She is alert.  Psychiatric:        Mood and Affect: Mood normal.        Thought Content: Thought content normal.     Labs reviewed: Recent Labs    12/13/19 0000 12/13/19 0000 12/26/19 2137 12/27/19 0410 01/10/20 0000 01/21/20 0000 01/28/20 0000 02/14/20 0000  NA 141  --  139 136   < > 141 143 143  K 4.3  --  3.6 4.5   < > 4.1 3.8 3.6  CL 104  --  101 103   < > 106 108 108  CO2 27  --   --  22   < > 27* 26* 26*  GLUCOSE 130*  --  169* 127*  --   --   --   --   BUN 16  --  16 17   < > '12 11 12  ' CREATININE 0.78   < > 0.60 0.74   < > 0.7 0.5 0.6  CALCIUM 9.0  --   --  8.8*   < > 8.9 8.7 8.7   < > = values in this interval not displayed.   Recent Labs    04/04/19 0928 08/16/19 0800 12/13/19 0000 01/10/20 0000  AST '19 14 15 18  ' ALT '25 14 11 17  ' ALKPHOS  --   --   --  124  BILITOT 0.5 0.4 0.4  --   PROT 6.4 6.4 6.5  --   ALBUMIN  --   --   --  3.5   Recent Labs    12/26/19 2122 12/26/19 2137 12/27/19 0640 12/28/19 0604 12/29/19 0606 12/29/19 0606 01/10/20 0000 01/21/20 0000 01/28/20 0000 02/07/20 0000  WBC 15.4*  --  14.1* 12.1* 10.0   < > 14.3 9.2 7.2 7.1  NEUTROABS 13.4*  --   --   --   --   --  10,082.00  --  4,673.00  --   HGB 13.5   < > 13.2 12.5 12.2  --  13.6 12.7 12.0 11.0*  HCT 41.7   < >  41.3 39.9 39.3  --  42 40 37 35*  MCV 79.1*  --  80.8 80.8 81.5  --   --   --   --   --   PLT 297  --  256 245 233  --  523* 331 282 333   < > = values in this interval not displayed.   Lab Results  Component Value Date   TSH 3.509 12/29/2019   Lab Results  Component Value Date   HGBA1C 6.8 (H) 12/13/2019   Lab Results  Component Value Date   CHOL 109 12/13/2019   HDL 36 (L) 12/13/2019   LDLCALC 51 12/13/2019   TRIG 132 12/13/2019   CHOLHDL 3.0 12/13/2019    Significant Diagnostic Results in last 30 days:  No results found.  Assessment/Plan 1. Senile dementia with delirium with behavioral disturbance (HCC) Senile dementia with delirium with behavioral disturbance ([place]) Start on Depakote 125 mg BID from Mon Repeat heaptic panel and CBC in 4 weeks Discussed with the daughter Frequent falls Continues to be the issue Patient has a very poor awareness of her gait She usually falls when she tries to do her transfers Burst fracture of lumbar vertebra, sequela Doing well saw neurosurgery recently Walking with mild assist pain seems controlled Depression  with anxiety Continue on Zoloft and Seroquel Tachycardia  Continue on low-dose of Lopressor Primary hypertension Stable on Cozaar and metoprolol Mixed hyperlipidemia Continue on statin  Check CBC,Hepatic panel A1C lipid panel in 4 weeks      Family/ staff Communication:   Labs/tests ordered:   Total time spent in this patient care encounter was  45_  minutes; greater than 50% of the visit spent counseling patient and staff, reviewing records , Labs and coordinating care for problems addressed at this encounter.

## 2020-03-20 NOTE — Progress Notes (Deleted)
Senile dementia with delirium with behavioral disturbance ([place]) Start on Depakote 125 mg BID from Mon Repeat heaptic panel and CBC in 4 weeks Frequent falls  Burst fracture of lumbar vertebra, sequela  Depression with anxiety  Tachycardia   Primary hypertension  Mixed hyperlipidemia   Check CBC,Hepatic panel A1C lipi dpanel in 4 weeks

## 2020-03-21 ENCOUNTER — Encounter: Payer: Self-pay | Admitting: Internal Medicine

## 2020-03-21 ENCOUNTER — Non-Acute Institutional Stay (SKILLED_NURSING_FACILITY): Payer: Medicare Other | Admitting: Internal Medicine

## 2020-03-21 DIAGNOSIS — F0391 Unspecified dementia with behavioral disturbance: Secondary | ICD-10-CM | POA: Diagnosis not present

## 2020-03-21 DIAGNOSIS — E669 Obesity, unspecified: Secondary | ICD-10-CM

## 2020-03-21 DIAGNOSIS — W19XXXA Unspecified fall, initial encounter: Secondary | ICD-10-CM | POA: Diagnosis not present

## 2020-03-21 DIAGNOSIS — E1169 Type 2 diabetes mellitus with other specified complication: Secondary | ICD-10-CM | POA: Diagnosis not present

## 2020-03-21 DIAGNOSIS — M7989 Other specified soft tissue disorders: Secondary | ICD-10-CM | POA: Diagnosis not present

## 2020-03-21 DIAGNOSIS — I10 Essential (primary) hypertension: Secondary | ICD-10-CM | POA: Diagnosis not present

## 2020-03-21 DIAGNOSIS — R296 Repeated falls: Secondary | ICD-10-CM | POA: Diagnosis not present

## 2020-03-21 NOTE — Progress Notes (Signed)
Location:    [place] Room Number: 39 Place of Service:  SNF [phone]) Provider:  Veleta Miners MD  [name], MD  Patient Care Team: [name], MD as PCP - General (Internal Medicine)  Extended Emergency Contact Information Primary Emergency Contact: Harris,Margaret Address: [address]          [place], [place] [address] Lisa Castillo of [place] Phone: [phone] Mobile Phone: [phone] Relation: Daughter  Code Status:  DNR Goals of care: Advanced Directive information Advanced Directives 02/28/2020  Does Patient Have a Medical Advance Directive? Yes  Type of Advance Directive [place]  Does patient want to make changes to medical advance directive? No - Patient declined  Copy of [place] in Chart? Yes - validated most recent copy scanned in chart (See row information)  Would patient like information on creating a medical advance directive? -  Pre-existing out of facility DNR order (yellow form or pink MOST form) Yellow form placed in chart (order not valid for inpatient use)     Chief Complaint  Patient presents with  . Acute Visit    Fall with Pain in Right ankle    HPI:  Pt is a 87 y.o. female seen today for an acute visit for Pain and Swelling in Right Foot after fall   Patient was admitted in the hospital from 10/27-10/31 with L4 burst fracture after the fall Patient has a history of type 2 diabetes, hyperlipidemia, depression, dementia, hypothyroidism, osteopenia, stress and urge urinary incontinence [name] here in SNF for therapy  [place] in the facility Was trying to move the Rug in the activity room When asked patient she said she was out in [place] when she fell.    Other issues All other issues L4 fracture Doing well Pain controlled Walking with assist Parotid gland swelling US done in Facility did not show any abscess. Started on Augmentin for week.Swelling  resolved Tachycardia Was started on Lopressor EKG showed sinus rhythm Patient continues to have cognitive impairment issues.  Specially in the evening.  She is try to leave the facility called 911 many times.   Past Medical History:  Diagnosis Date  . Hearing loss   . History of fracture of clavicle   . Hypothyroidism   . Insomnia   . Tinnitus    Past Surgical History:  Procedure Laterality Date  . CHOLECYSTECTOMY  2007   Dr. Verdene Lennert  . CLAVICLE EXCISION  1986  . MOHS SURGERY     past 10 years chest & legs    Allergies  Allergen Reactions  . Metformin And Related Diarrhea    Allergies as of 03/21/2020      Reactions   Metformin And Related Diarrhea      Medication List       Accurate as of March 21, 2020 10:46 AM. If you have any questions, ask your nurse or doctor.        acetaminophen 325 MG tablet Commonly known as: TYLENOL Take 650 mg by mouth in the morning, at noon, and at bedtime.   atorvastatin 10 MG tablet Commonly known as: LIPITOR Take 10 mg by mouth daily.   Blood Glucose Monitoring Suppl Kit Use to test blood sugar once daily daily. Dx: E11.69   Boost Glucose Control Liqd Take by mouth. 1 can three times a day between meals   divalproex 125 MG DR tablet Commonly known as: DEPAKOTE Take 125 mg by mouth 2 (  two) times daily.   glimepiride 2 MG tablet Commonly known as: AMARYL Take 2 mg by mouth daily with breakfast.   glucose blood test strip Use to test blood sugar once daily. Dx: E11.69   Lancets 30G Misc Use to test blood sugar once daily. Dx: E11.69   Lancets Misc. Kit 1 Device by Does not apply route daily. Use as directed   levothyroxine 75 MCG tablet Commonly known as: SYNTHROID TAKE 1 TABLET (75 MCG TOTAL) BY MOUTH DAILY BEFORE BREAKFAST.   losartan 25 MG tablet Commonly known as: COZAAR Take 25 mg by mouth daily.   metoprolol tartrate 25 MG tablet Commonly known as: LOPRESSOR Take 12.5 mg by mouth 2 (two) times daily.    QUEtiapine 25 MG tablet Commonly known as: SEROQUEL Take 12.5 mg by mouth at bedtime.   sertraline 25 MG tablet Commonly known as: ZOLOFT Take 50 mg by mouth daily.   Vitamin D3 50 MCG (2000 UT) Tabs Take 1 tablet by mouth daily.   zinc oxide 20 % ointment Apply 1 application topically as needed for irritation.       Review of Systems  Review of Systems  Constitutional: Negative for activity change, appetite change, chills, diaphoresis, fatigue and fever.  HENT: Negative for mouth sores, postnasal drip, rhinorrhea, sinus pain and sore throat.   Respiratory: Negative for apnea, cough, chest tightness, shortness of breath and wheezing.   Cardiovascular: Negative for chest pain, palpitations and leg swelling.  Gastrointestinal: Negative for abdominal distention, abdominal pain, constipation, diarrhea, nausea and vomiting.  Genitourinary: Negative for dysuria and frequency.  Musculoskeletal: Negative for arthralgias, Skin: Negative for rash.  Neurological: Negative for dizziness, syncope, weakness, light-headedness and numbness.  Psychiatric/Behavioral: Positive for behavioral problems, confusion and sleep disturbance.     Immunization History  Administered Date(s) Administered  . DTaP 07/20/2013  . Influenza, High Dose Seasonal PF 12/07/2016  . Influenza,inj,Quad PF,6+ Mos 12/01/2017  . Influenza-Unspecified 11/20/2014, 12/12/2015, 11/20/2019  . Moderna Sars-Covid-2 Vaccination 03/05/2019, 04/02/2019  . PPD Test 08/06/2014  . Pneumococcal Conjugate-13 11/07/2013  . Pneumococcal Polysaccharide-23 03/22/2017  . Zoster 03/01/2006  . Zoster Recombinat (Shingrix) 06/07/2017, 08/26/2017   Pertinent  Health Maintenance Due  Topic Date Due  . OPHTHALMOLOGY EXAM  Never done  . HEMOGLOBIN A1C  06/12/2020  . FOOT EXAM  07/10/2020  . INFLUENZA VACCINE  Completed  . DEXA SCAN  Completed  . PNA vac Low Risk Adult  Completed   Fall Risk  12/19/2019 10/17/2019 08/22/2019 07/11/2019  06/13/2019  Falls in the past year? 0 0 0 0 0  Number falls in past yr: 0 0 0 0 0  Injury with Fall? - - 0 - -   Functional Status Survey:    Vitals:   03/21/20 1032  BP: (!) 144/76  Pulse: 90  Resp: 20  Temp: (!) 97.1 F (36.2 C)  SpO2: 97%  Weight: 140 lb 6.4 oz (63.7 kg)  Height: 5' (1.524 m)   Body mass index is 27.42 kg/m. Physical Exam Constitutional: Well-developed and well-nourished.  HENT:  Head: Normocephalic.  Mouth/Throat: Oropharynx is clear and moist.  Eyes: Pupils are equal, round, and reactive to light.  Neck: Neck supple.  Cardiovascular: Normal rate and normal heart sounds.  No murmur heard. Pulmonary/Chest: Effort normal and breath sounds normal. No respiratory distress. No wheezes. She has no rales.  Abdominal: Soft. Bowel sounds are normal. No distension. There is no tenderness. There is no rebound.  Musculoskeletal: Right Ankle is tender and Swollen  Lymphadenopathy: none Neurological: No Focal deficits Skin: Skin is warm and dry.  Psychiatric: Normal mood and affect. Behavior is normal. Thought content normal.   Labs reviewed: Recent Labs    12/13/19 0000 12/13/19 0000 12/26/19 2137 12/27/19 0410 01/10/20 0000 01/21/20 0000 01/28/20 0000 02/14/20 0000  NA 141  --  139 136   < > 141 143 143  K 4.3  --  3.6 4.5   < > 4.1 3.8 3.6  CL 104  --  101 103   < > 106 108 108  CO2 27  --   --  22   < > 27* 26* 26*  GLUCOSE 130*  --  169* 127*  --   --   --   --   BUN 16  --  16 17   < > '12 11 12  ' CREATININE 0.78   < > 0.60 0.74   < > 0.7 0.5 0.6  CALCIUM 9.0  --   --  8.8*   < > 8.9 8.7 8.7   < > = values in this interval not displayed.   Recent Labs    04/04/19 0928 08/16/19 0800 12/13/19 0000 01/10/20 0000  AST '19 14 15 18  ' ALT '25 14 11 17  ' ALKPHOS  --   --   --  124  BILITOT 0.5 0.4 0.4  --   PROT 6.4 6.4 6.5  --   ALBUMIN  --   --   --  3.5   Recent Labs    12/26/19 2122 12/26/19 2137 12/27/19 0640 12/28/19 0604 12/29/19 0606  12/29/19 0606 01/10/20 0000 01/21/20 0000 01/28/20 0000 02/07/20 0000  WBC 15.4*  --  14.1* 12.1* 10.0   < > 14.3 9.2 7.2 7.1  NEUTROABS 13.4*  --   --   --   --   --  10,082.00  --  4,673.00  --   HGB 13.5   < > 13.2 12.5 12.2  --  13.6 12.7 12.0 11.0*  HCT 41.7   < > 41.3 39.9 39.3  --  42 40 37 35*  MCV 79.1*  --  80.8 80.8 81.5  --   --   --   --   --   PLT 297  --  256 245 233  --  523* 331 282 333   < > = values in this interval not displayed.   Lab Results  Component Value Date   TSH 3.509 12/29/2019   Lab Results  Component Value Date   HGBA1C 6.8 (H) 12/13/2019   Lab Results  Component Value Date   CHOL 109 12/13/2019   HDL 36 (L) 12/13/2019   LDLCALC 51 12/13/2019   TRIG 132 12/13/2019   CHOLHDL 3.0 12/13/2019    Significant Diagnostic Results in last 30 days:  No results found.  Assessment/Plan Right Foot Swollen Xray of Foot Tylenol PRN for pain control . Fall Continue to be issues due to her poor awareness  Other issues  Senile dementia with delirium with behavioral disturbance ([place]) Start on Depakote 125 mg BID from Mon Repeat hepatic  panel and CBC in 4 weeks Discussed with the daughter Burst fracture of lumbar vertebra, sequela Doing well saw neurosurgery recently Walking with mild assist pain seems controlled Depression with anxiety Continue on Zoloft and Seroquel Tachycardia  Continue on low-dose of Lopressor Diabetes Doing well on Amaryl Primary hypertension Stable on Cozaar and metoprolol Mixed hyperlipidemia Continue on statin  Family/ staff Communication:  Labs/tests ordered:

## 2020-03-24 DIAGNOSIS — M25571 Pain in right ankle and joints of right foot: Secondary | ICD-10-CM | POA: Diagnosis not present

## 2020-04-01 DIAGNOSIS — S199XXA Unspecified injury of neck, initial encounter: Secondary | ICD-10-CM | POA: Diagnosis not present

## 2020-04-01 DIAGNOSIS — Y92128 Other place in nursing home as the place of occurrence of the external cause: Secondary | ICD-10-CM | POA: Diagnosis not present

## 2020-04-01 DIAGNOSIS — Z66 Do not resuscitate: Secondary | ICD-10-CM | POA: Diagnosis present

## 2020-04-01 DIAGNOSIS — F419 Anxiety disorder, unspecified: Secondary | ICD-10-CM | POA: Diagnosis present

## 2020-04-01 DIAGNOSIS — R220 Localized swelling, mass and lump, head: Secondary | ICD-10-CM | POA: Diagnosis not present

## 2020-04-01 DIAGNOSIS — Z79890 Hormone replacement therapy (postmenopausal): Secondary | ICD-10-CM | POA: Diagnosis not present

## 2020-04-01 DIAGNOSIS — S0081XA Abrasion of other part of head, initial encounter: Secondary | ICD-10-CM | POA: Diagnosis present

## 2020-04-01 DIAGNOSIS — F0150 Vascular dementia without behavioral disturbance: Secondary | ICD-10-CM | POA: Diagnosis not present

## 2020-04-01 DIAGNOSIS — M1612 Unilateral primary osteoarthritis, left hip: Secondary | ICD-10-CM | POA: Diagnosis not present

## 2020-04-01 DIAGNOSIS — M2578 Osteophyte, vertebrae: Secondary | ICD-10-CM | POA: Diagnosis not present

## 2020-04-01 DIAGNOSIS — E669 Obesity, unspecified: Secondary | ICD-10-CM | POA: Diagnosis not present

## 2020-04-01 DIAGNOSIS — Z888 Allergy status to other drugs, medicaments and biological substances status: Secondary | ICD-10-CM | POA: Diagnosis not present

## 2020-04-01 DIAGNOSIS — R339 Retention of urine, unspecified: Secondary | ICD-10-CM | POA: Diagnosis not present

## 2020-04-01 DIAGNOSIS — Z79899 Other long term (current) drug therapy: Secondary | ICD-10-CM | POA: Diagnosis not present

## 2020-04-01 DIAGNOSIS — F0391 Unspecified dementia with behavioral disturbance: Secondary | ICD-10-CM | POA: Diagnosis not present

## 2020-04-01 DIAGNOSIS — S52502A Unspecified fracture of the lower end of left radius, initial encounter for closed fracture: Secondary | ICD-10-CM | POA: Diagnosis present

## 2020-04-01 DIAGNOSIS — M25572 Pain in left ankle and joints of left foot: Secondary | ICD-10-CM | POA: Diagnosis not present

## 2020-04-01 DIAGNOSIS — F32A Depression, unspecified: Secondary | ICD-10-CM | POA: Diagnosis present

## 2020-04-01 DIAGNOSIS — Z20822 Contact with and (suspected) exposure to covid-19: Secondary | ICD-10-CM | POA: Diagnosis present

## 2020-04-01 DIAGNOSIS — M7989 Other specified soft tissue disorders: Secondary | ICD-10-CM | POA: Diagnosis not present

## 2020-04-01 DIAGNOSIS — E782 Mixed hyperlipidemia: Secondary | ICD-10-CM | POA: Diagnosis present

## 2020-04-01 DIAGNOSIS — S62102A Fracture of unspecified carpal bone, left wrist, initial encounter for closed fracture: Secondary | ICD-10-CM | POA: Diagnosis not present

## 2020-04-01 DIAGNOSIS — H9190 Unspecified hearing loss, unspecified ear: Secondary | ICD-10-CM | POA: Diagnosis present

## 2020-04-01 DIAGNOSIS — F418 Other specified anxiety disorders: Secondary | ICD-10-CM | POA: Diagnosis not present

## 2020-04-01 DIAGNOSIS — F0390 Unspecified dementia without behavioral disturbance: Secondary | ICD-10-CM | POA: Diagnosis present

## 2020-04-01 DIAGNOSIS — M19011 Primary osteoarthritis, right shoulder: Secondary | ICD-10-CM | POA: Diagnosis present

## 2020-04-01 DIAGNOSIS — R000 Tachycardia, unspecified: Secondary | ICD-10-CM | POA: Diagnosis not present

## 2020-04-01 DIAGNOSIS — Z23 Encounter for immunization: Secondary | ICD-10-CM | POA: Diagnosis not present

## 2020-04-01 DIAGNOSIS — I10 Essential (primary) hypertension: Secondary | ICD-10-CM | POA: Diagnosis present

## 2020-04-01 DIAGNOSIS — D62 Acute posthemorrhagic anemia: Secondary | ICD-10-CM | POA: Diagnosis not present

## 2020-04-01 DIAGNOSIS — S32001D Stable burst fracture of unspecified lumbar vertebra, subsequent encounter for fracture with routine healing: Secondary | ICD-10-CM | POA: Diagnosis not present

## 2020-04-01 DIAGNOSIS — S72002A Fracture of unspecified part of neck of left femur, initial encounter for closed fracture: Secondary | ICD-10-CM | POA: Diagnosis present

## 2020-04-01 DIAGNOSIS — S0993XA Unspecified injury of face, initial encounter: Secondary | ICD-10-CM | POA: Diagnosis not present

## 2020-04-01 DIAGNOSIS — G4700 Insomnia, unspecified: Secondary | ICD-10-CM | POA: Diagnosis present

## 2020-04-01 DIAGNOSIS — S52592A Other fractures of lower end of left radius, initial encounter for closed fracture: Secondary | ICD-10-CM | POA: Diagnosis not present

## 2020-04-01 DIAGNOSIS — S32001S Stable burst fracture of unspecified lumbar vertebra, sequela: Secondary | ICD-10-CM | POA: Diagnosis not present

## 2020-04-01 DIAGNOSIS — F0280 Dementia in other diseases classified elsewhere without behavioral disturbance: Secondary | ICD-10-CM | POA: Diagnosis not present

## 2020-04-01 DIAGNOSIS — M47812 Spondylosis without myelopathy or radiculopathy, cervical region: Secondary | ICD-10-CM | POA: Diagnosis not present

## 2020-04-01 DIAGNOSIS — F05 Delirium due to known physiological condition: Secondary | ICD-10-CM | POA: Diagnosis present

## 2020-04-01 DIAGNOSIS — Z7984 Long term (current) use of oral hypoglycemic drugs: Secondary | ICD-10-CM | POA: Diagnosis not present

## 2020-04-01 DIAGNOSIS — W101XXA Fall (on)(from) sidewalk curb, initial encounter: Secondary | ICD-10-CM | POA: Diagnosis present

## 2020-04-01 DIAGNOSIS — S72102A Unspecified trochanteric fracture of left femur, initial encounter for closed fracture: Secondary | ICD-10-CM | POA: Diagnosis not present

## 2020-04-01 DIAGNOSIS — N83202 Unspecified ovarian cyst, left side: Secondary | ICD-10-CM | POA: Diagnosis not present

## 2020-04-01 DIAGNOSIS — K5900 Constipation, unspecified: Secondary | ICD-10-CM | POA: Diagnosis not present

## 2020-04-01 DIAGNOSIS — S72142A Displaced intertrochanteric fracture of left femur, initial encounter for closed fracture: Secondary | ICD-10-CM | POA: Diagnosis present

## 2020-04-01 DIAGNOSIS — R296 Repeated falls: Secondary | ICD-10-CM | POA: Diagnosis not present

## 2020-04-01 DIAGNOSIS — S72002D Fracture of unspecified part of neck of left femur, subsequent encounter for closed fracture with routine healing: Secondary | ICD-10-CM | POA: Diagnosis not present

## 2020-04-01 DIAGNOSIS — E039 Hypothyroidism, unspecified: Secondary | ICD-10-CM | POA: Diagnosis present

## 2020-04-01 DIAGNOSIS — E119 Type 2 diabetes mellitus without complications: Secondary | ICD-10-CM | POA: Diagnosis present

## 2020-04-01 DIAGNOSIS — E1169 Type 2 diabetes mellitus with other specified complication: Secondary | ICD-10-CM | POA: Diagnosis not present

## 2020-04-01 DIAGNOSIS — M8580 Other specified disorders of bone density and structure, unspecified site: Secondary | ICD-10-CM | POA: Diagnosis present

## 2020-04-03 ENCOUNTER — Non-Acute Institutional Stay (SKILLED_NURSING_FACILITY): Payer: Medicare Other | Admitting: Internal Medicine

## 2020-04-03 DIAGNOSIS — E1169 Type 2 diabetes mellitus with other specified complication: Secondary | ICD-10-CM

## 2020-04-03 DIAGNOSIS — M7989 Other specified soft tissue disorders: Secondary | ICD-10-CM

## 2020-04-03 DIAGNOSIS — I10 Essential (primary) hypertension: Secondary | ICD-10-CM | POA: Diagnosis not present

## 2020-04-03 DIAGNOSIS — E669 Obesity, unspecified: Secondary | ICD-10-CM

## 2020-04-03 DIAGNOSIS — E782 Mixed hyperlipidemia: Secondary | ICD-10-CM | POA: Diagnosis not present

## 2020-04-03 DIAGNOSIS — R000 Tachycardia, unspecified: Secondary | ICD-10-CM

## 2020-04-03 DIAGNOSIS — R296 Repeated falls: Secondary | ICD-10-CM

## 2020-04-03 DIAGNOSIS — S32001S Stable burst fracture of unspecified lumbar vertebra, sequela: Secondary | ICD-10-CM

## 2020-04-03 DIAGNOSIS — F418 Other specified anxiety disorders: Secondary | ICD-10-CM

## 2020-04-03 DIAGNOSIS — F0391 Unspecified dementia with behavioral disturbance: Secondary | ICD-10-CM

## 2020-04-07 ENCOUNTER — Inpatient Hospital Stay (HOSPITAL_COMMUNITY)
Admission: EM | Admit: 2020-04-07 | Discharge: 2020-04-14 | DRG: 481 | Disposition: A | Discharge status: Skilled Nursing Facility | Payer: Medicare Other | Source: Skilled Nursing Facility | Attending: Internal Medicine | Admitting: Internal Medicine

## 2020-04-07 ENCOUNTER — Inpatient Hospital Stay (HOSPITAL_COMMUNITY): Payer: Medicare Other

## 2020-04-07 ENCOUNTER — Emergency Department (HOSPITAL_COMMUNITY): Payer: Medicare Other

## 2020-04-07 ENCOUNTER — Encounter (HOSPITAL_COMMUNITY): Payer: Self-pay | Admitting: Family Medicine

## 2020-04-07 ENCOUNTER — Encounter: Payer: Self-pay | Admitting: Internal Medicine

## 2020-04-07 ENCOUNTER — Other Ambulatory Visit: Payer: Self-pay

## 2020-04-07 DIAGNOSIS — W101XXA Fall (on)(from) sidewalk curb, initial encounter: Secondary | ICD-10-CM | POA: Diagnosis present

## 2020-04-07 DIAGNOSIS — M19011 Primary osteoarthritis, right shoulder: Secondary | ICD-10-CM | POA: Diagnosis present

## 2020-04-07 DIAGNOSIS — K5900 Constipation, unspecified: Secondary | ICD-10-CM | POA: Diagnosis not present

## 2020-04-07 DIAGNOSIS — M2578 Osteophyte, vertebrae: Secondary | ICD-10-CM | POA: Diagnosis not present

## 2020-04-07 DIAGNOSIS — Z79899 Other long term (current) drug therapy: Secondary | ICD-10-CM

## 2020-04-07 DIAGNOSIS — Y92128 Other place in nursing home as the place of occurrence of the external cause: Secondary | ICD-10-CM | POA: Diagnosis not present

## 2020-04-07 DIAGNOSIS — R339 Retention of urine, unspecified: Secondary | ICD-10-CM | POA: Diagnosis not present

## 2020-04-07 DIAGNOSIS — E782 Mixed hyperlipidemia: Secondary | ICD-10-CM | POA: Diagnosis present

## 2020-04-07 DIAGNOSIS — S199XXA Unspecified injury of neck, initial encounter: Secondary | ICD-10-CM | POA: Diagnosis not present

## 2020-04-07 DIAGNOSIS — R220 Localized swelling, mass and lump, head: Secondary | ICD-10-CM | POA: Diagnosis not present

## 2020-04-07 DIAGNOSIS — F03918 Unspecified dementia, unspecified severity, with other behavioral disturbance: Secondary | ICD-10-CM

## 2020-04-07 DIAGNOSIS — S0081XA Abrasion of other part of head, initial encounter: Secondary | ICD-10-CM | POA: Diagnosis present

## 2020-04-07 DIAGNOSIS — R0902 Hypoxemia: Secondary | ICD-10-CM | POA: Diagnosis not present

## 2020-04-07 DIAGNOSIS — F0390 Unspecified dementia without behavioral disturbance: Secondary | ICD-10-CM | POA: Diagnosis not present

## 2020-04-07 DIAGNOSIS — Y9210 Unspecified residential institution as the place of occurrence of the external cause: Secondary | ICD-10-CM

## 2020-04-07 DIAGNOSIS — I10 Essential (primary) hypertension: Secondary | ICD-10-CM | POA: Diagnosis present

## 2020-04-07 DIAGNOSIS — Z743 Need for continuous supervision: Secondary | ICD-10-CM | POA: Diagnosis not present

## 2020-04-07 DIAGNOSIS — S72142A Displaced intertrochanteric fracture of left femur, initial encounter for closed fracture: Secondary | ICD-10-CM | POA: Diagnosis not present

## 2020-04-07 DIAGNOSIS — M8580 Other specified disorders of bone density and structure, unspecified site: Secondary | ICD-10-CM | POA: Diagnosis present

## 2020-04-07 DIAGNOSIS — R52 Pain, unspecified: Secondary | ICD-10-CM

## 2020-04-07 DIAGNOSIS — F0280 Dementia in other diseases classified elsewhere without behavioral disturbance: Secondary | ICD-10-CM | POA: Diagnosis not present

## 2020-04-07 DIAGNOSIS — Z7984 Long term (current) use of oral hypoglycemic drugs: Secondary | ICD-10-CM | POA: Diagnosis not present

## 2020-04-07 DIAGNOSIS — D62 Acute posthemorrhagic anemia: Secondary | ICD-10-CM | POA: Diagnosis not present

## 2020-04-07 DIAGNOSIS — F419 Anxiety disorder, unspecified: Secondary | ICD-10-CM | POA: Diagnosis present

## 2020-04-07 DIAGNOSIS — E039 Hypothyroidism, unspecified: Secondary | ICD-10-CM | POA: Diagnosis not present

## 2020-04-07 DIAGNOSIS — Z66 Do not resuscitate: Secondary | ICD-10-CM | POA: Diagnosis not present

## 2020-04-07 DIAGNOSIS — Z419 Encounter for procedure for purposes other than remedying health state, unspecified: Secondary | ICD-10-CM

## 2020-04-07 DIAGNOSIS — S72102A Unspecified trochanteric fracture of left femur, initial encounter for closed fracture: Secondary | ICD-10-CM | POA: Diagnosis not present

## 2020-04-07 DIAGNOSIS — F05 Delirium due to known physiological condition: Secondary | ICD-10-CM | POA: Diagnosis not present

## 2020-04-07 DIAGNOSIS — M1612 Unilateral primary osteoarthritis, left hip: Secondary | ICD-10-CM | POA: Diagnosis not present

## 2020-04-07 DIAGNOSIS — R531 Weakness: Secondary | ICD-10-CM | POA: Diagnosis not present

## 2020-04-07 DIAGNOSIS — N83202 Unspecified ovarian cyst, left side: Secondary | ICD-10-CM | POA: Diagnosis not present

## 2020-04-07 DIAGNOSIS — S32001D Stable burst fracture of unspecified lumbar vertebra, subsequent encounter for fracture with routine healing: Secondary | ICD-10-CM | POA: Diagnosis not present

## 2020-04-07 DIAGNOSIS — S0993XA Unspecified injury of face, initial encounter: Secondary | ICD-10-CM | POA: Diagnosis not present

## 2020-04-07 DIAGNOSIS — E119 Type 2 diabetes mellitus without complications: Secondary | ICD-10-CM

## 2020-04-07 DIAGNOSIS — S72002A Fracture of unspecified part of neck of left femur, initial encounter for closed fracture: Secondary | ICD-10-CM

## 2020-04-07 DIAGNOSIS — S72142D Displaced intertrochanteric fracture of left femur, subsequent encounter for closed fracture with routine healing: Secondary | ICD-10-CM | POA: Diagnosis not present

## 2020-04-07 DIAGNOSIS — M25572 Pain in left ankle and joints of left foot: Secondary | ICD-10-CM | POA: Diagnosis not present

## 2020-04-07 DIAGNOSIS — F0391 Unspecified dementia with behavioral disturbance: Secondary | ICD-10-CM

## 2020-04-07 DIAGNOSIS — G4700 Insomnia, unspecified: Secondary | ICD-10-CM | POA: Diagnosis present

## 2020-04-07 DIAGNOSIS — S62102A Fracture of unspecified carpal bone, left wrist, initial encounter for closed fracture: Secondary | ICD-10-CM | POA: Diagnosis not present

## 2020-04-07 DIAGNOSIS — S52502A Unspecified fracture of the lower end of left radius, initial encounter for closed fracture: Secondary | ICD-10-CM | POA: Diagnosis present

## 2020-04-07 DIAGNOSIS — F418 Other specified anxiety disorders: Secondary | ICD-10-CM | POA: Diagnosis not present

## 2020-04-07 DIAGNOSIS — H9190 Unspecified hearing loss, unspecified ear: Secondary | ICD-10-CM | POA: Diagnosis present

## 2020-04-07 DIAGNOSIS — F32A Depression, unspecified: Secondary | ICD-10-CM | POA: Diagnosis present

## 2020-04-07 DIAGNOSIS — S52592A Other fractures of lower end of left radius, initial encounter for closed fracture: Secondary | ICD-10-CM | POA: Diagnosis not present

## 2020-04-07 DIAGNOSIS — S72002D Fracture of unspecified part of neck of left femur, subsequent encounter for closed fracture with routine healing: Secondary | ICD-10-CM | POA: Diagnosis not present

## 2020-04-07 DIAGNOSIS — W19XXXA Unspecified fall, initial encounter: Secondary | ICD-10-CM | POA: Diagnosis not present

## 2020-04-07 DIAGNOSIS — M47812 Spondylosis without myelopathy or radiculopathy, cervical region: Secondary | ICD-10-CM | POA: Diagnosis not present

## 2020-04-07 DIAGNOSIS — S52502D Unspecified fracture of the lower end of left radius, subsequent encounter for closed fracture with routine healing: Secondary | ICD-10-CM | POA: Diagnosis not present

## 2020-04-07 DIAGNOSIS — Z20822 Contact with and (suspected) exposure to covid-19: Secondary | ICD-10-CM | POA: Diagnosis not present

## 2020-04-07 DIAGNOSIS — R279 Unspecified lack of coordination: Secondary | ICD-10-CM | POA: Diagnosis not present

## 2020-04-07 DIAGNOSIS — Z79890 Hormone replacement therapy (postmenopausal): Secondary | ICD-10-CM | POA: Diagnosis not present

## 2020-04-07 DIAGNOSIS — F0150 Vascular dementia without behavioral disturbance: Secondary | ICD-10-CM | POA: Diagnosis not present

## 2020-04-07 DIAGNOSIS — Z8249 Family history of ischemic heart disease and other diseases of the circulatory system: Secondary | ICD-10-CM

## 2020-04-07 DIAGNOSIS — Z888 Allergy status to other drugs, medicaments and biological substances status: Secondary | ICD-10-CM

## 2020-04-07 HISTORY — DX: Fracture of unspecified part of neck of left femur, initial encounter for closed fracture: S72.002A

## 2020-04-07 HISTORY — DX: Unspecified fracture of the lower end of left radius, initial encounter for closed fracture: S52.502A

## 2020-04-07 HISTORY — DX: Unspecified residential institution as the place of occurrence of the external cause: W19.XXXA

## 2020-04-07 LAB — CBC WITH DIFFERENTIAL/PLATELET
Abs Immature Granulocytes: 0.27 10*3/uL — ABNORMAL HIGH (ref 0.00–0.07)
Basophils Absolute: 0.1 10*3/uL (ref 0.0–0.1)
Basophils Relative: 1 %
Eosinophils Absolute: 0.3 10*3/uL (ref 0.0–0.5)
Eosinophils Relative: 2 %
HCT: 35.1 % — ABNORMAL LOW (ref 36.0–46.0)
Hemoglobin: 11.3 g/dL — ABNORMAL LOW (ref 12.0–15.0)
Immature Granulocytes: 2 %
Lymphocytes Relative: 18 %
Lymphs Abs: 2.6 10*3/uL (ref 0.7–4.0)
MCH: 26.5 pg (ref 26.0–34.0)
MCHC: 32.2 g/dL (ref 30.0–36.0)
MCV: 82.2 fL (ref 80.0–100.0)
Monocytes Absolute: 0.9 10*3/uL (ref 0.1–1.0)
Monocytes Relative: 6 %
Neutro Abs: 10 10*3/uL — ABNORMAL HIGH (ref 1.7–7.7)
Neutrophils Relative %: 71 %
Platelets: 341 10*3/uL (ref 150–400)
RBC: 4.27 MIL/uL (ref 3.87–5.11)
RDW: 15.8 % — ABNORMAL HIGH (ref 11.5–15.5)
WBC: 14.1 10*3/uL — ABNORMAL HIGH (ref 4.0–10.5)
nRBC: 0 % (ref 0.0–0.2)

## 2020-04-07 LAB — BASIC METABOLIC PANEL WITH GFR
Anion gap: 14 (ref 5–15)
BUN: 27 mg/dL — ABNORMAL HIGH (ref 8–23)
CO2: 23 mmol/L (ref 22–32)
Calcium: 8.7 mg/dL — ABNORMAL LOW (ref 8.9–10.3)
Chloride: 102 mmol/L (ref 98–111)
Creatinine, Ser: 0.7 mg/dL (ref 0.44–1.00)
GFR, Estimated: >60 mL/min
Glucose, Bld: 147 mg/dL — ABNORMAL HIGH (ref 70–99)
Potassium: 4.1 mmol/L (ref 3.5–5.1)
Sodium: 139 mmol/L (ref 135–145)

## 2020-04-07 LAB — TYPE AND SCREEN
ABO/RH(D): A NEG
Antibody Screen: NEGATIVE

## 2020-04-07 LAB — PROTIME-INR
INR: 1 (ref 0.8–1.2)
Prothrombin Time: 13 seconds (ref 11.4–15.2)

## 2020-04-07 LAB — CBG MONITORING, ED: Glucose-Capillary: 170 mg/dL — ABNORMAL HIGH (ref 70–99)

## 2020-04-07 MED ORDER — HYDROMORPHONE HCL 1 MG/ML IJ SOLN
0.5000 mg | INTRAMUSCULAR | Status: DC | PRN
  Administered 2020-04-09: 0.5 mg via INTRAVENOUS
  Filled 2020-04-07: qty 0.5

## 2020-04-07 MED ORDER — LOSARTAN POTASSIUM 50 MG PO TABS
25.0000 mg | ORAL_TABLET | Freq: Every day | ORAL | Status: DC
  Administered 2020-04-08 – 2020-04-14 (×7): 25 mg via ORAL
  Filled 2020-04-07 (×7): qty 1

## 2020-04-07 MED ORDER — SERTRALINE HCL 50 MG PO TABS
50.0000 mg | ORAL_TABLET | Freq: Every day | ORAL | Status: DC
  Administered 2020-04-08 – 2020-04-14 (×7): 50 mg via ORAL
  Filled 2020-04-07 (×8): qty 1

## 2020-04-07 MED ORDER — HYDROCODONE-ACETAMINOPHEN 5-325 MG PO TABS: 1.0000 | ORAL_TABLET | Freq: Four times a day (QID) | ORAL | Status: DC | PRN

## 2020-04-07 MED ORDER — INSULIN ASPART 100 UNIT/ML ~~LOC~~ SOLN
0.0000 [IU] | Freq: Three times a day (TID) | SUBCUTANEOUS | Status: DC
  Administered 2020-04-08 – 2020-04-14 (×5): 1 [IU] via SUBCUTANEOUS

## 2020-04-07 MED ORDER — DIVALPROEX SODIUM 125 MG PO CSDR
125.0000 mg | DELAYED_RELEASE_CAPSULE | Freq: Two times a day (BID) | ORAL | Status: DC
Start: 2020-04-07 — End: 2020-04-10
  Administered 2020-04-09 (×3): 125 mg via ORAL
  Filled 2020-04-07 (×7): qty 1

## 2020-04-07 MED ORDER — LIDOCAINE HCL (PF) 1 % IJ SOLN
30.0000 mL | Freq: Once | INTRAMUSCULAR | Status: AC
  Administered 2020-04-07: 30 mL
  Filled 2020-04-07: qty 30

## 2020-04-07 MED ORDER — FENTANYL CITRATE (PF) 100 MCG/2ML IJ SOLN
50.0000 ug | Freq: Once | INTRAMUSCULAR | Status: AC
  Administered 2020-04-07: 50 ug via INTRAVENOUS
  Filled 2020-04-07: qty 2

## 2020-04-07 MED ORDER — ATORVASTATIN CALCIUM 10 MG PO TABS
10.0000 mg | ORAL_TABLET | Freq: Every day | ORAL | Status: DC
  Administered 2020-04-08 – 2020-04-14 (×7): 10 mg via ORAL
  Filled 2020-04-07 (×7): qty 1

## 2020-04-07 MED ORDER — ONDANSETRON HCL 4 MG/2ML IJ SOLN
4.0000 mg | Freq: Once | INTRAMUSCULAR | Status: AC
  Administered 2020-04-07: 4 mg via INTRAVENOUS
  Filled 2020-04-07: qty 2

## 2020-04-07 MED ORDER — LEVOTHYROXINE SODIUM 75 MCG PO TABS
75.0000 ug | ORAL_TABLET | Freq: Every day | ORAL | Status: DC
  Administered 2020-04-08 – 2020-04-14 (×5): 75 ug via ORAL
  Filled 2020-04-07 (×5): qty 1

## 2020-04-07 MED ORDER — METOPROLOL TARTRATE 12.5 MG HALF TABLET
12.5000 mg | ORAL_TABLET | Freq: Two times a day (BID) | ORAL | Status: DC
  Administered 2020-04-08 – 2020-04-14 (×13): 12.5 mg via ORAL
  Filled 2020-04-07 (×13): qty 1

## 2020-04-07 MED ORDER — LACTATED RINGERS IV SOLN
INTRAVENOUS | Status: DC
  Administered 2020-04-09: 75 mL/h via INTRAVENOUS

## 2020-04-07 MED ORDER — INSULIN ASPART 100 UNIT/ML ~~LOC~~ SOLN
0.0000 [IU] | Freq: Every day | SUBCUTANEOUS | Status: DC
  Administered 2020-04-09: 1 [IU] via SUBCUTANEOUS

## 2020-04-07 MED ORDER — SENNOSIDES-DOCUSATE SODIUM 8.6-50 MG PO TABS: 1.0000 | ORAL_TABLET | Freq: Every evening | ORAL | Status: DC | PRN

## 2020-04-07 MED ORDER — FENTANYL CITRATE (PF) 100 MCG/2ML IJ SOLN
50.0000 ug | INTRAMUSCULAR | Status: AC | PRN
  Administered 2020-04-07 – 2020-04-08 (×3): 50 ug via INTRAVENOUS
  Filled 2020-04-07 (×3): qty 2

## 2020-04-07 NOTE — H&P (Signed)
History and Physical    Jubilee Vivero KDT:[id number] DOB: 20-Sep-1928 DOA: 04/07/2020  PCP: [name], MD   Patient coming from: SNF  Chief Complaint: Fall with pain in left wrist and left hip.  Unable to stand up.  HPI: Lisa Castillo is a 87 y.o. female with medical history significant for HTN, hypothyroidism, dementia who presents by EMS after a fall at facility.  Daughter is at the bedside and provides history. The patient is apparently in a wheelchair from a previous spinal surgery but is able to ambulate at times.  She has an alarm on her wheelchair and somehow she went outside and attempted to ambulate. She stepped off of a curb and fell onto her left side.  She complains of severe pain in her left arm and leg.  She did hit her head and has a bruise and swelling of left forehead at eyebrow.  No reported loss of consciousness.   No report of chest pain or pressure.  Patient has not had any nausea, vomiting, diarrhea since the incident.  According to daughter she is at her baseline mental state.  She is up-to-date on her tetanus vaccination.  She was found to have a fractured left distal radius and left hip on x-rays.  Head CT and neck CT were negative for acute pathology.  ER provider discussed with on-call orthopedic surgery who will see patient in the morning for evaluation.  Left wrist fracture will be set and splinted in the emergency room.  Review of Systems:  Accurate review of system cannot be obtained secondary to dementia.  [name] complaining of left wrist and hip pain  Past Medical History:  Diagnosis Date  . Hearing loss   . History of fracture of clavicle   . Hypothyroidism   . Insomnia   . Tinnitus     Past Surgical History:  Procedure Laterality Date  . CHOLECYSTECTOMY  2007   Dr. Verdene Lennert  . CLAVICLE EXCISION  1986  . MOHS SURGERY     past 10 years chest & legs    Social History  reports that she has never smoked. She has never used smokeless tobacco.  She reports current alcohol use. She reports that she does not use drugs.  Allergies  Allergen Reactions  . Metformin And Related Diarrhea    Family History  Problem Relation Age of Onset  . Heart disease Mother   . Heart disease Father      Prior to Admission medications   Medication Sig Start Date End Date Taking? Authorizing Provider  acetaminophen (TYLENOL) 325 MG tablet Take 650 mg by mouth in the morning, at noon, and at bedtime.   Yes [provider]  atorvastatin (LIPITOR) 10 MG tablet Take 10 mg by mouth daily.   Yes [provider]  Blood Glucose Monitoring Suppl KIT Use to test blood sugar once daily daily. Dx: E11.69 05/25/19  Yes Mast, Man X, NP  Cholecalciferol (VITAMIN D3) 2000 units TABS Take 1 tablet by mouth daily.    Yes [provider]  divalproex (DEPAKOTE SPRINKLE) 125 MG capsule Take 125 mg by mouth 2 (two) times daily.   Yes [provider]  glimepiride (AMARYL) 2 MG tablet Take 2 mg by mouth daily with breakfast.   Yes [provider]  glucose blood test strip Use to test blood sugar once daily. Dx: E11.69 05/25/19  Yes Mast, Man X, NP  Lancets 30G MISC Use to test blood sugar once daily.  Dx: E11.69 05/25/19  Yes Mast, Man X, NP  Lancets Misc. KIT 1 Device by Does not apply route daily. Use as directed 05/24/19  Yes [name], MD  levothyroxine (SYNTHROID) 75 MCG tablet TAKE 1 TABLET (75 MCG TOTAL) BY MOUTH DAILY BEFORE BREAKFAST. 02/07/20  Yes [name], MD  losartan (COZAAR) 25 MG tablet Take 25 mg by mouth daily.   Yes [provider]  metoprolol tartrate (LOPRESSOR) 25 MG tablet Take 12.5 mg by mouth 2 (two) times daily.   Yes [provider]  Nutritional Supplements (BOOST GLUCOSE CONTROL) LIQD Take by mouth. 1 can three times a day between meals   Yes [provider]  QUEtiapine (SEROQUEL) 25 MG tablet Take 12.5 mg by mouth at bedtime.   Yes [provider]  sertraline  (ZOLOFT) 25 MG tablet Take 50 mg by mouth daily.    Yes [provider]  traMADol (ULTRAM) 50 MG tablet Take 25 mg by mouth every 6 (six) hours as needed for moderate pain.   Yes [provider]  zinc oxide 20 % ointment Apply 1 application topically as needed for irritation.   Yes [provider]    Physical Exam: Vitals:   04/07/20 1945 04/07/20 1951 04/07/20 2000 04/07/20 2115  BP:   (!) 169/79 (!) 158/70  Pulse: 86  83 97  Resp: '19  16 17  ' Temp:  97.6 F (36.4 C)    TempSrc:  Axillary    SpO2: 100%  100% 96%  Weight:      Height:        Constitutional: NAD, calm, comfortable Vitals:   04/07/20 1945 04/07/20 1951 04/07/20 2000 04/07/20 2115  BP:   (!) 169/79 (!) 158/70  Pulse: 86  83 97  Resp: '19  16 17  ' Temp:  97.6 F (36.4 C)    TempSrc:  Axillary    SpO2: 100%  100% 96%  Weight:      Height:       General: WDWN, Alert and oriented to self.  Eyes: EOMI, PERRL, conjunctivae normal.  Sclera nonicteric HENT:  [place].  Hematoma on left lateral eyebrow, external ears normal.  Nares patent without epistasis.  Mucous membranes are moist.  Neck: Soft, normal range of motion, supple, no masses, no thyromegaly.  Trachea midline Respiratory: clear to auscultation bilaterally, no wheezing, no crackles. Normal respiratory effort. No accessory muscle use.  Cardiovascular: Regular rate and rhythm, no murmurs / rubs / gallops. No extremity edema. 2+ pedal pulses Abdomen: Soft, no tenderness, nondistended, no rebound or guarding.  No masses palpated. Bowel sounds normoactive Musculoskeletal: Left distal radius with deformity, tender to palpation.  Left leg is shortened and externally rotated.  Tenderness palpation of left anterior and lateral hip.  no cyanosis. Normal muscle tone.  Skin: Warm, dry, intact no rashes, lesions, ulcers. No induration Neurologic: CN 2-12 grossly intact. Normal speech. Sensation intact Psychiatric:  Normal mood.    Labs on Admission:  I have personally reviewed following labs and imaging studies  CBC: Recent Labs  Lab 04/07/20 1921  WBC 14.1*  NEUTROABS 10.0*  HGB 11.3*  HCT 35.1*  MCV 82.2  PLT 482    Basic Metabolic Panel: Recent Labs  Lab 04/07/20 1921  NA 139  K 4.1  CL 102  CO2 23  GLUCOSE 147*  BUN 27*  CREATININE 0.70  CALCIUM 8.7*    GFR: Estimated Creatinine Clearance: 39.7 mL/min (by C-G formula based on SCr of 0.7  mg/dL).  Liver Function Tests: No results for input(s): AST, ALT, ALKPHOS, BILITOT, PROT, ALBUMIN in the last 168 hours.  Urine analysis: No results found for: COLORURINE, APPEARANCEUR, [place], [place], GLUCOSEU, HGBUR, BILIRUBINUR, KETONESUR, PROTEINUR, UROBILINOGEN, NITRITE, LEUKOCYTESUR  Radiological Exams on Admission: DG Chest 1 View  Result Date: 04/07/2020 CLINICAL DATA:  Found down, dementia EXAM: CHEST  1 VIEW COMPARISON:  None. FINDINGS: Single frontal view of the chest demonstrates an unremarkable cardiac silhouette. No airspace disease, effusion, or pneumothorax. No acute bony abnormalities. IMPRESSION: 1. No acute intrathoracic process. Electronically Signed   By: Randa Ngo M.D.   On: 04/07/2020 20:24   DG Wrist Complete Left  Result Date: 04/07/2020 CLINICAL DATA:  Pain EXAM: LEFT WRIST - COMPLETE 3+ VIEW COMPARISON:  None. FINDINGS: There is an acute, displaced and impacted fracture through the distal radius with significant dorsal angulation. There is surrounding soft tissue swelling. No frank dislocation. There is an old healed fracture of the fourth metacarpal. There are degenerative changes of the partially visualized interphalangeal joints. IMPRESSION: Acute, displaced, impacted fracture through the distal radius with significant dorsal angulation. Electronically Signed   By: Constance Holster M.D.   On: 04/07/2020 20:24   DG Ankle Complete Left  Result Date: 04/07/2020 CLINICAL DATA:  Pain EXAM: LEFT ANKLE COMPLETE - 3+ VIEW COMPARISON:  None. FINDINGS:  There is no evidence of fracture, dislocation, or joint effusion. There is no evidence of arthropathy or other focal bone abnormality. Soft tissues are unremarkable. IMPRESSION: Negative. Electronically Signed   By: Constance Holster M.D.   On: 04/07/2020 20:25   CT HEAD WO CONTRAST  Result Date: 04/07/2020 CLINICAL DATA:  Facial trauma EXAM: CT HEAD WITHOUT CONTRAST TECHNIQUE: Contiguous axial images were obtained from the base of the skull through the vertex without intravenous contrast. COMPARISON:  None. FINDINGS: Brain: No evidence of acute territorial infarction, hemorrhage, hydrocephalus,extra-axial collection or mass lesion/mass effect. There is dilatation the ventricles and sulci consistent with age-related atrophy. Low-attenuation changes in the deep white matter consistent with small vessel ischemia. Vascular: No hyperdense vessel or unexpected calcification. Skull: The skull is intact. No fracture or focal lesion identified. Mild soft tissue swelling seen over the left frontal skull. Sinuses/Orbits: The visualized paranasal sinuses and mastoid air cells are clear. The orbits and globes intact. Other: None Cervical spine: Alignment: Physiologic Skull base and vertebrae: Visualized skull base is intact. No atlanto-occipital dissociation. The vertebral body heights are well maintained. No fracture or pathologic osseous lesion seen. Soft tissues and spinal canal: The visualized paraspinal soft tissues are unremarkable. No prevertebral soft tissue swelling is seen. The spinal canal is grossly unremarkable, no large epidural collection or significant canal narrowing. Disc levels: Multilevel cervical spine spondylosis seen with disc osteophyte complex and uncovertebral osteophytes most notable at C4-C5 and C5-C6. Upper chest: The lung apices are clear. Thoracic inlet is within normal limits. Other: None IMPRESSION: No acute intracranial abnormality. Findings consistent with age related atrophy and chronic  small vessel ischemia No acute fracture or malalignment of the spine. Electronically Signed   By: Prudencio Pair M.D.   On: 04/07/2020 20:43   CT CERVICAL SPINE WO CONTRAST  Result Date: 04/07/2020 CLINICAL DATA:  Facial trauma EXAM: CT HEAD WITHOUT CONTRAST TECHNIQUE: Contiguous axial images were obtained from the base of the skull through the vertex without intravenous contrast. COMPARISON:  None. FINDINGS: Brain: No evidence of acute territorial infarction, hemorrhage, hydrocephalus,extra-axial collection or mass lesion/mass effect. There is dilatation the ventricles and sulci consistent with  age-related atrophy. Low-attenuation changes in the deep white matter consistent with small vessel ischemia. Vascular: No hyperdense vessel or unexpected calcification. Skull: The skull is intact. No fracture or focal lesion identified. Mild soft tissue swelling seen over the left frontal skull. Sinuses/Orbits: The visualized paranasal sinuses and mastoid air cells are clear. The orbits and globes intact. Other: None Cervical spine: Alignment: Physiologic Skull base and vertebrae: Visualized skull base is intact. No atlanto-occipital dissociation. The vertebral body heights are well maintained. No fracture or pathologic osseous lesion seen. Soft tissues and spinal canal: The visualized paraspinal soft tissues are unremarkable. No prevertebral soft tissue swelling is seen. The spinal canal is grossly unremarkable, no large epidural collection or significant canal narrowing. Disc levels: Multilevel cervical spine spondylosis seen with disc osteophyte complex and uncovertebral osteophytes most notable at C4-C5 and C5-C6. Upper chest: The lung apices are clear. Thoracic inlet is within normal limits. Other: None IMPRESSION: No acute intracranial abnormality. Findings consistent with age related atrophy and chronic small vessel ischemia No acute fracture or malalignment of the spine. Electronically Signed   By: Prudencio Pair M.D.    On: 04/07/2020 20:43   DG Hip Unilat With Pelvis 2-3 Views Left  Result Date: 04/07/2020 CLINICAL DATA:  Pain EXAM: DG HIP (WITH OR WITHOUT PELVIS) 2-3V LEFT COMPARISON:  None. FINDINGS: There is an acute, comminuted and displaced intratrochanteric fracture of the proximal left femur. There is no dislocation. There is osteopenia. There are moderate degenerative changes of both hips. IMPRESSION: Acute, comminuted and displaced intratrochanteric fracture of the proximal left femur. Electronically Signed   By: Constance Holster M.D.   On: 04/07/2020 20:24    EKG: Independently reviewed.  EKG shows normal sinus rhythm with nonspecific mild ST elevation in inferior leads.  QTc 445  Assessment/Plan Principal Problem:   Closed left hip fracture, initial encounter Ms. Leist is admitted to Hall floor.  Pain control with Dilaudid overnight. N.p.o. after midnight.  Patient be evaluated by orthopedic surgery in the morning to schedule definitive repair of hip fracture IV fluid hydration overnight with LR at 75 mL/hr  Active Problems:   HTN (hypertension) Continue home dose of losartan and metoprolol.  Monitor blood pressure    Unspecified fracture of the lower end of left radius, initial encounter for closed fracture ER provider is going to place a block in left wrist to set fracture in place splint.  Orthopedic will evaluate fracture in the morning for cast placement    Diabetes mellitus type 2 in nonobese Amaryl will be held while in the hospital.  Blood sugar be monitored every 6 hours while patient is n.p.o. and sliding scale insulin provided as needed for glycemic control. Check hemoglobin A1c.    Dementia  Chronic    Fall as cause of accidental injury in residential institution as place of occurrence      DVT prophylaxis: SCDs for DVT prophylaxis.   Code Status:   Full code  Family Communication:  Diagnosis and plan discussed with patient and her daughter who is at bedside.   Daughter verbalized understanding and agrees to plan.  Further recommendations to follow as clinically indicated Disposition Plan:   [name] from:  SNF  Anticipated DC to:  SNF  Anticipated DC date:  Anticipate more than 2 midnight stay in the hospital  Anticipated DC barriers: No barriers to discharge identified at this time  Consults called:  Orthopedic surgery consulted by ER provider and will see in am Admission status:  Inpatient  Yevonne Aline Lexie Koehl MD [facility]  How to contact the [facility] Attending or Consulting provider [place] or covering provider during after hours [place], for this patient?   1. Check the care team in [place] [place] and look for a) attending/consulting [facility] provider listed and b) the [facility] team listed 2. Log into [email or web address] and use [place]'s universal password to access. If you do not have the password, please contact the hospital operator. 3. Locate the [facility] provider you are looking for under [facility] and page to a number that you can be directly reached. 4. If you still have difficulty reaching the provider, please page the [facility] (Director on Call) for the Hospitalists listed on amion for assistance.  04/07/2020, 10:25 PM

## 2020-04-07 NOTE — ED Notes (Signed)
Pt daughter, Richardo Hanks, would like to be notified before surgery occurs and any updates on pts care. Cell number is [phone]

## 2020-04-07 NOTE — Progress Notes (Signed)
Location: [employer] features editor of Service:  SNF (31)  Provider:   Code Status:  Goals of Care:  Advanced Directives 02/28/2020  Does Patient Have a Medical Advance Directive? Yes  Type of Advance Directive [place]  Does patient want to make changes to medical advance directive? No - Patient declined  Copy of [place] in Chart? Yes - validated most recent copy scanned in chart (See row information)  Would patient like information on creating a medical advance directive? -  Pre-existing out of facility DNR order (yellow form or pink MOST form) Yellow form placed in chart (order not valid for inpatient use)     Chief Complaint  Patient presents with  . Acute Visit    HPI: [name] a 87 y.o. female seen today for an acute visit for Follow up of her Acute issues Patient was admitted in the hospital from 10/27-10/31 with L4 burst fracture after the fall Patient has a history of type 2 diabetes, hyperlipidemia, depression, dementia, hypothyroidism, osteopenia, stress and urge urinary incontinence [name] here in SNF for therapy  Acute issues Cognitive impairment Continues to be issue Better behaviors on Seroquel and Depakote Right Leg swelling Ankle swollen since her fall Xray negative for fracture Able to walk with therapy No pain Fall risk  continues to be the issue Daughter wants to know if she can go back to her apartment   Past Medical History:  Diagnosis Date  . Hearing loss   . History of fracture of clavicle   . Hypothyroidism   . Insomnia   . Tinnitus     Past Surgical History:  Procedure Laterality Date  . CHOLECYSTECTOMY  2007   Dr. Verdene Lennert  . CLAVICLE EXCISION  1986  . MOHS SURGERY     past 10 years chest & legs    Allergies  Allergen Reactions  . Metformin And Related Diarrhea    Outpatient Encounter Medications as of 04/03/2020  Medication Sig  . acetaminophen (TYLENOL) 325 MG tablet Take  650 mg by mouth in the morning, at noon, and at bedtime.  Marland Kitchen atorvastatin (LIPITOR) 10 MG tablet Take 10 mg by mouth daily.  . Blood Glucose Monitoring Suppl KIT Use to test blood sugar once daily daily. Dx: E11.69  . Cholecalciferol (VITAMIN D3) 2000 units TABS Take 1 tablet by mouth daily.   . divalproex (DEPAKOTE) 125 MG DR tablet Take 125 mg by mouth 2 (two) times daily.  Marland Kitchen glimepiride (AMARYL) 2 MG tablet Take 2 mg by mouth daily with breakfast.  . glucose blood test strip Use to test blood sugar once daily. Dx: E11.69  . Lancets 30G MISC Use to test blood sugar once daily. Dx: E11.69  . Lancets Misc. KIT 1 Device by Does not apply route daily. Use as directed  . levothyroxine (SYNTHROID) 75 MCG tablet TAKE 1 TABLET (75 MCG TOTAL) BY MOUTH DAILY BEFORE BREAKFAST.  Marland Kitchen losartan (COZAAR) 25 MG tablet Take 25 mg by mouth daily.  . metoprolol tartrate (LOPRESSOR) 25 MG tablet Take 12.5 mg by mouth 2 (two) times daily.  . Nutritional Supplements (BOOST GLUCOSE CONTROL) LIQD Take by mouth. 1 can three times a day between meals  . QUEtiapine (SEROQUEL) 25 MG tablet Take 12.5 mg by mouth at bedtime.  . sertraline (ZOLOFT) 25 MG tablet Take 50 mg by mouth daily.   Marland Kitchen zinc oxide 20 % ointment Apply 1 application topically as needed for irritation.  No facility-administered encounter medications on file as of 04/03/2020.    Review of Systems:  Review of Systems  Constitutional: Negative.   HENT: Negative.   Respiratory: Negative.   Cardiovascular: Negative.   Gastrointestinal: Negative.   Genitourinary: Negative.   Musculoskeletal: Positive for arthralgias, gait problem and myalgias.  Skin: Negative.   Neurological: Negative for dizziness.  Psychiatric/Behavioral: Positive for behavioral problems and confusion.    Health Maintenance  Topic Date Due  . OPHTHALMOLOGY EXAM  Never done  . COVID-19 Vaccine (3 - Booster for Moderna series) 09/30/2019  . TETANUS/TDAP  11/24/2026 (Originally  08/19/1947)  . HEMOGLOBIN A1C  06/12/2020  . FOOT EXAM  07/10/2020  . INFLUENZA VACCINE  Completed  . DEXA SCAN  Completed  . PNA vac Low Risk Adult  Completed    Physical Exam: Vitals:   04/07/20 0852  BP: 114/64  Pulse: 88  Resp: 18  Temp: (!) 97.3 F (36.3 C)   There is no height or weight on file to calculate BMI. Physical Exam Vitals reviewed.  Constitutional:      Appearance: Normal appearance.  HENT:     Head: Normocephalic.     Nose: Nose normal.     Mouth/Throat:     Mouth: Mucous membranes are moist.     Pharynx: Oropharynx is clear.  Eyes:     Pupils: Pupils are equal, round, and reactive to light.  Cardiovascular:     Rate and Rhythm: Normal rate and regular rhythm.     Pulses: Normal pulses.  Pulmonary:     Effort: Pulmonary effort is normal.     Breath sounds: Normal breath sounds.  Abdominal:     General: Abdomen is flat. Bowel sounds are normal.     Palpations: Abdomen is soft.  Musculoskeletal:     Cervical back: Neck supple.     Comments: Right Ankle Mild Swelling. No redness or pain Walking without pain  Skin:    General: Skin is warm.  Neurological:     General: No focal deficit present.     Mental Status: She is alert.  Psychiatric:        Mood and Affect: Mood normal.        Thought Content: Thought content normal.     Labs reviewed: Basic Metabolic Panel: Recent Labs    12/13/19 0000 12/13/19 0000 12/26/19 2137 12/27/19 0410 12/29/19 0606 01/10/20 0000 01/21/20 0000 01/28/20 0000 02/14/20 0000  NA 141  --  139 136  --    < > 141 143 143  K 4.3  --  3.6 4.5  --    < > 4.1 3.8 3.6  CL 104  --  101 103  --    < > 106 108 108  CO2 27  --   --  22  --    < > 27* 26* 26*  GLUCOSE 130*  --  169* 127*  --   --   --   --   --   BUN 16  --  16 17  --    < > '12 11 12  ' CREATININE 0.78   < > 0.60 0.74  --    < > 0.7 0.5 0.6  CALCIUM 9.0  --   --  8.8*  --    < > 8.9 8.7 8.7  TSH  --   --   --   --  3.509  --   --   --   --    < > =  values in this interval not displayed.   Liver Function Tests: Recent Labs    08/16/19 0800 12/13/19 0000 01/10/20 0000  AST '14 15 18  ' ALT '14 11 17  ' ALKPHOS  --   --  124  BILITOT 0.4 0.4  --   PROT 6.4 6.5  --   ALBUMIN  --   --  3.5   No results for input(s): LIPASE, AMYLASE in the last 8760 hours. No results for input(s): AMMONIA in the last 8760 hours. CBC: Recent Labs    12/26/19 2122 12/26/19 2137 12/27/19 0640 12/28/19 0604 12/29/19 0606 12/29/19 0606 01/10/20 0000 01/21/20 0000 01/28/20 0000 02/07/20 0000  WBC 15.4*  --  14.1* 12.1* 10.0   < > 14.3 9.2 7.2 7.1  NEUTROABS 13.4*  --   --   --   --   --  10,082.00  --  4,673.00  --   HGB 13.5   < > 13.2 12.5 12.2  --  13.6 12.7 12.0 11.0*  HCT 41.7   < > 41.3 39.9 39.3  --  42 40 37 35*  MCV 79.1*  --  80.8 80.8 81.5  --   --   --   --   --   PLT 297  --  256 245 233  --  523* 331 282 333   < > = values in this interval not displayed.   Lipid Panel: Recent Labs    12/13/19 0000  CHOL 109  HDL 36*  LDLCALC 51  TRIG 132  CHOLHDL 3.0   Lab Results  Component Value Date   HGBA1C 6.8 (H) 12/13/2019    Procedures since last visit: No results found.  Assessment/Plan 1. Swelling of right foot/ Sprain No Pain Xray negative Will try Brace per therapy   2. Dementia with behavioral disturbance, unspecified dementia type ([place]) Continues to be the major issue Discussed again with the family social worker and therapy She  cannot go back to her Apartment And per therapy she is not ready for AL as still needs help with her ADLS and stays Fall risk Daughter is deciding to keep her here in SNF Continue Depakote and Seroquel Did not tolerate Namenda Follow up Labs Pending  3. Diabetes mellitus type 2 in obese [facility]) Doing well on Amaryl Repeat A1C pending   4. Primary hypertension Doing well on Metoprolol and Cozaar  5. Frequent falls Working with therapy Doing better with her transfers but continue  to be risk due to her cognition and unaware of there risk of falls  6. Burst fracture of lumbar vertebra, sequela Pain Control on Tylenol  7. Depression with anxiety Continue Seroquel and Zoloft  8. Tachycardia Low dose of Metoprolol  9. Mixed hyperlipidemia On statin Lipid panel pending    Labs/tests ordered:  * No order type specified * Next appt:  Visit date not found  Total time spent in this patient care encounter was  45_  minutes; greater than 50% of the visit spent counseling patient and staff, reviewing records , Labs and coordinating care for problems addressed at this encounter.

## 2020-04-07 NOTE — ED Triage Notes (Signed)
BIB [facility] from [facility]. Pt was found outside on the curb after falling out of wheelchair. Fall was unwitnessed. Bruise noted to left eye, swelling noted to left wrist and pain to left leg/hip.  Hx: dementia, facility states she is at baseline mentally.

## 2020-04-07 NOTE — Progress Notes (Signed)
Orthopedic Tech Progress Note Patient Details:  Nadina Fomby [birth year] [id number]  Ortho Devices Type of Ortho Device: Sugartong splint Ortho Device/Splint Location: lue. splint applied post reduction with drs assist. Ortho Device/Splint Interventions: Ordered,Application,Adjustment   Post Interventions Patient Tolerated: Well Instructions Provided: Care of device,Adjustment of device   Karolee Stamps 04/07/2020, 11:31 PM

## 2020-04-07 NOTE — ED Provider Notes (Signed)
[facility] EMERGENCY DEPARTMENT Provider Note   CSN: [id number] Arrival date & time: 04/07/20  1900     History Chief Complaint  Patient presents with  . Fall    Lisa Castillo is a 87 y.o. female who presents to the ed with a cc of fall. [name] HOH with dementia and hx is provided predominantly by the patient's daughter who is at bedside.  The patient is apparently in a wheelchair from a previous spinal surgery but is able to ambulate at times.  He has an alarm on her wheelchair somehow she was found outside and stepped off of a curb and fell onto her left side.  She complains of severe pain in her left arm and leg.  She did hit her head and had obvious evidence of trauma.  She is up-to-date on her tetanus vaccination.  HPI     Past Medical History:  Diagnosis Date  . Hearing loss   . History of fracture of clavicle   . Hypothyroidism   . Insomnia   . Tinnitus     Patient Active Problem List   Diagnosis Date Noted  . Frequent falls 02/25/2020  . HTN (hypertension) 02/04/2020  . Hypokalemia 01/21/2020  . Rectal bleed 01/18/2020  . Right lower lobe pneumonia 01/14/2020  . Depression with anxiety 12/31/2019  . Left tubo-ovarian mass 12/31/2019  . Slow transit constipation 12/31/2019  . Burst fracture of lumbar vertebra, sequela 12/27/2019  . Senile dementia, delirium ([place]) 04/03/2019  . Tachycardia 04/03/2019  . Osteopenia 06/14/2018  . Diabetes mellitus type 2 in obese ([place]) 11/09/2017  . Obesity (BMI 30-39.9) 01/19/2017  . Mixed hyperlipidemia 06/29/2016  . Gait abnormality 04/27/2016  . Hearing loss   . Hypothyroidism   . Insomnia     Past Surgical History:  Procedure Laterality Date  . CHOLECYSTECTOMY  2007   Dr. Verdene Lennert  . CLAVICLE EXCISION  1986  . MOHS SURGERY     past 10 years chest & legs     OB History   No obstetric history on file.     Family History  Problem Relation Age of Onset  . Heart disease Mother   . Heart  disease Father     Social History   Tobacco Use  . Smoking status: Never Smoker  . Smokeless tobacco: Never Used  Vaping Use  . Vaping Use: Never used  Substance Use Topics  . Alcohol use: Yes    Comment: 4 per week  . Drug use: No    Home Medications Prior to Admission medications   Medication Sig Start Date End Date Taking? Authorizing Provider  acetaminophen (TYLENOL) 325 MG tablet Take 650 mg by mouth in the morning, at noon, and at bedtime.    [provider]  atorvastatin (LIPITOR) 10 MG tablet Take 10 mg by mouth daily.    [provider]  Blood Glucose Monitoring Suppl KIT Use to test blood sugar once daily daily. Dx: E11.69 05/25/19   Mast, Man X, NP  Cholecalciferol (VITAMIN D3) 2000 units TABS Take 1 tablet by mouth daily.     [provider]  divalproex (DEPAKOTE) 125 MG DR tablet Take 125 mg by mouth 2 (two) times daily.    [provider]  glimepiride (AMARYL) 2 MG tablet Take 2 mg by mouth daily with breakfast.    [provider]  glucose blood test strip Use to test blood sugar once daily. Dx: E11.69 05/25/19   Mast, Man  X, NP  Lancets 30G MISC Use to test blood sugar once daily. Dx: E11.69 05/25/19   Mast, Man X, NP  Lancets Misc. KIT 1 Device by Does not apply route daily. Use as directed 05/24/19   [name], MD  levothyroxine (SYNTHROID) 75 MCG tablet TAKE 1 TABLET (75 MCG TOTAL) BY MOUTH DAILY BEFORE BREAKFAST. 02/07/20   [name], MD  losartan (COZAAR) 25 MG tablet Take 25 mg by mouth daily.    [provider]  metoprolol tartrate (LOPRESSOR) 25 MG tablet Take 12.5 mg by mouth 2 (two) times daily.    [provider]  Nutritional Supplements (BOOST GLUCOSE CONTROL) LIQD Take by mouth. 1 can three times a day between meals    [provider]  QUEtiapine (SEROQUEL) 25 MG tablet Take 12.5 mg by mouth at bedtime.    [provider]  sertraline (ZOLOFT) 25 MG tablet Take 50 mg by  mouth daily.     [provider]  zinc oxide 20 % ointment Apply 1 application topically as needed for irritation.    [provider]    Allergies    Metformin and related  Review of Systems   Review of Systems Ten systems reviewed and are negative for acute change, except as noted in the HPI.   Physical Exam Updated Vital Signs Ht 5' (1.524 m)   Wt 63.5 kg   BMI 27.34 kg/m   Physical Exam Vitals and nursing note reviewed.  Constitutional:      General: She is not in acute distress.    Appearance: She is well-developed and well-nourished. She is not diaphoretic.  HENT:     Head: Normocephalic.     Comments: Abrasions to BL forehead Eyes:     General: No scleral icterus.    Conjunctiva/sclera: Conjunctivae normal.  Cardiovascular:     Rate and Rhythm: Normal rate and regular rhythm.     Heart sounds: Normal heart sounds. No murmur heard. No friction rub. No gallop.   Pulmonary:     Effort: Pulmonary effort is normal. No respiratory distress.     Breath sounds: Normal breath sounds.  Abdominal:     General: Bowel sounds are normal. There is no distension.     Palpations: Abdomen is soft. There is no mass.     Tenderness: There is no abdominal tenderness. There is no guarding.  Musculoskeletal:     Cervical back: Normal range of motion.     Comments: Right Wrist deformity  Pain if RIght shoulder, Large bruise Left leg shortened and externally rotated Mild pain to palpation of the left lateral malloeolus Normal pulse  Skin:    General: Skin is warm and dry.  Neurological:     Mental Status: She is alert and oriented to person, place, and time.  Psychiatric:        Behavior: Behavior normal.     ED Results / Procedures / Treatments   Labs (all labs ordered are listed, but only abnormal results are displayed) Labs Reviewed  SARS CORONAVIRUS 2 (TAT 6-24 HRS)  BASIC METABOLIC PANEL  CBC WITH DIFFERENTIAL/PLATELET  PROTIME-INR  TYPE AND SCREEN     EKG None  Radiology No results found.  Procedures Reduction of fracture  Date/Time: 04/07/2020 10:53 PM Performed by: Margarita Mail, PA-C Authorized by: Margarita Mail, PA-C  Consent: Verbal consent obtained. Consent given by: patient and guardian Patient identity confirmed: arm band Time out: Immediately prior to procedure a "time out" was called  to verify the correct patient, procedure, equipment, support staff and site/side marked as required. Preparation: Patient was prepped and draped in the usual sterile fashion. Local anesthesia used: yes Anesthesia: hematoma block  Anesthesia: Local anesthesia used: yes Local Anesthetic: lidocaine 1% without epinephrine Anesthetic total: 6 mL  Sedation: Patient sedated: no  Patient tolerance: patient tolerated the procedure well with no immediate complications  .Splint Application  Date/Time: 04/07/2020 10:54 PM Performed by: Margarita Mail, PA-C Authorized by: Margarita Mail, PA-C   Consent:    Consent obtained:  Verbal   Consent given by:  Patient   Risks discussed:  Discoloration, numbness, pain and swelling   Alternatives discussed:  No treatment Universal protocol:    Procedure explained and questions answered to patient or proxy's satisfaction: yes     Imaging studies available: yes     Site/side marked: yes     Immediately prior to procedure a time out was called: yes     Patient identity confirmed:  Arm band Pre-procedure details:    Distal neurologic exam:  Normal   Distal perfusion: distal pulses strong   Procedure details:    Location:  Wrist   Wrist location:  L wrist   Splint type:  Sugar tong   Supplies:  Fiberglass   Attestation: Splint applied and adjusted personally by me   Post-procedure details:    Distal neurologic exam:  Normal   Distal perfusion: distal pulses strong     Procedure completion:  Tolerated well, no immediate complications   Post-procedure imaging: reviewed       Medications Ordered in ED Medications - No data to display  ED Course  I have reviewed the triage vital signs and the nursing notes.  Pertinent labs & imaging results that were available during my care of the patient were reviewed by me and considered in my medical decision making (see chart for details).    MDM Rules/Calculators/A&P                          87 year old female here with mechanical fall.  I ordered interpreted and reviewed labs which include a CBC which shows elevated white blood cell count likely acute phase reaction, BMP without significant abnormalities.  cbg of 170.  PT/INR within normal limits, type and screen shows a negative blood.  Covid test pending I ordered and reviewed images which includes CT head and C-spine which showed no acute abnormalities.  I ordered a plain film of the left ankle, left hip, left wrist.  The left wrist and left hip show acute fractures including a dorsally displaced distal radial fracture and a left intratrochanteric hip fracture.  The left ankle shows no obvious fracture.  Case discussed with Dr. Erlinda Hong who asks that I do a hematoma block and try to splint the wrist.  After I blocked the wrist and attempted reduction placed the left wrist in a sugar tong splint with your help of the [place].  Pete imaging obtained shows very little change in alignment of the fracture however patient is neurovascularly intact.  Patient will be admitted by the hospitalist service.  Katrese Shell was evaluated in Emergency Department on 04/07/2020 for the symptoms described in the history of present illness. She was evaluated in the context of the global COVID-19 pandemic, which necessitated consideration that the patient might be at risk for infection with the SARS-CoV-2 virus that causes COVID-19. Institutional protocols and algorithms that pertain to the evaluation of  patients at risk for COVID-19 are in a state of rapid change based on information released by  regulatory bodies including the CDC and federal and state organizations. These policies and algorithms were followed during the patient's care in the ED.  Final Clinical Impression(s) / ED Diagnoses Final diagnoses:  Closed fracture of left hip, initial encounter [facility])  Closed fracture of left wrist, initial encounter    Rx / DC Orders ED Discharge Orders    None       Margarita Mail, PA-C 04/07/20 2349    Quintella Reichert, MD 04/10/20 [phone]

## 2020-04-08 LAB — BASIC METABOLIC PANEL
Anion gap: 10 (ref 5–15)
BUN: 20 mg/dL (ref 8–23)
CO2: 24 mmol/L (ref 22–32)
Calcium: 8.7 mg/dL — ABNORMAL LOW (ref 8.9–10.3)
Chloride: 105 mmol/L (ref 98–111)
Creatinine, Ser: 0.58 mg/dL (ref 0.44–1.00)
GFR, Estimated: >60 mL/min (ref >60)
Glucose, Bld: 171 mg/dL — ABNORMAL HIGH (ref 70–99)
Potassium: 4.5 mmol/L (ref 3.5–5.1)
Sodium: 139 mmol/L (ref 135–145)

## 2020-04-08 LAB — CBC
HCT: 34 % — ABNORMAL LOW (ref 36.0–46.0)
Hemoglobin: 10.6 g/dL — ABNORMAL LOW (ref 12.0–15.0)
MCH: 26.1 pg (ref 26.0–34.0)
MCHC: 31.2 g/dL (ref 30.0–36.0)
MCV: 83.7 fL (ref 80.0–100.0)
Platelets: 320 10*3/uL (ref 150–400)
RBC: 4.06 MIL/uL (ref 3.87–5.11)
RDW: 15.4 % (ref 11.5–15.5)
WBC: 12.8 10*3/uL — ABNORMAL HIGH (ref 4.0–10.5)
nRBC: 0 % (ref 0.0–0.2)

## 2020-04-08 LAB — CBG MONITORING, ED
Glucose-Capillary: 131 mg/dL — ABNORMAL HIGH (ref 70–99)
Glucose-Capillary: 124 mg/dL — ABNORMAL HIGH (ref 70–99)
Glucose-Capillary: 111 mg/dL — ABNORMAL HIGH (ref 70–99)

## 2020-04-08 LAB — HEMOGLOBIN A1C
Hgb A1c MFr Bld: 5.5 % (ref 4.8–5.6)
Mean Plasma Glucose: 111.15 mg/dL

## 2020-04-08 LAB — SARS CORONAVIRUS 2 (TAT 6-24 HRS): SARS Coronavirus 2: NEGATIVE

## 2020-04-08 LAB — ABO/RH: ABO/RH(D): A NEG

## 2020-04-08 MED ORDER — QUETIAPINE FUMARATE 25 MG PO TABS
12.5000 mg | ORAL_TABLET | Freq: Every day | ORAL | Status: DC
  Administered 2020-04-09 (×2): 12.5 mg via ORAL
  Filled 2020-04-08 (×2): qty 1

## 2020-04-08 MED ORDER — ONDANSETRON HCL 4 MG/2ML IJ SOLN
4.0000 mg | Freq: Four times a day (QID) | INTRAMUSCULAR | Status: DC | PRN
  Administered 2020-04-08: 4 mg via INTRAVENOUS
  Filled 2020-04-08: qty 2

## 2020-04-08 MED ORDER — ACETAMINOPHEN 325 MG PO TABS: 650.0000 mg | ORAL_TABLET | ORAL | Status: DC | PRN

## 2020-04-08 NOTE — Progress Notes (Signed)
[facility]                                                                              Patient Demographics  Lisa Castillo, is a 87 y.o. female, DOB - August 17, 1928, WEX:[id number]  Admit date - 04/07/2020   Admitting Physician Eben Burow, MD  Outpatient Primary MD for the patient is [name], MD  Outpatient specialists:   LOS - 1  days   Medical records reviewed and are as summarized below:    Chief Complaint  Patient presents with  . Fall       Brief summary   [name] a 87 year old female with history of HTN, hypothyroidism, dementia who presents by EMS after a fall at facility.   The patient is apparently in a wheelchair from a previous spinal surgery but is able to ambulate at times.  Patient has an alarm on her wheelchair and somehow she went outside and attempted to ambulate.  Patient stepped off of a curb and fell onto her left side.  She did hit her head, no report of loss of consciousness. Patient was found to have left distal radius fracture and left hip fracture on the x-rays.  Head CT and neck CT were negative for any acute fracture Orthopedics consulted. Left wrist fracture was splinted in ED.   Assessment & Plan     Principal Problem:   Closed left hip fracture, initial encounter ([place]) -Continue n.p.o. status, IV fluids, pain control -Awaiting orthopedics evaluation for possible surgery today -Orthopedics has been consulted.  Active Problems:   HTN (hypertension) -BP currently stable, continue losartan, metoprolol     Unspecified fracture of the lower end of left radius -Currently in a splint.  Will follow orthopedics recommendations.    Diabetes mellitus type 2 in nonobese (HCC), NIDDM -Hold Amaryl, continue SSI -Follow hemoglobin A1c     Dementia (HCC) -Currently alert and oriented, no acute issues -Continue Depakote, Zoloft  Mechanical fall as cause of accidental injury in residential institution as place of  occurrence   -PT OT evaluation after the surgery, patient is currently in a facility  Hypothyroidism -Continue Synthroid   Code Status: DNR status DVT Prophylaxis:  SCDs Start: 04/07/20 2254   Level of Care: Level of care: Med-Surg Family Communication: Discussed all imaging results, lab results, explained to the patient   Disposition Plan:     Status is: Inpatient  Remains inpatient appropriate because:Inpatient level of care appropriate due to severity of illness   Dispo: The patient is from: SNF              Anticipated d/c is to: SNF              Anticipated d/c date is: > 3 days              Patient currently is not medically stable to d/c.   Difficult to place patient No      Time Spent in minutes 35 minutes  Procedures:  None  Consultants:   Orthopedics  Antimicrobials:   Anti-infectives (From admission, onward)   None  Medications  Scheduled Meds: . atorvastatin  10 mg Oral Daily  . divalproex  125 mg Oral BID  . insulin aspart  0-5 Units Subcutaneous QHS  . insulin aspart  0-9 Units Subcutaneous TID WC  . levothyroxine  75 mcg Oral QAC breakfast  . losartan  25 mg Oral Daily  . metoprolol tartrate  12.5 mg Oral BID  . sertraline  50 mg Oral Daily   Continuous Infusions: . lactated ringers 75 mL/hr at 04/07/20 2327   PRN Meds:.fentaNYL (SUBLIMAZE) injection, HYDROcodone-acetaminophen, HYDROmorphone (DILAUDID) injection, senna-docusate      Subjective:   Lisa Castillo was seen and examined today.  Currently comfortable, seen in the ED awaiting Ortho evaluation.  Left wrist has been splinted. Patient denies dizziness, chest pain, shortness of breath, abdominal pain, N/V.  No fevers  Objective:   Vitals:   04/08/20 0300 04/08/20 0315 04/08/20 0351 04/08/20 0637  BP: (!) 156/71 (!) 144/74 (!) 147/75 (!) 143/76  Pulse: 100 100 (!) 110 96  Resp: 18 18 11  (!) 21  Temp:      TempSrc:      SpO2: 96% 94% 95% 97%  Weight:       Height:       No intake or output data in the 24 hours ending 04/08/20 0805   Wt Readings from Last 3 Encounters:  04/07/20 63.5 kg  03/21/20 63.7 kg  03/20/20 63.7 kg     Exam  General: Alert and oriented, NAD  Cardiovascular: S1 S2 auscultated, no murmurs, RRR  Respiratory: Clear to auscultation bilaterally, no wheezing, rales or rhonchi  Gastrointestinal: Soft, nontender, nondistended, + bowel sounds  Ext: no pedal edema bilaterally.  Left wrist in splint  Neuro: no acute FND's  Musculoskeletal: No digital cyanosis, clubbing  Skin: No rashes  Psych: Normal affect and demeanor   Data Reviewed:  I have personally reviewed following labs and imaging studies  Micro Results Recent Results (from the past 240 hour(s))  SARS CORONAVIRUS 2 (TAT 6-24 HRS) Nasopharyngeal Nasopharyngeal Swab     Status: None   Collection Time: 04/07/20  7:34 PM   Specimen: Nasopharyngeal Swab  Result Value Ref Range Status   SARS Coronavirus 2 NEGATIVE NEGATIVE Final    Comment: (NOTE) SARS-CoV-2 target nucleic acids are NOT DETECTED.  The SARS-CoV-2 RNA is generally detectable in upper and lower respiratory specimens during the acute phase of infection. Negative results do not preclude SARS-CoV-2 infection, do not rule out co-infections with other pathogens, and should not be used as the sole basis for treatment or other patient management decisions. Negative results must be combined with clinical observations, patient history, and epidemiological information. The expected result is Negative.  Fact Sheet for Patients: SugarRoll.be  Fact Sheet for Healthcare Providers: https://www.woods-mathews.com/  This test is not yet approved or cleared by the [place] FDA and  has been authorized for detection and/or diagnosis of SARS-CoV-2 by FDA under an Emergency Use Authorization (EUA). This EUA will remain  in effect (meaning this test can  be used) for the duration of the COVID-19 declaration under Se ction 564(b)(1) of the Act, 21 U.S.C. section 360bbb-3(b)(1), unless the authorization is terminated or revoked sooner.  Performed at [facility], [place] [address] [place], [place] [address]     Radiology Reports DG Chest 1 View  Result Date: 04/07/2020 CLINICAL DATA:  Found down, dementia EXAM: CHEST  1 VIEW COMPARISON:  None. FINDINGS: Single frontal view of the chest demonstrates an unremarkable cardiac  silhouette. No airspace disease, effusion, or pneumothorax. No acute bony abnormalities. IMPRESSION: 1. No acute intrathoracic process. Electronically Signed   By: Randa Ngo M.D.   On: 04/07/2020 20:24   DG Wrist Complete Left  Result Date: 04/07/2020 CLINICAL DATA:  Post reduction film of known left wrist fracture. EXAM: LEFT WRIST - COMPLETE 3+ VIEW COMPARISON:  None. FINDINGS: Splinting material obscures fine bony and soft tissue detail. Interval splinting with similar alignment of the displaced, impacted dorsally angulated fracture through the distal radius. IMPRESSION: Interval splinting with similar alignment of the distal radial fracture. Electronically Signed   By: Dahlia Bailiff MD   On: 04/07/2020 23:11   DG Wrist Complete Left  Result Date: 04/07/2020 CLINICAL DATA:  Pain EXAM: LEFT WRIST - COMPLETE 3+ VIEW COMPARISON:  None. FINDINGS: There is an acute, displaced and impacted fracture through the distal radius with significant dorsal angulation. There is surrounding soft tissue swelling. No frank dislocation. There is an old healed fracture of the fourth metacarpal. There are degenerative changes of the partially visualized interphalangeal joints. IMPRESSION: Acute, displaced, impacted fracture through the distal radius with significant dorsal angulation. Electronically Signed   By: Constance Holster M.D.   On: 04/07/2020 20:24   DG Ankle Complete Left  Result Date: 04/07/2020 CLINICAL DATA:  Pain EXAM: LEFT  ANKLE COMPLETE - 3+ VIEW COMPARISON:  None. FINDINGS: There is no evidence of fracture, dislocation, or joint effusion. There is no evidence of arthropathy or other focal bone abnormality. Soft tissues are unremarkable. IMPRESSION: Negative. Electronically Signed   By: Constance Holster M.D.   On: 04/07/2020 20:25   CT HEAD WO CONTRAST  Result Date: 04/07/2020 CLINICAL DATA:  Facial trauma EXAM: CT HEAD WITHOUT CONTRAST TECHNIQUE: Contiguous axial images were obtained from the base of the skull through the vertex without intravenous contrast. COMPARISON:  None. FINDINGS: Brain: No evidence of acute territorial infarction, hemorrhage, hydrocephalus,extra-axial collection or mass lesion/mass effect. There is dilatation the ventricles and sulci consistent with age-related atrophy. Low-attenuation changes in the deep white matter consistent with small vessel ischemia. Vascular: No hyperdense vessel or unexpected calcification. Skull: The skull is intact. No fracture or focal lesion identified. Mild soft tissue swelling seen over the left frontal skull. Sinuses/Orbits: The visualized paranasal sinuses and mastoid air cells are clear. The orbits and globes intact. Other: None Cervical spine: Alignment: Physiologic Skull base and vertebrae: Visualized skull base is intact. No atlanto-occipital dissociation. The vertebral body heights are well maintained. No fracture or pathologic osseous lesion seen. Soft tissues and spinal canal: The visualized paraspinal soft tissues are unremarkable. No prevertebral soft tissue swelling is seen. The spinal canal is grossly unremarkable, no large epidural collection or significant canal narrowing. Disc levels: Multilevel cervical spine spondylosis seen with disc osteophyte complex and uncovertebral osteophytes most notable at C4-C5 and C5-C6. Upper chest: The lung apices are clear. Thoracic inlet is within normal limits. Other: None IMPRESSION: No acute intracranial abnormality.  Findings consistent with age related atrophy and chronic small vessel ischemia No acute fracture or malalignment of the spine. Electronically Signed   By: Prudencio Pair M.D.   On: 04/07/2020 20:43   CT CERVICAL SPINE WO CONTRAST  Result Date: 04/07/2020 CLINICAL DATA:  Facial trauma EXAM: CT HEAD WITHOUT CONTRAST TECHNIQUE: Contiguous axial images were obtained from the base of the skull through the vertex without intravenous contrast. COMPARISON:  None. FINDINGS: Brain: No evidence of acute territorial infarction, hemorrhage, hydrocephalus,extra-axial collection or mass lesion/mass effect. There is dilatation the  ventricles and sulci consistent with age-related atrophy. Low-attenuation changes in the deep white matter consistent with small vessel ischemia. Vascular: No hyperdense vessel or unexpected calcification. Skull: The skull is intact. No fracture or focal lesion identified. Mild soft tissue swelling seen over the left frontal skull. Sinuses/Orbits: The visualized paranasal sinuses and mastoid air cells are clear. The orbits and globes intact. Other: None Cervical spine: Alignment: Physiologic Skull base and vertebrae: Visualized skull base is intact. No atlanto-occipital dissociation. The vertebral body heights are well maintained. No fracture or pathologic osseous lesion seen. Soft tissues and spinal canal: The visualized paraspinal soft tissues are unremarkable. No prevertebral soft tissue swelling is seen. The spinal canal is grossly unremarkable, no large epidural collection or significant canal narrowing. Disc levels: Multilevel cervical spine spondylosis seen with disc osteophyte complex and uncovertebral osteophytes most notable at C4-C5 and C5-C6. Upper chest: The lung apices are clear. Thoracic inlet is within normal limits. Other: None IMPRESSION: No acute intracranial abnormality. Findings consistent with age related atrophy and chronic small vessel ischemia No acute fracture or malalignment of  the spine. Electronically Signed   By: Prudencio Pair M.D.   On: 04/07/2020 20:43   DG Hip Unilat With Pelvis 2-3 Views Left  Result Date: 04/07/2020 CLINICAL DATA:  Pain EXAM: DG HIP (WITH OR WITHOUT PELVIS) 2-3V LEFT COMPARISON:  None. FINDINGS: There is an acute, comminuted and displaced intratrochanteric fracture of the proximal left femur. There is no dislocation. There is osteopenia. There are moderate degenerative changes of both hips. IMPRESSION: Acute, comminuted and displaced intratrochanteric fracture of the proximal left femur. Electronically Signed   By: Constance Holster M.D.   On: 04/07/2020 20:24    Lab Data:  CBC: Recent Labs  Lab 04/07/20 1921 04/08/20 0410  WBC 14.1* 12.8*  NEUTROABS 10.0*  --   HGB 11.3* 10.6*  HCT 35.1* 34.0*  MCV 82.2 83.7  PLT 341 601   Basic Metabolic Panel: Recent Labs  Lab 04/07/20 1921 04/08/20 0410  NA 139 139  K 4.1 4.5  CL 102 105  CO2 23 24  GLUCOSE 147* 171*  BUN 27* 20  CREATININE 0.70 0.58  CALCIUM 8.7* 8.7*   GFR: Estimated Creatinine Clearance: 39.7 mL/min (by C-G formula based on SCr of 0.58 mg/dL). Liver Function Tests: No results for input(s): AST, ALT, ALKPHOS, BILITOT, PROT, ALBUMIN in the last 168 hours. No results for input(s): LIPASE, AMYLASE in the last 168 hours. No results for input(s): AMMONIA in the last 168 hours. Coagulation Profile: Recent Labs  Lab 04/07/20 1921  INR 1.0   Cardiac Enzymes: No results for input(s): CKTOTAL, CKMB, CKMBINDEX, TROPONINI in the last 168 hours. BNP (last 3 results) No results for input(s): PROBNP in the last 8760 hours. HbA1C: No results for input(s): HGBA1C in the last 72 hours. CBG: Recent Labs  Lab 04/07/20 2322 04/08/20 0635  GLUCAP 170* 131*   Lipid Profile: No results for input(s): CHOL, HDL, LDLCALC, TRIG, CHOLHDL, LDLDIRECT in the last 72 hours. Thyroid Function Tests: No results for input(s): TSH, T4TOTAL, FREET4, T3FREE, THYROIDAB in the last 72  hours. Anemia Panel: No results for input(s): VITAMINB12, FOLATE, FERRITIN, TIBC, IRON, RETICCTPCT in the last 72 hours. Urine analysis: No results found for: COLORURINE, APPEARANCEUR, LABSPEC, PHURINE, GLUCOSEU, HGBUR, BILIRUBINUR, KETONESUR, PROTEINUR, UROBILINOGEN, NITRITE, LEUKOCYTESUR   Ehab Humber M.D. [facility] 04/08/2020, 8:05 AM   Call night coverage person covering after 7pm

## 2020-04-08 NOTE — ED Notes (Signed)
Dinner Tray Ordered @ 1649. 

## 2020-04-08 NOTE — ED Notes (Signed)
Lunch Tray Ordered @ 1036. 

## 2020-04-08 NOTE — Consult Note (Signed)
Reason for Consult:Left hip and wrist fxs Referring Physician: R Rai Time called: 0730 Time at bedside: Torboy [name] is an 87 y.o. female.  HPI: Lisa Castillo was at the facility where she resides. She went outside and fell while stepping off a curb. She had immediate left hip and wrist pain and could not get up. She was brought to the ED where x-rays showed left wrist and hip fxs and orthopedic surgery was consulted. She has some dementia which makes history suspect.  Past Medical History:  Diagnosis Date  . Hearing loss   . History of fracture of clavicle   . Hypothyroidism   . Insomnia   . Tinnitus     Past Surgical History:  Procedure Laterality Date  . CHOLECYSTECTOMY  2007   Dr. Verdene Lennert  . CLAVICLE EXCISION  1986  . MOHS SURGERY     past 10 years chest & legs    Family History  Problem Relation Age of Onset  . Heart disease Mother   . Heart disease Father     Social History:  reports that she has never smoked. She has never used smokeless tobacco. She reports current alcohol use. She reports that she does not use drugs.  Allergies:  Allergies  Allergen Reactions  . Metformin And Related Diarrhea    Medications: I have reviewed the patient's current medications.  Results for orders placed or performed during the hospital encounter of 04/07/20 (from the past 48 hour(s))  Basic metabolic panel     Status: Abnormal   Collection Time: 04/07/20  7:21 PM  Result Value Ref Range   Sodium 139 135 - 145 mmol/L   Potassium 4.1 3.5 - 5.1 mmol/L   Chloride 102 98 - 111 mmol/L   CO2 23 22 - 32 mmol/L   Glucose, Bld 147 (H) 70 - 99 mg/dL    Comment: Glucose reference range applies only to samples taken after fasting for at least 8 hours.   BUN 27 (H) 8 - 23 mg/dL   Creatinine, Ser 0.70 0.44 - 1.00 mg/dL   Calcium 8.7 (L) 8.9 - 10.3 mg/dL   GFR, Estimated >60 >60 mL/min    Comment: (NOTE) Calculated using the CKD-EPI Creatinine Equation (2021)    Anion gap 14 5 - 15     Comment: Performed at [facility] [address], [place], [place] [address]  CBC WITH DIFFERENTIAL     Status: Abnormal   Collection Time: 04/07/20  7:21 PM  Result Value Ref Range   WBC 14.1 (H) 4.0 - 10.5 K/uL   RBC 4.27 3.87 - 5.11 MIL/uL   Hemoglobin 11.3 (L) 12.0 - 15.0 g/dL   HCT 35.1 (L) 36.0 - 46.0 %   MCV 82.2 80.0 - 100.0 fL   MCH 26.5 26.0 - 34.0 pg   MCHC 32.2 30.0 - 36.0 g/dL   RDW 15.8 (H) 11.5 - 15.5 %   Platelets 341 150 - 400 K/uL   nRBC 0.0 0.0 - 0.2 %   Neutrophils Relative % 71 %   Neutro Abs 10.0 (H) 1.7 - 7.7 K/uL   Lymphocytes Relative 18 %   Lymphs Abs 2.6 0.7 - 4.0 K/uL   Monocytes Relative 6 %   Monocytes Absolute 0.9 0.1 - 1.0 K/uL   Eosinophils Relative 2 %   Eosinophils Absolute 0.3 0.0 - 0.5 K/uL   Basophils Relative 1 %   Basophils Absolute 0.1 0.0 - 0.1 K/uL   Immature Granulocytes  2 %   Abs Immature Granulocytes 0.27 (H) 0.00 - 0.07 K/uL    Comment: Performed at [facility], [place] [address] [place], [place] [address]  Protime-INR     Status: None   Collection Time: 04/07/20  7:21 PM  Result Value Ref Range   Prothrombin Time 13.0 11.4 - 15.2 seconds   INR 1.0 0.8 - 1.2    Comment: (NOTE) INR goal varies based on device and disease states. Performed at [facility], [place] [address], [place], [place] [address]   Type and screen [place]     Status: None   Collection Time: 04/07/20  7:21 PM  Result Value Ref Range   ABO/RH(D) A NEG    Antibody Screen NEG    Sample Expiration      04/10/2020,2359 Performed at [facility], [place] [address], [place], [place] [address]   SARS CORONAVIRUS 2 (TAT 6-24 HRS) Nasopharyngeal Nasopharyngeal Swab     Status: None   Collection Time: 04/07/20  7:34 PM   Specimen: Nasopharyngeal Swab  Result Value Ref Range   SARS Coronavirus 2 NEGATIVE NEGATIVE    Comment: (NOTE) SARS-CoV-2 target nucleic acids are NOT DETECTED.  The SARS-CoV-2 RNA is generally  detectable in upper and lower respiratory specimens during the acute phase of infection. Negative results do not preclude SARS-CoV-2 infection, do not rule out co-infections with other pathogens, and should not be used as the sole basis for treatment or other patient management decisions. Negative results must be combined with clinical observations, patient history, and epidemiological information. The expected result is Negative.  Fact Sheet for Patients: SugarRoll.be  Fact Sheet for Healthcare Providers: https://www.woods-mathews.com/  This test is not yet approved or cleared by the [place] FDA and  has been authorized for detection and/or diagnosis of SARS-CoV-2 by FDA under an Emergency Use Authorization (EUA). This EUA will remain  in effect (meaning this test can be used) for the duration of the COVID-19 declaration under Se ction 564(b)(1) of the Act, 21 U.S.C. section 360bbb-3(b)(1), unless the authorization is terminated or revoked sooner.  Performed at [facility], [place] [address], [place], [place] [address]   ABO/Rh     Status: None   Collection Time: 04/07/20  7:56 PM  Result Value Ref Range   ABO/RH(D)      A NEG Performed at [facility] [address] [place], [place] [address]   CBG monitoring, ED     Status: Abnormal   Collection Time: 04/07/20 11:22 PM  Result Value Ref Range   Glucose-Capillary 170 (H) 70 - 99 mg/dL    Comment: Glucose reference range applies only to samples taken after fasting for at least 8 hours.  CBC     Status: Abnormal   Collection Time: 04/08/20  4:10 AM  Result Value Ref Range   WBC 12.8 (H) 4.0 - 10.5 K/uL   RBC 4.06 3.87 - 5.11 MIL/uL   Hemoglobin 10.6 (L) 12.0 - 15.0 g/dL   HCT 34.0 (L) 36.0 - 46.0 %   MCV 83.7 80.0 - 100.0 fL   MCH 26.1 26.0 - 34.0 pg   MCHC 31.2 30.0 - 36.0 g/dL   RDW 15.4 11.5 - 15.5 %   Platelets 320 150 - 400 K/uL   nRBC 0.0 0.0 - 0.2 %     Comment: Performed at [facility], [place] [address] [place],  [address]  Basic metabolic  panel     Status: Abnormal   Collection Time: 04/08/20  4:10 AM  Result Value Ref Range   Sodium 139 135 - 145 mmol/L   Potassium 4.5 3.5 - 5.1 mmol/L   Chloride 105 98 - 111 mmol/L   CO2 24 22 - 32 mmol/L   Glucose, Bld 171 (H) 70 - 99 mg/dL    Comment: Glucose reference range applies only to samples taken after fasting for at least 8 hours.   BUN 20 8 - 23 mg/dL   Creatinine, Ser 0.58 0.44 - 1.00 mg/dL   Calcium 8.7 (L) 8.9 - 10.3 mg/dL   GFR, Estimated >60 >60 mL/min    Comment: (NOTE) Calculated using the CKD-EPI Creatinine Equation (2021)    Anion gap 10 5 - 15    Comment: Performed at [facility] [address] [place], [place] [address]  CBG monitoring, ED     Status: Abnormal   Collection Time: 04/08/20  6:35 AM  Result Value Ref Range   Glucose-Capillary 131 (H) 70 - 99 mg/dL    Comment: Glucose reference range applies only to samples taken after fasting for at least 8 hours.  CBG monitoring, ED     Status: Abnormal   Collection Time: 04/08/20  8:12 AM  Result Value Ref Range   Glucose-Capillary 124 (H) 70 - 99 mg/dL    Comment: Glucose reference range applies only to samples taken after fasting for at least 8 hours.    DG Chest 1 View  Result Date: 04/07/2020 CLINICAL DATA:  Found down, dementia EXAM: CHEST  1 VIEW COMPARISON:  None. FINDINGS: Single frontal view of the chest demonstrates an unremarkable cardiac silhouette. No airspace disease, effusion, or pneumothorax. No acute bony abnormalities. IMPRESSION: 1. No acute intrathoracic process. Electronically Signed   By: Randa Ngo M.D.   On: 04/07/2020 20:24   DG Wrist Complete Left  Result Date: 04/07/2020 CLINICAL DATA:  Post reduction film of known left wrist fracture. EXAM: LEFT WRIST - COMPLETE 3+ VIEW COMPARISON:  None. FINDINGS: Splinting material obscures fine bony and soft tissue detail. Interval  splinting with similar alignment of the displaced, impacted dorsally angulated fracture through the distal radius. IMPRESSION: Interval splinting with similar alignment of the distal radial fracture. Electronically Signed   By: Dahlia Bailiff MD   On: 04/07/2020 23:11   DG Wrist Complete Left  Result Date: 04/07/2020 CLINICAL DATA:  Pain EXAM: LEFT WRIST - COMPLETE 3+ VIEW COMPARISON:  None. FINDINGS: There is an acute, displaced and impacted fracture through the distal radius with significant dorsal angulation. There is surrounding soft tissue swelling. No frank dislocation. There is an old healed fracture of the fourth metacarpal. There are degenerative changes of the partially visualized interphalangeal joints. IMPRESSION: Acute, displaced, impacted fracture through the distal radius with significant dorsal angulation. Electronically Signed   By: Constance Holster M.D.   On: 04/07/2020 20:24   DG Ankle Complete Left  Result Date: 04/07/2020 CLINICAL DATA:  Pain EXAM: LEFT ANKLE COMPLETE - 3+ VIEW COMPARISON:  None. FINDINGS: There is no evidence of fracture, dislocation, or joint effusion. There is no evidence of arthropathy or other focal bone abnormality. Soft tissues are unremarkable. IMPRESSION: Negative. Electronically Signed   By: Constance Holster M.D.   On: 04/07/2020 20:25   CT HEAD WO CONTRAST  Result Date: 04/07/2020 CLINICAL DATA:  Facial trauma EXAM: CT HEAD WITHOUT CONTRAST TECHNIQUE: Contiguous axial images were obtained from the base of the skull through  the vertex without intravenous contrast. COMPARISON:  None. FINDINGS: Brain: No evidence of acute territorial infarction, hemorrhage, hydrocephalus,extra-axial collection or mass lesion/mass effect. There is dilatation the ventricles and sulci consistent with age-related atrophy. Low-attenuation changes in the deep white matter consistent with small vessel ischemia. Vascular: No hyperdense vessel or unexpected calcification. Skull: The  skull is intact. No fracture or focal lesion identified. Mild soft tissue swelling seen over the left frontal skull. Sinuses/Orbits: The visualized paranasal sinuses and mastoid air cells are clear. The orbits and globes intact. Other: None Cervical spine: Alignment: Physiologic Skull base and vertebrae: Visualized skull base is intact. No atlanto-occipital dissociation. The vertebral body heights are well maintained. No fracture or pathologic osseous lesion seen. Soft tissues and spinal canal: The visualized paraspinal soft tissues are unremarkable. No prevertebral soft tissue swelling is seen. The spinal canal is grossly unremarkable, no large epidural collection or significant canal narrowing. Disc levels: Multilevel cervical spine spondylosis seen with disc osteophyte complex and uncovertebral osteophytes most notable at C4-C5 and C5-C6. Upper chest: The lung apices are clear. Thoracic inlet is within normal limits. Other: None IMPRESSION: No acute intracranial abnormality. Findings consistent with age related atrophy and chronic small vessel ischemia No acute fracture or malalignment of the spine. Electronically Signed   By: Prudencio Pair M.D.   On: 04/07/2020 20:43   CT CERVICAL SPINE WO CONTRAST  Result Date: 04/07/2020 CLINICAL DATA:  Facial trauma EXAM: CT HEAD WITHOUT CONTRAST TECHNIQUE: Contiguous axial images were obtained from the base of the skull through the vertex without intravenous contrast. COMPARISON:  None. FINDINGS: Brain: No evidence of acute territorial infarction, hemorrhage, hydrocephalus,extra-axial collection or mass lesion/mass effect. There is dilatation the ventricles and sulci consistent with age-related atrophy. Low-attenuation changes in the deep white matter consistent with small vessel ischemia. Vascular: No hyperdense vessel or unexpected calcification. Skull: The skull is intact. No fracture or focal lesion identified. Mild soft tissue swelling seen over the left frontal  skull. Sinuses/Orbits: The visualized paranasal sinuses and mastoid air cells are clear. The orbits and globes intact. Other: None Cervical spine: Alignment: Physiologic Skull base and vertebrae: Visualized skull base is intact. No atlanto-occipital dissociation. The vertebral body heights are well maintained. No fracture or pathologic osseous lesion seen. Soft tissues and spinal canal: The visualized paraspinal soft tissues are unremarkable. No prevertebral soft tissue swelling is seen. The spinal canal is grossly unremarkable, no large epidural collection or significant canal narrowing. Disc levels: Multilevel cervical spine spondylosis seen with disc osteophyte complex and uncovertebral osteophytes most notable at C4-C5 and C5-C6. Upper chest: The lung apices are clear. Thoracic inlet is within normal limits. Other: None IMPRESSION: No acute intracranial abnormality. Findings consistent with age related atrophy and chronic small vessel ischemia No acute fracture or malalignment of the spine. Electronically Signed   By: Prudencio Pair M.D.   On: 04/07/2020 20:43   DG Hip Unilat With Pelvis 2-3 Views Left  Result Date: 04/07/2020 CLINICAL DATA:  Pain EXAM: DG HIP (WITH OR WITHOUT PELVIS) 2-3V LEFT COMPARISON:  None. FINDINGS: There is an acute, comminuted and displaced intratrochanteric fracture of the proximal left femur. There is no dislocation. There is osteopenia. There are moderate degenerative changes of both hips. IMPRESSION: Acute, comminuted and displaced intratrochanteric fracture of the proximal left femur. Electronically Signed   By: Constance Holster M.D.   On: 04/07/2020 20:24    Review of Systems  Unable to perform ROS: Dementia  Musculoskeletal: Positive for arthralgias (Left hip and wrist).  Blood pressure (!) 143/76, pulse 96, temperature 97.6 F (36.4 C), temperature source Axillary, resp. rate (!) 21, height 5' (1.524 m), weight 63.5 kg, SpO2 97 %. Physical Exam Constitutional:       General: She is not in acute distress.    Appearance: She is well-developed and well-nourished. She is not diaphoretic.  HENT:     Head: Normocephalic and atraumatic.  Eyes:     General: No scleral icterus.       Right eye: No discharge.        Left eye: No discharge.     Conjunctiva/sclera: Conjunctivae normal.  Cardiovascular:     Rate and Rhythm: Normal rate and regular rhythm.  Pulmonary:     Effort: Pulmonary effort is normal. No respiratory distress.  Musculoskeletal:     Cervical back: Normal range of motion.     Comments: Left shoulder, elbow, wrist, digits- no skin wounds, splint in place, no instability, no blocks to motion  Sens  Ax/R/M/U could not assess  Mot   Ax/ R/ PIN/ M/ AIN/ U grossly intact  Cap refill <2s  LLE No traumatic wounds, ecchymosis, or rash  Mild TTP hip  No knee or ankle effusion  Knee stable to varus/ valgus and anterior/posterior stress  Sens DPN, SPN, TN grossly intact  Motor EHL, ext, flex, evers grossly intact  DP 2+, PT 1+, No significant edema  Skin:    General: Skin is warm and dry.  Neurological:     Mental Status: She is alert.  Psychiatric:        Mood and Affect: Mood and affect normal.        Behavior: Behavior normal.     Assessment/Plan: Left hip fx -- Plan IMN tomorrow by Dr. Erlinda Hong. Please keep NPO after MN. Left wrist fx -- Plan CR vs ORIF tomorrow by Dr. Erlinda Hong. Multiple medical problems including HTN, hypothyroidism, and dementia -- per primary service. Will give diet for today.    Lisette Abu, PA-C Orthopedic Surgery [phone] 04/08/2020, 9:23 AM

## 2020-04-09 ENCOUNTER — Inpatient Hospital Stay (HOSPITAL_COMMUNITY): Payer: Medicare Other | Admitting: Anesthesiology

## 2020-04-09 ENCOUNTER — Inpatient Hospital Stay (HOSPITAL_COMMUNITY): Payer: Medicare Other

## 2020-04-09 ENCOUNTER — Encounter (HOSPITAL_COMMUNITY): Payer: Self-pay | Admitting: Family Medicine

## 2020-04-09 ENCOUNTER — Encounter (HOSPITAL_COMMUNITY)
Admission: EM | Disposition: A | Discharge status: Skilled Nursing Facility | Payer: Self-pay | Source: Skilled Nursing Facility | Attending: Internal Medicine

## 2020-04-09 DIAGNOSIS — S62102A Fracture of unspecified carpal bone, left wrist, initial encounter for closed fracture: Secondary | ICD-10-CM | POA: Diagnosis not present

## 2020-04-09 DIAGNOSIS — S72002A Fracture of unspecified part of neck of left femur, initial encounter for closed fracture: Secondary | ICD-10-CM | POA: Diagnosis not present

## 2020-04-09 HISTORY — PX: INTRAMEDULLARY (IM) NAIL INTERTROCHANTERIC: SHX5875

## 2020-04-09 HISTORY — PX: OPEN REDUCTION INTERNAL FIXATION (ORIF) DISTAL RADIAL FRACTURE: SHX5989

## 2020-04-09 LAB — CBC
HCT: 30.8 % — ABNORMAL LOW (ref 36.0–46.0)
HCT: 30.5 % — ABNORMAL LOW (ref 36.0–46.0)
Hemoglobin: 10.1 g/dL — ABNORMAL LOW (ref 12.0–15.0)
Hemoglobin: 9.8 g/dL — ABNORMAL LOW (ref 12.0–15.0)
MCH: 26.5 pg (ref 26.0–34.0)
MCH: 26.3 pg (ref 26.0–34.0)
MCHC: 32.8 g/dL (ref 30.0–36.0)
MCHC: 32.1 g/dL (ref 30.0–36.0)
MCV: 80.8 fL (ref 80.0–100.0)
MCV: 82 fL (ref 80.0–100.0)
Platelets: 347 10*3/uL (ref 150–400)
Platelets: 310 10*3/uL (ref 150–400)
RBC: 3.81 MIL/uL — ABNORMAL LOW (ref 3.87–5.11)
RBC: 3.72 MIL/uL — ABNORMAL LOW (ref 3.87–5.11)
RDW: 15.3 % (ref 11.5–15.5)
RDW: 15.3 % (ref 11.5–15.5)
WBC: 15.1 10*3/uL — ABNORMAL HIGH (ref 4.0–10.5)
WBC: 15.2 10*3/uL — ABNORMAL HIGH (ref 4.0–10.5)
nRBC: 0 % (ref 0.0–0.2)
nRBC: 0 % (ref 0.0–0.2)

## 2020-04-09 LAB — BASIC METABOLIC PANEL
Anion gap: 9 (ref 5–15)
BUN: 12 mg/dL (ref 8–23)
CO2: 25 mmol/L (ref 22–32)
Calcium: 8.8 mg/dL — ABNORMAL LOW (ref 8.9–10.3)
Chloride: 104 mmol/L (ref 98–111)
Creatinine, Ser: 0.56 mg/dL (ref 0.44–1.00)
GFR, Estimated: >60 mL/min (ref >60)
Glucose, Bld: 134 mg/dL — ABNORMAL HIGH (ref 70–99)
Potassium: 3.9 mmol/L (ref 3.5–5.1)
Sodium: 138 mmol/L (ref 135–145)

## 2020-04-09 LAB — MRSA PCR SCREENING: MRSA by PCR: NEGATIVE

## 2020-04-09 LAB — CBG MONITORING, ED: Glucose-Capillary: 134 mg/dL — ABNORMAL HIGH (ref 70–99)

## 2020-04-09 LAB — CREATININE, SERUM
Creatinine, Ser: 0.65 mg/dL (ref 0.44–1.00)
GFR, Estimated: >60 mL/min (ref >60)

## 2020-04-09 LAB — GLUCOSE, CAPILLARY
Glucose-Capillary: 135 mg/dL — ABNORMAL HIGH (ref 70–99)
Glucose-Capillary: 112 mg/dL — ABNORMAL HIGH (ref 70–99)
Glucose-Capillary: 126 mg/dL — ABNORMAL HIGH (ref 70–99)
Glucose-Capillary: 179 mg/dL — ABNORMAL HIGH (ref 70–99)

## 2020-04-09 SURGERY — FIXATION, FRACTURE, INTERTROCHANTERIC, WITH INTRAMEDULLARY ROD
Anesthesia: Monitor Anesthesia Care | Laterality: Left

## 2020-04-09 MED ORDER — OXYCODONE HCL 5 MG PO TABS: 5.0000 mg | ORAL_TABLET | ORAL | Status: DC | PRN

## 2020-04-09 MED ORDER — CEFAZOLIN SODIUM-DEXTROSE 2-4 GM/100ML-% IV SOLN
2.0000 g | Freq: Four times a day (QID) | INTRAVENOUS | Status: AC
  Administered 2020-04-09 – 2020-04-10 (×3): 2 g via INTRAVENOUS
  Filled 2020-04-09 (×3): qty 100

## 2020-04-09 MED ORDER — ONDANSETRON HCL 4 MG/2ML IJ SOLN: 4.0000 mg | Freq: Four times a day (QID) | INTRAMUSCULAR | Status: DC | PRN

## 2020-04-09 MED ORDER — LACTATED RINGERS IV SOLN: INTRAVENOUS | Status: DC

## 2020-04-09 MED ORDER — POLYETHYLENE GLYCOL 3350 17 G PO PACK: 17.0000 g | PACK | Freq: Every day | ORAL | Status: DC | PRN

## 2020-04-09 MED ORDER — HYDROCODONE-ACETAMINOPHEN 5-325 MG PO TABS: 1.0000 | ORAL_TABLET | Freq: Four times a day (QID) | ORAL | 0 refills | Status: DC | PRN

## 2020-04-09 MED ORDER — BUPIVACAINE HCL (PF) 0.25 % IJ SOLN
INTRAMUSCULAR | Status: AC
  Filled 2020-04-09: qty 30

## 2020-04-09 MED ORDER — DEXMEDETOMIDINE (PRECEDEX) IN NS 20 MCG/5ML (4 MCG/ML) IV SYRINGE
PREFILLED_SYRINGE | INTRAVENOUS | Status: DC | PRN
  Administered 2020-04-09: 8 ug via INTRAVENOUS

## 2020-04-09 MED ORDER — CHLORHEXIDINE GLUCONATE 0.12 % MT SOLN
OROMUCOSAL | Status: AC
  Filled 2020-04-09: qty 15

## 2020-04-09 MED ORDER — ENOXAPARIN SODIUM 40 MG/0.4ML ~~LOC~~ SOLN
40.0000 mg | SUBCUTANEOUS | Status: DC
  Administered 2020-04-10 – 2020-04-14 (×5): 40 mg via SUBCUTANEOUS
  Filled 2020-04-09 (×5): qty 0.4

## 2020-04-09 MED ORDER — MAGNESIUM CITRATE PO SOLN: 1.0000 | Freq: Once | ORAL | Status: DC | PRN

## 2020-04-09 MED ORDER — ACETAMINOPHEN 500 MG PO TABS: 1000.0000 mg | ORAL_TABLET | Freq: Once | ORAL | Status: DC

## 2020-04-09 MED ORDER — ONDANSETRON HCL 4 MG PO TABS: 4.0000 mg | ORAL_TABLET | Freq: Four times a day (QID) | ORAL | Status: DC | PRN

## 2020-04-09 MED ORDER — ONDANSETRON HCL 4 MG/2ML IJ SOLN: 4.0000 mg | Freq: Once | INTRAMUSCULAR | Status: DC | PRN

## 2020-04-09 MED ORDER — ONDANSETRON HCL 4 MG/2ML IJ SOLN
INTRAMUSCULAR | Status: DC | PRN
  Administered 2020-04-09: 4 mg via INTRAVENOUS

## 2020-04-09 MED ORDER — DEXAMETHASONE SODIUM PHOSPHATE 10 MG/ML IJ SOLN
INTRAMUSCULAR | Status: DC | PRN
  Administered 2020-04-09: 5 mg via INTRAVENOUS

## 2020-04-09 MED ORDER — ALUM & MAG HYDROXIDE-SIMETH 200-200-20 MG/5ML PO SUSP: 30.0000 mL | ORAL | Status: DC | PRN

## 2020-04-09 MED ORDER — METHOCARBAMOL 500 MG PO TABS
500.0000 mg | ORAL_TABLET | Freq: Four times a day (QID) | ORAL | Status: DC | PRN
  Administered 2020-04-13: 500 mg via ORAL
  Filled 2020-04-09: qty 1

## 2020-04-09 MED ORDER — FENTANYL CITRATE (PF) 250 MCG/5ML IJ SOLN
INTRAMUSCULAR | Status: DC | PRN
  Administered 2020-04-09 (×2): 50 ug via INTRAVENOUS

## 2020-04-09 MED ORDER — OXYCODONE HCL 5 MG PO TABS: 10.0000 mg | ORAL_TABLET | ORAL | Status: DC | PRN

## 2020-04-09 MED ORDER — DOCUSATE SODIUM 100 MG PO CAPS
100.0000 mg | ORAL_CAPSULE | Freq: Two times a day (BID) | ORAL | Status: DC
  Administered 2020-04-09 – 2020-04-14 (×9): 100 mg via ORAL
  Filled 2020-04-09 (×10): qty 1

## 2020-04-09 MED ORDER — ENOXAPARIN SODIUM 40 MG/0.4ML ~~LOC~~ SOLN: 40.0000 mg | Freq: Every day | SUBCUTANEOUS | 0 refills | Status: DC

## 2020-04-09 MED ORDER — CEFAZOLIN SODIUM-DEXTROSE 2-4 GM/100ML-% IV SOLN
2.0000 g | INTRAVENOUS | Status: AC
  Administered 2020-04-09: 2 g via INTRAVENOUS
  Filled 2020-04-09: qty 100

## 2020-04-09 MED ORDER — SORBITOL 70 % SOLN
30.0000 mL | Freq: Every day | Status: DC | PRN
  Filled 2020-04-09: qty 30

## 2020-04-09 MED ORDER — FENTANYL CITRATE (PF) 250 MCG/5ML IJ SOLN
INTRAMUSCULAR | Status: AC
  Filled 2020-04-09: qty 5

## 2020-04-09 MED ORDER — SODIUM CHLORIDE 0.9 % IV SOLN: INTRAVENOUS | Status: DC

## 2020-04-09 MED ORDER — CHLORHEXIDINE GLUCONATE CLOTH 2 % EX PADS
6.0000 | MEDICATED_PAD | Freq: Every day | CUTANEOUS | Status: DC
  Administered 2020-04-10 – 2020-04-14 (×5): 6 via TOPICAL

## 2020-04-09 MED ORDER — CHLORHEXIDINE GLUCONATE 4 % EX LIQD
60.0000 mL | Freq: Once | CUTANEOUS | Status: DC
  Filled 2020-04-09: qty 60

## 2020-04-09 MED ORDER — 0.9 % SODIUM CHLORIDE (POUR BTL) OPTIME
TOPICAL | Status: DC | PRN
  Administered 2020-04-09: 1000 mL

## 2020-04-09 MED ORDER — PHENOL 1.4 % MT LIQD: 1.0000 | OROMUCOSAL | Status: DC | PRN

## 2020-04-09 MED ORDER — MENTHOL 3 MG MT LOZG: 1.0000 | LOZENGE | OROMUCOSAL | Status: DC | PRN

## 2020-04-09 MED ORDER — ACETAMINOPHEN 500 MG PO TABS
1000.0000 mg | ORAL_TABLET | Freq: Four times a day (QID) | ORAL | Status: AC
  Administered 2020-04-10 (×3): 1000 mg via ORAL
  Filled 2020-04-09 (×4): qty 2

## 2020-04-09 MED ORDER — PROPOFOL 10 MG/ML IV BOLUS
INTRAVENOUS | Status: DC | PRN
  Administered 2020-04-09 (×4): 20 mg via INTRAVENOUS

## 2020-04-09 MED ORDER — PROPOFOL 500 MG/50ML IV EMUL
INTRAVENOUS | Status: DC | PRN
  Administered 2020-04-09: 85 ug/kg/min via INTRAVENOUS

## 2020-04-09 MED ORDER — FENTANYL CITRATE (PF) 100 MCG/2ML IJ SOLN: 25.0000 ug | INTRAMUSCULAR | Status: DC | PRN

## 2020-04-09 MED ORDER — METHOCARBAMOL 1000 MG/10ML IJ SOLN
500.0000 mg | Freq: Four times a day (QID) | INTRAVENOUS | Status: DC | PRN
  Filled 2020-04-09: qty 5

## 2020-04-09 MED ORDER — POVIDONE-IODINE 10 % EX SWAB: 2.0000 "application " | Freq: Once | CUTANEOUS | Status: DC

## 2020-04-09 MED ORDER — ACETAMINOPHEN 325 MG PO TABS
325.0000 mg | ORAL_TABLET | Freq: Four times a day (QID) | ORAL | Status: DC | PRN
  Administered 2020-04-13 (×2): 650 mg via ORAL
  Filled 2020-04-09 (×2): qty 2

## 2020-04-09 MED ORDER — CHLORHEXIDINE GLUCONATE 0.12 % MT SOLN: 15.0000 mL | Freq: Once | OROMUCOSAL | Status: DC

## 2020-04-09 MED ORDER — HYDROMORPHONE HCL 1 MG/ML IJ SOLN: 0.5000 mg | INTRAMUSCULAR | Status: DC | PRN

## 2020-04-09 SURGICAL SUPPLY — 75 items
BIT DRILL INTERTAN LAG SCREW (BIT) ×1 IMPLANT
BIT DRILL SHORT 4.0 (BIT) IMPLANT
BLADE CLIPPER SURG (BLADE) IMPLANT
BLADE SURG 10 STRL SS (BLADE) ×2 IMPLANT
BLADE SURG 15 STRL LF DISP TIS (BLADE) ×1 IMPLANT
BLADE SURG 15 STRL SS (BLADE) ×2
BNDG CMPR 9X4 STRL LF SNTH (GAUZE/BANDAGES/DRESSINGS) ×1
BNDG COHESIVE 4X5 TAN STRL (GAUZE/BANDAGES/DRESSINGS) ×2 IMPLANT
BNDG COHESIVE 6X5 TAN STRL LF (GAUZE/BANDAGES/DRESSINGS) IMPLANT
BNDG ELASTIC 3X5.8 VLCR STR LF (GAUZE/BANDAGES/DRESSINGS) ×2 IMPLANT
BNDG ELASTIC 4X5.8 VLCR STR LF (GAUZE/BANDAGES/DRESSINGS) ×2 IMPLANT
BNDG ESMARK 4X9 LF (GAUZE/BANDAGES/DRESSINGS) ×2 IMPLANT
BNDG GAUZE ELAST 4 BULKY (GAUZE/BANDAGES/DRESSINGS) ×2 IMPLANT
CORD BIPOLAR FORCEPS 12FT (ELECTRODE) ×2 IMPLANT
COVER PERINEAL POST (MISCELLANEOUS) ×2 IMPLANT
COVER SURGICAL LIGHT HANDLE (MISCELLANEOUS) ×2 IMPLANT
COVER WAND RF STERILE (DRAPES) ×2 IMPLANT
CUFF TOURN SGL QUICK 18X4 (TOURNIQUET CUFF) ×2 IMPLANT
CUFF TOURN SGL QUICK 24 (TOURNIQUET CUFF)
CUFF TRNQT CYL 24X4X16.5-23 (TOURNIQUET CUFF) IMPLANT
DRAPE C-ARM 42X72 X-RAY (DRAPES) IMPLANT
DRAPE C-ARMOR (DRAPES) ×2 IMPLANT
DRAPE STERI IOBAN 125X83 (DRAPES) ×2 IMPLANT
DRAPE U-SHAPE 47X51 STRL (DRAPES) ×2 IMPLANT
DRILL BIT SHORT 4.0 (BIT) ×2
DRSG MEPILEX BORDER 4X4 (GAUZE/BANDAGES/DRESSINGS) ×2 IMPLANT
DRSG MEPILEX BORDER 4X8 (GAUZE/BANDAGES/DRESSINGS) ×2 IMPLANT
DRSG PAD ABDOMINAL 8X10 ST (GAUZE/BANDAGES/DRESSINGS) ×4 IMPLANT
DURAPREP 26ML APPLICATOR (WOUND CARE) ×2 IMPLANT
ELECT CAUTERY BLADE 6.4 (BLADE) ×2 IMPLANT
ELECT REM PT RETURN 9FT ADLT (ELECTROSURGICAL) ×2
ELECTRODE REM PT RTRN 9FT ADLT (ELECTROSURGICAL) ×1 IMPLANT
GAUZE SPONGE 4X4 12PLY STRL (GAUZE/BANDAGES/DRESSINGS) ×2 IMPLANT
GAUZE XEROFORM 1X8 LF (GAUZE/BANDAGES/DRESSINGS) ×2 IMPLANT
GLOVE BIOGEL PI IND STRL 7.0 (GLOVE) ×1 IMPLANT
GLOVE BIOGEL PI INDICATOR 7.0 (GLOVE) ×1
GLOVE ECLIPSE 7.0 STRL STRAW (GLOVE) ×2 IMPLANT
GLOVE SKINSENSE NS SZ7.5 (GLOVE) ×2
GLOVE SKINSENSE STRL SZ7.5 (GLOVE) ×2 IMPLANT
GOWN STRL REIN XL XLG (GOWN DISPOSABLE) ×2 IMPLANT
GUIDE PIN 3.2X343 (PIN) ×2
GUIDE PIN 3.2X343MM (PIN) ×4
GUIDE ROD 3.0 (MISCELLANEOUS) ×2
KIT BASIN OR (CUSTOM PROCEDURE TRAY) ×2 IMPLANT
KIT TURNOVER KIT B (KITS) ×2 IMPLANT
MANIFOLD NEPTUNE II (INSTRUMENTS) ×2 IMPLANT
NAIL LEFT 10X36 (Nail) ×1 IMPLANT
NEEDLE 22X1 1/2 (OR ONLY) (NEEDLE) IMPLANT
NS IRRIG 1000ML POUR BTL (IV SOLUTION) ×2 IMPLANT
PACK GENERAL/GYN (CUSTOM PROCEDURE TRAY) ×2 IMPLANT
PACK ORTHO EXTREMITY (CUSTOM PROCEDURE TRAY) ×2 IMPLANT
PAD ARMBOARD 7.5X6 YLW CONV (MISCELLANEOUS) ×4 IMPLANT
PAD CAST 4YDX4 CTTN HI CHSV (CAST SUPPLIES) ×2 IMPLANT
PADDING CAST COTTON 4X4 STRL (CAST SUPPLIES) ×2
PIN GUIDE 3.2X343MM (PIN) IMPLANT
ROD GUIDE 3.0 (MISCELLANEOUS) IMPLANT
SCREW LAG COMPR KIT 85/80 (Screw) ×1 IMPLANT
SCREW TRIGEN LOW PROF 5.0X35 (Screw) ×1 IMPLANT
SPLINT FIBERGLASS 3X35 (CAST SUPPLIES) ×1 IMPLANT
SPONGE LAP 4X18 RFD (DISPOSABLE) IMPLANT
STAPLER VISISTAT 35W (STAPLE) ×2 IMPLANT
SUT ETHILON 4 0 PS 2 18 (SUTURE) ×2 IMPLANT
SUT MNCRL AB 4-0 PS2 18 (SUTURE) IMPLANT
SUT PROLENE 3 0 PS 2 (SUTURE) IMPLANT
SUT VIC AB 0 CT1 27 (SUTURE) ×2
SUT VIC AB 0 CT1 27XBRD ANBCTR (SUTURE) ×1 IMPLANT
SUT VIC AB 2-0 CT1 27 (SUTURE) ×2
SUT VIC AB 2-0 CT1 TAPERPNT 27 (SUTURE) ×1 IMPLANT
SUT VIC AB 3-0 FS2 27 (SUTURE) IMPLANT
SYR CONTROL 10ML LL (SYRINGE) IMPLANT
TOWEL GREEN STERILE (TOWEL DISPOSABLE) ×2 IMPLANT
TOWEL GREEN STERILE FF (TOWEL DISPOSABLE) ×2 IMPLANT
TUBE CONNECTING 12X1/4 (SUCTIONS) ×2 IMPLANT
UNDERPAD 30X36 HEAVY ABSORB (UNDERPADS AND DIAPERS) ×2 IMPLANT
WATER STERILE IRR 1000ML POUR (IV SOLUTION) ×2 IMPLANT

## 2020-04-09 NOTE — Progress Notes (Signed)
Refused to have her nares swabbed for pcr

## 2020-04-09 NOTE — Progress Notes (Signed)
[facility]                                                                              Patient Demographics  Lisa Castillo, is a 87 y.o. female, DOB - 11/15/1928, JXB:[id number]  Admit date - 04/07/2020   Admitting Physician Eben Burow, MD  Outpatient Primary MD for the patient is [name], MD  Outpatient specialists:   LOS - 2  days   Medical records reviewed and are as summarized below:    Chief Complaint  Patient presents with  . Fall       Brief summary   [name] a 87 year old female with history of HTN, hypothyroidism, dementia who presents by EMS after a fall at facility.   The patient is apparently in a wheelchair from a previous spinal surgery but is able to ambulate at times.  Patient has an alarm on her wheelchair and somehow she went outside and attempted to ambulate.  Patient stepped off of a curb and fell onto her left side.  She did hit her head, no report of loss of consciousness. Patient was found to have left distal radius fracture and left hip fracture on the x-rays.  Head CT and neck CT were negative for any acute fracture Orthopedics consulted. Left wrist fracture was splinted in ED.  2/9: Plan for left hip fracture repair today  Assessment & Plan     Principal Problem:   Closed left hip fracture, initial encounter [facility]) -Patient seen and examined, pain on moving. Daughter at the bedside -N.p.o., continue gentle hydration -Plan for surgery today -Continue pain control, bowel regimen, PT OT evaluation starting tomorrow  Active Problems:   HTN (hypertension) -BP stable, continue losartan, metoprolol      Unspecified fracture of the lower end of left radius -Currently in a splint.   -Will defer to orthopedics if patient needs surgery, close reduction versus ORIF.     Diabetes mellitus type 2 in nonobese (HCC), NIDDM -Continue to hold Amaryl. Continue sliding scale insulin  -CBGs currently controlled -Hemoglobin  A1c 5.5    Dementia (HCC) -Currently alert, has intermittent confusion, appears close to her baseline -Continue Depakote, Zoloft  Mechanical fall as cause of accidental injury in residential institution as place of occurrence   -PT OT evaluation after the surgery, patient is currently in a facility  Hypothyroidism -Continue Synthroid   Code Status: DNR status DVT Prophylaxis:  SCDs Start: 04/07/20 2254   Level of Care: Level of care: Med-Surg Family Communication: Discussed all imaging results, lab results, explained to the patient's daughter at the bedside  Disposition Plan:     Status is: Inpatient  Remains inpatient appropriate because:Inpatient level of care appropriate due to severity of illness   Dispo: The patient is from: SNF              Anticipated d/c is to: SNF              Anticipated d/c date is: > 3 days              Patient currently is not medically stable to d/c. Plan for  surgery  today   Difficult to place patient No      Time Spent in minutes 35 minutes  Procedures:  None  Consultants:   Orthopedics  Antimicrobials:   Anti-infectives (From admission, onward)   Start     Dose/Rate Route Frequency Ordered Stop   04/09/20 0600  ceFAZolin (ANCEF) IVPB 2g/100 mL premix        2 g 200 mL/hr over 30 Minutes Intravenous On call to O.R. 04/09/20 0327 04/10/20 0559         Medications  Scheduled Meds: . atorvastatin  10 mg Oral Daily  . chlorhexidine  60 mL Topical Once  . divalproex  125 mg Oral BID  . insulin aspart  0-5 Units Subcutaneous QHS  . insulin aspart  0-9 Units Subcutaneous TID WC  . levothyroxine  75 mcg Oral QAC breakfast  . losartan  25 mg Oral Daily  . metoprolol tartrate  12.5 mg Oral BID  . povidone-iodine  2 application Topical Once  . QUEtiapine  12.5 mg Oral QHS  . sertraline  50 mg Oral Daily   Continuous Infusions: .  ceFAZolin (ANCEF) IV    . lactated ringers 75 mL/hr (04/09/20 1203)   PRN  Meds:.acetaminophen, HYDROcodone-acetaminophen, HYDROmorphone (DILAUDID) injection, ondansetron (ZOFRAN) IV, senna-docusate      Subjective:   Lisa Castillo was seen and examined today. Has some intermittent confusion, daughter at the bedside. Left wrist in splint. Initially complained of dizziness but on raising head of the bed, felt better. On IV fluids. No chest pain or shortness of breath. No fevers, no ongoing nausea vomiting or diarrhea.   Objective:   Vitals:   04/09/20 0230 04/09/20 0234 04/09/20 0308 04/09/20 0809  BP: 138/83  (!) 156/85 136/73  Pulse: 94  98 (!) 102  Resp: (!) 27  18   Temp:  98.8 F (37.1 C) 98.5 F (36.9 C) 98.4 F (36.9 C)  TempSrc:  Oral Oral Oral  SpO2: 94%  94% 94%  Weight:      Height:       No intake or output data in the 24 hours ending 04/09/20 1304   Wt Readings from Last 3 Encounters:  04/07/20 63.5 kg  03/21/20 63.7 kg  03/20/20 63.7 kg    Physical Exam  General: Alert and oriented, NAD  Cardiovascular: S1 S2 clear, RRR. No pedal edema b/l  Respiratory: CTAB, no wheezing, rales or rhonchi  Gastrointestinal: Soft, nontender, nondistended, NBS  Ext: no pedal edema bilaterally, left wrist in splint  Neuro: no new deficits  Musculoskeletal: No cyanosis, clubbing  Skin: No rashes  Psych: appears slightly confused, possibly her baseline dementia     Data Reviewed:  I have personally reviewed following labs and imaging studies  Micro Results Recent Results (from the past 240 hour(s))  SARS CORONAVIRUS 2 (TAT 6-24 HRS) Nasopharyngeal Nasopharyngeal Swab     Status: None   Collection Time: 04/07/20  7:34 PM   Specimen: Nasopharyngeal Swab  Result Value Ref Range Status   SARS Coronavirus 2 NEGATIVE NEGATIVE Final    Comment: (NOTE) SARS-CoV-2 target nucleic acids are NOT DETECTED.  The SARS-CoV-2 RNA is generally detectable in upper and lower respiratory specimens during the acute phase of infection. Negative results  do not preclude SARS-CoV-2 infection, do not rule out co-infections with other pathogens, and should not be used as the sole basis for treatment or other patient management decisions. Negative results must be combined with clinical observations, patient history, and epidemiological  information. The expected result is Negative.  Fact Sheet for Patients: SugarRoll.be  Fact Sheet for Healthcare Providers: https://www.woods-mathews.com/  This test is not yet approved or cleared by the [place] FDA and  has been authorized for detection and/or diagnosis of SARS-CoV-2 by FDA under an Emergency Use Authorization (EUA). This EUA will remain  in effect (meaning this test can be used) for the duration of the COVID-19 declaration under Se ction 564(b)(1) of the Act, 21 U.S.C. section 360bbb-3(b)(1), unless the authorization is terminated or revoked sooner.  Performed at [facility], [place] [address] [place], [place] [address]     Radiology Reports DG Chest 1 View  Result Date: 04/07/2020 CLINICAL DATA:  Found down, dementia EXAM: CHEST  1 VIEW COMPARISON:  None. FINDINGS: Single frontal view of the chest demonstrates an unremarkable cardiac silhouette. No airspace disease, effusion, or pneumothorax. No acute bony abnormalities. IMPRESSION: 1. No acute intrathoracic process. Electronically Signed   By: Randa Ngo M.D.   On: 04/07/2020 20:24   DG Wrist Complete Left  Result Date: 04/07/2020 CLINICAL DATA:  Post reduction film of known left wrist fracture. EXAM: LEFT WRIST - COMPLETE 3+ VIEW COMPARISON:  None. FINDINGS: Splinting material obscures fine bony and soft tissue detail. Interval splinting with similar alignment of the displaced, impacted dorsally angulated fracture through the distal radius. IMPRESSION: Interval splinting with similar alignment of the distal radial fracture. Electronically Signed   By: Dahlia Bailiff MD   On:  04/07/2020 23:11   DG Wrist Complete Left  Result Date: 04/07/2020 CLINICAL DATA:  Pain EXAM: LEFT WRIST - COMPLETE 3+ VIEW COMPARISON:  None. FINDINGS: There is an acute, displaced and impacted fracture through the distal radius with significant dorsal angulation. There is surrounding soft tissue swelling. No frank dislocation. There is an old healed fracture of the fourth metacarpal. There are degenerative changes of the partially visualized interphalangeal joints. IMPRESSION: Acute, displaced, impacted fracture through the distal radius with significant dorsal angulation. Electronically Signed   By: Constance Holster M.D.   On: 04/07/2020 20:24   DG Ankle Complete Left  Result Date: 04/07/2020 CLINICAL DATA:  Pain EXAM: LEFT ANKLE COMPLETE - 3+ VIEW COMPARISON:  None. FINDINGS: There is no evidence of fracture, dislocation, or joint effusion. There is no evidence of arthropathy or other focal bone abnormality. Soft tissues are unremarkable. IMPRESSION: Negative. Electronically Signed   By: Constance Holster M.D.   On: 04/07/2020 20:25   CT HEAD WO CONTRAST  Result Date: 04/07/2020 CLINICAL DATA:  Facial trauma EXAM: CT HEAD WITHOUT CONTRAST TECHNIQUE: Contiguous axial images were obtained from the base of the skull through the vertex without intravenous contrast. COMPARISON:  None. FINDINGS: Brain: No evidence of acute territorial infarction, hemorrhage, hydrocephalus,extra-axial collection or mass lesion/mass effect. There is dilatation the ventricles and sulci consistent with age-related atrophy. Low-attenuation changes in the deep white matter consistent with small vessel ischemia. Vascular: No hyperdense vessel or unexpected calcification. Skull: The skull is intact. No fracture or focal lesion identified. Mild soft tissue swelling seen over the left frontal skull. Sinuses/Orbits: The visualized paranasal sinuses and mastoid air cells are clear. The orbits and globes intact. Other: None Cervical  spine: Alignment: Physiologic Skull base and vertebrae: Visualized skull base is intact. No atlanto-occipital dissociation. The vertebral body heights are well maintained. No fracture or pathologic osseous lesion seen. Soft tissues and spinal canal: The visualized paraspinal soft tissues are unremarkable. No prevertebral soft tissue swelling is seen. The spinal canal is  grossly unremarkable, no large epidural collection or significant canal narrowing. Disc levels: Multilevel cervical spine spondylosis seen with disc osteophyte complex and uncovertebral osteophytes most notable at C4-C5 and C5-C6. Upper chest: The lung apices are clear. Thoracic inlet is within normal limits. Other: None IMPRESSION: No acute intracranial abnormality. Findings consistent with age related atrophy and chronic small vessel ischemia No acute fracture or malalignment of the spine. Electronically Signed   By: Prudencio Pair M.D.   On: 04/07/2020 20:43   CT CERVICAL SPINE WO CONTRAST  Result Date: 04/07/2020 CLINICAL DATA:  Facial trauma EXAM: CT HEAD WITHOUT CONTRAST TECHNIQUE: Contiguous axial images were obtained from the base of the skull through the vertex without intravenous contrast. COMPARISON:  None. FINDINGS: Brain: No evidence of acute territorial infarction, hemorrhage, hydrocephalus,extra-axial collection or mass lesion/mass effect. There is dilatation the ventricles and sulci consistent with age-related atrophy. Low-attenuation changes in the deep white matter consistent with small vessel ischemia. Vascular: No hyperdense vessel or unexpected calcification. Skull: The skull is intact. No fracture or focal lesion identified. Mild soft tissue swelling seen over the left frontal skull. Sinuses/Orbits: The visualized paranasal sinuses and mastoid air cells are clear. The orbits and globes intact. Other: None Cervical spine: Alignment: Physiologic Skull base and vertebrae: Visualized skull base is intact. No atlanto-occipital  dissociation. The vertebral body heights are well maintained. No fracture or pathologic osseous lesion seen. Soft tissues and spinal canal: The visualized paraspinal soft tissues are unremarkable. No prevertebral soft tissue swelling is seen. The spinal canal is grossly unremarkable, no large epidural collection or significant canal narrowing. Disc levels: Multilevel cervical spine spondylosis seen with disc osteophyte complex and uncovertebral osteophytes most notable at C4-C5 and C5-C6. Upper chest: The lung apices are clear. Thoracic inlet is within normal limits. Other: None IMPRESSION: No acute intracranial abnormality. Findings consistent with age related atrophy and chronic small vessel ischemia No acute fracture or malalignment of the spine. Electronically Signed   By: Prudencio Pair M.D.   On: 04/07/2020 20:43   DG Hip Unilat With Pelvis 2-3 Views Left  Result Date: 04/07/2020 CLINICAL DATA:  Pain EXAM: DG HIP (WITH OR WITHOUT PELVIS) 2-3V LEFT COMPARISON:  None. FINDINGS: There is an acute, comminuted and displaced intratrochanteric fracture of the proximal left femur. There is no dislocation. There is osteopenia. There are moderate degenerative changes of both hips. IMPRESSION: Acute, comminuted and displaced intratrochanteric fracture of the proximal left femur. Electronically Signed   By: Constance Holster M.D.   On: 04/07/2020 20:24    Lab Data:  CBC: Recent Labs  Lab 04/07/20 1921 04/08/20 0410 04/09/20 0315  WBC 14.1* 12.8* 15.1*  NEUTROABS 10.0*  --   --   HGB 11.3* 10.6* 10.1*  HCT 35.1* 34.0* 30.8*  MCV 82.2 83.7 80.8  PLT 341 320 263   Basic Metabolic Panel: Recent Labs  Lab 04/07/20 1921 04/08/20 0410 04/09/20 0315  NA 139 139 138  K 4.1 4.5 3.9  CL 102 105 104  CO2 23 24 25   GLUCOSE 147* 171* 134*  BUN 27* 20 12  CREATININE 0.70 0.58 0.56  CALCIUM 8.7* 8.7* 8.8*   GFR: Estimated Creatinine Clearance: 39.7 mL/min (by C-G formula based on SCr of 0.56  mg/dL). Liver Function Tests: No results for input(s): AST, ALT, ALKPHOS, BILITOT, PROT, ALBUMIN in the last 168 hours. No results for input(s): LIPASE, AMYLASE in the last 168 hours. No results for input(s): AMMONIA in the last 168 hours. Coagulation Profile: Recent Labs  Lab 04/07/20 1921  INR 1.0   Cardiac Enzymes: No results for input(s): CKTOTAL, CKMB, CKMBINDEX, TROPONINI in the last 168 hours. BNP (last 3 results) No results for input(s): PROBNP in the last 8760 hours. HbA1C: Recent Labs    04/07/20 1921  HGBA1C 5.5   CBG: Recent Labs  Lab 04/08/20 0635 04/08/20 0812 04/08/20 1146 04/09/20 0047 04/09/20 0656  GLUCAP 131* 124* 111* 134* 135*   Lipid Profile: No results for input(s): CHOL, HDL, LDLCALC, TRIG, CHOLHDL, LDLDIRECT in the last 72 hours. Thyroid Function Tests: No results for input(s): TSH, T4TOTAL, FREET4, T3FREE, THYROIDAB in the last 72 hours. Anemia Panel: No results for input(s): VITAMINB12, FOLATE, FERRITIN, TIBC, IRON, RETICCTPCT in the last 72 hours. Urine analysis: No results found for: COLORURINE, APPEARANCEUR, LABSPEC, PHURINE, GLUCOSEU, HGBUR, BILIRUBINUR, KETONESUR, PROTEINUR, UROBILINOGEN, NITRITE, LEUKOCYTESUR   Lisa Castillo M.D. [facility] 04/09/2020, 1:04 PM   Call night coverage person covering after 7pm

## 2020-04-09 NOTE — Progress Notes (Signed)
Transition of Care [facility]) - CAGE-AID Screening   Patient Details  Name: Lisa Castillo MRN: [id number] Date of Birth: June 24, 1928    Verdie Shire, RN 04/09/2020, 9:32 AM   Clinical Narrative: Unable to complete screening due to patients history of dementia.    CAGE-AID Screening: Substance Abuse Screening unable to be completed due to: : Patient unable to participate (Pt has history of dementia.)

## 2020-04-09 NOTE — Discharge Instructions (Signed)
° ° °  1. Change dressings as needed °2. May shower but keep incisions covered and dry °3. Take lovenox to prevent blood clots °4. Take stool softeners as needed °5. Take pain meds as needed ° °

## 2020-04-09 NOTE — H&P (Signed)

## 2020-04-09 NOTE — ED Notes (Signed)
This RN called AC to inform him that charge nurse did not pick up the phone after it rang for several minutes. AC told this RN that he would call 5N charge nurse.

## 2020-04-09 NOTE — ED Notes (Addendum)
This RN called AC per previous unsuccessful attempts to call pt report. Given 5N charge nurse phone number to call for report.

## 2020-04-09 NOTE — ED Notes (Signed)
This RN attempted to call 5N for report twice. Each call, phone rang for several minutes with no answer.

## 2020-04-09 NOTE — ED Notes (Signed)
This RN attempted to call report to 5N for a third time. Phone rang for several minutes with no answer.

## 2020-04-09 NOTE — Anesthesia Preprocedure Evaluation (Addendum)
Anesthesia Evaluation  Patient identified by MRN, date of birth, ID band Patient awake    Reviewed: Allergy & Precautions, NPO status , Patient's Chart, lab work & pertinent test results, reviewed documented beta blocker date and time   Airway Mallampati: II  TM Distance: >3 FB Neck ROM: Full    Dental no notable dental hx. (+) Teeth Intact, Dental Advisory Given   Pulmonary neg pulmonary ROS,    Pulmonary exam normal breath sounds clear to auscultation       Cardiovascular hypertension, Pt. on medications and Pt. on home beta blockers Normal cardiovascular exam Rhythm:Regular Rate:Normal     Neuro/Psych PSYCHIATRIC DISORDERS Anxiety Depression Dementia negative neurological ROS     GI/Hepatic negative GI ROS, Neg liver ROS,   Endo/Other  diabetes, Well Controlled, Type 2, Oral Hypoglycemic AgentsHypothyroidism a1c 5.5  Renal/GU negative Renal ROS  negative genitourinary   Musculoskeletal L intertrochanteric hip fx, left distal radius fx    Abdominal   Peds  Hematology  (+) Blood dyscrasia, anemia , hct 30.8, plt 347   Anesthesia Other Findings   Reproductive/Obstetrics negative OB ROS                           Anesthesia Physical Anesthesia Plan  ASA: III  Anesthesia Plan: MAC and Spinal   Post-op Pain Management:    Induction:   PONV Risk Score and Plan: 2 and Propofol infusion, TIVA and Treatment may vary due to age or medical condition  Airway Management Planned: Natural Airway and Simple Face Mask  Additional Equipment: None  Intra-op Plan:   Post-operative Plan:   Informed Consent: I have reviewed the patients History and Physical, chart, labs and discussed the procedure including the risks, benefits and alternatives for the proposed anesthesia with the patient or authorized representative who has indicated his/her understanding and acceptance.       Plan Discussed  with: CRNA  Anesthesia Plan Comments:        Anesthesia Quick Evaluation

## 2020-04-09 NOTE — Transfer of Care (Signed)
Immediate Anesthesia Transfer of Care Note  Patient: Lisa Castillo  Procedure(s) Performed: LEFT INTERTROCHANTERIC INTRAMEDULLARY (IM) NAIL (Left ) CLOSED REDUCTION LEFT DISTAL RADIUS FRACTURE (Left )  Patient Location: PACU  Anesthesia Type:MAC and Spinal  Level of Consciousness: drowsy  Airway & Oxygen Therapy: Patient Spontanous Breathing and Patient connected to face mask oxygen  Post-op Assessment: Report given to RN and Post -op Vital signs reviewed and stable  Post vital signs: Reviewed and stable  Last Vitals:  Vitals Value Taken Time  BP 142/63 04/09/20 1700  Temp    Pulse 80 04/09/20 1701  Resp 18 04/09/20 1701  SpO2 100 % 04/09/20 1701  Vitals shown include unvalidated device data.  Last Pain:  Vitals:   04/09/20 1246  TempSrc:   PainSc: 2          Complications: No complications documented.

## 2020-04-09 NOTE — Anesthesia Procedure Notes (Signed)
Spinal  Patient location during procedure: OR Start time: 04/09/2020 3:30 PM End time: 04/09/2020 3:34 PM Staffing Performed: anesthesiologist  Anesthesiologist: Pervis Hocking, DO Preanesthetic Checklist Completed: patient identified, IV checked, risks and benefits discussed, surgical consent, monitors and equipment checked, pre-op evaluation and timeout performed Spinal Block Patient position: sitting Prep: DuraPrep and site prepped and draped Patient monitoring: cardiac monitor, continuous pulse ox and blood pressure Approach: midline Location: L3-4 Injection technique: single-shot Needle Needle type: Pencan  Needle gauge: 24 G Needle length: 9 cm Assessment Sensory level: T6 Additional Notes Functioning IV was confirmed and monitors were applied. Sterile prep and drape, including hand hygiene and sterile gloves were used. The patient was positioned and the spine was prepped. The skin was anesthetized with lidocaine.  Free flow of clear CSF was obtained prior to injecting local anesthetic into the CSF.  The spinal needle aspirated freely following injection.  The needle was carefully withdrawn.  The patient tolerated the procedure well.

## 2020-04-09 NOTE — ED Notes (Addendum)
This RN attempted to call 5N charge nurse phone for patient report. Phone rang for 5 minutes with no answer.

## 2020-04-09 NOTE — Op Note (Addendum)
Date of Surgery: 04/09/2020  INDICATIONS: Ms. Bulman is a 87 y.o.-year-old female who sustained a left intertrochanteric hip fracture and left distal radius fracture. The risks and benefits of the procedure discussed with the family prior to the procedure and all questions were answered; consent was obtained.  PREOPERATIVE DIAGNOSIS:  1.  Left intertrochanteric hip fracture  2.  Left distal radius fracture  POSTOPERATIVE DIAGNOSIS: Same   PROCEDURE:  1.  Open treatment of intertrochanteric fracture with intramedullary implant. CPT [phone]  2.  Closed reduction and manipulation of left distal radius fracture with application of sugar tong splint  SURGEON: N. Eduard Roux, M.D.   ASSIST: Madalyn Rob, [place]  ANESTHESIA: general   IV FLUIDS AND URINE: See anesthesia record   ESTIMATED BLOOD LOSS: 100 cc  IMPLANTS: [employer] InterTAN 10 x 36, 85/80 lag screws  DRAINS: None.   COMPLICATIONS: see description of procedure.   DESCRIPTION OF PROCEDURE: The patient was brought to the operating room and placed supine on the operating table. The patient's leg had been signed prior to the procedure. The patient had the anesthesia placed by the anesthesiologist. The prep verification and incision time-outs were performed to confirm that this was the correct patient, site, side and location. The patient had an SCD on the opposite lower extremity. The patient did receive antibiotics prior to the incision and was re-dosed during the procedure as needed at indicated intervals.  We first addressed the left distal radius fracture.  The distal radius fracture was reduced back into alignment and confirmed under fluoroscopy on orthogonal views.  A well-padded sugar tong splint with a volar mold was placed.  Post reduction fluoroscopy views were checked.  We then turned our attention to surgical repair of the hip fracture.  The patient was positioned on the fracture table with the table in  traction and internal rotation to reduce the hip. The well leg was placed in a scissor position and all bony prominences were well-padded. The patient had the lower extremity prepped and draped in the standard surgical fashion. The incision was made 4 finger breadths superior to the greater trochanter. A guide pin was inserted into the tip of the greater trochanter under fluoroscopic guidance. An opening reamer was used to gain access to the femoral canal. The nail length was measured and inserted down the femoral canal to its proper depth. The appropriate version of insertion for the lag screw was found under fluoroscopy. A pin was inserted up the femoral neck through the jig. Then, a second antirotation pin was inserted inferior to the first pin. The length of the lag screw was then measured. The lag screw was inserted as near to center-center in the head as possible. The antirotation pin was then taken out and an interdigitating compression screw was placed in its place. The leg was taken out of traction, then the interdigitating compression screw was used to compress across the fracture. Compression was visualized on serial xrays.  A distal interlocking screw was placed using the perfect circle technique.  The wound was copiously irrigated with saline and the subcutaneous layer closed with 2.0 vicryl and the skin was reapproximated with staples. The wounds were cleaned and dried a final time and a sterile dressing was placed. The hip was taken through a range of motion at the end of the case under fluoroscopic imaging to visualize the approach-withdraw phenomenon and confirm implant length in the head. The patient was then awakened from anesthesia and taken  to the recovery room in stable condition. All counts were correct at the end of the case.   Tawanna Cooler was necessary for opening, closing, retracting, limb positioning and overall facilitation and completion of the surgery.  POSTOPERATIVE PLAN: The  patient will be weight bearing as tolerated to the left lower extremity.  She will need to be nonweightbearing to the left upper extremity.  She will follow up in the office in 2 weeks for repeat x-rays of the hip and the left wrist in the splint.  Azucena Cecil, MD [facility] 4:40 PM

## 2020-04-10 ENCOUNTER — Encounter (HOSPITAL_COMMUNITY): Payer: Self-pay | Admitting: Orthopaedic Surgery

## 2020-04-10 LAB — BASIC METABOLIC PANEL
Anion gap: 11 (ref 5–15)
BUN: 12 mg/dL (ref 8–23)
CO2: 23 mmol/L (ref 22–32)
Calcium: 8.5 mg/dL — ABNORMAL LOW (ref 8.9–10.3)
Chloride: 104 mmol/L (ref 98–111)
Creatinine, Ser: 0.62 mg/dL (ref 0.44–1.00)
GFR, Estimated: >60 mL/min (ref >60)
Glucose, Bld: 150 mg/dL — ABNORMAL HIGH (ref 70–99)
Potassium: 3.9 mmol/L (ref 3.5–5.1)
Sodium: 138 mmol/L (ref 135–145)

## 2020-04-10 LAB — CBC
HCT: 28.2 % — ABNORMAL LOW (ref 36.0–46.0)
Hemoglobin: 8.9 g/dL — ABNORMAL LOW (ref 12.0–15.0)
MCH: 25.9 pg — ABNORMAL LOW (ref 26.0–34.0)
MCHC: 31.6 g/dL (ref 30.0–36.0)
MCV: 82.2 fL (ref 80.0–100.0)
Platelets: 303 10*3/uL (ref 150–400)
RBC: 3.43 MIL/uL — ABNORMAL LOW (ref 3.87–5.11)
RDW: 15.2 % (ref 11.5–15.5)
WBC: 13.3 10*3/uL — ABNORMAL HIGH (ref 4.0–10.5)
nRBC: 0 % (ref 0.0–0.2)

## 2020-04-10 LAB — GLUCOSE, CAPILLARY
Glucose-Capillary: 105 mg/dL — ABNORMAL HIGH (ref 70–99)
Glucose-Capillary: 107 mg/dL — ABNORMAL HIGH (ref 70–99)
Glucose-Capillary: 115 mg/dL — ABNORMAL HIGH (ref 70–99)
Glucose-Capillary: 110 mg/dL — ABNORMAL HIGH (ref 70–99)

## 2020-04-10 MED ORDER — TAMSULOSIN HCL 0.4 MG PO CAPS
0.4000 mg | ORAL_CAPSULE | Freq: Every day | ORAL | Status: DC
  Administered 2020-04-11 – 2020-04-14 (×4): 0.4 mg via ORAL
  Filled 2020-04-10 (×4): qty 1

## 2020-04-10 MED ORDER — OXYCODONE HCL 5 MG PO TABS
5.0000 mg | ORAL_TABLET | ORAL | Status: DC | PRN
Start: 2020-04-10 — End: 2020-04-15
  Administered 2020-04-10 – 2020-04-14 (×4): 5 mg via ORAL
  Filled 2020-04-10 (×4): qty 1

## 2020-04-10 MED ORDER — HYDROMORPHONE HCL 1 MG/ML IJ SOLN: 0.5000 mg | INTRAMUSCULAR | Status: DC | PRN

## 2020-04-10 NOTE — Progress Notes (Signed)
[facility]                                                                              Patient Demographics  Lisa Castillo, is a 87 y.o. female, DOB - 21-Sep-1928, DVV:[id number]  Admit date - 04/07/2020   Admitting Physician Eben Burow, MD  Outpatient Primary MD for the patient is [name], MD  Outpatient specialists:   LOS - 3  days   Medical records reviewed and are as summarized below:    Chief Complaint  Patient presents with  . Fall       Brief summary   [name] a 87 year old female with history of HTN, hypothyroidism, dementia who presents by EMS after a fall at facility.   The patient is apparently in a wheelchair from a previous spinal surgery but is able to ambulate at times.  Patient has an alarm on her wheelchair and somehow she went outside and attempted to ambulate.  Patient stepped off of a curb and fell onto her left side.  She did hit her head, no report of loss of consciousness. Patient was found to have left distal radius fracture and left hip fracture on the x-rays.  Head CT and neck CT were negative for any acute fracture Orthopedics consulted. Left wrist fracture was splinted in ED.  2/9: Plan for left hip fracture repair today  Assessment & Plan     Principal Problem:   Closed left hip fracture, initial encounter [facility]) -Orthopedics was consulted, underwent ORIF of intertrochanteric fracture on 2/9, postop day #1 -Somewhat lethargic and somnolent today, discontinued Seroquel, Depakote, higher dose oxycodone, decreased IV Dilaudid -Start PT OT as tolerated, continue bowel regimen   Active Problems:   HTN (hypertension) -BP currently stable, continue metoprolol, losartan     Unspecified fracture of the lower end of left radius -.  Underwent closed reduction of the left distal fracture with splint, postop day #1 -Currently in sling    Diabetes mellitus type 2 in nonobese ([place]), NIDDM -Continue to hold Amaryl.   -CBGs currently controlled, continue SSI - Hemoglobin A1c 5.5    Dementia (HCC) -Currently somnolent and lethargic, continue Zoloft -Per daughter, Depakote was very recently started, will hold off on Seroquel and Depakote given patient is very somnolent  Mechanical fall as cause of accidental injury in residential institution as place of occurrence   -PT OT evaluation  Hypothyroidism -Continue Synthroid   Code Status: DNR status DVT Prophylaxis:  enoxaparin (LOVENOX) injection 40 mg Start: 04/10/20 1700 SCDs Start: 04/09/20 2019 SCDs Start: 04/07/20 2254   Level of Care: Level of care: Med-Surg Family Communication: Discussed all imaging results, lab results, explained to the patient's daughter at the bedside on 2/9 and 2/10 today  Disposition Plan:     Status is: Inpatient  Remains inpatient appropriate because:Inpatient level of care appropriate due to severity of illness   Dispo: The patient is from: SNF              Anticipated d/c is to: SNF              Anticipated d/c date is: >  3 days              Patient currently is not medically stable to d/c.  Very somnolent today, will start PT as visible with her mental status and able to tolerate   Difficult to place patient No      Time Spent in minutes 35 minutes  Procedures:    Consultants:   Orthopedics  Antimicrobials:   Anti-infectives (From admission, onward)   Start     Dose/Rate Route Frequency Ordered Stop   04/09/20 2200  ceFAZolin (ANCEF) IVPB 2g/100 mL premix        2 g 200 mL/hr over 30 Minutes Intravenous Every 6 hours 04/09/20 2018 04/10/20 1016   04/09/20 0600  ceFAZolin (ANCEF) IVPB 2g/100 mL premix        2 g 200 mL/hr over 30 Minutes Intravenous On call to O.R. 04/09/20 0327 04/09/20 1537         Medications  Scheduled Meds: . acetaminophen  1,000 mg Oral Q6H  . atorvastatin  10 mg Oral Daily  . Chlorhexidine Gluconate Cloth  6 each Topical Daily  . docusate sodium  100 mg Oral  BID  . enoxaparin (LOVENOX) injection  40 mg Subcutaneous Q24H  . insulin aspart  0-5 Units Subcutaneous QHS  . insulin aspart  0-9 Units Subcutaneous TID WC  . levothyroxine  75 mcg Oral QAC breakfast  . losartan  25 mg Oral Daily  . metoprolol tartrate  12.5 mg Oral BID  . sertraline  50 mg Oral Daily   Continuous Infusions: . sodium chloride 75 mL/hr at 04/09/20 2026  . lactated ringers Stopped (04/09/20 1700)  . methocarbamol (ROBAXIN) IV     PRN Meds:.acetaminophen, alum & mag hydroxide-simeth, HYDROmorphone (DILAUDID) injection, magnesium citrate, menthol-cetylpyridinium **OR** phenol, methocarbamol **OR** methocarbamol (ROBAXIN) IV, ondansetron **OR** ondansetron (ZOFRAN) IV, oxyCODONE, polyethylene glycol, senna-docusate, sorbitol      Subjective:   [name] was seen and examined today.  Somnolent and lethargic today however towards the end of the encounter, became alert and oriented.  No acute complaints by the patient.  No chest pain or shortness of breath. No fevers, no ongoing nausea vomiting or diarrhea.   Objective:   Vitals:   04/09/20 2230 04/10/20 0259 04/10/20 0411 04/10/20 0802  BP: (!) 149/70 (!) 141/66 (!) 111/54 (!) 136/57  Pulse: 90 (!) 101 79 88  Resp: 17 15 16 17   Temp: 98 F (36.7 C) 99.3 F (37.4 C) (!) 97.5 F (36.4 C) (!) 97.4 F (36.3 C)  TempSrc: Oral Axillary Axillary Oral  SpO2: 99% 98% 98% 100%  Weight:      Height:        Intake/Output Summary (Last 24 hours) at 04/10/2020 1423 Last data filed at 04/10/2020 0411 Gross per 24 hour  Intake 2472.14 ml  Output 950 ml  Net 1522.14 ml     Wt Readings from Last 3 Encounters:  04/09/20 63.5 kg  03/21/20 63.7 kg  03/20/20 63.7 kg   Physical Exam  General: Initially somnolent, subsequently became more alert  Cardiovascular: S1 S2 clear, RRR. No pedal edema b/l  Respiratory: CTAB, no wheezing, rales or rhonchi  Gastrointestinal: Soft, nontender, nondistended, NBS  Ext: no  pedal edema bilaterally, left arm in sling  Neuro: able to wiggle toes  Musculoskeletal: No cyanosis, clubbing  Skin: No rashes  Psych: initially somnolent and lethargic however subsequently became more alert    Data Reviewed:  I have personally reviewed following labs and imaging  studies  Micro Results Recent Results (from the past 240 hour(s))  SARS CORONAVIRUS 2 (TAT 6-24 HRS) Nasopharyngeal Nasopharyngeal Swab     Status: None   Collection Time: 04/07/20  7:34 PM   Specimen: Nasopharyngeal Swab  Result Value Ref Range Status   SARS Coronavirus 2 NEGATIVE NEGATIVE Final    Comment: (NOTE) SARS-CoV-2 target nucleic acids are NOT DETECTED.  The SARS-CoV-2 RNA is generally detectable in upper and lower respiratory specimens during the acute phase of infection. Negative results do not preclude SARS-CoV-2 infection, do not rule out co-infections with other pathogens, and should not be used as the sole basis for treatment or other patient management decisions. Negative results must be combined with clinical observations, patient history, and epidemiological information. The expected result is Negative.  Fact Sheet for Patients: SugarRoll.be  Fact Sheet for Healthcare Providers: https://www.woods-mathews.com/  This test is not yet approved or cleared by the [place] FDA and  has been authorized for detection and/or diagnosis of SARS-CoV-2 by FDA under an Emergency Use Authorization (EUA). This EUA will remain  in effect (meaning this test can be used) for the duration of the COVID-19 declaration under Se ction 564(b)(1) of the Act, 21 U.S.C. section 360bbb-3(b)(1), unless the authorization is terminated or revoked sooner.  Performed at [facility], [place] [address], [place], [place] [address]   MRSA PCR Screening     Status: None   Collection Time: 04/09/20 12:20 PM   Specimen: Nasal Mucosa; Nasopharyngeal  Result  Value Ref Range Status   MRSA by PCR NEGATIVE NEGATIVE Final    Comment:        The GeneXpert MRSA Assay (FDA approved for NASAL specimens only), is one component of a comprehensive MRSA colonization surveillance program. It is not intended to diagnose MRSA infection nor to guide or monitor treatment for MRSA infections. Performed at [facility], [place] [address] [place], [place] [address]     Radiology Reports DG Chest 1 View  Result Date: 04/07/2020 CLINICAL DATA:  Found down, dementia EXAM: CHEST  1 VIEW COMPARISON:  None. FINDINGS: Single frontal view of the chest demonstrates an unremarkable cardiac silhouette. No airspace disease, effusion, or pneumothorax. No acute bony abnormalities. IMPRESSION: 1. No acute intrathoracic process. Electronically Signed   By: Randa Ngo M.D.   On: 04/07/2020 20:24   DG Wrist Complete Left  Result Date: 04/09/2020 CLINICAL DATA:  Close reduction left wrist fracture EXAM: LEFT WRIST - COMPLETE 3+ VIEW COMPARISON:  04/07/2020 FINDINGS: Two fluoroscopic images are obtained during the performance of the procedure and are provided for interpretation only. There has been interval reduction of the comminuted distal left radial fracture seen previously, now with near anatomic alignment. Please refer to the operative report. FLUOROSCOPY TIME:  8 seconds IMPRESSION: 1. Reduction of distal left radial fracture with near anatomic alignment. Electronically Signed   By: Randa Ngo M.D.   On: 04/09/2020 18:07   DG Wrist Complete Left  Result Date: 04/07/2020 CLINICAL DATA:  Post reduction film of known left wrist fracture. EXAM: LEFT WRIST - COMPLETE 3+ VIEW COMPARISON:  None. FINDINGS: Splinting material obscures fine bony and soft tissue detail. Interval splinting with similar alignment of the displaced, impacted dorsally angulated fracture through the distal radius. IMPRESSION: Interval splinting with similar alignment of the distal radial fracture.  Electronically Signed   By: Dahlia Bailiff MD   On: 04/07/2020 23:11   DG Wrist Complete Left  Result Date: 04/07/2020 CLINICAL DATA:  Pain EXAM: LEFT WRIST - COMPLETE 3+ VIEW COMPARISON:  None. FINDINGS: There is an acute, displaced and impacted fracture through the distal radius with significant dorsal angulation. There is surrounding soft tissue swelling. No frank dislocation. There is an old healed fracture of the fourth metacarpal. There are degenerative changes of the partially visualized interphalangeal joints. IMPRESSION: Acute, displaced, impacted fracture through the distal radius with significant dorsal angulation. Electronically Signed   By: Constance Holster M.D.   On: 04/07/2020 20:24   DG Ankle Complete Left  Result Date: 04/07/2020 CLINICAL DATA:  Pain EXAM: LEFT ANKLE COMPLETE - 3+ VIEW COMPARISON:  None. FINDINGS: There is no evidence of fracture, dislocation, or joint effusion. There is no evidence of arthropathy or other focal bone abnormality. Soft tissues are unremarkable. IMPRESSION: Negative. Electronically Signed   By: Constance Holster M.D.   On: 04/07/2020 20:25   CT HEAD WO CONTRAST  Result Date: 04/07/2020 CLINICAL DATA:  Facial trauma EXAM: CT HEAD WITHOUT CONTRAST TECHNIQUE: Contiguous axial images were obtained from the base of the skull through the vertex without intravenous contrast. COMPARISON:  None. FINDINGS: Brain: No evidence of acute territorial infarction, hemorrhage, hydrocephalus,extra-axial collection or mass lesion/mass effect. There is dilatation the ventricles and sulci consistent with age-related atrophy. Low-attenuation changes in the deep white matter consistent with small vessel ischemia. Vascular: No hyperdense vessel or unexpected calcification. Skull: The skull is intact. No fracture or focal lesion identified. Mild soft tissue swelling seen over the left frontal skull. Sinuses/Orbits: The visualized paranasal sinuses and mastoid air cells are clear.  The orbits and globes intact. Other: None Cervical spine: Alignment: Physiologic Skull base and vertebrae: Visualized skull base is intact. No atlanto-occipital dissociation. The vertebral body heights are well maintained. No fracture or pathologic osseous lesion seen. Soft tissues and spinal canal: The visualized paraspinal soft tissues are unremarkable. No prevertebral soft tissue swelling is seen. The spinal canal is grossly unremarkable, no large epidural collection or significant canal narrowing. Disc levels: Multilevel cervical spine spondylosis seen with disc osteophyte complex and uncovertebral osteophytes most notable at C4-C5 and C5-C6. Upper chest: The lung apices are clear. Thoracic inlet is within normal limits. Other: None IMPRESSION: No acute intracranial abnormality. Findings consistent with age related atrophy and chronic small vessel ischemia No acute fracture or malalignment of the spine. Electronically Signed   By: Prudencio Pair M.D.   On: 04/07/2020 20:43   CT CERVICAL SPINE WO CONTRAST  Result Date: 04/07/2020 CLINICAL DATA:  Facial trauma EXAM: CT HEAD WITHOUT CONTRAST TECHNIQUE: Contiguous axial images were obtained from the base of the skull through the vertex without intravenous contrast. COMPARISON:  None. FINDINGS: Brain: No evidence of acute territorial infarction, hemorrhage, hydrocephalus,extra-axial collection or mass lesion/mass effect. There is dilatation the ventricles and sulci consistent with age-related atrophy. Low-attenuation changes in the deep white matter consistent with small vessel ischemia. Vascular: No hyperdense vessel or unexpected calcification. Skull: The skull is intact. No fracture or focal lesion identified. Mild soft tissue swelling seen over the left frontal skull. Sinuses/Orbits: The visualized paranasal sinuses and mastoid air cells are clear. The orbits and globes intact. Other: None Cervical spine: Alignment: Physiologic Skull base and vertebrae:  Visualized skull base is intact. No atlanto-occipital dissociation. The vertebral body heights are well maintained. No fracture or pathologic osseous lesion seen. Soft tissues and spinal canal: The visualized paraspinal soft tissues are unremarkable. No prevertebral soft tissue swelling is seen. The spinal canal is grossly unremarkable, no large epidural collection or significant  canal narrowing. Disc levels: Multilevel cervical spine spondylosis seen with disc osteophyte complex and uncovertebral osteophytes most notable at C4-C5 and C5-C6. Upper chest: The lung apices are clear. Thoracic inlet is within normal limits. Other: None IMPRESSION: No acute intracranial abnormality. Findings consistent with age related atrophy and chronic small vessel ischemia No acute fracture or malalignment of the spine. Electronically Signed   By: Prudencio Pair M.D.   On: 04/07/2020 20:43   DG C-Arm 1-60 Min  Result Date: 04/09/2020 CLINICAL DATA:  ORIF left femur. EXAM: DG C-ARM 1-60 MIN; LEFT FEMUR 2 VIEWS FLUOROSCOPY TIME:  Fluoroscopy Time:  1 minutes 41 seconds Reported radiation: 17 mGy. COMPARISON:  April 07, 2020 FINDINGS: Four C-arm fluoroscopic images were obtained intraoperatively and submitted for post operative interpretation. These images demonstrate fixation of an inter trochanteric fracture of the left femur with intramedullary nail and screws. Improved, near anatomic alignment. No unexpected findings. Please see the performing provider's procedural report for further detail. IMPRESSION: Intraoperative fluoroscopic imaging, as detailed above. Electronically Signed   By: Margaretha Sheffield MD   On: 04/09/2020 17:10   DG Hip Unilat With Pelvis 2-3 Views Left  Result Date: 04/07/2020 CLINICAL DATA:  Pain EXAM: DG HIP (WITH OR WITHOUT PELVIS) 2-3V LEFT COMPARISON:  None. FINDINGS: There is an acute, comminuted and displaced intratrochanteric fracture of the proximal left femur. There is no dislocation. There is  osteopenia. There are moderate degenerative changes of both hips. IMPRESSION: Acute, comminuted and displaced intratrochanteric fracture of the proximal left femur. Electronically Signed   By: Constance Holster M.D.   On: 04/07/2020 20:24   DG FEMUR MIN 2 VIEWS LEFT  Result Date: 04/09/2020 CLINICAL DATA:  ORIF left femur. EXAM: DG C-ARM 1-60 MIN; LEFT FEMUR 2 VIEWS FLUOROSCOPY TIME:  Fluoroscopy Time:  1 minutes 41 seconds Reported radiation: 17 mGy. COMPARISON:  April 07, 2020 FINDINGS: Four C-arm fluoroscopic images were obtained intraoperatively and submitted for post operative interpretation. These images demonstrate fixation of an inter trochanteric fracture of the left femur with intramedullary nail and screws. Improved, near anatomic alignment. No unexpected findings. Please see the performing provider's procedural report for further detail. IMPRESSION: Intraoperative fluoroscopic imaging, as detailed above. Electronically Signed   By: Margaretha Sheffield MD   On: 04/09/2020 17:10    Lab Data:  CBC: Recent Labs  Lab 04/07/20 1921 04/08/20 0410 04/09/20 0315 04/09/20 2035 04/10/20 0155  WBC 14.1* 12.8* 15.1* 15.2* 13.3*  NEUTROABS 10.0*  --   --   --   --   HGB 11.3* 10.6* 10.1* 9.8* 8.9*  HCT 35.1* 34.0* 30.8* 30.5* 28.2*  MCV 82.2 83.7 80.8 82.0 82.2  PLT 341 320 347 310 010   Basic Metabolic Panel: Recent Labs  Lab 04/07/20 1921 04/08/20 0410 04/09/20 0315 04/09/20 2035 04/10/20 0155  NA 139 139 138  --  138  K 4.1 4.5 3.9  --  3.9  CL 102 105 104  --  104  CO2 23 24 25   --  23  GLUCOSE 147* 171* 134*  --  150*  BUN 27* 20 12  --  12  CREATININE 0.70 0.58 0.56 0.65 0.62  CALCIUM 8.7* 8.7* 8.8*  --  8.5*   GFR: Estimated Creatinine Clearance: 39.7 mL/min (by C-G formula based on SCr of 0.62 mg/dL). Liver Function Tests: No results for input(s): AST, ALT, ALKPHOS, BILITOT, PROT, ALBUMIN in the last 168 hours. No results for input(s): LIPASE, AMYLASE in the last  168 hours. No  results for input(s): AMMONIA in the last 168 hours. Coagulation Profile: Recent Labs  Lab 04/07/20 1921  INR 1.0   Cardiac Enzymes: No results for input(s): CKTOTAL, CKMB, CKMBINDEX, TROPONINI in the last 168 hours. BNP (last 3 results) No results for input(s): PROBNP in the last 8760 hours. HbA1C: Recent Labs    04/07/20 1921  HGBA1C 5.5   CBG: Recent Labs  Lab 04/09/20 1454 04/09/20 1754 04/09/20 2258 04/10/20 0658 04/10/20 1208  GLUCAP 112* 126* 179* 105* 107*   Lipid Profile: No results for input(s): CHOL, HDL, LDLCALC, TRIG, CHOLHDL, LDLDIRECT in the last 72 hours. Thyroid Function Tests: No results for input(s): TSH, T4TOTAL, FREET4, T3FREE, THYROIDAB in the last 72 hours. Anemia Panel: No results for input(s): VITAMINB12, FOLATE, FERRITIN, TIBC, IRON, RETICCTPCT in the last 72 hours. Urine analysis: No results found for: COLORURINE, APPEARANCEUR, LABSPEC, PHURINE, GLUCOSEU, HGBUR, BILIRUBINUR, KETONESUR, PROTEINUR, UROBILINOGEN, NITRITE, LEUKOCYTESUR   Bowie Delia M.D. [facility] 04/10/2020, 2:23 PM   Call night coverage person covering after 7pm

## 2020-04-10 NOTE — Progress Notes (Signed)
Subjective: 1 Day Post-Op Procedure(s) (LRB): LEFT INTERTROCHANTERIC INTRAMEDULLARY (IM) NAIL (Left) CLOSED REDUCTION LEFT DISTAL RADIUS FRACTURE (Left) Patient reports pain as mild.  Resting comfortably  Objective: Vital signs in last 24 hours: Temp:  [97 F (36.1 C)-99.3 F (37.4 C)] 97.5 F (36.4 C) (02/10 0411) Pulse Rate:  [79-102] 79 (02/10 0411) Resp:  [14-18] 16 (02/10 0411) BP: (111-178)/(54-73) 111/54 (02/10 0411) SpO2:  [90 %-100 %] 98 % (02/10 0411) Weight:  [63.5 kg] 63.5 kg (02/09 1450)  Intake/Output from previous day: 02/09 0701 - 02/10 0700 In: 3112.1 [P.O.:120; I.V.:2942.1] Out: 950 [Urine:900; Blood:50] Intake/Output this shift: No intake/output data recorded.  Recent Labs    04/07/20 1921 04/08/20 0410 04/09/20 0315 04/09/20 2035 04/10/20 0155  HGB 11.3* 10.6* 10.1* 9.8* 8.9*   Recent Labs    04/09/20 2035 04/10/20 0155  WBC 15.2* 13.3*  RBC 3.72* 3.43*  HCT 30.5* 28.2*  PLT 310 303   Recent Labs    04/09/20 0315 04/09/20 2035 04/10/20 0155  NA 138  --  138  K 3.9  --  3.9  CL 104  --  104  CO2 25  --  23  BUN 12  --  12  CREATININE 0.56 0.65 0.62  GLUCOSE 134*  --  150*  CALCIUM 8.8*  --  8.5*   Recent Labs    04/07/20 1921  INR 1.0    Neurovascular intact Incision: dressing C/D/I No cellulitis present Compartment soft  Well fitting sling in place to LUE   Assessment/Plan: 1 Day Post-Op Procedure(s) (LRB): LEFT INTERTROCHANTERIC INTRAMEDULLARY (IM) NAIL (Left) CLOSED REDUCTION LEFT DISTAL RADIUS FRACTURE (Left) Up with therapy LUE- NWB.  Continue with sling.  Continue to elevate for pain and swelling LLE- WBAT.  Ice as needed for pain and swelling Lovenox for dvt ppx F/u with Dr. Erlinda Hong 2 weeks post-op     Aundra Dubin 04/10/2020, 7:56 AM

## 2020-04-10 NOTE — Anesthesia Postprocedure Evaluation (Signed)
Anesthesia Post Note  Patient: Lisa Castillo  Procedure(s) Performed: LEFT INTERTROCHANTERIC INTRAMEDULLARY (IM) NAIL (Left ) CLOSED REDUCTION LEFT DISTAL RADIUS FRACTURE (Left )     Patient location during evaluation: PACU Anesthesia Type: MAC and Spinal Level of consciousness: oriented and awake and alert Pain management: pain level controlled Vital Signs Assessment: post-procedure vital signs reviewed and stable Respiratory status: spontaneous breathing, respiratory function stable and patient connected to nasal cannula oxygen Cardiovascular status: blood pressure returned to baseline and stable Postop Assessment: no headache, no backache, no apparent nausea or vomiting and spinal receding Anesthetic complications: no   No complications documented.  Last Vitals:  Vitals:   04/10/20 0259 04/10/20 0411  BP: (!) 141/66 (!) 111/54  Pulse: (!) 101 79  Resp: 15 16  Temp: 37.4 C (!) 36.4 C  SpO2: 98% 98%    Last Pain:  Vitals:   04/10/20 0411  TempSrc: Axillary  PainSc:                  Pervis Hocking

## 2020-04-10 NOTE — NC FL2 (Signed)
[place] LEVEL OF CARE SCREENING TOOL     IDENTIFICATION  Patient Name: Lisa Castillo Birthdate: Nov 19, 1928 Sex: female Admission Date (Current Location): 04/07/2020  [facility] and [place] Number:  Herbalist and Address:  [facility] [place], [place] [address] [place], [place] [address]      Provider Number: [id number]  Attending Physician Name and Address:  Mendel Corning, MD  Relative Name and Phone Number:       Current Level of Care: Hospital Recommended Level of Care: [place] Prior Approval Number:    Date Approved/Denied:   PASRR Number:    Discharge Plan: SNF    Current Diagnoses: Patient Active Problem List   Diagnosis Date Noted  . Closed left hip fracture, initial encounter ([place]) 04/07/2020  . Unspecified fracture of the lower end of left radius, initial encounter for closed fracture 04/07/2020  . Diabetes mellitus type 2 in nonobese ([place]) 04/07/2020  . Dementia ([place]) 04/07/2020  . Fall as cause of accidental injury in residential institution as place of occurrence 04/07/2020  . Closed left hip fracture ([employer]) 04/07/2020  . Frequent falls 02/25/2020  . HTN (hypertension) 02/04/2020  . Hypokalemia 01/21/2020  . Rectal bleed 01/18/2020  . Right lower lobe pneumonia 01/14/2020  . Depression with anxiety 12/31/2019  . Left tubo-ovarian mass 12/31/2019  . Slow transit constipation 12/31/2019  . Burst fracture of lumbar vertebra, sequela 12/27/2019  . Senile dementia, delirium ([place]) 04/03/2019  . Tachycardia 04/03/2019  . Osteopenia 06/14/2018  . Diabetes mellitus type 2 in obese ([place]) 11/09/2017  . Obesity (BMI 30-39.9) 01/19/2017  . Mixed hyperlipidemia 06/29/2016  . Gait abnormality 04/27/2016  . Hearing loss   . Hypothyroidism   . Insomnia     Orientation RESPIRATION BLADDER Height & Weight     Self (Hx of dementia)  O2 External catheter Weight: 63.5 kg Height:  5' (152.4 cm)  BEHAVIORAL  SYMPTOMS/MOOD NEUROLOGICAL BOWEL NUTRITION STATUS      Continent Diet (refer to d/c summary)  AMBULATORY STATUS COMMUNICATION OF NEEDS Skin   Extensive Assist Verbally Surgical wounds (2/9-  s/p L INTERTROCHANTERIC  (IM) NAIL (Left),  CLOSED REDUCTION LEFT DISTAL RADIUS FRACTURE (Left))                       Personal Care Assistance Level of Assistance  Bathing,Feeding,Dressing Bathing Assistance: Maximum assistance Feeding assistance: Limited assistance Dressing Assistance: Maximum assistance     Functional Limitations Info  Sight,Hearing,Speech Sight Info: Adequate Hearing Info: Adequate Speech Info: Adequate    SPECIAL CARE FACTORS FREQUENCY  PT (By licensed PT),OT (By licensed OT)     PT Frequency: 5x/week, evaluate and treat OT Frequency: 5x/week, evaluate and treat            Contractures Contractures Info: Not present    Additional Factors Info  Code Status,Allergies,Insulin Sliding Scale Code Status Info: DNR Allergies Info: Metformin And Related           Current Medications (04/10/2020):  This is the current hospital active medication list Current Facility-Administered Medications  Medication Dose Route Frequency Provider Last Rate Last Admin  . 0.9 %  sodium chloride infusion   Intravenous Continuous Leandrew Koyanagi, MD 75 mL/hr at 04/09/20 2026 New Bag at 04/09/20 2026  . acetaminophen (TYLENOL) tablet 1,000 mg  1,000 mg Oral Q6H Leandrew Koyanagi, MD   1,000 mg at 04/10/20 0057  . acetaminophen (TYLENOL) tablet 325-650 mg  325-650 mg Oral Q6H PRN Leandrew Koyanagi, MD      . alum & mag hydroxide-simeth (MAALOX/MYLANTA) 200-200-20 MG/5ML suspension 30 mL  30 mL Oral Q4H PRN Leandrew Koyanagi, MD      . atorvastatin (LIPITOR) tablet 10 mg  10 mg Oral Daily Leandrew Koyanagi, MD   10 mg at 04/10/20 0943  . Chlorhexidine Gluconate Cloth 2 % PADS 6 each  6 each Topical Daily Rai, Ripudeep K, MD   6 each at 04/10/20 0946  . docusate sodium (COLACE) capsule 100 mg  100 mg  Oral BID Leandrew Koyanagi, MD   100 mg at 04/10/20 1027  . enoxaparin (LOVENOX) injection 40 mg  40 mg Subcutaneous Q24H Leandrew Koyanagi, MD      . HYDROmorphone (DILAUDID) injection 0.5 mg  0.5 mg Intravenous Q4H PRN Rai, Ripudeep K, MD      . insulin aspart (novoLOG) injection 0-5 Units  0-5 Units Subcutaneous QHS Leandrew Koyanagi, MD   1 Units at 04/09/20 0047  . insulin aspart (novoLOG) injection 0-9 Units  0-9 Units Subcutaneous TID WC Leandrew Koyanagi, MD   1 Units at 04/09/20 0810  . lactated ringers infusion   Intravenous Continuous Leandrew Koyanagi, MD   Stopped at 04/09/20 1700  . levothyroxine (SYNTHROID) tablet 75 mcg  75 mcg Oral QAC breakfast Leandrew Koyanagi, MD   75 mcg at 04/08/20 2536  . losartan (COZAAR) tablet 25 mg  25 mg Oral Daily Leandrew Koyanagi, MD   25 mg at 04/10/20 0944  . magnesium citrate solution 1 Bottle  1 Bottle Oral Once PRN Leandrew Koyanagi, MD      . menthol-cetylpyridinium (CEPACOL) lozenge 3 mg  1 lozenge Oral PRN Leandrew Koyanagi, MD       Or  . phenol (CHLORASEPTIC) mouth spray 1 spray  1 spray Mouth/Throat PRN Leandrew Koyanagi, MD      . methocarbamol (ROBAXIN) tablet 500 mg  500 mg Oral Q6H PRN Leandrew Koyanagi, MD       Or  . methocarbamol (ROBAXIN) 500 mg in dextrose 5 % 50 mL IVPB  500 mg Intravenous Q6H PRN Leandrew Koyanagi, MD      . metoprolol tartrate (LOPRESSOR) tablet 12.5 mg  12.5 mg Oral BID Leandrew Koyanagi, MD   12.5 mg at 04/10/20 0943  . ondansetron (ZOFRAN) tablet 4 mg  4 mg Oral Q6H PRN Leandrew Koyanagi, MD       Or  . ondansetron [facility]) injection 4 mg  4 mg Intravenous Q6H PRN Leandrew Koyanagi, MD      . oxyCODONE (Oxy IR/ROXICODONE) immediate release tablet 5 mg  5 mg Oral Q4H PRN Rai, Ripudeep K, MD      . polyethylene glycol (MIRALAX / GLYCOLAX) packet 17 g  17 g Oral Daily PRN Leandrew Koyanagi, MD      . senna-docusate (Senokot-S) tablet 1 tablet  1 tablet Oral QHS PRN Leandrew Koyanagi, MD      . sertraline (ZOLOFT) tablet 50 mg  50 mg Oral Daily Leandrew Koyanagi, MD   50 mg at  04/10/20 0943  . sorbitol 70 % solution 30 mL  30 mL Oral Daily PRN Leandrew Koyanagi, MD         Discharge Medications: Please see discharge summary for a list of discharge medications.  Relevant Imaging Results:  Relevant Lab Results:   Additional Information SS# [id number]  Sharin Mons, RN

## 2020-04-10 NOTE — Evaluation (Signed)
Physical Therapy Evaluation Patient Details Name: Lisa Castillo MRN: [id number] DOB: March 23, 1928 Today's Date: 04/10/2020   History of Present Illness  87 y.o. female with medical history significant for HTN, hypothyroidism, dementia who presents by EMS after an unwitnessed fall at facility. Patient sustained L femur fx and L distal radius fx. Patient s/p IM nail L femur and closed reduction of L distal radius on 2/9.  Clinical Impression  PTA, patient was in rehab at [facility] and required +1 assist with RW. Patient presents with generalized weakness, impaired balance, decreased activity tolerance, and impaired functional mobility. Hx of dementia at baseline. Patient requires heavy +2 assistance for bed mobility and sit to stand transfers. Per MD, completely NWB on L UE. Patient will benefit from skilled PT services during acute stay to address listed deficits. Recommend return to SNF following d/c to maximize functional mobility.     Follow Up Recommendations SNF (return to SNF)    Equipment Recommendations  None recommended by PT    Recommendations for Other Services       Precautions / Restrictions Precautions Precautions: Fall Required Braces or Orthoses: Splint/Cast Splint/Cast: L forearm/elbow Restrictions Weight Bearing Restrictions: Yes LUE Weight Bearing: Non weight bearing LLE Weight Bearing: Weight bearing as tolerated Other Position/Activity Restrictions: per MD, completely NWB on L UE      Mobility  Bed Mobility Overal bed mobility: Needs Assistance Bed Mobility: Supine to Sit;Sit to Supine     Supine to sit: Total assist;+2 for physical assistance Sit to supine: Total assist;+2 for physical assistance;+2 for safety/equipment   General bed mobility comments: pt fearful and limited by pain, pt attempted to slide L LE to EOB but was unable. Used pads underneath to rotate hips around, for trunk elevation and to scoot to EOB    Transfers Overall transfer  level: Needs assistance Equipment used: Rolling [place] (2 wheeled) Transfers: Sit to/from Stand Sit to Stand: Max assist;+2 physical assistance         General transfer comment: trialed platform RW with patient not WB through L UE. Pt requirec cues for hand placement, have max A +2 for standing  Ambulation/Gait             General Gait Details: unable to due to pain and fear  Stairs            Wheelchair Mobility    Modified Rankin (Stroke Patients Only)       Balance Overall balance assessment: Needs assistance Sitting-balance support: Single extremity supported;Feet supported Sitting balance-Leahy Scale: Fair Sitting balance - Comments: min guard for safety sitting EOB   Standing balance support: Bilateral upper extremity supported Standing balance-Leahy Scale: Poor Standing balance comment: posterior lean and reliant on UE support and external assist in standing                             Pertinent Vitals/Pain Pain Assessment: Faces Faces Pain Scale: Hurts whole lot Pain Location: L LE during mobility to sit EOB and standing Pain Descriptors / Indicators: Grimacing;Guarding;Moaning Pain Intervention(s): Monitored during session    Home Living Family/patient expects to be discharged to:: Skilled nursing facility                 Additional Comments: Pt lives at [facility]    Prior Function Level of [place]: Needs assistance   Gait / Transfers Assistance Needed: uses RW with +1 assist from staff at [facility]  Comments: Per pt and dtr, pt was participating in [place] [facility]hab [facility] from back injury from falling last fall when she fell for this admission. Pt was using RW with staff for mobility and required assist for LB ADLs and toileting. Prior to back surgery, pt was in her [place] apt and was Ind with ADLs/selfcare, mobility with no AD and was driving     Hand Dominance   Dominant Hand: Right    Extremity/Trunk  Assessment   Upper Extremity Assessment Upper Extremity Assessment: Defer to OT evaluation LUE: Unable to fully assess due to immobilization    Lower Extremity Assessment Lower Extremity Assessment: Generalized weakness;LLE deficits/detail LLE Deficits / Details: grossly 2-/5 LLE: Unable to fully assess due to pain       Communication   Communication: No difficulties  Cognition Arousal/Alertness: Awake/alert Behavior During Therapy: WFL for tasks assessed/performed Overall Cognitive Status: History of cognitive impairments - at baseline                                 General Comments: Dtr states that pt is at baseline cognition      General Comments      Exercises     Assessment/Plan    PT Assessment Patient needs continued PT services  PT Problem List Decreased strength;Decreased activity tolerance;Decreased range of motion;Decreased balance;Decreased mobility;Decreased cognition;Decreased safety awareness;Decreased knowledge of use of DME;Pain       PT Treatment Interventions DME instruction;Functional mobility training;Therapeutic activities;Therapeutic exercise;Balance training;Gait training;Patient/family education    PT Goals (Current goals can be found in the Care Plan section)  Acute Rehab PT Goals Patient Stated Goal: go to rehab, take care of self again PT Goal Formulation: With patient/family Time For Goal Achievement: 04/24/20 Potential to Achieve Goals: Fair    Frequency Min 3X/week   Barriers to discharge        Co-evaluation PT/OT/SLP Co-Evaluation/Treatment: Yes Reason for Co-Treatment: For patient/therapist safety;To address functional/ADL transfers PT goals addressed during session: Mobility/safety with mobility;Balance;Proper use of DME OT goals addressed during session: ADL's and self-care;Proper use of Adaptive equipment and DME       AM-PAC PT "6 Clicks" Mobility  Outcome Measure Help needed turning from your back to  your side while in a flat bed without using bedrails?: A Lot Help needed moving from lying on your back to sitting on the side of a flat bed without using bedrails?: A Lot Help needed moving to and from a bed to a chair (including a wheelchair)?: A Lot Help needed standing up from a chair using your arms (e.g., wheelchair or bedside chair)?: A Lot Help needed to walk in hospital room?: A Lot Help needed climbing 3-5 steps with a railing? : Total 6 Click Score: 11    End of Session Equipment Utilized During Treatment: Gait belt Activity Tolerance: Patient limited by pain Patient left: in bed;with call bell/phone within reach;with bed alarm set Nurse Communication: Mobility status PT Visit Diagnosis: Unsteadiness on feet (R26.81);Muscle weakness (generalized) (M62.81);History of falling (Z91.81);Difficulty in walking, not elsewhere classified (R26.2)    Time: 8099-8338 PT Time Calculation (min) (ACUTE ONLY): 46 min   Charges:   PT Evaluation $PT Eval Moderate Complexity: 1 Mod          Lisa Castillo A. Gilford Rile PT, DPT Acute Rehabilitation Services Pager [phone] Office [phone]   Lisa Castillo 04/10/2020, 5:13 PM

## 2020-04-10 NOTE — TOC Initial Note (Signed)
Transition of Care [facility]) - Initial/Assessment Note    Patient Details  Name: Lisa Castillo MRN: [id number] Date of Birth: Nov 11, 1928  Transition of Care [facility]) CM/SW Contact:    Sharin Mons, RN Phone Number: 04/10/2020, 11:49 AM  Clinical Narrative:     Pt s/p fall, suffered L hip fx and L radius fx.              -s/p  Open treatment of intertrochanteric fracture with intramedullary implant and closed reduction of left distal radius fracture, 2/09  NCM spoke with pt's daughter [facility] @ bedside regarding d/c planning. Daughter stated pt is from [facility] SNF ( recovering from back injury). Daughter states would like for pt to return to [facility] rehab @ d/c. PT/OT evaluations pending...  RNCM discussed insurance authorization( not needed 2/2 pt is a resident of [place]) process and provided Medicare SNF ratings list. NCM called and touched based with [facility] admission's ,[phone].  Daughter expressed being hopeful for rehab and for mom to feel better soon. No further questions reported at this time. RNCM to continue to follow and assist with discharge planning needs.  Expected Discharge Plan: Skilled Nursing Facility Barriers to Discharge: Continued Medical Work up   Patient Goals and CMS Choice   CMS Medicare.gov Compare Post Acute Care list provided to:: Patient Represenative (must comment) Richardo Hanks, daughter)    Expected Discharge Plan and Services Expected Discharge Plan: [place]                                              Prior Living Arrangements/Services                       Activities of Daily Living      Permission Sought/Granted                  Emotional Assessment              Admission diagnosis:  Closed left hip fracture ([place]) [S72.002A] Closed fracture of left hip, initial encounter ([place]) [S72.002A] Closed fracture of left wrist, initial encounter  [S62.102A] Patient Active Problem List   Diagnosis Date Noted  . Closed left hip fracture, initial encounter ([place]) 04/07/2020  . Unspecified fracture of the lower end of left radius, initial encounter for closed fracture 04/07/2020  . Diabetes mellitus type 2 in nonobese ([place]) 04/07/2020  . Dementia ([place]) 04/07/2020  . Fall as cause of accidental injury in residential institution as place of occurrence 04/07/2020  . Closed left hip fracture ([place]) 04/07/2020  . Frequent falls 02/25/2020  . HTN (hypertension) 02/04/2020  . Hypokalemia 01/21/2020  . Rectal bleed 01/18/2020  . Right lower lobe pneumonia 01/14/2020  . Depression with anxiety 12/31/2019  . Left tubo-ovarian mass 12/31/2019  . Slow transit constipation 12/31/2019  . Burst fracture of lumbar vertebra, sequela 12/27/2019  . Senile dementia, delirium ([place]) 04/03/2019  . Tachycardia 04/03/2019  . Osteopenia 06/14/2018  . Diabetes mellitus type 2 in obese ([place]) 11/09/2017  . Obesity (BMI 30-39.9) 01/19/2017  . Mixed hyperlipidemia 06/29/2016  . Gait abnormality 04/27/2016  . Hearing loss   . Hypothyroidism   . Insomnia    PCP:  [name], MD Pharmacy:   [id number] [place] [place]  [place] [address] Phone: [phone] Fax: [phone]  [id number] - [place], [place] [address] [place] [address] Phone: [phone] Fax: [phone]     Social Determinants of Health (SDOH) Interventions    Readmission Risk Interventions No flowsheet data found.

## 2020-04-10 NOTE — Evaluation (Signed)
Occupational Therapy Evaluation Patient Details Name: Lisa Castillo MRN: [id number] DOB: May 04, 1928 Today's Date: 04/10/2020    History of Present Illness Lisa Castillo is a 87 y.o. female with medical history significant for HTN, hypothyroidism, dementia who presents by EMS after a fall at facility.  Daughter is at the bedside and provides history. The patient is apparently in a wheelchair from a previous spinal surgery but is able to ambulate at times.  She has an alarm on her wheelchair and somehow she went outside and attempted to ambulate. She stepped off of a curb and fell onto her left side.  She complains of severe pain in her left arm and leg.  She did hit her head and has a bruise and swelling of left forehead at eyebrow.  No reported loss of consciousness.   No report of chest pain or pressure.  Patient has not had any nausea, vomiting, diarrhea since the incident.  According to daughter she is at her baseline mental state.  She was found to have a fractured left distal radius and left hip on x-rays. PROCEDURE:   1.  Open treatment of intertrochanteric fracture with intramedullary implant. CPT [phone]   2.  Closed reduction and manipulation of left distal radius fracture with application of sugar tong splint   Clinical Impression   Pt presents with decline in function and safety with ADLs and ADL mobility with impaired strength, balance and endurance with hx of demential/cognitive impairments. Per pt and dtr, pt was participating in [facility] at [facility] from back injury from falling last fall when she fell for this admission. Pt was using RW with staff for mobility and required assist for LB ADLs and toileting. Prior to back surgery, [place] and was Ind with ADLs/selfcare, mobility with no AD and was driving. Pt currently requires total A +2 to sit EOB, max A +2 for sit - stand, max - total A with ADLs/selfcare. Pt would benefit from acute OT services to address impairments to  maximize level of function and safety    Follow Up Recommendations  SNF;Supervision/Assistance - 24 hour    Equipment Recommendations  Other (comment) (TBD at SNF)    Recommendations for Other Services       Precautions / Restrictions Precautions Precautions: Fall Required Braces or Orthoses: Splint/Cast Splint/Cast: L forearm/elbow Restrictions LUE Weight Bearing: Non weight bearing LLE Weight Bearing: Weight bearing as tolerated      Mobility Bed Mobility Overal bed mobility: Needs Assistance Bed Mobility: Supine to Sit;Sit to Supine     Supine to sit: Total assist;+2 for physical assistance Sit to supine: +2 for physical assistance   General bed mobility comments: pt fearful and limited by pain, pt attempted to slide L LE to EOB but was unable. Used pads underneath to rotate hips around, for trunk elevation and to scoot to EOB    Transfers Overall transfer level: Needs assistance   Transfers: Sit to/from Stand Sit to Stand: Max assist;+2 physical assistance         General transfer comment: trialed PFRW. Pt requirec cues for hand placement, have max A +2 for standing    Balance Overall balance assessment: Needs assistance Sitting-balance support: Single extremity supported;Feet supported Sitting balance-Leahy Scale: Fair     Standing balance support: Bilateral upper extremity supported Standing balance-Leahy Scale: Poor Standing balance comment: posterior lean  ADL either performed or assessed with clinical judgement   ADL Overall ADL's : Needs assistance/impaired Eating/Feeding: Set up;Supervision/ safety;Sitting   Grooming: Wash/dry hands;Wash/dry face;Min guard;Sitting   Upper Body Bathing: Maximal assistance   Lower Body Bathing: Maximal assistance   Upper Body Dressing : Maximal assistance   Lower Body Dressing: Total assistance   Toilet Transfer: Maximal assistance;+2 for physical assistance    Toileting- Clothing Manipulation and Hygiene: Total assistance       Functional mobility during ADLs: Total assistance;Maximal assistance;+2 for physical assistance;Cueing for safety;Cueing for sequencing;Rolling walker       Vision Baseline Vision/History: Wears glasses Patient Visual Report: No change from baseline       Perception     Praxis      Pertinent Vitals/Pain Pain Assessment: Faces Faces Pain Scale: Hurts whole lot Pain Location: L LE during mobility to sit EOB and standing Pain Descriptors / Indicators: Grimacing;Guarding;Moaning Pain Intervention(s): Limited activity within patient's tolerance;Premedicated before session;Monitored during session;Repositioned     Hand Dominance Right   Extremity/Trunk Assessment Upper Extremity Assessment Upper Extremity Assessment: Generalized weakness;LUE deficits/detail LUE: Unable to fully assess due to immobilization   Lower Extremity Assessment Lower Extremity Assessment: Defer to PT evaluation       Communication Communication Communication: No difficulties   Cognition Arousal/Alertness: Awake/alert Behavior During Therapy: WFL for tasks assessed/performed Overall Cognitive Status: History of cognitive impairments - at baseline                                 General Comments: Dtr states that pt is at baseline cognition   General Comments       Exercises     Shoulder Instructions      Home Living Family/patient expects to be discharged to:: Skilled nursing facility                                 Additional Comments: Pt lives at [place] at [facility]      Prior Functioning/Environment Level of Independence: Needs assistance  Gait / Transfers Assistance Needed: uses RW with +1 assist from staff at SNF     Comments: Per pt and dtr, pt was participating in [facility]hab at [facility] from back injury from falling last fall when she fell for this admission. Pt was using RW with  staff for mobility and required assist for LB ADLs and toileting. Prior to back surgery, [place] and was Ind with ADLs/selfcare, mobility with no AD and was driving        OT Problem List: Decreased strength;Impaired balance (sitting and/or standing);Decreased cognition;Decreased knowledge of precautions;Pain;Decreased safety awareness;Decreased range of motion;Decreased activity tolerance;Decreased coordination;Decreased knowledge of use of DME or AE;Impaired UE functional use      OT Treatment/Interventions: Self-care/ADL training;DME and/or AE instruction;Therapeutic activities;Balance training;Patient/family education;Energy conservation    OT Goals(Current goals can be found in the care plan section) Acute Rehab OT Goals Patient Stated Goal: go to rehab, take care of self again OT Goal Formulation: With patient/family Time For Goal Achievement: 04/24/20 Potential to Achieve Goals: Good ADL Goals Pt Will Perform Grooming: with supervision;with set-up;sitting Pt Will Perform Upper Body Bathing: with mod assist;sitting Pt Will Perform Upper Body Dressing: with mod assist;sitting Pt Will Transfer to Toilet: with max assist;with mod assist;ambulating;bedside commode  OT Frequency: Min 2X/week   Barriers to  D/C:            Co-evaluation PT/OT/SLP Co-Evaluation/Treatment: Yes Reason for Co-Treatment: For patient/therapist safety;To address functional/ADL transfers   OT goals addressed during session: ADL's and self-care;Proper use of Adaptive equipment and DME      AM-PAC OT "6 Clicks" Daily Activity     Outcome Measure Help from another person eating meals?: A Little Help from another person taking care of personal grooming?: A Little Help from another person toileting, which includes using toliet, bedpan, or urinal?: Total Help from another person bathing (including washing, rinsing, drying)?: Total Help from another person to put on and taking off regular upper  body clothing?: A Lot Help from another person to put on and taking off regular lower body clothing?: Total 6 Click Score: 11   End of Session Equipment Utilized During Treatment: Gait belt;Rolling walker;Other (comment) (PFRW) Nurse Communication: Mobility status  Activity Tolerance: Patient limited by fatigue;Patient limited by pain Patient left: in bed;with call bell/phone within reach;with bed alarm set;with family/visitor present  OT Visit Diagnosis: Unsteadiness on feet (R26.81);Muscle weakness (generalized) (M62.81);History of falling (Z91.81);Other abnormalities of gait and mobility (R26.89);Other symptoms and signs involving cognitive function;Pain Pain - Right/Left: Left Pain - part of body: Leg;Arm                Time: 4163-8453 OT Time Calculation (min): 41 min Charges:  OT General Charges $OT Visit: 1 Visit OT Evaluation $OT Eval Moderate Complexity: 1 Mod    Britt Bottom 04/10/2020, 4:24 PM

## 2020-04-11 LAB — GLUCOSE, CAPILLARY
Glucose-Capillary: 99 mg/dL (ref 70–99)
Glucose-Capillary: 93 mg/dL (ref 70–99)
Glucose-Capillary: 122 mg/dL — ABNORMAL HIGH (ref 70–99)
Glucose-Capillary: 143 mg/dL — ABNORMAL HIGH (ref 70–99)

## 2020-04-11 LAB — BASIC METABOLIC PANEL
Anion gap: 10 (ref 5–15)
BUN: 16 mg/dL (ref 8–23)
CO2: 23 mmol/L (ref 22–32)
Calcium: 8 mg/dL — ABNORMAL LOW (ref 8.9–10.3)
Chloride: 108 mmol/L (ref 98–111)
Creatinine, Ser: 0.57 mg/dL (ref 0.44–1.00)
GFR, Estimated: >60 mL/min (ref >60)
Glucose, Bld: 106 mg/dL — ABNORMAL HIGH (ref 70–99)
Potassium: 3.5 mmol/L (ref 3.5–5.1)
Sodium: 141 mmol/L (ref 135–145)

## 2020-04-11 LAB — CBC
HCT: 26.2 % — ABNORMAL LOW (ref 36.0–46.0)
Hemoglobin: 8.1 g/dL — ABNORMAL LOW (ref 12.0–15.0)
MCH: 26.2 pg (ref 26.0–34.0)
MCHC: 30.9 g/dL (ref 30.0–36.0)
MCV: 84.8 fL (ref 80.0–100.0)
Platelets: 276 10*3/uL (ref 150–400)
RBC: 3.09 MIL/uL — ABNORMAL LOW (ref 3.87–5.11)
RDW: 15.4 % (ref 11.5–15.5)
WBC: 10.2 10*3/uL (ref 4.0–10.5)
nRBC: 0 % (ref 0.0–0.2)

## 2020-04-11 MED ORDER — POLYETHYLENE GLYCOL 3350 17 G PO PACK
17.0000 g | PACK | Freq: Every day | ORAL | Status: DC
  Administered 2020-04-11: 17 g via ORAL
  Filled 2020-04-11 (×2): qty 1

## 2020-04-11 MED ORDER — MAGNESIUM CITRATE PO SOLN
1.0000 | Freq: Once | ORAL | Status: AC
  Administered 2020-04-11: 1 via ORAL
  Filled 2020-04-11: qty 296

## 2020-04-11 MED ORDER — SODIUM CHLORIDE 0.9 % IV BOLUS: 500.0000 mL | Freq: Once | INTRAVENOUS | Status: DC

## 2020-04-11 MED ORDER — LACTATED RINGERS IV BOLUS
500.0000 mL | Freq: Once | INTRAVENOUS | Status: AC
  Administered 2020-04-11: 500 mL via INTRAVENOUS

## 2020-04-11 MED ORDER — SENNOSIDES-DOCUSATE SODIUM 8.6-50 MG PO TABS
1.0000 | ORAL_TABLET | Freq: Every day | ORAL | Status: DC
  Administered 2020-04-11 – 2020-04-12 (×2): 1 via ORAL
  Filled 2020-04-11 (×2): qty 1

## 2020-04-11 MED ORDER — BISACODYL 10 MG RE SUPP
10.0000 mg | Freq: Once | RECTAL | Status: AC
  Administered 2020-04-11: 10 mg via RECTAL
  Filled 2020-04-11: qty 1

## 2020-04-11 MED ORDER — MAGNESIUM CITRATE PO SOLN
1.0000 | Freq: Once | ORAL | Status: DC
Start: 2020-04-11 — End: 2020-04-11

## 2020-04-11 NOTE — Progress Notes (Signed)
Subjective: 2 Days Post-Op Procedure(s) (LRB): LEFT INTERTROCHANTERIC INTRAMEDULLARY (IM) NAIL (Left) CLOSED REDUCTION LEFT DISTAL RADIUS FRACTURE (Left) Patient reports pain as mild.  Much more alert today  Objective: Vital signs in last 24 hours: Temp:  [97.7 F (36.5 C)-98 F (36.7 C)] 98 F (36.7 C) (02/11 0404) Pulse Rate:  [88-96] 96 (02/11 0404) Resp:  [15-16] 16 (02/11 0404) BP: (115-132)/(43-60) 129/60 (02/11 0404) SpO2:  [96 %-100 %] 100 % (02/11 0404)  Intake/Output from previous day: 02/10 0701 - 02/11 0700 In: 1490.8 [P.O.:480; I.V.:1010.8] Out: 750 [Urine:750] Intake/Output this shift: No intake/output data recorded.  Recent Labs    04/09/20 0315 04/09/20 2035 04/10/20 0155 04/11/20 0136  HGB 10.1* 9.8* 8.9* 8.1*   Recent Labs    04/10/20 0155 04/11/20 0136  WBC 13.3* 10.2  RBC 3.43* 3.09*  HCT 28.2* 26.2*  PLT 303 276   Recent Labs    04/10/20 0155 04/11/20 0136  NA 138 141  K 3.9 3.5  CL 104 108  CO2 23 23  BUN 12 16  CREATININE 0.62 0.57  GLUCOSE 150* 106*  CALCIUM 8.5* 8.0*   No results for input(s): LABPT, INR in the last 72 hours.  Neurologically intact Neurovascular intact Sensation intact distally Intact pulses distally Dorsiflexion/Plantar flexion intact Incision: dressing C/D/I No cellulitis present Compartment soft   Assessment/Plan: 2 Days Post-Op Procedure(s) (LRB): LEFT INTERTROCHANTERIC INTRAMEDULLARY (IM) NAIL (Left) CLOSED REDUCTION LEFT DISTAL RADIUS FRACTURE (Left) Advance diet Up with therapy  LUE- NWB.  Continue with sling.  Continue to elevate for pain and swelling LLE- WBAT.  Ice as needed for pain and swelling Lovenox for dvt ppx F/u with Dr. Erlinda Hong 2 weeks post-op     Lisa Castillo 04/11/2020, 8:22 AM

## 2020-04-11 NOTE — Progress Notes (Signed)
Physical Therapy Treatment Patient Details Name: Lisa Castillo MRN: [id number] DOB: [birth year] Today's Date: 04/11/2020    History of Present Illness 87 y.o. female with medical history significant for HTN, hypothyroidism, dementia who presents by EMS after an unwitnessed fall at facility. Patient sustained L femur fx and L distal radius fx. Patient s/p IM nail L femur and closed reduction of L distal radius on 2/9.    PT Comments    Patient continues to be limited by pain. Patient requires +2 assistance for mobility with HHAx1. Patient unable to stand more than 15 seconds. Increased confusion this session, per daughter patient fluctuates with cognition. Continue to recommend SNF for ongoing Physical Therapy.       Follow Up Recommendations  SNF (return to SNF)     Equipment Recommendations  None recommended by PT    Recommendations for Other Services       Precautions / Restrictions Precautions Precautions: Fall Required Braces or Orthoses: Splint/Cast Splint/Cast: L forearm/elbow Restrictions Weight Bearing Restrictions: Yes LUE Weight Bearing: Non weight bearing LLE Weight Bearing: Weight bearing as tolerated Other Position/Activity Restrictions: per MD, completely NWB on L UE    Mobility  Bed Mobility Overal bed mobility: Needs Assistance Bed Mobility: Supine to Sit;Sit to Supine     Supine to sit: Max assist;+2 for physical assistance;+2 for safety/equipment Sit to supine: Total assist;+2 for physical assistance;+2 for safety/equipment   General bed mobility comments: pt fearful and limited by pain, pt attempted to slide L LE to EOB but was unable. Used pads underneath to rotate hips around, for trunk elevation and to scoot to EOB    Transfers Overall transfer level: Needs assistance Equipment used: 1 person hand held assist Transfers: Sit to/from Stand Sit to Stand: Mod assist;+2 physical assistance;+2 safety/equipment         General transfer comment:  HHAx1 requiring modA+2 to stand x 2. Unable to stand for more than 15 seconds  Ambulation/Gait                 Stairs             Wheelchair Mobility    Modified Rankin (Stroke Patients Only)       Balance Overall balance assessment: Needs assistance Sitting-balance support: Single extremity supported;Feet supported Sitting balance-Leahy Scale: Fair Sitting balance - Comments: min guard for safety sitting EOB                                    Cognition Arousal/Alertness: Awake/alert Behavior During Therapy: WFL for tasks assessed/performed Overall Cognitive Status: History of cognitive impairments - at baseline                                 General Comments: Dtr states that pt is at baseline cognition      Exercises      General Comments General comments (skin integrity, edema, etc.): Patient on 3L O2 [place] on arrival with spo2 >96%      Pertinent Vitals/Pain Pain Assessment: Faces Faces Pain Scale: Hurts whole lot Pain Location: L LE during mobility to sit EOB and standing Pain Descriptors / Indicators: Grimacing;Guarding;Moaning Pain Intervention(s): Monitored during session;Repositioned    Home Living                      Prior Function  PT Goals (current goals can now be found in the care plan section) Acute Rehab PT Goals Patient Stated Goal: go to rehab, take care of self again PT Goal Formulation: With patient/family Time For Goal Achievement: 04/24/20 Potential to Achieve Goals: Fair Progress towards PT goals: Not progressing toward goals - comment (limited by pain and cognition)    Frequency    Min 3X/week      PT Plan Current plan remains appropriate    Co-evaluation              AM-PAC PT "6 Clicks" Mobility   Outcome Measure  Help needed turning from your back to your side while in a flat bed without using bedrails?: A Lot Help needed moving from lying on your back  to sitting on the side of a flat bed without using bedrails?: A Lot Help needed moving to and from a bed to a chair (including a wheelchair)?: A Lot Help needed standing up from a chair using your arms (e.g., wheelchair or bedside chair)?: A Lot Help needed to walk in hospital room?: Total Help needed climbing 3-5 steps with a railing? : Total 6 Click Score: 10    End of Session Equipment Utilized During Treatment: Gait belt;Oxygen Activity Tolerance: Patient limited by pain Patient left: in bed;with call bell/phone within reach;with bed alarm set Nurse Communication: Mobility status PT Visit Diagnosis: Unsteadiness on feet (R26.81);Muscle weakness (generalized) (M62.81);History of falling (Z91.81);Difficulty in walking, not elsewhere classified (R26.2)     Time: 1245-8099 PT Time Calculation (min) (ACUTE ONLY): 24 min  Charges:  $Therapeutic Activity: 23-37 mins                     Bera Pinela A. Gilford Rile PT, DPT Acute Rehabilitation Services Pager [phone] Office [phone]    Linna Hoff 04/11/2020, 4:02 PM

## 2020-04-11 NOTE — Progress Notes (Signed)
[facility]                                                                              Patient Demographics  Lisa Castillo, is a 87 y.o. female, DOB - Mar 18, 1928, QZR:[id number]  Admit date - 04/07/2020   Admitting Physician Lisa Burow, MD  Outpatient Primary MD for the patient is Lisa Dad, MD  Outpatient specialists:   LOS - 4  days   Medical records reviewed and are as summarized below:    Chief Complaint  Patient presents with  . Fall       Brief summary   [name] a 87 year old female with history of HTN, hypothyroidism, dementia who presents by EMS after a fall at facility.   The patient is apparently in a wheelchair from a previous spinal surgery but is able to ambulate at times.  Patient has an alarm on her wheelchair and somehow she went outside and attempted to ambulate.  Patient stepped off of a curb and fell onto her left side.  She did hit her head, no report of loss of consciousness. Patient was found to have left distal radius fracture and left hip fracture on the x-rays.  Head CT and neck CT were negative for any acute fracture Orthopedics consulted. Left wrist fracture was splinted in ED.  2/9: Plan for left hip fracture repair today  Assessment & Plan     Principal Problem:   Closed left hip fracture, initial encounter [facility]) -Orthopedics was consulted, underwent ORIF of intertrochanteric fracture on 2/9, postop day # 2 -Fully alert and oriented today, will continue to hold Seroquel and Depakote, continue current pain regimen which is working.  -Continue PT OT, recommended SNF -Hemoglobin 8.1, monitor closely  Acute urinary retention -Patient developed urinary retention on 2/10 evening, placed on Flomax for 7 days, straight cath x3 -Gave Ringer lactate 500 cc bolus today, patient did void 100 cc  Constipation -Added MiraLAX, Colace, Dulcolax suppository -Mag citrate x1 if no BM by evening     HTN (hypertension) -BP  currently stable, continue losartan, metoprolol     Unspecified fracture of the lower end of left radius -  Underwent closed reduction of the left distal fracture with splint, postop day #2 -Currently in sling    Diabetes mellitus type 2 in nonobese ([place]), NIDDM -Continue to hold Amaryl.  -CBGs currently controlled, continue SSI - Hemoglobin A1c 5.5    Dementia (HCC) -Continue Zoloft, hold Seroquel and Depakote, patient is currently alert and oriented back to her baseline  Mechanical fall as cause of accidental injury in residential institution as place of occurrence   -PT OT evaluation  Hypothyroidism -Continue Synthroid   Code Status: DNR status DVT Prophylaxis:  enoxaparin (LOVENOX) injection 40 mg Start: 04/10/20 1700 SCDs Start: 04/09/20 2019 SCDs Start: 04/07/20 2254   Level of Care: Level of care: Med-Surg Family Communication: Discussed all imaging results, lab results, explained to the patient's daughter at the bedside on 2/9, 2/10, 2/11 today  Disposition Plan:     Status is: Inpatient  Remains inpatient appropriate because:Inpatient level of care appropriate due to severity of illness  Dispo: The patient is from: SNF              Anticipated d/c is to: SNF              Anticipated d/c date is: > 3 days              Patient currently is not medically stable to d/c.  Much more alert and oriented, able to void , no BM yet.  Hopefully DC over the weekend if stable   difficult to place patient No      Time Spent in minutes 35 minutes  Procedures:    Consultants:   Orthopedics  Antimicrobials:   Anti-infectives (From admission, onward)   Start     Dose/Rate Route Frequency Ordered Stop   04/09/20 2200  ceFAZolin (ANCEF) IVPB 2g/100 mL premix        2 g 200 mL/hr over 30 Minutes Intravenous Every 6 hours 04/09/20 2018 04/10/20 1016   04/09/20 0600  ceFAZolin (ANCEF) IVPB 2g/100 mL premix        2 g 200 mL/hr over 30 Minutes Intravenous On call to  O.R. 04/09/20 0327 04/09/20 1537         Medications  Scheduled Meds: . atorvastatin  10 mg Oral Daily  . Chlorhexidine Gluconate Cloth  6 each Topical Daily  . docusate sodium  100 mg Oral BID  . enoxaparin (LOVENOX) injection  40 mg Subcutaneous Q24H  . insulin aspart  0-5 Units Subcutaneous QHS  . insulin aspart  0-9 Units Subcutaneous TID WC  . levothyroxine  75 mcg Oral QAC breakfast  . losartan  25 mg Oral Daily  . magnesium citrate  1 Bottle Oral Once  . metoprolol tartrate  12.5 mg Oral BID  . polyethylene glycol  17 g Oral Daily  . senna-docusate  1 tablet Oral Daily  . sertraline  50 mg Oral Daily  . tamsulosin  0.4 mg Oral Daily   Continuous Infusions: . methocarbamol (ROBAXIN) IV     PRN Meds:.acetaminophen, alum & mag hydroxide-simeth, HYDROmorphone (DILAUDID) injection, menthol-cetylpyridinium **OR** phenol, methocarbamol **OR** methocarbamol (ROBAXIN) IV, ondansetron **OR** ondansetron (ZOFRAN) IV, oxyCODONE, sorbitol      Subjective:   Lisa Castillo was seen and examined today.  Acute urinary retention since yesterday.  Bladder scan with 300 cc this morning during my exam.  No BM yet since admission.  Alert and oriented today, pain improving.  Daughter at the bedside.   Objective:   Vitals:   04/10/20 1442 04/10/20 2001 04/11/20 0404 04/11/20 0859  BP: (!) 132/43 (!) 115/54 129/60 (!) 133/50  Pulse: 89 88 96 100  Resp: 15 16 16 17   Temp: 97.7 F (36.5 C) 98 F (36.7 C) 98 F (36.7 C) 98.4 F (36.9 C)  TempSrc: Oral Oral  Oral  SpO2: 96% 97% 100% 100%  Weight:      Height:        Intake/Output Summary (Last 24 hours) at 04/11/2020 1502 Last data filed at 04/10/2020 2045 Gross per 24 hour  Intake 240 ml  Output 550 ml  Net -310 ml     Wt Readings from Last 3 Encounters:  04/09/20 63.5 kg  03/21/20 63.7 kg  03/20/20 63.7 kg   Physical Exam  General: Alert and oriented x 3, NAD  Cardiovascular: S1 S2 clear, RRR. No pedal edema  b/l  Respiratory: CTAB, no wheezing, rales or rhonchi  Gastrointestinal: Soft, nontender, nondistended, NBS  Ext: no pedal edema bilaterally, left  arm in sling  Neuro: no new deficits  Musculoskeletal: No cyanosis, clubbing  Skin: No rashes  Psych: Normal affect and demeanor, alert and oriented x3     Data Reviewed:  I have personally reviewed following labs and imaging studies  Micro Results Recent Results (from the past 240 hour(s))  SARS CORONAVIRUS 2 (TAT 6-24 HRS) Nasopharyngeal Nasopharyngeal Swab     Status: None   Collection Time: 04/07/20  7:34 PM   Specimen: Nasopharyngeal Swab  Result Value Ref Range Status   SARS Coronavirus 2 NEGATIVE NEGATIVE Final    Comment: (NOTE) SARS-CoV-2 target nucleic acids are NOT DETECTED.  The SARS-CoV-2 RNA is generally detectable in upper and lower respiratory specimens during the acute phase of infection. Negative results do not preclude SARS-CoV-2 infection, do not rule out co-infections with other pathogens, and should not be used as the sole basis for treatment or other patient management decisions. Negative results must be combined with clinical observations, patient history, and epidemiological information. The expected result is Negative.  Fact Sheet for Patients: SugarRoll.be  Fact Sheet for Healthcare Providers: https://www.woods-mathews.com/  This test is not yet approved or cleared by the [place] FDA and  has been authorized for detection and/or diagnosis of SARS-CoV-2 by FDA under an Emergency Use Authorization (EUA). This EUA will remain  in effect (meaning this test can be used) for the duration of the COVID-19 declaration under Se ction 564(b)(1) of the Act, 21 U.S.C. section 360bbb-3(b)(1), unless the authorization is terminated or revoked sooner.  Performed at [facility], [place] [address] [place], [place] [address]   MRSA PCR Screening     Status:  None   Collection Time: 04/09/20 12:20 PM   Specimen: Nasal Mucosa; Nasopharyngeal  Result Value Ref Range Status   MRSA by PCR NEGATIVE NEGATIVE Final    Comment:        The GeneXpert MRSA Assay (FDA approved for NASAL specimens only), is one component of a comprehensive MRSA colonization surveillance program. It is not intended to diagnose MRSA infection nor to guide or monitor treatment for MRSA infections. Performed at [facility], [place] [address] [place], [place] [address]     Radiology Reports DG Chest 1 View  Result Date: 04/07/2020 CLINICAL DATA:  Found down, dementia EXAM: CHEST  1 VIEW COMPARISON:  None. FINDINGS: Single frontal view of the chest demonstrates an unremarkable cardiac silhouette. No airspace disease, effusion, or pneumothorax. No acute bony abnormalities. IMPRESSION: 1. No acute intrathoracic process. Electronically Signed   By: Randa Ngo M.D.   On: 04/07/2020 20:24   DG Wrist Complete Left  Result Date: 04/09/2020 CLINICAL DATA:  Close reduction left wrist fracture EXAM: LEFT WRIST - COMPLETE 3+ VIEW COMPARISON:  04/07/2020 FINDINGS: Two fluoroscopic images are obtained during the performance of the procedure and are provided for interpretation only. There has been interval reduction of the comminuted distal left radial fracture seen previously, now with near anatomic alignment. Please refer to the operative report. FLUOROSCOPY TIME:  8 seconds IMPRESSION: 1. Reduction of distal left radial fracture with near anatomic alignment. Electronically Signed   By: Randa Ngo M.D.   On: 04/09/2020 18:07   DG Wrist Complete Left  Result Date: 04/07/2020 CLINICAL DATA:  Post reduction film of known left wrist fracture. EXAM: LEFT WRIST - COMPLETE 3+ VIEW COMPARISON:  None. FINDINGS: Splinting material obscures fine bony and soft tissue detail. Interval splinting with similar alignment of the displaced, impacted dorsally angulated fracture  through the distal  radius. IMPRESSION: Interval splinting with similar alignment of the distal radial fracture. Electronically Signed   By: Dahlia Bailiff MD   On: 04/07/2020 23:11   DG Wrist Complete Left  Result Date: 04/07/2020 CLINICAL DATA:  Pain EXAM: LEFT WRIST - COMPLETE 3+ VIEW COMPARISON:  None. FINDINGS: There is an acute, displaced and impacted fracture through the distal radius with significant dorsal angulation. There is surrounding soft tissue swelling. No frank dislocation. There is an old healed fracture of the fourth metacarpal. There are degenerative changes of the partially visualized interphalangeal joints. IMPRESSION: Acute, displaced, impacted fracture through the distal radius with significant dorsal angulation. Electronically Signed   By: Constance Holster M.D.   On: 04/07/2020 20:24   DG Ankle Complete Left  Result Date: 04/07/2020 CLINICAL DATA:  Pain EXAM: LEFT ANKLE COMPLETE - 3+ VIEW COMPARISON:  None. FINDINGS: There is no evidence of fracture, dislocation, or joint effusion. There is no evidence of arthropathy or other focal bone abnormality. Soft tissues are unremarkable. IMPRESSION: Negative. Electronically Signed   By: Constance Holster M.D.   On: 04/07/2020 20:25   CT HEAD WO CONTRAST  Result Date: 04/07/2020 CLINICAL DATA:  Facial trauma EXAM: CT HEAD WITHOUT CONTRAST TECHNIQUE: Contiguous axial images were obtained from the base of the skull through the vertex without intravenous contrast. COMPARISON:  None. FINDINGS: Brain: No evidence of acute territorial infarction, hemorrhage, hydrocephalus,extra-axial collection or mass lesion/mass effect. There is dilatation the ventricles and sulci consistent with age-related atrophy. Low-attenuation changes in the deep white matter consistent with small vessel ischemia. Vascular: No hyperdense vessel or unexpected calcification. Skull: The skull is intact. No fracture or focal lesion identified. Mild soft tissue swelling seen over the left  frontal skull. Sinuses/Orbits: The visualized paranasal sinuses and mastoid air cells are clear. The orbits and globes intact. Other: None Cervical spine: Alignment: Physiologic Skull base and vertebrae: Visualized skull base is intact. No atlanto-occipital dissociation. The vertebral body heights are well maintained. No fracture or pathologic osseous lesion seen. Soft tissues and spinal canal: The visualized paraspinal soft tissues are unremarkable. No prevertebral soft tissue swelling is seen. The spinal canal is grossly unremarkable, no large epidural collection or significant canal narrowing. Disc levels: Multilevel cervical spine spondylosis seen with disc osteophyte complex and uncovertebral osteophytes most notable at C4-C5 and C5-C6. Upper chest: The lung apices are clear. Thoracic inlet is within normal limits. Other: None IMPRESSION: No acute intracranial abnormality. Findings consistent with age related atrophy and chronic small vessel ischemia No acute fracture or malalignment of the spine. Electronically Signed   By: Prudencio Pair M.D.   On: 04/07/2020 20:43   CT CERVICAL SPINE WO CONTRAST  Result Date: 04/07/2020 CLINICAL DATA:  Facial trauma EXAM: CT HEAD WITHOUT CONTRAST TECHNIQUE: Contiguous axial images were obtained from the base of the skull through the vertex without intravenous contrast. COMPARISON:  None. FINDINGS: Brain: No evidence of acute territorial infarction, hemorrhage, hydrocephalus,extra-axial collection or mass lesion/mass effect. There is dilatation the ventricles and sulci consistent with age-related atrophy. Low-attenuation changes in the deep white matter consistent with small vessel ischemia. Vascular: No hyperdense vessel or unexpected calcification. Skull: The skull is intact. No fracture or focal lesion identified. Mild soft tissue swelling seen over the left frontal skull. Sinuses/Orbits: The visualized paranasal sinuses and mastoid air cells are clear. The orbits and  globes intact. Other: None Cervical spine: Alignment: Physiologic Skull base and vertebrae: Visualized skull base is intact. No atlanto-occipital dissociation. The vertebral  body heights are well maintained. No fracture or pathologic osseous lesion seen. Soft tissues and spinal canal: The visualized paraspinal soft tissues are unremarkable. No prevertebral soft tissue swelling is seen. The spinal canal is grossly unremarkable, no large epidural collection or significant canal narrowing. Disc levels: Multilevel cervical spine spondylosis seen with disc osteophyte complex and uncovertebral osteophytes most notable at C4-C5 and C5-C6. Upper chest: The lung apices are clear. Thoracic inlet is within normal limits. Other: None IMPRESSION: No acute intracranial abnormality. Findings consistent with age related atrophy and chronic small vessel ischemia No acute fracture or malalignment of the spine. Electronically Signed   By: Prudencio Pair M.D.   On: 04/07/2020 20:43   DG C-Arm 1-60 Min  Result Date: 04/09/2020 CLINICAL DATA:  ORIF left femur. EXAM: DG C-ARM 1-60 MIN; LEFT FEMUR 2 VIEWS FLUOROSCOPY TIME:  Fluoroscopy Time:  1 minutes 41 seconds Reported radiation: 17 mGy. COMPARISON:  April 07, 2020 FINDINGS: Four C-arm fluoroscopic images were obtained intraoperatively and submitted for post operative interpretation. These images demonstrate fixation of an inter trochanteric fracture of the left femur with intramedullary nail and screws. Improved, near anatomic alignment. No unexpected findings. Please see the performing provider's procedural report for further detail. IMPRESSION: Intraoperative fluoroscopic imaging, as detailed above. Electronically Signed   By: Margaretha Sheffield MD   On: 04/09/2020 17:10   DG Hip Unilat With Pelvis 2-3 Views Left  Result Date: 04/07/2020 CLINICAL DATA:  Pain EXAM: DG HIP (WITH OR WITHOUT PELVIS) 2-3V LEFT COMPARISON:  None. FINDINGS: There is an acute, comminuted and displaced  intratrochanteric fracture of the proximal left femur. There is no dislocation. There is osteopenia. There are moderate degenerative changes of both hips. IMPRESSION: Acute, comminuted and displaced intratrochanteric fracture of the proximal left femur. Electronically Signed   By: Constance Holster M.D.   On: 04/07/2020 20:24   DG FEMUR MIN 2 VIEWS LEFT  Result Date: 04/09/2020 CLINICAL DATA:  ORIF left femur. EXAM: DG C-ARM 1-60 MIN; LEFT FEMUR 2 VIEWS FLUOROSCOPY TIME:  Fluoroscopy Time:  1 minutes 41 seconds Reported radiation: 17 mGy. COMPARISON:  April 07, 2020 FINDINGS: Four C-arm fluoroscopic images were obtained intraoperatively and submitted for post operative interpretation. These images demonstrate fixation of an inter trochanteric fracture of the left femur with intramedullary nail and screws. Improved, near anatomic alignment. No unexpected findings. Please see the performing provider's procedural report for further detail. IMPRESSION: Intraoperative fluoroscopic imaging, as detailed above. Electronically Signed   By: Margaretha Sheffield MD   On: 04/09/2020 17:10    Lab Data:  CBC: Recent Labs  Lab 04/07/20 1921 04/08/20 0410 04/09/20 0315 04/09/20 2035 04/10/20 0155 04/11/20 0136  WBC 14.1* 12.8* 15.1* 15.2* 13.3* 10.2  NEUTROABS 10.0*  --   --   --   --   --   HGB 11.3* 10.6* 10.1* 9.8* 8.9* 8.1*  HCT 35.1* 34.0* 30.8* 30.5* 28.2* 26.2*  MCV 82.2 83.7 80.8 82.0 82.2 84.8  PLT 341 320 347 310 303 767   Basic Metabolic Panel: Recent Labs  Lab 04/07/20 1921 04/08/20 0410 04/09/20 0315 04/09/20 2035 04/10/20 0155 04/11/20 0136  NA 139 139 138  --  138 141  K 4.1 4.5 3.9  --  3.9 3.5  CL 102 105 104  --  104 108  CO2 23 24 25   --  23 23  GLUCOSE 147* 171* 134*  --  150* 106*  BUN 27* 20 12  --  12 16  CREATININE 0.70  0.58 0.56 0.65 0.62 0.57  CALCIUM 8.7* 8.7* 8.8*  --  8.5* 8.0*   GFR: Estimated Creatinine Clearance: 39.7 mL/min (by C-G formula based on SCr of  0.57 mg/dL). Liver Function Tests: No results for input(s): AST, ALT, ALKPHOS, BILITOT, PROT, ALBUMIN in the last 168 hours. No results for input(s): LIPASE, AMYLASE in the last 168 hours. No results for input(s): AMMONIA in the last 168 hours. Coagulation Profile: Recent Labs  Lab 04/07/20 1921  INR 1.0   Cardiac Enzymes: No results for input(s): CKTOTAL, CKMB, CKMBINDEX, TROPONINI in the last 168 hours. BNP (last 3 results) No results for input(s): PROBNP in the last 8760 hours. HbA1C: No results for input(s): HGBA1C in the last 72 hours. CBG: Recent Labs  Lab 04/10/20 1208 04/10/20 1620 04/10/20 1959 04/11/20 0654 04/11/20 1130  GLUCAP 107* 115* 110* 99 93   Lipid Profile: No results for input(s): CHOL, HDL, LDLCALC, TRIG, CHOLHDL, LDLDIRECT in the last 72 hours. Thyroid Function Tests: No results for input(s): TSH, T4TOTAL, FREET4, T3FREE, THYROIDAB in the last 72 hours. Anemia Panel: No results for input(s): VITAMINB12, FOLATE, FERRITIN, TIBC, IRON, RETICCTPCT in the last 72 hours. Urine analysis: No results found for: COLORURINE, APPEARANCEUR, LABSPEC, PHURINE, GLUCOSEU, HGBUR, BILIRUBINUR, KETONESUR, PROTEINUR, UROBILINOGEN, NITRITE, LEUKOCYTESUR   Khyrin Trevathan M.D. [facility] 04/11/2020, 3:02 PM   Call night coverage person covering after 7pm

## 2020-04-11 NOTE — Progress Notes (Signed)
Received in report pt with one bladder scan/straight cath completed during day shift.   Bladder scanned pt after 6 hours with 145 mL noted.   Repeated bladder scan after 6 hours with 225 mL noted.   Will report results to oncoming nurse to continue to monitor

## 2020-04-12 DIAGNOSIS — F0150 Vascular dementia without behavioral disturbance: Secondary | ICD-10-CM

## 2020-04-12 LAB — HEMOGLOBIN AND HEMATOCRIT, BLOOD
HCT: 27 % — ABNORMAL LOW (ref 36.0–46.0)
Hemoglobin: 9.1 g/dL — ABNORMAL LOW (ref 12.0–15.0)

## 2020-04-12 LAB — CBC
HCT: 23.4 % — ABNORMAL LOW (ref 36.0–46.0)
Hemoglobin: 7.5 g/dL — ABNORMAL LOW (ref 12.0–15.0)
MCH: 26.9 pg (ref 26.0–34.0)
MCHC: 32.1 g/dL (ref 30.0–36.0)
MCV: 83.9 fL (ref 80.0–100.0)
Platelets: 270 10*3/uL (ref 150–400)
RBC: 2.79 MIL/uL — ABNORMAL LOW (ref 3.87–5.11)
RDW: 15.1 % (ref 11.5–15.5)
WBC: 9 10*3/uL (ref 4.0–10.5)
nRBC: 0 % (ref 0.0–0.2)

## 2020-04-12 LAB — GLUCOSE, CAPILLARY
Glucose-Capillary: 101 mg/dL — ABNORMAL HIGH (ref 70–99)
Glucose-Capillary: 110 mg/dL — ABNORMAL HIGH (ref 70–99)
Glucose-Capillary: 96 mg/dL (ref 70–99)

## 2020-04-12 LAB — PREPARE RBC (CROSSMATCH)

## 2020-04-12 MED ORDER — QUETIAPINE FUMARATE 25 MG PO TABS: 12.5000 mg | ORAL_TABLET | Freq: Every day | ORAL | Status: DC

## 2020-04-12 MED ORDER — POLYETHYLENE GLYCOL 3350 17 G PO PACK: 17.0000 g | PACK | Freq: Every day | ORAL | Status: DC | PRN

## 2020-04-12 MED ORDER — SODIUM CHLORIDE 0.9% IV SOLUTION: Freq: Once | INTRAVENOUS | Status: AC

## 2020-04-12 MED ORDER — QUETIAPINE FUMARATE 25 MG PO TABS
12.5000 mg | ORAL_TABLET | Freq: Every day | ORAL | Status: DC
  Administered 2020-04-12 – 2020-04-14 (×3): 12.5 mg via ORAL
  Filled 2020-04-12 (×3): qty 1

## 2020-04-12 NOTE — Progress Notes (Signed)
[facility]                                                                              Patient Demographics  Lisa Castillo, is a 87 y.o. female, DOB - 1928-07-04, IFO:[id number]  Admit date - 04/07/2020   Admitting Physician Eben Burow, MD  Outpatient Primary MD for the patient is [name], MD  Outpatient specialists:   LOS - 5  days   Medical records reviewed and are as summarized below:    Chief Complaint  Patient presents with  . Fall       Brief summary   [name] a 87 year old female with history of HTN, hypothyroidism, dementia who presents by EMS after a fall at facility.   The patient is apparently in a wheelchair from a previous spinal surgery but is able to ambulate at times.  Patient has an alarm on her wheelchair and somehow she went outside and attempted to ambulate.  Patient stepped off of a curb and fell onto her left side.  She did hit her head, no report of loss of consciousness. Patient was found to have left distal radius fracture and left hip fracture on the x-rays.  Head CT and neck CT were negative for any acute fracture Orthopedics consulted. Left wrist fracture was splinted in ED.  2/9: Plan for left hip fracture repair today  Assessment & Plan     Principal Problem:   Closed left hip fracture, initial encounter [facility]) -Orthopedics was consulted, underwent ORIF of intertrochanteric fracture on 2/9, postop day # 3 -Alert and oriented today.  However had issues with sundowning last night, patient daughter requested resuming Seroquel  -PT recommended skilled nursing Saturday -Hemoglobin 7.5, will transfuse 1 unit packed RBCs, discussed with the patient and the daughter at the bedside  Dementia with sundowning -Resumed Seroquel 12.5 mg at bedtime -If patient continues to have behavioral issues or agitation with sundowning, will resume Depakote  Acute blood loss anemia on chronic anemia -Hemoglobin 7.5, slowly trending  down, postop, will transfuse 1 unit packed RBCs  Acute urinary retention -Patient developed urinary retention on 2/10 evening, placed on Flomax for 7 days, straight cath x3 -Patient did have some voiding on 2/11 evening however started retaining again overnight.  Will place Foley  Constipation -Resolved, titrate down the stool softeners and laxatives, had BMs yesterday     HTN (hypertension) -BP currently stable, continue losartan, metoprolol     Unspecified fracture of the lower end of left radius -  Underwent closed reduction of the left distal fracture with splint, postop day #3 -Currently in sling    Diabetes mellitus type 2 in nonobese ([place]), NIDDM -Continue to hold Amaryl.  -CBGs currently controlled, continue SSI - Hemoglobin A1c 5.5  Mechanical fall as cause of accidental injury in residential institution as place of occurrence   -PT OT evaluation recommended skilled nursing [facility]  Hypothyroidism -Continue Synthroid   Code Status: DNR status DVT Prophylaxis:  enoxaparin (LOVENOX) injection 40 mg Start: 04/10/20 1700 SCDs Start: 04/09/20 2019 SCDs Start: 04/07/20 2254   Level of Care: Level of care: Med-Surg Family Communication: Discussed  all imaging results, lab results, explained to the patient's daughter at the bedside on 2/9, 2/10, 2/11 and 2/12 today  Disposition Plan:     Status is: Inpatient  Remains inpatient appropriate because:Inpatient level of care appropriate due to severity of illness   Dispo: The patient is from: SNF              Anticipated d/c is to: SNF              Anticipated d/c date is: > 3 days              Patient currently is not medically stable to d/c.  Acute urinary retention   difficult to place patient No      Time Spent in minutes 35 minutes  Procedures:    Consultants:   Orthopedics  Antimicrobials:   Anti-infectives (From admission, onward)   Start     Dose/Rate Route Frequency Ordered Stop   04/09/20 2200   ceFAZolin (ANCEF) IVPB 2g/100 mL premix        2 g 200 mL/hr over 30 Minutes Intravenous Every 6 hours 04/09/20 2018 04/10/20 1016   04/09/20 0600  ceFAZolin (ANCEF) IVPB 2g/100 mL premix        2 g 200 mL/hr over 30 Minutes Intravenous On call to O.R. 04/09/20 0327 04/09/20 1537         Medications  Scheduled Meds: . atorvastatin  10 mg Oral Daily  . Chlorhexidine Gluconate Cloth  6 each Topical Daily  . docusate sodium  100 mg Oral BID  . enoxaparin (LOVENOX) injection  40 mg Subcutaneous Q24H  . insulin aspart  0-5 Units Subcutaneous QHS  . insulin aspart  0-9 Units Subcutaneous TID WC  . levothyroxine  75 mcg Oral QAC breakfast  . losartan  25 mg Oral Daily  . metoprolol tartrate  12.5 mg Oral BID  . sertraline  50 mg Oral Daily  . tamsulosin  0.4 mg Oral Daily   Continuous Infusions: . methocarbamol (ROBAXIN) IV     PRN Meds:.acetaminophen, alum & mag hydroxide-simeth, HYDROmorphone (DILAUDID) injection, menthol-cetylpyridinium **OR** phenol, methocarbamol **OR** methocarbamol (ROBAXIN) IV, ondansetron **OR** ondansetron (ZOFRAN) IV, oxyCODONE, polyethylene glycol, sorbitol      Subjective:   Lisa Castillo was seen and examined today.  Overnight acute urinary retention again.  Constipation resolved.  Bladder scan this morning with 500cc, discussed about Foley catheter.  Alert and oriented, currently.  Per daughter at the bedside, overnight issues with agitation and sundowning.   Objective:   Vitals:   04/12/20 0820 04/12/20 1214 04/12/20 1248 04/12/20 1335  BP: (!) 136/54 (!) 117/56 (!) 121/54 127/61  Pulse: 97 96 96 97  Resp: 17 18 18 17   Temp: 98.7 F (37.1 C) 98.6 F (37 C) 98.1 F (36.7 C) 98.1 F (36.7 C)  TempSrc: Oral Oral Oral Oral  SpO2: 97% 94% 97% 100%  Weight:      Height:        Intake/Output Summary (Last 24 hours) at 04/12/2020 1344 Last data filed at 04/12/2020 0600 Gross per 24 hour  Intake 240 ml  Output 150 ml  Net 90 ml     Wt  Readings from Last 3 Encounters:  04/09/20 63.5 kg  03/21/20 63.7 kg  03/20/20 63.7 kg   Physical Exam  General: Alert and oriented x 3, NAD  Cardiovascular: S1 S2 clear, RRR. No pedal edema b/l  Respiratory: CTAB, no wheezing, rales or rhonchi  Gastrointestinal: Soft, nontender, nondistended, NBS  Ext: no pedal edema bilaterally, left arm in sling  Neuro: no new deficits  Musculoskeletal: No cyanosis, clubbing  Skin: No rashes  Psych: Normal affect and demeanor, alert and oriented x3      Data Reviewed:  I have personally reviewed following labs and imaging studies  Micro Results Recent Results (from the past 240 hour(s))  SARS CORONAVIRUS 2 (TAT 6-24 HRS) Nasopharyngeal Nasopharyngeal Swab     Status: None   Collection Time: 04/07/20  7:34 PM   Specimen: Nasopharyngeal Swab  Result Value Ref Range Status   SARS Coronavirus 2 NEGATIVE NEGATIVE Final    Comment: (NOTE) SARS-CoV-2 target nucleic acids are NOT DETECTED.  The SARS-CoV-2 RNA is generally detectable in upper and lower respiratory specimens during the acute phase of infection. Negative results do not preclude SARS-CoV-2 infection, do not rule out co-infections with other pathogens, and should not be used as the sole basis for treatment or other patient management decisions. Negative results must be combined with clinical observations, patient history, and epidemiological information. The expected result is Negative.  Fact Sheet for Patients: SugarRoll.be  Fact Sheet for Healthcare Providers: https://www.woods-mathews.com/  This test is not yet approved or cleared by the [place] FDA and  has been authorized for detection and/or diagnosis of SARS-CoV-2 by FDA under an Emergency Use Authorization (EUA). This EUA will remain  in effect (meaning this test can be used) for the duration of the COVID-19 declaration under Se ction 564(b)(1) of the Act, 21  U.S.C. section 360bbb-3(b)(1), unless the authorization is terminated or revoked sooner.  Performed at [facility], [place] [address] [place], [place] [address]   MRSA PCR Screening     Status: None   Collection Time: 04/09/20 12:20 PM   Specimen: Nasal Mucosa; Nasopharyngeal  Result Value Ref Range Status   MRSA by PCR NEGATIVE NEGATIVE Final    Comment:        The GeneXpert MRSA Assay (FDA approved for NASAL specimens only), is one component of a comprehensive MRSA colonization surveillance program. It is not intended to diagnose MRSA infection nor to guide or monitor treatment for MRSA infections. Performed at [facility], [place] [address], [place], [place] [address]     Radiology Reports DG Chest 1 View  Result Date: 04/07/2020 CLINICAL DATA:  Found down, dementia EXAM: CHEST  1 VIEW COMPARISON:  None. FINDINGS: Single frontal view of the chest demonstrates an unremarkable cardiac silhouette. No airspace disease, effusion, or pneumothorax. No acute bony abnormalities. IMPRESSION: 1. No acute intrathoracic process. Electronically Signed   By: Randa Ngo M.D.   On: 04/07/2020 20:24   DG Wrist Complete Left  Result Date: 04/09/2020 CLINICAL DATA:  Close reduction left wrist fracture EXAM: LEFT WRIST - COMPLETE 3+ VIEW COMPARISON:  04/07/2020 FINDINGS: Two fluoroscopic images are obtained during the performance of the procedure and are provided for interpretation only. There has been interval reduction of the comminuted distal left radial fracture seen previously, now with near anatomic alignment. Please refer to the operative report. FLUOROSCOPY TIME:  8 seconds IMPRESSION: 1. Reduction of distal left radial fracture with near anatomic alignment. Electronically Signed   By: Randa Ngo M.D.   On: 04/09/2020 18:07   DG Wrist Complete Left  Result Date: 04/07/2020 CLINICAL DATA:  Post reduction film of known left wrist fracture. EXAM: LEFT WRIST - COMPLETE 3+ VIEW  COMPARISON:  None. FINDINGS: Splinting material obscures fine bony and soft tissue detail. Interval splinting with similar  alignment of the displaced, impacted dorsally angulated fracture through the distal radius. IMPRESSION: Interval splinting with similar alignment of the distal radial fracture. Electronically Signed   By: Dahlia Bailiff MD   On: 04/07/2020 23:11   DG Wrist Complete Left  Result Date: 04/07/2020 CLINICAL DATA:  Pain EXAM: LEFT WRIST - COMPLETE 3+ VIEW COMPARISON:  None. FINDINGS: There is an acute, displaced and impacted fracture through the distal radius with significant dorsal angulation. There is surrounding soft tissue swelling. No frank dislocation. There is an old healed fracture of the fourth metacarpal. There are degenerative changes of the partially visualized interphalangeal joints. IMPRESSION: Acute, displaced, impacted fracture through the distal radius with significant dorsal angulation. Electronically Signed   By: Constance Holster M.D.   On: 04/07/2020 20:24   DG Ankle Complete Left  Result Date: 04/07/2020 CLINICAL DATA:  Pain EXAM: LEFT ANKLE COMPLETE - 3+ VIEW COMPARISON:  None. FINDINGS: There is no evidence of fracture, dislocation, or joint effusion. There is no evidence of arthropathy or other focal bone abnormality. Soft tissues are unremarkable. IMPRESSION: Negative. Electronically Signed   By: Constance Holster M.D.   On: 04/07/2020 20:25   CT HEAD WO CONTRAST  Result Date: 04/07/2020 CLINICAL DATA:  Facial trauma EXAM: CT HEAD WITHOUT CONTRAST TECHNIQUE: Contiguous axial images were obtained from the base of the skull through the vertex without intravenous contrast. COMPARISON:  None. FINDINGS: Brain: No evidence of acute territorial infarction, hemorrhage, hydrocephalus,extra-axial collection or mass lesion/mass effect. There is dilatation the ventricles and sulci consistent with age-related atrophy. Low-attenuation changes in the deep white matter consistent  with small vessel ischemia. Vascular: No hyperdense vessel or unexpected calcification. Skull: The skull is intact. No fracture or focal lesion identified. Mild soft tissue swelling seen over the left frontal skull. Sinuses/Orbits: The visualized paranasal sinuses and mastoid air cells are clear. The orbits and globes intact. Other: None Cervical spine: Alignment: Physiologic Skull base and vertebrae: Visualized skull base is intact. No atlanto-occipital dissociation. The vertebral body heights are well maintained. No fracture or pathologic osseous lesion seen. Soft tissues and spinal canal: The visualized paraspinal soft tissues are unremarkable. No prevertebral soft tissue swelling is seen. The spinal canal is grossly unremarkable, no large epidural collection or significant canal narrowing. Disc levels: Multilevel cervical spine spondylosis seen with disc osteophyte complex and uncovertebral osteophytes most notable at C4-C5 and C5-C6. Upper chest: The lung apices are clear. Thoracic inlet is within normal limits. Other: None IMPRESSION: No acute intracranial abnormality. Findings consistent with age related atrophy and chronic small vessel ischemia No acute fracture or malalignment of the spine. Electronically Signed   By: Prudencio Pair M.D.   On: 04/07/2020 20:43   CT CERVICAL SPINE WO CONTRAST  Result Date: 04/07/2020 CLINICAL DATA:  Facial trauma EXAM: CT HEAD WITHOUT CONTRAST TECHNIQUE: Contiguous axial images were obtained from the base of the skull through the vertex without intravenous contrast. COMPARISON:  None. FINDINGS: Brain: No evidence of acute territorial infarction, hemorrhage, hydrocephalus,extra-axial collection or mass lesion/mass effect. There is dilatation the ventricles and sulci consistent with age-related atrophy. Low-attenuation changes in the deep white matter consistent with small vessel ischemia. Vascular: No hyperdense vessel or unexpected calcification. Skull: The skull is intact.  No fracture or focal lesion identified. Mild soft tissue swelling seen over the left frontal skull. Sinuses/Orbits: The visualized paranasal sinuses and mastoid air cells are clear. The orbits and globes intact. Other: None Cervical spine: Alignment: Physiologic Skull base and vertebrae: Visualized skull base  is intact. No atlanto-occipital dissociation. The vertebral body heights are well maintained. No fracture or pathologic osseous lesion seen. Soft tissues and spinal canal: The visualized paraspinal soft tissues are unremarkable. No prevertebral soft tissue swelling is seen. The spinal canal is grossly unremarkable, no large epidural collection or significant canal narrowing. Disc levels: Multilevel cervical spine spondylosis seen with disc osteophyte complex and uncovertebral osteophytes most notable at C4-C5 and C5-C6. Upper chest: The lung apices are clear. Thoracic inlet is within normal limits. Other: None IMPRESSION: No acute intracranial abnormality. Findings consistent with age related atrophy and chronic small vessel ischemia No acute fracture or malalignment of the spine. Electronically Signed   By: Prudencio Pair M.D.   On: 04/07/2020 20:43   DG C-Arm 1-60 Min  Result Date: 04/09/2020 CLINICAL DATA:  ORIF left femur. EXAM: DG C-ARM 1-60 MIN; LEFT FEMUR 2 VIEWS FLUOROSCOPY TIME:  Fluoroscopy Time:  1 minutes 41 seconds Reported radiation: 17 mGy. COMPARISON:  April 07, 2020 FINDINGS: Four C-arm fluoroscopic images were obtained intraoperatively and submitted for post operative interpretation. These images demonstrate fixation of an inter trochanteric fracture of the left femur with intramedullary nail and screws. Improved, near anatomic alignment. No unexpected findings. Please see the performing provider's procedural report for further detail. IMPRESSION: Intraoperative fluoroscopic imaging, as detailed above. Electronically Signed   By: Margaretha Sheffield MD   On: 04/09/2020 17:10   DG Hip Unilat  With Pelvis 2-3 Views Left  Result Date: 04/07/2020 CLINICAL DATA:  Pain EXAM: DG HIP (WITH OR WITHOUT PELVIS) 2-3V LEFT COMPARISON:  None. FINDINGS: There is an acute, comminuted and displaced intratrochanteric fracture of the proximal left femur. There is no dislocation. There is osteopenia. There are moderate degenerative changes of both hips. IMPRESSION: Acute, comminuted and displaced intratrochanteric fracture of the proximal left femur. Electronically Signed   By: Constance Holster M.D.   On: 04/07/2020 20:24   DG FEMUR MIN 2 VIEWS LEFT  Result Date: 04/09/2020 CLINICAL DATA:  ORIF left femur. EXAM: DG C-ARM 1-60 MIN; LEFT FEMUR 2 VIEWS FLUOROSCOPY TIME:  Fluoroscopy Time:  1 minutes 41 seconds Reported radiation: 17 mGy. COMPARISON:  April 07, 2020 FINDINGS: Four C-arm fluoroscopic images were obtained intraoperatively and submitted for post operative interpretation. These images demonstrate fixation of an inter trochanteric fracture of the left femur with intramedullary nail and screws. Improved, near anatomic alignment. No unexpected findings. Please see the performing provider's procedural report for further detail. IMPRESSION: Intraoperative fluoroscopic imaging, as detailed above. Electronically Signed   By: Margaretha Sheffield MD   On: 04/09/2020 17:10    Lab Data:  CBC: Recent Labs  Lab 04/07/20 1921 04/08/20 0410 04/09/20 0315 04/09/20 2035 04/10/20 0155 04/11/20 0136 04/12/20 0200  WBC 14.1*   < > 15.1* 15.2* 13.3* 10.2 9.0  NEUTROABS 10.0*  --   --   --   --   --   --   HGB 11.3*   < > 10.1* 9.8* 8.9* 8.1* 7.5*  HCT 35.1*   < > 30.8* 30.5* 28.2* 26.2* 23.4*  MCV 82.2   < > 80.8 82.0 82.2 84.8 83.9  PLT 341   < > 347 310 303 276 270   < > = values in this interval not displayed.   Basic Metabolic Panel: Recent Labs  Lab 04/07/20 1921 04/08/20 0410 04/09/20 0315 04/09/20 2035 04/10/20 0155 04/11/20 0136  NA 139 139 138  --  138 141  K 4.1 4.5 3.9  --  3.9 3.5  CL 102 105 104  --  104 108  CO2 23 24 25   --  23 23  GLUCOSE 147* 171* 134*  --  150* 106*  BUN 27* 20 12  --  12 16  CREATININE 0.70 0.58 0.56 0.65 0.62 0.57  CALCIUM 8.7* 8.7* 8.8*  --  8.5* 8.0*   GFR: Estimated Creatinine Clearance: 39.7 mL/min (by C-G formula based on SCr of 0.57 mg/dL). Liver Function Tests: No results for input(s): AST, ALT, ALKPHOS, BILITOT, PROT, ALBUMIN in the last 168 hours. No results for input(s): LIPASE, AMYLASE in the last 168 hours. No results for input(s): AMMONIA in the last 168 hours. Coagulation Profile: Recent Labs  Lab 04/07/20 1921  INR 1.0   Cardiac Enzymes: No results for input(s): CKTOTAL, CKMB, CKMBINDEX, TROPONINI in the last 168 hours. BNP (last 3 results) No results for input(s): PROBNP in the last 8760 hours. HbA1C: No results for input(s): HGBA1C in the last 72 hours. CBG: Recent Labs  Lab 04/11/20 1130 04/11/20 1622 04/11/20 2114 04/12/20 0641 04/12/20 1127  GLUCAP 93 122* 143* 101* 110*   Lipid Profile: No results for input(s): CHOL, HDL, LDLCALC, TRIG, CHOLHDL, LDLDIRECT in the last 72 hours. Thyroid Function Tests: No results for input(s): TSH, T4TOTAL, FREET4, T3FREE, THYROIDAB in the last 72 hours. Anemia Panel: No results for input(s): VITAMINB12, FOLATE, FERRITIN, TIBC, IRON, RETICCTPCT in the last 72 hours. Urine analysis: No results found for: COLORURINE, APPEARANCEUR, LABSPEC, PHURINE, GLUCOSEU, HGBUR, BILIRUBINUR, KETONESUR, PROTEINUR, UROBILINOGEN, NITRITE, LEUKOCYTESUR   Lisa Castillo M.D. [facility] 04/12/2020, 1:44 PM   Call night coverage person covering after 7pm

## 2020-04-12 NOTE — Plan of Care (Signed)
  Problem: Education: Goal: Knowledge of General Education information will improve Description: Including pain rating scale, medication(s)/side effects and non-pharmacologic comfort measures Outcome: Progressing   Problem: Clinical Measurements: Goal: Ability to maintain clinical measurements within normal limits will improve Outcome: Progressing   Problem: Nutrition: Goal: Adequate nutrition will be maintained Outcome: Progressing   Problem: Coping: Goal: Level of anxiety will decrease Outcome: Progressing   Problem: Pain Managment: Goal: General experience of comfort will improve Outcome: Progressing   Problem: Health Behavior/Discharge Planning: Goal: Ability to manage health-related needs will improve Outcome: Not Progressing   Problem: Activity: Goal: Risk for activity intolerance will decrease Outcome: Not Progressing   Problem: Elimination: Goal: Will not experience complications related to urinary retention Outcome: Not Progressing

## 2020-04-13 ENCOUNTER — Inpatient Hospital Stay (HOSPITAL_COMMUNITY): Payer: Medicare Other

## 2020-04-13 LAB — BPAM RBC
Blood Product Expiration Date: 202202242359
ISSUE DATE / TIME: 202202121226
Unit Type and Rh: 600

## 2020-04-13 LAB — GLUCOSE, CAPILLARY
Glucose-Capillary: 105 mg/dL — ABNORMAL HIGH (ref 70–99)
Glucose-Capillary: 106 mg/dL — ABNORMAL HIGH (ref 70–99)
Glucose-Capillary: 110 mg/dL — ABNORMAL HIGH (ref 70–99)
Glucose-Capillary: 145 mg/dL — ABNORMAL HIGH (ref 70–99)
Glucose-Capillary: 160 mg/dL — ABNORMAL HIGH (ref 70–99)

## 2020-04-13 LAB — TYPE AND SCREEN
ABO/RH(D): A NEG
Antibody Screen: NEGATIVE
Unit division: 0

## 2020-04-13 NOTE — Plan of Care (Signed)
  Problem: Education: Goal: Knowledge of General Education information will improve Description: Including pain rating scale, medication(s)/side effects and non-pharmacologic comfort measures Outcome: Progressing   Problem: Health Behavior/Discharge Planning: Goal: Ability to manage health-related needs will improve Outcome: Progressing   Problem: Activity: Goal: Risk for activity intolerance will decrease Outcome: Progressing   Problem: Coping: Goal: Level of anxiety will decrease Outcome: Progressing   Problem: Elimination: Goal: Will not experience complications related to bowel motility Outcome: Progressing   Problem: Safety: Goal: Ability to remain free from injury will improve Outcome: Progressing   Problem: Skin Integrity: Goal: Risk for impaired skin integrity will decrease Outcome: Progressing

## 2020-04-13 NOTE — Progress Notes (Signed)
[facility]                                                                              Patient Demographics  Lisa Castillo, is a 87 y.o. female, DOB - February 07, 1929, TDV:[id number]  Admit date - 04/07/2020   Admitting Physician Eben Burow, MD  Outpatient Primary MD for the patient is [name], MD  Outpatient specialists:   LOS - 6  days   Medical records reviewed and are as summarized below:    Chief Complaint  Patient presents with  . Fall       Brief summary   [name] a 87 year old female with history of HTN, hypothyroidism, dementia who presents by EMS after a fall at facility.   The patient is apparently in a wheelchair from a previous spinal surgery but is able to ambulate at times.  Patient has an alarm on her wheelchair and somehow she went outside and attempted to ambulate.  Patient stepped off of a curb and fell onto her left side.  She did hit her head, no report of loss of consciousness. Patient was found to have left distal radius fracture and left hip fracture on the x-rays.  Head CT and neck CT were negative for any acute fracture Orthopedics consulted. Left wrist fracture was splinted in ED.  2/9: Plan for left hip fracture repair today  Assessment & Plan     Principal Problem:   Closed left hip fracture, initial encounter [facility]) -Orthopedics was consulted, underwent ORIF of intertrochanteric fracture on 2/9, postop day # 4 -Alert and oriented, mental status much better after receiving Seroquel last night.   -Status post Foley placement on 2/12 for urinary retention -Hb 7.5 on 2/12, received packed RBC transfusion.  Hb 9.1 after transfusion  Dementia with sundowning -Resumed Seroquel 12.5 mg at bedtime.  No acute complaints.  Will hold off on Depakote  Acute blood loss anemia on chronic anemia -Hemoglobin now stable, 9.1  Acute urinary retention -Patient developed urinary retention on 2/10 evening, placed on Flomax for 7  days, straight cath x3, placed Foley cath on 2/12  Constipation Resolved     HTN (hypertension) -BP currently stable, continue losartan, metoprolol     Unspecified fracture of the lower end of left radius -  Underwent closed reduction of the left distal fracture with splint, postop day #4 -Currently in sling    Diabetes mellitus type 2 in nonobese ([place]), NIDDM -Continue to hold Amaryl.  -CBGs currently controlled, continue SSI - Hemoglobin A1c 5.5  Mechanical fall as cause of accidental injury in residential institution as place of occurrence   -PT OT evaluation recommended skilled nursing [facility]  Hypothyroidism -Continue Synthroid  Right shoulder pain -X-ray showed early glenohumeral osteoarthritis, otherwise no acute osseous abnormality -Continue pain control, ice pack   Code Status: DNR status DVT Prophylaxis:  enoxaparin (LOVENOX) injection 40 mg Start: 04/10/20 1700 SCDs Start: 04/09/20 2019 SCDs Start: 04/07/20 2254   Level of Care: Level of care: Med-Surg Family Communication: Discussed all imaging results, lab results, explained to the patient's daughter at the bedside on 2/9, 2/10, 2/11, 2/12, and 2/13  today  Disposition Plan:     Status is: Inpatient  Remains inpatient appropriate because:Inpatient level of care appropriate due to severity of illness   Dispo: The patient is from: SNF              Anticipated d/c is to: SNF              Anticipated d/c date is: > 3 days              Patient currently is not medically stable to d/c.  Acute urinary retention   difficult to place patient No   Time Spent in minutes 35 minutes  Procedures:    Consultants:   Orthopedics  Antimicrobials:   Anti-infectives (From admission, onward)   Start     Dose/Rate Route Frequency Ordered Stop   04/09/20 2200  ceFAZolin (ANCEF) IVPB 2g/100 mL premix        2 g 200 mL/hr over 30 Minutes Intravenous Every 6 hours 04/09/20 2018 04/10/20 1016   04/09/20 0600   ceFAZolin (ANCEF) IVPB 2g/100 mL premix        2 g 200 mL/hr over 30 Minutes Intravenous On call to O.R. 04/09/20 0327 04/09/20 1537         Medications  Scheduled Meds: . atorvastatin  10 mg Oral Daily  . Chlorhexidine Gluconate Cloth  6 each Topical Daily  . docusate sodium  100 mg Oral BID  . enoxaparin (LOVENOX) injection  40 mg Subcutaneous Q24H  . insulin aspart  0-5 Units Subcutaneous QHS  . insulin aspart  0-9 Units Subcutaneous TID WC  . levothyroxine  75 mcg Oral QAC breakfast  . losartan  25 mg Oral Daily  . metoprolol tartrate  12.5 mg Oral BID  . QUEtiapine  12.5 mg Oral q1800  . sertraline  50 mg Oral Daily  . tamsulosin  0.4 mg Oral Daily   Continuous Infusions: . methocarbamol (ROBAXIN) IV     PRN Meds:.acetaminophen, alum & mag hydroxide-simeth, HYDROmorphone (DILAUDID) injection, menthol-cetylpyridinium **OR** phenol, methocarbamol **OR** methocarbamol (ROBAXIN) IV, ondansetron **OR** ondansetron (ZOFRAN) IV, oxyCODONE, polyethylene glycol      Subjective:   Lisa Castillo was seen and examined today.  Slept better overnight, much more alert and oriented today.  Foley placed.  Complaining of pain in the right shoulder, worse on abduction.  Ice pack helping   Objective:   Vitals:   04/12/20 1824 04/12/20 1936 04/13/20 0500 04/13/20 0900  BP:  (!) 116/45 127/66 (!) 142/65  Pulse:  (!) 108 98 97  Resp:  16 16 17   Temp:  98.2 F (36.8 C) 98.6 F (37 C) 98.8 F (37.1 C)  TempSrc:  Oral Oral Oral  SpO2: 96% 96% 95% 93%  Weight:      Height:        Intake/Output Summary (Last 24 hours) at 04/13/2020 1303 Last data filed at 04/13/2020 0500 Gross per 24 hour  Intake 340.5 ml  Output 800 ml  Net -459.5 ml     Wt Readings from Last 3 Encounters:  04/09/20 63.5 kg  03/21/20 63.7 kg  03/20/20 63.7 kg   Physical Exam  General: Alert and oriented x 3, NAD  Cardiovascular: S1 S2 clear, RRR. No pedal edema b/l  Respiratory: CTAB, no wheezing,  rales or rhonchi  Gastrointestinal: Soft, nontender, nondistended, NBS  Ext: no pedal edema bilaterally, left arm in sling.  Mild ROM decreased on abduction right shoulder  Neuro: no new deficits  Musculoskeletal: No cyanosis,  clubbing  Skin: No rashes  Psych: Normal affect and demeanor, alert and oriented x3       Data Reviewed:  I have personally reviewed following labs and imaging studies  Micro Results Recent Results (from the past 240 hour(s))  SARS CORONAVIRUS 2 (TAT 6-24 HRS) Nasopharyngeal Nasopharyngeal Swab     Status: None   Collection Time: 04/07/20  7:34 PM   Specimen: Nasopharyngeal Swab  Result Value Ref Range Status   SARS Coronavirus 2 NEGATIVE NEGATIVE Final    Comment: (NOTE) SARS-CoV-2 target nucleic acids are NOT DETECTED.  The SARS-CoV-2 RNA is generally detectable in upper and lower respiratory specimens during the acute phase of infection. Negative results do not preclude SARS-CoV-2 infection, do not rule out co-infections with other pathogens, and should not be used as the sole basis for treatment or other patient management decisions. Negative results must be combined with clinical observations, patient history, and epidemiological information. The expected result is Negative.  Fact Sheet for Patients: SugarRoll.be  Fact Sheet for Healthcare Providers: https://www.woods-mathews.com/  This test is not yet approved or cleared by the [place] FDA and  has been authorized for detection and/or diagnosis of SARS-CoV-2 by FDA under an Emergency Use Authorization (EUA). This EUA will remain  in effect (meaning this test can be used) for the duration of the COVID-19 declaration under Se ction 564(b)(1) of the Act, 21 U.S.C. section 360bbb-3(b)(1), unless the authorization is terminated or revoked sooner.  Performed at [facility], [employer] [address], [place], [place] [address]   MRSA PCR Screening      Status: None   Collection Time: 04/09/20 12:20 PM   Specimen: Nasal Mucosa; Nasopharyngeal  Result Value Ref Range Status   MRSA by PCR NEGATIVE NEGATIVE Final    Comment:        The GeneXpert MRSA Assay (FDA approved for NASAL specimens only), is one component of a comprehensive MRSA colonization surveillance program. It is not intended to diagnose MRSA infection nor to guide or monitor treatment for MRSA infections. Performed at [facility], [place] [address] [place], [place] [address]     Radiology Reports DG Chest 1 View  Result Date: 04/07/2020 CLINICAL DATA:  Found down, dementia EXAM: CHEST  1 VIEW COMPARISON:  None. FINDINGS: Single frontal view of the chest demonstrates an unremarkable cardiac silhouette. No airspace disease, effusion, or pneumothorax. No acute bony abnormalities. IMPRESSION: 1. No acute intrathoracic process. Electronically Signed   By: Randa Ngo M.D.   On: 04/07/2020 20:24   DG Wrist Complete Left  Result Date: 04/09/2020 CLINICAL DATA:  Close reduction left wrist fracture EXAM: LEFT WRIST - COMPLETE 3+ VIEW COMPARISON:  04/07/2020 FINDINGS: Two fluoroscopic images are obtained during the performance of the procedure and are provided for interpretation only. There has been interval reduction of the comminuted distal left radial fracture seen previously, now with near anatomic alignment. Please refer to the operative report. FLUOROSCOPY TIME:  8 seconds IMPRESSION: 1. Reduction of distal left radial fracture with near anatomic alignment. Electronically Signed   By: Randa Ngo M.D.   On: 04/09/2020 18:07   DG Wrist Complete Left  Result Date: 04/07/2020 CLINICAL DATA:  Post reduction film of known left wrist fracture. EXAM: LEFT WRIST - COMPLETE 3+ VIEW COMPARISON:  None. FINDINGS: Splinting material obscures fine bony and soft tissue detail. Interval splinting with similar alignment of the displaced, impacted dorsally angulated fracture through  the distal radius. IMPRESSION: Interval splinting with similar  alignment of the distal radial fracture. Electronically Signed   By: Dahlia Bailiff MD   On: 04/07/2020 23:11   DG Wrist Complete Left  Result Date: 04/07/2020 CLINICAL DATA:  Pain EXAM: LEFT WRIST - COMPLETE 3+ VIEW COMPARISON:  None. FINDINGS: There is an acute, displaced and impacted fracture through the distal radius with significant dorsal angulation. There is surrounding soft tissue swelling. No frank dislocation. There is an old healed fracture of the fourth metacarpal. There are degenerative changes of the partially visualized interphalangeal joints. IMPRESSION: Acute, displaced, impacted fracture through the distal radius with significant dorsal angulation. Electronically Signed   By: Constance Holster M.D.   On: 04/07/2020 20:24   DG Ankle Complete Left  Result Date: 04/07/2020 CLINICAL DATA:  Pain EXAM: LEFT ANKLE COMPLETE - 3+ VIEW COMPARISON:  None. FINDINGS: There is no evidence of fracture, dislocation, or joint effusion. There is no evidence of arthropathy or other focal bone abnormality. Soft tissues are unremarkable. IMPRESSION: Negative. Electronically Signed   By: Constance Holster M.D.   On: 04/07/2020 20:25   CT HEAD WO CONTRAST  Result Date: 04/07/2020 CLINICAL DATA:  Facial trauma EXAM: CT HEAD WITHOUT CONTRAST TECHNIQUE: Contiguous axial images were obtained from the base of the skull through the vertex without intravenous contrast. COMPARISON:  None. FINDINGS: Brain: No evidence of acute territorial infarction, hemorrhage, hydrocephalus,extra-axial collection or mass lesion/mass effect. There is dilatation the ventricles and sulci consistent with age-related atrophy. Low-attenuation changes in the deep white matter consistent with small vessel ischemia. Vascular: No hyperdense vessel or unexpected calcification. Skull: The skull is intact. No fracture or focal lesion identified. Mild soft tissue swelling seen over the  left frontal skull. Sinuses/Orbits: The visualized paranasal sinuses and mastoid air cells are clear. The orbits and globes intact. Other: None Cervical spine: Alignment: Physiologic Skull base and vertebrae: Visualized skull base is intact. No atlanto-occipital dissociation. The vertebral body heights are well maintained. No fracture or pathologic osseous lesion seen. Soft tissues and spinal canal: The visualized paraspinal soft tissues are unremarkable. No prevertebral soft tissue swelling is seen. The spinal canal is grossly unremarkable, no large epidural collection or significant canal narrowing. Disc levels: Multilevel cervical spine spondylosis seen with disc osteophyte complex and uncovertebral osteophytes most notable at C4-C5 and C5-C6. Upper chest: The lung apices are clear. Thoracic inlet is within normal limits. Other: None IMPRESSION: No acute intracranial abnormality. Findings consistent with age related atrophy and chronic small vessel ischemia No acute fracture or malalignment of the spine. Electronically Signed   By: Prudencio Pair M.D.   On: 04/07/2020 20:43   CT CERVICAL SPINE WO CONTRAST  Result Date: 04/07/2020 CLINICAL DATA:  Facial trauma EXAM: CT HEAD WITHOUT CONTRAST TECHNIQUE: Contiguous axial images were obtained from the base of the skull through the vertex without intravenous contrast. COMPARISON:  None. FINDINGS: Brain: No evidence of acute territorial infarction, hemorrhage, hydrocephalus,extra-axial collection or mass lesion/mass effect. There is dilatation the ventricles and sulci consistent with age-related atrophy. Low-attenuation changes in the deep white matter consistent with small vessel ischemia. Vascular: No hyperdense vessel or unexpected calcification. Skull: The skull is intact. No fracture or focal lesion identified. Mild soft tissue swelling seen over the left frontal skull. Sinuses/Orbits: The visualized paranasal sinuses and mastoid air cells are clear. The orbits  and globes intact. Other: None Cervical spine: Alignment: Physiologic Skull base and vertebrae: Visualized skull base is intact. No atlanto-occipital dissociation. The vertebral body heights are well maintained. No fracture or pathologic osseous  lesion seen. Soft tissues and spinal canal: The visualized paraspinal soft tissues are unremarkable. No prevertebral soft tissue swelling is seen. The spinal canal is grossly unremarkable, no large epidural collection or significant canal narrowing. Disc levels: Multilevel cervical spine spondylosis seen with disc osteophyte complex and uncovertebral osteophytes most notable at C4-C5 and C5-C6. Upper chest: The lung apices are clear. Thoracic inlet is within normal limits. Other: None IMPRESSION: No acute intracranial abnormality. Findings consistent with age related atrophy and chronic small vessel ischemia No acute fracture or malalignment of the spine. Electronically Signed   By: Prudencio Pair M.D.   On: 04/07/2020 20:43   DG Shoulder Right Port  Result Date: 04/13/2020 CLINICAL DATA:  Right shoulder pain. EXAM: PORTABLE RIGHT SHOULDER COMPARISON:  None. FINDINGS: No acute fracture or dislocation. Small well corticated ossific density near the superior glenoid, potentially related to old trauma versus small intra-articular body. Old healed fracture of the right mid clavicle. Small humeral head marginal osteophytes. Joint spaces are preserved. Soft tissues are unremarkable. IMPRESSION: 1. Early glenohumeral osteoarthritis. No acute osseous abnormality. Electronically Signed   By: Titus Dubin M.D.   On: 04/13/2020 12:01   DG C-Arm 1-60 Min  Result Date: 04/09/2020 CLINICAL DATA:  ORIF left femur. EXAM: DG C-ARM 1-60 MIN; LEFT FEMUR 2 VIEWS FLUOROSCOPY TIME:  Fluoroscopy Time:  1 minutes 41 seconds Reported radiation: 17 mGy. COMPARISON:  April 07, 2020 FINDINGS: Four C-arm fluoroscopic images were obtained intraoperatively and submitted for post operative  interpretation. These images demonstrate fixation of an inter trochanteric fracture of the left femur with intramedullary nail and screws. Improved, near anatomic alignment. No unexpected findings. Please see the performing provider's procedural report for further detail. IMPRESSION: Intraoperative fluoroscopic imaging, as detailed above. Electronically Signed   By: Margaretha Sheffield MD   On: 04/09/2020 17:10   DG Hip Unilat With Pelvis 2-3 Views Left  Result Date: 04/07/2020 CLINICAL DATA:  Pain EXAM: DG HIP (WITH OR WITHOUT PELVIS) 2-3V LEFT COMPARISON:  None. FINDINGS: There is an acute, comminuted and displaced intratrochanteric fracture of the proximal left femur. There is no dislocation. There is osteopenia. There are moderate degenerative changes of both hips. IMPRESSION: Acute, comminuted and displaced intratrochanteric fracture of the proximal left femur. Electronically Signed   By: Constance Holster M.D.   On: 04/07/2020 20:24   DG FEMUR MIN 2 VIEWS LEFT  Result Date: 04/09/2020 CLINICAL DATA:  ORIF left femur. EXAM: DG C-ARM 1-60 MIN; LEFT FEMUR 2 VIEWS FLUOROSCOPY TIME:  Fluoroscopy Time:  1 minutes 41 seconds Reported radiation: 17 mGy. COMPARISON:  April 07, 2020 FINDINGS: Four C-arm fluoroscopic images were obtained intraoperatively and submitted for post operative interpretation. These images demonstrate fixation of an inter trochanteric fracture of the left femur with intramedullary nail and screws. Improved, near anatomic alignment. No unexpected findings. Please see the performing provider's procedural report for further detail. IMPRESSION: Intraoperative fluoroscopic imaging, as detailed above. Electronically Signed   By: Margaretha Sheffield MD   On: 04/09/2020 17:10    Lab Data:  CBC: Recent Labs  Lab 04/07/20 1921 04/08/20 0410 04/09/20 0315 04/09/20 2035 04/10/20 0155 04/11/20 0136 04/12/20 0200 04/12/20 1841  WBC 14.1*   < > 15.1* 15.2* 13.3* 10.2 9.0  --   NEUTROABS  10.0*  --   --   --   --   --   --   --   HGB 11.3*   < > 10.1* 9.8* 8.9* 8.1* 7.5* 9.1*  HCT 35.1*   < >  30.8* 30.5* 28.2* 26.2* 23.4* 27.0*  MCV 82.2   < > 80.8 82.0 82.2 84.8 83.9  --   PLT 341   < > 347 310 303 276 270  --    < > = values in this interval not displayed.   Basic Metabolic Panel: Recent Labs  Lab 04/07/20 1921 04/08/20 0410 04/09/20 0315 04/09/20 2035 04/10/20 0155 04/11/20 0136  NA 139 139 138  --  138 141  K 4.1 4.5 3.9  --  3.9 3.5  CL 102 105 104  --  104 108  CO2 23 24 25   --  23 23  GLUCOSE 147* 171* 134*  --  150* 106*  BUN 27* 20 12  --  12 16  CREATININE 0.70 0.58 0.56 0.65 0.62 0.57  CALCIUM 8.7* 8.7* 8.8*  --  8.5* 8.0*   GFR: Estimated Creatinine Clearance: 39.7 mL/min (by C-G formula based on SCr of 0.57 mg/dL). Liver Function Tests: No results for input(s): AST, ALT, ALKPHOS, BILITOT, PROT, ALBUMIN in the last 168 hours. No results for input(s): LIPASE, AMYLASE in the last 168 hours. No results for input(s): AMMONIA in the last 168 hours. Coagulation Profile: Recent Labs  Lab 04/07/20 1921  INR 1.0   Cardiac Enzymes: No results for input(s): CKTOTAL, CKMB, CKMBINDEX, TROPONINI in the last 168 hours. BNP (last 3 results) No results for input(s): PROBNP in the last 8760 hours. HbA1C: No results for input(s): HGBA1C in the last 72 hours. CBG: Recent Labs  Lab 04/12/20 1127 04/12/20 1718 04/13/20 0338 04/13/20 0618 04/13/20 1202  GLUCAP 110* 96 105* 106* 110*   Lipid Profile: No results for input(s): CHOL, HDL, LDLCALC, TRIG, CHOLHDL, LDLDIRECT in the last 72 hours. Thyroid Function Tests: No results for input(s): TSH, T4TOTAL, FREET4, T3FREE, THYROIDAB in the last 72 hours. Anemia Panel: No results for input(s): VITAMINB12, FOLATE, FERRITIN, TIBC, IRON, RETICCTPCT in the last 72 hours. Urine analysis: No results found for: COLORURINE, APPEARANCEUR, LABSPEC, PHURINE, GLUCOSEU, HGBUR, BILIRUBINUR, KETONESUR, PROTEINUR,  UROBILINOGEN, NITRITE, LEUKOCYTESUR   Barbarann Kelly M.D. [facility] 04/13/2020, 1:03 PM   Call night coverage person covering after 7pm

## 2020-04-14 DIAGNOSIS — F05 Delirium due to known physiological condition: Secondary | ICD-10-CM | POA: Diagnosis not present

## 2020-04-14 DIAGNOSIS — R339 Retention of urine, unspecified: Secondary | ICD-10-CM | POA: Diagnosis not present

## 2020-04-14 DIAGNOSIS — S52502A Unspecified fracture of the lower end of left radius, initial encounter for closed fracture: Secondary | ICD-10-CM | POA: Diagnosis not present

## 2020-04-14 DIAGNOSIS — E1169 Type 2 diabetes mellitus with other specified complication: Secondary | ICD-10-CM | POA: Diagnosis not present

## 2020-04-14 DIAGNOSIS — E782 Mixed hyperlipidemia: Secondary | ICD-10-CM | POA: Diagnosis not present

## 2020-04-14 DIAGNOSIS — I10 Essential (primary) hypertension: Secondary | ICD-10-CM | POA: Diagnosis not present

## 2020-04-14 DIAGNOSIS — F0390 Unspecified dementia without behavioral disturbance: Secondary | ICD-10-CM | POA: Diagnosis not present

## 2020-04-14 DIAGNOSIS — R000 Tachycardia, unspecified: Secondary | ICD-10-CM | POA: Diagnosis not present

## 2020-04-14 DIAGNOSIS — K5901 Slow transit constipation: Secondary | ICD-10-CM | POA: Diagnosis not present

## 2020-04-14 DIAGNOSIS — E039 Hypothyroidism, unspecified: Secondary | ICD-10-CM | POA: Diagnosis not present

## 2020-04-14 DIAGNOSIS — S52502D Unspecified fracture of the lower end of left radius, subsequent encounter for closed fracture with routine healing: Secondary | ICD-10-CM | POA: Diagnosis not present

## 2020-04-14 DIAGNOSIS — N83202 Unspecified ovarian cyst, left side: Secondary | ICD-10-CM | POA: Diagnosis not present

## 2020-04-14 DIAGNOSIS — S72002A Fracture of unspecified part of neck of left femur, initial encounter for closed fracture: Secondary | ICD-10-CM | POA: Diagnosis not present

## 2020-04-14 DIAGNOSIS — R531 Weakness: Secondary | ICD-10-CM | POA: Diagnosis not present

## 2020-04-14 DIAGNOSIS — S72002D Fracture of unspecified part of neck of left femur, subsequent encounter for closed fracture with routine healing: Secondary | ICD-10-CM | POA: Diagnosis not present

## 2020-04-14 DIAGNOSIS — E119 Type 2 diabetes mellitus without complications: Secondary | ICD-10-CM | POA: Diagnosis not present

## 2020-04-14 DIAGNOSIS — G4700 Insomnia, unspecified: Secondary | ICD-10-CM | POA: Diagnosis not present

## 2020-04-14 DIAGNOSIS — R0902 Hypoxemia: Secondary | ICD-10-CM | POA: Diagnosis not present

## 2020-04-14 DIAGNOSIS — F0391 Unspecified dementia with behavioral disturbance: Secondary | ICD-10-CM | POA: Diagnosis not present

## 2020-04-14 DIAGNOSIS — R296 Repeated falls: Secondary | ICD-10-CM | POA: Diagnosis not present

## 2020-04-14 DIAGNOSIS — S62102A Fracture of unspecified carpal bone, left wrist, initial encounter for closed fracture: Secondary | ICD-10-CM | POA: Diagnosis not present

## 2020-04-14 DIAGNOSIS — R279 Unspecified lack of coordination: Secondary | ICD-10-CM | POA: Diagnosis not present

## 2020-04-14 DIAGNOSIS — Z743 Need for continuous supervision: Secondary | ICD-10-CM | POA: Diagnosis not present

## 2020-04-14 DIAGNOSIS — F418 Other specified anxiety disorders: Secondary | ICD-10-CM | POA: Diagnosis not present

## 2020-04-14 DIAGNOSIS — S32001D Stable burst fracture of unspecified lumbar vertebra, subsequent encounter for fracture with routine healing: Secondary | ICD-10-CM | POA: Diagnosis not present

## 2020-04-14 DIAGNOSIS — F0280 Dementia in other diseases classified elsewhere without behavioral disturbance: Secondary | ICD-10-CM | POA: Diagnosis not present

## 2020-04-14 DIAGNOSIS — W19XXXA Unspecified fall, initial encounter: Secondary | ICD-10-CM | POA: Diagnosis not present

## 2020-04-14 LAB — BASIC METABOLIC PANEL
Anion gap: 14 (ref 5–15)
BUN: 17 mg/dL (ref 8–23)
CO2: 25 mmol/L (ref 22–32)
Calcium: 8.3 mg/dL — ABNORMAL LOW (ref 8.9–10.3)
Chloride: 103 mmol/L (ref 98–111)
Creatinine, Ser: 0.61 mg/dL (ref 0.44–1.00)
GFR, Estimated: >60 mL/min (ref >60)
Glucose, Bld: 101 mg/dL — ABNORMAL HIGH (ref 70–99)
Potassium: 3.4 mmol/L — ABNORMAL LOW (ref 3.5–5.1)
Sodium: 142 mmol/L (ref 135–145)

## 2020-04-14 LAB — CBC
HCT: 30.9 % — ABNORMAL LOW (ref 36.0–46.0)
Hemoglobin: 9.4 g/dL — ABNORMAL LOW (ref 12.0–15.0)
MCH: 26.1 pg (ref 26.0–34.0)
MCHC: 30.4 g/dL (ref 30.0–36.0)
MCV: 85.8 fL (ref 80.0–100.0)
Platelets: 317 10*3/uL (ref 150–400)
RBC: 3.6 MIL/uL — ABNORMAL LOW (ref 3.87–5.11)
RDW: 15.7 % — ABNORMAL HIGH (ref 11.5–15.5)
WBC: 11 10*3/uL — ABNORMAL HIGH (ref 4.0–10.5)
nRBC: 0 % (ref 0.0–0.2)

## 2020-04-14 LAB — RESP PANEL BY RT-PCR (FLU A&B, COVID) ARPGX2
Influenza A by PCR: NEGATIVE
Influenza B by PCR: NEGATIVE
SARS Coronavirus 2 by RT PCR: NEGATIVE

## 2020-04-14 LAB — GLUCOSE, CAPILLARY
Glucose-Capillary: 109 mg/dL — ABNORMAL HIGH (ref 70–99)
Glucose-Capillary: 109 mg/dL — ABNORMAL HIGH (ref 70–99)
Glucose-Capillary: 116 mg/dL — ABNORMAL HIGH (ref 70–99)
Glucose-Capillary: 127 mg/dL — ABNORMAL HIGH (ref 70–99)

## 2020-04-14 MED ORDER — DOCUSATE SODIUM 100 MG PO CAPS: 100.0000 mg | ORAL_CAPSULE | Freq: Two times a day (BID) | ORAL | 0 refills | Status: DC

## 2020-04-14 MED ORDER — POTASSIUM CHLORIDE CRYS ER 20 MEQ PO TBCR
20.0000 meq | EXTENDED_RELEASE_TABLET | Freq: Once | ORAL | Status: AC
  Administered 2020-04-14: 20 meq via ORAL
  Filled 2020-04-14: qty 1

## 2020-04-14 MED ORDER — ENOXAPARIN SODIUM 40 MG/0.4ML ~~LOC~~ SOLN: 40.0000 mg | Freq: Every day | SUBCUTANEOUS | 0 refills | Status: DC

## 2020-04-14 MED ORDER — METHOCARBAMOL 500 MG PO TABS: 500.0000 mg | ORAL_TABLET | Freq: Three times a day (TID) | ORAL | 0 refills | Status: DC | PRN

## 2020-04-14 MED ORDER — OXYCODONE HCL 5 MG PO TABS: 5.0000 mg | ORAL_TABLET | Freq: Four times a day (QID) | ORAL | 0 refills | Status: DC | PRN

## 2020-04-14 MED ORDER — TAMSULOSIN HCL 0.4 MG PO CAPS
0.4000 mg | ORAL_CAPSULE | Freq: Every day | ORAL | Status: AC
Start: 2020-04-14 — End: 2020-04-19

## 2020-04-14 MED ORDER — POLYETHYLENE GLYCOL 3350 17 G PO PACK: 17.0000 g | PACK | Freq: Every day | ORAL | 0 refills | Status: AC | PRN

## 2020-04-14 NOTE — Progress Notes (Signed)
Physical Therapy Treatment Patient Details Name: Lisa Castillo MRN: [id number] DOB: 01-17-1929 Today's Date: 04/14/2020    History of Present Illness 87 y.o. female with medical history significant for HTN, hypothyroidism, dementia who presents by EMS after an unwitnessed fall at facility. Patient sustained L femur fx and L distal radius fx. Patient s/p IM nail L femur and closed reduction of L distal radius on 2/9.    PT Comments    Pt supine in bed.  Performed co treat to progress to OOB this session.  Pt tolerated session well but reamins fearful of falling during mobility progression.  Pt sucessfully completed BM on commode.  Continue to recommend return back to snf at [facility].     Follow Up Recommendations  SNF (return to [facility])     Equipment Recommendations  None recommended by PT    Recommendations for Other Services       Precautions / Restrictions Precautions Precautions: Fall Required Braces or Orthoses: Splint/Cast Splint/Cast: L forearm/elbow Restrictions Weight Bearing Restrictions: Yes LUE Weight Bearing: Non weight bearing LLE Weight Bearing: Weight bearing as tolerated Other Position/Activity Restrictions: per MD, completely NWB on L UE    Mobility  Bed Mobility Overal bed mobility: Needs Assistance Bed Mobility: Supine to Sit;Sit to Supine     Supine to sit: Total assist;+2 for physical assistance (due to fear resisting to edge of bed.)     General bed mobility comments: Pt supine in bed required assistance to move to edge of bed and elevate trunk into a seated position.  Presents with posterior lean and required cues to shift weight forward.  Once scooted to edge of bed able to maintain sitting unsupported.    Transfers Overall transfer level: Needs assistance Equipment used: 1 person hand held assist Transfers: Sit to/from [employer] Transfers Sit to Stand: Mod assist;+2 physical assistance;+2  safety/equipment Stand pivot transfers: Mod assist;+2 physical assistance (from bed to commode.) Squat pivot transfers: Max assist;+2 safety/equipment (from commode to recliner.)     General transfer comment: Pt performed transfer from bed and commode.  Assistance to rise into standing and pivot from surface to surface.  Present with flexed posture than was more apparent after toileting.  Pivot performed from surface to surface.  Ambulation/Gait                 Stairs             Wheelchair Mobility    Modified Rankin (Stroke Patients Only)       Balance Overall balance assessment: Needs assistance Sitting-balance support: Single extremity supported;Feet supported Sitting balance-Leahy Scale: Fair       Standing balance-Leahy Scale: Poor Standing balance comment: posterior lean and reliant on UE support and external assist in standing                            Cognition Arousal/Alertness: Awake/alert Behavior During Therapy: WFL for tasks assessed/performed Overall Cognitive Status: History of cognitive impairments - at baseline                                 General Comments: Dtr states that pt is at baseline cognition      Exercises General Exercises - Lower Extremity Ankle Circles/Pumps: AROM;Both;10 reps;Supine Quad Sets: AROM;Left;10 reps;Supine Heel Slides: AAROM;Left;10 reps;Supine Hip ABduction/ADduction: AAROM;Left;10 reps;Supine    General Comments  Pertinent Vitals/Pain Pain Assessment: Faces Faces Pain Scale: Hurts whole lot Pain Location: L LE during mobility to sit EOB and standing Pain Descriptors / Indicators: Grimacing;Guarding;Moaning Pain Intervention(s): Monitored during session;Repositioned    Home Living                      Prior Function            PT Goals (current goals can now be found in the care plan section) Acute Rehab PT Goals Patient Stated Goal: go to rehab, take  care of self again Potential to Achieve Goals: Good Progress towards PT goals: Progressing toward goals    Frequency    Min 3X/week      PT Plan Current plan remains appropriate    Co-evaluation PT/OT/SLP Co-Evaluation/Treatment: Yes Reason for Co-Treatment: Complexity of the patient's impairments (multi-system involvement) PT goals addressed during session: Mobility/safety with mobility        AM-PAC PT "6 Clicks" Mobility   Outcome Measure  Help needed turning from your back to your side while in a flat bed without using bedrails?: Total Help needed moving from lying on your back to sitting on the side of a flat bed without using bedrails?: Total Help needed moving to and from a bed to a chair (including a wheelchair)?: A Lot Help needed standing up from a chair using your arms (e.g., wheelchair or bedside chair)?: A Lot Help needed to walk in hospital room?: Total Help needed climbing 3-5 steps with a railing? : Total 6 Click Score: 8    End of Session Equipment Utilized During Treatment: Gait belt Activity Tolerance: Patient limited by pain Patient left: with call bell/phone within reach;with bed alarm set;in chair Nurse Communication: Mobility status PT Visit Diagnosis: Unsteadiness on feet (R26.81);Muscle weakness (generalized) (M62.81);History of falling (Z91.81);Difficulty in walking, not elsewhere classified (R26.2)     Time: 4383-8184 PT Time Calculation (min) (ACUTE ONLY): 33 min  Charges:  $Therapeutic Activity: 8-22 mins                     Lisa Castillo , PTA Acute Rehabilitation Services Pager [phone] Office [phone]     Lisa Castillo 04/14/2020, 11:30 AM

## 2020-04-14 NOTE — TOC Transition Note (Signed)
Transition of Care [facility]) - CM/SW Discharge Note   Patient Details  Name: Kymberlie Brazeau MRN: [id number] Date of Birth: 1928/09/07  [facility] CM/SW Contact:  Coralee Pesa, [place] Phone Number: 04/14/2020, 1:06 PM   Clinical Narrative:    Nurse to call report to [phone] Ask for nursing office ext. [phone] Rm# [phone]   Final next level of care: Skilled Nursing Facility Barriers to Discharge: Barriers Resolved   Patient Goals and CMS Choice   CMS Medicare.gov Compare Post Acute Care list provided to:: Patient Represenative (must comment) Richardo Hanks, daughter)    Discharge Placement              Patient chooses bed at: [facility] Patient to be transferred to facility by: [facility] Name of family member notified: Joycelyn Schmid Patient and family notified of of transfer: 04/14/20  Discharge Plan and Services                                     Social Determinants of Health (SDOH) Interventions     Readmission Risk Interventions No flowsheet data found.

## 2020-04-14 NOTE — Progress Notes (Signed)
Report called to [place], Therapist, sports at [facility].

## 2020-04-14 NOTE — Progress Notes (Signed)
Occupational Therapy Treatment Patient Details Name: Roiza Wiedel MRN: [id number] DOB: 10-Jul-1928 Today's Date: 04/14/2020    History of present illness 87 y.o. female with medical history significant for HTN, hypothyroidism, dementia who presents by EMS after an unwitnessed fall at facility. Patient sustained L femur fx and L distal radius fx. Patient s/p IM nail L femur and closed reduction of L distal radius on 2/9.   OT comments  Pt making progress with functional goals. Pt requires mod encouragement to initiate tasks; pt anxious and fearful due to anticipatory pain during mobility. OT will continue to follow acutely to maximize level of function and safety  Follow Up Recommendations  SNF;Supervision/Assistance - 24 hour    Equipment Recommendations  Other (comment);Wheelchair (measurements OT);Wheelchair cushion (measurements OT) (TBD at SNF)    Recommendations for Other Services      Precautions / Restrictions Precautions Precautions: Fall Required Braces or Orthoses: Splint/Cast Splint/Cast: L forearm/elbow Restrictions Weight Bearing Restrictions: Yes LUE Weight Bearing: Non weight bearing LLE Weight Bearing: Weight bearing as tolerated Other Position/Activity Restrictions: per MD, completely NWB on L UE       Mobility Bed Mobility Overal bed mobility: Needs Assistance Bed Mobility: Supine to Sit     Supine to sit: Total assist;+2 for physical assistance     General bed mobility comments: Pt supine in bed required assistance to move to edge of bed and elevate trunk into a seated position.  Presents with posterior lean and required cues to shift weight forward.  Once scooted to edge of bed able to maintain sitting unsupported.  Transfers Overall transfer level: Needs assistance Equipment used: 1 person hand held assist Transfers: Sit to/from [employer] Transfers Sit to Stand: Mod assist;+2 physical assistance;+2  safety/equipment Stand pivot transfers: Mod assist;+2 physical assistance Squat pivot transfers: Max assist;+2 safety/equipment     General transfer comment: Pt performed transfer from bed and commode.  Assistance to rise into standing and pivot from surface to surface.  Present with flexed posture than was more apparent after toileting.  Pivot performed from surface to surface.    Balance Overall balance assessment: Needs assistance Sitting-balance support: Single extremity supported;Feet supported Sitting balance-Leahy Scale: Fair Sitting balance - Comments: min guard for safety sitting EOB   Standing balance support: Bilateral upper extremity supported;During functional activity Standing balance-Leahy Scale: Poor Standing balance comment: posterior lean and reliant on UE support and external assist in standing                           ADL either performed or assessed with clinical judgement   ADL Overall ADL's : Needs assistance/impaired Eating/Feeding: Set up;Supervision/ safety;Sitting   Grooming: Wash/dry hands;Wash/dry face;Min Systems developer: +2 for physical assistance;Moderate assistance;Cueing for safety;Cueing for sequencing;Stand-pivot;BSC   Toileting- Clothing Manipulation and Hygiene: Total assistance;Sit to/from stand       Functional mobility during ADLs: Moderate assistance;+2 for physical assistance;Cueing for safety;Cueing for sequencing General ADL Comments: Pt transferred ti [facility] and had BM. Pt required total A for posterior peri hygiene while standing at [facility] with mod - max A     Vision Baseline Vision/History: Wears glasses Patient Visual Report: No change from baseline     Perception     Praxis      Cognition Arousal/Alertness: Awake/alert Behavior During Therapy: WFL for tasks assessed/performed Overall Cognitive  Status: History of cognitive impairments - at baseline                                  General Comments: Dtr states that pt is at baseline cognition        Exercises General Exercises - Lower Extremity Ankle Circles/Pumps: AROM;Both;10 reps;Supine Quad Sets: AROM;Left;10 reps;Supine Heel Slides: AAROM;Left;10 reps;Supine Hip ABduction/ADduction: AAROM;Left;10 reps;Supine   Shoulder Instructions       General Comments      Pertinent Vitals/ Pain       Pain Assessment: Faces Faces Pain Scale: Hurts whole lot Pain Location: L LE during mobility to sit EOB and standing Pain Descriptors / Indicators: Grimacing;Guarding;Moaning Pain Intervention(s): Limited activity within patient's tolerance;Monitored during session;Repositioned;RN gave pain meds during session  Home Living                                          Prior Functioning/Environment              Frequency  Min 2X/week        Progress Toward Goals  OT Goals(current goals can now be found in the care plan section)  Progress towards OT goals: Progressing toward goals  Acute Rehab OT Goals Patient Stated Goal: go to rehab, take care of self again  Plan Discharge plan remains appropriate    Co-evaluation    PT/OT/SLP Co-Evaluation/Treatment: Yes Reason for Co-Treatment: Complexity of the patient's impairments (multi-system involvement);For patient/therapist safety;To address functional/ADL transfers PT goals addressed during session: Mobility/safety with mobility OT goals addressed during session: ADL's and self-care;Proper use of [employer] equipment and DME      AM-PAC OT "6 Clicks" Daily Activity     Outcome Measure   Help from another person eating meals?: None Help from another person taking care of personal grooming?: A Little Help from another person toileting, which includes using toliet, bedpan, or urinal?: Total Help from another person bathing (including washing, rinsing, drying)?: Total Help from another person to put on and taking off  regular upper body clothing?: A Lot Help from another person to put on and taking off regular lower body clothing?: Total 6 Click Score: 12    End of Session Equipment Utilized During Treatment: Gait belt;Rolling walker;Other (comment) (BSC)  OT Visit Diagnosis: Unsteadiness on feet (R26.81);Muscle weakness (generalized) (M62.81);History of falling (Z91.81);Other abnormalities of gait and mobility (R26.89);Other symptoms and signs involving cognitive function;Pain Pain - Right/Left: Left Pain - part of body: Leg;Arm   Activity Tolerance Patient limited by pain   Patient Left with call bell/phone within reach;with family/visitor present;with chair alarm set;in chair   Nurse Communication Mobility status        Time: 2542-7062 OT Time Calculation (min): 39 min  Charges: OT General Charges $OT Visit: 1 Visit OT Treatments $Self Care/Home Management : 8-22 mins $Therapeutic Activity: 8-22 mins    Britt Bottom 04/14/2020, 1:53 PM

## 2020-04-14 NOTE — Discharge Summary (Signed)
Physician Discharge Summary   Patient ID: Lisa Castillo MRN: [id number] DOB/AGE: 1928-11-21 87 y.o.  Admit date: 04/07/2020 Discharge date: 04/14/2020  Primary Care Physician:  Lisa Dad, MD   Recommendations for Outpatient Follow-up:  1. Follow up with PCP in 1-2 weeks 2. Continue Foley catheter, was placed on 04/13/19 for acute urinary retention, voiding trial on Wednesday, 04/17/19 at the skilled nursing facility 3. Needs appointment with orthopedics, Dr. Erlinda Castillo in 2 weeks for follow-up 4. Orthopedics recommendations Up with therapy LUE- NWB.  Continue with sling.  Continue to elevate for pain and swelling LLE- WBAT.  Ice as needed for pain and swelling Lovenox for dvt ppx F/u with Dr. Erlinda Castillo 2 weeks post-op   Home Health: Patient returning to skilled nursing facility Equipment/Devices:   Discharge Condition: stable CODE STATUS:  DNR   Diet recommendation: Carb modified   Discharge Diagnoses:   . Closed left hip fracture ([place]) status post ORIF on 2/9 . Fracture of the lower end of left radius status post closed reduction  ([place]) . Acute blood loss anemia on chronic anemia ([facility]) . Acute urinary retention ([place])  . Constipation ([place]) . HTN (hypertension) . Diabetes mellitus type 2, NIDDM, controlled ([place]) . Hypothyroidism ([place]) . Mechanical fall ([place]) . Dementia with sundowning [facility])  Consults: Orthopedics, Dr. Erlinda Castillo    Allergies:   Allergies  Allergen Reactions  . Metformin And Related Diarrhea     DISCHARGE MEDICATIONS: Allergies as of 04/14/2020      Reactions   Metformin And Related Diarrhea      Medication List    STOP taking these medications   divalproex 125 MG capsule Commonly known as: DEPAKOTE SPRINKLE   traMADol 50 MG tablet Commonly known as: ULTRAM     TAKE these medications   acetaminophen 325 MG tablet Commonly known as: TYLENOL Take 650 mg by mouth in the morning, at noon, and at bedtime.   atorvastatin 10 MG tablet Commonly known as:  LIPITOR Take 10 mg by mouth daily.   Blood Glucose Monitoring Suppl Kit Use to test blood sugar once daily daily. Dx: E11.69   Boost Glucose Control Liqd Take by mouth. 1 can three times a day between meals   docusate sodium 100 MG capsule Commonly known as: COLACE Take 1 capsule (100 mg total) by mouth 2 (two) times daily.   enoxaparin 40 MG/0.4ML injection Commonly known as: LOVENOX Inject 0.4 mLs (40 mg total) into the skin daily for 14 days.   glimepiride 2 MG tablet Commonly known as: AMARYL Take 2 mg by mouth daily with breakfast.   glucose blood test strip Use to test blood sugar once daily. Dx: E11.69   Lancets 30G Misc Use to test blood sugar once daily. Dx: E11.69   Lancets Misc. Kit 1 Device by Does not apply route daily. Use as directed   levothyroxine 75 MCG tablet Commonly known as: SYNTHROID TAKE 1 TABLET (75 MCG TOTAL) BY MOUTH DAILY BEFORE BREAKFAST.   losartan 25 MG tablet Commonly known as: COZAAR Take 25 mg by mouth daily.   methocarbamol 500 MG tablet Commonly known as: ROBAXIN Take 1 tablet (500 mg total) by mouth every 8 (eight) hours as needed for muscle spasms.   metoprolol tartrate 25 MG tablet Commonly known as: LOPRESSOR Take 12.5 mg by mouth 2 (two) times daily.   oxyCODONE 5 MG immediate release tablet Commonly known as: Oxy IR/ROXICODONE Take 1 tablet (5 mg total) by mouth every 6 (six) hours as needed  for moderate pain or severe pain.   polyethylene glycol 17 g packet Commonly known as: MIRALAX / GLYCOLAX Take 17 g by mouth daily as needed for moderate constipation.   QUEtiapine 25 MG tablet Commonly known as: SEROQUEL Take 12.5 mg by mouth at bedtime.   sertraline 25 MG tablet Commonly known as: ZOLOFT Take 50 mg by mouth daily.   tamsulosin 0.4 MG Caps capsule Commonly known as: FLOMAX Take 1 capsule (0.4 mg total) by mouth daily for 5 days.   Vitamin D3 50 MCG (2000 UT) Tabs Take 1 tablet by mouth daily.   zinc  oxide 20 % ointment Apply 1 application topically as needed for irritation.            Discharge Care Instructions  (From admission, onward)         Start     Ordered   04/14/20 0000  If the dressing is still on your incision site when you go home, remove it on the third day after your surgery date. Remove dressing if it begins to fall off, or if it is dirty or damaged before the third day.        04/14/20 0940   04/09/20 0000  Weight bearing as tolerated        04/09/20 1649           Brief H and P: For complete details please refer to admission H and P, but in brief [name] a 87 year old female with history of HTN, hypothyroidism, dementia who presents by EMS after a fall at facility.  The patient is apparently in a wheelchair from a previous spinal surgery but is able to ambulate at times.  Patient has an alarm on her wheelchair and somehow she went outside and attempted to ambulate.  Patient stepped off of a curb and fell onto her left side.  She did hit her head, no report of loss of consciousness. Patient was found to have left distal radius fracture and left hip fracture on the x-rays.  Head CT and neck CT were negative for any acute fracture Orthopedics consulted. Left wrist fracture was splinted in ED  Hospital Course:   Closed left hip fracture, initial encounter [facility]) -Orthopedics was consulted, underwent ORIF of intertrochanteric fracture on 2/9, postop day # 5 -Status post Foley placement on 2/12 for urinary retention -Hb 7.5 on 2/12, received packed RBC transfusion.  Hb 9.1 after transfusion -Hemoglobin 9.4 at the time of discharge. Continue pain control, Lovenox for DVT prophylaxis Orthopedics recommendations as above, outpatient follow-up with Dr. Erlinda Castillo in 2 weeks     Unspecified fracture of the lower end of left radius -  Underwent closed reduction of the left distal fracture with splint, postop day #5 - currently in sling     Dementia with  sundowning -Resumed Seroquel 12.5 mg at bedtime.  No acute complaints.  - Will hold off on Depakote  Acute blood loss anemia on chronic anemia -H&H is now stable, hemoglobin 9.4  Acute urinary retention -Patient developed urinary retention on 2/10 evening, placed on Flomax for total 7 days, straight cath x3, placed Foley cath on 2/12 -Voiding trial on 04/16/2020 at the skilled nursing facility.  Constipation Resolved with bowel regimen    HTN (hypertension) -BP currently stable, continue losartan, metoprolol     Diabetes mellitus type 2 in nonobese (HCC), NIDDM -Resume Amaryl - Hemoglobin A1c 5.5  Mechanical fall as cause of accidental injury in residential institution as place of occurrence   -  PT OT evaluation recommended SNF  Hypothyroidism -Continue Synthroid  Right shoulder pain -X-ray showed early glenohumeral osteoarthritis, otherwise no acute osseous abnormality -Continue pain control, ice pack as needed    Day of Discharge S: No acute complaints, no fevers or chills. Alert and oriented, daughter at the bedside.  BP (!) 153/66 (BP Location: Right Leg)   Pulse 91   Temp 98.6 F (37 C) (Oral)   Resp 18   Ht 5' (1.524 m)   Wt 63.5 kg   SpO2 95%   BMI 27.34 kg/m   Physical Exam: General: Alert and awake oriented, NAD HEENT: anicteric sclera, pupils reactive to light and accommodation CVS: S1-S2 clear no murmur rubs or gallops Chest: clear to auscultation bilaterally, no wheezing rales or rhonchi Abdomen: soft nontender, nondistended, normal bowel sounds Extremities: left arm in sling Neuro: no new FND's GU: foley +   Get Medicines reviewed and adjusted: Please take all your medications with you for your next visit with your Primary MD  Please request your Primary MD to go over all hospital tests and procedure/radiological results at the follow up. Please ask your Primary MD to get all Hospital records sent to his/her office.  If you experience  worsening of your admission symptoms, develop shortness of breath, life threatening emergency, suicidal or homicidal thoughts you must seek medical attention immediately by calling 911 or calling your MD immediately  if symptoms less severe.  You must read complete instructions/literature along with all the possible adverse reactions/side effects for all the Medicines you take and that have been prescribed to you. Take any new Medicines after you have completely understood and accept all the possible adverse reactions/side effects.   Do not drive when taking pain medications.   Do not take more than prescribed Pain, Sleep and Anxiety Medications  Special Instructions: If you have smoked or chewed Tobacco  in the last 2 yrs please stop smoking, stop any regular Alcohol  and or any Recreational drug use.  Wear Seat belts while driving.  Please note  You were cared for by a hospitalist during your hospital stay. Once you are discharged, your primary care physician will handle any further medical issues. Please note that NO REFILLS for any discharge medications will be authorized once you are discharged, as it is imperative that you return to your primary care physician (or establish a relationship with a primary care physician if you do not have one) for your aftercare needs so that they can reassess your need for medications and monitor your lab values.   The results of significant diagnostics from this hospitalization (including imaging, microbiology, ancillary and laboratory) are listed below for reference.      Procedures/Studies:  DG Chest 1 View  Result Date: 04/07/2020 CLINICAL DATA:  Found down, dementia EXAM: CHEST  1 VIEW COMPARISON:  None. FINDINGS: Single frontal view of the chest demonstrates an unremarkable cardiac silhouette. No airspace disease, effusion, or pneumothorax. No acute bony abnormalities. IMPRESSION: 1. No acute intrathoracic process. Electronically Signed   By:  Randa Ngo M.D.   On: 04/07/2020 20:24   DG Wrist Complete Left  Result Date: 04/09/2020 CLINICAL DATA:  Close reduction left wrist fracture EXAM: LEFT WRIST - COMPLETE 3+ VIEW COMPARISON:  04/07/2020 FINDINGS: Two fluoroscopic images are obtained during the performance of the procedure and are provided for interpretation only. There has been interval reduction of the comminuted distal left radial fracture seen previously, now with near anatomic alignment. Please refer to the  operative report. FLUOROSCOPY TIME:  8 seconds IMPRESSION: 1. Reduction of distal left radial fracture with near anatomic alignment. Electronically Signed   By: Randa Ngo M.D.   On: 04/09/2020 18:07   DG Wrist Complete Left  Result Date: 04/07/2020 CLINICAL DATA:  Post reduction film of known left wrist fracture. EXAM: LEFT WRIST - COMPLETE 3+ VIEW COMPARISON:  None. FINDINGS: Splinting material obscures fine bony and soft tissue detail. Interval splinting with similar alignment of the displaced, impacted dorsally angulated fracture through the distal radius. IMPRESSION: Interval splinting with similar alignment of the distal radial fracture. Electronically Signed   By: Dahlia Bailiff MD   On: 04/07/2020 23:11   DG Wrist Complete Left  Result Date: 04/07/2020 CLINICAL DATA:  Pain EXAM: LEFT WRIST - COMPLETE 3+ VIEW COMPARISON:  None. FINDINGS: There is an acute, displaced and impacted fracture through the distal radius with significant dorsal angulation. There is surrounding soft tissue swelling. No frank dislocation. There is an old healed fracture of the fourth metacarpal. There are degenerative changes of the partially visualized interphalangeal joints. IMPRESSION: Acute, displaced, impacted fracture through the distal radius with significant dorsal angulation. Electronically Signed   By: Constance Holster M.D.   On: 04/07/2020 20:24   DG Ankle Complete Left  Result Date: 04/07/2020 CLINICAL DATA:  Pain EXAM: LEFT ANKLE  COMPLETE - 3+ VIEW COMPARISON:  None. FINDINGS: There is no evidence of fracture, dislocation, or joint effusion. There is no evidence of arthropathy or other focal bone abnormality. Soft tissues are unremarkable. IMPRESSION: Negative. Electronically Signed   By: Constance Holster M.D.   On: 04/07/2020 20:25   CT HEAD WO CONTRAST  Result Date: 04/07/2020 CLINICAL DATA:  Facial trauma EXAM: CT HEAD WITHOUT CONTRAST TECHNIQUE: Contiguous axial images were obtained from the base of the skull through the vertex without intravenous contrast. COMPARISON:  None. FINDINGS: Brain: No evidence of acute territorial infarction, hemorrhage, hydrocephalus,extra-axial collection or mass lesion/mass effect. There is dilatation the ventricles and sulci consistent with age-related atrophy. Low-attenuation changes in the deep white matter consistent with small vessel ischemia. Vascular: No hyperdense vessel or unexpected calcification. Skull: The skull is intact. No fracture or focal lesion identified. Mild soft tissue swelling seen over the left frontal skull. Sinuses/Orbits: The visualized paranasal sinuses and mastoid air cells are clear. The orbits and globes intact. Other: None Cervical spine: Alignment: Physiologic Skull base and vertebrae: Visualized skull base is intact. No atlanto-occipital dissociation. The vertebral body heights are well maintained. No fracture or pathologic osseous lesion seen. Soft tissues and spinal canal: The visualized paraspinal soft tissues are unremarkable. No prevertebral soft tissue swelling is seen. The spinal canal is grossly unremarkable, no large epidural collection or significant canal narrowing. Disc levels: Multilevel cervical spine spondylosis seen with disc osteophyte complex and uncovertebral osteophytes most notable at C4-C5 and C5-C6. Upper chest: The lung apices are clear. Thoracic inlet is within normal limits. Other: None IMPRESSION: No acute intracranial abnormality. Findings  consistent with age related atrophy and chronic small vessel ischemia No acute fracture or malalignment of the spine. Electronically Signed   By: Prudencio Pair M.D.   On: 04/07/2020 20:43   CT CERVICAL SPINE WO CONTRAST  Result Date: 04/07/2020 CLINICAL DATA:  Facial trauma EXAM: CT HEAD WITHOUT CONTRAST TECHNIQUE: Contiguous axial images were obtained from the base of the skull through the vertex without intravenous contrast. COMPARISON:  None. FINDINGS: Brain: No evidence of acute territorial infarction, hemorrhage, hydrocephalus,extra-axial collection or mass lesion/mass effect. There is  dilatation the ventricles and sulci consistent with age-related atrophy. Low-attenuation changes in the deep white matter consistent with small vessel ischemia. Vascular: No hyperdense vessel or unexpected calcification. Skull: The skull is intact. No fracture or focal lesion identified. Mild soft tissue swelling seen over the left frontal skull. Sinuses/Orbits: The visualized paranasal sinuses and mastoid air cells are clear. The orbits and globes intact. Other: None Cervical spine: Alignment: Physiologic Skull base and vertebrae: Visualized skull base is intact. No atlanto-occipital dissociation. The vertebral body heights are well maintained. No fracture or pathologic osseous lesion seen. Soft tissues and spinal canal: The visualized paraspinal soft tissues are unremarkable. No prevertebral soft tissue swelling is seen. The spinal canal is grossly unremarkable, no large epidural collection or significant canal narrowing. Disc levels: Multilevel cervical spine spondylosis seen with disc osteophyte complex and uncovertebral osteophytes most notable at C4-C5 and C5-C6. Upper chest: The lung apices are clear. Thoracic inlet is within normal limits. Other: None IMPRESSION: No acute intracranial abnormality. Findings consistent with age related atrophy and chronic small vessel ischemia No acute fracture or malalignment of the  spine. Electronically Signed   By: Prudencio Pair M.D.   On: 04/07/2020 20:43   DG Shoulder Right Port  Result Date: 04/13/2020 CLINICAL DATA:  Right shoulder pain. EXAM: PORTABLE RIGHT SHOULDER COMPARISON:  None. FINDINGS: No acute fracture or dislocation. Small well corticated ossific density near the superior glenoid, potentially related to old trauma versus small intra-articular body. Old healed fracture of the right mid clavicle. Small humeral head marginal osteophytes. Joint spaces are preserved. Soft tissues are unremarkable. IMPRESSION: 1. Early glenohumeral osteoarthritis. No acute osseous abnormality. Electronically Signed   By: Titus Dubin M.D.   On: 04/13/2020 12:01   DG C-Arm 1-60 Min  Result Date: 04/09/2020 CLINICAL DATA:  ORIF left femur. EXAM: DG C-ARM 1-60 MIN; LEFT FEMUR 2 VIEWS FLUOROSCOPY TIME:  Fluoroscopy Time:  1 minutes 41 seconds Reported radiation: 17 mGy. COMPARISON:  April 07, 2020 FINDINGS: Four C-arm fluoroscopic images were obtained intraoperatively and submitted for post operative interpretation. These images demonstrate fixation of an inter trochanteric fracture of the left femur with intramedullary nail and screws. Improved, near anatomic alignment. No unexpected findings. Please see the performing provider's procedural report for further detail. IMPRESSION: Intraoperative fluoroscopic imaging, as detailed above. Electronically Signed   By: Margaretha Sheffield MD   On: 04/09/2020 17:10   DG Hip Unilat With Pelvis 2-3 Views Left  Result Date: 04/07/2020 CLINICAL DATA:  Pain EXAM: DG HIP (WITH OR WITHOUT PELVIS) 2-3V LEFT COMPARISON:  None. FINDINGS: There is an acute, comminuted and displaced intratrochanteric fracture of the proximal left femur. There is no dislocation. There is osteopenia. There are moderate degenerative changes of both hips. IMPRESSION: Acute, comminuted and displaced intratrochanteric fracture of the proximal left femur. Electronically Signed   By:  Constance Holster M.D.   On: 04/07/2020 20:24   DG FEMUR MIN 2 VIEWS LEFT  Result Date: 04/09/2020 CLINICAL DATA:  ORIF left femur. EXAM: DG C-ARM 1-60 MIN; LEFT FEMUR 2 VIEWS FLUOROSCOPY TIME:  Fluoroscopy Time:  1 minutes 41 seconds Reported radiation: 17 mGy. COMPARISON:  April 07, 2020 FINDINGS: Four C-arm fluoroscopic images were obtained intraoperatively and submitted for post operative interpretation. These images demonstrate fixation of an inter trochanteric fracture of the left femur with intramedullary nail and screws. Improved, near anatomic alignment. No unexpected findings. Please see the performing provider's procedural report for further detail. IMPRESSION: Intraoperative fluoroscopic imaging, as detailed above. Electronically Signed  By: Margaretha Sheffield MD   On: 04/09/2020 17:10       LAB RESULTS: Basic Metabolic Panel: Recent Labs  Lab 04/11/20 0136 04/14/20 0354  NA 141 142  K 3.5 3.4*  CL 108 103  CO2 23 25  GLUCOSE 106* 101*  BUN 16 17  CREATININE 0.57 0.61  CALCIUM 8.0* 8.3*   Liver Function Tests: No results for input(s): AST, ALT, ALKPHOS, BILITOT, PROT, ALBUMIN in the last 168 hours. No results for input(s): LIPASE, AMYLASE in the last 168 hours. No results for input(s): AMMONIA in the last 168 hours. CBC: Recent Labs  Lab 04/07/20 1921 04/08/20 0410 04/12/20 0200 04/12/20 1841 04/14/20 0354  WBC 14.1*   < > 9.0  --  11.0*  NEUTROABS 10.0*  --   --   --   --   HGB 11.3*   < > 7.5* 9.1* 9.4*  HCT 35.1*   < > 23.4* 27.0* 30.9*  MCV 82.2   < > 83.9  --  85.8  PLT 341   < > 270  --  317   < > = values in this interval not displayed.   Cardiac Enzymes: No results for input(s): CKTOTAL, CKMB, CKMBINDEX, TROPONINI in the last 168 hours. BNP: Invalid input(s): POCBNP CBG: Recent Labs  Lab 04/14/20 0006 04/14/20 0604  GLUCAP 109* 127*       Disposition and Follow-up: Discharge Instructions    Diet Carb Modified   Complete by: As  directed    If the dressing is still on your incision site when you go home, remove it on the third day after your surgery date. Remove dressing if it begins to fall off, or if it is dirty or damaged before the third day.   Complete by: As directed    Increase activity slowly   Complete by: As directed    Weight bearing as tolerated   Complete by: As directed        DISPOSITION: [place]    Leandrew Koyanagi, MD In 2 weeks.   Specialty: Orthopedic Surgery Why: For suture removal, For wound re-check Contact information: [place] [phone]        Lisa Dad, MD Follow up in 2 week(s).   Specialty: Internal Medicine Contact information: [place] [phone]                Time coordinating discharge:  40 minutes  Signed:   Estill Cotta M.D. [facility] 04/14/2020, 10:12 AM

## 2020-04-14 NOTE — Plan of Care (Signed)

## 2020-04-15 ENCOUNTER — Encounter: Payer: Self-pay | Admitting: Nurse Practitioner

## 2020-04-15 ENCOUNTER — Non-Acute Institutional Stay (SKILLED_NURSING_FACILITY): Payer: Medicare Other | Admitting: Nurse Practitioner

## 2020-04-15 DIAGNOSIS — R339 Retention of urine, unspecified: Secondary | ICD-10-CM

## 2020-04-15 DIAGNOSIS — R296 Repeated falls: Secondary | ICD-10-CM | POA: Diagnosis not present

## 2020-04-15 DIAGNOSIS — E039 Hypothyroidism, unspecified: Secondary | ICD-10-CM | POA: Diagnosis not present

## 2020-04-15 DIAGNOSIS — F418 Other specified anxiety disorders: Secondary | ICD-10-CM | POA: Diagnosis not present

## 2020-04-15 DIAGNOSIS — S72002D Fracture of unspecified part of neck of left femur, subsequent encounter for closed fracture with routine healing: Secondary | ICD-10-CM

## 2020-04-15 DIAGNOSIS — E669 Obesity, unspecified: Secondary | ICD-10-CM

## 2020-04-15 DIAGNOSIS — E782 Mixed hyperlipidemia: Secondary | ICD-10-CM

## 2020-04-15 DIAGNOSIS — I10 Essential (primary) hypertension: Secondary | ICD-10-CM

## 2020-04-15 DIAGNOSIS — K5901 Slow transit constipation: Secondary | ICD-10-CM | POA: Diagnosis not present

## 2020-04-15 DIAGNOSIS — S52502A Unspecified fracture of the lower end of left radius, initial encounter for closed fracture: Secondary | ICD-10-CM

## 2020-04-15 DIAGNOSIS — F05 Delirium due to known physiological condition: Secondary | ICD-10-CM

## 2020-04-15 DIAGNOSIS — R000 Tachycardia, unspecified: Secondary | ICD-10-CM

## 2020-04-15 DIAGNOSIS — F0391 Unspecified dementia with behavioral disturbance: Secondary | ICD-10-CM | POA: Diagnosis not present

## 2020-04-15 DIAGNOSIS — E1169 Type 2 diabetes mellitus with other specified complication: Secondary | ICD-10-CM | POA: Diagnosis not present

## 2020-04-15 DIAGNOSIS — D500 Iron deficiency anemia secondary to blood loss (chronic): Secondary | ICD-10-CM

## 2020-04-15 NOTE — Assessment & Plan Note (Signed)
Recent fall, resulted in left hip, left wrist fxs, no safety awareness, limited mobility are contributory.

## 2020-04-15 NOTE — Assessment & Plan Note (Signed)
sundowning

## 2020-04-15 NOTE — Assessment & Plan Note (Signed)
takes Glimepiride, Hgb a1c 5.5 04/07/20

## 2020-04-15 NOTE — Assessment & Plan Note (Signed)
takes MiraLax prn, Colace bid 

## 2020-04-15 NOTE — Assessment & Plan Note (Signed)
takes Levothyroxine, TSH 3.509 12/29/19 

## 2020-04-15 NOTE — Assessment & Plan Note (Signed)
hgb 9.2 04/14/20, needs f/u CBC, may Iron supplement.

## 2020-04-15 NOTE — Assessment & Plan Note (Signed)
takes Sertraline, Quetiapine

## 2020-04-15 NOTE — Progress Notes (Signed)
Location:    [facility] Room Number: 39 Place of Service:  SNF (31) Provider:  Marda Stalker, Lennie Odor NP  [name], MD  Patient Care Team: [name], MD as PCP - General (Internal Medicine)  Extended Emergency Contact Information Primary Emergency Contact: Harris,Margaret Address: [address]          [place],  [address] [name] of [place] Phone: [phone] Mobile Phone: [phone] Relation: Daughter  Code Status:  DNR Goals of care: Advanced Directive information Advanced Directives 04/07/2020  Does Patient Have a Medical Advance Directive? No;Yes  Type of Advance Directive Out of facility DNR (pink MOST or yellow form);[place];Living will  Does patient want to make changes to medical advance directive? No - Patient declined  Copy of [place] in Chart? No - copy available, Physician notified  Would patient like information on creating a medical advance directive? No - Patient declined  Pre-existing out of facility DNR order (yellow form or pink MOST form) [place] Most/Yellow Form available - Physician notified to receive inpatient order     Chief Complaint  Patient presents with  . Acute Visit    Medication review    HPI:  Pt is a 87 y.o. female seen today for an acute visit for review medications  Hospitalized 04/07/20-04/14/20 for closed left hip fracture, s/p ORIF 04/09/20,  f/u Ortho x 2weeks WBAT, Lovenox for DVT PPX, prn Oxycodone, Methocarbamol prn, Tylenol tid for pain  L arm/the lower end of left radius, s/p closed reduction,  sling, hard cast, MWB  Urinary retention, Foley, Flomax, voiding trial tomorrow   Anemia, Hgb 9.4 04/14/20  Constipation, takes MiraLax prn, Colace bid  T2DM, takes Glimepiride, Hgb a1c 5.5 04/07/20  Hyperlipidemia, takes Atorvastatin. LDL 51 12/13/19  HTN, takes Metoprolol, Losartan, Bun/creat 17/0.61 04/14/20  Hypothyroidism, takes Levothyroxine, TSH 3.509  12/29/19  Frequent falls  Dementia, sundowning.   Depression/anxiety, takes Sertraline, Quetiapine   Past Medical History:  Diagnosis Date  . Hearing loss   . History of fracture of clavicle   . Hypothyroidism   . Insomnia   . Tinnitus    Past Surgical History:  Procedure Laterality Date  . CHOLECYSTECTOMY  2007   Dr. Verdene Lennert  . CLAVICLE EXCISION  1986  . INTRAMEDULLARY (IM) NAIL INTERTROCHANTERIC Left 04/09/2020   Procedure: LEFT INTERTROCHANTERIC INTRAMEDULLARY (IM) NAIL;  Surgeon: Leandrew Koyanagi, MD;  Location: [facility];  Service: Orthopedics;  Laterality: Left;  . MOHS SURGERY     past 10 years chest & legs  . OPEN REDUCTION INTERNAL FIXATION (ORIF) DISTAL RADIAL FRACTURE Left 04/09/2020   Procedure: CLOSED REDUCTION LEFT DISTAL RADIUS FRACTURE;  Surgeon: Leandrew Koyanagi, MD;  Location: [facility];  Service: Orthopedics;  Laterality: Left;    Allergies  Allergen Reactions  . Metformin And Related Diarrhea    Allergies as of 04/15/2020      Reactions   Metformin And Related Diarrhea      Medication List       Accurate as of April 15, 2020 11:59 PM. If you have any questions, ask your nurse or doctor.        acetaminophen 325 MG tablet Commonly known as: TYLENOL Take 650 mg by mouth in the morning, at noon, and at bedtime.   atorvastatin 10 MG tablet Commonly known as: LIPITOR Take 10 mg by mouth daily.   Blood Glucose Monitoring Suppl Kit Use to test blood sugar once daily daily. Dx:  E11.69   Boost Glucose Control Liqd Take by mouth. 1 can three times a day between meals   docusate sodium 100 MG capsule Commonly known as: COLACE Take 1 capsule (100 mg total) by mouth 2 (two) times daily.   enoxaparin 40 MG/0.4ML injection Commonly known as: LOVENOX Inject 0.4 mLs (40 mg total) into the skin daily for 14 days.   glimepiride 2 MG tablet Commonly known as: AMARYL Take 2 mg by mouth daily with breakfast.   glucose blood test strip Use to test blood sugar once  daily. Dx: E11.69   Lancets 30G Misc Use to test blood sugar once daily. Dx: E11.69   Lancets Misc. Kit 1 Device by Does not apply route daily. Use as directed   levothyroxine 75 MCG tablet Commonly known as: SYNTHROID TAKE 1 TABLET (75 MCG TOTAL) BY MOUTH DAILY BEFORE BREAKFAST.   losartan 25 MG tablet Commonly known as: COZAAR Take 25 mg by mouth daily.   methocarbamol 500 MG tablet Commonly known as: ROBAXIN Take 1 tablet (500 mg total) by mouth every 8 (eight) hours as needed for muscle spasms.   metoprolol tartrate 25 MG tablet Commonly known as: LOPRESSOR Take 12.5 mg by mouth 2 (two) times daily.   oxyCODONE 5 MG immediate release tablet Commonly known as: Oxy IR/ROXICODONE Take 1 tablet (5 mg total) by mouth every 6 (six) hours as needed for moderate pain or severe pain.   polyethylene glycol 17 g packet Commonly known as: MIRALAX / GLYCOLAX Take 17 g by mouth daily as needed for moderate constipation.   QUEtiapine 25 MG tablet Commonly known as: SEROQUEL Take 12.5 mg by mouth at bedtime.   sertraline 25 MG tablet Commonly known as: ZOLOFT Take 50 mg by mouth daily.   tamsulosin 0.4 MG Caps capsule Commonly known as: FLOMAX Take 1 capsule (0.4 mg total) by mouth daily for 5 days.   Vitamin D3 50 MCG (2000 UT) Tabs Take 1 tablet by mouth daily.   zinc oxide 20 % ointment Apply 1 application topically as needed for irritation.       Review of Systems  Constitutional: Negative for activity change, appetite change and fever.  HENT: Positive for hearing loss. Negative for congestion and voice change.   Eyes: Negative for visual disturbance.  Respiratory: Negative for shortness of breath.   Cardiovascular: Positive for leg swelling.  Gastrointestinal: Negative for abdominal pain and constipation.  Genitourinary: Positive for difficulty urinating.       Foley  Musculoskeletal: Positive for arthralgias, back pain and gait problem.  Skin: Negative for  color change.  Neurological: Negative for speech difficulty, weakness, light-headedness and headaches.       Confused.   Psychiatric/Behavioral: Positive for behavioral problems and confusion. Negative for sleep disturbance. The patient is not nervous/anxious.     Immunization History  Administered Date(s) Administered  . DTaP 07/20/2013  . Influenza, High Dose Seasonal PF 12/07/2016  . Influenza,inj,Quad PF,6+ Mos 12/01/2017  . Influenza-Unspecified 11/20/2014, 12/12/2015, 11/20/2019  . Moderna Sars-Covid-2 Vaccination 03/05/2019, 04/02/2019  . PPD Test 08/06/2014  . Pneumococcal Conjugate-13 11/07/2013  . Pneumococcal Polysaccharide-23 03/22/2017  . Zoster 03/01/2006  . Zoster Recombinat (Shingrix) 06/07/2017, 08/26/2017   Pertinent  Health Maintenance Due  Topic Date Due  . OPHTHALMOLOGY EXAM  Never done  . FOOT EXAM  07/10/2020  . HEMOGLOBIN A1C  10/05/2020  . INFLUENZA VACCINE  Completed  . DEXA SCAN  Completed  . PNA vac Low Risk Adult  Completed  Fall Risk  12/19/2019 10/17/2019 08/22/2019 07/11/2019 06/13/2019  Falls in the past year? 0 0 0 0 0  Number falls in past yr: 0 0 0 0 0  Injury with Fall? - - 0 - -   Functional Status Survey:    Vitals:   04/15/20 1111  BP: (!) 160/80  Pulse: 95  Resp: (!) 24  Temp: (!) 97.4 F (36.3 C)  SpO2: 91%  Weight: 140 lb 6.4 oz (63.7 kg)  Height: 5' (1.524 m)   Body mass index is 27.42 kg/m. Physical Exam Vitals and nursing note reviewed.  Constitutional:      Appearance: Normal appearance.  HENT:     Head: Normocephalic and atraumatic.     Mouth/Throat:     Mouth: Mucous membranes are moist.  Eyes:     Extraocular Movements: Extraocular movements intact.     Conjunctiva/sclera: Conjunctivae normal.     Pupils: Pupils are equal, round, and reactive to light.  Cardiovascular:     Rate and Rhythm: Normal rate and regular rhythm.     Heart sounds: No murmur heard.   Pulmonary:     Effort: Pulmonary effort is  normal.     Breath sounds: No rales.  Abdominal:     General: Bowel sounds are normal.     Palpations: Abdomen is soft.     Tenderness: There is no abdominal tenderness.  Genitourinary:    Comments: Foley Musculoskeletal:        General: Tenderness present.     Cervical back: Normal range of motion and neck supple.     Right lower leg: No edema.     Left lower leg: No edema.     Comments: Left wrist, left hip pain. Swelling LUE>RLE, LLE>RLE  Skin:    General: Skin is warm and dry.     Findings: Bruising present.     Comments: Left wrist in case/sling. Left hips surgical incision x3 covered with dressing, dressing looks clean.   Neurological:     General: No focal deficit present.     Mental Status: She is alert. Mental status is at baseline.     Motor: No weakness.     Coordination: Coordination normal.     Gait: Gait abnormal.     Comments: Oriented to person, followed simple directions.   Psychiatric:        Mood and Affect: Mood normal.        Behavior: Behavior normal.     Labs reviewed: Recent Labs    04/10/20 0155 04/11/20 0136 04/14/20 0354  NA 138 141 142  K 3.9 3.5 3.4*  CL 104 108 103  CO2 '23 23 25  ' GLUCOSE 150* 106* 101*  BUN '12 16 17  ' CREATININE 0.62 0.57 0.61  CALCIUM 8.5* 8.0* 8.3*   Recent Labs    08/16/19 0800 12/13/19 0000 01/10/20 0000  AST '14 15 18  ' ALT '14 11 17  ' ALKPHOS  --   --  124  BILITOT 0.4 0.4  --   PROT 6.4 6.5  --   ALBUMIN  --   --  3.5   Recent Labs    01/10/20 0000 01/21/20 0000 01/28/20 0000 02/07/20 0000 04/07/20 1921 04/08/20 0410 04/11/20 0136 04/12/20 0200 04/12/20 1841 04/14/20 0354  WBC 14.3   < > 7.2   < > 14.1*   < > 10.2 9.0  --  11.0*  NEUTROABS 10,082.00  --  4,673.00  --  10.0*  --   --   --   --   --  HGB 13.6   < > 12.0   < > 11.3*   < > 8.1* 7.5* 9.1* 9.4*  HCT 42   < > 37   < > 35.1*   < > 26.2* 23.4* 27.0* 30.9*  MCV  --   --   --   --  82.2   < > 84.8 83.9  --  85.8  PLT 523*   < > 282   < >  341   < > 276 270  --  317   < > = values in this interval not displayed.   Lab Results  Component Value Date   TSH 3.509 12/29/2019   Lab Results  Component Value Date   HGBA1C 5.5 04/07/2020   Lab Results  Component Value Date   CHOL 109 12/13/2019   HDL 36 (L) 12/13/2019   LDLCALC 51 12/13/2019   TRIG 132 12/13/2019   CHOLHDL 3.0 12/13/2019    Significant Diagnostic Results in last 30 days:  DG Chest 1 View  Result Date: 04/07/2020 CLINICAL DATA:  Found down, dementia EXAM: CHEST  1 VIEW COMPARISON:  None. FINDINGS: Single frontal view of the chest demonstrates an unremarkable cardiac silhouette. No airspace disease, effusion, or pneumothorax. No acute bony abnormalities. IMPRESSION: 1. No acute intrathoracic process. Electronically Signed   By: Randa Ngo M.D.   On: 04/07/2020 20:24   DG Wrist Complete Left  Result Date: 04/09/2020 CLINICAL DATA:  Close reduction left wrist fracture EXAM: LEFT WRIST - COMPLETE 3+ VIEW COMPARISON:  04/07/2020 FINDINGS: Two fluoroscopic images are obtained during the performance of the procedure and are provided for interpretation only. There has been interval reduction of the comminuted distal left radial fracture seen previously, now with near anatomic alignment. Please refer to the operative report. FLUOROSCOPY TIME:  8 seconds IMPRESSION: 1. Reduction of distal left radial fracture with near anatomic alignment. Electronically Signed   By: Randa Ngo M.D.   On: 04/09/2020 18:07   DG Wrist Complete Left  Result Date: 04/07/2020 CLINICAL DATA:  Post reduction film of known left wrist fracture. EXAM: LEFT WRIST - COMPLETE 3+ VIEW COMPARISON:  None. FINDINGS: Splinting material obscures fine bony and soft tissue detail. Interval splinting with similar alignment of the displaced, impacted dorsally angulated fracture through the distal radius. IMPRESSION: Interval splinting with similar alignment of the distal radial fracture. Electronically Signed    By: Dahlia Bailiff MD   On: 04/07/2020 23:11   DG Wrist Complete Left  Result Date: 04/07/2020 CLINICAL DATA:  Pain EXAM: LEFT WRIST - COMPLETE 3+ VIEW COMPARISON:  None. FINDINGS: There is an acute, displaced and impacted fracture through the distal radius with significant dorsal angulation. There is surrounding soft tissue swelling. No frank dislocation. There is an old healed fracture of the fourth metacarpal. There are degenerative changes of the partially visualized interphalangeal joints. IMPRESSION: Acute, displaced, impacted fracture through the distal radius with significant dorsal angulation. Electronically Signed   By: Constance Holster M.D.   On: 04/07/2020 20:24   DG Ankle Complete Left  Result Date: 04/07/2020 CLINICAL DATA:  Pain EXAM: LEFT ANKLE COMPLETE - 3+ VIEW COMPARISON:  None. FINDINGS: There is no evidence of fracture, dislocation, or joint effusion. There is no evidence of arthropathy or other focal bone abnormality. Soft tissues are unremarkable. IMPRESSION: Negative. Electronically Signed   By: Constance Holster M.D.   On: 04/07/2020 20:25   CT HEAD WO CONTRAST  Result Date: 04/07/2020 CLINICAL DATA:  Facial trauma EXAM:  CT HEAD WITHOUT CONTRAST TECHNIQUE: Contiguous axial images were obtained from the base of the skull through the vertex without intravenous contrast. COMPARISON:  None. FINDINGS: Brain: No evidence of acute territorial infarction, hemorrhage, hydrocephalus,extra-axial collection or mass lesion/mass effect. There is dilatation the ventricles and sulci consistent with age-related atrophy. Low-attenuation changes in the deep white matter consistent with small vessel ischemia. Vascular: No hyperdense vessel or unexpected calcification. Skull: The skull is intact. No fracture or focal lesion identified. Mild soft tissue swelling seen over the left frontal skull. Sinuses/Orbits: The visualized paranasal sinuses and mastoid air cells are clear. The orbits and globes  intact. Other: None Cervical spine: Alignment: Physiologic Skull base and vertebrae: Visualized skull base is intact. No atlanto-occipital dissociation. The vertebral body heights are well maintained. No fracture or pathologic osseous lesion seen. Soft tissues and spinal canal: The visualized paraspinal soft tissues are unremarkable. No prevertebral soft tissue swelling is seen. The spinal canal is grossly unremarkable, no large epidural collection or significant canal narrowing. Disc levels: Multilevel cervical spine spondylosis seen with disc osteophyte complex and uncovertebral osteophytes most notable at C4-C5 and C5-C6. Upper chest: The lung apices are clear. Thoracic inlet is within normal limits. Other: None IMPRESSION: No acute intracranial abnormality. Findings consistent with age related atrophy and chronic small vessel ischemia No acute fracture or malalignment of the spine. Electronically Signed   By: Prudencio Pair M.D.   On: 04/07/2020 20:43   CT CERVICAL SPINE WO CONTRAST  Result Date: 04/07/2020 CLINICAL DATA:  Facial trauma EXAM: CT HEAD WITHOUT CONTRAST TECHNIQUE: Contiguous axial images were obtained from the base of the skull through the vertex without intravenous contrast. COMPARISON:  None. FINDINGS: Brain: No evidence of acute territorial infarction, hemorrhage, hydrocephalus,extra-axial collection or mass lesion/mass effect. There is dilatation the ventricles and sulci consistent with age-related atrophy. Low-attenuation changes in the deep white matter consistent with small vessel ischemia. Vascular: No hyperdense vessel or unexpected calcification. Skull: The skull is intact. No fracture or focal lesion identified. Mild soft tissue swelling seen over the left frontal skull. Sinuses/Orbits: The visualized paranasal sinuses and mastoid air cells are clear. The orbits and globes intact. Other: None Cervical spine: Alignment: Physiologic Skull base and vertebrae: Visualized skull base is  intact. No atlanto-occipital dissociation. The vertebral body heights are well maintained. No fracture or pathologic osseous lesion seen. Soft tissues and spinal canal: The visualized paraspinal soft tissues are unremarkable. No prevertebral soft tissue swelling is seen. The spinal canal is grossly unremarkable, no large epidural collection or significant canal narrowing. Disc levels: Multilevel cervical spine spondylosis seen with disc osteophyte complex and uncovertebral osteophytes most notable at C4-C5 and C5-C6. Upper chest: The lung apices are clear. Thoracic inlet is within normal limits. Other: None IMPRESSION: No acute intracranial abnormality. Findings consistent with age related atrophy and chronic small vessel ischemia No acute fracture or malalignment of the spine. Electronically Signed   By: Prudencio Pair M.D.   On: 04/07/2020 20:43   DG Shoulder Right Port  Result Date: 04/13/2020 CLINICAL DATA:  Right shoulder pain. EXAM: PORTABLE RIGHT SHOULDER COMPARISON:  None. FINDINGS: No acute fracture or dislocation. Small well corticated ossific density near the superior glenoid, potentially related to old trauma versus small intra-articular body. Old healed fracture of the right mid clavicle. Small humeral head marginal osteophytes. Joint spaces are preserved. Soft tissues are unremarkable. IMPRESSION: 1. Early glenohumeral osteoarthritis. No acute osseous abnormality. Electronically Signed   By: Titus Dubin M.D.   On: 04/13/2020 12:01  DG C-Arm 1-60 Min  Result Date: 04/09/2020 CLINICAL DATA:  ORIF left femur. EXAM: DG C-ARM 1-60 MIN; LEFT FEMUR 2 VIEWS FLUOROSCOPY TIME:  Fluoroscopy Time:  1 minutes 41 seconds Reported radiation: 17 mGy. COMPARISON:  April 07, 2020 FINDINGS: Four C-arm fluoroscopic images were obtained intraoperatively and submitted for post operative interpretation. These images demonstrate fixation of an inter trochanteric fracture of the left femur with intramedullary nail  and screws. Improved, near anatomic alignment. No unexpected findings. Please see the performing provider's procedural report for further detail. IMPRESSION: Intraoperative fluoroscopic imaging, as detailed above. Electronically Signed   By: Margaretha Sheffield MD   On: 04/09/2020 17:10   DG Hip Unilat With Pelvis 2-3 Views Left  Result Date: 04/07/2020 CLINICAL DATA:  Pain EXAM: DG HIP (WITH OR WITHOUT PELVIS) 2-3V LEFT COMPARISON:  None. FINDINGS: There is an acute, comminuted and displaced intratrochanteric fracture of the proximal left femur. There is no dislocation. There is osteopenia. There are moderate degenerative changes of both hips. IMPRESSION: Acute, comminuted and displaced intratrochanteric fracture of the proximal left femur. Electronically Signed   By: Constance Holster M.D.   On: 04/07/2020 20:24   DG FEMUR MIN 2 VIEWS LEFT  Result Date: 04/09/2020 CLINICAL DATA:  ORIF left femur. EXAM: DG C-ARM 1-60 MIN; LEFT FEMUR 2 VIEWS FLUOROSCOPY TIME:  Fluoroscopy Time:  1 minutes 41 seconds Reported radiation: 17 mGy. COMPARISON:  April 07, 2020 FINDINGS: Four C-arm fluoroscopic images were obtained intraoperatively and submitted for post operative interpretation. These images demonstrate fixation of an inter trochanteric fracture of the left femur with intramedullary nail and screws. Improved, near anatomic alignment. No unexpected findings. Please see the performing provider's procedural report for further detail. IMPRESSION: Intraoperative fluoroscopic imaging, as detailed above. Electronically Signed   By: Margaretha Sheffield MD   On: 04/09/2020 17:10    Assessment/Plan Closed left hip fracture ([place]) Hospitalized 04/07/20-04/14/20 for closed left hip fracture, s/p ORIF 04/09/20,  f/u Ortho x 2weeks WBAT, Lovenox for DVT PPX, prn Oxycodone, Methocarbamol prn, Tylenol tid for pain   Unspecified fracture of the lower end of left radius, initial encounter for closed fracture L arm/the lower end of  left radius, s/p closed reduction,  sling, hard cast, MWB   Urinary retention Urinary retention, Foley, Flomax, voiding trial tomorrow   Mixed hyperlipidemia LD 51 12/13/19, continue Atorvastatin.   Tachycardia Heart rate is in control, continue Metoprolol.   Depression with anxiety takes Sertraline, Quetiapine   Senile dementia, delirium (HCC) sundowning  Frequent falls Recent fall, resulted in left hip, left wrist fxs, no safety awareness, limited mobility are contributory.   Hypothyroidism takes Levothyroxine, TSH 3.509 12/29/19   HTN (hypertension) takes Metoprolol, Losartan, Bun/creat 17/0.61 04/14/20   Diabetes mellitus type 2 in obese [facility]) takes Glimepiride, Hgb a1c 5.5 04/07/20   Slow transit constipation takes MiraLax prn, Colace bid   Anemia, blood loss hgb 9.2 04/14/20, needs f/u CBC, may Iron supplement.      Family/ staff Communication: plan of care reviewed with the patient and charge nurse.   Labs/tests ordered:  none  Time spend 35 minutes.

## 2020-04-15 NOTE — Assessment & Plan Note (Signed)
takes Metoprolol, Losartan, Bun/creat 17/0.61 04/14/20

## 2020-04-15 NOTE — Assessment & Plan Note (Signed)
Urinary retention, Foley, Flomax, voiding trial tomorrow

## 2020-04-15 NOTE — Assessment & Plan Note (Signed)
LD 51 12/13/19, continue Atorvastatin.

## 2020-04-15 NOTE — Assessment & Plan Note (Signed)
L arm/the lower end of left radius, s/p closed reduction,  sling, hard cast, MWB

## 2020-04-15 NOTE — Assessment & Plan Note (Signed)
Hospitalized 04/07/20-04/14/20 for closed left hip fracture, s/p ORIF 04/09/20,  f/u Ortho x 2weeks WBAT, Lovenox for DVT PPX, prn Oxycodone, Methocarbamol prn, Tylenol tid for pain

## 2020-04-15 NOTE — Assessment & Plan Note (Signed)
Heart rate is in control, continue Metoprolol  

## 2020-04-16 ENCOUNTER — Encounter: Payer: Self-pay | Admitting: Internal Medicine

## 2020-04-16 ENCOUNTER — Non-Acute Institutional Stay (SKILLED_NURSING_FACILITY): Payer: Medicare Other | Admitting: Internal Medicine

## 2020-04-16 ENCOUNTER — Encounter: Payer: Self-pay | Admitting: Nurse Practitioner

## 2020-04-16 DIAGNOSIS — I10 Essential (primary) hypertension: Secondary | ICD-10-CM | POA: Diagnosis not present

## 2020-04-16 DIAGNOSIS — E669 Obesity, unspecified: Secondary | ICD-10-CM

## 2020-04-16 DIAGNOSIS — E782 Mixed hyperlipidemia: Secondary | ICD-10-CM

## 2020-04-16 DIAGNOSIS — F0391 Unspecified dementia with behavioral disturbance: Secondary | ICD-10-CM

## 2020-04-16 DIAGNOSIS — F05 Delirium due to known physiological condition: Secondary | ICD-10-CM | POA: Diagnosis not present

## 2020-04-16 DIAGNOSIS — R000 Tachycardia, unspecified: Secondary | ICD-10-CM | POA: Diagnosis not present

## 2020-04-16 DIAGNOSIS — S52502A Unspecified fracture of the lower end of left radius, initial encounter for closed fracture: Secondary | ICD-10-CM

## 2020-04-16 DIAGNOSIS — R339 Retention of urine, unspecified: Secondary | ICD-10-CM

## 2020-04-16 DIAGNOSIS — E1169 Type 2 diabetes mellitus with other specified complication: Secondary | ICD-10-CM | POA: Diagnosis not present

## 2020-04-16 DIAGNOSIS — F418 Other specified anxiety disorders: Secondary | ICD-10-CM

## 2020-04-16 DIAGNOSIS — F03918 Unspecified dementia, unspecified severity, with other behavioral disturbance: Secondary | ICD-10-CM

## 2020-04-16 DIAGNOSIS — R296 Repeated falls: Secondary | ICD-10-CM

## 2020-04-16 DIAGNOSIS — S72002D Fracture of unspecified part of neck of left femur, subsequent encounter for closed fracture with routine healing: Secondary | ICD-10-CM

## 2020-04-16 DIAGNOSIS — E039 Hypothyroidism, unspecified: Secondary | ICD-10-CM | POA: Diagnosis not present

## 2020-04-16 NOTE — Progress Notes (Signed)
Location: [facility] Room Number: 102 Place of Service:  SNF [phone])  Provider: Veleta Miners MD  Code Status: DNR Goals of Care:  Advanced Directives 04/07/2020  Does Patient Have a Medical Advance Directive? No;Yes  Type of Advance Directive Out of facility DNR (pink MOST or yellow form);[place];Living will  Does patient want to make changes to medical advance directive? No - Patient declined  Copy of [place] in Chart? No - copy available, Physician notified  Would patient like information on creating a medical advance directive? No - Patient declined  Pre-existing out of facility DNR order (yellow form or pink MOST form) [place]/Yellow Form available - Physician notified to receive inpatient order     Chief Complaint  Patient presents with  . Readmit To SNF    Readmission to SNF    HPI: [name] a 87 y.o. female seen today for an acute visit for Readmission in SNF for therapy  Admitted to hospital from 2/7-2/14 for left hip fracture s/p ORIF on 2/9 and fracture of lower end of radius s/p reduction   Patient has a history of type 2 diabetes, hyperlipidemia, depression, dementia, hypothyroidism, osteopenia, stress and urge urinary incontinence Patient was admitted in the hospital from 10/27-10/31 with L4 burst fracture after the fall Was in SNF for therapy  Patient was having behavior issues in the facility specially trying to get up by herself. Patient from her wheelchair and went outside as she wanted to go home.  She hit the curb and fell.  In ED was found to have left hip fracture and left distal radius fracture Head CT and neck CT were negative Underwent ORIF on 2/9 Postop was complicated by anemia needing transfusion.  Also had to have a Foley placement for urinary retention. Patient back in the facility Did not have any acute complaints today.  Has not had any behavior issues.  Pain seems to be  controlled.  Mental status at baseline.  No fever or coughing.   Past Medical History:  Diagnosis Date  . Hearing loss   . History of fracture of clavicle   . Hypothyroidism   . Insomnia   . Tinnitus     Past Surgical History:  Procedure Laterality Date  . CHOLECYSTECTOMY  2007   Dr. Verdene Lennert  . CLAVICLE EXCISION  1986  . INTRAMEDULLARY (IM) NAIL INTERTROCHANTERIC Left 04/09/2020   Procedure: LEFT INTERTROCHANTERIC INTRAMEDULLARY (IM) NAIL;  Surgeon: Leandrew Koyanagi, MD;  Location: [facility];  Service: Orthopedics;  Laterality: Left;  . MOHS SURGERY     past 10 years chest & legs  . OPEN REDUCTION INTERNAL FIXATION (ORIF) DISTAL RADIAL FRACTURE Left 04/09/2020   Procedure: CLOSED REDUCTION LEFT DISTAL RADIUS FRACTURE;  Surgeon: Leandrew Koyanagi, MD;  Location: [facility];  Service: Orthopedics;  Laterality: Left;    Allergies  Allergen Reactions  . Metformin And Related Diarrhea    Outpatient Encounter Medications as of 04/16/2020  Medication Sig  . acetaminophen (TYLENOL) 325 MG tablet Take 650 mg by mouth in the morning, at noon, and at bedtime.  Marland Kitchen atorvastatin (LIPITOR) 10 MG tablet Take 10 mg by mouth daily.  . Blood Glucose Monitoring Suppl KIT Use to test blood sugar once daily daily. Dx: E11.69  . Cholecalciferol (VITAMIN D3) 2000 units TABS Take 1 tablet by mouth daily.   Marland Kitchen docusate sodium (COLACE) 100 MG capsule Take 1 capsule (100 mg total) by mouth 2 (two) times  daily.  . enoxaparin (LOVENOX) 40 MG/0.4ML injection Inject 0.4 mLs (40 mg total) into the skin daily for 14 days.  Marland Kitchen glimepiride (AMARYL) 2 MG tablet Take 2 mg by mouth daily with breakfast.  . glucose blood test strip Use to test blood sugar once daily. Dx: E11.69  . Lancets 30G MISC Use to test blood sugar once daily. Dx: E11.69  . Lancets Misc. KIT 1 Device by Does not apply route daily. Use as directed  . levothyroxine (SYNTHROID) 75 MCG tablet TAKE 1 TABLET (75 MCG TOTAL) BY MOUTH DAILY BEFORE BREAKFAST.  Marland Kitchen losartan  (COZAAR) 25 MG tablet Take 25 mg by mouth daily.  . methocarbamol (ROBAXIN) 500 MG tablet Take 1 tablet (500 mg total) by mouth every 8 (eight) hours as needed for muscle spasms.  . metoprolol tartrate (LOPRESSOR) 25 MG tablet Take 12.5 mg by mouth 2 (two) times daily.  . Nutritional Supplements (BOOST GLUCOSE CONTROL) LIQD Take by mouth. 1 can three times a day between meals  . oxyCODONE (OXY IR/ROXICODONE) 5 MG immediate release tablet Take 1 tablet (5 mg total) by mouth every 6 (six) hours as needed for moderate pain or severe pain.  . polyethylene glycol (MIRALAX / GLYCOLAX) 17 g packet Take 17 g by mouth daily as needed for moderate constipation.  . QUEtiapine (SEROQUEL) 25 MG tablet Take 12.5 mg by mouth at bedtime.  . sertraline (ZOLOFT) 25 MG tablet Take 50 mg by mouth daily.   . tamsulosin (FLOMAX) 0.4 MG CAPS capsule Take 1 capsule (0.4 mg total) by mouth daily for 5 days.  Marland Kitchen zinc oxide 20 % ointment Apply 1 application topically as needed for irritation.   No facility-administered encounter medications on file as of 04/16/2020.    Review of Systems:  Review of Systems  Constitutional: Positive for activity change.  HENT: Negative.   Respiratory: Negative.   Cardiovascular: Negative.   Gastrointestinal: Negative.   Genitourinary: Positive for difficulty urinating.  Musculoskeletal: Positive for arthralgias, gait problem and myalgias.  Skin: Negative.   Neurological: Positive for weakness.  Psychiatric/Behavioral: Positive for behavioral problems and confusion.    Health Maintenance  Topic Date Due  . OPHTHALMOLOGY EXAM  Never done  . COVID-19 Vaccine (3 - Booster for Moderna series) 09/30/2019  . TETANUS/TDAP  11/24/2026 (Originally 08/19/1947)  . FOOT EXAM  07/10/2020  . HEMOGLOBIN A1C  10/05/2020  . INFLUENZA VACCINE  Completed  . DEXA SCAN  Completed  . PNA vac Low Risk Adult  Completed    Physical Exam: Vitals:   04/16/20 1316  BP: (!) 154/89  Pulse: 90  Resp:  16  Temp: (!) 97.1 F (36.2 C)  SpO2: 96%  Weight: 153 lb (69.4 kg)  Height: 5' (1.524 m)   Body mass index is 29.88 kg/m. Physical Exam Vitals reviewed.  Constitutional:      Appearance: Normal appearance.  HENT:     Head: Normocephalic.     Nose: Nose normal.     Mouth/Throat:     Mouth: Mucous membranes are moist.     Pharynx: Oropharynx is clear.  Eyes:     Pupils: Pupils are equal, round, and reactive to light.  Cardiovascular:     Rate and Rhythm: Normal rate and regular rhythm.     Pulses: Normal pulses.  Pulmonary:     Effort: Pulmonary effort is normal.     Breath sounds: Normal breath sounds.  Abdominal:     General: Abdomen is flat. Bowel sounds are normal.  Palpations: Abdomen is soft.  Musculoskeletal:        General: No swelling.     Cervical back: Neck supple.  Skin:    General: Skin is warm.  Neurological:     General: No focal deficit present.     Mental Status: She is alert.  Psychiatric:        Mood and Affect: Mood normal.        Thought Content: Thought content normal.     Labs reviewed: Basic Metabolic Panel: Recent Labs    12/29/19 0606 01/10/20 0000 04/10/20 0155 04/11/20 0136 04/14/20 0354  NA  --    < > 138 141 142  K  --    < > 3.9 3.5 3.4*  CL  --    < > 104 108 103  CO2  --    < > _0 GLUCOSE  --    < > 150* 106* 101*  BUN  --    < > _1 CREATININE  --    < > 0.62 0.57 0.61  CALCIUM  --    < > 8.5* 8.0* 8.3*  TSH 3.509  --   --   --   --    < > = values in this interval not displayed.   Liver Function Tests: Recent Labs    08/16/19 0800 12/13/19 0000 01/10/20 0000  AST _2 ALT _3 ALKPHOS  --   --  124  BILITOT 0.4 0.4  --   PROT 6.4 6.5  --   ALBUMIN  --   --  3.5   No results for input(s): LIPASE, AMYLASE in the last 8760 hours. No results for input(s): AMMONIA in the last 8760 hours. CBC: Recent Labs    01/10/20 0000 01/21/20 0000 01/28/20 0000 02/07/20 0000 04/07/20 1921  04/08/20 0410 04/11/20 0136 04/12/20 0200 04/12/20 1841 04/14/20 0354  WBC 14.3   < > 7.2   < > 14.1*   < > 10.2 9.0  --  11.0*  NEUTROABS 10,082.00  --  4,673.00  --  10.0*  --   --   --   --   --   HGB 13.6   < > 12.0   < > 11.3*   < > 8.1* 7.5* 9.1* 9.4*  HCT 42   < > 37   < > 35.1*   < > 26.2* 23.4* 27.0* 30.9*  MCV  --   --   --   --  82.2   < > 84.8 83.9  --  85.8  PLT 523*   < > 282   < > 341   < > 276 270  --  317   < > = values in this interval not displayed.   Lipid Panel: Recent Labs    12/13/19 0000  CHOL 109  HDL 36*  LDLCALC 51  TRIG 132  CHOLHDL 3.0   Lab Results  Component Value Date   HGBA1C 5.5 04/07/2020    Procedures since last visit: DG Chest 1 View  Result Date: 04/07/2020 CLINICAL DATA:  Found down, dementia EXAM: CHEST  1 VIEW COMPARISON:  None. FINDINGS: Single frontal view of the chest demonstrates an unremarkable cardiac silhouette. No airspace disease, effusion, or pneumothorax. No acute bony abnormalities. IMPRESSION: 1. No acute intrathoracic process. Electronically Signed   By: Randa Ngo M.D.   On: 04/07/2020 20:24   DG Wrist Complete Left  Result Date: 04/09/2020 CLINICAL DATA:  Close reduction left wrist fracture EXAM: LEFT WRIST - COMPLETE 3+ VIEW COMPARISON:  04/07/2020 FINDINGS: Two fluoroscopic images are obtained during the performance of the procedure and are provided for interpretation only. There has been interval reduction of the comminuted distal left radial fracture seen previously, now with near anatomic alignment. Please refer to the operative report. FLUOROSCOPY TIME:  8 seconds IMPRESSION: 1. Reduction of distal left radial fracture with near anatomic alignment. Electronically Signed   By: Randa Ngo M.D.   On: 04/09/2020 18:07   DG Wrist Complete Left  Result Date: 04/07/2020 CLINICAL DATA:  Post reduction film of known left wrist fracture. EXAM: LEFT WRIST - COMPLETE 3+ VIEW COMPARISON:  None. FINDINGS: Splinting material  obscures fine bony and soft tissue detail. Interval splinting with similar alignment of the displaced, impacted dorsally angulated fracture through the distal radius. IMPRESSION: Interval splinting with similar alignment of the distal radial fracture. Electronically Signed   By: Dahlia Bailiff MD   On: 04/07/2020 23:11   DG Wrist Complete Left  Result Date: 04/07/2020 CLINICAL DATA:  Pain EXAM: LEFT WRIST - COMPLETE 3+ VIEW COMPARISON:  None. FINDINGS: There is an acute, displaced and impacted fracture through the distal radius with significant dorsal angulation. There is surrounding soft tissue swelling. No frank dislocation. There is an old healed fracture of the fourth metacarpal. There are degenerative changes of the partially visualized interphalangeal joints. IMPRESSION: Acute, displaced, impacted fracture through the distal radius with significant dorsal angulation. Electronically Signed   By: Constance Holster M.D.   On: 04/07/2020 20:24   DG Ankle Complete Left  Result Date: 04/07/2020 CLINICAL DATA:  Pain EXAM: LEFT ANKLE COMPLETE - 3+ VIEW COMPARISON:  None. FINDINGS: There is no evidence of fracture, dislocation, or joint effusion. There is no evidence of arthropathy or other focal bone abnormality. Soft tissues are unremarkable. IMPRESSION: Negative. Electronically Signed   By: Constance Holster M.D.   On: 04/07/2020 20:25   CT HEAD WO CONTRAST  Result Date: 04/07/2020 CLINICAL DATA:  Facial trauma EXAM: CT HEAD WITHOUT CONTRAST TECHNIQUE: Contiguous axial images were obtained from the base of the skull through the vertex without intravenous contrast. COMPARISON:  None. FINDINGS: Brain: No evidence of acute territorial infarction, hemorrhage, hydrocephalus,extra-axial collection or mass lesion/mass effect. There is dilatation the ventricles and sulci consistent with age-related atrophy. Low-attenuation changes in the deep white matter consistent with small vessel ischemia. Vascular: No  hyperdense vessel or unexpected calcification. Skull: The skull is intact. No fracture or focal lesion identified. Mild soft tissue swelling seen over the left frontal skull. Sinuses/Orbits: The visualized paranasal sinuses and mastoid air cells are clear. The orbits and globes intact. Other: None Cervical spine: Alignment: Physiologic Skull base and vertebrae: Visualized skull base is intact. No atlanto-occipital dissociation. The vertebral body heights are well maintained. No fracture or pathologic osseous lesion seen. Soft tissues and spinal canal: The visualized paraspinal soft tissues are unremarkable. No prevertebral soft tissue swelling is seen. The spinal canal is grossly unremarkable, no large epidural collection or significant canal narrowing. Disc levels: Multilevel cervical spine spondylosis seen with disc osteophyte complex and uncovertebral osteophytes most notable at C4-C5 and C5-C6. Upper chest: The lung apices are clear. Thoracic inlet is within normal limits. Other: None IMPRESSION: No acute intracranial abnormality. Findings consistent with age related atrophy and chronic small vessel ischemia No acute fracture or malalignment of the spine. Electronically Signed   By: Ebony Cargo.D.  On: 04/07/2020 20:43   CT CERVICAL SPINE WO CONTRAST  Result Date: 04/07/2020 CLINICAL DATA:  Facial trauma EXAM: CT HEAD WITHOUT CONTRAST TECHNIQUE: Contiguous axial images were obtained from the base of the skull through the vertex without intravenous contrast. COMPARISON:  None. FINDINGS: Brain: No evidence of acute territorial infarction, hemorrhage, hydrocephalus,extra-axial collection or mass lesion/mass effect. There is dilatation the ventricles and sulci consistent with age-related atrophy. Low-attenuation changes in the deep white matter consistent with small vessel ischemia. Vascular: No hyperdense vessel or unexpected calcification. Skull: The skull is intact. No fracture or focal lesion identified.  Mild soft tissue swelling seen over the left frontal skull. Sinuses/Orbits: The visualized paranasal sinuses and mastoid air cells are clear. The orbits and globes intact. Other: None Cervical spine: Alignment: Physiologic Skull base and vertebrae: Visualized skull base is intact. No atlanto-occipital dissociation. The vertebral body heights are well maintained. No fracture or pathologic osseous lesion seen. Soft tissues and spinal canal: The visualized paraspinal soft tissues are unremarkable. No prevertebral soft tissue swelling is seen. The spinal canal is grossly unremarkable, no large epidural collection or significant canal narrowing. Disc levels: Multilevel cervical spine spondylosis seen with disc osteophyte complex and uncovertebral osteophytes most notable at C4-C5 and C5-C6. Upper chest: The lung apices are clear. Thoracic inlet is within normal limits. Other: None IMPRESSION: No acute intracranial abnormality. Findings consistent with age related atrophy and chronic small vessel ischemia No acute fracture or malalignment of the spine. Electronically Signed   By: Prudencio Pair M.D.   On: 04/07/2020 20:43   DG Shoulder Right Port  Result Date: 04/13/2020 CLINICAL DATA:  Right shoulder pain. EXAM: PORTABLE RIGHT SHOULDER COMPARISON:  None. FINDINGS: No acute fracture or dislocation. Small well corticated ossific density near the superior glenoid, potentially related to old trauma versus small intra-articular body. Old healed fracture of the right mid clavicle. Small humeral head marginal osteophytes. Joint spaces are preserved. Soft tissues are unremarkable. IMPRESSION: 1. Early glenohumeral osteoarthritis. No acute osseous abnormality. Electronically Signed   By: Titus Dubin M.D.   On: 04/13/2020 12:01   DG C-Arm 1-60 Min  Result Date: 04/09/2020 CLINICAL DATA:  ORIF left femur. EXAM: DG C-ARM 1-60 MIN; LEFT FEMUR 2 VIEWS FLUOROSCOPY TIME:  Fluoroscopy Time:  1 minutes 41 seconds Reported  radiation: 17 mGy. COMPARISON:  April 07, 2020 FINDINGS: Four C-arm fluoroscopic images were obtained intraoperatively and submitted for post operative interpretation. These images demonstrate fixation of an inter trochanteric fracture of the left femur with intramedullary nail and screws. Improved, near anatomic alignment. No unexpected findings. Please see the performing provider's procedural report for further detail. IMPRESSION: Intraoperative fluoroscopic imaging, as detailed above. Electronically Signed   By: Margaretha Sheffield MD   On: 04/09/2020 17:10   DG Hip Unilat With Pelvis 2-3 Views Left  Result Date: 04/07/2020 CLINICAL DATA:  Pain EXAM: DG HIP (WITH OR WITHOUT PELVIS) 2-3V LEFT COMPARISON:  None. FINDINGS: There is an acute, comminuted and displaced intratrochanteric fracture of the proximal left femur. There is no dislocation. There is osteopenia. There are moderate degenerative changes of both hips. IMPRESSION: Acute, comminuted and displaced intratrochanteric fracture of the proximal left femur. Electronically Signed   By: Constance Holster M.D.   On: 04/07/2020 20:24   DG FEMUR MIN 2 VIEWS LEFT  Result Date: 04/09/2020 CLINICAL DATA:  ORIF left femur. EXAM: DG C-ARM 1-60 MIN; LEFT FEMUR 2 VIEWS FLUOROSCOPY TIME:  Fluoroscopy Time:  1 minutes 41 seconds Reported radiation: 17 mGy. COMPARISON:  April 07, 2020 FINDINGS: Four C-arm fluoroscopic images were obtained intraoperatively and submitted for post operative interpretation. These images demonstrate fixation of an inter trochanteric fracture of the left femur with intramedullary nail and screws. Improved, near anatomic alignment. No unexpected findings. Please see the performing provider's procedural report for further detail. IMPRESSION: Intraoperative fluoroscopic imaging, as detailed above. Electronically Signed   By: Margaretha Sheffield MD   On: 04/09/2020 17:10    Assessment/Plan Closed fracture of left hip with routine healing,  subsequent encounter Pain Controlled WBAT On Lovenox for DVT prophylaxis Changed Robaxin to 250 mg to avoid excessive sedation Fracture of the lower end of left radius status post closed reduction   Continue with Sling NWB  Per nurses having difficult time Feeding herself Needing assist Urinary retention Voiding trial today was succesful Foley is out Flomax would be discontinued soon Post op Anemia Hgb today was 9.1 Mixed hyperlipidemia Continue Statin Tachycardia Doing well on Low dose of Lopressor Depression with anxiety Continue on Zoloft Also on Seroquel Senile dementia with delirium with behavioral disturbance (HCC) On Seroquel Off Depakote Frequent falls Due to less awareness cognitive deficits Hypothyroidism, unspecified type TSH normal in 10/21 Primary hypertension On Cozaar and Lopressor Diabetes mellitus type 2 in obese (HCC) Will reduce Amaryl to 1 mg as A1c Less then 6  Labs/tests ordered:  * No order type specified * Next appt:  Visit date not found

## 2020-04-17 LAB — BASIC METABOLIC PANEL
BUN: 17 (ref 4–21)
CO2: 26 — AB (ref 13–22)
Chloride: 107 (ref 99–108)
Creatinine: 0.5 (ref 0.5–1.1)
Glucose: 97
Potassium: 3.7 (ref 3.4–5.3)
Sodium: 142 (ref 137–147)

## 2020-04-17 LAB — HEPATIC FUNCTION PANEL
ALT: 11 (ref 7–35)
AST: 16 (ref 13–35)
Alkaline Phosphatase: 74 (ref 25–125)
Bilirubin, Total: 0.5

## 2020-04-17 LAB — LIPID PANEL
Cholesterol: 95 (ref 0–200)
HDL: 22 — AB (ref 35–70)
LDL Cholesterol: 50
LDl/HDL Ratio: 4.3
Triglycerides: 150 (ref 40–160)

## 2020-04-17 LAB — COMPREHENSIVE METABOLIC PANEL
Albumin: 2.6 — AB (ref 3.5–5.0)
Calcium: 8 — AB (ref 8.7–10.7)
Globulin: 2.4

## 2020-04-23 ENCOUNTER — Other Ambulatory Visit: Payer: Self-pay

## 2020-04-23 ENCOUNTER — Encounter: Payer: Self-pay | Admitting: Orthopaedic Surgery

## 2020-04-23 ENCOUNTER — Ambulatory Visit (INDEPENDENT_AMBULATORY_CARE_PROVIDER_SITE_OTHER): Payer: Medicare Other | Admitting: Orthopaedic Surgery

## 2020-04-23 ENCOUNTER — Ambulatory Visit (INDEPENDENT_AMBULATORY_CARE_PROVIDER_SITE_OTHER): Payer: Medicare Other

## 2020-04-23 ENCOUNTER — Encounter: Payer: Self-pay | Admitting: Internal Medicine

## 2020-04-23 DIAGNOSIS — S52502D Unspecified fracture of the lower end of left radius, subsequent encounter for closed fracture with routine healing: Secondary | ICD-10-CM | POA: Diagnosis not present

## 2020-04-23 DIAGNOSIS — S72002D Fracture of unspecified part of neck of left femur, subsequent encounter for closed fracture with routine healing: Secondary | ICD-10-CM | POA: Diagnosis not present

## 2020-04-23 NOTE — Progress Notes (Signed)
Post-Op Visit Note   Patient: Lisa Castillo           Date of Birth: 09/14/1928           MRN: [id number] Visit Date: 04/23/2020 PCP: [name], MD   Assessment & Plan:  Chief Complaint:  Chief Complaint  Patient presents with  . Left Hip - Pain  . Left Wrist - Pain   Visit Diagnoses:  1. Closed fracture of left hip with routine healing, subsequent encounter   2. Closed fracture of distal end of left radius with routine healing, unspecified fracture morphology, subsequent encounter     Plan: [name] a pleasant 87 year old female who comes in today with her daughter.  She is 2 weeks out left hip IM nail from an intertrochanteric femur fracture as well as closed reduction distal radius fracture on the left.  She has been doing well.  She has been compliant nonweightbearing left upper extremity.  She has recently been able to get into a wheelchair.  Her pain is being relieved with Tylenol.  Examination of her left wrist reveals mild swelling.  Fingers are warm well perfused.  Left hip shows a well-healing surgical incision without complication.  No evidence of infection or cellulitis.  Calves are soft nontender.  She is neurovascular intact distally.  In regards to the left wrist, she will continue to be nonweightbearing.  We have reapplied a sugar tong splint today.  She will follow up with [place] in 2 weeks time for repeat evaluation and 3 view x-rays likely in the splint.  In regards to the hip, she may be weightbearing as tolerated.  Follow-up with [place] in 4 weeks for repeat evaluation and x-rays of the left femur.  Call with concerns or questions.  Follow-Up Instructions: Return in about 2 weeks (around 05/07/2020).   Orders:  Orders Placed This Encounter  Procedures  . XR HIP UNILAT W OR W/O PELVIS 2-3 VIEWS LEFT  . XR Wrist Complete Left   No orders of the defined types were placed in this encounter.   Imaging: No results found.  PMFS History: Patient Active Problem  List   Diagnosis Date Noted  . Urinary retention 04/15/2020  . Anemia, blood loss 04/15/2020  . Closed left hip fracture, initial encounter ([place]) 04/07/2020  . Unspecified fracture of the lower end of left radius, initial encounter for closed fracture 04/07/2020  . Diabetes mellitus type 2 in nonobese ([place]) 04/07/2020  . Dementia ([place]) 04/07/2020  . Fall as cause of accidental injury in residential institution as place of occurrence 04/07/2020  . Closed left hip fracture ([place]) 04/07/2020  . Frequent falls 02/25/2020  . HTN (hypertension) 02/04/2020  . Hypokalemia 01/21/2020  . Rectal bleed 01/18/2020  . Right lower lobe pneumonia 01/14/2020  . Depression with anxiety 12/31/2019  . Left tubo-ovarian mass 12/31/2019  . Slow transit constipation 12/31/2019  . Burst fracture of lumbar vertebra, sequela 12/27/2019  . Senile dementia, delirium ([place]) 04/03/2019  . Tachycardia 04/03/2019  . Osteopenia 06/14/2018  . Diabetes mellitus type 2 in obese ([place]) 11/09/2017  . Obesity (BMI 30-39.9) 01/19/2017  . Mixed hyperlipidemia 06/29/2016  . Gait abnormality 04/27/2016  . Hearing loss   . Hypothyroidism   . Insomnia    Past Medical History:  Diagnosis Date  . Hearing loss   . History of fracture of clavicle   . Hypothyroidism   . Insomnia   . Tinnitus     Family History  Problem Relation  Age of Onset  . Heart disease Mother   . Heart disease Father     Past Surgical History:  Procedure Laterality Date  . CHOLECYSTECTOMY  2007   Dr. Verdene Lennert  . CLAVICLE EXCISION  1986  . INTRAMEDULLARY (IM) NAIL INTERTROCHANTERIC Left 04/09/2020   Procedure: LEFT INTERTROCHANTERIC INTRAMEDULLARY (IM) NAIL;  Surgeon: Leandrew Koyanagi, MD;  Location: [facility];  Service: Orthopedics;  Laterality: Left;  . MOHS SURGERY     past 10 years chest & legs  . OPEN REDUCTION INTERNAL FIXATION (ORIF) DISTAL RADIAL FRACTURE Left 04/09/2020   Procedure: CLOSED REDUCTION LEFT DISTAL RADIUS FRACTURE;  Surgeon: Leandrew Koyanagi, MD;  Location: [facility];  Service: Orthopedics;  Laterality: Left;   Social History   Occupational History  . Occupation: retired Web designer  Tobacco Use  . Smoking status: Never Smoker  . Smokeless tobacco: Never Used  Vaping Use  . Vaping Use: Never used  Substance and Sexual Activity  . Alcohol use: Yes    Comment: 4 per week  . Drug use: No  . Sexual activity: Never

## 2020-04-29 ENCOUNTER — Encounter: Payer: Self-pay | Admitting: Nurse Practitioner

## 2020-04-29 ENCOUNTER — Non-Acute Institutional Stay (SKILLED_NURSING_FACILITY): Payer: Medicare Other | Admitting: Nurse Practitioner

## 2020-04-29 DIAGNOSIS — F01518 Vascular dementia, unspecified severity, with other behavioral disturbance: Secondary | ICD-10-CM

## 2020-04-29 DIAGNOSIS — D500 Iron deficiency anemia secondary to blood loss (chronic): Secondary | ICD-10-CM | POA: Diagnosis not present

## 2020-04-29 DIAGNOSIS — F0151 Vascular dementia with behavioral disturbance: Secondary | ICD-10-CM | POA: Diagnosis not present

## 2020-04-29 DIAGNOSIS — I10 Essential (primary) hypertension: Secondary | ICD-10-CM | POA: Diagnosis not present

## 2020-04-29 DIAGNOSIS — R339 Retention of urine, unspecified: Secondary | ICD-10-CM

## 2020-04-29 DIAGNOSIS — R000 Tachycardia, unspecified: Secondary | ICD-10-CM

## 2020-04-29 DIAGNOSIS — S72002D Fracture of unspecified part of neck of left femur, subsequent encounter for closed fracture with routine healing: Secondary | ICD-10-CM

## 2020-04-29 DIAGNOSIS — S52502A Unspecified fracture of the lower end of left radius, initial encounter for closed fracture: Secondary | ICD-10-CM

## 2020-04-29 DIAGNOSIS — E119 Type 2 diabetes mellitus without complications: Secondary | ICD-10-CM

## 2020-04-29 DIAGNOSIS — F418 Other specified anxiety disorders: Secondary | ICD-10-CM

## 2020-04-29 DIAGNOSIS — E039 Hypothyroidism, unspecified: Secondary | ICD-10-CM | POA: Diagnosis not present

## 2020-04-29 DIAGNOSIS — R296 Repeated falls: Secondary | ICD-10-CM

## 2020-04-29 DIAGNOSIS — E782 Mixed hyperlipidemia: Secondary | ICD-10-CM

## 2020-04-29 DIAGNOSIS — K5901 Slow transit constipation: Secondary | ICD-10-CM

## 2020-04-29 NOTE — Assessment & Plan Note (Signed)
takes Glimepiride, Hgb a1c 5.5 04/07/20

## 2020-04-29 NOTE — Assessment & Plan Note (Signed)
Impulsive, frailty, lack of safety awareness are contributory to her falling.

## 2020-04-29 NOTE — Assessment & Plan Note (Addendum)
Blood pressure is in control, takes Metoprolol, Losartan, Bun/creat 17/0.46 04/17/20, update CMP/eGFR

## 2020-04-29 NOTE — Assessment & Plan Note (Signed)
takes Atorvastatin. LDL 51 12/13/19 

## 2020-04-29 NOTE — Assessment & Plan Note (Signed)
sundowning.

## 2020-04-29 NOTE — Assessment & Plan Note (Signed)
L arm/the lower end of left radius, s/p closed reduction,  sling, hard cast, MWB

## 2020-04-29 NOTE — Assessment & Plan Note (Signed)
Occasional emotional outburst in setting of dementia with sundowning symptoms, takes Sertraline, Quetiapine

## 2020-04-29 NOTE — Assessment & Plan Note (Signed)
Urinary retention, resolved, off  Flomax

## 2020-04-29 NOTE — Assessment & Plan Note (Signed)
takes MiraLax prn, Colace bid 

## 2020-04-29 NOTE — Progress Notes (Addendum)
Location:    [facility] Room Number: 39 Place of Service:  SNF (31) Provider: Lennie Odor Dez Stauffer NP  [name], MD  Patient Care Team: [name], MD as PCP - General (Internal Medicine)  Extended Emergency Contact Information Primary Emergency Contact: Harris,Margaret Address: [place], [place] [address] [name] of [place] Phone: [phone] Mobile Phone: [phone] Relation: Daughter  Code Status:  DNR Goals of care: Advanced Directive information Advanced Directives 04/07/2020  Does Patient Have a Medical Advance Directive? No;Yes  Type of Advance Directive Out of facility DNR (pink MOST or yellow form);[place];Living will  Does patient want to make changes to medical advance directive? No - Patient declined  Copy of [place] in Chart? No - copy available, Physician notified  Would patient like information on creating a medical advance directive? No - Patient declined  Pre-existing out of facility DNR order (yellow form or pink MOST form) [place]/Yellow Form available - Physician notified to receive inpatient order     Chief Complaint  Patient presents with  . Medical Management of Chronic Issues    HPI:  Pt is a 87 y.o. female seen today for medical management of chronic diseases.    Closed left hip fracture, s/p ORIF 04/09/20,  f/u Ortho,  WBAT, prn Oxycodone, Methocarbamol prn, Tylenol tid for pain             L arm/the lower end of left radius, s/p closed reduction,  sling, hard cast, MWB             Urinary retention, resolved, off  Flomax             Anemia, Hgb 9.3 04/17/20             Constipation, takes MiraLax prn, Colace bid             T2DM, takes Glimepiride, Hgb a1c 5.5 04/07/20             Hyperlipidemia, takes Atorvastatin. LDL 51 12/13/19             HTN/tachycardia, takes Metoprolol, Losartan, Bun/creat 17/0.46 04/17/20             Hypothyroidism, takes  Levothyroxine, TSH 3.509 12/29/19             Frequent falls             Dementia, sundowning.              Depression/anxiety, takes Sertraline, Quetiapine    Past Medical History:  Diagnosis Date  . Hearing loss   . History of fracture of clavicle   . Hypothyroidism   . Insomnia   . Tinnitus    Past Surgical History:  Procedure Laterality Date  . CHOLECYSTECTOMY  2007   Dr. Verdene Lennert  . CLAVICLE EXCISION  1986  . INTRAMEDULLARY (IM) NAIL INTERTROCHANTERIC Left 04/09/2020   Procedure: LEFT INTERTROCHANTERIC INTRAMEDULLARY (IM) NAIL;  Surgeon: Leandrew Koyanagi, MD;  Location: [facility];  Service: Orthopedics;  Laterality: Left;  . MOHS SURGERY     past 10 years chest & legs  . OPEN REDUCTION INTERNAL FIXATION (ORIF) DISTAL RADIAL FRACTURE Left 04/09/2020   Procedure: CLOSED REDUCTION LEFT DISTAL RADIUS FRACTURE;  Surgeon: Leandrew Koyanagi, MD;  Location: [facility];  Service: Orthopedics;  Laterality: Left;    Allergies  Allergen Reactions  . Metformin And Related Diarrhea  Allergies as of 04/29/2020      Reactions   Metformin And Related Diarrhea      Medication List       Accurate as of April 29, 2020  3:43 PM. If you have any questions, ask your nurse or doctor.        STOP taking these medications   enoxaparin 40 MG/0.4ML injection Commonly known as: LOVENOX Stopped by: Jalene Demo X Nosson Wender, NP     TAKE these medications   acetaminophen 325 MG tablet Commonly known as: TYLENOL Take 650 mg by mouth in the morning, at noon, and at bedtime.   atorvastatin 10 MG tablet Commonly known as: LIPITOR Take 10 mg by mouth daily.   Blood Glucose Monitoring Suppl Kit Use to test blood sugar once daily daily. Dx: E11.69   Boost Glucose Control Liqd Take by mouth. 1 can three times a day between meals   docusate sodium 100 MG capsule Commonly known as: COLACE Take 1 capsule (100 mg total) by mouth 2 (two) times daily.   glimepiride 1 MG tablet Commonly known as: AMARYL Take 1 mg by mouth  daily with breakfast.   glucose blood test strip Use to test blood sugar once daily. Dx: E11.69   Lancets 30G Misc Use to test blood sugar once daily. Dx: E11.69   Lancets Misc. Kit 1 Device by Does not apply route daily. Use as directed   levothyroxine 75 MCG tablet Commonly known as: SYNTHROID TAKE 1 TABLET (75 MCG TOTAL) BY MOUTH DAILY BEFORE BREAKFAST.   losartan 25 MG tablet Commonly known as: COZAAR Take 25 mg by mouth daily.   methocarbamol 500 MG tablet Commonly known as: ROBAXIN Take 250 mg by mouth every 8 (eight) hours as needed for muscle spasms. What changed: Another medication with the same name was removed. Continue taking this medication, and follow the directions you see here. Changed by: Pamala Hayman X Adison Jerger, NP   metoprolol tartrate 25 MG tablet Commonly known as: LOPRESSOR Take 12.5 mg by mouth 2 (two) times daily.   oxyCODONE 5 MG immediate release tablet Commonly known as: Oxy IR/ROXICODONE Take 1 tablet (5 mg total) by mouth every 6 (six) hours as needed for moderate pain or severe pain.   polyethylene glycol 17 g packet Commonly known as: MIRALAX / GLYCOLAX Take 17 g by mouth daily as needed for moderate constipation.   QUEtiapine 25 MG tablet Commonly known as: SEROQUEL Take 12.5 mg by mouth at bedtime.   sertraline 25 MG tablet Commonly known as: ZOLOFT Take 50 mg by mouth daily.   Vitamin D3 50 MCG (2000 UT) Tabs Take 1 tablet by mouth daily.   zinc oxide 20 % ointment Apply 1 application topically as needed for irritation.       Review of Systems  Constitutional: Negative for fatigue, fever and unexpected weight change.  HENT: Positive for hearing loss. Negative for congestion and voice change.   Eyes: Negative for visual disturbance.  Respiratory: Negative for shortness of breath.   Cardiovascular: Positive for leg swelling.  Gastrointestinal: Negative for abdominal pain and constipation.  Genitourinary: Negative for difficulty  urinating, dysuria and urgency.  Musculoskeletal: Positive for arthralgias, back pain and gait problem.  Skin: Negative for color change.  Neurological: Negative for speech difficulty, weakness and light-headedness.       Confused.   Psychiatric/Behavioral: Positive for behavioral problems and confusion. Negative for sleep disturbance. The patient is not nervous/anxious.     Immunization History  Administered Date(s) Administered  .  DTaP 07/20/2013  . Influenza, High Dose Seasonal PF 12/07/2016  . Influenza,inj,Quad PF,6+ Mos 12/01/2017  . Influenza-Unspecified 11/20/2014, 12/12/2015, 11/20/2019  . Moderna Sars-Covid-2 Vaccination 03/05/2019, 04/02/2019  . PPD Test 08/06/2014  . Pneumococcal Conjugate-13 11/07/2013  . Pneumococcal Polysaccharide-23 03/22/2017  . Zoster 03/01/2006  . Zoster Recombinat (Shingrix) 06/07/2017, 08/26/2017   Pertinent  Health Maintenance Due  Topic Date Due  . OPHTHALMOLOGY EXAM  Never done  . FOOT EXAM  07/10/2020  . HEMOGLOBIN A1C  10/05/2020  . INFLUENZA VACCINE  Completed  . DEXA SCAN  Completed  . PNA vac Low Risk Adult  Completed   Fall Risk  12/19/2019 10/17/2019 08/22/2019 07/11/2019 06/13/2019  Falls in the past year? 0 0 0 0 0  Number falls in past yr: 0 0 0 0 0  Injury with Fall? - - 0 - -   Functional Status Survey:    Vitals:   04/29/20 1502  BP: (!) 141/76  Pulse: 92  Resp: 20  Temp: (!) 97.2 F (36.2 C)  SpO2: 96%  Weight: 137 lb 9.6 oz (62.4 kg)  Height: 5' (1.524 m)   Body mass index is 26.87 kg/m. Physical Exam Vitals and nursing note reviewed.  Constitutional:      Appearance: Normal appearance.  HENT:     Head: Normocephalic and atraumatic.     Mouth/Throat:     Mouth: Mucous membranes are moist.  Eyes:     Extraocular Movements: Extraocular movements intact.     Conjunctiva/sclera: Conjunctivae normal.     Pupils: Pupils are equal, round, and reactive to light.  Cardiovascular:     Rate and Rhythm: Normal  rate and regular rhythm.     Heart sounds: No murmur heard.   Pulmonary:     Effort: Pulmonary effort is normal.     Breath sounds: No rales.  Abdominal:     General: Bowel sounds are normal.     Palpations: Abdomen is soft.     Tenderness: There is no abdominal tenderness.  Musculoskeletal:        General: Tenderness present.     Cervical back: Normal range of motion and neck supple.     Right lower leg: No edema.     Left lower leg: No edema.     Comments: Left wrist in sugar tongue splint, non weight bearing. the eft hip pain is minimal.  Mild swelling left wrist  Skin:    General: Skin is warm and dry.     Comments: Left wrist in case/sling. Left hips surgical scar  Neurological:     General: No focal deficit present.     Mental Status: She is alert. Mental status is at baseline.     Motor: No weakness.     Coordination: Coordination normal.     Gait: Gait abnormal.     Comments: Oriented to person, followed simple directions.   Psychiatric:        Mood and Affect: Mood normal.        Behavior: Behavior normal.     Labs reviewed: Recent Labs    04/10/20 0155 04/11/20 0136 04/14/20 0354  NA 138 141 142  K 3.9 3.5 3.4*  CL 104 108 103  CO2 '23 23 25  ' GLUCOSE 150* 106* 101*  BUN '12 16 17  ' CREATININE 0.62 0.57 0.61  CALCIUM 8.5* 8.0* 8.3*   Recent Labs    08/16/19 0800 12/13/19 0000 01/10/20 0000  AST '14 15 18  ' ALT 14 11 17  ALKPHOS  --   --  124  BILITOT 0.4 0.4  --   PROT 6.4 6.5  --   ALBUMIN  --   --  3.5   Recent Labs    01/10/20 0000 01/21/20 0000 01/28/20 0000 02/07/20 0000 04/07/20 1921 04/08/20 0410 04/11/20 0136 04/12/20 0200 04/12/20 1841 04/14/20 0354  WBC 14.3   < > 7.2   < > 14.1*   < > 10.2 9.0  --  11.0*  NEUTROABS 10,082.00  --  4,673.00  --  10.0*  --   --   --   --   --   HGB 13.6   < > 12.0   < > 11.3*   < > 8.1* 7.5* 9.1* 9.4*  HCT 42   < > 37   < > 35.1*   < > 26.2* 23.4* 27.0* 30.9*  MCV  --   --   --   --  82.2   < >  84.8 83.9  --  85.8  PLT 523*   < > 282   < > 341   < > 276 270  --  317   < > = values in this interval not displayed.   Lab Results  Component Value Date   TSH 3.509 12/29/2019   Lab Results  Component Value Date   HGBA1C 5.5 04/07/2020   Lab Results  Component Value Date   CHOL 109 12/13/2019   HDL 36 (L) 12/13/2019   LDLCALC 51 12/13/2019   TRIG 132 12/13/2019   CHOLHDL 3.0 12/13/2019    Significant Diagnostic Results in last 30 days:  DG Chest 1 View  Result Date: 04/07/2020 CLINICAL DATA:  Found down, dementia EXAM: CHEST  1 VIEW COMPARISON:  None. FINDINGS: Single frontal view of the chest demonstrates an unremarkable cardiac silhouette. No airspace disease, effusion, or pneumothorax. No acute bony abnormalities. IMPRESSION: 1. No acute intrathoracic process. Electronically Signed   By: Randa Ngo M.D.   On: 04/07/2020 20:24   DG Wrist Complete Left  Result Date: 04/09/2020 CLINICAL DATA:  Close reduction left wrist fracture EXAM: LEFT WRIST - COMPLETE 3+ VIEW COMPARISON:  04/07/2020 FINDINGS: Two fluoroscopic images are obtained during the performance of the procedure and are provided for interpretation only. There has been interval reduction of the comminuted distal left radial fracture seen previously, now with near anatomic alignment. Please refer to the operative report. FLUOROSCOPY TIME:  8 seconds IMPRESSION: 1. Reduction of distal left radial fracture with near anatomic alignment. Electronically Signed   By: Randa Ngo M.D.   On: 04/09/2020 18:07   DG Wrist Complete Left  Result Date: 04/07/2020 CLINICAL DATA:  Post reduction film of known left wrist fracture. EXAM: LEFT WRIST - COMPLETE 3+ VIEW COMPARISON:  None. FINDINGS: Splinting material obscures fine bony and soft tissue detail. Interval splinting with similar alignment of the displaced, impacted dorsally angulated fracture through the distal radius. IMPRESSION: Interval splinting with similar alignment of  the distal radial fracture. Electronically Signed   By: Dahlia Bailiff MD   On: 04/07/2020 23:11   DG Wrist Complete Left  Result Date: 04/07/2020 CLINICAL DATA:  Pain EXAM: LEFT WRIST - COMPLETE 3+ VIEW COMPARISON:  None. FINDINGS: There is an acute, displaced and impacted fracture through the distal radius with significant dorsal angulation. There is surrounding soft tissue swelling. No frank dislocation. There is an old healed fracture of the fourth metacarpal. There are degenerative changes of the partially visualized interphalangeal joints. IMPRESSION:  Acute, displaced, impacted fracture through the distal radius with significant dorsal angulation. Electronically Signed   By: Constance Holster M.D.   On: 04/07/2020 20:24   DG Ankle Complete Left  Result Date: 04/07/2020 CLINICAL DATA:  Pain EXAM: LEFT ANKLE COMPLETE - 3+ VIEW COMPARISON:  None. FINDINGS: There is no evidence of fracture, dislocation, or joint effusion. There is no evidence of arthropathy or other focal bone abnormality. Soft tissues are unremarkable. IMPRESSION: Negative. Electronically Signed   By: Constance Holster M.D.   On: 04/07/2020 20:25   CT HEAD WO CONTRAST  Result Date: 04/07/2020 CLINICAL DATA:  Facial trauma EXAM: CT HEAD WITHOUT CONTRAST TECHNIQUE: Contiguous axial images were obtained from the base of the skull through the vertex without intravenous contrast. COMPARISON:  None. FINDINGS: Brain: No evidence of acute territorial infarction, hemorrhage, hydrocephalus,extra-axial collection or mass lesion/mass effect. There is dilatation the ventricles and sulci consistent with age-related atrophy. Low-attenuation changes in the deep white matter consistent with small vessel ischemia. Vascular: No hyperdense vessel or unexpected calcification. Skull: The skull is intact. No fracture or focal lesion identified. Mild soft tissue swelling seen over the left frontal skull. Sinuses/Orbits: The visualized paranasal sinuses and  mastoid air cells are clear. The orbits and globes intact. Other: None Cervical spine: Alignment: Physiologic Skull base and vertebrae: Visualized skull base is intact. No atlanto-occipital dissociation. The vertebral body heights are well maintained. No fracture or pathologic osseous lesion seen. Soft tissues and spinal canal: The visualized paraspinal soft tissues are unremarkable. No prevertebral soft tissue swelling is seen. The spinal canal is grossly unremarkable, no large epidural collection or significant canal narrowing. Disc levels: Multilevel cervical spine spondylosis seen with disc osteophyte complex and uncovertebral osteophytes most notable at C4-C5 and C5-C6. Upper chest: The lung apices are clear. Thoracic inlet is within normal limits. Other: None IMPRESSION: No acute intracranial abnormality. Findings consistent with age related atrophy and chronic small vessel ischemia No acute fracture or malalignment of the spine. Electronically Signed   By: Prudencio Pair M.D.   On: 04/07/2020 20:43   CT CERVICAL SPINE WO CONTRAST  Result Date: 04/07/2020 CLINICAL DATA:  Facial trauma EXAM: CT HEAD WITHOUT CONTRAST TECHNIQUE: Contiguous axial images were obtained from the base of the skull through the vertex without intravenous contrast. COMPARISON:  None. FINDINGS: Brain: No evidence of acute territorial infarction, hemorrhage, hydrocephalus,extra-axial collection or mass lesion/mass effect. There is dilatation the ventricles and sulci consistent with age-related atrophy. Low-attenuation changes in the deep white matter consistent with small vessel ischemia. Vascular: No hyperdense vessel or unexpected calcification. Skull: The skull is intact. No fracture or focal lesion identified. Mild soft tissue swelling seen over the left frontal skull. Sinuses/Orbits: The visualized paranasal sinuses and mastoid air cells are clear. The orbits and globes intact. Other: None Cervical spine: Alignment: Physiologic Skull  base and vertebrae: Visualized skull base is intact. No atlanto-occipital dissociation. The vertebral body heights are well maintained. No fracture or pathologic osseous lesion seen. Soft tissues and spinal canal: The visualized paraspinal soft tissues are unremarkable. No prevertebral soft tissue swelling is seen. The spinal canal is grossly unremarkable, no large epidural collection or significant canal narrowing. Disc levels: Multilevel cervical spine spondylosis seen with disc osteophyte complex and uncovertebral osteophytes most notable at C4-C5 and C5-C6. Upper chest: The lung apices are clear. Thoracic inlet is within normal limits. Other: None IMPRESSION: No acute intracranial abnormality. Findings consistent with age related atrophy and chronic small vessel ischemia No acute fracture or malalignment  of the spine. Electronically Signed   By: Prudencio Pair M.D.   On: 04/07/2020 20:43   DG Shoulder Right Port  Result Date: 04/13/2020 CLINICAL DATA:  Right shoulder pain. EXAM: PORTABLE RIGHT SHOULDER COMPARISON:  None. FINDINGS: No acute fracture or dislocation. Small well corticated ossific density near the superior glenoid, potentially related to old trauma versus small intra-articular body. Old healed fracture of the right mid clavicle. Small humeral head marginal osteophytes. Joint spaces are preserved. Soft tissues are unremarkable. IMPRESSION: 1. Early glenohumeral osteoarthritis. No acute osseous abnormality. Electronically Signed   By: Titus Dubin M.D.   On: 04/13/2020 12:01   DG C-Arm 1-60 Min  Result Date: 04/09/2020 CLINICAL DATA:  ORIF left femur. EXAM: DG C-ARM 1-60 MIN; LEFT FEMUR 2 VIEWS FLUOROSCOPY TIME:  Fluoroscopy Time:  1 minutes 41 seconds Reported radiation: 17 mGy. COMPARISON:  April 07, 2020 FINDINGS: Four C-arm fluoroscopic images were obtained intraoperatively and submitted for post operative interpretation. These images demonstrate fixation of an inter trochanteric  fracture of the left femur with intramedullary nail and screws. Improved, near anatomic alignment. No unexpected findings. Please see the performing provider's procedural report for further detail. IMPRESSION: Intraoperative fluoroscopic imaging, as detailed above. Electronically Signed   By: Margaretha Sheffield MD   On: 04/09/2020 17:10   DG Hip Unilat With Pelvis 2-3 Views Left  Result Date: 04/07/2020 CLINICAL DATA:  Pain EXAM: DG HIP (WITH OR WITHOUT PELVIS) 2-3V LEFT COMPARISON:  None. FINDINGS: There is an acute, comminuted and displaced intratrochanteric fracture of the proximal left femur. There is no dislocation. There is osteopenia. There are moderate degenerative changes of both hips. IMPRESSION: Acute, comminuted and displaced intratrochanteric fracture of the proximal left femur. Electronically Signed   By: Constance Holster M.D.   On: 04/07/2020 20:24   DG FEMUR MIN 2 VIEWS LEFT  Result Date: 04/09/2020 CLINICAL DATA:  ORIF left femur. EXAM: DG C-ARM 1-60 MIN; LEFT FEMUR 2 VIEWS FLUOROSCOPY TIME:  Fluoroscopy Time:  1 minutes 41 seconds Reported radiation: 17 mGy. COMPARISON:  April 07, 2020 FINDINGS: Four C-arm fluoroscopic images were obtained intraoperatively and submitted for post operative interpretation. These images demonstrate fixation of an inter trochanteric fracture of the left femur with intramedullary nail and screws. Improved, near anatomic alignment. No unexpected findings. Please see the performing provider's procedural report for further detail. IMPRESSION: Intraoperative fluoroscopic imaging, as detailed above. Electronically Signed   By: Margaretha Sheffield MD   On: 04/09/2020 17:10   XR HIP UNILAT W OR W/O PELVIS 2-3 VIEWS LEFT  Result Date: 04/23/2020 X-rays reveal stable fracture without hardware complication  XR Wrist Complete Left  Result Date: 04/23/2020 X-rays show stable fixation of the fracture with minimal interval change   Assessment/Plan Anemia, blood  loss  Hgb 9.4 04/14/20, update CBC/diff.    Slow transit constipation takes MiraLax prn, Colace bid   Diabetes mellitus type 2 in nonobese (HCC)  takes Glimepiride, Hgb a1c 5.5 04/07/20   Mixed hyperlipidemia  takes Atorvastatin. LDL 51 12/13/19   Tachycardia Heart rate is in control,  takes Metoprolol, Losartan, Bun/creat 17/0.46 04/17/20   HTN (hypertension) Blood pressure is in control, takes Metoprolol, Losartan, Bun/creat 17/0.46 04/17/20, update CMP/eGFR   Hypothyroidism  takes Levothyroxine, TSH 3.509 12/29/19   Frequent falls Impulsive, frailty, lack of safety awareness are contributory to her falling.   Dementia with behavioral disturbance (HCC)  sundowning.  Depression with anxiety Occasional emotional outburst in setting of dementia with sundowning symptoms, takes Sertraline, Quetiapine  Urinary retention Urinary retention, resolved, off  Flomax   Unspecified fracture of the lower end of left radius, initial encounter for closed fracture L arm/the lower end of left radius, s/p closed reduction,  sling, hard cast, MWB   Closed left hip fracture (HCC) Closed left hip fracture, s/p ORIF 04/09/20,  f/u Ortho,  WBAT, prn Oxycodone, Methocarbamol prn, Tylenol tid for pain    Family/ staff Communication: plan of care reviewed with the patient and charge nurse.   Labs/tests ordered: CBC/diff, CMP/eGFR  Time spend 35 minutes.

## 2020-04-29 NOTE — Assessment & Plan Note (Signed)
Heart rate is in control,  takes Metoprolol, Losartan, Bun/creat 17/0.46 04/17/20

## 2020-04-29 NOTE — Assessment & Plan Note (Signed)
Closed left hip fracture, s/p ORIF 04/09/20,  f/u Ortho,  WBAT, prn Oxycodone, Methocarbamol prn, Tylenol tid for pain

## 2020-04-29 NOTE — Assessment & Plan Note (Signed)
takes Levothyroxine, TSH 3.509 12/29/19 

## 2020-04-29 NOTE — Assessment & Plan Note (Signed)
Hgb 9.4 04/14/20, update CBC/diff.

## 2020-05-01 ENCOUNTER — Non-Acute Institutional Stay (SKILLED_NURSING_FACILITY): Payer: Medicare Other | Admitting: Internal Medicine

## 2020-05-01 ENCOUNTER — Encounter: Payer: Self-pay | Admitting: Internal Medicine

## 2020-05-01 DIAGNOSIS — R296 Repeated falls: Secondary | ICD-10-CM

## 2020-05-01 DIAGNOSIS — E119 Type 2 diabetes mellitus without complications: Secondary | ICD-10-CM | POA: Diagnosis not present

## 2020-05-01 DIAGNOSIS — E782 Mixed hyperlipidemia: Secondary | ICD-10-CM | POA: Diagnosis not present

## 2020-05-01 DIAGNOSIS — R000 Tachycardia, unspecified: Secondary | ICD-10-CM | POA: Diagnosis not present

## 2020-05-01 DIAGNOSIS — D500 Iron deficiency anemia secondary to blood loss (chronic): Secondary | ICD-10-CM

## 2020-05-01 DIAGNOSIS — F01518 Vascular dementia, unspecified severity, with other behavioral disturbance: Secondary | ICD-10-CM

## 2020-05-01 DIAGNOSIS — E039 Hypothyroidism, unspecified: Secondary | ICD-10-CM | POA: Diagnosis not present

## 2020-05-01 DIAGNOSIS — I10 Essential (primary) hypertension: Secondary | ICD-10-CM

## 2020-05-01 DIAGNOSIS — F0151 Vascular dementia with behavioral disturbance: Secondary | ICD-10-CM

## 2020-05-01 DIAGNOSIS — S72002D Fracture of unspecified part of neck of left femur, subsequent encounter for closed fracture with routine healing: Secondary | ICD-10-CM

## 2020-05-01 LAB — COMPREHENSIVE METABOLIC PANEL
Albumin: 3.5 (ref 3.5–5.0)
Calcium: 9 (ref 8.7–10.7)
Globulin: 2.5

## 2020-05-01 LAB — BASIC METABOLIC PANEL
BUN: 23 — AB (ref 4–21)
CO2: 26 — AB (ref 13–22)
Chloride: 106 (ref 99–108)
Creatinine: 0.5 (ref 0.5–1.1)
Glucose: 96
Potassium: 4.2 (ref 3.4–5.3)
Sodium: 141 (ref 137–147)

## 2020-05-01 LAB — CBC AND DIFFERENTIAL
HCT: 35 — AB (ref 36–46)
Hemoglobin: 11.2 — AB (ref 12.0–16.0)
Neutrophils Absolute: 4471
Platelets: 322 (ref 150–399)
WBC: 7.9

## 2020-05-01 LAB — CBC: RBC: 4.25 (ref 3.87–5.11)

## 2020-05-01 LAB — HEPATIC FUNCTION PANEL
ALT: 11 (ref 7–35)
AST: 13 (ref 13–35)
Alkaline Phosphatase: 127 — AB (ref 25–125)
Bilirubin, Total: 0.3

## 2020-05-01 NOTE — Progress Notes (Signed)
Location:    [facility] Room Number: 39 Place of Service:  SNF [phone]) Provider:  Veleta Miners MD  [name], MD  Patient Care Team: [name], MD as PCP - General (Internal Medicine)  Extended Emergency Contact Information Primary Emergency Contact: Harris,Margaret Address: [address]          [place], [place] [address] [name] of [place] Phone: [phone] Mobile Phone: [phone] Relation: Daughter  Code Status:  DNR Goals of care: Advanced Directive information Advanced Directives 04/07/2020  Does Patient Have a Medical Advance Directive? No;Yes  Type of Advance Directive Out of facility DNR (pink MOST or yellow form);[place];Living will  Does patient want to make changes to medical advance directive? No - Patient declined  Copy of [place] in Chart? No - copy available, Physician notified  Would patient like information on creating a medical advance directive? No - Patient declined  Pre-existing out of facility DNR order (yellow form or pink MOST form) [place]/Yellow Form available - Physician notified to receive inpatient order     Chief Complaint  Patient presents with  . Acute Visit    Follow up    HPI:  Pt is a 87 y.o. female seen today for an acute visit for Follow up per daughters request  Admitted to hospital from 2/7-2/14 for left hip fracture s/p ORIF on 2/9 and fracture of lower end of radius s/p reduction Patient has a history of type 2 diabetes, hyperlipidemia, depression, dementia, hypothyroidism, osteopenia, stress and urge urinary incontinence, Tachycardia Patient was admitted in the hospital from 10/27-10/31 with L4 burst fracture after the fall  Is in SNF for therapy Daughter wanted to discuss her behaviors.  Patient was on Depakote which she was taken off in the hospital.  Daughter wants to reconsider as she has noticed some issues mostly during evening  time. Patient continues to try to get up from her wheelchair.  Continues to be high risk for falls also noticed some swelling in her hands when the cast is.  The hand is very sensitive but no pain or redness.  The hand is warm.  And is she is able to move her fingers      Past Medical History:  Diagnosis Date  . Hearing loss   . History of fracture of clavicle   . Hypothyroidism   . Insomnia   . Tinnitus    Past Surgical History:  Procedure Laterality Date  . CHOLECYSTECTOMY  2007   Dr. Verdene Lennert  . CLAVICLE EXCISION  1986  . INTRAMEDULLARY (IM) NAIL INTERTROCHANTERIC Left 04/09/2020   Procedure: LEFT INTERTROCHANTERIC INTRAMEDULLARY (IM) NAIL;  Surgeon: Leandrew Koyanagi, MD;  Location: [facility];  Service: Orthopedics;  Laterality: Left;  . MOHS SURGERY     past 10 years chest & legs  . OPEN REDUCTION INTERNAL FIXATION (ORIF) DISTAL RADIAL FRACTURE Left 04/09/2020   Procedure: CLOSED REDUCTION LEFT DISTAL RADIUS FRACTURE;  Surgeon: Leandrew Koyanagi, MD;  Location: [facility];  Service: Orthopedics;  Laterality: Left;    Allergies  Allergen Reactions  . Metformin And Related Diarrhea    Allergies as of 05/01/2020      Reactions   Metformin And Related Diarrhea      Medication List       Accurate as of May 01, 2020  2:51 PM. If you have any questions, ask your nurse or doctor.        acetaminophen 325  MG tablet Commonly known as: TYLENOL Take 650 mg by mouth in the morning, at noon, and at bedtime.   atorvastatin 10 MG tablet Commonly known as: LIPITOR Take 10 mg by mouth daily.   Blood Glucose Monitoring Suppl Kit Use to test blood sugar once daily daily. Dx: E11.69   Boost Glucose Control Liqd Take by mouth. 1 can three times a day between meals   docusate sodium 100 MG capsule Commonly known as: COLACE Take 1 capsule (100 mg total) by mouth 2 (two) times daily.   glimepiride 1 MG tablet Commonly known as: AMARYL Take 1 mg by mouth daily with breakfast.   glucose blood  test strip Use to test blood sugar once daily. Dx: E11.69   Lancets 30G Misc Use to test blood sugar once daily. Dx: E11.69   Lancets Misc. Kit 1 Device by Does not apply route daily. Use as directed   levothyroxine 75 MCG tablet Commonly known as: SYNTHROID TAKE 1 TABLET (75 MCG TOTAL) BY MOUTH DAILY BEFORE BREAKFAST.   losartan 25 MG tablet Commonly known as: COZAAR Take 25 mg by mouth daily.   methocarbamol 500 MG tablet Commonly known as: ROBAXIN Take 250 mg by mouth every 8 (eight) hours as needed for muscle spasms.   metoprolol tartrate 25 MG tablet Commonly known as: LOPRESSOR Take 12.5 mg by mouth 2 (two) times daily.   oxyCODONE 5 MG immediate release tablet Commonly known as: Oxy IR/ROXICODONE Take 1 tablet (5 mg total) by mouth every 6 (six) hours as needed for moderate pain or severe pain.   polyethylene glycol 17 g packet Commonly known as: MIRALAX / GLYCOLAX Take 17 g by mouth daily as needed for moderate constipation.   QUEtiapine 25 MG tablet Commonly known as: SEROQUEL Take 12.5 mg by mouth at bedtime.   sertraline 25 MG tablet Commonly known as: ZOLOFT Take 50 mg by mouth daily.   Vitamin D3 50 MCG (2000 UT) Tabs Take 1 tablet by mouth daily.   zinc oxide 20 % ointment Apply 1 application topically as needed for irritation.       Review of Systems  Constitutional: Positive for activity change.  HENT: Negative.   Respiratory: Negative.   Cardiovascular: Negative.   Gastrointestinal: Negative.   Genitourinary: Negative.   Musculoskeletal: Positive for arthralgias, gait problem and myalgias.  Skin: Negative.   Neurological: Positive for weakness.  Psychiatric/Behavioral: Positive for behavioral problems and confusion.    Immunization History  Administered Date(s) Administered  . DTaP 07/20/2013  . Influenza, High Dose Seasonal PF 12/07/2016  . Influenza,inj,Quad PF,6+ Mos 12/01/2017  . Influenza-Unspecified 11/20/2014, 12/12/2015,  11/20/2019  . Moderna Sars-Covid-2 Vaccination 03/05/2019, 04/02/2019  . PPD Test 08/06/2014  . Pneumococcal Conjugate-13 11/07/2013  . Pneumococcal Polysaccharide-23 03/22/2017  . Zoster 03/01/2006  . Zoster Recombinat (Shingrix) 06/07/2017, 08/26/2017   Pertinent  Health Maintenance Due  Topic Date Due  . OPHTHALMOLOGY EXAM  Never done  . FOOT EXAM  07/10/2020  . HEMOGLOBIN A1C  10/05/2020  . INFLUENZA VACCINE  Completed  . DEXA SCAN  Completed  . PNA vac Low Risk Adult  Completed   Fall Risk  12/19/2019 10/17/2019 08/22/2019 07/11/2019 06/13/2019  Falls in the past year? 0 0 0 0 0  Number falls in past yr: 0 0 0 0 0  Injury with Fall? - - 0 - -   Functional Status Survey:    Vitals:   05/01/20 1428  BP: 128/67  Pulse: 99  Resp: 20  Temp: 97.8 F (36.6 C)  SpO2: 93%  Weight: 137 lb 9.6 oz (62.4 kg)  Height: 5' (1.524 m)   Body mass index is 26.87 kg/m. Physical Exam Vitals reviewed.  Constitutional:      Appearance: Normal appearance.  HENT:     Head: Normocephalic.     Nose: Nose normal.     Mouth/Throat:     Mouth: Mucous membranes are moist.     Pharynx: Oropharynx is clear.  Eyes:     Pupils: Pupils are equal, round, and reactive to light.  Cardiovascular:     Rate and Rhythm: Normal rate and regular rhythm.     Pulses: Normal pulses.  Pulmonary:     Effort: Pulmonary effort is normal.     Breath sounds: Normal breath sounds.  Abdominal:     General: Abdomen is flat. Bowel sounds are normal.     Palpations: Abdomen is soft.  Musculoskeletal:     Cervical back: Neck supple.     Comments: Left hand some swelling where the Cast is. Not red or tender. Able to move her Fingers  Skin:    General: Skin is warm.  Neurological:     General: No focal deficit present.     Mental Status: She is alert.  Psychiatric:        Mood and Affect: Mood normal.        Thought Content: Thought content normal.     Labs reviewed: Recent Labs    04/10/20 0155  04/11/20 0136 04/14/20 0354 04/17/20 0000  NA 138 141 142 142  K 3.9 3.5 3.4* 3.7  CL 104 108 103 107  CO2 _0 26*  GLUCOSE 150* 106* 101*  --   BUN _1 CREATININE 0.62 0.57 0.61 0.5  CALCIUM 8.5* 8.0* 8.3* 8.0*   Recent Labs    08/16/19 0800 12/13/19 0000 01/10/20 0000 04/17/20 0000  AST _2 ALT _3 ALKPHOS  --   --  124 74  BILITOT 0.4 0.4  --   --   PROT 6.4 6.5  --   --   ALBUMIN  --   --  3.5 2.6*   Recent Labs    01/10/20 0000 01/21/20 0000 01/28/20 0000 02/07/20 0000 04/07/20 1921 04/08/20 0410 04/11/20 0136 04/12/20 0200 04/12/20 1841 04/14/20 0354  WBC 14.3   < > 7.2   < > 14.1*   < > 10.2 9.0  --  11.0*  NEUTROABS 10,082.00  --  4,673.00  --  10.0*  --   --   --   --   --   HGB 13.6   < > 12.0   < > 11.3*   < > 8.1* 7.5* 9.1* 9.4*  HCT 42   < > 37   < > 35.1*   < > 26.2* 23.4* 27.0* 30.9*  MCV  --   --   --   --  82.2   < > 84.8 83.9  --  85.8  PLT 523*   < > 282   < > 341   < > 276 270  --  317   < > = values in this interval not displayed.   Lab Results  Component Value Date   TSH 3.509 12/29/2019   Lab Results  Component Value Date   HGBA1C 5.5 04/07/2020   Lab Results  Component Value Date   CHOL 95 04/17/2020  HDL 22 (A) 04/17/2020   LDLCALC 50 04/17/2020   TRIG 150 04/17/2020   CHOLHDL 3.0 12/13/2019    Significant Diagnostic Results in last 30 days:  DG Chest 1 View  Result Date: 04/07/2020 CLINICAL DATA:  Found down, dementia EXAM: CHEST  1 VIEW COMPARISON:  None. FINDINGS: Single frontal view of the chest demonstrates an unremarkable cardiac silhouette. No airspace disease, effusion, or pneumothorax. No acute bony abnormalities. IMPRESSION: 1. No acute intrathoracic process. Electronically Signed   By: Randa Ngo M.D.   On: 04/07/2020 20:24   DG Wrist Complete Left  Result Date: 04/09/2020 CLINICAL DATA:  Close reduction left wrist fracture EXAM: LEFT WRIST - COMPLETE 3+ VIEW COMPARISON:   04/07/2020 FINDINGS: Two fluoroscopic images are obtained during the performance of the procedure and are provided for interpretation only. There has been interval reduction of the comminuted distal left radial fracture seen previously, now with near anatomic alignment. Please refer to the operative report. FLUOROSCOPY TIME:  8 seconds IMPRESSION: 1. Reduction of distal left radial fracture with near anatomic alignment. Electronically Signed   By: Randa Ngo M.D.   On: 04/09/2020 18:07   DG Wrist Complete Left  Result Date: 04/07/2020 CLINICAL DATA:  Post reduction film of known left wrist fracture. EXAM: LEFT WRIST - COMPLETE 3+ VIEW COMPARISON:  None. FINDINGS: Splinting material obscures fine bony and soft tissue detail. Interval splinting with similar alignment of the displaced, impacted dorsally angulated fracture through the distal radius. IMPRESSION: Interval splinting with similar alignment of the distal radial fracture. Electronically Signed   By: Dahlia Bailiff MD   On: 04/07/2020 23:11   DG Wrist Complete Left  Result Date: 04/07/2020 CLINICAL DATA:  Pain EXAM: LEFT WRIST - COMPLETE 3+ VIEW COMPARISON:  None. FINDINGS: There is an acute, displaced and impacted fracture through the distal radius with significant dorsal angulation. There is surrounding soft tissue swelling. No frank dislocation. There is an old healed fracture of the fourth metacarpal. There are degenerative changes of the partially visualized interphalangeal joints. IMPRESSION: Acute, displaced, impacted fracture through the distal radius with significant dorsal angulation. Electronically Signed   By: Constance Holster M.D.   On: 04/07/2020 20:24   DG Ankle Complete Left  Result Date: 04/07/2020 CLINICAL DATA:  Pain EXAM: LEFT ANKLE COMPLETE - 3+ VIEW COMPARISON:  None. FINDINGS: There is no evidence of fracture, dislocation, or joint effusion. There is no evidence of arthropathy or other focal bone abnormality. Soft tissues  are unremarkable. IMPRESSION: Negative. Electronically Signed   By: Constance Holster M.D.   On: 04/07/2020 20:25   CT HEAD WO CONTRAST  Result Date: 04/07/2020 CLINICAL DATA:  Facial trauma EXAM: CT HEAD WITHOUT CONTRAST TECHNIQUE: Contiguous axial images were obtained from the base of the skull through the vertex without intravenous contrast. COMPARISON:  None. FINDINGS: Brain: No evidence of acute territorial infarction, hemorrhage, hydrocephalus,extra-axial collection or mass lesion/mass effect. There is dilatation the ventricles and sulci consistent with age-related atrophy. Low-attenuation changes in the deep white matter consistent with small vessel ischemia. Vascular: No hyperdense vessel or unexpected calcification. Skull: The skull is intact. No fracture or focal lesion identified. Mild soft tissue swelling seen over the left frontal skull. Sinuses/Orbits: The visualized paranasal sinuses and mastoid air cells are clear. The orbits and globes intact. Other: None Cervical spine: Alignment: Physiologic Skull base and vertebrae: Visualized skull base is intact. No atlanto-occipital dissociation. The vertebral body heights are well maintained. No fracture or pathologic osseous lesion seen. Soft tissues  and spinal canal: The visualized paraspinal soft tissues are unremarkable. No prevertebral soft tissue swelling is seen. The spinal canal is grossly unremarkable, no large epidural collection or significant canal narrowing. Disc levels: Multilevel cervical spine spondylosis seen with disc osteophyte complex and uncovertebral osteophytes most notable at C4-C5 and C5-C6. Upper chest: The lung apices are clear. Thoracic inlet is within normal limits. Other: None IMPRESSION: No acute intracranial abnormality. Findings consistent with age related atrophy and chronic small vessel ischemia No acute fracture or malalignment of the spine. Electronically Signed   By: Prudencio Pair M.D.   On: 04/07/2020 20:43   CT  CERVICAL SPINE WO CONTRAST  Result Date: 04/07/2020 CLINICAL DATA:  Facial trauma EXAM: CT HEAD WITHOUT CONTRAST TECHNIQUE: Contiguous axial images were obtained from the base of the skull through the vertex without intravenous contrast. COMPARISON:  None. FINDINGS: Brain: No evidence of acute territorial infarction, hemorrhage, hydrocephalus,extra-axial collection or mass lesion/mass effect. There is dilatation the ventricles and sulci consistent with age-related atrophy. Low-attenuation changes in the deep white matter consistent with small vessel ischemia. Vascular: No hyperdense vessel or unexpected calcification. Skull: The skull is intact. No fracture or focal lesion identified. Mild soft tissue swelling seen over the left frontal skull. Sinuses/Orbits: The visualized paranasal sinuses and mastoid air cells are clear. The orbits and globes intact. Other: None Cervical spine: Alignment: Physiologic Skull base and vertebrae: Visualized skull base is intact. No atlanto-occipital dissociation. The vertebral body heights are well maintained. No fracture or pathologic osseous lesion seen. Soft tissues and spinal canal: The visualized paraspinal soft tissues are unremarkable. No prevertebral soft tissue swelling is seen. The spinal canal is grossly unremarkable, no large epidural collection or significant canal narrowing. Disc levels: Multilevel cervical spine spondylosis seen with disc osteophyte complex and uncovertebral osteophytes most notable at C4-C5 and C5-C6. Upper chest: The lung apices are clear. Thoracic inlet is within normal limits. Other: None IMPRESSION: No acute intracranial abnormality. Findings consistent with age related atrophy and chronic small vessel ischemia No acute fracture or malalignment of the spine. Electronically Signed   By: Prudencio Pair M.D.   On: 04/07/2020 20:43   DG Shoulder Right Port  Result Date: 04/13/2020 CLINICAL DATA:  Right shoulder pain. EXAM: PORTABLE RIGHT SHOULDER  COMPARISON:  None. FINDINGS: No acute fracture or dislocation. Small well corticated ossific density near the superior glenoid, potentially related to old trauma versus small intra-articular body. Old healed fracture of the right mid clavicle. Small humeral head marginal osteophytes. Joint spaces are preserved. Soft tissues are unremarkable. IMPRESSION: 1. Early glenohumeral osteoarthritis. No acute osseous abnormality. Electronically Signed   By: Titus Dubin M.D.   On: 04/13/2020 12:01   DG C-Arm 1-60 Min  Result Date: 04/09/2020 CLINICAL DATA:  ORIF left femur. EXAM: DG C-ARM 1-60 MIN; LEFT FEMUR 2 VIEWS FLUOROSCOPY TIME:  Fluoroscopy Time:  1 minutes 41 seconds Reported radiation: 17 mGy. COMPARISON:  April 07, 2020 FINDINGS: Four C-arm fluoroscopic images were obtained intraoperatively and submitted for post operative interpretation. These images demonstrate fixation of an inter trochanteric fracture of the left femur with intramedullary nail and screws. Improved, near anatomic alignment. No unexpected findings. Please see the performing provider's procedural report for further detail. IMPRESSION: Intraoperative fluoroscopic imaging, as detailed above. Electronically Signed   By: Margaretha Sheffield MD   On: 04/09/2020 17:10   DG Hip Unilat With Pelvis 2-3 Views Left  Result Date: 04/07/2020 CLINICAL DATA:  Pain EXAM: DG HIP (WITH OR WITHOUT PELVIS) 2-3V LEFT  COMPARISON:  None. FINDINGS: There is an acute, comminuted and displaced intratrochanteric fracture of the proximal left femur. There is no dislocation. There is osteopenia. There are moderate degenerative changes of both hips. IMPRESSION: Acute, comminuted and displaced intratrochanteric fracture of the proximal left femur. Electronically Signed   By: Constance Holster M.D.   On: 04/07/2020 20:24   DG FEMUR MIN 2 VIEWS LEFT  Result Date: 04/09/2020 CLINICAL DATA:  ORIF left femur. EXAM: DG C-ARM 1-60 MIN; LEFT FEMUR 2 VIEWS FLUOROSCOPY TIME:   Fluoroscopy Time:  1 minutes 41 seconds Reported radiation: 17 mGy. COMPARISON:  April 07, 2020 FINDINGS: Four C-arm fluoroscopic images were obtained intraoperatively and submitted for post operative interpretation. These images demonstrate fixation of an inter trochanteric fracture of the left femur with intramedullary nail and screws. Improved, near anatomic alignment. No unexpected findings. Please see the performing provider's procedural report for further detail. IMPRESSION: Intraoperative fluoroscopic imaging, as detailed above. Electronically Signed   By: Margaretha Sheffield MD   On: 04/09/2020 17:10   XR HIP UNILAT W OR W/O PELVIS 2-3 VIEWS LEFT  Result Date: 04/23/2020 X-rays reveal stable fracture without hardware complication  XR Wrist Complete Left  Result Date: 04/23/2020 X-rays show stable fixation of the fracture with minimal interval change   Assessment/Plan  Closed fracture of left hip with routine healing, subsequent encounter Pain Controlled WBAT Fracture of the lower end of left radius status post closed reduction Elevate Left Arm to reduce swelling Will keep Close Monitor  Has appointment with Ortho in few days  Urinary retention Doing well  Foley was taken out a week ago  Post op Anemia Hgb stable at 9.4  Senile dementia with delirium with behavioral disturbance (HCC) On Seroquel Restart Depakote to help with her behaviors    Mixed hyperlipidemia Continue Statin  Tachycardia Doing well on Low dose of Lopressor  Depression with anxiety Continue on Zoloft Also on Seroquel  Frequent falls Due to less awareness cognitive deficits Hypothyroidism, unspecified type TSH normal in 10/21 Primary hypertension On Cozaar and Lopressor Diabetes mellitus type 2 in obese (HCC) Amaryl 1 mg   Family/ staff Communication:   Labs/tests ordered:    Total time spent in this patient care encounter was  45_  minutes; greater than 50% of the visit spent  counseling patient and staff, reviewing records , Labs and coordinating care for problems addressed at this encounter.

## 2020-05-07 ENCOUNTER — Ambulatory Visit (INDEPENDENT_AMBULATORY_CARE_PROVIDER_SITE_OTHER): Payer: Medicare Other

## 2020-05-07 ENCOUNTER — Encounter: Payer: Self-pay | Admitting: Orthopaedic Surgery

## 2020-05-07 ENCOUNTER — Ambulatory Visit (INDEPENDENT_AMBULATORY_CARE_PROVIDER_SITE_OTHER): Payer: Medicare Other | Admitting: Orthopaedic Surgery

## 2020-05-07 DIAGNOSIS — S52502D Unspecified fracture of the lower end of left radius, subsequent encounter for closed fracture with routine healing: Secondary | ICD-10-CM | POA: Diagnosis not present

## 2020-05-07 DIAGNOSIS — S72002D Fracture of unspecified part of neck of left femur, subsequent encounter for closed fracture with routine healing: Secondary | ICD-10-CM

## 2020-05-07 NOTE — Progress Notes (Signed)
Post-Op Visit Note   Patient: Lisa Castillo           Date of Birth: 11/23/1928           MRN: [id number] Visit Date: 05/07/2020 PCP: [name], MD   Assessment & Plan:  Chief Complaint:  Chief Complaint  Patient presents with  . Left Wrist - Pain  . Left Hip - Pain   Visit Diagnoses:  1. Closed fracture of distal end of left radius with routine healing, unspecified fracture morphology, subsequent encounter   2. Closed fracture of left hip with routine healing, subsequent encounter     Plan:   Alycea is 4 weeks status post left intertrochanteric fracture and left distal radius fracture.  She is doing well overall.  Her daughter is with her today.  Left hip surgical scars are all fully healed.  She has a well fitting sugar tong splint.  X-rays are stable without any complications.  Today we transition to a long-arm cast so that she can platform weight-bear with PT.  She will continue to be weight-bear as tolerated to the left lower extremity.  Recheck in 2 weeks with two-view x-rays of the left wrist out of the cast.  Follow-Up Instructions: Return in about 2 weeks (around 05/21/2020).   Orders:  Orders Placed This Encounter  Procedures  . XR Wrist Complete Left  . XR FEMUR MIN 2 VIEWS LEFT   No orders of the defined types were placed in this encounter.   Imaging: XR FEMUR MIN 2 VIEWS LEFT  Result Date: 05/07/2020 Stable fixation and alignment of intertrochanteric fracture without hardware complications.  XR Wrist Complete Left  Result Date: 05/07/2020 Stable alignment of distal radius fracture without any interval changes.   PMFS History: Patient Active Problem List   Diagnosis Date Noted  . Urinary retention 04/15/2020  . Anemia, blood loss 04/15/2020  . Unspecified fracture of the lower end of left radius, initial encounter for closed fracture 04/07/2020  . Diabetes mellitus type 2 in nonobese ([facility]) 04/07/2020  . Dementia with behavioral disturbance  ([facility]) 04/07/2020  . Fall as cause of accidental injury in residential institution as place of occurrence 04/07/2020  . Closed left hip fracture ([facility]) 04/07/2020  . Frequent falls 02/25/2020  . HTN (hypertension) 02/04/2020  . Hypokalemia 01/21/2020  . Rectal bleed 01/18/2020  . Right lower lobe pneumonia 01/14/2020  . Depression with anxiety 12/31/2019  . Left tubo-ovarian mass 12/31/2019  . Slow transit constipation 12/31/2019  . Burst fracture of lumbar vertebra, sequela 12/27/2019  . Tachycardia 04/03/2019  . Osteopenia 06/14/2018  . Diabetes mellitus type 2 in obese ([facility]) 11/09/2017  . Obesity (BMI 30-39.9) 01/19/2017  . Mixed hyperlipidemia 06/29/2016  . Gait abnormality 04/27/2016  . Hearing loss   . Hypothyroidism   . Insomnia    Past Medical History:  Diagnosis Date  . Hearing loss   . History of fracture of clavicle   . Hypothyroidism   . Insomnia   . Tinnitus     Family History  Problem Relation Age of Onset  . Heart disease Mother   . Heart disease Father     Past Surgical History:  Procedure Laterality Date  . CHOLECYSTECTOMY  2007   Dr. Verdene Lennert  . CLAVICLE EXCISION  1986  . INTRAMEDULLARY (IM) NAIL INTERTROCHANTERIC Left 04/09/2020   Procedure: LEFT INTERTROCHANTERIC INTRAMEDULLARY (IM) NAIL;  Surgeon: Leandrew Koyanagi, MD;  Location: [facility];  Service: Orthopedics;  Laterality: Left;  . MOHS  SURGERY     past 10 years chest & legs  . OPEN REDUCTION INTERNAL FIXATION (ORIF) DISTAL RADIAL FRACTURE Left 04/09/2020   Procedure: CLOSED REDUCTION LEFT DISTAL RADIUS FRACTURE;  Surgeon: Leandrew Koyanagi, MD;  Location: [facility];  Service: Orthopedics;  Laterality: Left;   Social History   Occupational History  . Occupation: retired Web designer  Tobacco Use  . Smoking status: Never Smoker  . Smokeless tobacco: Never Used  Vaping Use  . Vaping Use: Never used  Substance and Sexual Activity  . Alcohol use: Yes    Comment: 4 per week  . Drug use: No  . Sexual activity:  Never

## 2020-05-21 ENCOUNTER — Ambulatory Visit: Payer: Medicare Other | Admitting: Orthopaedic Surgery

## 2020-05-27 ENCOUNTER — Ambulatory Visit (INDEPENDENT_AMBULATORY_CARE_PROVIDER_SITE_OTHER): Payer: Medicare Other

## 2020-05-27 ENCOUNTER — Ambulatory Visit (INDEPENDENT_AMBULATORY_CARE_PROVIDER_SITE_OTHER): Payer: Medicare Other | Admitting: Orthopaedic Surgery

## 2020-05-27 ENCOUNTER — Encounter: Payer: Self-pay | Admitting: Orthopaedic Surgery

## 2020-05-27 ENCOUNTER — Other Ambulatory Visit: Payer: Self-pay

## 2020-05-27 DIAGNOSIS — Z8781 Personal history of (healed) traumatic fracture: Secondary | ICD-10-CM | POA: Diagnosis not present

## 2020-05-27 DIAGNOSIS — S52502D Unspecified fracture of the lower end of left radius, subsequent encounter for closed fracture with routine healing: Secondary | ICD-10-CM | POA: Diagnosis not present

## 2020-05-27 NOTE — Progress Notes (Signed)
Office Visit Note   Patient: Lisa Castillo           Date of Birth: March 30, 1928           MRN: [id number] Visit Date: 05/27/2020              Requested by: [name], MD [place],  [place] [id number] PCP: [name], MD   Assessment & Plan: Visit Diagnoses:  1. Closed fracture of distal end of left radius with routine healing, unspecified fracture morphology, subsequent encounter   2. S/p left hip fracture     Plan: Impression is 7 weeks status post left hip IM nail and closed reduction left distal radius fracture.  In regards to her hip, she will continue to advance with physical therapy.  In regards to the wrist fracture, we will transition her into a short arm cast.  Follow-up with [place] in 3 weeks time for repeat evaluation and x-rays of the left femur and three-view x-rays of the left wrist.  Call with concerns or questions in meantime.  This was all discussed with her daughter who was present during entire encounter.  Follow-Up Instructions: Return in about 3 weeks (around 06/17/2020).   Orders:  Orders Placed This Encounter  Procedures  . XR Wrist 2 Views Left  . XR FEMUR MIN 2 VIEWS LEFT   No orders of the defined types were placed in this encounter.     Procedures: No procedures performed   Clinical Data: No additional findings.   Subjective: Chief Complaint  Patient presents with  . Left Wrist - Follow-up, Fracture    HPI patient is a pleasant 87 year old female who comes in today 7 weeks out close reduction left distal radius fracture as well as IM nail left intertrochanteric femur fracture.  She has been doing well.  She is at friend's home [place] where she is getting physical therapy daily.  She is ambulating primarily with a walker but is having some difficulty as she has been wearing a long-arm cast.  She has minimal pain.  Review of Systems as detailed in HPI.  All others reviewed and are negative.   Objective: Vital Signs: There  were no vitals taken for this visit.  Physical Exam well-developed well-nourished female no acute distress.  Alert oriented x3.  Ortho Exam left hip exam shows painless range of motion.  Left wrist exam shows minimal swelling.  No tenderness.  Limited range of motion secondary to stiffness.  She is neurovascular intact distally.  Specialty Comments:  No specialty comments available.  Imaging: XR FEMUR MIN 2 VIEWS LEFT  Result Date: 05/27/2020 X-rays demonstrate stable alignment of the fracture without hardware complication  XR Wrist 2 Views Left  Result Date: 05/27/2020 X-rays demonstrate stable alignment of fracture with considerable callus formation    PMFS History: Patient Active Problem List   Diagnosis Date Noted  . Urinary retention 04/15/2020  . Anemia, blood loss 04/15/2020  . Unspecified fracture of the lower end of left radius, initial encounter for closed fracture 04/07/2020  . Diabetes mellitus type 2 in nonobese ([place]) 04/07/2020  . Dementia with behavioral disturbance ([place]) 04/07/2020  . Fall as cause of accidental injury in residential institution as place of occurrence 04/07/2020  . Closed left hip fracture ([place]) 04/07/2020  . Frequent falls 02/25/2020  . HTN (hypertension) 02/04/2020  . Hypokalemia 01/21/2020  . Rectal bleed 01/18/2020  . Right lower lobe pneumonia 01/14/2020  . Depression with anxiety  12/31/2019  . Left tubo-ovarian mass 12/31/2019  . Slow transit constipation 12/31/2019  . Burst fracture of lumbar vertebra, sequela 12/27/2019  . Tachycardia 04/03/2019  . Osteopenia 06/14/2018  . Diabetes mellitus type 2 in obese ([place]) 11/09/2017  . Obesity (BMI 30-39.9) 01/19/2017  . Mixed hyperlipidemia 06/29/2016  . Gait abnormality 04/27/2016  . Hearing loss   . Hypothyroidism   . Insomnia    Past Medical History:  Diagnosis Date  . Hearing loss   . History of fracture of clavicle   . Hypothyroidism   . Insomnia   . Tinnitus     Family  History  Problem Relation Age of Onset  . Heart disease Mother   . Heart disease Father     Past Surgical History:  Procedure Laterality Date  . CHOLECYSTECTOMY  2007   Dr. Verdene Lennert  . CLAVICLE EXCISION  1986  . INTRAMEDULLARY (IM) NAIL INTERTROCHANTERIC Left 04/09/2020   Procedure: LEFT INTERTROCHANTERIC INTRAMEDULLARY (IM) NAIL;  Surgeon: Leandrew Koyanagi, MD;  Location: [facility];  Service: Orthopedics;  Laterality: Left;  . MOHS SURGERY     past 10 years chest & legs  . OPEN REDUCTION INTERNAL FIXATION (ORIF) DISTAL RADIAL FRACTURE Left 04/09/2020   Procedure: CLOSED REDUCTION LEFT DISTAL RADIUS FRACTURE;  Surgeon: Leandrew Koyanagi, MD;  Location: [facility];  Service: Orthopedics;  Laterality: Left;   Social History   Occupational History  . Occupation: retired Web designer  Tobacco Use  . Smoking status: Never Smoker  . Smokeless tobacco: Never Used  Vaping Use  . Vaping Use: Never used  Substance and Sexual Activity  . Alcohol use: Yes    Comment: 4 per week  . Drug use: No  . Sexual activity: Never

## 2020-06-06 ENCOUNTER — Non-Acute Institutional Stay (SKILLED_NURSING_FACILITY): Payer: Medicare Other | Admitting: Orthopedic Surgery

## 2020-06-06 ENCOUNTER — Encounter: Payer: Self-pay | Admitting: Orthopedic Surgery

## 2020-06-06 DIAGNOSIS — R634 Abnormal weight loss: Secondary | ICD-10-CM | POA: Diagnosis not present

## 2020-06-06 DIAGNOSIS — R296 Repeated falls: Secondary | ICD-10-CM

## 2020-06-06 DIAGNOSIS — I10 Essential (primary) hypertension: Secondary | ICD-10-CM

## 2020-06-06 DIAGNOSIS — F0151 Vascular dementia with behavioral disturbance: Secondary | ICD-10-CM | POA: Diagnosis not present

## 2020-06-06 DIAGNOSIS — S72002D Fracture of unspecified part of neck of left femur, subsequent encounter for closed fracture with routine healing: Secondary | ICD-10-CM

## 2020-06-06 DIAGNOSIS — S52592G Other fractures of lower end of left radius, subsequent encounter for closed fracture with delayed healing: Secondary | ICD-10-CM

## 2020-06-06 DIAGNOSIS — F01518 Vascular dementia, unspecified severity, with other behavioral disturbance: Secondary | ICD-10-CM

## 2020-06-06 DIAGNOSIS — F418 Other specified anxiety disorders: Secondary | ICD-10-CM | POA: Diagnosis not present

## 2020-06-06 DIAGNOSIS — K5901 Slow transit constipation: Secondary | ICD-10-CM

## 2020-06-06 NOTE — Progress Notes (Signed)
Location:  [facility] Room Number: 36 Place of Service:  SNF [phone]) Provider: Windell Moulding, AGNP-C  [name], MD  Patient Care Team: [name], MD as PCP - General (Internal Medicine)  Extended Emergency Contact Information Primary Emergency Contact: Harris,Margaret Address: [address]          [place], [place] [address] [name] of [place] Phone: [phone] Mobile Phone: [phone] Relation: Daughter  Goals of care: Advanced Directive information Advanced Directives 04/07/2020  Does Patient Have a Medical Advance Directive? No;Yes  Type of Advance Directive Out of facility DNR (pink MOST or yellow form);[place];Living will  Does patient want to make changes to medical advance directive? No - Patient declined  Copy of [place] in Chart? No - copy available, Physician notified  Would patient like information on creating a medical advance directive? No - Patient declined  Pre-existing out of facility DNR order (yellow form or pink MOST form) [place]/Yellow Form available - Physician notified to receive inpatient order     Chief Complaint  Patient presents with  . Medical Management of Chronic Issues    Routine follow up visit.Discuss need for eye exam and COVID booster    HPI:  Pt is a 87 y.o. female seen today for medical management of chronic diseases.    04/07 nursing staff reports she was yelling for help and was found on the floor next to her bed. No apparent injuries. In the past few months she has had several falls, one resulted with fracture of left hip and left wrist. 03/29 she was seen by Dr. Erlinda Hong. Advised to continue PT for hip and transitioned to short arm cast for wrist fracture. 3 week follow up for x-ray of left wrist recommended.She denies hip or wrist pain today.Goal to return back to independent living.   Nursing staff reports incidents of agitation an anxiety in evening. Receives  Depakote and Remeron daily. Today, she is very pleasant, alert to self and person. Follows commands and can express needs.   Recent weights are as follows:  04/01- 138.2 lbs  03/01- 137.6 lbs  02/15- 153 lbs   Appetite fair. Receives Boost shakes.   Last BM today.        Past Medical History:  Diagnosis Date  . Hearing loss   . History of fracture of clavicle   . Hypothyroidism   . Insomnia   . Tinnitus    Past Surgical History:  Procedure Laterality Date  . CHOLECYSTECTOMY  2007   Dr. Verdene Lennert  . CLAVICLE EXCISION  1986  . INTRAMEDULLARY (IM) NAIL INTERTROCHANTERIC Left 04/09/2020   Procedure: LEFT INTERTROCHANTERIC INTRAMEDULLARY (IM) NAIL;  Surgeon: Leandrew Koyanagi, MD;  Location: [facility];  Service: Orthopedics;  Laterality: Left;  . MOHS SURGERY     past 10 years chest & legs  . OPEN REDUCTION INTERNAL FIXATION (ORIF) DISTAL RADIAL FRACTURE Left 04/09/2020   Procedure: CLOSED REDUCTION LEFT DISTAL RADIUS FRACTURE;  Surgeon: Leandrew Koyanagi, MD;  Location: [facility];  Service: Orthopedics;  Laterality: Left;    Allergies  Allergen Reactions  . Metformin And Related Diarrhea    Outpatient Encounter Medications as of 06/06/2020  Medication Sig  . acetaminophen (TYLENOL) 325 MG tablet Take 650 mg by mouth in the morning, at noon, and at bedtime.  Marland Kitchen atorvastatin (LIPITOR) 10 MG tablet Take 10 mg by mouth daily.  . Blood Glucose Monitoring Suppl KIT Use to test blood  sugar once daily daily. Dx: E11.69  . Cholecalciferol (VITAMIN D3) 2000 units TABS Take 1 tablet by mouth daily.   Marland Kitchen docusate sodium (COLACE) 100 MG capsule Take 1 capsule (100 mg total) by mouth 2 (two) times daily.  Marland Kitchen glimepiride (AMARYL) 1 MG tablet Take 1 mg by mouth daily with breakfast.  . glucose blood test strip Use to test blood sugar once daily. Dx: E11.69  . Lancets 30G MISC Use to test blood sugar once daily. Dx: E11.69  . Lancets Misc. KIT 1 Device by Does not apply route daily. Use as directed  .  levothyroxine (SYNTHROID) 75 MCG tablet TAKE 1 TABLET (75 MCG TOTAL) BY MOUTH DAILY BEFORE BREAKFAST.  Marland Kitchen losartan (COZAAR) 25 MG tablet Take 25 mg by mouth daily.  . methocarbamol (ROBAXIN) 500 MG tablet Take 250 mg by mouth every 8 (eight) hours as needed for muscle spasms.  . metoprolol tartrate (LOPRESSOR) 25 MG tablet Take 12.5 mg by mouth 2 (two) times daily.  . Nutritional Supplements (BOOST GLUCOSE CONTROL) LIQD Take by mouth. 1 can three times a day between meals  . oxyCODONE (OXY IR/ROXICODONE) 5 MG immediate release tablet Take 1 tablet (5 mg total) by mouth every 6 (six) hours as needed for moderate pain or severe pain.  . polyethylene glycol (MIRALAX / GLYCOLAX) 17 g packet Take 17 g by mouth daily as needed for moderate constipation.  . QUEtiapine (SEROQUEL) 25 MG tablet Take 12.5 mg by mouth at bedtime.  . sertraline (ZOLOFT) 25 MG tablet Take 50 mg by mouth daily.   Marland Kitchen zinc oxide 20 % ointment Apply 1 application topically as needed for irritation.   No facility-administered encounter medications on file as of 06/06/2020.    Review of Systems  Unable to perform ROS: Dementia    Immunization History  Administered Date(s) Administered  . DTaP 07/20/2013  . Influenza, High Dose Seasonal PF 12/07/2016  . Influenza,inj,Quad PF,6+ Mos 12/01/2017  . Influenza-Unspecified 11/20/2014, 12/12/2015, 11/20/2019  . Moderna Sars-Covid-2 Vaccination 03/05/2019, 04/02/2019  . PPD Test 08/06/2014  . Pneumococcal Conjugate-13 11/07/2013  . Pneumococcal Polysaccharide-23 03/22/2017  . Zoster 03/01/2006  . Zoster Recombinat (Shingrix) 06/07/2017, 08/26/2017   Pertinent  Health Maintenance Due  Topic Date Due  . OPHTHALMOLOGY EXAM  Never done  . FOOT EXAM  07/10/2020  . INFLUENZA VACCINE  09/29/2020  . HEMOGLOBIN A1C  10/05/2020  . DEXA SCAN  Completed  . PNA vac Low Risk Adult  Completed   Fall Risk  12/19/2019 10/17/2019 08/22/2019 07/11/2019 06/13/2019  Falls in the past year? 0 0 0 0 0   Number falls in past yr: 0 0 0 0 0  Injury with Fall? - - 0 - -   Functional Status Survey:    Vitals:   06/06/20 1444  BP: 140/70  Pulse: (!) 101  Temp: (!) 97.4 F (36.3 C)  SpO2: 97%  Weight: 138 lb 3.2 oz (62.7 kg)  Height: 5' (1.524 m)   Body mass index is 26.99 kg/m. Physical Exam Vitals reviewed.  Constitutional:      General: She is not in acute distress. HENT:     Head: Normocephalic.  Eyes:     General:        Right eye: No discharge.        Left eye: No discharge.  Neck:     Vascular: No carotid bruit.  Cardiovascular:     Rate and Rhythm: Normal rate and regular rhythm.     Pulses:  Normal pulses.     Heart sounds: Normal heart sounds. No murmur heard.   Pulmonary:     Effort: Pulmonary effort is normal. No respiratory distress.     Breath sounds: Normal breath sounds. No wheezing.  Abdominal:     General: Bowel sounds are normal. There is no distension.     Palpations: Abdomen is soft.     Tenderness: There is no abdominal tenderness.  Musculoskeletal:     Cervical back: Normal range of motion.     Right lower leg: No edema.     Left lower leg: No edema.     Comments: Left forearm short cast CDI. Fingers with FROM, cap refill < 3 sec.   Lymphadenopathy:     Cervical: No cervical adenopathy.  Skin:    General: Skin is warm and dry.     Capillary Refill: Capillary refill takes less than 2 seconds.  Neurological:     General: No focal deficit present.     Mental Status: She is alert. Mental status is at baseline.     Motor: Weakness present.     Gait: Gait abnormal.     Comments: wheelchair  Psychiatric:        Mood and Affect: Mood normal. Affect is flat.        Behavior: Behavior normal.        Cognition and Memory: Memory is impaired.     Labs reviewed: Recent Labs    04/10/20 0155 04/11/20 0136 04/14/20 0354 04/17/20 0000  NA 138 141 142 142  K 3.9 3.5 3.4* 3.7  CL 104 108 103 107  CO2 _0 26*  GLUCOSE 150* 106* 101*  --    BUN _1 CREATININE 0.62 0.57 0.61 0.5  CALCIUM 8.5* 8.0* 8.3* 8.0*   Recent Labs    08/16/19 0800 12/13/19 0000 01/10/20 0000 04/17/20 0000  AST _2 ALT _3 ALKPHOS  --   --  124 74  BILITOT 0.4 0.4  --   --   PROT 6.4 6.5  --   --   ALBUMIN  --   --  3.5 2.6*   Recent Labs    01/10/20 0000 01/21/20 0000 01/28/20 0000 02/07/20 0000 04/07/20 1921 04/08/20 0410 04/11/20 0136 04/12/20 0200 04/12/20 1841 04/14/20 0354  WBC 14.3   < > 7.2   < > 14.1*   < > 10.2 9.0  --  11.0*  NEUTROABS 10,082.00  --  4,673.00  --  10.0*  --   --   --   --   --   HGB 13.6   < > 12.0   < > 11.3*   < > 8.1* 7.5* 9.1* 9.4*  HCT 42   < > 37   < > 35.1*   < > 26.2* 23.4* 27.0* 30.9*  MCV  --   --   --   --  82.2   < > 84.8 83.9  --  85.8  PLT 523*   < > 282   < > 341   < > 276 270  --  317   < > = values in this interval not displayed.   Lab Results  Component Value Date   TSH 3.509 12/29/2019   Lab Results  Component Value Date   HGBA1C 5.5 04/07/2020   Lab Results  Component Value Date   CHOL 95 04/17/2020   HDL 22 (A) 04/17/2020  LDLCALC 50 04/17/2020   TRIG 150 04/17/2020   CHOLHDL 3.0 12/13/2019    Significant Diagnostic Results in last 30 days:  XR FEMUR MIN 2 VIEWS LEFT  Result Date: 05/27/2020 X-rays demonstrate stable alignment of the fracture without hardware complication  XR Wrist 2 Views Left  Result Date: 05/27/2020 X-rays demonstrate stable alignment of fracture with considerable callus formation   Assessment/Plan 1. Vascular dementia with behavior disturbance ([place]) - increased agitation in evenings, fall 04/07 - increase Seroquel to 25 mg po QHS - cont falls safety plan  2. Closed fracture of left hip with routine healing, subsequent encounter - followed by Dr. Erlinda Hong - advised to continue PT - cbc/diff  3. Other closed fracture of distal end of left radius with delayed healing, subsequent encounter - followed by Dr. Erlinda Hong -  short cast placed - f/u xray in 3 weeks  4. Depression with anxiety - cont zoloft and Remeron regimen  5. Frequent falls - ongoing, remains high risk for fall - cont mats on side of bed - cont falls safety plan  6. Weight loss - lost about 15 lbs since February - appetite fair - cont boost and remeron   7. Primary hypertension - at goal with losartan  8. Slow transit constipation - abdomen soft with active bowel sounds - stable with colace and miralax regimen    Family/ staff Communication: Plan discussed with daughter and nurse  Labs/tests ordered: none

## 2020-06-09 LAB — CBC AND DIFFERENTIAL
HCT: 38 (ref 36–46)
Hemoglobin: 12.1 (ref 12.0–16.0)
Neutrophils Absolute: 5772
Platelets: 299 (ref 150–399)
WBC: 9.8

## 2020-06-09 LAB — CBC: RBC: 4.51 (ref 3.87–5.11)

## 2020-06-16 ENCOUNTER — Encounter: Payer: Self-pay | Admitting: Orthopedic Surgery

## 2020-06-16 ENCOUNTER — Non-Acute Institutional Stay (SKILLED_NURSING_FACILITY): Payer: Medicare Other | Admitting: Orthopedic Surgery

## 2020-06-16 DIAGNOSIS — L2089 Other atopic dermatitis: Secondary | ICD-10-CM | POA: Diagnosis not present

## 2020-06-16 NOTE — Progress Notes (Signed)
Location:  [facility] Room Number: 23 Place of Service:  SNF [phone]) Provider: Windell Moulding, AGNP-C  [name], MD  Patient Care Team: [name], MD as PCP - General (Internal Medicine)  Extended Emergency Contact Information Primary Emergency Contact: Harris,Margaret Address: [address]          [place], [place] [address] [name] of [place] Phone: [phone] Mobile Phone: [phone] Relation: Daughter  Goals of care: Advanced Directive information Advanced Directives 06/06/2020  Does Patient Have a Medical Advance Directive? Yes  Type of Paramedic of [place];Living will;Out of facility DNR (pink MOST or yellow form)  Does patient want to make changes to medical advance directive? No - Patient declined  Copy of [place] in Chart? Yes - validated most recent copy scanned in chart (See row information)  Would patient like information on creating a medical advance directive? -  Pre-existing out of facility DNR order (yellow form or pink MOST form) Yellow form placed in chart (order not valid for inpatient use)     Chief Complaint  Patient presents with  . Acute Visit    HPI:  Pt is a 87 y.o. female seen today for acute visit for skin rash.   Nursing staff reports she developed a rash about 2 days ago. Rash mainly located on middle left back near bra-line. Rash also noted on hips, buttocks and thighs. Aleli states rash is very itchy at times. Believes moisturizer makes the itching better and worse. Unknown if any soaps are detergents have been changed.   Nurse does not report any other concerns, vitals stable.      Past Medical History:  Diagnosis Date  . Hearing loss   . History of fracture of clavicle   . Hypothyroidism   . Insomnia   . Tinnitus    Past Surgical History:  Procedure Laterality Date  . CHOLECYSTECTOMY  2007   Dr. Verdene Lennert  . CLAVICLE EXCISION  1986  . INTRAMEDULLARY  (IM) NAIL INTERTROCHANTERIC Left 04/09/2020   Procedure: LEFT INTERTROCHANTERIC INTRAMEDULLARY (IM) NAIL;  Surgeon: Leandrew Koyanagi, MD;  Location: [facility];  Service: Orthopedics;  Laterality: Left;  . MOHS SURGERY     past 10 years chest & legs  . OPEN REDUCTION INTERNAL FIXATION (ORIF) DISTAL RADIAL FRACTURE Left 04/09/2020   Procedure: CLOSED REDUCTION LEFT DISTAL RADIUS FRACTURE;  Surgeon: Leandrew Koyanagi, MD;  Location: [facility];  Service: Orthopedics;  Laterality: Left;    Allergies  Allergen Reactions  . Metformin And Related Diarrhea    Outpatient Encounter Medications as of 06/16/2020  Medication Sig  . acetaminophen (TYLENOL) 325 MG tablet Take 650 mg by mouth in the morning, at noon, and at bedtime.  Marland Kitchen atorvastatin (LIPITOR) 10 MG tablet Take 10 mg by mouth daily.  . Blood Glucose Monitoring Suppl KIT Use to test blood sugar once daily daily. Dx: E11.69  . Cholecalciferol (VITAMIN D3) 2000 units TABS Take 1 tablet by mouth daily.   Marland Kitchen docusate sodium (COLACE) 100 MG capsule Take 1 capsule (100 mg total) by mouth 2 (two) times daily.  Marland Kitchen glimepiride (AMARYL) 1 MG tablet Take 1 mg by mouth daily with breakfast.  . glucose blood test strip Use to test blood sugar once daily. Dx: E11.69  . Lancets 30G MISC Use to test blood sugar once daily. Dx: E11.69  . Lancets Misc. KIT 1 Device by Does not apply route daily. Use as directed  .  levothyroxine (SYNTHROID) 75 MCG tablet TAKE 1 TABLET (75 MCG TOTAL) BY MOUTH DAILY BEFORE BREAKFAST.  Marland Kitchen losartan (COZAAR) 25 MG tablet Take 25 mg by mouth daily.  . methocarbamol (ROBAXIN) 500 MG tablet Take 250 mg by mouth every 8 (eight) hours as needed for muscle spasms.  . metoprolol tartrate (LOPRESSOR) 25 MG tablet Take 12.5 mg by mouth 2 (two) times daily.  . Nutritional Supplements (BOOST GLUCOSE CONTROL) LIQD Take by mouth. 1 can three times a day between meals  . oxyCODONE (OXY IR/ROXICODONE) 5 MG immediate release tablet Take 1 tablet (5 mg total) by mouth  every 6 (six) hours as needed for moderate pain or severe pain.  . polyethylene glycol (MIRALAX / GLYCOLAX) 17 g packet Take 17 g by mouth daily as needed for moderate constipation.  . QUEtiapine (SEROQUEL) 25 MG tablet Take 25 mg by mouth at bedtime.  . sertraline (ZOLOFT) 25 MG tablet Take 50 mg by mouth daily.   Marland Kitchen zinc oxide 20 % ointment Apply 1 application topically as needed for irritation.   No facility-administered encounter medications on file as of 06/16/2020.    Review of Systems  Constitutional: Negative for activity change and appetite change.  Respiratory: Negative for cough, shortness of breath and wheezing.   Cardiovascular: Negative for chest pain and leg swelling.  Skin: Positive for rash.  Psychiatric/Behavioral: Positive for confusion. Negative for dysphoric mood. The patient is not nervous/anxious.     Immunization History  Administered Date(s) Administered  . DTaP 07/20/2013  . Influenza, High Dose Seasonal PF 12/07/2016  . Influenza,inj,Quad PF,6+ Mos 12/01/2017  . Influenza-Unspecified 11/20/2014, 12/12/2015, 11/20/2019  . Moderna Sars-Covid-2 Vaccination 03/05/2019, 04/02/2019  . PPD Test 08/06/2014  . Pneumococcal Conjugate-13 11/07/2013  . Pneumococcal Polysaccharide-23 03/22/2017  . Zoster 03/01/2006  . Zoster Recombinat (Shingrix) 06/07/2017, 08/26/2017   Pertinent  Health Maintenance Due  Topic Date Due  . OPHTHALMOLOGY EXAM  Never done  . FOOT EXAM  07/10/2020  . INFLUENZA VACCINE  09/29/2020  . HEMOGLOBIN A1C  10/05/2020  . DEXA SCAN  Completed  . PNA vac Low Risk Adult  Completed   Fall Risk  12/19/2019 10/17/2019 08/22/2019 07/11/2019 06/13/2019  Falls in the past year? 0 0 0 0 0  Number falls in past yr: 0 0 0 0 0  Injury with Fall? - - 0 - -   Functional Status Survey:    Vitals:   06/16/20 1203  BP: 120/60  Pulse: 92  Resp: 20  Temp: (!) 97.4 F (36.3 C)  SpO2: 94%  Weight: 138 lb 3.2 oz (62.7 kg)   Body mass index is 26.99  kg/m. Physical Exam Vitals reviewed.  Constitutional:      General: She is not in acute distress. Cardiovascular:     Rate and Rhythm: Normal rate and regular rhythm.     Pulses: Normal pulses.     Heart sounds: Normal heart sounds. No murmur heard.   Pulmonary:     Effort: Pulmonary effort is normal. No respiratory distress.     Breath sounds: Normal breath sounds. No wheezing.  Skin:    General: Skin is warm and dry.     Capillary Refill: Capillary refill takes less than 2 seconds.     Findings: Erythema and rash present.     Comments: Raised papular rash noted over bra-line and left middle back. Scratch marks present. No drainage. Skin appears dry in places. Minor places of dryness noted over buttocks, and thighs.   Neurological:  General: No focal deficit present.     Mental Status: She is alert. Mental status is at baseline.     Motor: Weakness present.     Gait: Gait abnormal.     Comments: wheelchair  Psychiatric:        Mood and Affect: Mood normal.        Behavior: Behavior normal.     Labs reviewed: Recent Labs    04/10/20 0155 04/11/20 0136 04/14/20 0354 04/17/20 0000  NA 138 141 142 142  K 3.9 3.5 3.4* 3.7  CL 104 108 103 107  CO2 _0 26*  GLUCOSE 150* 106* 101*  --   BUN _1 CREATININE 0.62 0.57 0.61 0.5  CALCIUM 8.5* 8.0* 8.3* 8.0*   Recent Labs    08/16/19 0800 12/13/19 0000 01/10/20 0000 04/17/20 0000  AST _2 ALT _3 ALKPHOS  --   --  124 74  BILITOT 0.4 0.4  --   --   PROT 6.4 6.5  --   --   ALBUMIN  --   --  3.5 2.6*   Recent Labs    01/10/20 0000 01/21/20 0000 01/28/20 0000 02/07/20 0000 04/07/20 1921 04/08/20 0410 04/11/20 0136 04/12/20 0200 04/12/20 1841 04/14/20 0354  WBC 14.3   < > 7.2   < > 14.1*   < > 10.2 9.0  --  11.0*  NEUTROABS 10,082.00  --  4,673.00  --  10.0*  --   --   --   --   --   HGB 13.6   < > 12.0   < > 11.3*   < > 8.1* 7.5* 9.1* 9.4*  HCT 42   < > 37   < > 35.1*   <  > 26.2* 23.4* 27.0* 30.9*  MCV  --   --   --   --  82.2   < > 84.8 83.9  --  85.8  PLT 523*   < > 282   < > 341   < > 276 270  --  317   < > = values in this interval not displayed.   Lab Results  Component Value Date   TSH 3.509 12/29/2019   Lab Results  Component Value Date   HGBA1C 5.5 04/07/2020   Lab Results  Component Value Date   CHOL 95 04/17/2020   HDL 22 (A) 04/17/2020   LDLCALC 50 04/17/2020   TRIG 150 04/17/2020   CHOLHDL 3.0 12/13/2019    Significant Diagnostic Results in last 30 days:  XR FEMUR MIN 2 VIEWS LEFT  Result Date: 05/27/2020 X-rays demonstrate stable alignment of the fracture without hardware complication  XR Wrist 2 Views Left  Result Date: 05/27/2020 X-rays demonstrate stable alignment of fracture with considerable callus formation   Assessment/Plan 1. Other atopic dermatitis - papular rash over middle back, suspect due to dryness - start hydrocortisone 1% topical- apply to back and buttocks TID x 7 days - bendaryl 25 mg po bid prn x 7 days for itching - cont applying lotion to skin after bathing for dryness.     Family/ staff Communication: plan discussed with patient and nurse  Labs/tests ordered: none

## 2020-06-17 ENCOUNTER — Ambulatory Visit (INDEPENDENT_AMBULATORY_CARE_PROVIDER_SITE_OTHER): Payer: Medicare Other

## 2020-06-17 ENCOUNTER — Other Ambulatory Visit: Payer: Self-pay

## 2020-06-17 ENCOUNTER — Ambulatory Visit (INDEPENDENT_AMBULATORY_CARE_PROVIDER_SITE_OTHER): Payer: Medicare Other | Admitting: Orthopaedic Surgery

## 2020-06-17 DIAGNOSIS — Z8781 Personal history of (healed) traumatic fracture: Secondary | ICD-10-CM

## 2020-06-17 DIAGNOSIS — M25552 Pain in left hip: Secondary | ICD-10-CM | POA: Diagnosis not present

## 2020-06-17 DIAGNOSIS — S52502D Unspecified fracture of the lower end of left radius, subsequent encounter for closed fracture with routine healing: Secondary | ICD-10-CM | POA: Diagnosis not present

## 2020-06-17 DIAGNOSIS — S72002D Fracture of unspecified part of neck of left femur, subsequent encounter for closed fracture with routine healing: Secondary | ICD-10-CM

## 2020-06-17 NOTE — Progress Notes (Signed)
Post-Op Visit Note   Patient: Lisa Castillo           Date of Birth: 09-24-1928           MRN: [id number] Visit Date: 06/17/2020 PCP: [name], MD   Assessment & Plan:  Chief Complaint:  Chief Complaint  Patient presents with  . Other     Follow up   Visit Diagnoses:  1. Closed fracture of distal end of left radius with routine healing, unspecified fracture morphology, subsequent encounter   2. S/p left hip fracture     Plan:   Lisa Castillo returns today for follow-up status post left intertrochanteric fracture and left distal radius fracture treated conservatively with closed reduction and splinting.  Overall doing well.  Reports no pain.  She is currently living at friend's home [place].  She is accompanied by her daughter today.  Her dementia is getting worse overall.  Left wrist shows mild to moderate swelling.  There is no bony tenderness.  Noted no neurovascular compromise.  Range of motion is quite good.  X-rays show fully healed distal radius fracture.  Left hip shows full healed surgical scars.  Painless range of motion of the hip.  X-rays demonstrate healed fracture without any hardware complications.  At this point her fractures have demonstrated healing and she can follow-up with [place] as needed.  We will provide her with a removable wrist brace that she can wean as tolerated.  Do recommend continue PT and OT at her facility.  We will see her back as needed.  Follow-Up Instructions: Return if symptoms worsen or fail to improve.   Orders:  Orders Placed This Encounter  Procedures  . XR Wrist Complete Left  . XR FEMUR MIN 2 VIEWS LEFT   No orders of the defined types were placed in this encounter.   Imaging: XR FEMUR MIN 2 VIEWS LEFT  Result Date: 06/17/2020 Stable fixation and healed left intertrochanteric fracture.  XR Wrist Complete Left  Result Date: 06/17/2020 Healed distal radius fracture with bony consolidation.   PMFS History: Patient Active  Problem List   Diagnosis Date Noted  . Urinary retention 04/15/2020  . Anemia, blood loss 04/15/2020  . Unspecified fracture of the lower end of left radius, initial encounter for closed fracture 04/07/2020  . Diabetes mellitus type 2 in nonobese ([place]) 04/07/2020  . Dementia with behavioral disturbance ([place]) 04/07/2020  . Fall as cause of accidental injury in residential institution as place of occurrence 04/07/2020  . Closed left hip fracture ([place]) 04/07/2020  . Frequent falls 02/25/2020  . HTN (hypertension) 02/04/2020  . Hypokalemia 01/21/2020  . Rectal bleed 01/18/2020  . Right lower lobe pneumonia 01/14/2020  . Depression with anxiety 12/31/2019  . Left tubo-ovarian mass 12/31/2019  . Slow transit constipation 12/31/2019  . Burst fracture of lumbar vertebra, sequela 12/27/2019  . Tachycardia 04/03/2019  . Osteopenia 06/14/2018  . Diabetes mellitus type 2 in obese ([place]) 11/09/2017  . Obesity (BMI 30-39.9) 01/19/2017  . Mixed hyperlipidemia 06/29/2016  . Gait abnormality 04/27/2016  . Hearing loss   . Hypothyroidism   . Insomnia    Past Medical History:  Diagnosis Date  . Hearing loss   . History of fracture of clavicle   . Hypothyroidism   . Insomnia   . Tinnitus     Family History  Problem Relation Age of Onset  . Heart disease Mother   . Heart disease Father     Past Surgical History:  Procedure  Laterality Date  . CHOLECYSTECTOMY  2007   Dr. Verdene Lennert  . CLAVICLE EXCISION  1986  . INTRAMEDULLARY (IM) NAIL INTERTROCHANTERIC Left 04/09/2020   Procedure: LEFT INTERTROCHANTERIC INTRAMEDULLARY (IM) NAIL;  Surgeon: Leandrew Koyanagi, MD;  Location: [facility];  Service: Orthopedics;  Laterality: Left;  . MOHS SURGERY     past 10 years chest & legs  . OPEN REDUCTION INTERNAL FIXATION (ORIF) DISTAL RADIAL FRACTURE Left 04/09/2020   Procedure: CLOSED REDUCTION LEFT DISTAL RADIUS FRACTURE;  Surgeon: Leandrew Koyanagi, MD;  Location: [facility];  Service: Orthopedics;  Laterality: Left;    Social History   Occupational History  . Occupation: retired Web designer  Tobacco Use  . Smoking status: Never Smoker  . Smokeless tobacco: Never Used  Vaping Use  . Vaping Use: Never used  Substance and Sexual Activity  . Alcohol use: Yes    Comment: 4 per week  . Drug use: No  . Sexual activity: Never

## 2020-06-19 ENCOUNTER — Non-Acute Institutional Stay (SKILLED_NURSING_FACILITY): Payer: Medicare Other | Admitting: Internal Medicine

## 2020-06-19 ENCOUNTER — Encounter: Payer: Self-pay | Admitting: Internal Medicine

## 2020-06-19 DIAGNOSIS — E782 Mixed hyperlipidemia: Secondary | ICD-10-CM

## 2020-06-19 DIAGNOSIS — F418 Other specified anxiety disorders: Secondary | ICD-10-CM

## 2020-06-19 DIAGNOSIS — S52592G Other fractures of lower end of left radius, subsequent encounter for closed fracture with delayed healing: Secondary | ICD-10-CM | POA: Diagnosis not present

## 2020-06-19 DIAGNOSIS — E039 Hypothyroidism, unspecified: Secondary | ICD-10-CM

## 2020-06-19 DIAGNOSIS — F0151 Vascular dementia with behavioral disturbance: Secondary | ICD-10-CM

## 2020-06-19 DIAGNOSIS — E119 Type 2 diabetes mellitus without complications: Secondary | ICD-10-CM

## 2020-06-19 DIAGNOSIS — R296 Repeated falls: Secondary | ICD-10-CM

## 2020-06-19 DIAGNOSIS — S72002D Fracture of unspecified part of neck of left femur, subsequent encounter for closed fracture with routine healing: Secondary | ICD-10-CM | POA: Diagnosis not present

## 2020-06-19 DIAGNOSIS — F01518 Vascular dementia, unspecified severity, with other behavioral disturbance: Secondary | ICD-10-CM

## 2020-06-19 NOTE — Progress Notes (Signed)
Location:    [facility] Room Number: 39 Place of Service:  SNF [phone]) Provider:  Veleta Miners MD  [name], MD  Patient Care Team: [name], MD as PCP - General (Internal Medicine)  Extended Emergency Contact Information Primary Emergency Contact: Harris,Margaret Address: [address]          [place], [place] [address] [name] of [place] Phone: [phone] Mobile Phone: [phone] Relation: Daughter  Code Status:  DNR Goals of care: Advanced Directive information Advanced Directives 06/06/2020  Does Patient Have a Medical Advance Directive? Yes  Type of Paramedic of [place];Living will;Out of facility DNR (pink MOST or yellow form)  Does patient want to make changes to medical advance directive? No - Patient declined  Copy of [place] in Chart? Yes - validated most recent copy scanned in chart (See row information)  Would patient like information on creating a medical advance directive? -  Pre-existing out of facility DNR order (yellow form or pink MOST form) Yellow form placed in chart (order not valid for inpatient use)     Chief Complaint  Patient presents with  . Acute Visit    Behaviors    HPI:  Pt is a 87 y.o. female seen today for an acute visit for Behavior issues  Admitted to hospitalfrom2/7-2/14for left hip fracture s/p ORIF on2/9and fracture of lower end of radius s/preduction Patient has a history of type 2 diabetes, hyperlipidemia, depression, dementia, hypothyroidism, osteopenia, stress and urge urinary incontinence, Tachycardia Patient was admitted in the hospital from 10/27-10/31 with L4 burst fracture after the fall  [name] in SNF for therapy and most likely long-term care Patient had acute agitation episode  yesterday.   Per nurses she usually stays calm till  2 to 3:00 in the evening.  That's when she starts seeking  exit behavior.  She called 911  3  times.  Tried to pack her stuff and get to her car Finally she was given Ativan and Calmed her  down.  Facility is thinking to move patient to lockdown unit.  She does have a sensor on her wheelchair When taking but the daughter is not sure if this is what she wants to do.  Patient does not have any dysuria.  No fever or chills  Her mental status was back to baseline this morning.  When I asked her what she called 911 she said that once in standing,  [name] gaining more strength now. Able to walk with Mild assist now Pain seems controlled Past Medical History:  Diagnosis Date  . Hearing loss   . History of fracture of clavicle   . Hypothyroidism   . Insomnia   . Tinnitus    Past Surgical History:  Procedure Laterality Date  . CHOLECYSTECTOMY  2007   Dr. Verdene Lennert  . CLAVICLE EXCISION  1986  . INTRAMEDULLARY (IM) NAIL INTERTROCHANTERIC Left 04/09/2020   Procedure: LEFT INTERTROCHANTERIC INTRAMEDULLARY (IM) NAIL;  Surgeon: Leandrew Koyanagi, MD;  Location: [facility];  Service: Orthopedics;  Laterality: Left;  . MOHS SURGERY     past 10 years chest & legs  . OPEN REDUCTION INTERNAL FIXATION (ORIF) DISTAL RADIAL FRACTURE Left 04/09/2020   Procedure: CLOSED REDUCTION LEFT DISTAL RADIUS FRACTURE;  Surgeon: Leandrew Koyanagi, MD;  Location: [facility];  Service: Orthopedics;  Laterality: Left;    Allergies  Allergen Reactions  . Metformin And Related Diarrhea    Allergies as of  06/19/2020      Reactions   Metformin And Related Diarrhea      Medication List       Accurate as of June 19, 2020 11:59 PM. If you have any questions, ask your nurse or doctor.        acetaminophen 325 MG tablet Commonly known as: TYLENOL Take 650 mg by mouth every 8 (eight) hours as needed.   atorvastatin 10 MG tablet Commonly known as: LIPITOR Take 10 mg by mouth daily.   Blood Glucose Monitoring Suppl Kit Use to test blood sugar once daily daily. Dx: E11.69   Boost Glucose Control Liqd Take by mouth. 1 can  three times a day between meals   diphenhydrAMINE 25 mg capsule Commonly known as: BENADRYL Take 25 mg by mouth 2 (two) times daily as needed.   divalproex 125 MG DR tablet Commonly known as: DEPAKOTE Take 125 mg by mouth 2 (two) times daily.   docusate sodium 100 MG capsule Commonly known as: COLACE Take 100 mg by mouth daily. What changed: Another medication with the same name was removed. Continue taking this medication, and follow the directions you see here. Changed by: [name], MD   glimepiride 1 MG tablet Commonly known as: AMARYL Take 1 mg by mouth daily with breakfast.   glucose blood test strip Use to test blood sugar once daily. Dx: E11.69   Lancets 30G Misc Use to test blood sugar once daily. Dx: E11.69   Lancets Misc. Kit 1 Device by Does not apply route daily. Use as directed   levothyroxine 75 MCG tablet Commonly known as: SYNTHROID TAKE 1 TABLET (75 MCG TOTAL) BY MOUTH DAILY BEFORE BREAKFAST.   LORazepam 0.5 MG tablet Commonly known as: ATIVAN Take 0.5 mg by mouth 2 (two) times daily as needed for anxiety.   losartan 25 MG tablet Commonly known as: COZAAR Take 25 mg by mouth daily.   methocarbamol 500 MG tablet Commonly known as: ROBAXIN Take 250 mg by mouth every 8 (eight) hours as needed for muscle spasms.   metoprolol tartrate 25 MG tablet Commonly known as: LOPRESSOR Take 12.5 mg by mouth 2 (two) times daily.   oxyCODONE 5 MG immediate release tablet Commonly known as: Oxy IR/ROXICODONE Take 1 tablet (5 mg total) by mouth every 6 (six) hours as needed for moderate pain or severe pain.   polyethylene glycol 17 g packet Commonly known as: MIRALAX / GLYCOLAX Take 17 g by mouth daily as needed for moderate constipation.   QUEtiapine 25 MG tablet Commonly known as: SEROQUEL Take 25 mg by mouth at bedtime.   sertraline 25 MG tablet Commonly known as: ZOLOFT Take 50 mg by mouth daily.   Vitamin D3 50 MCG (2000 UT) Tabs Take 1  tablet by mouth daily.   zinc oxide 20 % ointment Apply 1 application topically as needed for irritation.       Review of Systems  Denies everything says she is going home now and has everything ready.  Immunization History  Administered Date(s) Administered  . DTaP 07/20/2013  . Influenza, High Dose Seasonal PF 12/07/2016  . Influenza,inj,Quad PF,6+ Mos 12/01/2017  . Influenza-Unspecified 11/20/2014, 12/12/2015, 11/20/2019  . Moderna Sars-Covid-2 Vaccination 03/05/2019, 04/02/2019  . PPD Test 08/06/2014  . Pneumococcal Conjugate-13 11/07/2013  . Pneumococcal Polysaccharide-23 03/22/2017  . Zoster 03/01/2006  . Zoster Recombinat (Shingrix) 06/07/2017, 08/26/2017   Pertinent  Health Maintenance Due  Topic Date Due  . OPHTHALMOLOGY EXAM  Never done  .  FOOT EXAM  07/10/2020  . INFLUENZA VACCINE  09/29/2020  . HEMOGLOBIN A1C  10/05/2020  . DEXA SCAN  Completed  . PNA vac Low Risk Adult  Completed   Fall Risk  12/19/2019 10/17/2019 08/22/2019 07/11/2019 06/13/2019  Falls in the past year? 0 0 0 0 0  Number falls in past yr: 0 0 0 0 0  Injury with Fall? - - 0 - -   Functional Status Survey:    Vitals:   06/19/20 1033  BP: 131/63  Pulse: 91  Resp: 20  Temp: (!) 97.3 F (36.3 C)  SpO2: 96%  Weight: 138 lb 3.2 oz (62.7 kg)  Height: 5' (1.524 m)   Body mass index is 26.99 kg/m. Physical Exam Vitals reviewed.  Constitutional:      Appearance: Normal appearance.  HENT:     Head: Normocephalic.     Nose: Nose normal.     Mouth/Throat:     Mouth: Mucous membranes are moist.     Pharynx: Oropharynx is clear.  Eyes:     Pupils: Pupils are equal, round, and reactive to light.  Cardiovascular:     Rate and Rhythm: Normal rate and regular rhythm.     Pulses: Normal pulses.     Heart sounds: Normal heart sounds.  Pulmonary:     Effort: Pulmonary effort is normal.     Breath sounds: Normal breath sounds.  Abdominal:     General: Abdomen is flat. Bowel sounds are  normal.     Palpations: Abdomen is soft.  Musculoskeletal:        General: No swelling.     Cervical back: Neck supple.  Skin:    General: Skin is warm.  Neurological:     General: No focal deficit present.     Mental Status: She is alert.  Psychiatric:        Mood and Affect: Mood normal.        Thought Content: Thought content normal.     Labs reviewed: Recent Labs    04/10/20 0155 04/11/20 0136 04/14/20 0354 04/17/20 0000 05/01/20 0000  NA 138 141 142 142 141  K 3.9 3.5 3.4* 3.7 4.2  CL 104 108 103 107 106  CO2 '23 23 25 ' 26* 26*  GLUCOSE 150* 106* 101*  --   --   BUN '12 16 17 17 ' 23*  CREATININE 0.62 0.57 0.61 0.5 0.5  CALCIUM 8.5* 8.0* 8.3* 8.0* 9.0   Recent Labs    08/16/19 0800 12/13/19 0000 01/10/20 0000 04/17/20 0000 05/01/20 0000  AST '14 15 18 16 13  ' ALT '14 11 17 11 11  ' ALKPHOS  --   --  124 74 127*  BILITOT 0.4 0.4  --   --   --   PROT 6.4 6.5  --   --   --   ALBUMIN  --   --  3.5 2.6* 3.5   Recent Labs    04/07/20 1921 04/08/20 0410 04/11/20 0136 04/12/20 0200 04/12/20 1841 04/14/20 0354 05/01/20 0000 06/09/20 0000  WBC 14.1*   < > 10.2 9.0  --  11.0* 7.9 9.8  NEUTROABS 10.0*  --   --   --   --   --  4,471.00 5,772.00  HGB 11.3*   < > 8.1* 7.5*   < > 9.4* 11.2* 12.1  HCT 35.1*   < > 26.2* 23.4*   < > 30.9* 35* 38  MCV 82.2   < > 84.8 83.9  --  85.8  --   --   PLT 341   < > 276 270  --  317 322 299   < > = values in this interval not displayed.   Lab Results  Component Value Date   TSH 3.509 12/29/2019   Lab Results  Component Value Date   HGBA1C 5.5 04/07/2020   Lab Results  Component Value Date   CHOL 95 04/17/2020   HDL 22 (A) 04/17/2020   LDLCALC 50 04/17/2020   TRIG 150 04/17/2020   CHOLHDL 3.0 12/13/2019    Significant Diagnostic Results in last 30 days:  XR FEMUR MIN 2 VIEWS LEFT  Result Date: 06/17/2020 Stable fixation and healed left intertrochanteric fracture.  XR FEMUR MIN 2 VIEWS LEFT  Result Date:  05/27/2020 X-rays demonstrate stable alignment of the fracture without hardware complication  XR Wrist Complete Left  Result Date: 06/17/2020 Healed distal radius fracture with bony consolidation.  XR Wrist 2 Views Left  Result Date: 05/27/2020 X-rays demonstrate stable alignment of fracture with considerable callus formation   Assessment/Plan  Vascular dementia with behavior disturbance  Will increase her Depakote to 125 mg BID Continue Seroquel 25 mg Daughter does not want me to increase the dose of Seroquel  yet Also use Ativan Prn Daughter wants to visit Memory unit before making the decision for her to move   Closed fracture of left hip with routine healing,  Doing well Now walking with Assist Getting more stronger  closed fracture of distal end of left radius  Pain Controlled On Temporary Brace Depression with anxiety Continue Zoloft Frequent falls Continues to be high risk now that she is more Mobile Diabetes mellitus type 2 in nonobese (HCC) On AMaryl Mixed hyperlipidemia On statin Hypothyroidism, unspecified type TSH normal Primary hypertension On Cozaar and Lopressor Post op Anemia HGB normal  Family/ staff Communication:   Labs/tests ordered:   Total time spent in this patient care encounter was  45_  minutes; greater than 50% of the visit spent counseling patient and staff, reviewing records , Labs and coordinating care for problems addressed at this encounter.

## 2020-06-20 ENCOUNTER — Telehealth: Payer: Self-pay | Admitting: Orthopaedic Surgery

## 2020-06-20 NOTE — Telephone Encounter (Signed)
WBAT

## 2020-06-20 NOTE — Telephone Encounter (Signed)
Please advise 

## 2020-06-20 NOTE — Telephone Encounter (Signed)
Received call from [place] with [facility] needing weight bearing status for patient's left wrist.   The number to contact Alyse Low is [phone]     Fax # is [phone]

## 2020-06-23 NOTE — Telephone Encounter (Signed)
Called and advised.

## 2020-06-26 ENCOUNTER — Non-Acute Institutional Stay (SKILLED_NURSING_FACILITY): Payer: Medicare Other | Admitting: Internal Medicine

## 2020-06-26 DIAGNOSIS — F01518 Vascular dementia, unspecified severity, with other behavioral disturbance: Secondary | ICD-10-CM

## 2020-06-26 DIAGNOSIS — F0151 Vascular dementia with behavioral disturbance: Secondary | ICD-10-CM | POA: Diagnosis not present

## 2020-06-27 ENCOUNTER — Encounter: Payer: Self-pay | Admitting: Internal Medicine

## 2020-06-27 NOTE — Progress Notes (Signed)
Location: [facility] features editor of Service:  SNF ([id number])  Provider:   Code Status: DNR Goals of Care:  Advanced Directives 06/06/2020  Does Patient Have a Medical Advance Directive? Yes  Type of Paramedic of [place];Living will;Out of facility DNR (pink MOST or yellow form)  Does patient want to make changes to medical advance directive? No - Patient declined  Copy of [place] in Chart? Yes - validated most recent copy scanned in chart (See row information)  Would patient like information on creating a medical advance directive? -  Pre-existing out of facility DNR order (yellow form or pink MOST form) Yellow form placed in chart (order not valid for inpatient use)     Chief Complaint  Patient presents with  . Acute Visit    HPI: [name] a 87 y.o. female seen today for an acute visit for Continued Behavior Issues with Risk for Elopement and Falls  Admitted to hospitalfrom2/7-2/14for left hip fracture s/p ORIF on2/9and fracture of lower end of radius s/preduction Patient has a history of type 2 diabetes, hyperlipidemia, depression, dementia, hypothyroidism, osteopenia, stress and urge urinary incontinence, Tachycardia Patient was admitted in the hospital from 10/27-10/31 with L4 burst fracture after the fall  Patient continues to have behavior issues in the evening.  . She does have sensor to her leg.  Per nurses she screams and yells and goes into other patient's room.  She tells them that she is ready to go back to [facility]. nurses have to call her daughter to calm her down.  She has tried to escape couple of times since she has become more stronger.  Continues to be a very high risk for falls. Her daughter was here today in the room.  Patient very calm.  She said 'Are you all talking about me.  I am doing fine.'  No complaints.  When I asked her why she calls 911 she says it is  just her mistake.  She has already told me  before  that she is packing and going back home.   Past Medical History:  Diagnosis Date  . Hearing loss   . History of fracture of clavicle   . Hypothyroidism   . Insomnia   . Tinnitus     Past Surgical History:  Procedure Laterality Date  . CHOLECYSTECTOMY  2007   Dr. Verdene Lennert  . CLAVICLE EXCISION  1986  . INTRAMEDULLARY (IM) NAIL INTERTROCHANTERIC Left 04/09/2020   Procedure: LEFT INTERTROCHANTERIC INTRAMEDULLARY (IM) NAIL;  Surgeon: Leandrew Koyanagi, MD;  Location: [facility];  Service: Orthopedics;  Laterality: Left;  . MOHS SURGERY     past 10 years chest & legs  . OPEN REDUCTION INTERNAL FIXATION (ORIF) DISTAL RADIAL FRACTURE Left 04/09/2020   Procedure: CLOSED REDUCTION LEFT DISTAL RADIUS FRACTURE;  Surgeon: Leandrew Koyanagi, MD;  Location: [facility];  Service: Orthopedics;  Laterality: Left;    Allergies  Allergen Reactions  . Metformin And Related Diarrhea    Outpatient Encounter Medications as of 06/26/2020  Medication Sig  . acetaminophen (TYLENOL) 325 MG tablet Take 650 mg by mouth every 8 (eight) hours as needed.  Marland Kitchen atorvastatin (LIPITOR) 10 MG tablet Take 10 mg by mouth daily.  . Blood Glucose Monitoring Suppl KIT Use to test blood sugar once daily daily. Dx: E11.69  . Cholecalciferol (VITAMIN D3) 2000 units TABS Take 1 tablet by mouth daily.   . divalproex (DEPAKOTE) 125 MG DR tablet Take 125 mg  by mouth 2 (two) times daily.  Marland Kitchen docusate sodium (COLACE) 100 MG capsule Take 100 mg by mouth daily.  Marland Kitchen glimepiride (AMARYL) 1 MG tablet Take 1 mg by mouth daily with breakfast.  . glucose blood test strip Use to test blood sugar once daily. Dx: E11.69  . Lancets 30G MISC Use to test blood sugar once daily. Dx: E11.69  . Lancets Misc. KIT 1 Device by Does not apply route daily. Use as directed  . levothyroxine (SYNTHROID) 75 MCG tablet TAKE 1 TABLET (75 MCG TOTAL) BY MOUTH DAILY BEFORE BREAKFAST.  Marland Kitchen LORazepam (ATIVAN) 0.5 MG tablet Take 0.5 mg by mouth 2 (two) times daily as needed for  anxiety.  Marland Kitchen losartan (COZAAR) 25 MG tablet Take 25 mg by mouth daily.  . methocarbamol (ROBAXIN) 500 MG tablet Take 250 mg by mouth every 8 (eight) hours as needed for muscle spasms.  . metoprolol tartrate (LOPRESSOR) 25 MG tablet Take 12.5 mg by mouth 2 (two) times daily.  . Nutritional Supplements (BOOST GLUCOSE CONTROL) LIQD Take by mouth. 1 can three times a day between meals  . oxyCODONE (OXY IR/ROXICODONE) 5 MG immediate release tablet Take 1 tablet (5 mg total) by mouth every 6 (six) hours as needed for moderate pain or severe pain.  . polyethylene glycol (MIRALAX / GLYCOLAX) 17 g packet Take 17 g by mouth daily as needed for moderate constipation.  . QUEtiapine (SEROQUEL) 25 MG tablet Take 25 mg by mouth at bedtime.  . sertraline (ZOLOFT) 25 MG tablet Take 50 mg by mouth daily.   Marland Kitchen zinc oxide 20 % ointment Apply 1 application topically as needed for irritation.   No facility-administered encounter medications on file as of 06/26/2020.    Review of Systems:  Review of Systems  Constitutional: Positive for activity change.  HENT: Negative.   Respiratory: Negative.   Cardiovascular: Negative.   Gastrointestinal: Negative.   Genitourinary: Negative.   Musculoskeletal: Positive for gait problem.  Skin: Negative.   Neurological: Positive for weakness.  Psychiatric/Behavioral: Positive for behavioral problems and confusion. The patient is nervous/anxious.     Health Maintenance  Topic Date Due  . OPHTHALMOLOGY EXAM  Never done  . COVID-19 Vaccine (3 - Booster for Moderna series) 09/30/2019  . TETANUS/TDAP  11/24/2026 (Originally 08/19/1947)  . FOOT EXAM  07/10/2020  . INFLUENZA VACCINE  09/29/2020  . HEMOGLOBIN A1C  10/05/2020  . DEXA SCAN  Completed  . PNA vac Low Risk Adult  Completed  . HPV VACCINES  Aged Out    Physical Exam: Vitals:   06/27/20 0931  BP: 115/66  Pulse: 97  Resp: 16  Temp: 97.6 F (36.4 C)  SpO2: 97%   There is no height or weight on file to  calculate BMI. Physical Exam Vitals reviewed.  Constitutional:      Appearance: Normal appearance.  HENT:     Head: Normocephalic.     Nose: Nose normal.     Mouth/Throat:     Mouth: Mucous membranes are moist.     Pharynx: Oropharynx is clear.  Eyes:     Pupils: Pupils are equal, round, and reactive to light.  Cardiovascular:     Rate and Rhythm: Normal rate and regular rhythm.     Pulses: Normal pulses.     Heart sounds: Normal heart sounds.  Pulmonary:     Effort: Pulmonary effort is normal.     Breath sounds: Normal breath sounds.  Abdominal:     General: Abdomen is flat.  Bowel sounds are normal.     Palpations: Abdomen is soft.  Musculoskeletal:        General: No swelling.     Cervical back: Neck supple.  Skin:    General: Skin is warm.  Neurological:     General: No focal deficit present.     Mental Status: She is alert.  Psychiatric:        Mood and Affect: Mood normal.        Thought Content: Thought content normal.        Judgment: Judgment normal.     Labs reviewed: Basic Metabolic Panel: Recent Labs    12/29/19 0606 01/10/20 0000 04/10/20 0155 04/11/20 0136 04/14/20 0354 04/17/20 0000 05/01/20 0000  NA  --    < > 138 141 142 142 141  K  --    < > 3.9 3.5 3.4* 3.7 4.2  CL  --    < > 104 108 103 107 106  CO2  --    < > _0 26* 26*  GLUCOSE  --    < > 150* 106* 101*  --   --   BUN  --    < > _1 23*  CREATININE  --    < > 0.62 0.57 0.61 0.5 0.5  CALCIUM  --    < > 8.5* 8.0* 8.3* 8.0* 9.0  TSH 3.509  --   --   --   --   --   --    < > = values in this interval not displayed.   Liver Function Tests: Recent Labs    08/16/19 0800 12/13/19 0000 01/10/20 0000 04/17/20 0000 05/01/20 0000  AST _2 ALT _3 ALKPHOS  --   --  124 74 127*  BILITOT 0.4 0.4  --   --   --   PROT 6.4 6.5  --   --   --   ALBUMIN  --   --  3.5 2.6* 3.5   No results for input(s): LIPASE, AMYLASE in the last 8760 hours. No results  for input(s): AMMONIA in the last 8760 hours. CBC: Recent Labs    04/07/20 1921 04/08/20 0410 04/11/20 0136 04/12/20 0200 04/12/20 1841 04/14/20 0354 05/01/20 0000 06/09/20 0000  WBC 14.1*   < > 10.2 9.0  --  11.0* 7.9 9.8  NEUTROABS 10.0*  --   --   --   --   --  4,471.00 5,772.00  HGB 11.3*   < > 8.1* 7.5*   < > 9.4* 11.2* 12.1  HCT 35.1*   < > 26.2* 23.4*   < > 30.9* 35* 38  MCV 82.2   < > 84.8 83.9  --  85.8  --   --   PLT 341   < > 276 270  --  317 322 299   < > = values in this interval not displayed.   Lipid Panel: Recent Labs    12/13/19 0000 04/17/20 0000  CHOL 109 95  HDL 36* 22*  LDLCALC 51 50  TRIG 132 150  CHOLHDL 3.0  --    Lab Results  Component Value Date   HGBA1C 5.5 04/07/2020    Procedures since last visit: XR FEMUR MIN 2 VIEWS LEFT  Result Date: 06/17/2020 Stable fixation and healed left intertrochanteric fracture.  XR Wrist Complete Left  Result Date: 06/17/2020 Healed distal radius fracture with bony  consolidation.   Assessment/Plan Vascular dementia with behavior disturbance ([place]) Patient on Depakote,Seroquel and Zoloft Ativan PRn Continues to be high risk for fall Continues to have Sundowning and Behavior issues Patient also risk for Elopement D/w DON and Nursing staff. She is Appropriate for  Memory Unit in [place] Daughter still not sure about moving her to another Facility. She feels her mom knows the staff here and she is worried that she would not like it there. I have convinced her to go and Tour the facility. Will have to make decision by Next week  Other issues   Closed fracture of left hip with routine healing,  Doing well Now walking with Assist Getting more stronger  closed fracture of distal end of left radius  Pain Controlled On Temporary Brace Depression with anxiety Continue Zoloft Frequent falls Continues to be high risk now that she is more Mobile Diabetes mellitus type 2 in nonobese (HCC) On  AMaryl Mixed hyperlipidemia On statin Hypothyroidism, unspecified type TSH normal Primary hypertension On Cozaar and Lopressor Post op Anemia HGB slightly low but stable  Labs/tests ordered:  * No order type specified * Next appt:  Visit date not found

## 2020-07-17 LAB — HEPATIC FUNCTION PANEL
ALT: 7 (ref 7–35)
AST: 12 — AB (ref 13–35)
Alkaline Phosphatase: 79 (ref 25–125)
Bilirubin, Total: 0.3

## 2020-07-17 LAB — BASIC METABOLIC PANEL
BUN: 23 — AB (ref 4–21)
CO2: 27 — AB (ref 13–22)
Chloride: 107 (ref 99–108)
Creatinine: 0.6 (ref <=1.1)
Glucose: 76
Potassium: 4.2 (ref 3.4–5.3)
Sodium: 143 (ref 137–147)

## 2020-07-17 LAB — COMPREHENSIVE METABOLIC PANEL
Albumin: 3.3 — AB (ref 3.5–5.0)
Calcium: 8.7 (ref 8.7–10.7)
GFR calc Af Amer: 93
GFR calc non Af Amer: 80
Globulin: 2.2

## 2020-07-17 LAB — CBC AND DIFFERENTIAL
HCT: 36 (ref 36–46)
Hemoglobin: 11.3 — AB (ref 12.0–16.0)
Neutrophils Absolute: 5517
Platelets: 258 (ref 150–399)
WBC: 9

## 2020-07-17 LAB — CBC: RBC: 4.23 (ref 3.87–5.11)

## 2020-07-23 ENCOUNTER — Encounter: Payer: Self-pay | Admitting: Orthopedic Surgery

## 2020-07-23 ENCOUNTER — Non-Acute Institutional Stay (SKILLED_NURSING_FACILITY): Payer: Medicare Other | Admitting: Orthopedic Surgery

## 2020-07-23 DIAGNOSIS — M6281 Muscle weakness (generalized): Secondary | ICD-10-CM | POA: Diagnosis not present

## 2020-07-23 DIAGNOSIS — S72002D Fracture of unspecified part of neck of left femur, subsequent encounter for closed fracture with routine healing: Secondary | ICD-10-CM | POA: Diagnosis not present

## 2020-07-23 DIAGNOSIS — F01518 Vascular dementia, unspecified severity, with other behavioral disturbance: Secondary | ICD-10-CM

## 2020-07-23 DIAGNOSIS — E039 Hypothyroidism, unspecified: Secondary | ICD-10-CM | POA: Diagnosis not present

## 2020-07-23 DIAGNOSIS — R634 Abnormal weight loss: Secondary | ICD-10-CM

## 2020-07-23 DIAGNOSIS — I10 Essential (primary) hypertension: Secondary | ICD-10-CM

## 2020-07-23 DIAGNOSIS — F418 Other specified anxiety disorders: Secondary | ICD-10-CM

## 2020-07-23 DIAGNOSIS — H6122 Impacted cerumen, left ear: Secondary | ICD-10-CM | POA: Diagnosis not present

## 2020-07-23 DIAGNOSIS — Z9181 History of falling: Secondary | ICD-10-CM | POA: Diagnosis not present

## 2020-07-23 DIAGNOSIS — F0151 Vascular dementia with behavioral disturbance: Secondary | ICD-10-CM | POA: Diagnosis not present

## 2020-07-23 DIAGNOSIS — K5901 Slow transit constipation: Secondary | ICD-10-CM | POA: Diagnosis not present

## 2020-07-23 DIAGNOSIS — R296 Repeated falls: Secondary | ICD-10-CM

## 2020-07-23 DIAGNOSIS — E782 Mixed hyperlipidemia: Secondary | ICD-10-CM | POA: Diagnosis not present

## 2020-07-23 DIAGNOSIS — E119 Type 2 diabetes mellitus without complications: Secondary | ICD-10-CM

## 2020-07-23 NOTE — Progress Notes (Signed)
Location:   [facility] Room Number: [id number] Place of Service:  SNF [phone]) Provider:  Windell Moulding, NP  [name], MD  Patient Care Team: [name], MD as PCP - General (Internal Medicine)  Extended Emergency Contact Information Primary Emergency Contact: Harris,Margaret Address: [address]          [place], [place] [address] [name] of [place] Phone: [phone] Mobile Phone: [phone] Relation: Daughter  Code Status:  DNR Goals of care: Advanced Directive information Advanced Directives 07/23/2020  Does Patient Have a Medical Advance Directive? Yes  Type of Paramedic of [place];Living will;Out of facility DNR (pink MOST or yellow form)  Does patient want to make changes to medical advance directive? No - Patient declined  Copy of [place] in Chart? Yes - validated most recent copy scanned in chart (See row information)  Would patient like information on creating a medical advance directive? -  Pre-existing out of facility DNR order (yellow form or pink MOST form) Yellow form placed in chart (order not valid for inpatient use)     Chief Complaint  Patient presents with  . Medical Management of Chronic Issues    Routine follow up visit.  . Quality Metric Gaps    Eye exam, foot exam  . Best Practice Recommendations    COVID booster    HPI:  Pt is a 87 y.o. female seen today for medical management of chronic diseases.    She resides on the skilled nursing unit at [facility] due to dementia and mobility issues. Past medical history includes: hypertension, hyperlipidemia,hypothyroidism, constipation, diabetes, stress and urge incontinence, depression, anxiety, and gait abnormality.     Vascular dementia, remains on Depakote 125 mg bid, ativan 0.5 bid prn, and Seroquel 25 mg qhs. ALT/AST 7/12 07/17/2020.  some minor incidents of agitation noted, she was able to be redirected by staff.  Wander guard in place. She is appropriate for memory care placement, daughter hesitant to move her to another environment.  Hypothyroidism remains stable with levothyroxine 75 mcg daily , TSH 3.509 12/29/2019.  Constipation, regular bowel movements with daily colace and prn miralax.  Hypertension controlled with losartan 25 mg daily and lopressor 12.5 mg bid, BUN/creat 23/0.6.  Hyperlipidemia stable with Lipitor 10 mg daily, LDL 50 04/17/2020. Depression stable with zoloft 25 mg daily.  Diabetes, A1c 5.5 04/17/2020, remains on amaryl 1 mg daily. No hypoglycemia events.   No recent falls or injuries. Ambulates with wheelchair. She has been found to get up on her own to use bathroom. Remains WBAT. Hx of left hip fracture 02/07-02/14- s/p left hip IM nail by Dr. Erlinda Hong.   Recent blood pressures are as follows:    05/24- 104/64  05/17- 121/63  05/10- 122/80  Recent weights:  05/18- 136.4 lbs  04/01- 138.2 lbs  03/01- 137.6 lbs  02/15- 153 lbs  Nurse does not report any concerns, vitals stable.    Past Medical History:  Diagnosis Date  . Hearing loss   . History of fracture of clavicle   . Hypothyroidism   . Insomnia   . Tinnitus    Past Surgical History:  Procedure Laterality Date  . CHOLECYSTECTOMY  2007   Dr. Verdene Lennert  . CLAVICLE EXCISION  1986  . INTRAMEDULLARY (IM) NAIL INTERTROCHANTERIC Left 04/09/2020   Procedure: LEFT INTERTROCHANTERIC INTRAMEDULLARY (IM) NAIL;  Surgeon: Leandrew Koyanagi, MD;  Location: [facility];  Service: Orthopedics;  Laterality: Left;  .  MOHS SURGERY     past 10 years chest & legs  . OPEN REDUCTION INTERNAL FIXATION (ORIF) DISTAL RADIAL FRACTURE Left 04/09/2020   Procedure: CLOSED REDUCTION LEFT DISTAL RADIUS FRACTURE;  Surgeon: Leandrew Koyanagi, MD;  Location: [facility];  Service: Orthopedics;  Laterality: Left;    Allergies  Allergen Reactions  . Metformin And Related Diarrhea    Allergies as of 07/23/2020      Reactions   Metformin And Related Diarrhea       Medication List       Accurate as of Jul 23, 2020 10:58 AM. If you have any questions, ask your nurse or doctor.        STOP taking these medications   Blood Glucose Monitoring Suppl Kit Stopped by: Yvonna Alanis, NP   Boost Glucose Control Liqd Stopped by: Yvonna Alanis, NP   glucose blood test strip Stopped by: Yvonna Alanis, NP   Lancets 30G Misc Stopped by: Yvonna Alanis, NP   Lancets Misc. Kit Stopped by: Yvonna Alanis, NP   oxyCODONE 5 MG immediate release tablet Commonly known as: Oxy IR/ROXICODONE Stopped by: Yvonna Alanis, NP     TAKE these medications   acetaminophen 325 MG tablet Commonly known as: TYLENOL Take 650 mg by mouth every 8 (eight) hours as needed.   atorvastatin 10 MG tablet Commonly known as: LIPITOR Take 10 mg by mouth daily.   divalproex 125 MG DR tablet Commonly known as: DEPAKOTE Take 125 mg by mouth 2 (two) times daily.   docusate sodium 100 MG capsule Commonly known as: COLACE Take 100 mg by mouth daily.   glimepiride 1 MG tablet Commonly known as: AMARYL Take 1 mg by mouth daily with breakfast.   levothyroxine 75 MCG tablet Commonly known as: SYNTHROID TAKE 1 TABLET (75 MCG TOTAL) BY MOUTH DAILY BEFORE BREAKFAST.   LORazepam 0.5 MG tablet Commonly known as: ATIVAN Take 0.5 mg by mouth 2 (two) times daily. PRN x 14 days for extreme agitation/anxiety.   losartan 25 MG tablet Commonly known as: COZAAR Take 25 mg by mouth daily.   methocarbamol 500 MG tablet Commonly known as: ROBAXIN Take 250 mg by mouth every 8 (eight) hours as needed for muscle spasms.   metoprolol tartrate 25 MG tablet Commonly known as: LOPRESSOR Take 12.5 mg by mouth 2 (two) times daily.   polyethylene glycol 17 g packet Commonly known as: MIRALAX / GLYCOLAX Take 17 g by mouth daily as needed for moderate constipation.   QUEtiapine 25 MG tablet Commonly known as: SEROQUEL Take 25 mg by mouth at bedtime.   sertraline 25 MG tablet Commonly known as:  ZOLOFT Take 50 mg by mouth daily.   Vitamin D3 50 MCG (2000 UT) Tabs Take 1 tablet by mouth daily.   zinc oxide 20 % ointment Apply 1 application topically as needed for irritation.       Review of Systems  Unable to perform ROS: Dementia    Immunization History  Administered Date(s) Administered  . DTaP 07/20/2013  . Influenza, High Dose Seasonal PF 12/07/2016  . Influenza,inj,Quad PF,6+ Mos 12/01/2017  . Influenza-Unspecified 11/20/2014, 12/12/2015, 11/20/2019  . Moderna Sars-Covid-2 Vaccination 03/05/2019, 04/02/2019  . PPD Test 08/06/2014  . Pneumococcal Conjugate-13 11/07/2013  . Pneumococcal Polysaccharide-23 03/22/2017  . Zoster 03/01/2006  . Zoster Recombinat (Shingrix) 06/07/2017, 08/26/2017   Pertinent  Health Maintenance Due  Topic Date Due  . OPHTHALMOLOGY EXAM  Never done  . FOOT EXAM  07/10/2020  . INFLUENZA VACCINE  09/29/2020  . HEMOGLOBIN A1C  10/05/2020  . DEXA SCAN  Completed  . PNA vac Low Risk Adult  Completed   Fall Risk  12/19/2019 10/17/2019 08/22/2019 07/11/2019 06/13/2019  Falls in the past year? 0 0 0 0 0  Number falls in past yr: 0 0 0 0 0  Injury with Fall? - - 0 - -   Functional Status Survey:    Vitals:   07/23/20 1038  BP: 104/64  Pulse: 84  Resp: 20  Temp: (!) 96.8 F (36 C)  SpO2: 95%  Weight: 136 lb 6.4 oz (61.9 kg)  Height: 5' (1.524 m)   Body mass index is 26.64 kg/m. Physical Exam Vitals reviewed.  Constitutional:      General: She is not in acute distress. HENT:     Head: Normocephalic.     Right Ear: There is no impacted cerumen.     Left Ear: There is impacted cerumen.     Ears:     Comments: Hearing aid to right ear    Nose: Nose normal.     Mouth/Throat:     Mouth: Mucous membranes are moist.     Pharynx: No posterior oropharyngeal erythema.  Eyes:     General:        Right eye: No discharge.        Left eye: No discharge.  Neck:     Thyroid: No thyroid mass or thyromegaly.  Cardiovascular:     Rate  and Rhythm: Normal rate and regular rhythm.     Pulses:          Dorsalis pedis pulses are 1+ on the right side and 1+ on the left side.     Heart sounds: Normal heart sounds. No murmur heard.   Pulmonary:     Effort: Pulmonary effort is normal. No respiratory distress.     Breath sounds: Normal breath sounds. No wheezing.  Abdominal:     General: Bowel sounds are normal. There is no distension.     Palpations: Abdomen is soft.     Tenderness: There is no abdominal tenderness.  Musculoskeletal:     Cervical back: Normal range of motion.     Right lower leg: Edema present.     Left lower leg: Edema present.     Comments: Non-pitting  Feet:     Right foot:     Protective Sensation: 8 sites tested. 10 sites sensed.     Skin integrity: No skin breakdown.     Toenail Condition: Right toenails are normal.     Left foot:     Protective Sensation: 8 sites tested. 10 sites sensed.     Skin integrity: No skin breakdown.     Toenail Condition: Left toenails are normal.  Lymphadenopathy:     Cervical: No cervical adenopathy.  Skin:    General: Skin is warm and dry.     Capillary Refill: Capillary refill takes less than 2 seconds.  Neurological:     General: No focal deficit present.     Mental Status: She is alert. Mental status is at baseline.     Motor: Weakness present.     Gait: Gait abnormal.     Comments: wheelchair  Psychiatric:        Mood and Affect: Mood normal.        Behavior: Behavior normal.        Cognition and Memory: Memory is impaired.  Labs reviewed: Recent Labs    04/10/20 0155 04/11/20 0136 04/14/20 0354 04/17/20 0000 05/01/20 0000  NA 138 141 142 142 141  K 3.9 3.5 3.4* 3.7 4.2  CL 104 108 103 107 106  CO2 '23 23 25 ' 26* 26*  GLUCOSE 150* 106* 101*  --   --   BUN '12 16 17 17 ' 23*  CREATININE 0.62 0.57 0.61 0.5 0.5  CALCIUM 8.5* 8.0* 8.3* 8.0* 9.0   Recent Labs    08/16/19 0800 12/13/19 0000 01/10/20 0000 04/17/20 0000 05/01/20 0000  AST  '14 15 18 16 13  ' ALT '14 11 17 11 11  ' ALKPHOS  --   --  124 74 127*  BILITOT 0.4 0.4  --   --   --   PROT 6.4 6.5  --   --   --   ALBUMIN  --   --  3.5 2.6* 3.5   Recent Labs    04/07/20 1921 04/08/20 0410 04/11/20 0136 04/12/20 0200 04/12/20 1841 04/14/20 0354 05/01/20 0000 06/09/20 0000  WBC 14.1*   < > 10.2 9.0  --  11.0* 7.9 9.8  NEUTROABS 10.0*  --   --   --   --   --  4,471.00 5,772.00  HGB 11.3*   < > 8.1* 7.5*   < > 9.4* 11.2* 12.1  HCT 35.1*   < > 26.2* 23.4*   < > 30.9* 35* 38  MCV 82.2   < > 84.8 83.9  --  85.8  --   --   PLT 341   < > 276 270  --  317 322 299   < > = values in this interval not displayed.   Lab Results  Component Value Date   TSH 3.509 12/29/2019   Lab Results  Component Value Date   HGBA1C 5.5 04/07/2020   Lab Results  Component Value Date   CHOL 95 04/17/2020   HDL 22 (A) 04/17/2020   LDLCALC 50 04/17/2020   TRIG 150 04/17/2020   CHOLHDL 3.0 12/13/2019    Significant Diagnostic Results in last 30 days:  No results found.  Assessment/Plan 1. Left ear impacted cerumen - cannot visualize TM - debrox 5 gtts qhs x 7 days - flush ear with warm water on day 8  2. Vascular dementia with behavior disturbance ([place]) - no recent behavioral outbursts - some agitation, but redirected by staff - appropriate for memory care unit, daughter hesitant to change her environment - cont depakote 125 mg bid - cont seroquel 25 mg qhs - cont ativan 0.5 mg bid prn - cont skilled nursing care - cont wander guard  3. Depression with anxiety - stable with zoloft 25 mg daily - no recent panic attacks - cont ativan 0.5 mg po bid prn  4. Frequent falls - no recent falls  - Left hip fracture 04/2020 - cont falls safety precautions  5. Diabetes mellitus type 2 in nonobese (HCC) - A1c 5.5 04/17/2020 - no hypoglycemic events - cont Amaryl 1 mg po daily - A1c- future - foot exam with 8/10 sites detected - schedule diabetic eye exam  6. Mixed  hyperlipidemia - LDL 50 11/2019 - cont lipitor 10 mg daily  7. Hypothyroidism, unspecified type - TSH 3.509 11/2019 -cont levothyroxine 75 mcg daily  8. Weight loss - she lost about 20 lbs since left hip fracture - she has maintained weight in last 3 months - may consider adding Glucerna in future   9. Primary  hypertension - controlled  - cont losartan and metoprolol  10. Slow transit constipation - stable with colace and prn miralax    Family/ staff Communication: plan discussed with patient and nurse  Labs/tests ordered: diabetic eye exam

## 2020-07-29 DIAGNOSIS — Z9181 History of falling: Secondary | ICD-10-CM | POA: Diagnosis not present

## 2020-07-29 DIAGNOSIS — M6281 Muscle weakness (generalized): Secondary | ICD-10-CM | POA: Diagnosis not present

## 2020-07-29 DIAGNOSIS — S72002D Fracture of unspecified part of neck of left femur, subsequent encounter for closed fracture with routine healing: Secondary | ICD-10-CM | POA: Diagnosis not present

## 2020-07-31 DIAGNOSIS — I10 Essential (primary) hypertension: Secondary | ICD-10-CM | POA: Diagnosis not present

## 2020-07-31 DIAGNOSIS — E119 Type 2 diabetes mellitus without complications: Secondary | ICD-10-CM | POA: Diagnosis not present

## 2020-07-31 DIAGNOSIS — N83202 Unspecified ovarian cyst, left side: Secondary | ICD-10-CM | POA: Diagnosis not present

## 2020-07-31 DIAGNOSIS — F0280 Dementia in other diseases classified elsewhere without behavioral disturbance: Secondary | ICD-10-CM | POA: Diagnosis not present

## 2020-07-31 DIAGNOSIS — E039 Hypothyroidism, unspecified: Secondary | ICD-10-CM | POA: Diagnosis not present

## 2020-07-31 DIAGNOSIS — S52502D Unspecified fracture of the lower end of left radius, subsequent encounter for closed fracture with routine healing: Secondary | ICD-10-CM | POA: Diagnosis not present

## 2020-07-31 DIAGNOSIS — S32001D Stable burst fracture of unspecified lumbar vertebra, subsequent encounter for fracture with routine healing: Secondary | ICD-10-CM | POA: Diagnosis not present

## 2020-07-31 DIAGNOSIS — F0390 Unspecified dementia without behavioral disturbance: Secondary | ICD-10-CM | POA: Diagnosis not present

## 2020-07-31 DIAGNOSIS — G4700 Insomnia, unspecified: Secondary | ICD-10-CM | POA: Diagnosis not present

## 2020-07-31 DIAGNOSIS — F418 Other specified anxiety disorders: Secondary | ICD-10-CM | POA: Diagnosis not present

## 2020-08-04 ENCOUNTER — Other Ambulatory Visit: Payer: Self-pay | Admitting: Orthopedic Surgery

## 2020-08-04 DIAGNOSIS — F418 Other specified anxiety disorders: Secondary | ICD-10-CM

## 2020-08-04 MED ORDER — LORAZEPAM 0.5 MG PO TABS: 0.5000 mg | ORAL_TABLET | Freq: Two times a day (BID) | ORAL | 0 refills | Status: DC

## 2020-08-05 DIAGNOSIS — S32001D Stable burst fracture of unspecified lumbar vertebra, subsequent encounter for fracture with routine healing: Secondary | ICD-10-CM | POA: Diagnosis not present

## 2020-08-05 DIAGNOSIS — N83202 Unspecified ovarian cyst, left side: Secondary | ICD-10-CM | POA: Diagnosis not present

## 2020-08-05 DIAGNOSIS — E119 Type 2 diabetes mellitus without complications: Secondary | ICD-10-CM | POA: Diagnosis not present

## 2020-08-05 DIAGNOSIS — S52502D Unspecified fracture of the lower end of left radius, subsequent encounter for closed fracture with routine healing: Secondary | ICD-10-CM | POA: Diagnosis not present

## 2020-08-05 DIAGNOSIS — E039 Hypothyroidism, unspecified: Secondary | ICD-10-CM | POA: Diagnosis not present

## 2020-08-05 DIAGNOSIS — F0280 Dementia in other diseases classified elsewhere without behavioral disturbance: Secondary | ICD-10-CM | POA: Diagnosis not present

## 2020-08-06 DIAGNOSIS — Z23 Encounter for immunization: Secondary | ICD-10-CM | POA: Diagnosis not present

## 2020-08-07 DIAGNOSIS — S32001D Stable burst fracture of unspecified lumbar vertebra, subsequent encounter for fracture with routine healing: Secondary | ICD-10-CM | POA: Diagnosis not present

## 2020-08-07 DIAGNOSIS — E119 Type 2 diabetes mellitus without complications: Secondary | ICD-10-CM | POA: Diagnosis not present

## 2020-08-07 DIAGNOSIS — E039 Hypothyroidism, unspecified: Secondary | ICD-10-CM | POA: Diagnosis not present

## 2020-08-07 DIAGNOSIS — N83202 Unspecified ovarian cyst, left side: Secondary | ICD-10-CM | POA: Diagnosis not present

## 2020-08-07 DIAGNOSIS — F0280 Dementia in other diseases classified elsewhere without behavioral disturbance: Secondary | ICD-10-CM | POA: Diagnosis not present

## 2020-08-07 DIAGNOSIS — S52502D Unspecified fracture of the lower end of left radius, subsequent encounter for closed fracture with routine healing: Secondary | ICD-10-CM | POA: Diagnosis not present

## 2020-08-12 DIAGNOSIS — E119 Type 2 diabetes mellitus without complications: Secondary | ICD-10-CM | POA: Diagnosis not present

## 2020-08-12 DIAGNOSIS — E039 Hypothyroidism, unspecified: Secondary | ICD-10-CM | POA: Diagnosis not present

## 2020-08-12 DIAGNOSIS — S52502D Unspecified fracture of the lower end of left radius, subsequent encounter for closed fracture with routine healing: Secondary | ICD-10-CM | POA: Diagnosis not present

## 2020-08-12 DIAGNOSIS — S32001D Stable burst fracture of unspecified lumbar vertebra, subsequent encounter for fracture with routine healing: Secondary | ICD-10-CM | POA: Diagnosis not present

## 2020-08-12 DIAGNOSIS — N83202 Unspecified ovarian cyst, left side: Secondary | ICD-10-CM | POA: Diagnosis not present

## 2020-08-12 DIAGNOSIS — F0280 Dementia in other diseases classified elsewhere without behavioral disturbance: Secondary | ICD-10-CM | POA: Diagnosis not present

## 2020-08-14 DIAGNOSIS — N83202 Unspecified ovarian cyst, left side: Secondary | ICD-10-CM | POA: Diagnosis not present

## 2020-08-14 DIAGNOSIS — F0280 Dementia in other diseases classified elsewhere without behavioral disturbance: Secondary | ICD-10-CM | POA: Diagnosis not present

## 2020-08-14 DIAGNOSIS — E119 Type 2 diabetes mellitus without complications: Secondary | ICD-10-CM | POA: Diagnosis not present

## 2020-08-14 DIAGNOSIS — S52502D Unspecified fracture of the lower end of left radius, subsequent encounter for closed fracture with routine healing: Secondary | ICD-10-CM | POA: Diagnosis not present

## 2020-08-14 DIAGNOSIS — E039 Hypothyroidism, unspecified: Secondary | ICD-10-CM | POA: Diagnosis not present

## 2020-08-14 DIAGNOSIS — S32001D Stable burst fracture of unspecified lumbar vertebra, subsequent encounter for fracture with routine healing: Secondary | ICD-10-CM | POA: Diagnosis not present

## 2020-08-18 ENCOUNTER — Encounter: Payer: Self-pay | Admitting: Orthopedic Surgery

## 2020-08-18 ENCOUNTER — Non-Acute Institutional Stay (SKILLED_NURSING_FACILITY): Payer: Medicare Other | Admitting: Orthopedic Surgery

## 2020-08-18 DIAGNOSIS — E782 Mixed hyperlipidemia: Secondary | ICD-10-CM

## 2020-08-18 DIAGNOSIS — K5901 Slow transit constipation: Secondary | ICD-10-CM | POA: Diagnosis not present

## 2020-08-18 DIAGNOSIS — E119 Type 2 diabetes mellitus without complications: Secondary | ICD-10-CM | POA: Diagnosis not present

## 2020-08-18 DIAGNOSIS — E039 Hypothyroidism, unspecified: Secondary | ICD-10-CM | POA: Diagnosis not present

## 2020-08-18 DIAGNOSIS — R296 Repeated falls: Secondary | ICD-10-CM | POA: Diagnosis not present

## 2020-08-18 DIAGNOSIS — I10 Essential (primary) hypertension: Secondary | ICD-10-CM | POA: Diagnosis not present

## 2020-08-18 DIAGNOSIS — F418 Other specified anxiety disorders: Secondary | ICD-10-CM | POA: Diagnosis not present

## 2020-08-18 DIAGNOSIS — F0151 Vascular dementia with behavioral disturbance: Secondary | ICD-10-CM | POA: Diagnosis not present

## 2020-08-18 DIAGNOSIS — F01518 Vascular dementia, unspecified severity, with other behavioral disturbance: Secondary | ICD-10-CM

## 2020-08-18 NOTE — Progress Notes (Signed)
Location:   [place] Room Number: N39 Place of Service:  SNF (31) Provider:  Windell Moulding, NP    Patient Care Team: [name], MD as PCP - General (Internal Medicine)  Extended Emergency Contact Information Primary Emergency Contact: Harris,Margaret Address: [address]          [place], [place] [address] [name] of [place] Phone: [phone] Mobile Phone: [phone] Relation: Daughter  Code Status:  DNR Goals of care: Advanced Directive information Advanced Directives 08/18/2020  Does Patient Have a Medical Advance Directive? Yes  Type of Paramedic of [place];Living will;Out of facility DNR (pink MOST or yellow form)  Does patient want to make changes to medical advance directive? No - Patient declined  Copy of [place] in Chart? Yes - validated most recent copy scanned in chart (See row information)  Would patient like information on creating a medical advance directive? -  Pre-existing out of facility DNR order (yellow form or pink MOST form) Yellow form placed in chart (order not valid for inpatient use)     Chief Complaint  Patient presents with   Medical Management of Chronic Issues    Routine follow up.    Health Maintenance    Discuss need for ophthalmology exam.     HPI:  Pt is a 87 y.o. female seen today for medical management of chronic diseases.    She resides on the skilled nursing unit at [facility] due to dementia and mobility issues. Past medical history includes: hypertension, hyperlipidemia,hypothyroidism, constipation, diabetes, stress and urge incontinence, depression, anxiety, and gait abnormality.   Dementia- Depakote 125 mg bid, ativan 0.5 mg bid prn, Seroquel 25 mg qhs, wander guard left ankle, no recent behavioral incidents, appropriate for memory care unit- daughter does not want to introduce her to new environment. No recent behavioral outbursts, but had been  refusing ADLs like bathing at times.  Hypothyroidism-remains stable with levothyroxine 75 mcg daily, TSH 3.509 12/29/2019. Constipation- daily colace and prn miralax HTN- losartan 25 mg daily and lopressor 12.5 mg bid, BUN/creat 23/0.62 07/17/2020 HLD- LDL 50 04/17/2020, remains on statin Depression- Zoloft 25 mg daily Diabetes- A1c 5.5 04/17/2020, Amaryl 1 mg daily, no recent hypoglycemic events Recent fall- 06/13 she fell in front of nurses station, initially had some right arm pain. She denies pain today.   She knows it is her birthday tomorrow, she states she will be 87 years old, actually 43.   Recent blood pressures:  06/16- 127/70, 152/86  06/15- 150/86  06/14- 119/70, 127/68  Recent weight trends:  06/08- 133.5 lbs  05/02- 133.5 lbs  04/01- 138.2 lbs  03/01- 137.6 lbs  Nurse does not report any concerns, vitals stable.    Past Medical History:  Diagnosis Date   Hearing loss    History of fracture of clavicle    Hypothyroidism    Insomnia    Tinnitus    Past Surgical History:  Procedure Laterality Date   CHOLECYSTECTOMY  2007   Dr. Verdene Lennert   CLAVICLE EXCISION  1986   INTRAMEDULLARY (IM) NAIL INTERTROCHANTERIC Left 04/09/2020   Procedure: LEFT INTERTROCHANTERIC INTRAMEDULLARY (IM) NAIL;  Surgeon: Leandrew Koyanagi, MD;  Location: [place];  Service: Orthopedics;  Laterality: Left;   MOHS SURGERY     past 10 years chest & legs   OPEN REDUCTION INTERNAL FIXATION (ORIF) DISTAL RADIAL FRACTURE Left 04/09/2020   Procedure: CLOSED REDUCTION LEFT DISTAL RADIUS FRACTURE;  Surgeon: Erlinda Hong,  Marylynn [place], MD;  Location: [facility];  Service: Orthopedics;  Laterality: Left;    Allergies  Allergen Reactions   Metformin And Related Diarrhea    Allergies as of 08/18/2020       Reactions   Metformin And Related Diarrhea        Medication List        Accurate as of August 18, 2020 12:34 PM. If you have any questions, ask your nurse or doctor.          acetaminophen 325 MG  tablet Commonly known as: TYLENOL Take 650 mg by mouth 3 (three) times daily as needed.   atorvastatin 10 MG tablet Commonly known as: LIPITOR Take 10 mg by mouth daily.   divalproex 125 MG DR tablet Commonly known as: DEPAKOTE Take 125 mg by mouth 2 (two) times daily.   docusate sodium 100 MG capsule Commonly known as: COLACE Take 100 mg by mouth daily.   glimepiride 1 MG tablet Commonly known as: AMARYL Take 1 mg by mouth daily with breakfast.   levothyroxine 75 MCG tablet Commonly known as: SYNTHROID TAKE 1 TABLET (75 MCG TOTAL) BY MOUTH DAILY BEFORE BREAKFAST.   LORazepam 0.5 MG tablet Commonly known as: ATIVAN Take 1 tablet (0.5 mg total) by mouth 2 (two) times daily. PRN x 14 days for extreme agitation/anxiety.   losartan 25 MG tablet Commonly known as: COZAAR Take 25 mg by mouth daily.   methocarbamol 500 MG tablet Commonly known as: ROBAXIN Take 250 mg by mouth every 8 (eight) hours as needed for muscle spasms.   metoprolol tartrate 25 MG tablet Commonly known as: LOPRESSOR Take 12.5 mg by mouth 2 (two) times daily.   polyethylene glycol 17 g packet Commonly known as: MIRALAX / GLYCOLAX Take 17 g by mouth daily as needed for moderate constipation.   QUEtiapine 25 MG tablet Commonly known as: SEROQUEL Take 25 mg by mouth at bedtime.   sertraline 25 MG tablet Commonly known as: ZOLOFT Take 50 mg by mouth daily.   Vitamin D3 50 MCG (2000 UT) Tabs Take 1 tablet by mouth daily.   zinc oxide 20 % ointment Apply 1 application topically as needed for irritation.        Review of Systems  Unable to perform ROS: Dementia   Immunization History  Administered Date(s) Administered   DTaP 07/20/2013   Influenza, High Dose Seasonal PF 12/07/2016   Influenza,inj,Quad PF,6+ Mos 12/01/2017   Influenza-Unspecified 11/20/2014, 12/12/2015, 11/20/2019   Moderna SARS-COV2 Booster Vaccination 08/06/2020   Moderna Sars-Covid-2 Vaccination 03/05/2019, 04/02/2019    PPD Test 08/06/2014   Pneumococcal Conjugate-13 11/07/2013   Pneumococcal Polysaccharide-23 03/22/2017   Zoster Recombinat (Shingrix) 06/07/2017, 08/26/2017   Zoster, Live 03/01/2006   Pertinent  Health Maintenance Due  Topic Date Due   OPHTHALMOLOGY EXAM  Never done   INFLUENZA VACCINE  09/29/2020   HEMOGLOBIN A1C  10/05/2020   FOOT EXAM  07/23/2021   DEXA SCAN  Completed   PNA vac Low Risk Adult  Completed   Fall Risk  12/19/2019 10/17/2019 08/22/2019 07/11/2019 06/13/2019  Falls in the past year? 0 0 0 0 0  Number falls in past yr: 0 0 0 0 0  Injury with Fall? - - 0 - -   Functional Status Survey:    Vitals:   08/18/20 1229  BP: 127/70  Pulse: 94  Resp: 16  Temp: (!) 97.4 F (36.3 C)  SpO2: 97%  Weight: 133 lb 8 oz (60.6 kg)  Height: 5' (1.524 m)   Body mass index is 26.07 kg/m. Physical Exam Vitals reviewed.  Constitutional:      General: She is not in acute distress. HENT:     Head: Normocephalic.     Right Ear: There is no impacted cerumen.     Left Ear: There is no impacted cerumen.     Nose: Nose normal.     Mouth/Throat:     Mouth: Mucous membranes are moist.     Pharynx: No posterior oropharyngeal erythema.  Eyes:     General:        Right eye: No discharge.        Left eye: No discharge.  Cardiovascular:     Rate and Rhythm: Normal rate and regular rhythm.     Pulses: Normal pulses.     Heart sounds:    Friction rub present.  Pulmonary:     Effort: Pulmonary effort is normal. No respiratory distress.     Breath sounds: Normal breath sounds. No wheezing.  Abdominal:     General: Bowel sounds are normal. There is no distension.     Palpations: Abdomen is soft.     Tenderness: There is no abdominal tenderness.  Musculoskeletal:     Right shoulder: No swelling, deformity or tenderness. Normal range of motion.     Cervical back: Normal range of motion.     Right hip: No deformity or tenderness. Normal range of motion. Normal strength.     Right  lower leg: No edema.     Left lower leg: No edema.  Lymphadenopathy:     Cervical: No cervical adenopathy.  Skin:    General: Skin is warm and dry.     Capillary Refill: Capillary refill takes less than 2 seconds.  Neurological:     General: No focal deficit present.     Mental Status: She is alert. Mental status is at baseline.     Motor: Weakness present.     Gait: Gait abnormal.     Comments: wheelchair  Psychiatric:        Mood and Affect: Mood normal.        Behavior: Behavior normal.        Cognition and Memory: Memory is impaired.    Labs reviewed: Recent Labs    04/10/20 0155 04/11/20 0136 04/14/20 0354 04/17/20 0000 05/01/20 0000 07/17/20 0000  NA 138 141 142 142 141 143  K 3.9 3.5 3.4* 3.7 4.2 4.2  CL 104 108 103 107 106 107  CO2 23 23 25  26* 26* 27*  GLUCOSE 150* 106* 101*  --   --   --   BUN 12 16 17 17  23* 23*  CREATININE 0.62 0.57 0.61 0.5 0.5 0.6  CALCIUM 8.5* 8.0* 8.3* 8.0* 9.0 8.7   Recent Labs    12/13/19 0000 01/10/20 0000 04/17/20 0000 05/01/20 0000 07/17/20 0000  AST 15   < > 16 13 12*  ALT 11   < > 11 11 7   ALKPHOS  --    < > 74 127* 79  BILITOT 0.4  --   --   --   --   PROT 6.5  --   --   --   --   ALBUMIN  --    < > 2.6* 3.5 3.3*   < > = values in this interval not displayed.   Recent Labs    04/11/20 0136 04/12/20 0200 04/12/20 1841 04/14/20 0354 05/01/20 0000 06/09/20 0000  07/17/20 0000  WBC 10.2 9.0  --  11.0* 7.9 9.8 9.0  NEUTROABS  --   --   --   --  4,471.00 5,772.00 5,517.00  HGB 8.1* 7.5*   < > 9.4* 11.2* 12.1 11.3*  HCT 26.2* 23.4*   < > 30.9* 35* 38 36  MCV 84.8 83.9  --  85.8  --   --   --   PLT 276 270  --  317 322 299 258   < > = values in this interval not displayed.   Lab Results  Component Value Date   TSH 3.509 12/29/2019   Lab Results  Component Value Date   HGBA1C 5.5 04/07/2020   Lab Results  Component Value Date   CHOL 95 04/17/2020   HDL 22 (A) 04/17/2020   LDLCALC 50 04/17/2020   TRIG 150  04/17/2020   CHOLHDL 3.0 12/13/2019    Significant Diagnostic Results in last 30 days:  No results found.  Assessment/Plan 1. Vascular dementia with behavior disturbance ([place]) - refusing some ADLs - no behavioral outbursts - cont Depakote, ativan prn, and Seroquel regimen - recommend memory care, but daughter does not want to change her environment  2. Frequent falls - fall 06/14, some right arm pain initially but subsided - cont PT - cont to use wheelchair  3. Diabetes mellitus type 2 in nonobese (HCC) -A1c 5.5 04/17/2020, no recent hypoglycemic events -cont Amaryl 1 mg daily   4. Mixed hyperlipidemia - stable with statin  5. Acquired hypothyroidism -TSH 3.509 12/29/2019. - cont levothyroxine 75 mcg daily   6. Primary hypertension - controlled with losartan and lopressor -BUN/creat 23/0.62 07/17/2020  7. Depression with anxiety - stable with Zoloft  8. Slow transit constipation - stable with daily colace and prn miralax    Family/ staff Communication: plan discussed with patient and nurse  Labs/tests ordered: none

## 2020-08-22 DIAGNOSIS — M25551 Pain in right hip: Secondary | ICD-10-CM | POA: Diagnosis not present

## 2020-08-22 DIAGNOSIS — M545 Low back pain, unspecified: Secondary | ICD-10-CM | POA: Diagnosis not present

## 2020-08-29 DIAGNOSIS — L84 Corns and callosities: Secondary | ICD-10-CM | POA: Diagnosis not present

## 2020-08-29 DIAGNOSIS — Q6689 Other  specified congenital deformities of feet: Secondary | ICD-10-CM | POA: Diagnosis not present

## 2020-08-29 DIAGNOSIS — E1159 Type 2 diabetes mellitus with other circulatory complications: Secondary | ICD-10-CM | POA: Diagnosis not present

## 2020-08-29 DIAGNOSIS — L602 Onychogryphosis: Secondary | ICD-10-CM | POA: Diagnosis not present

## 2020-09-11 ENCOUNTER — Non-Acute Institutional Stay (SKILLED_NURSING_FACILITY): Payer: Medicare Other | Admitting: Internal Medicine

## 2020-09-11 ENCOUNTER — Encounter: Payer: Self-pay | Admitting: Internal Medicine

## 2020-09-11 DIAGNOSIS — E782 Mixed hyperlipidemia: Secondary | ICD-10-CM | POA: Diagnosis not present

## 2020-09-11 DIAGNOSIS — I10 Essential (primary) hypertension: Secondary | ICD-10-CM

## 2020-09-11 DIAGNOSIS — E119 Type 2 diabetes mellitus without complications: Secondary | ICD-10-CM

## 2020-09-11 DIAGNOSIS — F01518 Vascular dementia, unspecified severity, with other behavioral disturbance: Secondary | ICD-10-CM

## 2020-09-11 DIAGNOSIS — R634 Abnormal weight loss: Secondary | ICD-10-CM | POA: Diagnosis not present

## 2020-09-11 DIAGNOSIS — E039 Hypothyroidism, unspecified: Secondary | ICD-10-CM

## 2020-09-11 DIAGNOSIS — D500 Iron deficiency anemia secondary to blood loss (chronic): Secondary | ICD-10-CM | POA: Diagnosis not present

## 2020-09-11 DIAGNOSIS — R296 Repeated falls: Secondary | ICD-10-CM | POA: Diagnosis not present

## 2020-09-11 DIAGNOSIS — F0151 Vascular dementia with behavioral disturbance: Secondary | ICD-10-CM

## 2020-09-11 DIAGNOSIS — F418 Other specified anxiety disorders: Secondary | ICD-10-CM | POA: Diagnosis not present

## 2020-09-11 NOTE — Progress Notes (Signed)
Location:   [place] Room Number: 39 Place of Service:  SNF [phone]) Provider:  Veleta Miners MD  [name], MD  Patient Care Team: [name], MD as PCP - General (Internal Medicine)  Extended Emergency Contact Information Primary Emergency Contact: Harris,Margaret Address: [address]          [place], [place] [address] [name] of [place] Phone: [phone] Mobile Phone: [phone] Relation: Daughter  Code Status:  DNR Goals of care: Advanced Directive information Advanced Directives 09/11/2020  Does Patient Have a Medical Advance Directive? Yes  Type of Paramedic of [place];Living will;Out of facility DNR (pink MOST or yellow form)  Does patient want to make changes to medical advance directive? No - Patient declined  Copy of [place] in Chart? Yes - validated most recent copy scanned in chart (See row information)  Would patient like information on creating a medical advance directive? -  Pre-existing out of facility DNR order (yellow form or pink MOST form) Yellow form placed in chart (order not valid for inpatient use)     Chief Complaint  Patient presents with   Medical Management of Chronic Issues   Health Maintenance    Eye exam    HPI:  Pt is a 87 y.o. female seen today for medical management of chronic diseases.     Patient has a history of type 2 diabetes, hyperlipidemia, depression, dementia, hypothyroidism, osteopenia, stress and urge urinary incontinence, Tachycardia Patient was admitted in the hospital from 10/ 27-10/ 31 with L4 burst fracture after the fall Admitted to hospital from 2/7-2/14 for left hip fracture s/p ORIF on 2/9 and fracture of lower end of radius s/p reduction  Now in SNF for long term Care Mostly Wheelchair bound Behaviors more controlled on Depakote Seroquel and Zoloft Had no New Complains Has lost 8 lbs since my last visit No New Nursing  issues No Recent falls   Past Medical History:  Diagnosis Date   Hearing loss    History of fracture of clavicle    Hypothyroidism    Insomnia    Tinnitus    Past Surgical History:  Procedure Laterality Date   CHOLECYSTECTOMY  2007   Dr. Verdene Lennert   CLAVICLE EXCISION  1986   INTRAMEDULLARY (IM) NAIL INTERTROCHANTERIC Left 04/09/2020   Procedure: LEFT INTERTROCHANTERIC INTRAMEDULLARY (IM) NAIL;  Surgeon: Leandrew Koyanagi, MD;  Location: [place];  Service: Orthopedics;  Laterality: Left;   MOHS SURGERY     past 10 years chest & legs   OPEN REDUCTION INTERNAL FIXATION (ORIF) DISTAL RADIAL FRACTURE Left 04/09/2020   Procedure: CLOSED REDUCTION LEFT DISTAL RADIUS FRACTURE;  Surgeon: Leandrew Koyanagi, MD;  Location: [place];  Service: Orthopedics;  Laterality: Left;    Allergies  Allergen Reactions   Metformin And Related Diarrhea    Allergies as of 09/11/2020       Reactions   Metformin And Related Diarrhea        Medication List        Accurate as of September 11, 2020  4:31 PM. If you have any questions, ask your nurse or doctor.          acetaminophen 325 MG tablet Commonly known as: TYLENOL Take 650 mg by mouth 3 (three) times daily as needed.   atorvastatin 10 MG tablet Commonly known as: LIPITOR Take 10 mg by mouth daily.   divalproex 125 MG DR tablet Commonly known as: DEPAKOTE  Take 125 mg by mouth 2 (two) times daily.   docusate sodium 100 MG capsule Commonly known as: COLACE Take 100 mg by mouth daily.   glimepiride 1 MG tablet Commonly known as: AMARYL Take 1 mg by mouth daily with breakfast.   levothyroxine 75 MCG tablet Commonly known as: SYNTHROID TAKE 1 TABLET (75 MCG TOTAL) BY MOUTH DAILY BEFORE BREAKFAST.   LORazepam 0.5 MG tablet Commonly known as: ATIVAN Take 1 tablet (0.5 mg total) by mouth 2 (two) times daily. PRN x 14 days for extreme agitation/anxiety.   losartan 25 MG tablet Commonly known as: COZAAR Take 25 mg by mouth daily.   methocarbamol  500 MG tablet Commonly known as: ROBAXIN Take 250 mg by mouth every 8 (eight) hours as needed for muscle spasms.   metoprolol tartrate 25 MG tablet Commonly known as: LOPRESSOR Take 12.5 mg by mouth 2 (two) times daily.   polyethylene glycol 17 g packet Commonly known as: MIRALAX / GLYCOLAX Take 17 g by mouth daily as needed for moderate constipation.   QUEtiapine 25 MG tablet Commonly known as: SEROQUEL Take 25 mg by mouth at bedtime.   sertraline 25 MG tablet Commonly known as: ZOLOFT Take 50 mg by mouth daily.   Vitamin D3 50 MCG (2000 UT) Tabs Take 1 tablet by mouth daily.   zinc oxide 20 % ointment Apply 1 application topically as needed for irritation.        Review of Systems  Unable to perform ROS: Dementia   Immunization History  Administered Date(s) Administered   DTaP 07/20/2013   Influenza, High Dose Seasonal PF 12/07/2016   Influenza,inj,Quad PF,6+ Mos 12/01/2017   Influenza-Unspecified 11/20/2014, 12/12/2015, 11/20/2019   Moderna SARS-COV2 Booster Vaccination 08/06/2020   Moderna Sars-Covid-2 Vaccination 03/05/2019, 04/02/2019   PPD Test 08/06/2014   Pneumococcal Conjugate-13 11/07/2013   Pneumococcal Polysaccharide-23 03/22/2017   Zoster Recombinat (Shingrix) 06/07/2017, 08/26/2017   Zoster, Live 03/01/2006   Pertinent  Health Maintenance Due  Topic Date Due   OPHTHALMOLOGY EXAM  Never done   INFLUENZA VACCINE  09/29/2020   HEMOGLOBIN A1C  10/05/2020   FOOT EXAM  07/23/2021   DEXA SCAN  Completed   PNA vac Low Risk Adult  Completed   Fall Risk  12/19/2019 10/17/2019 08/22/2019 07/11/2019 06/13/2019  Falls in the past year? 0 0 0 0 0  Number falls in past yr: 0 0 0 0 0  Injury with Fall? - - 0 - -   Functional Status Survey:    Vitals:   09/11/20 1626  BP: 117/69  Pulse: 85  Resp: 18  Temp: (!) 97.4 F (36.3 C)  SpO2: 97%  Weight: 131 lb 1.6 oz (59.5 kg)  Height: 5' (1.524 m)   Body mass index is 25.6 kg/m. Physical  Exam Constitutional:  Well-developed and well-nourished.  HENT:  Head: Normocephalic.  Mouth/Throat: Oropharynx is clear and moist.  Eyes: Pupils are equal, round, and reactive to light.  Neck: Neck supple.  Cardiovascular: Normal rate and normal heart sounds.  No murmur heard. Pulmonary/Chest: Effort normal and breath sounds normal. No respiratory distress. No wheezes. She has no rales.  Abdominal: Soft. Bowel sounds are normal. No distension. There is no tenderness. There is no rebound.  Musculoskeletal: No edema.  Lymphadenopathy: none Neurological:No Deficits  Skin: Skin is warm and dry.  Psychiatric: Normal mood and affect. Behavior is normal. Thought content normal.   Labs reviewed: Recent Labs    04/10/20 0155 04/11/20 0136 04/14/20 0354 04/17/20 0000  05/01/20 0000 07/17/20 0000  NA 138 141 142 142 141 143  K 3.9 3.5 3.4* 3.7 4.2 4.2  CL 104 108 103 107 106 107  CO2 23 23 25  26* 26* 27*  GLUCOSE 150* 106* 101*  --   --   --   BUN 12 16 17 17  23* 23*  CREATININE 0.62 0.57 0.61 0.5 0.5 0.6  CALCIUM 8.5* 8.0* 8.3* 8.0* 9.0 8.7   Recent Labs    12/13/19 0000 01/10/20 0000 04/17/20 0000 05/01/20 0000 07/17/20 0000  AST 15   < > 16 13 12*  ALT 11   < > 11 11 7   ALKPHOS  --    < > 74 127* 79  BILITOT 0.4  --   --   --   --   PROT 6.5  --   --   --   --   ALBUMIN  --    < > 2.6* 3.5 3.3*   < > = values in this interval not displayed.   Recent Labs    04/11/20 0136 04/12/20 0200 04/12/20 1841 04/14/20 0354 05/01/20 0000 06/09/20 0000 07/17/20 0000  WBC 10.2 9.0  --  11.0* 7.9 9.8 9.0  NEUTROABS  --   --   --   --  4,471.00 5,772.00 5,517.00  HGB 8.1* 7.5*   < > 9.4* 11.2* 12.1 11.3*  HCT 26.2* 23.4*   < > 30.9* 35* 38 36  MCV 84.8 83.9  --  85.8  --   --   --   PLT 276 270  --  317 322 299 258   < > = values in this interval not displayed.   Lab Results  Component Value Date   TSH 3.509 12/29/2019   Lab Results  Component Value Date   HGBA1C 5.5  04/07/2020   Lab Results  Component Value Date   CHOL 95 04/17/2020   HDL 22 (A) 04/17/2020   LDLCALC 50 04/17/2020   TRIG 150 04/17/2020   CHOLHDL 3.0 12/13/2019    Significant Diagnostic Results in last 30 days:  No results found.  Assessment/Plan Vascular dementia with behavior disturbance ([place]) Doing well with Depakote and Seroquel NO GDR right now Also on Ativan PRN Diabetes mellitus type 2 in nonobese (HCC) A1C below 6 will reduce Amaryl to 0.5 mg Mixed hyperlipidemia On Lipitor LDL less then 100 Acquired hypothyroidism TSH normal Primary hypertension BP on lower side Change Cozaar to 12.5 mg  Depression with anxiety Continue Zoloft Hypothyroidism, unspecified type TSH normal in 10/21 Weight loss Will continue to monitor for now Anemia, blood loss Hgb Stable Frequent falls Supportive care   Family/ staff Communication:   Labs/tests ordered:

## 2020-09-12 ENCOUNTER — Encounter: Payer: Self-pay | Admitting: Orthopedic Surgery

## 2020-09-12 ENCOUNTER — Non-Acute Institutional Stay (INDEPENDENT_AMBULATORY_CARE_PROVIDER_SITE_OTHER): Payer: Medicare Other | Admitting: Orthopedic Surgery

## 2020-09-12 DIAGNOSIS — Z0000 Encounter for general adult medical examination without abnormal findings: Secondary | ICD-10-CM | POA: Diagnosis not present

## 2020-09-12 NOTE — Progress Notes (Addendum)
Provider:  Elodia Florence Location:  [facility]   Place of Service:   [place]   PCP: [name], MD Patient Care Team: [name], MD as PCP - General (Internal Medicine)  Extended Emergency Contact Information Primary Emergency Contact: Harris,Margaret Address: [address]          [place],  [address] [name] of [place] Phone: [phone] Mobile Phone: [phone] Relation: Daughter  Code Status: DNR Goals of Care: Advanced Directive information Advanced Directives 09/12/2020  Does Patient Have a Medical Advance Directive? Yes  Type of Paramedic of [place];Living will  Does patient want to make changes to medical advance directive? No - Patient declined  Copy of [place] in Chart? Yes - validated most recent copy scanned in chart (See row information)  Would patient like information on creating a medical advance directive? -  Pre-existing out of facility DNR order (yellow form or pink MOST form) -     Chief Complaint  Patient presents with   Annual Exam    Annual wellness exam    HPI: [name] a 87 y.o. female seen today for an annual comprehensive examination.  Past Medical History:  Diagnosis Date   Hearing loss    History of fracture of clavicle    Hypothyroidism    Insomnia    Tinnitus    Past Surgical History:  Procedure Laterality Date   CHOLECYSTECTOMY  2007   Dr. Verdene Lennert   CLAVICLE EXCISION  1986   INTRAMEDULLARY (IM) NAIL INTERTROCHANTERIC Left 04/09/2020   Procedure: LEFT INTERTROCHANTERIC INTRAMEDULLARY (IM) NAIL;  Surgeon: Leandrew Koyanagi, MD;  Location: [place];  Service: Orthopedics;  Laterality: Left;   MOHS SURGERY     past 10 years chest & legs   OPEN REDUCTION INTERNAL FIXATION (ORIF) DISTAL RADIAL FRACTURE Left 04/09/2020   Procedure: CLOSED REDUCTION LEFT DISTAL RADIUS FRACTURE;  Surgeon: Leandrew Koyanagi, MD;  Location: [place] [facility];  Service: Orthopedics;  Laterality:  Left;    reports that she has never smoked. She has never used smokeless tobacco. She reports current alcohol use. She reports that she does not use drugs. Social History   Socioeconomic History   Marital status: Widowed    Spouse name: Not on file   Number of children: Not on file   Years of education: Not on file   Highest education level: Not on file  Occupational History   Occupation: retired Web designer  Tobacco Use   Smoking status: Never   Smokeless tobacco: Never  Vaping Use   Vaping Use: Never used  Substance and Sexual Activity   Alcohol use: Yes    Comment: 4 per week   Drug use: No   Sexual activity: Never  Other Topics Concern   Not on file  Social History Narrative   Lives at [facility] since 12/23/14   Widowed   Never smoked   Alcohol 5 per week   Exercise 3 times a week classes, walking.    Activities Bridge, book groups   POA, Living Will   Still driving      Social Determinants of Health   Financial Resource Strain: Low Risk    Difficulty of Paying Living Expenses: Not hard at all  Food Insecurity: No Food Insecurity   Worried About Charity fundraiser in the Last Year: Never true   [place] in the Last Year: Never true  Transportation Needs: No  Transportation Needs   Lack of Transportation (Medical): No   Lack of Transportation (Non-Medical): No  Physical Activity: Insufficiently Active   Days of Exercise per Week: 7 days   Minutes of Exercise per Session: 20 min  Stress: No Stress Concern Present   Feeling of Stress : Only a little  Social Connections: Moderately Isolated   Frequency of Communication with Friends and Family: More than three times a week   Frequency of Social Gatherings with Friends and Family: More than three times a week   Attends Religious Services: More than 4 times per year   Active Member of [employer] or Organizations: No   Attends Archivist Meetings: Never   Marital Status: Widowed  Arboriculturist Violence: Not At Risk   Fear of Current or Ex-Partner: No   Emotionally Abused: No   Physically Abused: No   Sexually Abused: No   Family History  Problem Relation Age of Onset   Heart disease Mother    Heart disease Father     Pertinent  Health Maintenance Due  Topic Date Due   OPHTHALMOLOGY EXAM  Never done   INFLUENZA VACCINE  09/29/2020   HEMOGLOBIN A1C  10/05/2020   FOOT EXAM  07/23/2021   DEXA SCAN  Completed   PNA vac Low Risk Adult  Completed   Fall Risk  09/12/2020 12/19/2019 10/17/2019 08/22/2019 07/11/2019  Falls in the past year? 1 0 0 0 0  Number falls in past yr: 1 0 0 0 0  Injury with Fall? 1 - - 0 -  Risk for fall due to : History of fall(s);Impaired balance/gait;Impaired mobility;Mental status change - - - -  Follow up Falls evaluation completed;Education provided;Falls prevention discussed - - - -   Depression screen [facility] 2/9 09/12/2020 06/03/2017 01/19/2017 11/23/2016 09/09/2015  Decreased Interest 0 0 0 0 0  Down, Depressed, Hopeless 0 0 0 0 0  PHQ - 2 Score 0 0 0 0 0    Functional Status Survey: Is the patient deaf or have difficulty hearing?: No Does the patient have difficulty seeing, even when wearing glasses/contacts?: No Does the patient have difficulty concentrating, remembering, or making decisions?: Yes Does the patient have difficulty walking or climbing stairs?: Yes Does the patient have difficulty dressing or bathing?: Yes Does the patient have difficulty doing errands alone such as visiting a doctor's office or shopping?: Yes  Allergies  Allergen Reactions   Metformin And Related Diarrhea    Allergies as of 09/12/2020       Reactions   Metformin And Related Diarrhea        Medication List        Accurate as of September 12, 2020  2:29 PM. If you have any questions, ask your nurse or doctor.          acetaminophen 325 MG tablet Commonly known as: TYLENOL Take 650 mg by mouth 3 (three) times daily as needed.   atorvastatin 10  MG tablet Commonly known as: LIPITOR Take 10 mg by mouth daily.   divalproex 125 MG DR tablet Commonly known as: DEPAKOTE Take 125 mg by mouth 2 (two) times daily.   docusate sodium 100 MG capsule Commonly known as: COLACE Take 100 mg by mouth daily.   glimepiride 1 MG tablet Commonly known as: AMARYL Take 1 mg by mouth daily with breakfast.   levothyroxine 75 MCG tablet Commonly known as: SYNTHROID TAKE 1 TABLET (75 MCG TOTAL) BY MOUTH DAILY BEFORE BREAKFAST.  LORazepam 0.5 MG tablet Commonly known as: ATIVAN Take 1 tablet (0.5 mg total) by mouth 2 (two) times daily. PRN x 14 days for extreme agitation/anxiety.   losartan 25 MG tablet Commonly known as: COZAAR Take 25 mg by mouth daily.   methocarbamol 500 MG tablet Commonly known as: ROBAXIN Take 250 mg by mouth every 8 (eight) hours as needed for muscle spasms.   metoprolol tartrate 25 MG tablet Commonly known as: LOPRESSOR Take 12.5 mg by mouth 2 (two) times daily.   polyethylene glycol 17 g packet Commonly known as: MIRALAX / GLYCOLAX Take 17 g by mouth daily as needed for moderate constipation.   QUEtiapine 25 MG tablet Commonly known as: SEROQUEL Take 25 mg by mouth at bedtime.   sertraline 25 MG tablet Commonly known as: ZOLOFT Take 50 mg by mouth daily.   Vitamin D3 50 MCG (2000 UT) Tabs Take 1 tablet by mouth daily.   zinc oxide 20 % ointment Apply 1 application topically as needed for irritation.        Review of Systems  Vitals:   09/12/20 1055  BP: 117/69  Pulse: 85  Resp: 18  Temp: 97.9 F (36.6 C)  SpO2: 94%  Weight: 131 lb 1.6 oz (59.5 kg)  Height: 5' (1.524 m)   Body mass index is 25.6 kg/m. Physical Exam  Labs reviewed: Basic Metabolic Panel: Recent Labs    04/10/20 0155 04/11/20 0136 04/14/20 0354 04/17/20 0000 05/01/20 0000 07/17/20 0000  NA 138 141 142 142 141 143  K 3.9 3.5 3.4* 3.7 4.2 4.2  CL 104 108 103 107 106 107  CO2 23 23 25  26* 26* 27*  GLUCOSE  150* 106* 101*  --   --   --   BUN 12 16 17 17  23* 23*  CREATININE 0.62 0.57 0.61 0.5 0.5 0.6  CALCIUM 8.5* 8.0* 8.3* 8.0* 9.0 8.7   Liver Function Tests: Recent Labs    12/13/19 0000 01/10/20 0000 04/17/20 0000 05/01/20 0000 07/17/20 0000  AST 15   < > 16 13 12*  ALT 11   < > 11 11 7   ALKPHOS  --    < > 74 127* 79  BILITOT 0.4  --   --   --   --   PROT 6.5  --   --   --   --   ALBUMIN  --    < > 2.6* 3.5 3.3*   < > = values in this interval not displayed.   No results for input(s): LIPASE, AMYLASE in the last 8760 hours. No results for input(s): AMMONIA in the last 8760 hours. CBC: Recent Labs    04/11/20 0136 04/12/20 0200 04/12/20 1841 04/14/20 0354 05/01/20 0000 06/09/20 0000 07/17/20 0000  WBC 10.2 9.0  --  11.0* 7.9 9.8 9.0  NEUTROABS  --   --   --   --  4,471.00 5,772.00 5,517.00  HGB 8.1* 7.5*   < > 9.4* 11.2* 12.1 11.3*  HCT 26.2* 23.4*   < > 30.9* 35* 38 36  MCV 84.8 83.9  --  85.8  --   --   --   PLT 276 270  --  317 322 299 258   < > = values in this interval not displayed.   Cardiac Enzymes: No results for input(s): CKTOTAL, CKMB, CKMBINDEX, TROPONINI in the last 8760 hours. BNP: Invalid input(s): POCBNP Lab Results  Component Value Date   HGBA1C 5.5 04/07/2020   Lab Results  Component Value Date   TSH 3.509 12/29/2019   No results found for: VITAMINB12 No results found for: FOLATE No results found for: IRON, TIBC, FERRITIN  Imaging and Procedures obtained recently: No results found.  Assessment/Plan There are no diagnoses linked to this encounter.   Family/ staff Communication:   Labs/tests ordered:    Subjective:   Jennell Janosik is a 87 y.o. female who presents for Medicare Annual (Subsequent) preventive examination.  Review of Systems     Cardiac Risk Factors include: advanced age (>23men, >40 women);diabetes mellitus;sedentary lifestyle;hypertension     Objective:    Today's Vitals   09/12/20 1055  BP: 117/69   Pulse: 85  Resp: 18  Temp: 97.9 F (36.6 C)  SpO2: 94%  Weight: 131 lb 1.6 oz (59.5 kg)  Height: 5' (1.524 m)   Body mass index is 25.6 kg/m.  Advanced Directives 09/12/2020 09/11/2020 08/18/2020 07/23/2020 06/06/2020 04/07/2020 02/28/2020  Does Patient Have a Medical Advance Directive? Yes Yes Yes Yes Yes No;Yes Yes  Type of Paramedic of [place];Living will [place];Living will;Out of facility DNR (pink MOST or yellow form) [place];Living will;Out of facility DNR (pink MOST or yellow form) [place];Living will;Out of facility DNR (pink MOST or yellow form) [place];Living will;Out of facility DNR (pink MOST or yellow form) Out of facility DNR (pink MOST or yellow form);[place];Living will [place]  Does patient want to make changes to medical advance directive? No - Patient declined No - Patient declined No - Patient declined No - Patient declined No - Patient declined No - Patient declined No - Patient declined  Copy of [place] in Chart? Yes - validated most recent copy scanned in chart (See row information) Yes - validated most recent copy scanned in chart (See row information) Yes - validated most recent copy scanned in chart (See row information) Yes - validated most recent copy scanned in chart (See row information) Yes - validated most recent copy scanned in chart (See row information) No - copy available, Physician notified Yes - validated most recent copy scanned in chart (See row information)  Would patient like information on creating a medical advance directive? - - - - - No - Patient declined -  Pre-existing out of facility DNR order (yellow form or pink MOST form) - Yellow form placed in chart (order not valid for inpatient use) Yellow form placed in chart (order not valid for inpatient use) Yellow form placed in chart (order  not valid for inpatient use) Yellow form placed in chart (order not valid for inpatient use) Pink Most/Yellow Form available - Physician notified to receive inpatient order Yellow form placed in chart (order not valid for inpatient use)    Current Medications (verified) Outpatient Encounter Medications as of 09/12/2020  Medication Sig   acetaminophen (TYLENOL) 325 MG tablet Take 650 mg by mouth 3 (three) times daily as needed.   atorvastatin (LIPITOR) 10 MG tablet Take 10 mg by mouth daily.   Cholecalciferol (VITAMIN D3) 2000 units TABS Take 1 tablet by mouth daily.    divalproex (DEPAKOTE) 125 MG DR tablet Take 125 mg by mouth 2 (two) times daily.   docusate sodium (COLACE) 100 MG capsule Take 100 mg by mouth daily.   glimepiride (AMARYL) 1 MG tablet Take 1 mg by mouth daily with breakfast.   levothyroxine (SYNTHROID) 75 MCG tablet TAKE 1 TABLET (75  MCG TOTAL) BY MOUTH DAILY BEFORE BREAKFAST.   LORazepam (ATIVAN) 0.5 MG tablet Take 1 tablet (0.5 mg total) by mouth 2 (two) times daily. PRN x 14 days for extreme agitation/anxiety.   losartan (COZAAR) 25 MG tablet Take 25 mg by mouth daily.   methocarbamol (ROBAXIN) 500 MG tablet Take 250 mg by mouth every 8 (eight) hours as needed for muscle spasms.   metoprolol tartrate (LOPRESSOR) 25 MG tablet Take 12.5 mg by mouth 2 (two) times daily.   polyethylene glycol (MIRALAX / GLYCOLAX) 17 g packet Take 17 g by mouth daily as needed for moderate constipation.   QUEtiapine (SEROQUEL) 25 MG tablet Take 25 mg by mouth at bedtime.   sertraline (ZOLOFT) 25 MG tablet Take 50 mg by mouth daily.    zinc oxide 20 % ointment Apply 1 application topically as needed for irritation.   No facility-administered encounter medications on file as of 09/12/2020.    Allergies (verified) Metformin and related   History: Past Medical History:  Diagnosis Date   Hearing loss    History of fracture of clavicle    Hypothyroidism    Insomnia    Tinnitus    Past  Surgical History:  Procedure Laterality Date   CHOLECYSTECTOMY  2007   Dr. Verdene Lennert   CLAVICLE EXCISION  1986   INTRAMEDULLARY (IM) NAIL INTERTROCHANTERIC Left 04/09/2020   Procedure: LEFT INTERTROCHANTERIC INTRAMEDULLARY (IM) NAIL;  Surgeon: Leandrew Koyanagi, MD;  Location: [place];  Service: Orthopedics;  Laterality: Left;   MOHS SURGERY     past 10 years chest & legs   OPEN REDUCTION INTERNAL FIXATION (ORIF) DISTAL RADIAL FRACTURE Left 04/09/2020   Procedure: CLOSED REDUCTION LEFT DISTAL RADIUS FRACTURE;  Surgeon: Leandrew Koyanagi, MD;  Location: [place];  Service: Orthopedics;  Laterality: Left;   Family History  Problem Relation Age of Onset   Heart disease Mother    Heart disease Father    Social History   Socioeconomic History   Marital status: Widowed    Spouse name: Not on file   Number of children: Not on file   Years of education: Not on file   Highest education level: Not on file  Occupational History   Occupation: retired Web designer  Tobacco Use   Smoking status: Never   Smokeless tobacco: Never  Vaping Use   Vaping Use: Never used  Substance and Sexual Activity   Alcohol use: Yes    Comment: 4 per week   Drug use: No   Sexual activity: Never  Other Topics Concern   Not on file  Social History Narrative   Lives at [facility] since 12/23/14   Widowed   Never smoked   Alcohol 5 per week   Exercise 3 times a week classes, walking.    Activities Bridge, book groups   POA, Living Will   Still driving      Social Determinants of Health   Financial Resource Strain: Low Risk    Difficulty of Paying Living Expenses: Not hard at all  Food Insecurity: No Food Insecurity   Worried About Charity fundraiser in the Last Year: Never true   Arboriculturist in the Last Year: Never true  Transportation Needs: No Transportation Needs   Lack of Transportation (Medical): No   Lack of Transportation (Non-Medical): No  Physical Activity: Insufficiently Active   Days of  Exercise per Week: 7 days   Minutes of Exercise per Session: 20 min  Stress:  No Stress Concern Present   Feeling of Stress : Only a little  Social Connections: Moderately Isolated   Frequency of Communication with Friends and Family: More than three times a week   Frequency of Social Gatherings with Friends and Family: More than three times a week   Attends Religious Services: More than 4 times per year   Active Member of [employer] or Organizations: No   Attends Archivist Meetings: Never   Marital Status: Widowed    Tobacco Counseling Counseling given: Not Answered   Clinical Intake:  Pre-visit preparation completed: Yes  Pain : No/denies pain BMI - recorded: 25.6 Nutritional Status: BMI 25 -29 Overweight Nutritional Risks: None Diabetes: Yes CBG done?: No Did pt. bring in CBG monitor from home?: No  How often do you need to have someone help you when you read instructions, pamphlets, or other written materials from your doctor or pharmacy?: 5 - Always What is the last grade level you completed in school?: Gearhart degree from [employer]  Diabetic? Yes  Interpreter Needed?: No      Activities of Daily Living In your present state of health, do you have any difficulty performing the following activities: 09/12/2020 12/27/2019  Hearing? N Y  Vision? N N  Difficulty concentrating or making decisions? Tempie Donning  Walking or climbing stairs? Y Y  Dressing or bathing? Y Y  Doing errands, shopping? Y N  Preparing Food and eating ? Y -  Using the Toilet? Y -  In the past six months, have you accidently leaked urine? Y -  Do you have problems with loss of bowel control? N -  Managing your Medications? Y -  Managing your Finances? Y -  Housekeeping or managing your Housekeeping? Y -  Some recent data might be hidden    Patient Care Team: [name], MD as PCP - General (Internal Medicine)  Indicate any recent Medical Services you may have received from  other than [facility] providers in the past year (date may be approximate).     Assessment:   This is a routine wellness examination for Elanna.  Hearing/Vision screen No results found.  Dietary issues and exercise activities discussed: Current Exercise Habits: Home exercise routine, Type of exercise: Other - see comments (Ambulates with wheelchair, seen out of room daily), Time (Minutes): 20, Frequency (Times/Week): 7, Weekly Exercise (Minutes/Week): 140, Intensity: Mild, Exercise limited by: orthopedic condition(s);neurologic condition(s)   Goals Addressed             This Visit's Progress    DIET - INCREASE WATER INTAKE   Not on track      Depression Screen PHQ 2/9 Scores 09/12/2020 06/03/2017 01/19/2017 11/23/2016 09/09/2015  PHQ - 2 Score 0 0 0 0 0    Fall Risk Fall Risk  09/12/2020 12/19/2019 10/17/2019 08/22/2019 07/11/2019  Falls in the past year? 1 0 0 0 0  Number falls in past yr: 1 0 0 0 0  Injury with Fall? 1 - - 0 -  Risk for fall due to : History of fall(s);Impaired balance/gait;Impaired mobility;Mental status change - - - -  Follow up Falls evaluation completed;Education provided;Falls prevention discussed - - - -    FALL RISK PREVENTION PERTAINING TO THE HOME:  Any stairs in or around the home? No  If so, are there any without handrails? No  Home free of loose throw rugs in walkways, pet beds, electrical cords, etc? Yes  Adequate lighting in your home to reduce  risk of falls? Yes   ASSISTIVE DEVICES UTILIZED TO PREVENT FALLS:  Life alert? No  Use of a cane, walker or w/c? Yes  Grab bars in the bathroom? Yes  Shower chair or bench in shower? Yes  Elevated toilet seat or a handicapped toilet? Yes   TIMED UP AND GO:  Was the test performed? No .  Length of time to ambulate 10 feet:  sec.   Gait unsteady without use of assistive device, provider informed and interventions were implemented  Cognitive Function: MMSE - Mini Mental State Exam 09/12/2020 06/13/2019  10/04/2018 01/19/2017  Not completed: Unable to complete - - (No Data)  Orientation to time - 3 2 5   Orientation to Place - 5 5 5   Registration - 3 3 3   Attention/ Calculation - 0 5 5  Recall - 2 3 3   Language- name 2 objects - 2 2 2   Language- repeat - 1 1 1   Language- follow 3 step command - 3 3 3   Language- read & follow direction - 1 0 1  Write a sentence - 0 1 1  Copy design - 1 0 1  Total score - 21 25 30      6CIT Screen 09/12/2020  What Year? 0 points  What month? 0 points  What time? 3 points  Count back from 20 2 points  Months in reverse 2 points  Repeat phrase 4 points  Total Score 11    Immunizations Immunization History  Administered Date(s) Administered   DTaP 07/20/2013   Influenza, High Dose Seasonal PF 12/07/2016   Influenza,inj,Quad PF,6+ Mos 12/01/2017   Influenza-Unspecified 11/20/2014, 12/12/2015, 11/20/2019   Moderna SARS-COV2 Booster Vaccination 08/06/2020   Moderna Sars-Covid-2 Vaccination 03/05/2019, 04/02/2019   PPD Test 08/06/2014   Pneumococcal Conjugate-13 11/07/2013   Pneumococcal Polysaccharide-23 03/22/2017   Zoster Recombinat (Shingrix) 06/07/2017, 08/26/2017   Zoster, Live 03/01/2006    TDAP status: Up to date  Flu Vaccine status: Up to date  Pneumococcal vaccine status: Up to date  Covid-19 vaccine status: Completed vaccines  Qualifies for Shingles Vaccine? Yes   Zostavax completed Yes   Shingrix Completed?: No.    Education has been provided regarding the importance of this vaccine. Patient has been advised to call insurance company to determine out of pocket expense if they have not yet received this vaccine. Advised may also receive vaccine at local pharmacy or Health Dept. Verbalized acceptance and understanding.  Screening Tests Health Maintenance  Topic Date Due   OPHTHALMOLOGY EXAM  Never done   TETANUS/TDAP  11/24/2026 (Originally 08/19/1947)   INFLUENZA VACCINE  09/29/2020   HEMOGLOBIN A1C  10/05/2020   COVID-19  Vaccine (4 - Booster for Moderna series) 12/06/2020   FOOT EXAM  07/23/2021   DEXA SCAN  Completed   PNA vac Low Risk Adult  Completed   Zoster Vaccines- Shingrix  Completed   HPV VACCINES  Aged Out    Health Maintenance  Health Maintenance Due  Topic Date Due   OPHTHALMOLOGY EXAM  Never done    Colorectal cancer screening: No longer required.   Mammogram status: No longer required due to advanced age.  Bone Density status: Completed 2020. Results reflect: Bone density results: OSTEOPENIA. Repeat every 2 years.  Lung Cancer Screening: (Low Dose CT Chest recommended if Age 13-80 years, 30 pack-year currently smoking OR have quit w/in 15years.) does not qualify.   Lung Cancer Screening Referral: No  Additional Screening:  Hepatitis C Screening: does not qualify; advanced age  Vision Screening: Recommended annual ophthalmology exams for early detection of glaucoma and other disorders of the eye. Is the patient up to date with their annual eye exam?  No  Who is the provider or what is the name of the office in which the patient attends annual eye exams? N/A If pt is not established with a provider, would they like to be referred to a provider to establish care? No .   Dental Screening: Recommended annual dental exams for proper oral hygiene  Community Resource Referral / Chronic Care Management: CRR required this visit?  No   CCM required this visit?  No      Plan:     I have personally reviewed and noted the following in the patient's chart:   Medical and social history Use of alcohol, tobacco or illicit drugs  Current medications and supplements including opioid prescriptions.  Functional ability and status Nutritional status Physical activity Advanced directives List of other physicians Hospitalizations, surgeries, and ER visits in previous 12 months Vitals Screenings to include cognitive, depression, and falls Referrals and appointments  In addition, I  have reviewed and discussed with patient certain preventive protocols, quality metrics, and best practice recommendations. A written personalized care plan for preventive services as well as general preventive health recommendations were provided to patient.     Yvonna Alanis, NP   09/12/2020

## 2020-09-12 NOTE — Patient Instructions (Signed)
  Lisa Castillo , Thank you for taking time to come for your Medicare Wellness Visit. I appreciate your ongoing commitment to your health goals. Please review the following plan we discussed and let me know if I can assist you in the future.   These are the goals we discussed:  Goals      DIET - INCREASE WATER INTAKE        This is a list of the screening recommended for you and due dates:  Health Maintenance  Topic Date Due   Eye exam for diabetics  Never done   Tetanus Vaccine  11/24/2026*   Flu Shot  09/29/2020   Hemoglobin A1C  10/05/2020   COVID-19 Vaccine (4 - Booster for Moderna series) 12/06/2020   Complete foot exam   07/23/2021   DEXA scan (bone density measurement)  Completed   Pneumonia vaccines  Completed   Zoster (Shingles) Vaccine  Completed   HPV Vaccine  Aged Out  *Topic was postponed. The date shown is not the original due date.

## 2020-10-20 ENCOUNTER — Non-Acute Institutional Stay (SKILLED_NURSING_FACILITY): Payer: Medicare Other | Admitting: Nurse Practitioner

## 2020-10-20 ENCOUNTER — Encounter: Payer: Self-pay | Admitting: Nurse Practitioner

## 2020-10-20 DIAGNOSIS — K5901 Slow transit constipation: Secondary | ICD-10-CM

## 2020-10-20 DIAGNOSIS — E119 Type 2 diabetes mellitus without complications: Secondary | ICD-10-CM | POA: Diagnosis not present

## 2020-10-20 DIAGNOSIS — E039 Hypothyroidism, unspecified: Secondary | ICD-10-CM | POA: Diagnosis not present

## 2020-10-20 DIAGNOSIS — M159 Polyosteoarthritis, unspecified: Secondary | ICD-10-CM | POA: Insufficient documentation

## 2020-10-20 DIAGNOSIS — F418 Other specified anxiety disorders: Secondary | ICD-10-CM | POA: Diagnosis not present

## 2020-10-20 DIAGNOSIS — E782 Mixed hyperlipidemia: Secondary | ICD-10-CM | POA: Diagnosis not present

## 2020-10-20 DIAGNOSIS — D500 Iron deficiency anemia secondary to blood loss (chronic): Secondary | ICD-10-CM | POA: Diagnosis not present

## 2020-10-20 DIAGNOSIS — M8949 Other hypertrophic osteoarthropathy, multiple sites: Secondary | ICD-10-CM | POA: Diagnosis not present

## 2020-10-20 DIAGNOSIS — I10 Essential (primary) hypertension: Secondary | ICD-10-CM | POA: Diagnosis not present

## 2020-10-20 DIAGNOSIS — R296 Repeated falls: Secondary | ICD-10-CM

## 2020-10-20 NOTE — Assessment & Plan Note (Signed)
takes Levothyroxine, TSH 3.509 12/29/19

## 2020-10-20 NOTE — Assessment & Plan Note (Signed)
Blood pressure is controlled, takes Metoprolol, Losartan, Bun/creat 23/0.6 07/17/20

## 2020-10-20 NOTE — Assessment & Plan Note (Signed)
Hgb 11.3 07/17/20

## 2020-10-20 NOTE — Assessment & Plan Note (Signed)
OA, s/p Closed left hip fracture, s/p ORIF 04/09/20. L arm/the lower end of left radius, s/p closed reduction. Takes Tylenol for pain.

## 2020-10-20 NOTE — Assessment & Plan Note (Signed)
takes Atorvastatin. LDL 51 12/13/19

## 2020-10-20 NOTE — Assessment & Plan Note (Signed)
fall 10/18/20 when the patient was observed on the mat beside the bed. The patient has no recollection of the event, no apparent injury noted.   Frequent falls  Close supervision/assistance for safety.

## 2020-10-20 NOTE — Assessment & Plan Note (Signed)
,   takes Sertraline, Quetiapine, Depakote, Lorazepam

## 2020-10-20 NOTE — Assessment & Plan Note (Signed)
akes Glimepiride, Hgb a1c 5.5 04/07/20

## 2020-10-20 NOTE — Assessment & Plan Note (Signed)
takes MiraLax prn, Colace bid

## 2020-10-20 NOTE — Progress Notes (Signed)
Location:   S[facility] Room Number: [place] of Service:  SNF (31) Provider: [facility] Jalana Moore NP  [name], MD  Patient Care Team: [name], MD as PCP - General (Internal Medicine)  Extended Emergency Contact Information Primary Emergency Contact: Harris,Margaret Address: [address]          [place], [place] [address] [name] of [place] Phone: [phone] Mobile Phone: [phone] Relation: Daughter  Code Status:  DNR Goals of care: Advanced Directive information Advanced Directives 10/20/2020  Does Patient Have a Medical Advance Directive? Yes  Type of Paramedic of [place];Living will;Out of facility DNR (pink MOST or yellow form)  Does patient want to make changes to medical advance directive? No - Patient declined  Copy of [place] in Chart? Yes - validated most recent copy scanned in chart (See row information)  Would patient like information on creating a medical advance directive? -  Pre-existing out of facility DNR order (yellow form or pink MOST form) Yellow form placed in chart (order not valid for inpatient use)     Chief Complaint  Patient presents with   Acute Visit    Patient presents after a fall     HPI:  Pt is a 87 y.o. female seen today for an acute visit for fall 10/18/20 when the patient was observed on the mat beside the bed. The patient has no recollection of the event, no apparent injury noted.   Frequent falls   OA, s/p Closed left hip fracture, s/p ORIF 04/09/20. L arm/the lower end of left radius, s/p closed reduction. Takes Tylenol for pain.              Anemia, Hgb 11.3 07/17/20             Constipation, takes MiraLax prn, Colace bid             T2DM, takes Glimepiride, Hgb a1c 5.5 04/07/20             Hyperlipidemia, takes Atorvastatin. LDL 51 12/13/19             HTN/tachycardia, takes Metoprolol, Losartan, Bun/creat 23/0.6 07/17/20             Hypothyroidism, takes  Levothyroxine, TSH 3.509 12/29/19             Dementia, sundowning.              Depression/anxiety, takes Sertraline, Quetiapine, Depakote, Lorazepam    Past Medical History:  Diagnosis Date   Hearing loss    History of fracture of clavicle    Hypothyroidism    Insomnia    Tinnitus    Past Surgical History:  Procedure Laterality Date   CHOLECYSTECTOMY  2007   Dr. Verdene Lennert   CLAVICLE EXCISION  1986   INTRAMEDULLARY (IM) NAIL INTERTROCHANTERIC Left 04/09/2020   Procedure: LEFT INTERTROCHANTERIC INTRAMEDULLARY (IM) NAIL;  Surgeon: Leandrew Koyanagi, MD;  Location: [facility];  Service: Orthopedics;  Laterality: Left;   MOHS SURGERY     past 10 years chest & legs   OPEN REDUCTION INTERNAL FIXATION (ORIF) DISTAL RADIAL FRACTURE Left 04/09/2020   Procedure: CLOSED REDUCTION LEFT DISTAL RADIUS FRACTURE;  Surgeon: Leandrew Koyanagi, MD;  Location: [facility];  Service: Orthopedics;  Laterality: Left;    Allergies  Allergen Reactions   Metformin And Related Diarrhea    Allergies as of 10/20/2020       Reactions   Metformin And Related  Diarrhea        Medication List        Accurate as of October 20, 2020 11:59 PM. If you have any questions, ask your nurse or doctor.          acetaminophen 325 MG tablet Commonly known as: TYLENOL Take 650 mg by mouth 3 (three) times daily as needed.   atorvastatin 10 MG tablet Commonly known as: LIPITOR Take 10 mg by mouth daily.   divalproex 125 MG DR tablet Commonly known as: DEPAKOTE Take 125 mg by mouth 2 (two) times daily.   docusate sodium 100 MG capsule Commonly known as: COLACE Take 100 mg by mouth daily.   glimepiride 1 MG tablet Commonly known as: AMARYL Take 0.5 mg by mouth daily with breakfast.   levothyroxine 75 MCG tablet Commonly known as: SYNTHROID TAKE 1 TABLET (75 MCG TOTAL) BY MOUTH DAILY BEFORE BREAKFAST.   LORazepam 0.5 MG tablet Commonly known as: ATIVAN Take 1 tablet (0.5 mg total) by mouth 2 (two) times daily. PRN x 14  days for extreme agitation/anxiety.   losartan 25 MG tablet Commonly known as: COZAAR Take 12.5 mg by mouth daily.   metoprolol tartrate 25 MG tablet Commonly known as: LOPRESSOR Take 12.5 mg by mouth 2 (two) times daily.   polyethylene glycol 17 g packet Commonly known as: MIRALAX / GLYCOLAX Take 17 g by mouth daily as needed for moderate constipation.   QUEtiapine 25 MG tablet Commonly known as: SEROQUEL Take 25 mg by mouth at bedtime.   sertraline 25 MG tablet Commonly known as: ZOLOFT Take 50 mg by mouth daily.   Vitamin D3 50 MCG (2000 UT) Tabs Take 1 tablet by mouth daily.   zinc oxide 20 % ointment Apply 1 application topically as needed for irritation.       ROS was provided with assistance of staff.  Review of Systems  Constitutional:  Negative for activity change, appetite change and fever.  HENT:  Positive for hearing loss. Negative for congestion and voice change.   Eyes:  Negative for visual disturbance.  Respiratory:  Negative for shortness of breath.   Cardiovascular:  Positive for leg swelling.  Gastrointestinal:  Negative for abdominal pain and constipation.  Genitourinary:  Negative for difficulty urinating, dysuria and urgency.  Musculoskeletal:  Positive for arthralgias and gait problem. Negative for back pain.  Skin:  Negative for color change.  Neurological:  Negative for speech difficulty, weakness and light-headedness.       Confused.   Psychiatric/Behavioral:  Positive for behavioral problems and confusion. Negative for sleep disturbance. The patient is not nervous/anxious.    Immunization History  Administered Date(s) Administered   DTaP 07/20/2013   Influenza, High Dose Seasonal PF 12/07/2016   Influenza,inj,Quad PF,6+ Mos 12/01/2017   Influenza-Unspecified 11/20/2014, 12/12/2015, 11/20/2019   Moderna SARS-COV2 Booster Vaccination 08/06/2020   Moderna Sars-Covid-2 Vaccination 03/05/2019, 04/02/2019   PPD Test 08/06/2014   Pneumococcal  Conjugate-13 11/07/2013   Pneumococcal Polysaccharide-23 03/22/2017   Zoster Recombinat (Shingrix) 06/07/2017, 08/26/2017   Zoster, Live 03/01/2006   Pertinent  Health Maintenance Due  Topic Date Due   OPHTHALMOLOGY EXAM  Never done   INFLUENZA VACCINE  09/29/2020   HEMOGLOBIN A1C  10/05/2020   FOOT EXAM  07/23/2021   DEXA SCAN  Completed   PNA vac Low Risk Adult  Completed   Fall Risk  09/12/2020 12/19/2019 10/17/2019 08/22/2019 07/11/2019  Falls in the past year? 1 0 0 0 0  Number falls in past  yr: 1 0 0 0 0  Injury with Fall? 1 - - 0 -  Risk for fall due to : History of fall(s);Impaired balance/gait;Impaired mobility;Mental status change - - - -  Follow up Falls evaluation completed;Education provided;Falls prevention discussed - - - -   Functional Status Survey:    Vitals:   10/20/20 1154  BP: 123/70  Pulse: 97  Resp: (!) 21  Temp: 97.9 F (36.6 C)  SpO2: 97%  Weight: 129 lb 8 oz (58.7 kg)  Height: 5' (1.524 m)   Body mass index is 25.29 kg/m. Physical Exam Vitals and nursing note reviewed.  Constitutional:      Appearance: Normal appearance.  HENT:     Head: Normocephalic and atraumatic.     Mouth/Throat:     Mouth: Mucous membranes are moist.  Eyes:     Extraocular Movements: Extraocular movements intact.     Conjunctiva/sclera: Conjunctivae normal.     Pupils: Pupils are equal, round, and reactive to light.  Cardiovascular:     Rate and Rhythm: Normal rate and regular rhythm.     Heart sounds: No murmur heard. Pulmonary:     Effort: Pulmonary effort is normal.     Breath sounds: No rales.  Abdominal:     General: Bowel sounds are normal.     Palpations: Abdomen is soft.     Tenderness: There is no abdominal tenderness.  Musculoskeletal:     Cervical back: Normal range of motion and neck supple.     Right lower leg: No edema.     Left lower leg: No edema.  Skin:    General: Skin is warm and dry.     Comments: Left wrist in case/sling. Left hips  surgical scar  Neurological:     General: No focal deficit present.     Mental Status: She is alert. Mental status is at baseline.     Motor: No weakness.     Coordination: Coordination normal.     Gait: Gait abnormal.     Comments: Oriented to person, followed simple directions.   Psychiatric:        Mood and Affect: Mood normal.        Behavior: Behavior normal.    Labs reviewed: Recent Labs    04/10/20 0155 04/11/20 0136 04/14/20 0354 04/17/20 0000 05/01/20 0000 07/17/20 0000  NA 138 141 142 142 141 143  K 3.9 3.5 3.4* 3.7 4.2 4.2  CL 104 108 103 107 106 107  CO2 '23 23 25 '$ 26* 26* 27*  GLUCOSE 150* 106* 101*  --   --   --   BUN '12 16 17 17 '$ 23* 23*  CREATININE 0.62 0.57 0.61 0.5 0.5 0.6  CALCIUM 8.5* 8.0* 8.3* 8.0* 9.0 8.7   Recent Labs    12/13/19 0000 01/10/20 0000 04/17/20 0000 05/01/20 0000 07/17/20 0000  AST 15   < > 16 13 12*  ALT 11   < > '11 11 7  '$ ALKPHOS  --    < > 74 127* 79  BILITOT 0.4  --   --   --   --   PROT 6.5  --   --   --   --   ALBUMIN  --    < > 2.6* 3.5 3.3*   < > = values in this interval not displayed.   Recent Labs    04/11/20 0136 04/12/20 0200 04/12/20 1841 04/14/20 0354 05/01/20 0000 06/09/20 0000 07/17/20 0000  WBC 10.2 9.0  --  11.0* 7.9 9.8 9.0  NEUTROABS  --   --   --   --  4,471.00 5,772.00 5,517.00  HGB 8.1* 7.5*   < > 9.4* 11.2* 12.1 11.3*  HCT 26.2* 23.4*   < > 30.9* 35* 38 36  MCV 84.8 83.9  --  85.8  --   --   --   PLT 276 270  --  317 322 299 258   < > = values in this interval not displayed.   Lab Results  Component Value Date   TSH 3.509 12/29/2019   Lab Results  Component Value Date   HGBA1C 5.5 04/07/2020   Lab Results  Component Value Date   CHOL 95 04/17/2020   HDL 22 (A) 04/17/2020   LDLCALC 50 04/17/2020   TRIG 150 04/17/2020   CHOLHDL 3.0 12/13/2019    Significant Diagnostic Results in last 30 days:  No results found.  Assessment/Plan Frequent falls  fall 10/18/20 when the patient was  observed on the mat beside the bed. The patient has no recollection of the event, no apparent injury noted.   Frequent falls  Close supervision/assistance for safety.   Osteoarthritis, multiple sites OA, s/p Closed left hip fracture, s/p ORIF 04/09/20. L arm/the lower end of left radius, s/p closed reduction. Takes Tylenol for pain.   Anemia, blood loss Hgb 11.3 07/17/20  Slow transit constipation  takes MiraLax prn, Colace bid  Diabetes mellitus type 2 in nonobese (HCC) akes Glimepiride, Hgb a1c 5.5 04/07/20  Mixed hyperlipidemia takes Atorvastatin. LDL 51 12/13/19  HTN (hypertension) Blood pressure is controlled, takes Metoprolol, Losartan, Bun/creat 23/0.6 07/17/20  Hypothyroidism takes Levothyroxine, TSH 3.509 12/29/19  Depression with anxiety , takes Sertraline, Quetiapine, Depakote, Lorazepam    Family/ staff Communication: plan of care reviewed with the patient and charge nurse.   Labs/tests ordered:  none   Time spend 25 minutes.

## 2020-10-22 ENCOUNTER — Encounter: Payer: Self-pay | Admitting: Nurse Practitioner

## 2020-11-19 ENCOUNTER — Non-Acute Institutional Stay (SKILLED_NURSING_FACILITY): Payer: Medicare Other | Admitting: Orthopedic Surgery

## 2020-11-19 ENCOUNTER — Encounter: Payer: Self-pay | Admitting: Orthopedic Surgery

## 2020-11-19 DIAGNOSIS — F01518 Vascular dementia, unspecified severity, with other behavioral disturbance: Secondary | ICD-10-CM

## 2020-11-19 DIAGNOSIS — H6121 Impacted cerumen, right ear: Secondary | ICD-10-CM

## 2020-11-19 DIAGNOSIS — E782 Mixed hyperlipidemia: Secondary | ICD-10-CM | POA: Diagnosis not present

## 2020-11-19 DIAGNOSIS — F0151 Vascular dementia with behavioral disturbance: Secondary | ICD-10-CM | POA: Diagnosis not present

## 2020-11-19 DIAGNOSIS — I10 Essential (primary) hypertension: Secondary | ICD-10-CM | POA: Diagnosis not present

## 2020-11-19 DIAGNOSIS — K5901 Slow transit constipation: Secondary | ICD-10-CM | POA: Diagnosis not present

## 2020-11-19 DIAGNOSIS — F418 Other specified anxiety disorders: Secondary | ICD-10-CM

## 2020-11-19 DIAGNOSIS — E119 Type 2 diabetes mellitus without complications: Secondary | ICD-10-CM

## 2020-11-19 DIAGNOSIS — E039 Hypothyroidism, unspecified: Secondary | ICD-10-CM | POA: Diagnosis not present

## 2020-11-19 DIAGNOSIS — Z23 Encounter for immunization: Secondary | ICD-10-CM | POA: Diagnosis not present

## 2020-11-19 MED ORDER — LORAZEPAM 0.5 MG PO TABS: 0.5000 mg | ORAL_TABLET | Freq: Two times a day (BID) | ORAL | 0 refills | Status: AC

## 2020-11-19 NOTE — Progress Notes (Signed)
Location:  [facility] Room Number: [place] of Service:  SNF [phone]) Provider:  Windell Moulding, AGNP-C  [name], MD  Patient Care Team: [name], MD as PCP - General (Internal Medicine)  Extended Emergency Contact Information Primary Emergency Contact: Harris,Margaret Address: [address]          [place], [place] [address] [name] of [place] Phone: [phone] Mobile Phone: [phone] Relation: Daughter  Code Status: DNR Goals of care: Advanced Directive information Advanced Directives 10/20/2020  Does Patient Have a Medical Advance Directive? Yes  Type of Paramedic of [place];Living will;Out of facility DNR (pink MOST or yellow form)  Does patient want to make changes to medical advance directive? No - Patient declined  Copy of [place] in Chart? Yes - validated most recent copy scanned in chart (See row information)  Would patient like information on creating a medical advance directive? -  Pre-existing out of facility DNR order (yellow form or pink MOST form) Yellow form placed in chart (order not valid for inpatient use)     Chief Complaint  Patient presents with   Medical Management of Chronic Issues    HPI:  Pt is a 87 y.o. female seen today for medical management of chronic diseases.    She currently resides on the skilled nursing unit at [facility]. Past medical includes: HTN, constipation, T2DM, hypothyroidism, OA, urinary retention, vascular dementia, depression and anxiety.   Daughter present during encounter.   Dementia- BIMS score 5 09/2020, behaviors continue to vary in the past month, nursing reports increased wandering and agitation in early morning or late afternoon, she had a visit from her brother the other day and was not able to carry on conversation per daughter, ativan 0.5 mg prn, receives Depakote and Seroquel daily HTN- BUN/creat 23/0.6 07/17/2020, remains  on metoprolol and losartan T2DM- a1c 5.5 04/07/2020, blood sugars averaging 95-110, remains on Amaryl HLD- LDL 50 04/17/2020, remains on Lipitor Constipation- LBM 09/21, colace daily, miralax prn Hypothyroidism- TSH 3.509 11/2019, levothyroxine  Depression/anxiety- no recent panic attacks for increased depression, Zoloft daily, ativan prn Hearing- right hearing aid missing, daughter plans to get her fit for another tomorrow,  wax build up noted  08/20 she was found sitting on the floor. No apparent injury. No other falls reported. Ambulates in wheelchair.   Recent blood pressures:  09/20- 107/68, 138/62  09/19- 144/87  09/18- 122/62  Recent weights:  09/02- 130.5 lbs  08/01- 129.5 lbs  07/01- 131.5 lbs  Nurse does not report any concerns, vitals stable.     Past Medical History:  Diagnosis Date   Hearing loss    History of fracture of clavicle    Hypothyroidism    Insomnia    Tinnitus    Past Surgical History:  Procedure Laterality Date   CHOLECYSTECTOMY  2007   Dr. Verdene Lennert   CLAVICLE EXCISION  1986   INTRAMEDULLARY (IM) NAIL INTERTROCHANTERIC Left 04/09/2020   Procedure: LEFT INTERTROCHANTERIC INTRAMEDULLARY (IM) NAIL;  Surgeon: Leandrew Koyanagi, MD;  Location: [facility];  Service: Orthopedics;  Laterality: Left;   MOHS SURGERY     past 10 years chest & legs   OPEN REDUCTION INTERNAL FIXATION (ORIF) DISTAL RADIAL FRACTURE Left 04/09/2020   Procedure: CLOSED REDUCTION LEFT DISTAL RADIUS FRACTURE;  Surgeon: Leandrew Koyanagi, MD;  Location: [facility];  Service: Orthopedics;  Laterality: Left;    Allergies  Allergen Reactions   Metformin And  Related Diarrhea    Outpatient Encounter Medications as of 11/19/2020  Medication Sig   acetaminophen (TYLENOL) 325 MG tablet Take 650 mg by mouth 3 (three) times daily as needed.   atorvastatin (LIPITOR) 10 MG tablet Take 10 mg by mouth daily.   Cholecalciferol (VITAMIN D3) 2000 units TABS Take 1 tablet by mouth daily.    divalproex (DEPAKOTE) 125  MG DR tablet Take 125 mg by mouth 2 (two) times daily.   docusate sodium (COLACE) 100 MG capsule Take 100 mg by mouth daily.   glimepiride (AMARYL) 1 MG tablet Take 0.5 mg by mouth daily with breakfast.   levothyroxine (SYNTHROID) 75 MCG tablet TAKE 1 TABLET (75 MCG TOTAL) BY MOUTH DAILY BEFORE BREAKFAST.   LORazepam (ATIVAN) 0.5 MG tablet Take 1 tablet (0.5 mg total) by mouth 2 (two) times daily. PRN x 14 days for extreme agitation/anxiety.   losartan (COZAAR) 25 MG tablet Take 12.5 mg by mouth daily.   metoprolol tartrate (LOPRESSOR) 25 MG tablet Take 12.5 mg by mouth 2 (two) times daily.   polyethylene glycol (MIRALAX / GLYCOLAX) 17 g packet Take 17 g by mouth daily as needed for moderate constipation.   QUEtiapine (SEROQUEL) 25 MG tablet Take 25 mg by mouth at bedtime.   sertraline (ZOLOFT) 25 MG tablet Take 50 mg by mouth daily.    zinc oxide 20 % ointment Apply 1 application topically as needed for irritation.   No facility-administered encounter medications on file as of 11/19/2020.    Review of Systems  Unable to perform ROS: Dementia   Immunization History  Administered Date(s) Administered   DTaP 07/20/2013   Influenza, High Dose Seasonal PF 12/07/2016   Influenza,inj,Quad PF,6+ Mos 12/01/2017   Influenza-Unspecified 11/20/2014, 12/12/2015, 11/20/2019   Moderna SARS-COV2 Booster Vaccination 08/06/2020   Moderna Sars-Covid-2 Vaccination 03/05/2019, 04/02/2019   PPD Test 08/06/2014   Pneumococcal Conjugate-13 11/07/2013   Pneumococcal Polysaccharide-23 03/22/2017   Zoster Recombinat (Shingrix) 06/07/2017, 08/26/2017   Zoster, Live 03/01/2006   Pertinent  Health Maintenance Due  Topic Date Due   OPHTHALMOLOGY EXAM  Never done   INFLUENZA VACCINE  09/29/2020   HEMOGLOBIN A1C  10/05/2020   FOOT EXAM  07/23/2021   DEXA SCAN  Completed   Fall Risk  09/12/2020 12/19/2019 10/17/2019 08/22/2019 07/11/2019  Falls in the past year? 1 0 0 0 0  Number falls in past yr: 1 0 0 0 0   Injury with Fall? 1 - - 0 -  Risk for fall due to : History of fall(s);Impaired balance/gait;Impaired mobility;Mental status change - - - -  Follow up Falls evaluation completed;Education provided;Falls prevention discussed - - - -   Functional Status Survey:    Vitals:   11/19/20 1041  BP: 107/68  Pulse: 89  Resp: 18  Temp: (!) 96.3 F (35.7 C)  SpO2: 97%  Weight: 130 lb 8 oz (59.2 kg)  Height: 5' (1.524 m)   Body mass index is 25.49 kg/m. Physical Exam Vitals reviewed.  Constitutional:      General: She is not in acute distress. HENT:     Head: Normocephalic.     Right Ear: There is impacted cerumen.     Left Ear: There is no impacted cerumen.     Nose: Nose normal.     Mouth/Throat:     Mouth: Mucous membranes are moist.  Eyes:     General:        Right eye: No discharge.  Left eye: No discharge.  Neck:     Vascular: No carotid bruit.  Cardiovascular:     Rate and Rhythm: Normal rate and regular rhythm.     Pulses:          Dorsalis pedis pulses are 1+ on the right side and 1+ on the left side.       Posterior tibial pulses are 1+ on the right side and 1+ on the left side.     Heart sounds: Normal heart sounds.  Pulmonary:     Effort: Pulmonary effort is normal. No respiratory distress.     Breath sounds: Normal breath sounds. No wheezing.  Abdominal:     General: Abdomen is flat. Bowel sounds are normal. There is no distension.     Palpations: Abdomen is soft.     Tenderness: There is no abdominal tenderness.  Musculoskeletal:     Cervical back: Normal range of motion.     Right lower leg: No edema.     Left lower leg: No edema.  Feet:     Right foot:     Protective Sensation: 9 sites tested.  9 sites sensed.     Skin integrity: Skin integrity normal.     Toenail Condition: Fungal disease present.    Left foot:     Protective Sensation: 9 sites tested.  9 sites sensed.     Skin integrity: Skin integrity normal.     Toenail Condition: Fungal  disease present. Lymphadenopathy:     Cervical: No cervical adenopathy.  Skin:    General: Skin is warm and dry.     Capillary Refill: Capillary refill takes less than 2 seconds.  Neurological:     General: No focal deficit present.     Mental Status: She is alert. Mental status is at baseline.     Motor: Weakness present.     Gait: Gait abnormal.  Psychiatric:        Mood and Affect: Mood normal.        Behavior: Behavior normal.        Cognition and Memory: Memory is impaired.     Comments: Very pleasant, follows commands    Labs reviewed: Recent Labs    04/10/20 0155 04/11/20 0136 04/14/20 0354 04/17/20 0000 05/01/20 0000 07/17/20 0000  NA 138 141 142 142 141 143  K 3.9 3.5 3.4* 3.7 4.2 4.2  CL 104 108 103 107 106 107  CO2 23 23 25  26* 26* 27*  GLUCOSE 150* 106* 101*  --   --   --   BUN 12 16 17 17  23* 23*  CREATININE 0.62 0.57 0.61 0.5 0.5 0.6  CALCIUM 8.5* 8.0* 8.3* 8.0* 9.0 8.7   Recent Labs    12/13/19 0000 01/10/20 0000 04/17/20 0000 05/01/20 0000 07/17/20 0000  AST 15   < > 16 13 12*  ALT 11   < > 11 11 7   ALKPHOS  --    < > 74 127* 79  BILITOT 0.4  --   --   --   --   PROT 6.5  --   --   --   --   ALBUMIN  --    < > 2.6* 3.5 3.3*   < > = values in this interval not displayed.   Recent Labs    04/11/20 0136 04/12/20 0200 04/12/20 1841 04/14/20 0354 05/01/20 0000 06/09/20 0000 07/17/20 0000  WBC 10.2 9.0  --  11.0* 7.9 9.8 9.0  NEUTROABS  --   --   --   --  4,471.00 5,772.00 5,517.00  HGB 8.1* 7.5*   < > 9.4* 11.2* 12.1 11.3*  HCT 26.2* 23.4*   < > 30.9* 35* 38 36  MCV 84.8 83.9  --  85.8  --   --   --   PLT 276 270  --  317 322 299 258   < > = values in this interval not displayed.   Lab Results  Component Value Date   TSH 3.509 12/29/2019   Lab Results  Component Value Date   HGBA1C 5.5 04/07/2020   Lab Results  Component Value Date   CHOL 95 04/17/2020   HDL 22 (A) 04/17/2020   LDLCALC 50 04/17/2020   TRIG 150 04/17/2020    CHOLHDL 3.0 12/13/2019    Significant Diagnostic Results in last 30 days:  No results found.  Assessment/Plan 1. Vascular dementia with behavior disturbance ([place]) - increased behaviors of wandering and agitation in early AM or late afternoon - recommend memory care unit, daughter refuses- does not want to change environment - wander guard to left ankle - cont depakote and seroquel  - cont ativan prn - cont skilled nursing care  2. Primary hypertension - controlled - cont metoprolol and losartan - cbc/diff -cmp  3. Diabetes mellitus type 2 in nonobese (HCC) - a1c 5.5 04/2020 - morning blood sugars 95-110 - cont amaryl - a1c  4. Mixed hyperlipidemia - LDL 50 04/2020 - cont lipitor - lipid panel  5. Slow transit constipation - LBM 09/21, abdomen soft - cont colace daily and miralax prn  6. Acquired hypothyroidism - TSH 3.5 11/2020 - cont levothyroxine -TSH  7. Depression with anxiety - stable with sertraline and ativan prn  8. Impacted cerumen of right ear - cannot visualize right TM due to wax - debrox- 5 gtts bid x 5 days - flush ear with warm water when debrox complete    Family/ staff Communication: plan discussed with patient, daughter and nurse  Labs/tests ordered:  cbc/diff, cmp, a1c, lipid, TSH

## 2020-11-20 ENCOUNTER — Encounter: Payer: Self-pay | Admitting: Internal Medicine

## 2020-11-20 ENCOUNTER — Non-Acute Institutional Stay (SKILLED_NURSING_FACILITY): Payer: Medicare Other | Admitting: Internal Medicine

## 2020-11-20 DIAGNOSIS — F0151 Vascular dementia with behavioral disturbance: Secondary | ICD-10-CM | POA: Diagnosis not present

## 2020-11-20 DIAGNOSIS — E039 Hypothyroidism, unspecified: Secondary | ICD-10-CM

## 2020-11-20 DIAGNOSIS — R410 Disorientation, unspecified: Secondary | ICD-10-CM | POA: Diagnosis not present

## 2020-11-20 DIAGNOSIS — F418 Other specified anxiety disorders: Secondary | ICD-10-CM

## 2020-11-20 DIAGNOSIS — F01518 Vascular dementia, unspecified severity, with other behavioral disturbance: Secondary | ICD-10-CM

## 2020-11-20 DIAGNOSIS — E119 Type 2 diabetes mellitus without complications: Secondary | ICD-10-CM | POA: Diagnosis not present

## 2020-11-20 DIAGNOSIS — E782 Mixed hyperlipidemia: Secondary | ICD-10-CM | POA: Diagnosis not present

## 2020-11-20 DIAGNOSIS — E785 Hyperlipidemia, unspecified: Secondary | ICD-10-CM | POA: Diagnosis not present

## 2020-11-20 DIAGNOSIS — D649 Anemia, unspecified: Secondary | ICD-10-CM | POA: Diagnosis not present

## 2020-11-20 LAB — HEPATIC FUNCTION PANEL
ALT: 8 (ref 7–35)
AST: 13 (ref 13–35)
Alkaline Phosphatase: 65 (ref 25–125)
Bilirubin, Total: 0.4

## 2020-11-20 LAB — LIPID PANEL
Cholesterol: 96 (ref 0–200)
HDL: 33 — AB (ref 35–70)
LDL Cholesterol: 45
Triglycerides: 94 (ref 40–160)

## 2020-11-20 LAB — CBC AND DIFFERENTIAL
HCT: 34 — AB (ref 36–46)
Hemoglobin: 10.7 — AB (ref 12.0–16.0)
Neutrophils Absolute: 5730
WBC: 7.1

## 2020-11-20 LAB — BASIC METABOLIC PANEL
BUN: 20 (ref 4–21)
CO2: 26 — AB (ref 13–22)
Chloride: 105 (ref 99–108)
Creatinine: 0.7 (ref 0.5–1.1)
Glucose: 99
Potassium: 3.7 (ref 3.4–5.3)
Sodium: 141 (ref 137–147)

## 2020-11-20 LAB — COMPREHENSIVE METABOLIC PANEL
Albumin: 3.2 — AB (ref 3.5–5.0)
Calcium: 8.4 — AB (ref 8.7–10.7)
Globulin: 2.3

## 2020-11-20 LAB — CBC: RBC: 3.98 (ref 3.87–5.11)

## 2020-11-20 NOTE — Progress Notes (Signed)
Location: [place] Room Number: 31 Place of Service:  SNF (31)  Provider:   Code Status: DNR Goals of Care:  Advanced Directives 10/20/2020  Does Patient Have a Medical Advance Directive? Yes  Type of Paramedic of [place];Living will;Out of facility DNR (pink MOST or yellow form)  Does patient want to make changes to medical advance directive? No - Patient declined  Copy of [place] in Chart? Yes - validated most recent copy scanned in chart (See row information)  Would patient like information on creating a medical advance directive? -  Pre-existing out of facility DNR order (yellow form or pink MOST form) Yellow form placed in chart (order not valid for inpatient use)     Chief Complaint  Patient presents with   Acute Visit    Lethargy    HPI: [name] a 87 y.o. female seen today for an acute visit for Lethargy Patient did get a Covid Booster yesterday  Patient has a history of type 2 diabetes, hyperlipidemia, depression, dementia, hypothyroidism, osteopenia, stress and urge urinary incontinence, Tachycardia She also has    Now in SNF for long term Care  Patient has not been feeling well this morning. C/o Pain in her Arm where she got the Vaccination Nurse also had to give her Ativan 0.5 mg due to agitation When daughter came to see her she was lethargic. 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 No Cough or SOB No Pain   Past Medical History:  Diagnosis Date   Hearing loss    History of fracture of clavicle    Hypothyroidism    Insomnia    Tinnitus     Past Surgical History:  Procedure Laterality Date   CHOLECYSTECTOMY  2007   Dr. Verdene Lennert   CLAVICLE EXCISION  1986   INTRAMEDULLARY (IM) NAIL INTERTROCHANTERIC Left 04/09/2020   Procedure: LEFT INTERTROCHANTERIC INTRAMEDULLARY (IM) NAIL;  Surgeon: Leandrew Koyanagi, MD;  Location: [facility];  Service: Orthopedics;  Laterality: Left;   MOHS SURGERY      past 10 years chest & legs   OPEN REDUCTION INTERNAL FIXATION (ORIF) DISTAL RADIAL FRACTURE Left 04/09/2020   Procedure: CLOSED REDUCTION LEFT DISTAL RADIUS FRACTURE;  Surgeon: Leandrew Koyanagi, MD;  Location: [facility];  Service: Orthopedics;  Laterality: Left;    Allergies  Allergen Reactions   Metformin And Related Diarrhea    Outpatient Encounter Medications as of 11/20/2020  Medication Sig   acetaminophen (TYLENOL) 325 MG tablet Take 650 mg by mouth 3 (three) times daily as needed.   atorvastatin (LIPITOR) 10 MG tablet Take 10 mg by mouth daily.   Cholecalciferol (VITAMIN D3) 2000 units TABS Take 1 tablet by mouth daily.    divalproex (DEPAKOTE) 125 MG DR tablet Take 125 mg by mouth 2 (two) times daily.   docusate sodium (COLACE) 100 MG capsule Take 100 mg by mouth daily.   glimepiride (AMARYL) 1 MG tablet Take 0.5 mg by mouth daily with breakfast.   levothyroxine (SYNTHROID) 75 MCG tablet TAKE 1 TABLET (75 MCG TOTAL) BY MOUTH DAILY BEFORE BREAKFAST.   LORazepam (ATIVAN) 0.5 MG tablet Take 1 tablet (0.5 mg total) by mouth 2 (two) times daily. PRN x 14 days for extreme agitation/anxiety.   losartan (COZAAR) 25 MG tablet Take 12.5 mg by mouth daily.   metoprolol tartrate (LOPRESSOR) 25 MG tablet Take 12.5 mg by mouth 2 (two) times daily.   polyethylene glycol (MIRALAX / GLYCOLAX) 17 g packet  Take 17 g by mouth daily as needed for moderate constipation.   QUEtiapine (SEROQUEL) 25 MG tablet Take 25 mg by mouth at bedtime.   sertraline (ZOLOFT) 25 MG tablet Take 50 mg by mouth daily.    zinc oxide 20 % ointment Apply 1 application topically as needed for irritation.   No facility-administered encounter medications on file as of 11/20/2020.    Review of Systems:  Review of Systems  Reason unable to perform ROS: Patient denies everything but doe shave Dementia.   Health Maintenance  Topic Date Due   OPHTHALMOLOGY EXAM  Never done   INFLUENZA VACCINE  09/29/2020   HEMOGLOBIN A1C  10/05/2020    TETANUS/TDAP  11/24/2026 (Originally 08/19/1947)   COVID-19 Vaccine (4 - Booster for Moderna series) 12/06/2020   FOOT EXAM  07/23/2021   DEXA SCAN  Completed   Zoster Vaccines- Shingrix  Completed   HPV VACCINES  Aged Out    Physical Exam: Vitals:   11/20/20 1157  BP: (!) 104/56  Pulse: 89  Resp: 18  Temp: 97.9 F (36.6 C)  SpO2: 94%  Weight: 130 lb 8 oz (59.2 kg)  Height: 5' (1.524 m)   Body mass index is 25.49 kg/m. Physical Exam Constitutional:  Well-developed and well-nourished.  HENT:  Head: Normocephalic.  Mouth/Throat: Oropharynx is clear and moist.  Eyes: Pupils are equal, round, and reactive to light.  Neck: Neck supple.  Cardiovascular: Normal rate and normal heart sounds.  No murmur heard. Pulmonary/Chest: Effort normal and breath sounds normal. No respiratory distress. No wheezes. She has no rales.  Abdominal: Soft. Bowel sounds are normal. No distension. There is no tenderness. There is no rebound.  Musculoskeletal: No edema.  Lymphadenopathy: none Neurological: Alert Responds but seems Sleepy Was following Commands and Moving all extremities No Facial Drooping But her Speech is Slightly Slurred and unable to come up with right words Skin: Skin is warm and dry.  Psychiatric: Normal mood and affect. Behavior is normal. Thought content normal.    Labs reviewed: Basic Metabolic Panel: Recent Labs    12/29/19 0606 01/10/20 0000 04/10/20 0155 04/11/20 0136 04/14/20 0354 04/17/20 0000 05/01/20 0000 07/17/20 0000  NA  --    < > 138 141 142 142 141 143  K  --    < > 3.9 3.5 3.4* 3.7 4.2 4.2  CL  --    < > 104 108 103 107 106 107  CO2  --    < > 23 23 25  26* 26* 27*  GLUCOSE  --    < > 150* 106* 101*  --   --   --   BUN  --    < > 12 16 17 17  23* 23*  CREATININE  --    < > 0.62 0.57 0.61 0.5 0.5 0.6  CALCIUM  --    < > 8.5* 8.0* 8.3* 8.0* 9.0 8.7  TSH 3.509  --   --   --   --   --   --   --    < > = values in this interval not displayed.    Liver Function Tests: Recent Labs    12/13/19 0000 01/10/20 0000 04/17/20 0000 05/01/20 0000 07/17/20 0000  AST 15   < > 16 13 12*  ALT 11   < > 11 11 7   ALKPHOS  --    < > 74 127* 79  BILITOT 0.4  --   --   --   --  PROT 6.5  --   --   --   --   ALBUMIN  --    < > 2.6* 3.5 3.3*   < > = values in this interval not displayed.   No results for input(s): LIPASE, AMYLASE in the last 8760 hours. No results for input(s): AMMONIA in the last 8760 hours. CBC: Recent Labs    04/11/20 0136 04/12/20 0200 04/12/20 1841 04/14/20 0354 05/01/20 0000 06/09/20 0000 07/17/20 0000  WBC 10.2 9.0  --  11.0* 7.9 9.8 9.0  NEUTROABS  --   --   --   --  4,471.00 5,772.00 5,517.00  HGB 8.1* 7.5*   < > 9.4* 11.2* 12.1 11.3*  HCT 26.2* 23.4*   < > 30.9* 35* 38 36  MCV 84.8 83.9  --  85.8  --   --   --   PLT 276 270  --  317 322 299 258   < > = values in this interval not displayed.   Lipid Panel: Recent Labs    12/13/19 0000 04/17/20 0000  CHOL 109 95  HDL 36* 22*  LDLCALC 51 50  TRIG 132 150  CHOLHDL 3.0  --    Lab Results  Component Value Date   HGBA1C 5.5 04/07/2020    Procedures since last visit: No results found.  Assessment/Plan  Disorientation Patient has done this before with her Previous Covid Booster Will do CMP and CBC Hold Seroquel and Ativan today. If no Improvement Consider sending to ED for TIA Discussed with the Nurses  Vascular dementia with behavior disturbance (HCC) Will Hold Seroquel today Also going forward will decrase the dose of Ativan to 0.25 mg PRN  Per Nurses patient gets very agitated in the evening and resists care and risk of falls Diabetes mellitus type 2 in nonobese (HCC) Check A1C CBG was more then 100 today On Amaryl Low dose Mixed hyperlipidemia On statin Acquired hypothyroidism Will repeat TSH also today Depression with anxiety On Zoloft Sinus tachycardia On Low dose of metoprolol     Labs/tests ordered:  CBC,CMP ordered  stat Next appt:  Visit date not found  Discussed with Daughter and Nurses

## 2020-11-21 LAB — COMPREHENSIVE METABOLIC PANEL
Albumin: 3.2 — AB (ref 3.5–5.0)
Albumin: 3.3 — AB (ref 3.5–5.0)
Calcium: 8.4 — AB (ref 8.7–10.7)
Calcium: 8.6 — AB (ref 8.7–10.7)
Globulin: 2.3
Globulin: 2.3

## 2020-11-21 LAB — CBC AND DIFFERENTIAL
HCT: 34 — AB (ref 36–46)
HCT: 34 — AB (ref 36–46)
Hemoglobin: 10.6 — AB (ref 12.0–16.0)
Hemoglobin: 10.7 — AB (ref 12.0–16.0)
Neutrophils Absolute: 4821
Neutrophils Absolute: 5730
Platelets: 218 (ref 150–399)
Platelets: 220 (ref 150–399)
WBC: 6.8
WBC: 7.1

## 2020-11-21 LAB — LIPID PANEL
Cholesterol: 96 (ref 0–200)
Cholesterol: 96 (ref 0–200)
HDL: 33 — AB (ref 35–70)
HDL: 33 — AB (ref 35–70)
LDL Cholesterol: 45
LDL Cholesterol: 45
LDl/HDL Ratio: 2.9
LDl/HDL Ratio: 2.9
Triglycerides: 94 (ref 40–160)
Triglycerides: 94 (ref 40–160)

## 2020-11-21 LAB — BASIC METABOLIC PANEL
BUN: 20 (ref 4–21)
BUN: 20 (ref 4–21)
CO2: 26 — AB (ref 13–22)
CO2: 26 — AB (ref 13–22)
Chloride: 105 (ref 99–108)
Chloride: 105 (ref 99–108)
Creatinine: 0.7 (ref 0.5–1.1)
Creatinine: 0.7 (ref 0.5–1.1)
Glucose: 99
Glucose: 99
Potassium: 3.7 (ref 3.4–5.3)
Potassium: 3.9 (ref 3.4–5.3)
Sodium: 139 (ref 137–147)
Sodium: 141 (ref 137–147)

## 2020-11-21 LAB — CBC
RBC: 3.98 (ref 3.87–5.11)
RBC: 3.99 (ref 3.87–5.11)

## 2020-11-21 LAB — HEPATIC FUNCTION PANEL
ALT: 8 (ref 7–35)
ALT: 9 (ref 7–35)
AST: 13 (ref 13–35)
AST: 15 (ref 13–35)
Alkaline Phosphatase: 63 (ref 25–125)
Alkaline Phosphatase: 65 (ref 25–125)
Bilirubin, Total: 0.4
Bilirubin, Total: 0.5

## 2020-11-21 LAB — HEMOGLOBIN A1C
Hemoglobin A1C: 5.3
Hemoglobin A1C: 5.3

## 2020-11-21 LAB — TSH
TSH: 0.64 (ref 0.41–5.90)
TSH: 0.69 (ref 0.41–5.90)

## 2020-11-25 ENCOUNTER — Non-Acute Institutional Stay (SKILLED_NURSING_FACILITY): Payer: Medicare Other | Admitting: Orthopedic Surgery

## 2020-11-25 ENCOUNTER — Encounter: Payer: Self-pay | Admitting: Orthopedic Surgery

## 2020-11-25 DIAGNOSIS — E119 Type 2 diabetes mellitus without complications: Secondary | ICD-10-CM

## 2020-11-25 DIAGNOSIS — F0151 Vascular dementia with behavioral disturbance: Secondary | ICD-10-CM | POA: Diagnosis not present

## 2020-11-25 DIAGNOSIS — E782 Mixed hyperlipidemia: Secondary | ICD-10-CM

## 2020-11-25 DIAGNOSIS — F418 Other specified anxiety disorders: Secondary | ICD-10-CM | POA: Diagnosis not present

## 2020-11-25 DIAGNOSIS — F0391 Unspecified dementia with behavioral disturbance: Secondary | ICD-10-CM | POA: Diagnosis not present

## 2020-11-25 DIAGNOSIS — E039 Hypothyroidism, unspecified: Secondary | ICD-10-CM | POA: Diagnosis not present

## 2020-11-25 DIAGNOSIS — F03911 Unspecified dementia, unspecified severity, with agitation: Secondary | ICD-10-CM

## 2020-11-25 DIAGNOSIS — I10 Essential (primary) hypertension: Secondary | ICD-10-CM | POA: Diagnosis not present

## 2020-11-25 DIAGNOSIS — K5901 Slow transit constipation: Secondary | ICD-10-CM | POA: Diagnosis not present

## 2020-11-25 DIAGNOSIS — F01518 Vascular dementia, unspecified severity, with other behavioral disturbance: Secondary | ICD-10-CM

## 2020-11-25 NOTE — Progress Notes (Signed)
Location:   [facility] [place] Room Number: 63 Place of Service:  SNF [phone]) Provider: Windell Moulding, NP    Patient Care Team: [name], MD as PCP - General (Internal Medicine)  Extended Emergency Contact Information Primary Emergency Contact: Harris,Margaret Address: [place], [place] [address] [name] of [place] Phone: [phone] Mobile Phone: [phone] Relation: Daughter  Code Status:  DNR Goals of care: Advanced Directive information Advanced Directives 11/25/2020  Does Patient Have a Medical Advance Directive? Yes  Type of Paramedic of [place];Living will;Out of facility DNR (pink MOST or yellow form)  Does patient want to make changes to medical advance directive? No - Patient declined  Copy of [place] in Chart? Yes - validated most recent copy scanned in chart (See row information)  Would patient like information on creating a medical advance directive? -  Pre-existing out of facility DNR order (yellow form or pink MOST form) -     Chief Complaint  Patient presents with   Acute Visit    Increased agitation.     HPI:  Pt is a 87 y.o. female seen today for an acute visit for increased agitation.   She currently resides on the skilled nursing unit at [facility]. Past medical includes: HTN, constipation, T2DM, hypothyroidism, OA, urinary retention, vascular dementia, depression and anxiety.   She has had increased agitation since 09/24. She has been displaying exit seeking behavior, yelling curse words in the hallway and refusing to take her medication. Ativan decreased by Dr. Lyndel Safe 09/23, daughter reported she was lethargic. In the past month, she has had increased behaviors in the AM and PM. Remains on Depakote and Seroquel. Today, she is alert, pleasant, and follows commands. She had trouble engaging in our conversation and gave long pauses when asked a question.  Treatment options discussed with daughter. She would like Ativan increased back to previous dose. She would like to be contacted if she has AM agitation before being given Ativan.   Dementia- BIMS 5 09/2020, CT head 04/2020 age related atrophy and chronic small vessel ischemia HTN- BUN/creat 23/0.6 07/17/2020, remains on metoprolol and losartan T2DM- a1c 5.5 04/07/2020, blood sugars averaging 95-110, remains on Amaryl HLD- LDL 50 04/17/2020, remains on Lipitor Constipation- LBM 09/21, colace daily, miralax prn Hypothyroidism- TSH 3.509 11/2019, levothyroxine  Depression/anxiety- no recent panic attacks for increased depression, Zoloft daily, ativan prn  Past Medical History:  Diagnosis Date   Hearing loss    History of fracture of clavicle    Hypothyroidism    Insomnia    Tinnitus    Past Surgical History:  Procedure Laterality Date   CHOLECYSTECTOMY  2007   Dr. Verdene Lennert   CLAVICLE EXCISION  1986   INTRAMEDULLARY (IM) NAIL INTERTROCHANTERIC Left 04/09/2020   Procedure: LEFT INTERTROCHANTERIC INTRAMEDULLARY (IM) NAIL;  Surgeon: Leandrew Koyanagi, MD;  Location: [facility];  Service: Orthopedics;  Laterality: Left;   MOHS SURGERY     past 10 years chest & legs   OPEN REDUCTION INTERNAL FIXATION (ORIF) DISTAL RADIAL FRACTURE Left 04/09/2020   Procedure: CLOSED REDUCTION LEFT DISTAL RADIUS FRACTURE;  Surgeon: Leandrew Koyanagi, MD;  Location: [facility];  Service: Orthopedics;  Laterality: Left;    Allergies  Allergen Reactions   Metformin And Related Diarrhea    Allergies as of 11/25/2020       Reactions   Metformin And Related Diarrhea  Medication List        Accurate as of November 25, 2020 11:27 AM. If you have any questions, ask your nurse or doctor.          acetaminophen 325 MG tablet Commonly known as: TYLENOL Take 650 mg by mouth 3 (three) times daily as needed.   atorvastatin 10 MG tablet Commonly known as: LIPITOR Take 10 mg by mouth daily.   divalproex 125 MG DR  tablet Commonly known as: DEPAKOTE Take 125 mg by mouth 2 (two) times daily.   docusate sodium 100 MG capsule Commonly known as: COLACE Take 100 mg by mouth daily.   glimepiride 1 MG tablet Commonly known as: AMARYL Take 0.5 mg by mouth daily with breakfast.   levothyroxine 75 MCG tablet Commonly known as: SYNTHROID TAKE 1 TABLET (75 MCG TOTAL) BY MOUTH DAILY BEFORE BREAKFAST.   LORazepam 0.5 MG tablet Commonly known as: ATIVAN Take 1 tablet (0.5 mg total) by mouth 2 (two) times daily. PRN x 14 days for extreme agitation/anxiety.   losartan 25 MG tablet Commonly known as: COZAAR Take 12.5 mg by mouth daily.   metoprolol tartrate 25 MG tablet Commonly known as: LOPRESSOR Take 12.5 mg by mouth 2 (two) times daily.   polyethylene glycol 17 g packet Commonly known as: MIRALAX / GLYCOLAX Take 17 g by mouth daily as needed for moderate constipation.   QUEtiapine 25 MG tablet Commonly known as: SEROQUEL Take 25 mg by mouth at bedtime.   sertraline 50 MG tablet Commonly known as: ZOLOFT Take 50 mg by mouth daily. What changed: Another medication with the same name was removed. Continue taking this medication, and follow the directions you see here. Changed by: Yvonna Alanis, NP   Vitamin D3 50 MCG (2000 UT) Tabs Take 1 tablet by mouth daily.   zinc oxide 20 % ointment Apply 1 application topically as needed for irritation.        Review of Systems  Unable to perform ROS: Dementia   Immunization History  Administered Date(s) Administered   DTaP 07/20/2013   Influenza, High Dose Seasonal PF 12/07/2016   Influenza,inj,Quad PF,6+ Mos 12/01/2017   Influenza-Unspecified 11/20/2014, 12/12/2015, 11/20/2019   Moderna SARS-COV2 Booster Vaccination 08/06/2020   Moderna Sars-Covid-2 Vaccination 03/05/2019, 04/02/2019   PPD Test 08/06/2014   Pneumococcal Conjugate-13 11/07/2013   Pneumococcal Polysaccharide-23 03/22/2017   Zoster Recombinat (Shingrix) 06/07/2017, 08/26/2017    Zoster, Live 03/01/2006   Pertinent  Health Maintenance Due  Topic Date Due   OPHTHALMOLOGY EXAM  Never done   INFLUENZA VACCINE  09/29/2020   HEMOGLOBIN A1C  10/05/2020   FOOT EXAM  07/23/2021   DEXA SCAN  Completed   Fall Risk  09/12/2020 12/19/2019 10/17/2019 08/22/2019 07/11/2019  Falls in the past year? 1 0 0 0 0  Number falls in past yr: 1 0 0 0 0  Injury with Fall? 1 - - 0 -  Risk for fall due to : History of fall(s);Impaired balance/gait;Impaired mobility;Mental status change - - - -  Follow up Falls evaluation completed;Education provided;Falls prevention discussed - - - -   Functional Status Survey:    Vitals:   11/25/20 1114  BP: 134/78  Pulse: 92  Resp: 18  Temp: (!) 97.2 F (36.2 C)  SpO2: 96%  Weight: 130 lb 8 oz (59.2 kg)  Height: 5' (1.524 m)   Body mass index is 25.49 kg/m. Physical Exam Vitals reviewed.  Constitutional:      General: She is not in  acute distress. HENT:     Head: Normocephalic.  Eyes:     General:        Right eye: No discharge.        Left eye: No discharge.  Neck:     Vascular: No carotid bruit.  Cardiovascular:     Rate and Rhythm: Normal rate and regular rhythm.     Pulses: Normal pulses.     Heart sounds: Normal heart sounds. No murmur heard. Pulmonary:     Effort: Pulmonary effort is normal. No respiratory distress.     Breath sounds: Normal breath sounds. No wheezing.  Abdominal:     General: Bowel sounds are normal. There is no distension.     Palpations: Abdomen is soft.     Tenderness: There is no abdominal tenderness.  Musculoskeletal:     Cervical back: Normal range of motion.     Right lower leg: No edema.     Left lower leg: No edema.  Lymphadenopathy:     Cervical: No cervical adenopathy.  Skin:    General: Skin is warm and dry.     Capillary Refill: Capillary refill takes less than 2 seconds.  Neurological:     General: No focal deficit present.     Mental Status: She is alert. Mental status is at  baseline.     Motor: Weakness present.     Gait: Gait abnormal.     Comments: wheelchair  Psychiatric:        Mood and Affect: Mood normal. Affect is flat.        Behavior: Behavior normal.        Cognition and Memory: Memory is impaired.    Labs reviewed: Recent Labs    04/10/20 0155 04/11/20 0136 04/14/20 0354 04/17/20 0000 05/01/20 0000 07/17/20 0000 11/20/20 0000  NA 138 141 142   < > 141 143 141  K 3.9 3.5 3.4*   < > 4.2 4.2 3.7  CL 104 108 103   < > 106 107 105  CO2 23 23 25    < > 26* 27* 26*  GLUCOSE 150* 106* 101*  --   --   --   --   BUN 12 16 17    < > 23* 23* 20  CREATININE 0.62 0.57 0.61   < > 0.5 0.6 0.7  CALCIUM 8.5* 8.0* 8.3*   < > 9.0 8.7 8.4*   < > = values in this interval not displayed.   Recent Labs    12/13/19 0000 01/10/20 0000 05/01/20 0000 07/17/20 0000 11/20/20 0000  AST 15   < > 13 12* 13  ALT 11   < > 11 7 8   ALKPHOS  --    < > 127* 79 65  BILITOT 0.4  --   --   --   --   PROT 6.5  --   --   --   --   ALBUMIN  --    < > 3.5 3.3* 3.2*   < > = values in this interval not displayed.   Recent Labs    04/11/20 0136 04/12/20 0200 04/12/20 1841 04/14/20 0354 05/01/20 0000 06/09/20 0000 07/17/20 0000 11/20/20 0000  WBC 10.2 9.0  --  11.0* 7.9 9.8 9.0 7.1  NEUTROABS  --   --   --   --  4,471.00 5,772.00 5,517.00 5,730.00  HGB 8.1* 7.5*   < > 9.4* 11.2* 12.1 11.3* 10.7*  HCT 26.2* 23.4*   < >  30.9* 35* 38 36 34*  MCV 84.8 83.9  --  85.8  --   --   --   --   PLT 276 270  --  317 322 299 258  --    < > = values in this interval not displayed.   Lab Results  Component Value Date   TSH 3.509 12/29/2019   Lab Results  Component Value Date   HGBA1C 5.5 04/07/2020   Lab Results  Component Value Date   CHOL 96 11/20/2020   HDL 33 (A) 11/20/2020   LDLCALC 45 11/20/2020   TRIG 94 11/20/2020   CHOLHDL 3.0 12/13/2019    Significant Diagnostic Results in last 30 days:  No results found.  Assessment/Plan 1. Agitation due to  dementia [facility]) - exit seeking and yelling at staff - will increase ativan 0.5 mg po qhs - ativan 0.5 mg daily prn- please call daughter before administering - cont wander guard to foot  2. Vascular dementia with behavior disturbance ([place]) - BIMS 5 09/2020 - CT head 04/2020- chronic small vessel disease - engaging in conversation less, some long pauses noted - cont Depakote and Seroquel  3. Primary hypertension - BUN/creat 20/0.59 10/2020 - controlled - cont losartan  4. Diabetes mellitus type 2 in nonobese (HCC) - Blood sugars < 150 - a1c 5.3 10/2020 - cont   5. Mixed hyperlipidemia - LDL 50 04/2020 - cont Lipitor  6. Slow transit constipation - abdomen soft - cont colace daily and miralax prn  7. Acquired hypothyroidism - TSH 3.5 11/2019 - cont levothyroxine  8. Depression with anxiety - no recent panic attacks - cont Zoloft    Family/ staff Communication: plan discussed with patient and nurse  Labs/tests ordered:  none

## 2020-12-11 ENCOUNTER — Non-Acute Institutional Stay (SKILLED_NURSING_FACILITY): Payer: Medicare Other | Admitting: Internal Medicine

## 2020-12-11 ENCOUNTER — Encounter: Payer: Self-pay | Admitting: Internal Medicine

## 2020-12-11 DIAGNOSIS — F03911 Unspecified dementia, unspecified severity, with agitation: Secondary | ICD-10-CM

## 2020-12-11 DIAGNOSIS — I10 Essential (primary) hypertension: Secondary | ICD-10-CM | POA: Diagnosis not present

## 2020-12-11 DIAGNOSIS — F01518 Vascular dementia, unspecified severity, with other behavioral disturbance: Secondary | ICD-10-CM | POA: Diagnosis not present

## 2020-12-11 DIAGNOSIS — F418 Other specified anxiety disorders: Secondary | ICD-10-CM

## 2020-12-11 DIAGNOSIS — E119 Type 2 diabetes mellitus without complications: Secondary | ICD-10-CM

## 2020-12-11 DIAGNOSIS — E782 Mixed hyperlipidemia: Secondary | ICD-10-CM

## 2020-12-11 DIAGNOSIS — E039 Hypothyroidism, unspecified: Secondary | ICD-10-CM

## 2020-12-11 NOTE — Progress Notes (Signed)
Location:   [facility] Room Number: 39 Place of Service:  SNF [phone]) Provider:  Veleta Miners MD  [name], MD  Patient Care Team: [name], MD as PCP - General (Internal Medicine)  Extended Emergency Contact Information Primary Emergency Contact: Harris,Margaret Address: [address]          [place], [place] [address] [name] of [place] Phone: [phone] Mobile Phone: [phone] Relation: Daughter  Code Status:  DNR Goals of care: Advanced Directive information Advanced Directives 12/11/2020  Does Patient Have a Medical Advance Directive? Yes  Type of Paramedic of [place];Living will;Out of facility DNR (pink MOST or yellow form)  Does patient want to make changes to medical advance directive? No - Patient declined  Copy of [place] in Chart? Yes - validated most recent copy scanned in chart (See row information)  Would patient like information on creating a medical advance directive? -  Pre-existing out of facility DNR order (yellow form or pink MOST form) Yellow form placed in chart (order not valid for inpatient use)     Chief Complaint  Patient presents with   Medical Management of Chronic Issues   Quality Metric Gaps    Eye exam, Flu shot, Hemoglobin A1C    HPI:  Pt is a 87 y.o. female seen today for medical management of chronic diseases.    Patient has a history of type 2 diabetes, hyperlipidemia, depression, dementia, hypothyroidism, osteopenia, stress and urge urinary incontinence, Tachycardia  [name] SNF Resident Is staying stable Continues to have Behavior issues especially in the evening Nurses usually take permission from the daughter before giving her PRN ativan Weight is stable She did have episode of Mental status change with Booster but is now seems back to her baseline Wheelchair Dependent Still Elopement risk.    Past Medical History:  Diagnosis Date    Hearing loss    History of fracture of clavicle    Hypothyroidism    Insomnia    Tinnitus    Past Surgical History:  Procedure Laterality Date   CHOLECYSTECTOMY  2007   Dr. Verdene Lennert   CLAVICLE EXCISION  1986   INTRAMEDULLARY (IM) NAIL INTERTROCHANTERIC Left 04/09/2020   Procedure: LEFT INTERTROCHANTERIC INTRAMEDULLARY (IM) NAIL;  Surgeon: Leandrew Koyanagi, MD;  Location: [facility];  Service: Orthopedics;  Laterality: Left;   MOHS SURGERY     past 10 years chest & legs   OPEN REDUCTION INTERNAL FIXATION (ORIF) DISTAL RADIAL FRACTURE Left 04/09/2020   Procedure: CLOSED REDUCTION LEFT DISTAL RADIUS FRACTURE;  Surgeon: Leandrew Koyanagi, MD;  Location: [facility];  Service: Orthopedics;  Laterality: Left;    Allergies  Allergen Reactions   Metformin And Related Diarrhea    Allergies as of 12/11/2020       Reactions   Metformin And Related Diarrhea        Medication List        Accurate as of December 11, 2020 11:17 AM. If you have any questions, ask your nurse or doctor.          acetaminophen 325 MG tablet Commonly known as: TYLENOL Take 650 mg by mouth 3 (three) times daily as needed.   atorvastatin 10 MG tablet Commonly known as: LIPITOR Take 10 mg by mouth daily.   divalproex 125 MG DR tablet Commonly known as: DEPAKOTE Take 125 mg by mouth 2 (two) times daily.   docusate sodium 100 MG capsule Commonly known  as: COLACE Take 100 mg by mouth daily.   glimepiride 1 MG tablet Commonly known as: AMARYL Take 0.5 mg by mouth daily with breakfast.   levothyroxine 75 MCG tablet Commonly known as: SYNTHROID TAKE 1 TABLET (75 MCG TOTAL) BY MOUTH DAILY BEFORE BREAKFAST.   LORazepam 0.5 MG tablet Commonly known as: ATIVAN Take 0.5 mg by mouth daily.   LORazepam 0.5 MG tablet Commonly known as: ATIVAN Take 0.5 mg by mouth daily as needed for anxiety.   losartan 25 MG tablet Commonly known as: COZAAR Take 12.5 mg by mouth daily.   metoprolol tartrate 25 MG tablet Commonly  known as: LOPRESSOR Take 12.5 mg by mouth 2 (two) times daily.   polyethylene glycol 17 g packet Commonly known as: MIRALAX / GLYCOLAX Take 17 g by mouth daily as needed for moderate constipation.   QUEtiapine 25 MG tablet Commonly known as: SEROQUEL Take 25 mg by mouth at bedtime.   sertraline 50 MG tablet Commonly known as: ZOLOFT Take 50 mg by mouth daily.   Vitamin D3 50 MCG (2000 UT) Tabs Take 1 tablet by mouth daily.   zinc oxide 20 % ointment Apply 1 application topically as needed for irritation.        Review of Systems  Unable to perform ROS: Dementia   Immunization History  Administered Date(s) Administered   DTaP 07/20/2013   Influenza, High Dose Seasonal PF 12/07/2016   Influenza,inj,Quad PF,6+ Mos 12/01/2017   Influenza-Unspecified 11/20/2014, 12/12/2015, 11/20/2019   Moderna SARS-COV2 Booster Vaccination 08/06/2020   Moderna Sars-Covid-2 Vaccination 03/05/2019, 04/02/2019   PFIZER(Purple Top)SARS-COV-2 Vaccination 04/01/2020, 11/19/2020   PPD Test 08/06/2014   Pneumococcal Conjugate-13 11/07/2013   Pneumococcal Polysaccharide-23 03/22/2017   Zoster Recombinat (Shingrix) 06/07/2017, 08/26/2017   Zoster, Live 03/01/2006   Pertinent  Health Maintenance Due  Topic Date Due   OPHTHALMOLOGY EXAM  Never done   INFLUENZA VACCINE  09/29/2020   HEMOGLOBIN A1C  10/05/2020   FOOT EXAM  07/23/2021   DEXA SCAN  Completed   Fall Risk  09/12/2020 12/19/2019 10/17/2019 08/22/2019 07/11/2019  Falls in the past year? 1 0 0 0 0  Number falls in past yr: 1 0 0 0 0  Injury with Fall? 1 - - 0 -  Risk for fall due to : History of fall(s);Impaired balance/gait;Impaired mobility;Mental status change - - - -  Follow up Falls evaluation completed;Education provided;Falls prevention discussed - - - -   Functional Status Survey:    Vitals:   12/11/20 1112  BP: (!) 145/78  Pulse: 87  Resp: 16  Temp: 97.7 F (36.5 C)  SpO2: 96%  Weight: 133 lb 8 oz (60.6 kg)  Height:  5' (1.524 m)   Body mass index is 26.07 kg/m. Physical Exam Vitals reviewed.  Constitutional:      Appearance: Normal appearance.  HENT:     Head: Normocephalic.     Nose: Nose normal.     Mouth/Throat:     Mouth: Mucous membranes are moist.     Pharynx: Oropharynx is clear.  Eyes:     Pupils: Pupils are equal, round, and reactive to light.  Cardiovascular:     Rate and Rhythm: Normal rate and regular rhythm.     Pulses: Normal pulses.     Heart sounds: Normal heart sounds.  Pulmonary:     Effort: Pulmonary effort is normal.     Breath sounds: Normal breath sounds.  Abdominal:     General: Abdomen is flat. Bowel sounds are  normal.     Palpations: Abdomen is soft.  Musculoskeletal:        General: No swelling.     Cervical back: Neck supple.  Skin:    General: Skin is warm.  Neurological:     General: No focal deficit present.     Mental Status: She is alert.     Comments: Pleasantly Confused Has mild Aphasia Wheelchair Dependent Does follows commands   Psychiatric:        Mood and Affect: Mood normal.        Thought Content: Thought content normal.    Labs reviewed: Recent Labs    04/10/20 0155 04/11/20 0136 04/14/20 0354 04/17/20 0000 05/01/20 0000 07/17/20 0000 11/20/20 0000  NA 138 141 142   < > 141 143 141  K 3.9 3.5 3.4*   < > 4.2 4.2 3.7  CL 104 108 103   < > 106 107 105  CO2 23 23 25    < > 26* 27* 26*  GLUCOSE 150* 106* 101*  --   --   --   --   BUN 12 16 17    < > 23* 23* 20  CREATININE 0.62 0.57 0.61   < > 0.5 0.6 0.7  CALCIUM 8.5* 8.0* 8.3*   < > 9.0 8.7 8.4*   < > = values in this interval not displayed.   Recent Labs    12/13/19 0000 01/10/20 0000 05/01/20 0000 07/17/20 0000 11/20/20 0000  AST 15   < > 13 12* 13  ALT 11   < > 11 7 8   ALKPHOS  --    < > 127* 79 65  BILITOT 0.4  --   --   --   --   PROT 6.5  --   --   --   --   ALBUMIN  --    < > 3.5 3.3* 3.2*   < > = values in this interval not displayed.   Recent Labs     04/11/20 0136 04/12/20 0200 04/12/20 1841 04/14/20 0354 05/01/20 0000 06/09/20 0000 07/17/20 0000 11/20/20 0000  WBC 10.2 9.0  --  11.0* 7.9 9.8 9.0 7.1  NEUTROABS  --   --   --   --  4,471.00 5,772.00 5,517.00 5,730.00  HGB 8.1* 7.5*   < > 9.4* 11.2* 12.1 11.3* 10.7*  HCT 26.2* 23.4*   < > 30.9* 35* 38 36 34*  MCV 84.8 83.9  --  85.8  --   --   --   --   PLT 276 270  --  317 322 299 258  --    < > = values in this interval not displayed.   Lab Results  Component Value Date   TSH 3.509 12/29/2019   Lab Results  Component Value Date   HGBA1C 5.5 04/07/2020   Lab Results  Component Value Date   CHOL 96 11/20/2020   HDL 33 (A) 11/20/2020   LDLCALC 45 11/20/2020   TRIG 94 11/20/2020   CHOLHDL 3.0 12/13/2019    Significant Diagnostic Results in last 30 days:  No results found.  Assessment/Plan  Diabetes mellitus type 2 in nonobese (HCC) Discontinue  Amaryl Her CBgs are in 100s and A1C 5.5 Will change Accu check to Q weekly  Agitation due to dementia Continues to be issue but responds to Ativan PRN and also on Seroquel Continue on Depakote  Vascular dementia with behavior disturbance Supportive care  Primary hypertension with Tachycardia On  Low dose of Lopressor and Cozaar  Mixed hyperlipidemia Continue low dose of statin  Acquired hypothyroidism TSH normal in 10/21 Depression with anxiety On Zoloft  Family/ staff Communication:   Labs/tests ordered:

## 2020-12-26 ENCOUNTER — Encounter: Payer: Self-pay | Admitting: Orthopedic Surgery

## 2020-12-26 ENCOUNTER — Non-Acute Institutional Stay (SKILLED_NURSING_FACILITY): Payer: Medicare Other | Admitting: Orthopedic Surgery

## 2020-12-26 DIAGNOSIS — R112 Nausea with vomiting, unspecified: Secondary | ICD-10-CM | POA: Diagnosis not present

## 2020-12-26 MED ORDER — ONDANSETRON HCL 4 MG PO TABS: 4.0000 mg | ORAL_TABLET | Freq: Two times a day (BID) | ORAL | 0 refills | Status: DC | PRN

## 2020-12-26 NOTE — Progress Notes (Signed)
Location:  [facility] Room Number: 54 Place of Service:  SNF [phone]) Provider:  Windell Moulding, AGNP-C  [name], MD  Patient Care Team: [name], MD as PCP - General (Internal Medicine)  Extended Emergency Contact Information Primary Emergency Contact: Harris,Margaret Address: [address]          [place], [place] [address] [name] of [place] Phone: [phone] Mobile Phone: [phone] Relation: Daughter  Code Status:  DNR Goals of care: Advanced Directive information Advanced Directives 12/11/2020  Does Patient Have a Medical Advance Directive? Yes  Type of Paramedic of [place];Living will;Out of facility DNR (pink MOST or yellow form)  Does patient want to make changes to medical advance directive? No - Patient declined  Copy of [place] in Chart? Yes - validated most recent copy scanned in chart (See row information)  Would patient like information on creating a medical advance directive? -  Pre-existing out of facility DNR order (yellow form or pink MOST form) Yellow form placed in chart (order not valid for inpatient use)     Chief Complaint  Patient presents with   Acute Visit    Nausea/vomiting    HPI:  Pt is a 87 y.o. female seen today for acute visit due to nausea and vomiting.   10/27 nursing staff reports one episode of nausea and vomiting after eating dinner. No other symptoms. This morning she has consumed > 75% of her breakfast. She denies nausea, vomiting and abdominal pain. LBM 12/25/2020. She has a history of intermittent nausea and vomiting within the past year. Treatment options discussed with daughter, would like to have Zofran prn on her medication list.   No recent falls, injuries or behavioral outbursts.    Past Medical History:  Diagnosis Date   Hearing loss    History of fracture of clavicle    Hypothyroidism    Insomnia    Tinnitus    Past Surgical  History:  Procedure Laterality Date   CHOLECYSTECTOMY  2007   Dr. Verdene Lennert   CLAVICLE EXCISION  1986   INTRAMEDULLARY (IM) NAIL INTERTROCHANTERIC Left 04/09/2020   Procedure: LEFT INTERTROCHANTERIC INTRAMEDULLARY (IM) NAIL;  Surgeon: Leandrew Koyanagi, MD;  Location: [facility];  Service: Orthopedics;  Laterality: Left;   MOHS SURGERY     past 10 years chest & legs   OPEN REDUCTION INTERNAL FIXATION (ORIF) DISTAL RADIAL FRACTURE Left 04/09/2020   Procedure: CLOSED REDUCTION LEFT DISTAL RADIUS FRACTURE;  Surgeon: Leandrew Koyanagi, MD;  Location: [facility];  Service: Orthopedics;  Laterality: Left;    Allergies  Allergen Reactions   Metformin And Related Diarrhea    Outpatient Encounter Medications as of 12/26/2020  Medication Sig   acetaminophen (TYLENOL) 325 MG tablet Take 650 mg by mouth 3 (three) times daily as needed.   atorvastatin (LIPITOR) 10 MG tablet Take 10 mg by mouth daily.   Cholecalciferol (VITAMIN D3) 2000 units TABS Take 1 tablet by mouth daily.    divalproex (DEPAKOTE) 125 MG DR tablet Take 125 mg by mouth 2 (two) times daily.   docusate sodium (COLACE) 100 MG capsule Take 100 mg by mouth daily.   levothyroxine (SYNTHROID) 75 MCG tablet TAKE 1 TABLET (75 MCG TOTAL) BY MOUTH DAILY BEFORE BREAKFAST.   LORazepam (ATIVAN) 0.5 MG tablet Take 0.5 mg by mouth daily.   losartan (COZAAR) 25 MG tablet Take 12.5 mg by mouth daily.   metoprolol tartrate (LOPRESSOR) 25 MG tablet Take  12.5 mg by mouth 2 (two) times daily.   polyethylene glycol (MIRALAX / GLYCOLAX) 17 g packet Take 17 g by mouth daily as needed for moderate constipation.   QUEtiapine (SEROQUEL) 25 MG tablet Take 25 mg by mouth at bedtime.   sertraline (ZOLOFT) 50 MG tablet Take 50 mg by mouth daily.   zinc oxide 20 % ointment Apply 1 application topically as needed for irritation.   No facility-administered encounter medications on file as of 12/26/2020.    Review of Systems  Unable to perform ROS: Dementia   Immunization History   Administered Date(s) Administered   DTaP 07/20/2013   Influenza, High Dose Seasonal PF 12/07/2016   Influenza,inj,Quad PF,6+ Mos 12/01/2017   Influenza-Unspecified 11/20/2014, 12/12/2015, 11/20/2019   Moderna SARS-COV2 Booster Vaccination 08/06/2020   Moderna Sars-Covid-2 Vaccination 03/05/2019, 04/02/2019   PFIZER(Purple Top)SARS-COV-2 Vaccination 04/01/2020, 11/19/2020   PPD Test 08/06/2014   Pneumococcal Conjugate-13 11/07/2013   Pneumococcal Polysaccharide-23 03/22/2017   Zoster Recombinat (Shingrix) 06/07/2017, 08/26/2017   Zoster, Live 03/01/2006   Pertinent  Health Maintenance Due  Topic Date Due   OPHTHALMOLOGY EXAM  Never done   INFLUENZA VACCINE  09/29/2020   HEMOGLOBIN A1C  10/05/2020   FOOT EXAM  07/23/2021   DEXA SCAN  Completed   Fall Risk 04/12/2020 04/13/2020 04/13/2020 04/14/2020 09/12/2020  Falls in the past year? - - - - 1  Was there an injury with Fall? - - - - 1  Fall Risk Category Calculator - - - - 3  Fall Risk Category - - - - High  Patient Fall Risk Level High fall risk High fall risk High fall risk High fall risk -  Patient at Risk for Falls Due to - - - - History of fall(s);Impaired balance/gait;Impaired mobility;Mental status change  Fall risk Follow up - - - - Falls evaluation completed;Education provided;Falls prevention discussed   Functional Status Survey:    Vitals:   12/26/20 1557  BP: (!) 159/68  Pulse: 86  Resp: 20  Temp: 97.9 F (36.6 C)  SpO2: 97%  Weight: 133 lb 8 oz (60.6 kg)  Height: 5' (1.524 m)   Body mass index is 26.07 kg/m. Physical Exam Vitals reviewed.  Constitutional:      General: She is not in acute distress. HENT:     Head: Normocephalic.  Eyes:     General:        Right eye: No discharge.        Left eye: No discharge.  Cardiovascular:     Rate and Rhythm: Normal rate and regular rhythm.     Pulses: Normal pulses.     Heart sounds: Normal heart sounds. No murmur heard. Pulmonary:     Effort: Pulmonary  effort is normal. No respiratory distress.     Breath sounds: Normal breath sounds. No wheezing.  Abdominal:     General: Abdomen is flat. Bowel sounds are normal. There is no distension.     Palpations: Abdomen is soft.     Tenderness: There is no abdominal tenderness.  Musculoskeletal:     Right lower leg: No edema.     Left lower leg: No edema.  Skin:    General: Skin is warm and dry.     Capillary Refill: Capillary refill takes less than 2 seconds.  Neurological:     General: No focal deficit present.     Mental Status: She is alert. Mental status is at baseline.     Motor: Weakness present.  Gait: Gait abnormal.     Comments: wheelchair  Psychiatric:        Mood and Affect: Mood normal.        Behavior: Behavior normal.        Cognition and Memory: Memory is impaired.    Labs reviewed: Recent Labs    04/10/20 0155 04/11/20 0136 04/14/20 0354 04/17/20 0000 05/01/20 0000 07/17/20 0000 11/20/20 0000  NA 138 141 142   < > 141 143 141  K 3.9 3.5 3.4*   < > 4.2 4.2 3.7  CL 104 108 103   < > 106 107 105  CO2 23 23 25    < > 26* 27* 26*  GLUCOSE 150* 106* 101*  --   --   --   --   BUN 12 16 17    < > 23* 23* 20  CREATININE 0.62 0.57 0.61   < > 0.5 0.6 0.7  CALCIUM 8.5* 8.0* 8.3*   < > 9.0 8.7 8.4*   < > = values in this interval not displayed.   Recent Labs    05/01/20 0000 07/17/20 0000 11/20/20 0000  AST 13 12* 13  ALT 11 7 8   ALKPHOS 127* 79 65  ALBUMIN 3.5 3.3* 3.2*   Recent Labs    04/11/20 0136 04/12/20 0200 04/12/20 1841 04/14/20 0354 05/01/20 0000 06/09/20 0000 07/17/20 0000 11/20/20 0000  WBC 10.2 9.0  --  11.0* 7.9 9.8 9.0 7.1  NEUTROABS  --   --   --   --  4,471.00 5,772.00 5,517.00 5,730.00  HGB 8.1* 7.5*   < > 9.4* 11.2* 12.1 11.3* 10.7*  HCT 26.2* 23.4*   < > 30.9* 35* 38 36 34*  MCV 84.8 83.9  --  85.8  --   --   --   --   PLT 276 270  --  317 322 299 258  --    < > = values in this interval not displayed.   Lab Results  Component  Value Date   TSH 3.509 12/29/2019   Lab Results  Component Value Date   HGBA1C 5.5 04/07/2020   Lab Results  Component Value Date   CHOL 96 11/20/2020   HDL 33 (A) 11/20/2020   LDLCALC 45 11/20/2020   TRIG 94 11/20/2020   CHOLHDL 3.0 12/13/2019    Significant Diagnostic Results in last 30 days:  No results found.  Assessment/Plan 1. Nausea and vomiting, unspecified vomiting type - single episode of N/V after dinner 10/27 - exam unremarkable - encourage fluids - ondansetron (ZOFRAN) 4 MG tablet; Take 1 tablet (4 mg total) by mouth 2 (two) times daily as needed for nausea or vomiting.  Dispense: 20 tablet; Refill: 0    Family/ staff Communication: plan discussed with patient, daughter and nurse  Labs/tests ordered:  none

## 2021-01-06 DIAGNOSIS — M542 Cervicalgia: Secondary | ICD-10-CM | POA: Diagnosis not present

## 2021-01-07 ENCOUNTER — Non-Acute Institutional Stay (SKILLED_NURSING_FACILITY): Payer: Medicare Other | Admitting: Orthopedic Surgery

## 2021-01-07 ENCOUNTER — Encounter: Payer: Self-pay | Admitting: Orthopedic Surgery

## 2021-01-07 ENCOUNTER — Other Ambulatory Visit: Payer: Self-pay | Admitting: Orthopedic Surgery

## 2021-01-07 DIAGNOSIS — M542 Cervicalgia: Secondary | ICD-10-CM | POA: Diagnosis not present

## 2021-01-07 DIAGNOSIS — E119 Type 2 diabetes mellitus without complications: Secondary | ICD-10-CM

## 2021-01-07 DIAGNOSIS — F01518 Vascular dementia, unspecified severity, with other behavioral disturbance: Secondary | ICD-10-CM | POA: Diagnosis not present

## 2021-01-07 DIAGNOSIS — E782 Mixed hyperlipidemia: Secondary | ICD-10-CM

## 2021-01-07 DIAGNOSIS — K5901 Slow transit constipation: Secondary | ICD-10-CM

## 2021-01-07 DIAGNOSIS — E039 Hypothyroidism, unspecified: Secondary | ICD-10-CM

## 2021-01-07 DIAGNOSIS — F418 Other specified anxiety disorders: Secondary | ICD-10-CM

## 2021-01-07 DIAGNOSIS — I10 Essential (primary) hypertension: Secondary | ICD-10-CM

## 2021-01-07 MED ORDER — OXYCODONE HCL 5 MG PO TABS: 2.5000 mg | ORAL_TABLET | Freq: Four times a day (QID) | ORAL | 0 refills | Status: DC | PRN

## 2021-01-07 MED ORDER — METHOCARBAMOL 500 MG PO TABS: 250.0000 mg | ORAL_TABLET | Freq: Two times a day (BID) | ORAL | 0 refills | Status: DC | PRN

## 2021-01-07 NOTE — Addendum Note (Signed)
Addended byWindell Moulding E on: 01/07/2021 10:44 AM   Modules accepted: Orders

## 2021-01-07 NOTE — Progress Notes (Signed)
Location:   [facility] Room Number: 61 A Place of Service:  SNF (31) Provider:  Windell Moulding, NP  [name], MD  Patient Care Team: [name], MD as PCP - General (Internal Medicine)  Extended Emergency Contact Information Primary Emergency Contact: Harris,Margaret Address: [address]          [place], [place] [address] [name] of [place] Phone: [phone] Mobile Phone: [phone] Relation: Daughter  Code Status:  DNR Goals of care: Advanced Directive information Advanced Directives 01/07/2021  Does Patient Have a Medical Advance Directive? Yes  Type of Paramedic of [place];Living will;Out of facility DNR (pink MOST or yellow form)  Does patient want to make changes to medical advance directive? No - Patient declined  Copy of [place] in Chart? Yes - validated most recent copy scanned in chart (See row information)  Would patient like information on creating a medical advance directive? -  Pre-existing out of facility DNR order (yellow form or pink MOST form) Yellow form placed in chart (order not valid for inpatient use)     Chief Complaint  Patient presents with   Medical Management of Chronic Issues    Routine Visit   Quality Metric Gaps    Eye exam, FLU    HPI:  Pt is a 87 y.o. female seen today for medical management of chronic diseases.    She currently resides on the skilled nursing unit at [facility]. Past medical includes: HTN, constipation, T2DM, hypothyroidism, OA, urinary retention, vascular dementia, depression and anxiety.     Neck pain- 11/08 she fell in front of the nurses station trying to get up from her wheelchair, she landed on her back and also hit her head, vitals stable after incident, she was not sent to ED for further evaluation, this morning she reports her neck feels stiff with limited movement, cervical spine xray 01/06/2021 negative for  fracture Dementia- BIMS 5 09/2020, CT head 04/2020 age related atrophy and chronic small vessel ischemia, remains on Depakote and Seroquel for behaviors, AST/ALT 8/13 11/20/2020 HTN- BUN/creat 20/0.7 11/20/2020, remains on metoprolol and losartan T2DM- a1c 5.5 04/07/2020, blood sugars averaging 100-120, not on medications at this time HLD- LDL 45 11/20/2020, remains on Lipitor Constipation- LBM 11/08, colace daily, miralax prn Hypothyroidism- TSH 3.509 11/2019, levothyroxine  Depression/anxiety- no recent panic attacks for increased depression, Zoloft daily, ativan prn  Ambulates with wheelchair.   Recent blood pressures:  11/09- 145/78, 157/87  11/08- 108/68, 121/78  Recent weights:  11/01- 132.9 lbs  10/04- 133.5 lbs  09/02- 130.5 lbs      Past Medical History:  Diagnosis Date   Hearing loss    History of fracture of clavicle    Hypothyroidism    Insomnia    Tinnitus    Past Surgical History:  Procedure Laterality Date   CHOLECYSTECTOMY  2007   Dr. Verdene Lennert   CLAVICLE EXCISION  1986   INTRAMEDULLARY (IM) NAIL INTERTROCHANTERIC Left 04/09/2020   Procedure: LEFT INTERTROCHANTERIC INTRAMEDULLARY (IM) NAIL;  Surgeon: Leandrew Koyanagi, MD;  Location: [facility];  Service: Orthopedics;  Laterality: Left;   MOHS SURGERY     past 10 years chest & legs   OPEN REDUCTION INTERNAL FIXATION (ORIF) DISTAL RADIAL FRACTURE Left 04/09/2020   Procedure: CLOSED REDUCTION LEFT DISTAL RADIUS FRACTURE;  Surgeon: Leandrew Koyanagi, MD;  Location: [facility];  Service: Orthopedics;  Laterality: Left;    Allergies  Allergen Reactions  Metformin And Related Diarrhea    Allergies as of 01/07/2021       Reactions   Metformin And Related Diarrhea        Medication List        Accurate as of January 07, 2021 10:58 AM. If you have any questions, ask your nurse or doctor.          acetaminophen 325 MG tablet Commonly known as: TYLENOL Take 650 mg by mouth 3 (three) times daily as needed.    atorvastatin 10 MG tablet Commonly known as: LIPITOR Take 10 mg by mouth daily.   divalproex 125 MG DR tablet Commonly known as: DEPAKOTE Take 125 mg by mouth 2 (two) times daily.   docusate sodium 100 MG capsule Commonly known as: COLACE Take 100 mg by mouth daily.   levothyroxine 75 MCG tablet Commonly known as: SYNTHROID TAKE 1 TABLET (75 MCG TOTAL) BY MOUTH DAILY BEFORE BREAKFAST.   LORazepam 0.5 MG tablet Commonly known as: ATIVAN Take 0.5 mg by mouth daily.   losartan 25 MG tablet Commonly known as: COZAAR Take 12.5 mg by mouth daily.   methocarbamol 500 MG tablet Commonly known as: Robaxin Take 0.5 tablets (250 mg total) by mouth 2 (two) times daily as needed for muscle spasms. Started by: Yvonna Alanis, NP   metoprolol tartrate 25 MG tablet Commonly known as: LOPRESSOR Take 12.5 mg by mouth 2 (two) times daily.   ondansetron 4 MG tablet Commonly known as: Zofran Take 1 tablet (4 mg total) by mouth 2 (two) times daily as needed for nausea or vomiting.   oxyCODONE 5 MG immediate release tablet Commonly known as: Roxicodone Take 0.5 tablets (2.5 mg total) by mouth every 6 (six) hours as needed for up to 14 days for severe pain. Started by: Yvonna Alanis, NP   polyethylene glycol 17 g packet Commonly known as: MIRALAX / GLYCOLAX Take 17 g by mouth daily as needed for moderate constipation.   QUEtiapine 25 MG tablet Commonly known as: SEROQUEL Take 25 mg by mouth at bedtime.   sertraline 50 MG tablet Commonly known as: ZOLOFT Take 50 mg by mouth daily.   Vitamin D3 50 MCG (2000 UT) Tabs Take 1 tablet by mouth daily.   zinc oxide 20 % ointment Apply 1 application topically as needed for irritation.        Review of Systems  Unable to perform ROS: Dementia   Immunization History  Administered Date(s) Administered   DTaP 07/20/2013   Influenza, High Dose Seasonal PF 12/07/2016   Influenza,inj,Quad PF,6+ Mos 12/01/2017   Influenza-Unspecified  11/20/2014, 12/12/2015, 11/20/2019   Moderna SARS-COV2 Booster Vaccination 08/06/2020   Moderna Sars-Covid-2 Vaccination 03/05/2019, 04/02/2019   PFIZER(Purple Top)SARS-COV-2 Vaccination 04/01/2020, 11/19/2020   PPD Test 08/06/2014   Pneumococcal Conjugate-13 11/07/2013   Pneumococcal Polysaccharide-23 03/22/2017   Zoster Recombinat (Shingrix) 06/07/2017, 08/26/2017   Zoster, Live 03/01/2006   Pertinent  Health Maintenance Due  Topic Date Due   OPHTHALMOLOGY EXAM  Never done   INFLUENZA VACCINE  09/29/2020   HEMOGLOBIN A1C  05/20/2021   FOOT EXAM  07/23/2021   DEXA SCAN  Completed   Fall Risk 04/12/2020 04/13/2020 04/13/2020 04/14/2020 09/12/2020  Falls in the past year? - - - - 1  Was there an injury with Fall? - - - - 1  Fall Risk Category Calculator - - - - 3  Fall Risk Category - - - - High  Patient Fall Risk Level High fall risk High  fall risk High fall risk High fall risk -  Patient at Risk for Falls Due to - - - - History of fall(s);Impaired balance/gait;Impaired mobility;Mental status change  Fall risk Follow up - - - - Falls evaluation completed;Education provided;Falls prevention discussed   Functional Status Survey:    Vitals:   01/07/21 1035  BP: (!) 157/87  Pulse: 61  Resp: 16  Temp: (!) 97.3 F (36.3 C)  SpO2: 93%  Weight: 132 lb 14.4 oz (60.3 kg)  Height: 5' (1.524 m)   Body mass index is 25.96 kg/m. Physical Exam Vitals reviewed.  Constitutional:      General: She is not in acute distress. HENT:     Head: Normocephalic and atraumatic. No raccoon eyes, Battle's sign, abrasion, contusion, masses or laceration.     Right Ear: There is no impacted cerumen.     Left Ear: There is no impacted cerumen.     Nose: Nose normal.     Mouth/Throat:     Mouth: Mucous membranes are moist.  Eyes:     General:        Right eye: No discharge.        Left eye: No discharge.  Neck:     Vascular: No carotid bruit.     Comments: Increased pain with flexion/extension,  abduction/adduction Cardiovascular:     Rate and Rhythm: Normal rate and regular rhythm.     Pulses: Normal pulses.     Heart sounds: Normal heart sounds. No murmur heard. Pulmonary:     Effort: Pulmonary effort is normal. No respiratory distress.     Breath sounds: Normal breath sounds. No wheezing.  Abdominal:     General: Bowel sounds are normal. There is no distension.     Palpations: Abdomen is soft.     Tenderness: There is no abdominal tenderness.  Musculoskeletal:     Cervical back: Rigidity and tenderness present. No edema, erythema or crepitus. Pain with movement present. Decreased range of motion.     Thoracic back: Normal.     Lumbar back: Normal.     Right lower leg: No edema.     Left lower leg: No edema.  Lymphadenopathy:     Cervical: No cervical adenopathy.  Skin:    General: Skin is warm and dry.     Capillary Refill: Capillary refill takes less than 2 seconds.  Neurological:     General: No focal deficit present.     Mental Status: She is alert. Mental status is at baseline.     Motor: Weakness present.     Gait: Gait abnormal.  Psychiatric:        Mood and Affect: Mood normal. Affect is flat.        Behavior: Behavior normal.        Cognition and Memory: Memory is impaired.    Labs reviewed: Recent Labs    04/10/20 0155 04/11/20 0136 04/14/20 0354 04/17/20 0000 05/01/20 0000 07/17/20 0000 11/20/20 0000  NA 138 141 142   < > 141 143 141  K 3.9 3.5 3.4*   < > 4.2 4.2 3.7  CL 104 108 103   < > 106 107 105  CO2 23 23 25    < > 26* 27* 26*  GLUCOSE 150* 106* 101*  --   --   --   --   BUN 12 16 17    < > 23* 23* 20  CREATININE 0.62 0.57 0.61   < > 0.5 0.6 0.7  CALCIUM 8.5* 8.0* 8.3*   < > 9.0 8.7 8.4*   < > = values in this interval not displayed.   Recent Labs    05/01/20 0000 07/17/20 0000 11/20/20 0000  AST 13 12* 13  ALT 11 7 8   ALKPHOS 127* 79 65  ALBUMIN 3.5 3.3* 3.2*   Recent Labs    04/11/20 0136 04/12/20 0200 04/12/20 1841  04/14/20 0354 05/01/20 0000 06/09/20 0000 07/17/20 0000 11/20/20 0000  WBC 10.2 9.0  --  11.0* 7.9 9.8 9.0 7.1  NEUTROABS  --   --   --   --  4,471.00 5,772.00 5,517.00 5,730.00  HGB 8.1* 7.5*   < > 9.4* 11.2* 12.1 11.3* 10.7*  HCT 26.2* 23.4*   < > 30.9* 35* 38 36 34*  MCV 84.8 83.9  --  85.8  --   --   --   --   PLT 276 270  --  317 322 299 258  --    < > = values in this interval not displayed.   Lab Results  Component Value Date   TSH 3.509 12/29/2019   Lab Results  Component Value Date   HGBA1C 5.5 04/07/2020   Lab Results  Component Value Date   CHOL 96 11/20/2020   HDL 33 (A) 11/20/2020   LDLCALC 45 11/20/2020   TRIG 94 11/20/2020   CHOLHDL 3.0 12/13/2019    Significant Diagnostic Results in last 30 days:  No results found.  Assessment/Plan 1. Neck pain - s/p fall 01/06/2021 - cervical spine xray unremarkable 01/06/2021 - increased neck pain with movement - start oxycodone 2.5 mg po Q6hrs prn x 14 days - start robaxin 250 mg po BID prn x 14 days - PT/OT evaluation- 2nd session to be done with daughters presence- PT notified  2. Vascular dementia with behavior disturbance - no recent behavioral incidents - she will get restless at times and get up on her own - cont Depkaote and Seroquel for behaviors - cont skilled nursing care - memory care unit recommended- family is not interested at this time  3. Primary hypertension - controlled - cont metoprolol and losartan  4. Diabetes mellitus type 2 in nonobese (HCC) - A1c controlled - blood sugars 100-120's - A1c- 01/08/2021  5. Mixed hyperlipidemia - LDL 45 10/2020 - cont statin  6. Acquired hypothyroidism - TSH normal - cont levothyroxine  7. Slow transit constipation - abdomen soft, BS active - cont colace and miralax  8. Depression with anxiety - cont zoloft and ativan prn    Family/ staff Communication: plan discussed with daughter and nurse  Labs/tests ordered:  A1c

## 2021-01-08 DIAGNOSIS — E119 Type 2 diabetes mellitus without complications: Secondary | ICD-10-CM | POA: Diagnosis not present

## 2021-01-09 LAB — HEMOGLOBIN A1C: Hemoglobin A1C: 5.5

## 2021-01-14 ENCOUNTER — Encounter: Payer: Self-pay | Admitting: Orthopedic Surgery

## 2021-01-14 ENCOUNTER — Non-Acute Institutional Stay (SKILLED_NURSING_FACILITY): Payer: Medicare Other | Admitting: Orthopedic Surgery

## 2021-01-14 DIAGNOSIS — M25562 Pain in left knee: Secondary | ICD-10-CM | POA: Diagnosis not present

## 2021-01-14 DIAGNOSIS — M25552 Pain in left hip: Secondary | ICD-10-CM | POA: Diagnosis not present

## 2021-01-14 DIAGNOSIS — E039 Hypothyroidism, unspecified: Secondary | ICD-10-CM

## 2021-01-14 DIAGNOSIS — F01518 Vascular dementia, unspecified severity, with other behavioral disturbance: Secondary | ICD-10-CM | POA: Diagnosis not present

## 2021-01-14 DIAGNOSIS — F418 Other specified anxiety disorders: Secondary | ICD-10-CM

## 2021-01-14 DIAGNOSIS — M79605 Pain in left leg: Secondary | ICD-10-CM | POA: Diagnosis not present

## 2021-01-14 DIAGNOSIS — M79652 Pain in left thigh: Secondary | ICD-10-CM | POA: Diagnosis not present

## 2021-01-14 DIAGNOSIS — K5901 Slow transit constipation: Secondary | ICD-10-CM | POA: Diagnosis not present

## 2021-01-14 DIAGNOSIS — E782 Mixed hyperlipidemia: Secondary | ICD-10-CM

## 2021-01-14 DIAGNOSIS — E119 Type 2 diabetes mellitus without complications: Secondary | ICD-10-CM

## 2021-01-14 DIAGNOSIS — I10 Essential (primary) hypertension: Secondary | ICD-10-CM | POA: Diagnosis not present

## 2021-01-14 NOTE — Progress Notes (Signed)
Location:   [facility] Room Number: 59 A Place of Service:  SNF (31) Provider:  Windell Moulding, NP  [name], MD  Patient Care Team: [name], MD as PCP - General (Internal Medicine)  Extended Emergency Contact Information Primary Emergency Contact: Harris,Margaret Address: [address]          [place], [place] [address] [name] of [place] Phone: [phone] Mobile Phone: [phone] Relation: Daughter  Code Status:  DNR Goals of care: Advanced Directive information Advanced Directives 01/14/2021  Does Patient Have a Medical Advance Directive? Yes  Type of Paramedic of [place];Living will;Out of facility DNR (pink MOST or yellow form)  Does patient want to make changes to medical advance directive? No - Patient declined  Copy of [place] in Chart? Yes - validated most recent copy scanned in chart (See row information)  Would patient like information on creating a medical advance directive? -  Pre-existing out of facility DNR order (yellow form or pink MOST form) Yellow form placed in chart (order not valid for inpatient use)     Chief Complaint  Patient presents with   Acute Visit    Acute visit for Left leg pain.    HPI:  Pt is a 87 y.o. female seen today for an acute visit for left leg and hip pain.   Left leg pain- 11/08 she fell in front of the nurses station trying to get up from her wheelchair.  She landed on her back and also hit her head. Vitals stable after incident. She was not sent to ED for further evaluation. 11/09 she complained of neck and back pain, cervical spine xray 01/06/2021 negative for fracture. PT evaluation ordered. She was also started on oxycodone 2.5 mg Q6hrs prn and robaxin 250 mg bid prn for pain. In the past week she has been unable to work with therapy due to increased pain. This morning she reports increased pain to left leg. She will yell with movement, no  pain with palpation. Denies neck or back pain. Daughter questions if she is fearful of movement after falling and will automatically yell when she moves. Treatment options discussed. Daughter has given consent to xray left leg. History of left femur fracture 04/2020.   Dementia- BIMS 5 09/2020, CT head 04/2020 age related atrophy and chronic small vessel ischemia, remains on Depakote and Seroquel for behaviors, AST/ALT 8/13 11/20/2020 HTN- BUN/creat 20/0.7 11/20/2020, remains on metoprolol and losartan T2DM- a1c 5.5 01/09/2021, blood sugars averaging 100-120, not on medications at this time HLD- LDL 45 11/20/2020, remains on Lipitor Constipation- LBM 11/08, colace daily, miralax prn Hypothyroidism- TSH 3.509 11/2019, levothyroxine  Depression/anxiety- no recent panic attacks for increased depression, Zoloft daily, ativan prn    Past Medical History:  Diagnosis Date   Hearing loss    History of fracture of clavicle    Hypothyroidism    Insomnia    Tinnitus    Past Surgical History:  Procedure Laterality Date   CHOLECYSTECTOMY  2007   Dr. Verdene Lennert   CLAVICLE EXCISION  1986   INTRAMEDULLARY (IM) NAIL INTERTROCHANTERIC Left 04/09/2020   Procedure: LEFT INTERTROCHANTERIC INTRAMEDULLARY (IM) NAIL;  Surgeon: Leandrew Koyanagi, MD;  Location: [facility];  Service: Orthopedics;  Laterality: Left;   MOHS SURGERY     past 10 years chest & legs   OPEN REDUCTION INTERNAL FIXATION (ORIF) DISTAL RADIAL FRACTURE Left 04/09/2020   Procedure: CLOSED REDUCTION LEFT DISTAL RADIUS FRACTURE;  Surgeon: Leandrew Koyanagi, MD;  Location: [facility];  Service: Orthopedics;  Laterality: Left;    Allergies  Allergen Reactions   Metformin And Related Diarrhea    Allergies as of 01/14/2021       Reactions   Metformin And Related Diarrhea        Medication List        Accurate as of January 14, 2021 10:07 AM. If you have any questions, ask your nurse or doctor.          acetaminophen 325 MG tablet Commonly known  as: TYLENOL Take 650 mg by mouth 3 (three) times daily as needed.   atorvastatin 10 MG tablet Commonly known as: LIPITOR Take 10 mg by mouth daily.   divalproex 125 MG DR tablet Commonly known as: DEPAKOTE Take 125 mg by mouth 2 (two) times daily.   docusate sodium 100 MG capsule Commonly known as: COLACE Take 100 mg by mouth daily.   levothyroxine 75 MCG tablet Commonly known as: SYNTHROID TAKE 1 TABLET (75 MCG TOTAL) BY MOUTH DAILY BEFORE BREAKFAST.   LORazepam 0.5 MG tablet Commonly known as: ATIVAN Take 0.5 mg by mouth daily.   losartan 25 MG tablet Commonly known as: COZAAR Take 12.5 mg by mouth daily. Hold if SBP<110   methocarbamol 500 MG tablet Commonly known as: Robaxin Take 0.5 tablets (250 mg total) by mouth 2 (two) times daily as needed for muscle spasms.   metoprolol tartrate 25 MG tablet Commonly known as: LOPRESSOR Take 12.5 mg by mouth 2 (two) times daily.   ondansetron 4 MG tablet Commonly known as: Zofran Take 1 tablet (4 mg total) by mouth 2 (two) times daily as needed for nausea or vomiting.   oxyCODONE 5 MG immediate release tablet Commonly known as: Roxicodone Take 0.5 tablets (2.5 mg total) by mouth every 6 (six) hours as needed for up to 14 days for severe pain.   polyethylene glycol 17 g packet Commonly known as: MIRALAX / GLYCOLAX Take 17 g by mouth daily as needed for moderate constipation.   QUEtiapine 25 MG tablet Commonly known as: SEROQUEL Take 25 mg by mouth at bedtime.   sertraline 50 MG tablet Commonly known as: ZOLOFT Take 50 mg by mouth daily.   Vitamin D3 50 MCG (2000 UT) Tabs Take 1 tablet by mouth daily.   zinc oxide 20 % ointment Apply 1 application topically as needed for irritation.        Review of Systems  Unable to perform ROS: Dementia   Immunization History  Administered Date(s) Administered   DTaP 07/20/2013   Influenza, High Dose Seasonal PF 12/07/2016   Influenza,inj,Quad PF,6+ Mos 12/01/2017    Influenza-Unspecified 11/20/2014, 12/12/2015, 11/20/2019   Moderna SARS-COV2 Booster Vaccination 08/06/2020   Moderna Sars-Covid-2 Vaccination 03/05/2019, 04/02/2019   PFIZER(Purple Top)SARS-COV-2 Vaccination 04/01/2020, 11/19/2020   PPD Test 08/06/2014   Pneumococcal Conjugate-13 11/07/2013   Pneumococcal Polysaccharide-23 03/22/2017   Tetanus 03/02/2015   Zoster Recombinat (Shingrix) 06/07/2017, 08/26/2017   Zoster, Live 03/01/2006   Pertinent  Health Maintenance Due  Topic Date Due   OPHTHALMOLOGY EXAM  Never done   INFLUENZA VACCINE  05/29/2021 (Originally 09/29/2020)   HEMOGLOBIN A1C  07/09/2021   FOOT EXAM  07/23/2021   DEXA SCAN  Completed   Fall Risk 04/12/2020 04/13/2020 04/13/2020 04/14/2020 09/12/2020  Falls in the past year? - - - - 1  Was there an injury with Fall? - - - - 1  Fall Risk Category Calculator - - - -  3  Fall Risk Category - - - - High  Patient Fall Risk Level High fall risk High fall risk High fall risk High fall risk -  Patient at Risk for Falls Due to - - - - History of fall(s);Impaired balance/gait;Impaired mobility;Mental status change  Fall risk Follow up - - - - Falls evaluation completed;Education provided;Falls prevention discussed   Functional Status Survey:    Vitals:   01/14/21 0923  BP: (!) 142/88  Pulse: 90  Resp: 16  Temp: (!) 96 F (35.6 C)  SpO2: 96%  Weight: 132 lb 14.4 oz (60.3 kg)  Height: 5' (1.524 m)   Body mass index is 25.96 kg/m. Physical Exam Vitals reviewed.  Constitutional:      General: She is not in acute distress. HENT:     Head: Normocephalic.  Eyes:     General:        Right eye: No discharge.        Left eye: No discharge.  Neck:     Vascular: No carotid bruit.  Cardiovascular:     Rate and Rhythm: Normal rate and regular rhythm.     Pulses: Normal pulses.     Heart sounds: Normal heart sounds. No murmur heard. Pulmonary:     Effort: Pulmonary effort is normal. No respiratory distress.     Breath sounds:  Normal breath sounds. No wheezing.  Abdominal:     General: Bowel sounds are normal. There is no distension.     Palpations: Abdomen is soft.     Tenderness: There is no abdominal tenderness.  Musculoskeletal:        General: Tenderness present.     Cervical back: Normal and normal range of motion. No tenderness.     Thoracic back: Normal. No tenderness.     Lumbar back: Normal. No tenderness.     Left hip: Tenderness present. No deformity or crepitus.     Left upper leg: Tenderness present. No swelling or deformity.     Left knee: Swelling present. No deformity. Normal range of motion. No tenderness.     Right lower leg: No edema.     Left lower leg: No swelling, deformity or tenderness.     Comments: Left hip/femur no pain with palpation, cannot perform straight leg raise/knee/chest due to pain, leg lengths appear even, ankle with 20 degree internal rotation.   Lymphadenopathy:     Cervical: No cervical adenopathy.  Skin:    General: Skin is warm and dry.     Capillary Refill: Capillary refill takes less than 2 seconds.  Neurological:     General: No focal deficit present.     Mental Status: She is alert. Mental status is at baseline.     Motor: Weakness present.     Gait: Gait abnormal.  Psychiatric:        Mood and Affect: Mood normal.        Behavior: Behavior normal.    Labs reviewed: Recent Labs    04/10/20 0155 04/11/20 0136 04/14/20 0354 04/17/20 0000 11/20/20 0000 11/21/20 0000 11/21/20 0110  NA 138 141 142   < > 141 139 141  K 3.9 3.5 3.4*   < > 3.7 3.9 3.7  CL 104 108 103   < > 105 105 105  CO2 23 23 25    < > 26* 26* 26*  GLUCOSE 150* 106* 101*  --   --   --   --   BUN 12 16 17    < >  20 20 20   CREATININE 0.62 0.57 0.61   < > 0.7 0.7 0.7  CALCIUM 8.5* 8.0* 8.3*   < > 8.4* 8.6* 8.4*   < > = values in this interval not displayed.   Recent Labs    11/20/20 0000 11/21/20 0000 11/21/20 0110  AST 13 15 13   ALT 8 9 8   ALKPHOS 65 63 65  ALBUMIN 3.2* 3.3*  3.2*   Recent Labs    04/11/20 0136 04/12/20 0200 04/12/20 1841 04/14/20 0354 05/01/20 0000 07/17/20 0000 11/20/20 0000 11/21/20 0000 11/21/20 0110  WBC 10.2 9.0  --  11.0*   < > 9.0 7.1 6.8 7.1  NEUTROABS  --   --   --   --    < > 5,517.00 5,730.00 4,821.00 5,730.00  HGB 8.1* 7.5*   < > 9.4*   < > 11.3* 10.7* 10.6* 10.7*  HCT 26.2* 23.4*   < > 30.9*   < > 36 34* 34* 34*  MCV 84.8 83.9  --  85.8  --   --   --   --   --   PLT 276 270  --  317   < > 258  --  218 220   < > = values in this interval not displayed.   Lab Results  Component Value Date   TSH 0.69 11/21/2020   Lab Results  Component Value Date   HGBA1C 5.5 01/09/2021   Lab Results  Component Value Date   CHOL 96 11/21/2020   HDL 33 (A) 11/21/2020   LDLCALC 45 11/21/2020   TRIG 94 11/21/2020   CHOLHDL 3.0 12/13/2019    Significant Diagnostic Results in last 30 days:  No results found.  Assessment/Plan 1. Left leg pain - increased pain with movement post fall 11/08 - hx left femur fracture 04/2020 - cannot lift leg or perform knee/chest, ankle internal rotated 20 degrees, legs appear even - xray left hip and femur - cont oxycodone 2.5 mg Q6hrs prn for pain - cont robaxin 250 mg po bid prn  2. Vascular dementia with behavior disturbance - ? Leg pain is behaviors- yells with any movement - cont Depakote and Seroquel - cont skilled nursing care  3. Primary hypertension -controlled - cont losartan and metoprolol  4. Diabetes mellitus type 2 in nonobese (HCC) - A1c controlled - not on medication at this time  5. Mixed hyperlipidemia - LDL 45 10/2020 - cont statin  6. Slow transit constipation - abdomen soft - cont colace and miralax  7. Acquired hypothyroidism - cont levothyroxine  8. Depression with anxiety - cont zoloft and ativan     Family/ staff Communication: plan discussed with patient, daughter and nurse  Labs/tests ordered:   Xray left hip/femur

## 2021-01-15 ENCOUNTER — Non-Acute Institutional Stay (SKILLED_NURSING_FACILITY): Payer: Medicare Other | Admitting: Internal Medicine

## 2021-01-15 DIAGNOSIS — M25552 Pain in left hip: Secondary | ICD-10-CM | POA: Diagnosis not present

## 2021-01-15 DIAGNOSIS — E119 Type 2 diabetes mellitus without complications: Secondary | ICD-10-CM | POA: Diagnosis not present

## 2021-01-15 DIAGNOSIS — D500 Iron deficiency anemia secondary to blood loss (chronic): Secondary | ICD-10-CM | POA: Diagnosis not present

## 2021-01-15 DIAGNOSIS — F418 Other specified anxiety disorders: Secondary | ICD-10-CM | POA: Diagnosis not present

## 2021-01-15 DIAGNOSIS — E039 Hypothyroidism, unspecified: Secondary | ICD-10-CM | POA: Diagnosis not present

## 2021-01-15 DIAGNOSIS — I10 Essential (primary) hypertension: Secondary | ICD-10-CM | POA: Diagnosis not present

## 2021-01-15 DIAGNOSIS — E782 Mixed hyperlipidemia: Secondary | ICD-10-CM | POA: Diagnosis not present

## 2021-01-15 DIAGNOSIS — F01518 Vascular dementia, unspecified severity, with other behavioral disturbance: Secondary | ICD-10-CM | POA: Diagnosis not present

## 2021-01-15 NOTE — Progress Notes (Signed)
Location: [facility] Room Number: [id number] Place of Service:  SNF [phone])  Provider: Veleta Miners MD  Code Status: DNR Goals of Care:  Advanced Directives 01/16/2021  Does Patient Have a Medical Advance Directive? Yes  Type of Paramedic of [place];Living will;Out of facility DNR (pink MOST or yellow form)  Does patient want to make changes to medical advance directive? No - Patient declined  Copy of [place] in Chart? Yes - validated most recent copy scanned in chart (See row information)  Would patient like information on creating a medical advance directive? -  Pre-existing out of facility DNR order (yellow form or pink MOST form) Yellow form placed in chart (order not valid for inpatient use)     Chief Complaint  Patient presents with   Acute Visit    HPI: [name] a 87 y.o. female seen today for an acute visit for Left leg Pain and her Pain Meds to evaluate   Weight: 132 lb 14.4 oz (60.3 kg) Patient has a history of type 2 diabetes, hyperlipidemia, depression, dementia, hypothyroidism, osteopenia, stress and urge urinary incontinence, Tachycardia   Patient fell in front of the nursing station from her wheelchair on 11/08.  She complained of neck and back pain and her x-rays were negative.  But since then she was unable to walk. Everytime the staff will try to get her up she will scream.  She was started on oxycodone and Robaxin as needed.  Finally her daughter agreed to do a left hip x-ray.  Her x-ray came back negative for any new fracture.  Her hardware is intact s/p ORIF  Today when I went to examine her she was screaming every time I will touch her anywhere.  I was able to calm her down and was able to move her right leg and eventually her left leg. Daughter also wants her off some of the medications because patient just stays in the bed and sleeps most of the time    Past Medical History:  Diagnosis Date    Hearing loss    History of fracture of clavicle    Hypothyroidism    Insomnia    Tinnitus     Past Surgical History:  Procedure Laterality Date   CHOLECYSTECTOMY  2007   Dr. Verdene Lennert   CLAVICLE EXCISION  1986   INTRAMEDULLARY (IM) NAIL INTERTROCHANTERIC Left 04/09/2020   Procedure: LEFT INTERTROCHANTERIC INTRAMEDULLARY (IM) NAIL;  Surgeon: Leandrew Koyanagi, MD;  Location: [facility];  Service: Orthopedics;  Laterality: Left;   MOHS SURGERY     past 10 years chest & legs   OPEN REDUCTION INTERNAL FIXATION (ORIF) DISTAL RADIAL FRACTURE Left 04/09/2020   Procedure: CLOSED REDUCTION LEFT DISTAL RADIUS FRACTURE;  Surgeon: Leandrew Koyanagi, MD;  Location: [facility];  Service: Orthopedics;  Laterality: Left;    Allergies  Allergen Reactions   Metformin And Related Diarrhea    Outpatient Encounter Medications as of 01/15/2021  Medication Sig   acetaminophen (TYLENOL) 325 MG tablet Take 650 mg by mouth 3 (three) times daily as needed.   atorvastatin (LIPITOR) 10 MG tablet Take 10 mg by mouth daily.   Cholecalciferol (VITAMIN D3) 2000 units TABS Take 1 tablet by mouth daily.    divalproex (DEPAKOTE) 125 MG DR tablet Take 125 mg by mouth 2 (two) times daily.   docusate sodium (COLACE) 100 MG capsule Take 100 mg by mouth daily.   levothyroxine (SYNTHROID) 75 MCG tablet TAKE  1 TABLET (75 MCG TOTAL) BY MOUTH DAILY BEFORE BREAKFAST.   LORazepam (ATIVAN) 0.5 MG tablet Take 0.5 mg by mouth daily.   losartan (COZAAR) 25 MG tablet Take 12.5 mg by mouth daily. Hold if SBP<110   methocarbamol (ROBAXIN) 500 MG tablet Take 0.5 tablets (250 mg total) by mouth 2 (two) times daily as needed for muscle spasms.   metoprolol tartrate (LOPRESSOR) 25 MG tablet Take 12.5 mg by mouth 2 (two) times daily.   ondansetron (ZOFRAN) 4 MG tablet Take 1 tablet (4 mg total) by mouth 2 (two) times daily as needed for nausea or vomiting.   polyethylene glycol (MIRALAX / GLYCOLAX) 17 g packet Take 17 g by mouth daily as needed for moderate  constipation.   QUEtiapine (SEROQUEL) 25 MG tablet Take 25 mg by mouth at bedtime.   sertraline (ZOLOFT) 50 MG tablet Take 50 mg by mouth daily.   zinc oxide 20 % ointment Apply 1 application topically as needed for irritation.   [DISCONTINUED] oxyCODONE (ROXICODONE) 5 MG immediate release tablet Take 0.5 tablets (2.5 mg total) by mouth every 6 (six) hours as needed for up to 14 days for severe pain.   No facility-administered encounter medications on file as of 01/15/2021.    Review of Systems:  Review of Systems  Constitutional:  Positive for activity change and appetite change.  HENT: Negative.    Respiratory: Negative.    Cardiovascular: Negative.   Gastrointestinal:  Positive for constipation.  Genitourinary: Negative.   Musculoskeletal:  Positive for arthralgias, back pain, gait problem and myalgias.  Skin: Negative.   Neurological:  Positive for weakness.  Psychiatric/Behavioral:  Positive for agitation, behavioral problems and confusion. The patient is nervous/anxious.    Health Maintenance  Topic Date Due   OPHTHALMOLOGY EXAM  Never done   COVID-19 Vaccine (5 - Booster for Moderna series) 01/14/2021   INFLUENZA VACCINE  05/29/2021 (Originally 09/29/2020)   TETANUS/TDAP  11/24/2026 (Originally 03/01/2025)   HEMOGLOBIN A1C  07/09/2021   FOOT EXAM  07/23/2021   Pneumonia Vaccine 7+ Years old  Completed   DEXA SCAN  Completed   Zoster Vaccines- Shingrix  Completed   HPV VACCINES  Aged Out    Physical Exam: Vitals:   01/16/21 0947  BP: (!) 153/74  Pulse: 90  Resp: 16  Temp: 97.6 F (36.4 C)  SpO2: 94%  Weight: 132 lb 14.4 oz (60.3 kg)  Height: 5' (1.524 m)   Body mass index is 25.96 kg/m. Physical Exam Vitals reviewed.  Constitutional:      Appearance: Normal appearance.  HENT:     Head: Normocephalic.     Nose: Nose normal.     Mouth/Throat:     Mouth: Mucous membranes are moist.     Pharynx: Oropharynx is clear.  Eyes:     Pupils: Pupils are equal,  round, and reactive to light.  Cardiovascular:     Rate and Rhythm: Normal rate and regular rhythm.     Pulses: Normal pulses.  Pulmonary:     Effort: Pulmonary effort is normal.     Breath sounds: Normal breath sounds.  Abdominal:     General: Abdomen is flat. Bowel sounds are normal.     Palpations: Abdomen is soft.  Musculoskeletal:        General: No swelling.     Cervical back: Neck supple.  Skin:    General: Skin is warm.  Neurological:     Mental Status: She is alert.     Comments:  I was able to move her Right Hip with her not screaming. She still resists to move her Left Hip.   Psychiatric:     Comments: Screams every time we try to move her. Says she does not want to be moved    Labs reviewed: Basic Metabolic Panel: Recent Labs    04/10/20 0155 04/11/20 0136 04/14/20 0354 04/17/20 0000 11/20/20 0000 11/21/20 0000 11/21/20 0110  NA 138 141 142   < > 141 139 141  K 3.9 3.5 3.4*   < > 3.7 3.9 3.7  CL 104 108 103   < > 105 105 105  CO2 23 23 25    < > 26* 26* 26*  GLUCOSE 150* 106* 101*  --   --   --   --   BUN 12 16 17    < > 20 20 20   CREATININE 0.62 0.57 0.61   < > 0.7 0.7 0.7  CALCIUM 8.5* 8.0* 8.3*   < > 8.4* 8.6* 8.4*  TSH  --   --   --   --   --  0.64 0.69   < > = values in this interval not displayed.   Liver Function Tests: Recent Labs    11/20/20 0000 11/21/20 0000 11/21/20 0110  AST 13 15 13   ALT 8 9 8   ALKPHOS 65 63 65  ALBUMIN 3.2* 3.3* 3.2*   No results for input(s): LIPASE, AMYLASE in the last 8760 hours. No results for input(s): AMMONIA in the last 8760 hours. CBC: Recent Labs    04/11/20 0136 04/12/20 0200 04/12/20 1841 04/14/20 0354 05/01/20 0000 07/17/20 0000 11/20/20 0000 11/21/20 0000 11/21/20 0110  WBC 10.2 9.0  --  11.0*   < > 9.0 7.1 6.8 7.1  NEUTROABS  --   --   --   --    < > 5,517.00 5,730.00 4,821.00 5,730.00  HGB 8.1* 7.5*   < > 9.4*   < > 11.3* 10.7* 10.6* 10.7*  HCT 26.2* 23.4*   < > 30.9*   < > 36 34* 34* 34*   MCV 84.8 83.9  --  85.8  --   --   --   --   --   PLT 276 270  --  317   < > 258  --  218 220   < > = values in this interval not displayed.   Lipid Panel: Recent Labs    11/20/20 0000 11/21/20 0000 11/21/20 0110  CHOL 96 96 96  HDL 33* 33* 33*  LDLCALC 45 45 45  TRIG 94 94 94   Lab Results  Component Value Date   HGBA1C 5.5 01/09/2021    Procedures since last visit: No results found.  Assessment/Plan Left hip pain Her Xray doone in the facility is all negative Will discontinue Oxycodone right now per her daughter request Just Tylenol prn Also Has Robaxin Prn We were able to make her sit with the therpay in her Wheelchair. She was total assist I d/w the Daughter that if her pain Continues she will need to see ortho for further Imaging Vascular dementia with behavior disturbance It seems she is sleeping more  Discontinue Depakote in the am Continue PM dose Also make ativan Prn Will try ot Taper Seroquel Primary hypertension Continue Losartan and Lopressor Diabetes mellitus type 2 in nonobese ([place]) Off all Hypoglycemics as her CBG are in good level and A1C was 5.5  Mixed hyperlipidemia Continue Statin for now Acquired hypothyroidism TSH normal  in 9/22 Depression with anxiety On Zoloft  Anemia, blood loss Hgb stable   Labs/tests ordered:  * No order type specified * Next appt:  Visit date not found

## 2021-01-16 ENCOUNTER — Encounter: Payer: Self-pay | Admitting: Internal Medicine

## 2021-02-09 ENCOUNTER — Non-Acute Institutional Stay (SKILLED_NURSING_FACILITY): Payer: Medicare Other | Admitting: Orthopedic Surgery

## 2021-02-09 ENCOUNTER — Encounter: Payer: Self-pay | Admitting: Orthopedic Surgery

## 2021-02-09 DIAGNOSIS — F418 Other specified anxiety disorders: Secondary | ICD-10-CM

## 2021-02-09 DIAGNOSIS — K5901 Slow transit constipation: Secondary | ICD-10-CM | POA: Diagnosis not present

## 2021-02-09 DIAGNOSIS — R634 Abnormal weight loss: Secondary | ICD-10-CM

## 2021-02-09 DIAGNOSIS — E119 Type 2 diabetes mellitus without complications: Secondary | ICD-10-CM

## 2021-02-09 DIAGNOSIS — E782 Mixed hyperlipidemia: Secondary | ICD-10-CM | POA: Diagnosis not present

## 2021-02-09 DIAGNOSIS — E039 Hypothyroidism, unspecified: Secondary | ICD-10-CM

## 2021-02-09 DIAGNOSIS — M25552 Pain in left hip: Secondary | ICD-10-CM | POA: Diagnosis not present

## 2021-02-09 DIAGNOSIS — F01518 Vascular dementia, unspecified severity, with other behavioral disturbance: Secondary | ICD-10-CM

## 2021-02-09 DIAGNOSIS — I10 Essential (primary) hypertension: Secondary | ICD-10-CM | POA: Diagnosis not present

## 2021-02-09 NOTE — Progress Notes (Signed)
Location:   [facility] Room Number: 51 A Place of Service:  SNF (31) Provider:  Windell Moulding, NP  [name], MD  Patient Care Team: [name], MD as PCP - General (Internal Medicine)  Extended Emergency Contact Information Primary Emergency Contact: Harris,Margaret Address: [address]          [place], [place] [address] [name] of [place] Phone: [phone] Mobile Phone: [phone] Relation: Daughter  Code Status:  DNR Goals of care: Advanced Directive information Advanced Directives 02/09/2021  Does Patient Have a Medical Advance Directive? Yes  Type of Paramedic of [place];Living will;Out of facility DNR (pink MOST or yellow form)  Does patient want to make changes to medical advance directive? No - Patient declined  Copy of [place] in Chart? Yes - validated most recent copy scanned in chart (See row information)  Would patient like information on creating a medical advance directive? -  Pre-existing out of facility DNR order (yellow form or pink MOST form) Yellow form placed in chart (order not valid for inpatient use)     Chief Complaint  Patient presents with   Medical Management of Chronic Issues    Routine visit   Quality Metric Gaps    Eye exam, COVID#5    HPI:  Pt is a 87 y.o. female seen today for medical management of chronic diseases.    She currently resides on the skilled nursing unit at [facility]. Past medical includes: HTN, constipation, T2DM, hypothyroidism, OA, urinary retention, vascular dementia, depression and anxiety.   Left leg pain- 11/16 left hip xray negative for fracture or dislocation, hardware intact, mild pain with transfers, off oxycodone at this time Dementia- BIMS 5 09/2020, CT head 04/2020 age related atrophy and chronic small vessel ischemia, sleeping more in the morning, leaving room less since fall last month, staff giving verbal reminders  of activities offered, Depakote reduced last month to QPM, remains on Seroquel , AST/ALT 8/13 11/20/2020 HTN- BUN/creat 20/0.7 11/20/2020, remains on metoprolol and losartan T2DM- a1c 5.5 01/09/2021, blood sugars averaging 100-120, not on medications at this time HLD- LDL 45 11/20/2020, remains on Lipitor Constipation- LBM 12/12, colace daily, miralax prn Hypothyroidism- TSH 0.69 11/21/2020, levothyroxine  Depression/anxiety- no recent panic attacks for increased depression, Zoloft daily, ativan prn  No recent falls or injuries. Ambulates with wheelchair.   Recent blood pressures:  12/12- 152/76  12/11- 132/70  12/10- 141/85  Recent weights:  12/08- 125.5 lbs  11/01- 132.9 lbs  10/04- 133.5 lbs      Past Medical History:  Diagnosis Date   Hearing loss    History of fracture of clavicle    Hypothyroidism    Insomnia    Tinnitus    Past Surgical History:  Procedure Laterality Date   CHOLECYSTECTOMY  2007   Dr. Verdene Lennert   CLAVICLE EXCISION  1986   INTRAMEDULLARY (IM) NAIL INTERTROCHANTERIC Left 04/09/2020   Procedure: LEFT INTERTROCHANTERIC INTRAMEDULLARY (IM) NAIL;  Surgeon: Leandrew Koyanagi, MD;  Location: [facility];  Service: Orthopedics;  Laterality: Left;   MOHS SURGERY     past 10 years chest & legs   OPEN REDUCTION INTERNAL FIXATION (ORIF) DISTAL RADIAL FRACTURE Left 04/09/2020   Procedure: CLOSED REDUCTION LEFT DISTAL RADIUS FRACTURE;  Surgeon: Leandrew Koyanagi, MD;  Location: [facility];  Service: Orthopedics;  Laterality: Left;    Allergies  Allergen Reactions   Metformin And Related Diarrhea    Allergies  as of 02/09/2021       Reactions   Metformin And Related Diarrhea        Medication List        Accurate as of February 09, 2021  2:02 PM. If you have any questions, ask your nurse or doctor.          STOP taking these medications    methocarbamol 500 MG tablet Commonly known as: Robaxin Stopped by: Yvonna Alanis, NP       TAKE these medications     acetaminophen 325 MG tablet Commonly known as: TYLENOL Take 650 mg by mouth 3 (three) times daily as needed.   atorvastatin 10 MG tablet Commonly known as: LIPITOR Take 10 mg by mouth daily.   divalproex 125 MG DR tablet Commonly known as: DEPAKOTE Take 125 mg by mouth daily.   docusate sodium 100 MG capsule Commonly known as: COLACE Take 100 mg by mouth daily.   levothyroxine 75 MCG tablet Commonly known as: SYNTHROID TAKE 1 TABLET (75 MCG TOTAL) BY MOUTH DAILY BEFORE BREAKFAST.   LORazepam 0.5 MG tablet Commonly known as: ATIVAN Take 0.5 mg by mouth 2 (two) times daily as needed.   losartan 25 MG tablet Commonly known as: COZAAR Take 12.5 mg by mouth daily. Hold if SBP<110   metoprolol tartrate 25 MG tablet Commonly known as: LOPRESSOR Take 12.5 mg by mouth 2 (two) times daily.   ondansetron 4 MG tablet Commonly known as: Zofran Take 1 tablet (4 mg total) by mouth 2 (two) times daily as needed for nausea or vomiting.   polyethylene glycol 17 g packet Commonly known as: MIRALAX / GLYCOLAX Take 17 g by mouth daily as needed for moderate constipation.   QUEtiapine 25 MG tablet Commonly known as: SEROQUEL Take 25 mg by mouth at bedtime.   sertraline 50 MG tablet Commonly known as: ZOLOFT Take 50 mg by mouth daily.   Vitamin D3 50 MCG (2000 UT) Tabs Take 1 tablet by mouth daily.   zinc oxide 20 % ointment Apply 1 application topically as needed for irritation.        Review of Systems  Unable to perform ROS: Dementia   Immunization History  Administered Date(s) Administered   DTaP 07/20/2013   Influenza, High Dose Seasonal PF 12/07/2016   Influenza,inj,Quad PF,6+ Mos 12/01/2017   Influenza-Unspecified 11/20/2014, 12/12/2015, 11/20/2019   Moderna SARS-COV2 Booster Vaccination 08/06/2020   Moderna Sars-Covid-2 Vaccination 03/05/2019, 04/02/2019   PFIZER(Purple Top)SARS-COV-2 Vaccination 04/01/2020, 11/19/2020   PPD Test 08/06/2014   Pneumococcal  Conjugate-13 11/07/2013   Pneumococcal Polysaccharide-23 03/22/2017   Tetanus 03/02/2015   Zoster Recombinat (Shingrix) 06/07/2017, 08/26/2017   Zoster, Live 03/01/2006   Pertinent  Health Maintenance Due  Topic Date Due   OPHTHALMOLOGY EXAM  Never done   INFLUENZA VACCINE  05/29/2021 (Originally 09/29/2020)   HEMOGLOBIN A1C  07/09/2021   FOOT EXAM  07/23/2021   DEXA SCAN  Completed   Fall Risk 04/12/2020 04/13/2020 04/13/2020 04/14/2020 09/12/2020  Falls in the past year? - - - - 1  Was there an injury with Fall? - - - - 1  Fall Risk Category Calculator - - - - 3  Fall Risk Category - - - - High  Patient Fall Risk Level High fall risk High fall risk High fall risk High fall risk -  Patient at Risk for Falls Due to - - - - History of fall(s);Impaired balance/gait;Impaired mobility;Mental status change  Fall risk Follow up - - - -  Falls evaluation completed;Education provided;Falls prevention discussed   Functional Status Survey:    Vitals:   02/09/21 1351  BP: (!) 152/76  Pulse: 83  Resp: 18  Temp: (!) 97.5 F (36.4 C)  SpO2: 94%  Weight: 125 lb 8 oz (56.9 kg)  Height: 5' (1.524 m)   Body mass index is 24.51 kg/m. Physical Exam Vitals reviewed.  Constitutional:      General: She is not in acute distress. HENT:     Head: Normocephalic.     Right Ear: There is no impacted cerumen.     Left Ear: There is no impacted cerumen.     Nose: Nose normal.     Mouth/Throat:     Mouth: Mucous membranes are moist.  Eyes:     General:        Right eye: No discharge.        Left eye: No discharge.  Neck:     Vascular: No carotid bruit.  Cardiovascular:     Rate and Rhythm: Normal rate and regular rhythm.     Pulses: Normal pulses.     Heart sounds: Normal heart sounds. No murmur heard. Pulmonary:     Effort: Pulmonary effort is normal. No respiratory distress.     Breath sounds: Normal breath sounds. No wheezing.  Abdominal:     General: Bowel sounds are normal. There is no  distension.     Palpations: Abdomen is soft.     Tenderness: There is no abdominal tenderness.  Musculoskeletal:     Cervical back: Normal range of motion.     Right lower leg: No edema.     Left lower leg: No swelling, deformity or tenderness.  Lymphadenopathy:     Cervical: No cervical adenopathy.  Skin:    General: Skin is warm and dry.     Capillary Refill: Capillary refill takes less than 2 seconds.  Neurological:     General: No focal deficit present.     Mental Status: She is alert. Mental status is at baseline.     Motor: Weakness present.     Gait: Gait abnormal.     Comments: wheelchair  Psychiatric:        Mood and Affect: Mood normal.        Behavior: Behavior normal.        Cognition and Memory: Memory is impaired.     Comments: Very pleasant, follows commands, alert to self/place/familiar faces    Labs reviewed: Recent Labs    04/10/20 0155 04/11/20 0136 04/14/20 0354 04/17/20 0000 11/20/20 0000 11/21/20 0000 11/21/20 0110  NA 138 141 142   < > 141 139 141  K 3.9 3.5 3.4*   < > 3.7 3.9 3.7  CL 104 108 103   < > 105 105 105  CO2 23 23 25    < > 26* 26* 26*  GLUCOSE 150* 106* 101*  --   --   --   --   BUN 12 16 17    < > 20 20 20   CREATININE 0.62 0.57 0.61   < > 0.7 0.7 0.7  CALCIUM 8.5* 8.0* 8.3*   < > 8.4* 8.6* 8.4*   < > = values in this interval not displayed.   Recent Labs    11/20/20 0000 11/21/20 0000 11/21/20 0110  AST 13 15 13   ALT 8 9 8   ALKPHOS 65 63 65  ALBUMIN 3.2* 3.3* 3.2*   Recent Labs    04/11/20 0136 04/12/20 0200  04/12/20 1841 04/14/20 0354 05/01/20 0000 07/17/20 0000 11/20/20 0000 11/21/20 0000 11/21/20 0110  WBC 10.2 9.0  --  11.0*   < > 9.0 7.1 6.8 7.1  NEUTROABS  --   --   --   --    < > 5,517.00 5,730.00 4,821.00 5,730.00  HGB 8.1* 7.5*   < > 9.4*   < > 11.3* 10.7* 10.6* 10.7*  HCT 26.2* 23.4*   < > 30.9*   < > 36 34* 34* 34*  MCV 84.8 83.9  --  85.8  --   --   --   --   --   PLT 276 270  --  317   < > 258  --   218 220   < > = values in this interval not displayed.   Lab Results  Component Value Date   TSH 0.69 11/21/2020   Lab Results  Component Value Date   HGBA1C 5.5 01/09/2021   Lab Results  Component Value Date   CHOL 96 11/21/2020   HDL 33 (A) 11/21/2020   LDLCALC 45 11/21/2020   TRIG 94 11/21/2020   CHOLHDL 3.0 12/13/2019    Significant Diagnostic Results in last 30 days:  No results found.  Assessment/Plan 1. Left hip pain - 11/16 xray left hip unremarkable - mild pain noted with transfers - off oxycodone - cont tylenol prn  2. Vascular dementia with behavior disturbance - no behavioral outbursts - less talkative - Depakote reduced to QPM- less sleepy in AM - cont Depakote, Seroquel and Ativan prn - cont skilled nursing care  3. Primary hypertension - controlled - cont losartan and metoprolol  4. Diabetes mellitus type 2 in nonobese (HCC) - A1c 5.5 01/09/2021  5. Mixed hyperlipidemia - cont statin  6. Slow transit constipation - LBM 12/12, abdomen soft - cont colace and miralax prn  7. Acquired hypothyroidism - TSH normal - cont levothyroxine  8. Depression with anxiety - cont zoloft  9. Weight loss - 8 lbs down from last month - suspect from left leg pain and narcotic use - cont Boost Qhs    Family/ staff Communication: plan discussed with daughter and nurse  Labs/tests ordered:  none

## 2021-03-16 ENCOUNTER — Non-Acute Institutional Stay (SKILLED_NURSING_FACILITY): Payer: Medicare Other | Admitting: Orthopedic Surgery

## 2021-03-16 ENCOUNTER — Encounter: Payer: Self-pay | Admitting: Orthopedic Surgery

## 2021-03-16 DIAGNOSIS — E039 Hypothyroidism, unspecified: Secondary | ICD-10-CM

## 2021-03-16 DIAGNOSIS — E782 Mixed hyperlipidemia: Secondary | ICD-10-CM

## 2021-03-16 DIAGNOSIS — F418 Other specified anxiety disorders: Secondary | ICD-10-CM

## 2021-03-16 DIAGNOSIS — E119 Type 2 diabetes mellitus without complications: Secondary | ICD-10-CM

## 2021-03-16 DIAGNOSIS — I10 Essential (primary) hypertension: Secondary | ICD-10-CM

## 2021-03-16 DIAGNOSIS — K5901 Slow transit constipation: Secondary | ICD-10-CM | POA: Diagnosis not present

## 2021-03-16 DIAGNOSIS — F01518 Vascular dementia, unspecified severity, with other behavioral disturbance: Secondary | ICD-10-CM

## 2021-03-16 DIAGNOSIS — M25552 Pain in left hip: Secondary | ICD-10-CM | POA: Diagnosis not present

## 2021-03-16 DIAGNOSIS — R634 Abnormal weight loss: Secondary | ICD-10-CM

## 2021-03-16 NOTE — Progress Notes (Signed)
Location:   [facility] Room Number: 39-A Place of Service:  SNF [phone]) Provider:  Windell Moulding, NP    Patient Care Team: [name], MD as PCP - General (Internal Medicine)  Extended Emergency Contact Information Primary Emergency Contact: Harris,Margaret Address: [place],  [address] [name] of [place] Phone: [phone] Mobile Phone: [phone] Relation: Daughter  Code Status:  DNR  Goals of care: Advanced Directive information Advanced Directives 03/16/2021  Does Patient Have a Medical Advance Directive? Yes  Type of Paramedic of [place];Living will;Out of facility DNR (pink MOST or yellow form)  Does patient want to make changes to medical advance directive? No - Patient declined  Copy of [place] in Chart? Yes - validated most recent copy scanned in chart (See row information)  Would patient like information on creating a medical advance directive? -  Pre-existing out of facility DNR order (yellow form or pink MOST form) -     Chief Complaint  Patient presents with   Medical Management of Chronic Issues    Routine Visit.    Health Maintenance    Discuss the need for Eye exam.    HPI:  Pt is a 88 y.o. female seen today for medical management of chronic diseases.    She currently resides on the skilled nursing unit at [facility]. Past medical includes: HTN, constipation, T2DM, hypothyroidism, OA, urinary retention, vascular dementia, depression and anxiety.   Dementia- BIMS 5 09/2020, CT head 04/2020 age related atrophy and chronic small vessel ischemia, spending more time in bed, talking less, gives simple answers like "yes" and "no", remains on Depakote, Seroquel and Ativan HTN- BUN/creat 20/0.7 11/20/2020, remains on metoprolol and losartan T2DM- a1c 5.5 01/09/2021, blood sugars averaging 100-120, not on medications at this time HLD- LDL 45 11/20/2020,  remains on Lipitor Left hip pain- denies pain today, 11/16 xray unremarkable, pain increased with movement, off oxycodone, remains on tylenol prn, palliative consulted 03/11/2021 Constipation- LBM 01/13, colace daily, miralax prn Hypothyroidism- TSH 0.69 11/21/2020, levothyroxine  Depression/anxiety- no mood changes or panic attacks, Zoloft daily, ativan prn Weight loss- down 12 lbs from November 2022  No recent falls or injuries. No recent behavioral outbursts.   Recent blood pressures:  01/15- 152/86  01/14- 136/72  01/13- 147/80  01/12- 144/81  Recent weights:  01/11- 124.1 lbs  12/14- 125.8 lbs  11/01- 132.9 lbs    Past Medical History:  Diagnosis Date   Hearing loss    History of fracture of clavicle    Hypothyroidism    Insomnia    Tinnitus    Past Surgical History:  Procedure Laterality Date   CHOLECYSTECTOMY  2007   Dr. Verdene Lennert   CLAVICLE EXCISION  1986   INTRAMEDULLARY (IM) NAIL INTERTROCHANTERIC Left 04/09/2020   Procedure: LEFT INTERTROCHANTERIC INTRAMEDULLARY (IM) NAIL;  Surgeon: Leandrew Koyanagi, MD;  Location: [facility];  Service: Orthopedics;  Laterality: Left;   MOHS SURGERY     past 10 years chest & legs   OPEN REDUCTION INTERNAL FIXATION (ORIF) DISTAL RADIAL FRACTURE Left 04/09/2020   Procedure: CLOSED REDUCTION LEFT DISTAL RADIUS FRACTURE;  Surgeon: Leandrew Koyanagi, MD;  Location: [facility];  Service: Orthopedics;  Laterality: Left;    Allergies  Allergen Reactions   Metformin And Related Diarrhea    Allergies as of 03/16/2021       Reactions   Metformin  And Related Diarrhea        Medication List        Accurate as of March 16, 2021 10:10 AM. If you have any questions, ask your nurse or doctor.          acetaminophen 325 MG tablet Commonly known as: TYLENOL Take 650 mg by mouth 3 (three) times daily as needed.   atorvastatin 10 MG tablet Commonly known as: LIPITOR Take 10 mg by mouth daily.   CeraVe Baby 1 % Lotn Generic drug: DIMETHICONE  (TOPICAL) Apply 1 application topically daily.   divalproex 125 MG DR tablet Commonly known as: DEPAKOTE Take 125 mg by mouth daily.   docusate sodium 100 MG capsule Commonly known as: COLACE Take 100 mg by mouth daily.   levothyroxine 75 MCG tablet Commonly known as: SYNTHROID TAKE 1 TABLET (75 MCG TOTAL) BY MOUTH DAILY BEFORE BREAKFAST.   LORazepam 0.5 MG tablet Commonly known as: ATIVAN Take 0.5 mg by mouth 2 (two) times daily as needed.   losartan 25 MG tablet Commonly known as: COZAAR Take 12.5 mg by mouth daily. Hold if SBP<110   metoprolol tartrate 25 MG tablet Commonly known as: LOPRESSOR Take 12.5 mg by mouth 2 (two) times daily.   ondansetron 4 MG tablet Commonly known as: Zofran Take 1 tablet (4 mg total) by mouth 2 (two) times daily as needed for nausea or vomiting.   polyethylene glycol 17 g packet Commonly known as: MIRALAX / GLYCOLAX Take 17 g by mouth daily as needed for moderate constipation.   QUEtiapine 25 MG tablet Commonly known as: SEROQUEL Take 25 mg by mouth at bedtime.   sertraline 50 MG tablet Commonly known as: ZOLOFT Take 50 mg by mouth daily.   Vitamin D3 50 MCG (2000 UT) Tabs Take 1 tablet by mouth daily.   zinc oxide 20 % ointment Apply 1 application topically as needed for irritation.        Review of Systems  Unable to perform ROS: Dementia   Immunization History  Administered Date(s) Administered   DTaP 07/20/2013   Influenza, High Dose Seasonal PF 12/07/2016   Influenza,inj,Quad PF,6+ Mos 12/01/2017   Influenza-Unspecified 11/20/2014, 12/12/2015, 11/20/2019   Moderna SARS-COV2 Booster Vaccination 08/06/2020   Moderna Sars-Covid-2 Vaccination 03/05/2019, 04/02/2019   PFIZER(Purple Top)SARS-COV-2 Vaccination 04/01/2020, 08/06/2020, 11/19/2020   PPD Test 08/06/2014   Pneumococcal Conjugate-13 11/07/2013   Pneumococcal Polysaccharide-23 03/22/2017   Tetanus 03/02/2015   Zoster Recombinat (Shingrix) 06/07/2017,  08/26/2017   Zoster, Live 03/01/2006   Pertinent  Health Maintenance Due  Topic Date Due   OPHTHALMOLOGY EXAM  Never done   INFLUENZA VACCINE  05/29/2021 (Originally 09/29/2020)   HEMOGLOBIN A1C  07/09/2021   FOOT EXAM  07/23/2021   DEXA SCAN  Completed   Fall Risk 04/12/2020 04/13/2020 04/13/2020 04/14/2020 09/12/2020  Falls in the past year? - - - - 1  Was there an injury with Fall? - - - - 1  Fall Risk Category Calculator - - - - 3  Fall Risk Category - - - - High  Patient Fall Risk Level High fall risk High fall risk High fall risk High fall risk -  Patient at Risk for Falls Due to - - - - History of fall(s);Impaired balance/gait;Impaired mobility;Mental status change  Fall risk Follow up - - - - Falls evaluation completed;Education provided;Falls prevention discussed   Functional Status Survey:    Vitals:   03/16/21 1006  BP: (!) 152/86  Pulse: 94  Resp: 16  Temp: 97.6 F (36.4 C)  SpO2: 95%  Weight: 124 lb 1.6 oz (56.3 kg)  Height: 5' (1.524 m)   Body mass index is 24.24 kg/m. Physical Exam Vitals reviewed.  Constitutional:      General: She is not in acute distress. HENT:     Head: Normocephalic.     Right Ear: There is no impacted cerumen.     Left Ear: There is no impacted cerumen.     Nose: Nose normal.     Mouth/Throat:     Mouth: Mucous membranes are moist.  Eyes:     General:        Right eye: No discharge.        Left eye: No discharge.  Neck:     Vascular: No carotid bruit.  Cardiovascular:     Rate and Rhythm: Normal rate and regular rhythm.     Pulses: Normal pulses.     Heart sounds: Normal heart sounds. No murmur heard. Pulmonary:     Effort: Pulmonary effort is normal. No respiratory distress.     Breath sounds: Normal breath sounds. No wheezing.  Abdominal:     General: Bowel sounds are normal. There is no distension.     Palpations: Abdomen is soft.     Tenderness: There is no abdominal tenderness.  Musculoskeletal:     Cervical back:  Normal range of motion.     Right lower leg: No edema.     Left lower leg: No edema.  Lymphadenopathy:     Cervical: No cervical adenopathy.  Skin:    General: Skin is warm and dry.     Capillary Refill: Capillary refill takes less than 2 seconds.  Neurological:     General: No focal deficit present.     Mental Status: She is alert. Mental status is at baseline.     Motor: Weakness present.     Gait: Gait abnormal.     Comments: Bed bound  Psychiatric:        Mood and Affect: Mood normal.        Behavior: Behavior normal.        Cognition and Memory: Memory is impaired.     Comments: Very pleasant, alert to self and familiar faces, gave yes/no responses    Labs reviewed: Recent Labs    04/10/20 0155 04/11/20 0136 04/14/20 0354 04/17/20 0000 11/20/20 0000 11/21/20 0000 11/21/20 0110  NA 138 141 142   < > 141 139 141  K 3.9 3.5 3.4*   < > 3.7 3.9 3.7  CL 104 108 103   < > 105 105 105  CO2 23 23 25    < > 26* 26* 26*  GLUCOSE 150* 106* 101*  --   --   --   --   BUN 12 16 17    < > 20 20 20   CREATININE 0.62 0.57 0.61   < > 0.7 0.7 0.7  CALCIUM 8.5* 8.0* 8.3*   < > 8.4* 8.6* 8.4*   < > = values in this interval not displayed.   Recent Labs    11/20/20 0000 11/21/20 0000 11/21/20 0110  AST 13 15 13   ALT 8 9 8   ALKPHOS 65 63 65  ALBUMIN 3.2* 3.3* 3.2*   Recent Labs    04/11/20 0136 04/12/20 0200 04/12/20 1841 04/14/20 0354 05/01/20 0000 07/17/20 0000 11/20/20 0000 11/21/20 0000 11/21/20 0110  WBC 10.2 9.0  --  11.0*   < > 9.0 7.1 6.8 7.1  NEUTROABS  --   --   --   --    < > 5,517.00 5,730.00 4,821.00 5,730.00  HGB 8.1* 7.5*   < > 9.4*   < > 11.3* 10.7* 10.6* 10.7*  HCT 26.2* 23.4*   < > 30.9*   < > 36 34* 34* 34*  MCV 84.8 83.9  --  85.8  --   --   --   --   --   PLT 276 270  --  317   < > 258  --  218 220   < > = values in this interval not displayed.   Lab Results  Component Value Date   TSH 0.69 11/21/2020   Lab Results  Component Value Date    HGBA1C 5.5 01/09/2021   Lab Results  Component Value Date   CHOL 96 11/21/2020   HDL 33 (A) 11/21/2020   LDLCALC 45 11/21/2020   TRIG 94 11/21/2020   CHOLHDL 3.0 12/13/2019    Significant Diagnostic Results in last 30 days:  No results found.  Assessment/Plan 1. Vascular dementia with behavior disturbance - suspect disease progression - talking less, decreased mobility - no behavioral outbursts - cont skilled nursing care - cont Depakote, Seroquel and Ativan - palliative consulted 03/11/2021  2. Primary hypertension - controlled - cont losartan  3. Diabetes mellitus type 2 in nonobese (HCC) - A1c 5.5 01/09/2022 - no on medication  4. Mixed hyperlipidemia - LDL 45 10/2020 - cont Lipitor  5. Left hip pain - pain increased with pain - ambulating less - palliative consulted 03/11/2021 - cont Tylenol prn  6. Slow transit constipation - LBM 01/13, abdomen soft - cont colace and miralax prn  7. Acquired hypothyroidism - TSH normal - cont levothyroxine  8. Depression with anxiety - no mood swings or panic attacks - cont zoloft and ativan  9. Weight loss - down 12 lbs from November - cont monthly weights     Family/ staff Communication: plan discussed with patient and nurse  Labs/tests ordered:  none

## 2021-03-23 ENCOUNTER — Other Ambulatory Visit: Payer: Self-pay

## 2021-03-23 ENCOUNTER — Non-Acute Institutional Stay: Payer: Medicare Other | Admitting: Family Medicine

## 2021-03-23 VITALS — BP 136/64 | HR 100 | Temp 97.8°F | Resp 16

## 2021-03-23 DIAGNOSIS — K5901 Slow transit constipation: Secondary | ICD-10-CM

## 2021-03-23 DIAGNOSIS — F03918 Unspecified dementia, unspecified severity, with other behavioral disturbance: Secondary | ICD-10-CM | POA: Diagnosis not present

## 2021-03-23 DIAGNOSIS — R269 Unspecified abnormalities of gait and mobility: Secondary | ICD-10-CM

## 2021-03-23 DIAGNOSIS — Z515 Encounter for palliative care: Secondary | ICD-10-CM
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
## 2021-03-24 ENCOUNTER — Encounter: Payer: Self-pay | Admitting: Family Medicine

## 2021-04-16 ENCOUNTER — Non-Acute Institutional Stay (SKILLED_NURSING_FACILITY): Payer: Medicare Other | Admitting: Internal Medicine

## 2021-04-16 ENCOUNTER — Encounter: Payer: Self-pay | Admitting: Internal Medicine

## 2021-04-16 DIAGNOSIS — F418 Other specified anxiety disorders: Secondary | ICD-10-CM

## 2021-04-16 DIAGNOSIS — F01518 Vascular dementia, unspecified severity, with other behavioral disturbance: Secondary | ICD-10-CM | POA: Diagnosis not present

## 2021-04-16 DIAGNOSIS — I10 Essential (primary) hypertension: Secondary | ICD-10-CM

## 2021-04-16 DIAGNOSIS — D500 Iron deficiency anemia secondary to blood loss (chronic): Secondary | ICD-10-CM

## 2021-04-16 DIAGNOSIS — R634 Abnormal weight loss: Secondary | ICD-10-CM

## 2021-04-16 DIAGNOSIS — E119 Type 2 diabetes mellitus without complications: Secondary | ICD-10-CM | POA: Diagnosis not present

## 2021-04-16 DIAGNOSIS — E782 Mixed hyperlipidemia: Secondary | ICD-10-CM | POA: Diagnosis not present

## 2021-04-16 DIAGNOSIS — E039 Hypothyroidism, unspecified: Secondary | ICD-10-CM | POA: Diagnosis not present

## 2021-04-16 NOTE — Progress Notes (Signed)
Location:   [facility] Room Number: 39 Place of Service:  SNF [phone]) Provider:  Veleta Miners MD  [name], MD  Patient Care Team: [name], MD as PCP - General (Internal Medicine)  Extended Emergency Contact Information Primary Emergency Contact: Harris,Margaret Address: [address]          [place],  [address] [name] of [place] Phone: [phone] Mobile Phone: [phone] Relation: Daughter  Code Status:  DNR Goals of care: Advanced Directive information Advanced Directives 04/16/2021  Does Patient Have a Medical Advance Directive? Yes  Type of Paramedic of [place];Living will;Out of facility DNR (pink MOST or yellow form)  Does patient want to make changes to medical advance directive? No - Patient declined  Copy of [place] in Chart? Yes - validated most recent copy scanned in chart (See row information)  Would patient like information on creating a medical advance directive? -  Pre-existing out of facility DNR order (yellow form or pink MOST form) -     Chief Complaint  Patient presents with   Medical Management of Chronic Issues   Quality Metric Gaps    OPHTHALMOLOGY EXAM    HPI:  Pt is a 88 y.o. female seen today for medical management of chronic diseases.     Patient has a history of type 2 diabetes, hyperlipidemia, depression, dementia, hypothyroidism, osteopenia, stress and urge urinary incontinence, Tachycardia     She is stable. No new Nursing issues.  Does have issues in the evening. When she tries to leave the facility No Recent Falls Has lost 8 lbs since my last visit Wt Readings from Last 3 Encounters:  04/16/21 124 lb 8 oz (56.5 kg)  03/16/21 124 lb 1.6 oz (56.3 kg)  02/09/21 125 lb 8 oz (56.9 kg)   Wheelchair dependent.   Past Medical History:  Diagnosis Date   Hearing loss    History of fracture of clavicle    Hypothyroidism    Insomnia     Tinnitus    Past Surgical History:  Procedure Laterality Date   CHOLECYSTECTOMY  2007   Dr. Verdene Lennert   CLAVICLE EXCISION  1986   INTRAMEDULLARY (IM) NAIL INTERTROCHANTERIC Left 04/09/2020   Procedure: LEFT INTERTROCHANTERIC INTRAMEDULLARY (IM) NAIL;  Surgeon: Leandrew Koyanagi, MD;  Location: [facility];  Service: Orthopedics;  Laterality: Left;   MOHS SURGERY     past 10 years chest & legs   OPEN REDUCTION INTERNAL FIXATION (ORIF) DISTAL RADIAL FRACTURE Left 04/09/2020   Procedure: CLOSED REDUCTION LEFT DISTAL RADIUS FRACTURE;  Surgeon: Leandrew Koyanagi, MD;  Location: [facility];  Service: Orthopedics;  Laterality: Left;    Allergies  Allergen Reactions   Metformin And Related Diarrhea    Allergies as of 04/16/2021       Reactions   Metformin And Related Diarrhea        Medication List        Accurate as of April 16, 2021 10:57 AM. If you have any questions, ask your nurse or doctor.          acetaminophen 325 MG tablet Commonly known as: TYLENOL Take 650 mg by mouth 3 (three) times daily as needed.   atorvastatin 10 MG tablet Commonly known as: LIPITOR Take 10 mg by mouth daily.   CeraVe Baby 1 % Lotn Generic drug: DIMETHICONE (TOPICAL) Apply 1 application topically daily.   divalproex 125 MG DR tablet Commonly known as: DEPAKOTE Take  125 mg by mouth daily.   docusate sodium 100 MG capsule Commonly known as: COLACE Take 100 mg by mouth daily.   levothyroxine 75 MCG tablet Commonly known as: SYNTHROID TAKE 1 TABLET (75 MCG TOTAL) BY MOUTH DAILY BEFORE BREAKFAST.   LORazepam 0.5 MG tablet Commonly known as: ATIVAN Take 0.5 mg by mouth 2 (two) times daily as needed.   losartan 25 MG tablet Commonly known as: COZAAR Take 12.5 mg by mouth daily. Hold if SBP<110   metoprolol tartrate 25 MG tablet Commonly known as: LOPRESSOR Take 12.5 mg by mouth 2 (two) times daily.   ondansetron 4 MG tablet Commonly known as: Zofran Take 1 tablet (4 mg total) by mouth 2 (two)  times daily as needed for nausea or vomiting.   polyethylene glycol 17 g packet Commonly known as: MIRALAX / GLYCOLAX Take 17 g by mouth daily as needed for moderate constipation.   QUEtiapine 25 MG tablet Commonly known as: SEROQUEL Take 25 mg by mouth at bedtime.   sertraline 50 MG tablet Commonly known as: ZOLOFT Take 50 mg by mouth daily.   Vitamin D3 50 MCG (2000 UT) Tabs Take 1 tablet by mouth daily.   zinc oxide 20 % ointment Apply 1 application topically as needed for irritation.        Review of Systems  Constitutional:  Positive for unexpected weight change. Negative for activity change and appetite change.  HENT: Negative.    Respiratory:  Negative for cough and shortness of breath.   Cardiovascular:  Negative for leg swelling.  Gastrointestinal:  Negative for constipation.  Genitourinary: Negative.   Musculoskeletal:  Positive for gait problem. Negative for arthralgias and myalgias.  Skin: Negative.   Neurological:  Negative for dizziness and weakness.  Psychiatric/Behavioral:  Positive for behavioral problems and confusion. Negative for dysphoric mood and sleep disturbance.    Immunization History  Administered Date(s) Administered   DTaP 07/20/2013   Influenza, High Dose Seasonal PF 12/07/2016   Influenza,inj,Quad PF,6+ Mos 12/01/2017   Influenza-Unspecified 11/20/2014, 12/12/2015, 11/20/2019   Moderna SARS-COV2 Booster Vaccination 08/06/2020   Moderna Sars-Covid-2 Vaccination 03/05/2019, 04/02/2019   PFIZER(Purple Top)SARS-COV-2 Vaccination 04/01/2020, 08/06/2020, 11/19/2020   PPD Test 08/06/2014   Pneumococcal Conjugate-13 11/07/2013   Pneumococcal Polysaccharide-23 03/22/2017   Tetanus 03/02/2015   Zoster Recombinat (Shingrix) 06/07/2017, 08/26/2017   Zoster, Live 03/01/2006   Pertinent  Health Maintenance Due  Topic Date Due   OPHTHALMOLOGY EXAM  Never done   INFLUENZA VACCINE  05/29/2021 (Originally 09/29/2020)   HEMOGLOBIN A1C  07/09/2021    FOOT EXAM  07/23/2021   DEXA SCAN  Completed   Fall Risk 04/12/2020 04/13/2020 04/13/2020 04/14/2020 09/12/2020  Falls in the past year? - - - - 1  Was there an injury with Fall? - - - - 1  Fall Risk Category Calculator - - - - 3  Fall Risk Category - - - - High  Patient Fall Risk Level High fall risk High fall risk High fall risk High fall risk -  Patient at Risk for Falls Due to - - - - History of fall(s);Impaired balance/gait;Impaired mobility;Mental status change  Fall risk Follow up - - - - Falls evaluation completed;Education provided;Falls prevention discussed   Functional Status Survey:    Vitals:   04/16/21 1052  BP: (!) 143/86  Pulse: 99  Resp: 18  Temp: 97.8 F (36.6 C)  SpO2: 95%  Weight: 124 lb 8 oz (56.5 kg)  Height: 5' (1.524 m)   Body mass  index is 24.31 kg/m. Physical Exam Vitals reviewed.  Constitutional:      Appearance: Normal appearance.  HENT:     Head: Normocephalic.     Nose: Nose normal.     Mouth/Throat:     Mouth: Mucous membranes are moist.     Pharynx: Oropharynx is clear.  Eyes:     Pupils: Pupils are equal, round, and reactive to light.  Cardiovascular:     Rate and Rhythm: Normal rate and regular rhythm.     Pulses: Normal pulses.     Heart sounds: Normal heart sounds. No murmur heard. Pulmonary:     Effort: Pulmonary effort is normal.     Breath sounds: Normal breath sounds.  Abdominal:     General: Abdomen is flat. Bowel sounds are normal.     Palpations: Abdomen is soft.  Musculoskeletal:        General: No swelling.     Cervical back: Neck supple.  Skin:    General: Skin is warm.  Neurological:     General: No focal deficit present.     Mental Status: She is alert.  Psychiatric:        Mood and Affect: Mood normal.        Thought Content: Thought content normal.    Labs reviewed: Recent Labs    11/20/20 0000 11/21/20 0000 11/21/20 0110  NA 141 139 141  K 3.7 3.9 3.7  CL 105 105 105  CO2 26* 26* 26*  BUN 20 20 20    CREATININE 0.7 0.7 0.7  CALCIUM 8.4* 8.6* 8.4*   Recent Labs    11/20/20 0000 11/21/20 0000 11/21/20 0110  AST 13 15 13   ALT 8 9 8   ALKPHOS 65 63 65  ALBUMIN 3.2* 3.3* 3.2*   Recent Labs    07/17/20 0000 11/20/20 0000 11/21/20 0000 11/21/20 0110  WBC 9.0 7.1 6.8 7.1  NEUTROABS 5,517.00 5,730.00 4,821.00 5,730.00  HGB 11.3* 10.7* 10.6* 10.7*  HCT 36 34* 34* 34*  PLT 258  --  218 220   Lab Results  Component Value Date   TSH 0.69 11/21/2020   Lab Results  Component Value Date   HGBA1C 5.5 01/09/2021   Lab Results  Component Value Date   CHOL 96 11/21/2020   HDL 33 (A) 11/21/2020   LDLCALC 45 11/21/2020   TRIG 94 11/21/2020   CHOLHDL 3.0 12/13/2019    Significant Diagnostic Results in last 30 days:  No results found.  Assessment/Plan Vascular dementia with behavior disturbance On Depakote and Seroquel Has failed Aricept and Namenda  Primary hypertension BP runs on Lower side Low dose of Cozaar and Lopressor  Sinus tachycardia Low dose of Lopressor  Diabetes mellitus type 2 in nonobese [facility]) Off all her meds now Mixed hyperlipidemia On statin LDL 45 in 9/22 Acquired hypothyroidism TSH normal in 9/22 Depression with anxiety Continue low dose of Zoloft Weight loss ? Due to her worsening Dementia On Supplements Anemia,  HGB stable Will follow    Family/ staff Communication:   Labs/tests ordered:

## 2021-05-08 ENCOUNTER — Non-Acute Institutional Stay (SKILLED_NURSING_FACILITY): Payer: Medicare Other | Admitting: Orthopedic Surgery

## 2021-05-08 ENCOUNTER — Encounter: Payer: Self-pay | Admitting: Orthopedic Surgery

## 2021-05-08 DIAGNOSIS — Z66 Do not resuscitate: Secondary | ICD-10-CM | POA: Diagnosis not present

## 2021-05-08 DIAGNOSIS — K5901 Slow transit constipation: Secondary | ICD-10-CM | POA: Diagnosis not present

## 2021-05-08 DIAGNOSIS — F01518 Vascular dementia, unspecified severity, with other behavioral disturbance: Secondary | ICD-10-CM

## 2021-05-08 DIAGNOSIS — M25552 Pain in left hip: Secondary | ICD-10-CM | POA: Diagnosis not present

## 2021-05-08 DIAGNOSIS — I10 Essential (primary) hypertension: Secondary | ICD-10-CM

## 2021-05-08 DIAGNOSIS — E782 Mixed hyperlipidemia: Secondary | ICD-10-CM

## 2021-05-08 DIAGNOSIS — L853 Xerosis cutis: Secondary | ICD-10-CM | POA: Diagnosis not present

## 2021-05-08 DIAGNOSIS — R634 Abnormal weight loss: Secondary | ICD-10-CM

## 2021-05-08 DIAGNOSIS — E119 Type 2 diabetes mellitus without complications: Secondary | ICD-10-CM

## 2021-05-08 DIAGNOSIS — E039 Hypothyroidism, unspecified: Secondary | ICD-10-CM

## 2021-05-08 DIAGNOSIS — F418 Other specified anxiety disorders: Secondary | ICD-10-CM | POA: Diagnosis not present

## 2021-05-08 DIAGNOSIS — R058 Other specified cough: Secondary | ICD-10-CM | POA: Diagnosis not present

## 2021-05-08 NOTE — Progress Notes (Signed)
Location:  [facility] Room Number: N39/A Place of Service:  SNF [id number]) Provider: Yvonna Alanis, NP  Patient Care Team: [name], MD as PCP - General (Internal Medicine)  Extended Emergency Contact Information Primary Emergency Contact: Harris,Margaret Address: [place], [place] [address] [name] of [place] Phone: [phone] Mobile Phone: [phone] Relation: Daughter  Code Status:  DNR Goals of care: Advanced Directive information Advanced Directives 05/08/2021  Does Patient Have a Medical Advance Directive? Yes  Type of Paramedic of [place];Living will;Out of facility DNR (pink MOST or yellow form)  Does patient want to make changes to medical advance directive? No - Patient declined  Copy of [place] in Chart? Yes - validated most recent copy scanned in chart (See row information)  Would patient like information on creating a medical advance directive? -  Pre-existing out of facility DNR order (yellow form or pink MOST form) Yellow form placed in chart (order not valid for inpatient use)     Chief Complaint  Patient presents with   Medical Management of Chronic Issues    Routine visit. Discuss the need for eye exam, or post pone if patient refuses.     HPI:  Pt is a 88 y.o. female seen today for medical management of chronic diseases.    She currently resides on the skilled nursing unit at [facility]. Past medical includes: HTN, constipation, T2DM, hypothyroidism, OA, urinary retention, vascular dementia, depression and anxiety.   Productive cough- deep cough noted 03/09, CXR pending, O2 sats > 90% RA, covid negative 03/09, no other cold symptoms, afebrile Dementia- BIMS 5 09/2020, CT head 04/2020 age related atrophy and chronic small vessel ischemia, spending more time in bed, talking less, did not recognize daughter yesterday, possible skin picking to right shoulder,  ambulates mainly in wheelchair, remains on Depakote, Seroquel and Ativan HTN- BUN/creat 20/0.7 11/20/2020, remains on metoprolol and losartan T2DM- a1c 5.5 01/09/2021, blood sugars averaging 100-140's, not on medications at this time HLD- LDL 45 11/20/2020, remains on Lipitor Dry skin- improved with Cerave, 2 lesions to right shoulder- ? Skin picking Left hip pain- denies pain today, followed by palliative, 11/16 xray unremarkable, remains on tylenol prn Constipation- LBM 03/09, colace daily, remains on miralax prn Hypothyroidism- TSH 0.69 11/21/2020, remains on levothyroxine  Depression/anxiety- no mood changes or panic attacks, Zoloft daily, ativan prn Weight loss- down 3 lbs from last month, 15 lbs since 12/220, see weights below  No recent falls or injuries.   Recent blood pressures:  03/10- 128/76  03/09- 126/66  03/08- 136/75  Recent weights:  03/01- 121.3 lbs  02/01- 124.5 lbs  01/04- 123.8 lbs   Past Medical History:  Diagnosis Date   Hearing loss    History of fracture of clavicle    Hypothyroidism    Insomnia    Tinnitus    Past Surgical History:  Procedure Laterality Date   CHOLECYSTECTOMY  2007   Dr. Verdene Lennert   CLAVICLE EXCISION  1986   INTRAMEDULLARY (IM) NAIL INTERTROCHANTERIC Left 04/09/2020   Procedure: LEFT INTERTROCHANTERIC INTRAMEDULLARY (IM) NAIL;  Surgeon: Leandrew Koyanagi, MD;  Location: [facility];  Service: Orthopedics;  Laterality: Left;   MOHS SURGERY     past 10 years chest & legs   OPEN REDUCTION INTERNAL FIXATION (ORIF) DISTAL RADIAL FRACTURE Left 04/09/2020   Procedure: CLOSED REDUCTION LEFT DISTAL RADIUS FRACTURE;  Surgeon: Erlinda Hong,  Marylynn Pearson, MD;  Location: [facility];  Service: Orthopedics;  Laterality: Left;    Allergies  Allergen Reactions   Metformin And Related Diarrhea    Outpatient Encounter Medications as of 05/08/2021  Medication Sig   acetaminophen (TYLENOL) 325 MG tablet Take 650 mg by mouth 3 (three) times daily as needed.   atorvastatin  (LIPITOR) 10 MG tablet Take 10 mg by mouth daily.   Cholecalciferol (VITAMIN D3) 2000 units TABS Take 1 tablet by mouth daily.    DIMETHICONE, TOPICAL, (CERAVE BABY) 1 % LOTN Apply 1 application topically daily.   divalproex (DEPAKOTE) 125 MG DR tablet Take 125 mg by mouth daily.   docusate sodium (COLACE) 100 MG capsule Take 100 mg by mouth daily.   hydrocortisone cream 1 % Apply 1 application. topically 2 (two) times daily. Apply to back, abdomen, and chest   ipratropium-albuterol (DUONEB) 0.5-2.5 (3) MG/3ML SOLN Take 3 mLs by nebulization every 6 (six) hours as needed.   levothyroxine (SYNTHROID) 75 MCG tablet TAKE 1 TABLET (75 MCG TOTAL) BY MOUTH DAILY BEFORE BREAKFAST.   LORazepam (ATIVAN) 0.5 MG tablet Take 0.5 mg by mouth 2 (two) times daily as needed.   losartan (COZAAR) 25 MG tablet Take 12.5 mg by mouth daily. Hold if SBP<110   metoprolol tartrate (LOPRESSOR) 25 MG tablet Take 12.5 mg by mouth 2 (two) times daily.   ondansetron (ZOFRAN) 4 MG tablet Take 1 tablet (4 mg total) by mouth 2 (two) times daily as needed for nausea or vomiting.   polyethylene glycol (MIRALAX / GLYCOLAX) 17 g packet Take 17 g by mouth daily as needed for moderate constipation.   QUEtiapine (SEROQUEL) 25 MG tablet Take 25 mg by mouth at bedtime.   sertraline (ZOLOFT) 50 MG tablet Take 50 mg by mouth daily.   zinc oxide 20 % ointment Apply 1 application topically as needed for irritation.   No facility-administered encounter medications on file as of 05/08/2021.    Review of Systems  Unable to perform ROS: Dementia   Immunization History  Administered Date(s) Administered   DTaP 07/20/2013   Influenza, High Dose Seasonal PF 12/07/2016   Influenza,inj,Quad PF,6+ Mos 12/01/2017   Influenza-Unspecified 11/20/2014, 12/12/2015, 11/20/2019   Moderna SARS-COV2 Booster Vaccination 08/06/2020   Moderna Sars-Covid-2 Vaccination 03/05/2019, 04/02/2019   PFIZER(Purple Top)SARS-COV-2 Vaccination 04/01/2020,  08/06/2020, 11/19/2020   PPD Test 08/06/2014   Pneumococcal Conjugate-13 11/07/2013   Pneumococcal Polysaccharide-23 03/22/2017   Tetanus 03/02/2015   Zoster Recombinat (Shingrix) 06/07/2017, 08/26/2017   Zoster, Live 03/01/2006   Pertinent  Health Maintenance Due  Topic Date Due   OPHTHALMOLOGY EXAM  Never done   INFLUENZA VACCINE  05/29/2021 (Originally 09/29/2020)   HEMOGLOBIN A1C  07/09/2021   FOOT EXAM  07/23/2021   DEXA SCAN  Completed   Fall Risk 04/12/2020 04/13/2020 04/13/2020 04/14/2020 09/12/2020  Falls in the past year? - - - - 1  Was there an injury with Fall? - - - - 1  Fall Risk Category Calculator - - - - 3  Fall Risk Category - - - - High  Patient Fall Risk Level High fall risk High fall risk High fall risk High fall risk -  Patient at Risk for Falls Due to - - - - History of fall(s);Impaired balance/gait;Impaired mobility;Mental status change  Fall risk Follow up - - - - Falls evaluation completed;Education provided;Falls prevention discussed   Functional Status Survey:    Vitals:   05/08/21 0958  BP: 126/66  Pulse: 91  Resp:  18  Temp: 97.8 F (36.6 C)  SpO2: 94%  Weight: 121 lb 4.8 oz (55 kg)  Height: 5' (1.524 m)   Body mass index is 23.69 kg/m. Physical Exam Vitals reviewed.  Constitutional:      General: She is not in acute distress. HENT:     Head: Normocephalic.     Right Ear: There is no impacted cerumen.     Left Ear: There is no impacted cerumen.     Nose: Nose normal.     Mouth/Throat:     Mouth: Mucous membranes are moist.  Eyes:     General:        Right eye: No discharge.        Left eye: No discharge.  Neck:     Vascular: No carotid bruit.  Cardiovascular:     Rate and Rhythm: Normal rate and regular rhythm.     Pulses: Normal pulses.     Heart sounds: Normal heart sounds.  Pulmonary:     Effort: Pulmonary effort is normal. No respiratory distress.     Breath sounds: Examination of the right-middle field reveals decreased breath  sounds. Decreased breath sounds and rhonchi present. No wheezing.  Abdominal:     General: Bowel sounds are normal. There is no distension.     Palpations: Abdomen is soft.     Tenderness: There is no abdominal tenderness.  Musculoskeletal:     Cervical back: Neck supple.     Right lower leg: No edema.     Left lower leg: No edema.  Lymphadenopathy:     Cervical: No cervical adenopathy.  Skin:    General: Skin is warm and dry.     Capillary Refill: Capillary refill takes less than 2 seconds.     Comments: 2 pea sized lesions to right shoulder, CDI, no sign of infection, dry skin noted to upper back between scapulas  Neurological:     General: No focal deficit present.     Mental Status: She is alert.     Motor: Weakness present.     Gait: Gait abnormal.     Comments: wheelchair  Psychiatric:        Mood and Affect: Mood normal.        Behavior: Behavior normal.        Cognition and Memory: Memory is impaired.     Comments: Very pleasant follows, commands, alert to self    Labs reviewed: Recent Labs    11/20/20 0000 11/21/20 0000 11/21/20 0110  NA 141 139 141  K 3.7 3.9 3.7  CL 105 105 105  CO2 26* 26* 26*  BUN '20 20 20  '$ CREATININE 0.7 0.7 0.7  CALCIUM 8.4* 8.6* 8.4*   Recent Labs    11/20/20 0000 11/21/20 0000 11/21/20 0110  AST '13 15 13  '$ ALT '8 9 8  '$ ALKPHOS 65 63 65  ALBUMIN 3.2* 3.3* 3.2*   Recent Labs    07/17/20 0000 11/20/20 0000 11/21/20 0000 11/21/20 0110  WBC 9.0 7.1 6.8 7.1  NEUTROABS 5,517.00 5,730.00 4,821.00 5,730.00  HGB 11.3* 10.7* 10.6* 10.7*  HCT 36 34* 34* 34*  PLT 258  --  218 220   Lab Results  Component Value Date   TSH 0.69 11/21/2020   Lab Results  Component Value Date   HGBA1C 5.5 01/09/2021   Lab Results  Component Value Date   CHOL 96 11/21/2020   HDL 33 (A) 11/21/2020   LDLCALC 45 11/21/2020   TRIG 94  11/21/2020   CHOLHDL 3.0 12/13/2019    Significant Diagnostic Results in last 30 days:  No results  found.  Assessment/Plan 1. Do not resuscitate  2. Productive cough - rhonchi / diminished right base on exam, afebrile - CXR-pending - start doxycycline 100 mg po bid x 7 days - start mucinex 600 mg po bid x 10 days  3. Vascular dementia with behavior disturbance - no behavioral outbursts - less talkative, unable to recognize daughter - ambulates with wheelchair - cont skilled nursing care - cont Depakote, Seroquel and Ativan  4. Primary hypertension - controlled - cont losartan - cbc/diff - cmp  5. Diabetes mellitus type 2 in nonobese (HCC) - A1c stable, sugars 110-140's - off medication at this time - A1c  6. Mixed hyperlipidemia - LDL stable - cont Lipitor - Lipid panel  7. Dry skin - 2 pea sized skin lesions to right shoulder- ? Skin picking - dryness noted to upper back- improved - start Cerave QOD to upper back, chest, shoulders  8. Left hip pain - followed by palliative - cont tylenol for pain  9. Slow transit constipation - LBM 03/09, abdomen soft - cont colace and miralax prn  10. Acquired hypothyroidism - TSH stable - cont levothyroxine - TSH  11. Depression with anxiety - no mood changes - cont Zoloft  12. Weight loss - down 3 lbs from last month, 15 lbs from 12/2020 - cont monthly weights    Family/ staff Communication: plan discussed with patient, daughter and nurse  Labs/tests ordered:  CXR-pending/ cbc/diff, cmp, A1c, TSH, lipid panel

## 2021-05-11 DIAGNOSIS — E782 Mixed hyperlipidemia: Secondary | ICD-10-CM | POA: Diagnosis not present

## 2021-05-11 DIAGNOSIS — I10 Essential (primary) hypertension: Secondary | ICD-10-CM | POA: Diagnosis not present

## 2021-05-11 DIAGNOSIS — F01518 Vascular dementia, unspecified severity, with other behavioral disturbance: Secondary | ICD-10-CM | POA: Diagnosis not present

## 2021-05-11 DIAGNOSIS — E119 Type 2 diabetes mellitus without complications: Secondary | ICD-10-CM | POA: Diagnosis not present

## 2021-05-11 DIAGNOSIS — E039 Hypothyroidism, unspecified: Secondary | ICD-10-CM | POA: Diagnosis not present

## 2021-05-11 DIAGNOSIS — E785 Hyperlipidemia, unspecified: Secondary | ICD-10-CM | POA: Diagnosis not present

## 2021-05-11 LAB — HEMOGLOBIN A1C: Hemoglobin A1C: 5.5

## 2021-05-11 LAB — BASIC METABOLIC PANEL
BUN: 20 (ref 4–21)
CO2: 27 — AB (ref 13–22)
Chloride: 107 (ref 99–108)
Creatinine: 0.5 (ref 0.5–1.1)
Glucose: 91
Potassium: 4 mEq/L (ref 3.5–5.1)
Sodium: 142 (ref 137–147)

## 2021-05-11 LAB — CBC AND DIFFERENTIAL
HCT: 34 — AB (ref 36–46)
Hemoglobin: 10.6 — AB (ref 12.0–16.0)
Neutrophils Absolute: 5262
Platelets: 301 10*3/uL (ref 150–400)
WBC: 8.8

## 2021-05-11 LAB — COMPREHENSIVE METABOLIC PANEL
Albumin: 3.2 — AB (ref 3.5–5.0)
Calcium: 8.8 (ref 8.7–10.7)
Globulin: 2.5
eGFR: 88

## 2021-05-11 LAB — HEPATIC FUNCTION PANEL
ALT: 8 U/L (ref 7–35)
AST: 10 — AB (ref 13–35)
Alkaline Phosphatase: 76 (ref 25–125)
Bilirubin, Total: 0.3

## 2021-05-11 LAB — CBC: RBC: 4.1 (ref 3.87–5.11)

## 2021-05-26 DIAGNOSIS — Z993 Dependence on wheelchair: Secondary | ICD-10-CM | POA: Diagnosis not present

## 2021-05-26 DIAGNOSIS — R296 Repeated falls: Secondary | ICD-10-CM | POA: Diagnosis not present

## 2021-05-26 DIAGNOSIS — S72002D Fracture of unspecified part of neck of left femur, subsequent encounter for closed fracture with routine healing: Secondary | ICD-10-CM | POA: Diagnosis not present

## 2021-05-26 DIAGNOSIS — R2681 Unsteadiness on feet: Secondary | ICD-10-CM | POA: Diagnosis not present

## 2021-05-26 DIAGNOSIS — F0280 Dementia in other diseases classified elsewhere without behavioral disturbance: Secondary | ICD-10-CM | POA: Diagnosis not present

## 2021-05-26 DIAGNOSIS — Z9181 History of falling: Secondary | ICD-10-CM | POA: Diagnosis not present

## 2021-05-27 DIAGNOSIS — Z993 Dependence on wheelchair: Secondary | ICD-10-CM | POA: Diagnosis not present

## 2021-05-27 DIAGNOSIS — S72002D Fracture of unspecified part of neck of left femur, subsequent encounter for closed fracture with routine healing: Secondary | ICD-10-CM | POA: Diagnosis not present

## 2021-05-27 DIAGNOSIS — F0280 Dementia in other diseases classified elsewhere without behavioral disturbance: Secondary | ICD-10-CM | POA: Diagnosis not present

## 2021-05-27 DIAGNOSIS — Z9181 History of falling: Secondary | ICD-10-CM | POA: Diagnosis not present

## 2021-05-27 DIAGNOSIS — R296 Repeated falls: Secondary | ICD-10-CM | POA: Diagnosis not present

## 2021-05-27 DIAGNOSIS — R2681 Unsteadiness on feet: Secondary | ICD-10-CM | POA: Diagnosis not present

## 2021-05-29 DIAGNOSIS — R296 Repeated falls: Secondary | ICD-10-CM | POA: Diagnosis not present

## 2021-05-29 DIAGNOSIS — S72002D Fracture of unspecified part of neck of left femur, subsequent encounter for closed fracture with routine healing: Secondary | ICD-10-CM | POA: Diagnosis not present

## 2021-05-29 DIAGNOSIS — F0280 Dementia in other diseases classified elsewhere without behavioral disturbance: Secondary | ICD-10-CM | POA: Diagnosis not present

## 2021-05-29 DIAGNOSIS — Z9181 History of falling: Secondary | ICD-10-CM | POA: Diagnosis not present

## 2021-05-29 DIAGNOSIS — R2681 Unsteadiness on feet: Secondary | ICD-10-CM | POA: Diagnosis not present

## 2021-05-29 DIAGNOSIS — Z993 Dependence on wheelchair: Secondary | ICD-10-CM | POA: Diagnosis not present

## 2021-06-02 DIAGNOSIS — R296 Repeated falls: Secondary | ICD-10-CM | POA: Diagnosis not present

## 2021-06-02 DIAGNOSIS — F0280 Dementia in other diseases classified elsewhere without behavioral disturbance: Secondary | ICD-10-CM | POA: Diagnosis not present

## 2021-06-02 DIAGNOSIS — R2681 Unsteadiness on feet: Secondary | ICD-10-CM | POA: Diagnosis not present

## 2021-06-02 DIAGNOSIS — Z9181 History of falling: Secondary | ICD-10-CM | POA: Diagnosis not present

## 2021-06-02 DIAGNOSIS — S72002D Fracture of unspecified part of neck of left femur, subsequent encounter for closed fracture with routine healing: Secondary | ICD-10-CM | POA: Diagnosis not present

## 2021-06-02 DIAGNOSIS — Z993 Dependence on wheelchair: Secondary | ICD-10-CM | POA: Diagnosis not present

## 2021-06-10 ENCOUNTER — Other Ambulatory Visit: Payer: Self-pay

## 2021-06-10 MED ORDER — LORAZEPAM 0.5 MG PO TABS: 0.5000 mg | ORAL_TABLET | Freq: Two times a day (BID) | ORAL | 5 refills | Status: DC | PRN

## 2021-06-19 ENCOUNTER — Non-Acute Institutional Stay (SKILLED_NURSING_FACILITY): Payer: Medicare Other | Admitting: Orthopedic Surgery

## 2021-06-19 ENCOUNTER — Encounter: Payer: Self-pay | Admitting: Orthopedic Surgery

## 2021-06-19 DIAGNOSIS — R634 Abnormal weight loss: Secondary | ICD-10-CM

## 2021-06-19 DIAGNOSIS — E782 Mixed hyperlipidemia: Secondary | ICD-10-CM

## 2021-06-19 DIAGNOSIS — E039 Hypothyroidism, unspecified: Secondary | ICD-10-CM | POA: Diagnosis not present

## 2021-06-19 DIAGNOSIS — E119 Type 2 diabetes mellitus without complications: Secondary | ICD-10-CM | POA: Diagnosis not present

## 2021-06-19 DIAGNOSIS — F418 Other specified anxiety disorders: Secondary | ICD-10-CM

## 2021-06-19 DIAGNOSIS — F01518 Vascular dementia, unspecified severity, with other behavioral disturbance: Secondary | ICD-10-CM

## 2021-06-19 DIAGNOSIS — I10 Essential (primary) hypertension: Secondary | ICD-10-CM | POA: Diagnosis not present

## 2021-06-19 DIAGNOSIS — K5901 Slow transit constipation: Secondary | ICD-10-CM

## 2021-06-19 NOTE — Progress Notes (Signed)
?Location:  [facility] Room Number: N39/A ?Place of Service:  SNF (31) ?Provider: Yvonna Alanis, NP ? ? ?Patient Care Team: ?[name], MD as PCP - General (Internal Medicine) ? ?Extended Emergency Contact Information ?Primary Emergency Contact: Harris,Margaret ?Address: [address] ?         [place], [place] [address] [place] ?Home Phone: [phone] ?Mobile Phone: [phone] ?Relation: Daughter ? ?Code Status:  DNR ?Goals of care: Advanced Directive information ? ?  06/19/2021  ?  9:21 AM  ?Advanced Directives  ?Does Patient Have a Medical Advance Directive? Yes  ?Type of Paramedic of [place];Living will;Out of facility DNR (pink MOST or yellow form)  ?Does patient want to make changes to medical advance directive? No - Patient declined  ?Copy of [place] in Chart? Yes - validated most recent copy scanned in chart (See row information)  ?Pre-existing out of facility DNR order (yellow form or pink MOST form) Yellow form placed in chart (order not valid for inpatient use)  ? ? ? ?Chief Complaint  ?Patient presents with  ? Medical Management of Chronic Issues  ?  Routine visit.  ? Quality Metric Gaps  ?  Discuss the need for eye exam, or post pone if patient refuses.   ? ? ?HPI:  ?Pt is a 88 y.o. female seen today for medical management of chronic diseases.   ? ?She currently resides on the skilled nursing unit at [facility]. Past medical includes: HTN, constipation, T2DM, hypothyroidism, OA, urinary retention, vascular dementia, depression and anxiety.  ? ?Daughter present during encounter. ? ?Dementia- followed by palliative, BIMS 3 05/05/2021, was 5 09/2020, CT head 04/2020 age related atrophy and chronic small vessel ischemia, less talkative, some agitation in late afternoon, remains on Depakote, Seroquel and Ativan ?Weight loss- see trends below, remains on Boost daily ?HTN- BUN/creat 20/0.5 05/11/2021, remains on  metoprolol and losartan ?T2DM- a1c 5.5 05/11/2021, blood sugars averaging 100-120's, remains on carb mod diet ?HLD- LDL 45 11/20/2020, remains on Lipitor ?Constipation- LBM 04/19, colace daily, remains on miralax prn ?Hypothyroidism- TSH 0.69 11/21/2020, remains on levothyroxine  ?Depression/anxiety- no mood changes or panic attacks, good family support- visit often, remains on Zoloft  ? ?No recent falls or injuries. Ambulates with wheelchair. Observed out of her room often.  ? ?Recent blood pressures: ? 04/21- 150/72 ? 04/20- 129/75 ? 04/19- 134/72 ? ?Recent weights: ? 04/04- 121.9 lbs ? 03/01- 121.3 lbs ? 02/01- 124.5 lbs ? ? ? ?Past Medical History:  ?Diagnosis Date  ? Hearing loss   ? History of fracture of clavicle   ? Hypothyroidism   ? Insomnia   ? Tinnitus   ? ?Past Surgical History:  ?Procedure Laterality Date  ? CHOLECYSTECTOMY  2007  ? Dr. Verdene Lennert  ? [place] [place]  ? INTRAMEDULLARY (IM) NAIL INTERTROCHANTERIC Left 04/09/2020  ? Procedure: LEFT INTERTROCHANTERIC INTRAMEDULLARY (IM) NAIL;  Surgeon: Leandrew Koyanagi, MD;  Location: [facility];  Service: Orthopedics;  Laterality: Left;  ? MOHS SURGERY    ? past 10 years chest & legs  ? OPEN REDUCTION INTERNAL FIXATION (ORIF) DISTAL RADIAL FRACTURE Left 04/09/2020  ? Procedure: CLOSED REDUCTION LEFT DISTAL RADIUS FRACTURE;  Surgeon: Leandrew Koyanagi, MD;  Location: [facility];  Service: Orthopedics;  Laterality: Left;  ? ? ?Allergies  ?Allergen Reactions  ? Metformin And Related Diarrhea  ? ? ?Outpatient Encounter Medications as of 06/19/2021  ?Medication Sig  ?  acetaminophen (TYLENOL) 325 MG tablet Take 650 mg by mouth 3 (three) times daily as needed.  ? atorvastatin (LIPITOR) 10 MG tablet Take 10 mg by mouth daily.  ? Cholecalciferol (VITAMIN D3) 2000 units TABS Take 1 tablet by mouth daily.   ? DIMETHICONE, TOPICAL, (CERAVE BABY) 1 % LOTN Apply 1 application topically daily.  ? divalproex (DEPAKOTE) 125 MG DR tablet Take 125 mg by mouth daily.  ? docusate sodium  (COLACE) 100 MG capsule Take 100 mg by mouth daily.  ? Emollient (CERAVE) LOTN Apply topically every other day. Apply to shoulders, back, and chest  ? hydrocortisone cream 1 % Apply 1 application. topically 2 (two) times daily. Apply to back, abdomen, and chest  ? ipratropium-albuterol (DUONEB) 0.5-2.5 (3) MG/3ML SOLN Take 3 mLs by nebulization every 6 (six) hours as needed.  ? levothyroxine (SYNTHROID) 75 MCG tablet TAKE 1 TABLET (75 MCG TOTAL) BY MOUTH DAILY BEFORE BREAKFAST.  ? LORazepam (ATIVAN) 0.5 MG tablet Take 1 tablet (0.5 mg total) by mouth 2 (two) times daily as needed.  ? losartan (COZAAR) 25 MG tablet Take 12.5 mg by mouth daily. Hold if SBP<110  ? metoprolol tartrate (LOPRESSOR) 25 MG tablet Take 12.5 mg by mouth 2 (two) times daily.  ? ondansetron (ZOFRAN) 4 MG tablet Take 1 tablet (4 mg total) by mouth 2 (two) times daily as needed for nausea or vomiting.  ? polyethylene glycol (MIRALAX / GLYCOLAX) 17 g packet Take 17 g by mouth daily as needed for moderate constipation.  ? QUEtiapine (SEROQUEL) 25 MG tablet Take 25 mg by mouth at bedtime.  ? sertraline (ZOLOFT) 50 MG tablet Take 50 mg by mouth daily.  ? zinc oxide 20 % ointment Apply 1 application topically as needed for irritation.  ? ?No facility-administered encounter medications on file as of 06/19/2021.  ? ? ?Review of Systems  ?Unable to perform ROS: Dementia  ? ?Immunization History  ?Administered Date(s) Administered  ? DTaP 07/20/2013  ? Influenza, High Dose Seasonal PF 12/07/2016  ? Influenza,inj,Quad PF,6+ Mos 12/01/2017  ? Influenza-Unspecified 11/20/2014, 12/12/2015, 11/20/2019  ? Moderna SARS-COV2 Booster Vaccination 08/06/2020  ? Moderna Sars-Covid-2 Vaccination 03/05/2019, 04/02/2019  ? PFIZER(Purple Top)SARS-COV-2 Vaccination 04/01/2020, 08/06/2020, 11/19/2020  ? PPD Test 08/06/2014  ? Pneumococcal Conjugate-13 11/07/2013  ? Pneumococcal Polysaccharide-23 03/22/2017  ? Tetanus 03/02/2015  ? Zoster Recombinat (Shingrix) 06/07/2017,  08/26/2017  ? Zoster, Live 03/01/2006  ? ?Pertinent  Health Maintenance Due  ?Topic Date Due  ? OPHTHALMOLOGY EXAM  Never done  ? FOOT EXAM  07/23/2021  ? INFLUENZA VACCINE  09/29/2021  ? HEMOGLOBIN A1C  11/11/2021  ? DEXA SCAN  Completed  ? ? ?  04/12/2020  ?  9:00 PM 04/13/2020  ?  8:56 AM 04/13/2020  ?  8:00 PM 04/14/2020  ?  7:47 AM 09/12/2020  ? 12:07 PM  ?Fall Risk  ?Falls in the past year?     1  ?Was there an injury with Fall?     1  ?Fall Risk Category Calculator     3  ?Fall Risk Category     High  ?Patient Fall Risk Level High fall risk High fall risk High fall risk High fall risk   ?Patient at Risk for Falls Due to     History of fall(s);Impaired balance/gait;Impaired mobility;Mental status change  ?Fall risk Follow up     Falls evaluation completed;Education provided;Falls prevention discussed  ? ?Functional Status Survey: ?  ? ?Vitals:  ? 06/19/21 0916  ?BP: 129/75  ?  Pulse: 76  ?Resp: 20  ?Temp: (!) 97.4 ?F (36.3 ?C)  ?SpO2: 96%  ?Weight: 121 lb 14.4 oz (55.3 kg)  ?Height: 5' (1.524 m)  ? ?Body mass index is 23.81 kg/m?Marland Kitchen ?Physical Exam ?Vitals reviewed.  ?Constitutional:   ?   General: She is not in acute distress. ?HENT:  ?   Head: Normocephalic.  ?   Nose: Nose normal.  ?   Mouth/Throat:  ?   Mouth: Mucous membranes are moist.  ?Eyes:  ?   General:     ?   Right eye: No discharge.     ?   Left eye: No discharge.  ?Neck:  ?   Vascular: No carotid bruit.  ?Cardiovascular:  ?   Rate and Rhythm: Normal rate and regular rhythm.  ?   Pulses: Normal pulses.  ?   Heart sounds: Normal heart sounds.  ?Pulmonary:  ?   Effort: Pulmonary effort is normal. No respiratory distress.  ?   Breath sounds: Normal breath sounds. No wheezing.  ?Abdominal:  ?   General: Bowel sounds are normal. There is no distension.  ?   Palpations: Abdomen is soft.  ?   Tenderness: There is no abdominal tenderness.  ?Musculoskeletal:  ?   Cervical back: Neck supple.  ?   Right lower leg: No edema.  ?   Left lower leg: No edema.   ?Lymphadenopathy:  ?   Cervical: No cervical adenopathy.  ?Skin: ?   General: Skin is warm.  ?   Capillary Refill: Capillary refill takes less than 2 seconds.  ?   Findings: No rash.  ?Neurological:  ?   General: No focal defi

## 2021-07-02 DIAGNOSIS — Z9181 History of falling: Secondary | ICD-10-CM | POA: Diagnosis not present

## 2021-07-21 ENCOUNTER — Encounter: Payer: Self-pay | Admitting: Internal Medicine

## 2021-07-21 ENCOUNTER — Non-Acute Institutional Stay (SKILLED_NURSING_FACILITY): Payer: Medicare Other | Admitting: Internal Medicine

## 2021-07-21 DIAGNOSIS — D649 Anemia, unspecified: Secondary | ICD-10-CM | POA: Diagnosis not present

## 2021-07-21 DIAGNOSIS — F418 Other specified anxiety disorders: Secondary | ICD-10-CM | POA: Diagnosis not present

## 2021-07-21 DIAGNOSIS — E782 Mixed hyperlipidemia: Secondary | ICD-10-CM

## 2021-07-21 DIAGNOSIS — E039 Hypothyroidism, unspecified: Secondary | ICD-10-CM

## 2021-07-21 DIAGNOSIS — I10 Essential (primary) hypertension: Secondary | ICD-10-CM | POA: Diagnosis not present

## 2021-07-21 DIAGNOSIS — F01518 Vascular dementia, unspecified severity, with other behavioral disturbance: Secondary | ICD-10-CM | POA: Diagnosis not present

## 2021-07-21 DIAGNOSIS — E119 Type 2 diabetes mellitus without complications: Secondary | ICD-10-CM | POA: Diagnosis not present

## 2021-07-21 NOTE — Progress Notes (Signed)
Location:   [facility] Room Number: 39 Place of Service:  SNF [id number]) Provider:  Veleta Miners MD  [name], MD  Patient Care Team: [name], MD as PCP - General (Internal Medicine)  Extended Emergency Contact Information Primary Emergency Contact: Harris,Margaret Address: [address]          [place], [place] [address] [name] of [place] Phone: [phone] Mobile Phone: [phone] Relation: Daughter  Code Status:  DNR Palliative Care Goals of care: Advanced Directive information    07/21/2021   10:15 AM  Advanced Directives  Does Patient Have a Medical Advance Directive? Yes  Type of Paramedic of [place];Living will;Out of facility DNR (pink MOST or yellow form)  Does patient want to make changes to medical advance directive? No - Patient declined  Copy of [place] in Chart? Yes - validated most recent copy scanned in chart (See row information)  Pre-existing out of facility DNR order (yellow form or pink MOST form) Yellow form placed in chart (order not valid for inpatient use)     Chief Complaint  Patient presents with   Quality Metric Gaps    Eye exam   Medical Management of Chronic Issues    HPI:  Pt is a 88 y.o. female seen today for medical management of chronic diseases.    [name] Resident of SNF   Patient has a history of type 2 diabetes, hyperlipidemia, depression, dementia, hypothyroidism, osteopenia, stress and urge urinary incontinence, Tachycardia  h/o Hip fracture and Compression fracture after falls    She is stable. No new Nursing issues.  Still sometimes gets agitated and Responds to PRN ativan Stays in her Wheelchair Continues to have falls when she tries to get up from bed without help Needs help with her transfers No help with feeding   Wt Readings from Last 3 Encounters:  07/21/21 126 lb 1.6 oz (57.2 kg)  06/19/21 121 lb 14.4 oz (55.3 kg)   05/08/21 121 lb 4.8 oz (55 kg)   Past Medical History:  Diagnosis Date   Hearing loss    History of fracture of clavicle    Hypothyroidism    Insomnia    Tinnitus    Past Surgical History:  Procedure Laterality Date   CHOLECYSTECTOMY  2007   Dr. Verdene Lennert   CLAVICLE EXCISION  1986   INTRAMEDULLARY (IM) NAIL INTERTROCHANTERIC Left 04/09/2020   Procedure: LEFT INTERTROCHANTERIC INTRAMEDULLARY (IM) NAIL;  Surgeon: Leandrew Koyanagi, MD;  Location: [facility];  Service: Orthopedics;  Laterality: Left;   MOHS SURGERY     past 10 years chest & legs   OPEN REDUCTION INTERNAL FIXATION (ORIF) DISTAL RADIAL FRACTURE Left 04/09/2020   Procedure: CLOSED REDUCTION LEFT DISTAL RADIUS FRACTURE;  Surgeon: Leandrew Koyanagi, MD;  Location: [facility];  Service: Orthopedics;  Laterality: Left;    Allergies  Allergen Reactions   Metformin And Related Diarrhea    Allergies as of 07/21/2021       Reactions   Metformin And Related Diarrhea        Medication List        Accurate as of Jul 21, 2021 10:15 AM. If you have any questions, ask your nurse or doctor.          acetaminophen 325 MG tablet Commonly known as: TYLENOL Take 650 mg by mouth 3 (three) times daily as needed.   atorvastatin 10 MG tablet Commonly known as: LIPITOR Take 10  mg by mouth daily.   CeraVe Baby 1 % Lotn Generic drug: DIMETHICONE (TOPICAL) Apply 1 application topically daily.   CeraVe Lotn Apply topically every other day. Apply to shoulders, back, and chest   divalproex 125 MG DR tablet Commonly known as: DEPAKOTE Take 125 mg by mouth daily.   docusate sodium 100 MG capsule Commonly known as: COLACE Take 100 mg by mouth daily.   hydrocortisone cream 1 % Apply 1 application. topically 2 (two) times daily. Apply to back, abdomen, and chest   ipratropium-albuterol 0.5-2.5 (3) MG/3ML Soln Commonly known as: DUONEB Take 3 mLs by nebulization every 6 (six) hours as needed.   levothyroxine 75 MCG tablet Commonly known  as: SYNTHROID TAKE 1 TABLET (75 MCG TOTAL) BY MOUTH DAILY BEFORE BREAKFAST.   LORazepam 0.5 MG tablet Commonly known as: ATIVAN Take 1 tablet (0.5 mg total) by mouth 2 (two) times daily as needed.   losartan 25 MG tablet Commonly known as: COZAAR Take 12.5 mg by mouth daily. Hold if SBP<110   metoprolol tartrate 25 MG tablet Commonly known as: LOPRESSOR Take 12.5 mg by mouth 2 (two) times daily.   ondansetron 4 MG tablet Commonly known as: Zofran Take 1 tablet (4 mg total) by mouth 2 (two) times daily as needed for nausea or vomiting.   polyethylene glycol 17 g packet Commonly known as: MIRALAX / GLYCOLAX Take 17 g by mouth daily as needed for moderate constipation.   QUEtiapine 25 MG tablet Commonly known as: SEROQUEL Take 25 mg by mouth at bedtime.   sertraline 50 MG tablet Commonly known as: ZOLOFT Take 50 mg by mouth daily.   Vitamin D3 50 MCG (2000 UT) Tabs Take 1 tablet by mouth daily.   zinc oxide 20 % ointment Apply 1 application topically as needed for irritation.        Review of Systems  Unable to perform ROS: Dementia   Immunization History  Administered Date(s) Administered   DTaP 07/20/2013   Influenza, High Dose Seasonal PF 12/07/2016   Influenza,inj,Quad PF,6+ Mos 12/01/2017   Influenza-Unspecified 11/20/2014, 12/12/2015, 11/20/2019   Moderna SARS-COV2 Booster Vaccination 08/06/2020   Moderna Sars-Covid-2 Vaccination 03/05/2019, 04/02/2019   PFIZER(Purple Top)SARS-COV-2 Vaccination 04/01/2020, 08/06/2020, 11/19/2020   PPD Test 08/06/2014   Pneumococcal Conjugate-13 11/07/2013   Pneumococcal Polysaccharide-23 03/22/2017   Tetanus 03/02/2015   Zoster Recombinat (Shingrix) 06/07/2017, 08/26/2017   Zoster, Live 03/01/2006   Pertinent  Health Maintenance Due  Topic Date Due   OPHTHALMOLOGY EXAM  Never done   FOOT EXAM  07/23/2021   INFLUENZA VACCINE  09/29/2021   HEMOGLOBIN A1C  11/11/2021   DEXA SCAN  Completed      04/12/2020    9:00  PM 04/13/2020    8:56 AM 04/13/2020    8:00 PM 04/14/2020    7:47 AM 09/12/2020   12:07 PM  Fall Risk  Falls in the past year?     1  Was there an injury with Fall?     1  Fall Risk Category Calculator     3  Fall Risk Category     High  Patient Fall Risk Level High fall risk High fall risk High fall risk High fall risk   Patient at Risk for Falls Due to     History of fall(s);Impaired balance/gait;Impaired mobility;Mental status change  Fall risk Follow up     Falls evaluation completed;Education provided;Falls prevention discussed   Functional Status Survey:    Vitals:   07/21/21 1010  BP: 133/65  Pulse: 90  Resp: 20  Temp: (!) 96.3 F (35.7 C)  SpO2: 98%  Weight: 126 lb 1.6 oz (57.2 kg)  Height: 5' (1.524 m)   Body mass index is 24.63 kg/m. Physical Exam Vitals reviewed.  Constitutional:      Appearance: Normal appearance.  HENT:     Head: Normocephalic.     Nose: Nose normal.     Mouth/Throat:     Mouth: Mucous membranes are moist.     Pharynx: Oropharynx is clear.  Eyes:     Pupils: Pupils are equal, round, and reactive to light.  Cardiovascular:     Rate and Rhythm: Normal rate and regular rhythm.     Pulses: Normal pulses.     Heart sounds: Normal heart sounds. No murmur heard. Pulmonary:     Effort: Pulmonary effort is normal.     Breath sounds: Normal breath sounds.  Abdominal:     General: Abdomen is flat. Bowel sounds are normal.     Palpations: Abdomen is soft.  Musculoskeletal:        General: No swelling.     Cervical back: Neck supple.  Skin:    General: Skin is warm.  Neurological:     General: No focal deficit present.     Mental Status: She is alert.  Psychiatric:        Mood and Affect: Mood normal.        Thought Content: Thought content normal.    Labs reviewed: Recent Labs    11/21/20 0000 11/21/20 0110 05/11/21 0000  NA 139 141 142  K 3.9 3.7 4.0  CL 105 105 107  CO2 26* 26* 27*  BUN '20 20 20  '$ CREATININE 0.7 0.7 0.5   CALCIUM 8.6* 8.4* 8.8   Recent Labs    11/21/20 0000 11/21/20 0110 05/11/21 0000  AST 15 13 10*  ALT '9 8 8  '$ ALKPHOS 63 65 76  ALBUMIN 3.3* 3.2* 3.2*   Recent Labs    11/21/20 0000 11/21/20 0110 05/11/21 0000  WBC 6.8 7.1 8.8  NEUTROABS 4,821.00 5,730.00 5,262.00  HGB 10.6* 10.7* 10.6*  HCT 34* 34* 34*  PLT 218 220 301   Lab Results  Component Value Date   TSH 0.69 11/21/2020   Lab Results  Component Value Date   HGBA1C 5.5 05/11/2021   Lab Results  Component Value Date   CHOL 96 11/21/2020   HDL 33 (A) 11/21/2020   LDLCALC 45 11/21/2020   TRIG 94 11/21/2020   CHOLHDL 3.0 12/13/2019    Significant Diagnostic Results in last 30 days:  No results found.  Assessment/Plan 1. Vascular dementia with behavior disturbance ([place]) Supportive care in SNF  2. Primary hypertension On Losartan low dose  3. Diabetes mellitus type 2 in nonobese [facility]) Off all her meds now  4. Mixed hyperlipidemia Continue statin LDL 45 in 09/22  5. Acquired hypothyroidism TSH normal in 09/22  6. Depression with anxiety Low dose of Zoloft  7. Anemia, unspecified type HGb low but stable     Family/ staff Communication:   Labs/tests ordered:

## 2021-08-19 ENCOUNTER — Non-Acute Institutional Stay (SKILLED_NURSING_FACILITY): Payer: Medicare Other | Admitting: Orthopedic Surgery

## 2021-08-19 ENCOUNTER — Encounter: Payer: Self-pay | Admitting: Orthopedic Surgery

## 2021-08-19 DIAGNOSIS — R634 Abnormal weight loss: Secondary | ICD-10-CM

## 2021-08-19 DIAGNOSIS — F01518 Vascular dementia, unspecified severity, with other behavioral disturbance: Secondary | ICD-10-CM

## 2021-08-19 DIAGNOSIS — E119 Type 2 diabetes mellitus without complications: Secondary | ICD-10-CM

## 2021-08-19 DIAGNOSIS — I10 Essential (primary) hypertension: Secondary | ICD-10-CM

## 2021-08-19 DIAGNOSIS — F418 Other specified anxiety disorders: Secondary | ICD-10-CM | POA: Diagnosis not present

## 2021-08-19 DIAGNOSIS — E039 Hypothyroidism, unspecified: Secondary | ICD-10-CM

## 2021-08-19 DIAGNOSIS — E782 Mixed hyperlipidemia: Secondary | ICD-10-CM | POA: Diagnosis not present

## 2021-08-19 DIAGNOSIS — K5901 Slow transit constipation: Secondary | ICD-10-CM | POA: Diagnosis not present

## 2021-08-19 NOTE — Progress Notes (Signed)
Location:   [place] Room Number: 39 Place of Service:  SNF ([phone]) Provider:  Yvonna Alanis, NP    [name], MD  Patient Care Team: [name], MD as PCP - General (Internal Medicine)  Extended Emergency Contact Information Primary Emergency Contact: Harris,Margaret Address: [place], [place] [address] [name] of [place] Phone: [phone] Mobile Phone: [phone] Relation: Daughter  Code Status:   DNR Palliative Care Goals of care: Advanced Directive information    08/19/2021    9:22 AM  Advanced Directives  Does Patient Have a Medical Advance Directive? Yes  Type of Paramedic of [place];Living will;Out of facility DNR (pink MOST or yellow form)  Does patient want to make changes to medical advance directive? No - Patient declined  Copy of [place] in Chart? Yes - validated most recent copy scanned in chart (See row information)  Pre-existing out of facility DNR order (yellow form or pink MOST form) Yellow form placed in chart (order not valid for inpatient use)     Chief Complaint  Patient presents with   Medical Management of Chronic Issues   Quality Metric Gaps    Verified [facility] and [facility] patient is due for:  OPHTHALMOLOGY EXAM (Yearly)   COVID-19 Vaccine (6 - Moderna series) FOOT EXAM       HPI:  Pt is a 88 y.o. female seen today for medical management of chronic diseases.    She currently resides on the skilled nursing unit at [facility] Past medical includes: HTN, constipation, T2DM, hypothyroidism, OA, urinary retention, vascular dementia, depression and anxiety.    Daughter present during encounter.  T2DM- a1c 5.5 05/11/2021, blood sugars averaging 100-130's, daughter reports she is drinking more sweet tea and eating more sweets- plan to recheck A1c, remains on carb mod diet Dementia- followed by palliative, BIMS 6/15 07/30/2021, was 3/15  05/05/2021, CT head 04/2020 age related atrophy and chronic small vessel ischemia, sleeping more, some agitation in late afternoon, remains on Depakote, Seroquel and Ativan Weight loss- see trends below, up 6 lbs since 05/2021, remains on Boost daily HTN- BUN/creat 20/0.5 05/11/2021, remains on metoprolol and losartan HLD- LDL 45 11/20/2020, remains on Lipitor Hypothyroidism- TSH 0.69 11/21/2020, remains on levothyroxine  Depression/anxiety- no mood changes or panic attacks, good family support, remains on Zoloft, Na+ 142 05/11/2021 Constipation- LBM 06/19, remains on colace and miralax  06/20- fall without injury, slipped out of bed. Continues to ambulate with wheelchair.   Recent blood pressures:  06/21- 135/72, 122/58  06/20- 111/56, 131/72  Recent weights:  06/01- 127 lbs  05/01- 126.1 lbs  04/04- 121.9 lbs    Past Medical History:  Diagnosis Date   Hearing loss    History of fracture of clavicle    Hypothyroidism    Insomnia    Tinnitus    Past Surgical History:  Procedure Laterality Date   CHOLECYSTECTOMY  2007   Dr. Verdene Lennert   CLAVICLE EXCISION  1986   INTRAMEDULLARY (IM) NAIL INTERTROCHANTERIC Left 04/09/2020   Procedure: LEFT INTERTROCHANTERIC INTRAMEDULLARY (IM) NAIL;  Surgeon: Leandrew Koyanagi, MD;  Location: [facility];  Service: Orthopedics;  Laterality: Left;   MOHS SURGERY     past 10 years chest & legs   OPEN REDUCTION INTERNAL FIXATION (ORIF) DISTAL RADIAL FRACTURE Left 04/09/2020   Procedure: CLOSED REDUCTION LEFT DISTAL RADIUS FRACTURE;  Surgeon: Leandrew Koyanagi, MD;  Location: [facility];  Service: Orthopedics;  Laterality: Left;    Allergies  Allergen Reactions   Metformin And Related Diarrhea    Allergies as of 08/19/2021       Reactions   Metformin And Related Diarrhea        Medication List        Accurate as of August 19, 2021  9:22 AM. If you have any questions, ask your nurse or doctor.          acetaminophen 325 MG tablet Commonly known as:  TYLENOL Take 650 mg by mouth 3 (three) times daily as needed.   atorvastatin 10 MG tablet Commonly known as: LIPITOR Take 10 mg by mouth daily.   CeraVe Baby 1 % Lotn Generic drug: DIMETHICONE (TOPICAL) Apply 1 application topically daily.   CeraVe Lotn Apply topically every other day. Apply to shoulders, back, and chest   divalproex 125 MG DR tablet Commonly known as: DEPAKOTE Take 125 mg by mouth daily.   docusate sodium 100 MG capsule Commonly known as: COLACE Take 100 mg by mouth daily.   hydrocortisone cream 1 % Apply 1 application. topically 2 (two) times daily. Apply to back, abdomen, and chest   ipratropium-albuterol 0.5-2.5 (3) MG/3ML Soln Commonly known as: DUONEB Take 3 mLs by nebulization every 6 (six) hours as needed.   levothyroxine 75 MCG tablet Commonly known as: SYNTHROID TAKE 1 TABLET (75 MCG TOTAL) BY MOUTH DAILY BEFORE BREAKFAST.   LORazepam 0.5 MG tablet Commonly known as: ATIVAN Take 1 tablet (0.5 mg total) by mouth 2 (two) times daily as needed.   losartan 25 MG tablet Commonly known as: COZAAR Take 12.5 mg by mouth daily. Hold if SBP<110   metoprolol tartrate 25 MG tablet Commonly known as: LOPRESSOR Take 12.5 mg by mouth 2 (two) times daily.   ondansetron 4 MG tablet Commonly known as: Zofran Take 1 tablet (4 mg total) by mouth 2 (two) times daily as needed for nausea or vomiting.   polyethylene glycol 17 g packet Commonly known as: MIRALAX / GLYCOLAX Take 17 g by mouth daily as needed for moderate constipation.   QUEtiapine 25 MG tablet Commonly known as: SEROQUEL Take 25 mg by mouth at bedtime.   sertraline 50 MG tablet Commonly known as: ZOLOFT Take 50 mg by mouth daily.   Vitamin D3 50 MCG (2000 UT) Tabs Take 1 tablet by mouth daily.   zinc oxide 20 % ointment Apply 1 application topically as needed for irritation.        Review of Systems  Unable to perform ROS: Dementia    Immunization History  Administered  Date(s) Administered   DTaP 07/20/2013   Influenza, High Dose Seasonal PF 12/07/2016   Influenza,inj,Quad PF,6+ Mos 12/01/2017   Influenza-Unspecified 11/20/2014, 12/12/2015, 11/20/2019   Moderna SARS-COV2 Booster Vaccination 08/06/2020   Moderna Sars-Covid-2 Vaccination 03/05/2019, 04/02/2019   PFIZER(Purple Top)SARS-COV-2 Vaccination 04/01/2020, 08/06/2020, 11/19/2020   PPD Test 08/06/2014   Pneumococcal Conjugate-13 11/07/2013   Pneumococcal Polysaccharide-23 03/22/2017   Tetanus 03/02/2015   Zoster Recombinat (Shingrix) 06/07/2017, 08/26/2017   Zoster, Live 03/01/2006   Pertinent  Health Maintenance Due  Topic Date Due   OPHTHALMOLOGY EXAM  Never done   FOOT EXAM  07/23/2021   INFLUENZA VACCINE  09/29/2021   HEMOGLOBIN A1C  11/11/2021   DEXA SCAN  Completed      04/12/2020    9:00 PM 04/13/2020    8:56 AM 04/13/2020    8:00 PM 04/14/2020  7:47 AM 09/12/2020   12:07 PM  Fall Risk  Falls in the past year?     1  Was there an injury with Fall?     1  Fall Risk Category Calculator     3  Fall Risk Category     High  Patient Fall Risk Level High fall risk High fall risk High fall risk High fall risk   Patient at Risk for Falls Due to     History of fall(s);Impaired balance/gait;Impaired mobility;Mental status change  Fall risk Follow up     Falls evaluation completed;Education provided;Falls prevention discussed   Functional Status Survey:    Vitals:   08/19/21 0918  BP: 135/72  Pulse: 82  Resp: (!) 22  Temp: 97.8 F (36.6 C)  SpO2: 95%  Weight: 127 lb (57.6 kg)  Height: 5' (1.524 m)   Body mass index is 24.8 kg/m. Physical Exam Vitals reviewed.  Constitutional:      General: She is not in acute distress. HENT:     Head: Normocephalic and atraumatic.     Right Ear: There is no impacted cerumen.     Left Ear: There is no impacted cerumen.     Nose: Nose normal.     Mouth/Throat:     Mouth: Mucous membranes are moist.  Eyes:     General:        Right eye:  No discharge.        Left eye: No discharge.  Cardiovascular:     Rate and Rhythm: Normal rate and regular rhythm.     Pulses:          Dorsalis pedis pulses are 1+ on the right side and 1+ on the left side.       Posterior tibial pulses are 1+ on the right side and 1+ on the left side.     Heart sounds: Normal heart sounds.  Pulmonary:     Effort: Pulmonary effort is normal. No respiratory distress.     Breath sounds: Normal breath sounds. No wheezing.  Abdominal:     General: Bowel sounds are normal. There is no distension.     Palpations: Abdomen is soft.     Tenderness: There is no abdominal tenderness.  Musculoskeletal:     Cervical back: Neck supple.     Right lower leg: No edema.     Left lower leg: No edema.     Comments: Able to move extremities without difficulty  Feet:     Right foot:     Toenail Condition: Right toenails are long.     Left foot:     Toenail Condition: Left toenails are long.  Skin:    General: Skin is warm and dry.     Capillary Refill: Capillary refill takes less than 2 seconds.  Neurological:     General: No focal deficit present.     Mental Status: She is alert. Mental status is at baseline.     Motor: Weakness present.     Gait: Gait abnormal.     Comments: wheelchair  Psychiatric:        Mood and Affect: Mood normal.        Behavior: Behavior normal.        Cognition and Memory: Cognition is impaired. Memory is impaired.     Comments: Very pleasant, follows commands, alert to self and familiar faces     Labs reviewed: Recent Labs    11/21/20 0000 11/21/20 0110 05/11/21 0000  NA 139 141 142  K 3.9 3.7 4.0  CL 105 105 107  CO2 26* 26* 27*  BUN '20 20 20  '$ CREATININE 0.7 0.7 0.5  CALCIUM 8.6* 8.4* 8.8   Recent Labs    11/21/20 0000 11/21/20 0110 05/11/21 0000  AST 15 13 10*  ALT '9 8 8  '$ ALKPHOS 63 65 76  ALBUMIN 3.3* 3.2* 3.2*   Recent Labs    11/21/20 0000 11/21/20 0110 05/11/21 0000  WBC 6.8 7.1 8.8  NEUTROABS  4,821.00 5,730.00 5,262.00  HGB 10.6* 10.7* 10.6*  HCT 34* 34* 34*  PLT 218 220 301   Lab Results  Component Value Date   TSH 0.69 11/21/2020   Lab Results  Component Value Date   HGBA1C 5.5 05/11/2021   Lab Results  Component Value Date   CHOL 96 11/21/2020   HDL 33 (A) 11/21/2020   LDLCALC 45 11/21/2020   TRIG 94 11/21/2020   CHOLHDL 3.0 12/13/2019    Significant Diagnostic Results in last 30 days:  No results found.  Assessment/Plan 1. Diabetes mellitus type 2 in nonobese (HCC) - a1c 5.5 05/11/2021, blood sugars 100-130's - daughter reports drinking more sweet tea and eating sweets - recheck A1c due to weight gain and changes in diet - cont carb mod diet - Diabetic foot exam next month - discuss yearly eye exam with daughter next visit  2. Vascular dementia with behavior disturbance (HCC) - intermittent sundowning in evening - sleeping more, less talkative - ambulates with wheelchair - cont Depakote, Seroquel and ativan prn  3. Weight loss - weight up 6 lbs in 2 months - cont Boost  - cont monthly weights  4. Primary hypertension - controlled  - cont losartan and metoprolol  5. Mixed hyperlipidemia - LDL stable - cont Lipitor  6. Acquired hypothyroidism - TSH stable - cont levothyroxine  7. Depression with anxiety - no mood changes - cont Zoloft  8. Slow transit constipation - LBM 06/19, abdomen soft - cont colace and miralax    Family/ staff Communication: plan discussed with patient and nurse  Labs/tests ordered:  A1c 08/20/2021

## 2021-08-20 DIAGNOSIS — E119 Type 2 diabetes mellitus without complications: Secondary | ICD-10-CM | POA: Diagnosis not present

## 2021-08-21 LAB — HEMOGLOBIN A1C: Hemoglobin A1C: 5.6

## 2021-08-25 ENCOUNTER — Other Ambulatory Visit: Payer: Self-pay | Admitting: Adult Health

## 2021-08-25 MED ORDER — LORAZEPAM 0.5 MG PO TABS
0.5000 mg | ORAL_TABLET | Freq: Two times a day (BID) | ORAL | 0 refills | Status: DC | PRN
Start: 2021-08-25 — End: 2021-10-02

## 2021-08-30 IMAGING — CR DG ANKLE COMPLETE 3+V*L*
3 series · 3 of 3 positions shown · non-contrast
Comparison: None.

CLINICAL DATA: Pain

EXAM:
LEFT ANKLE COMPLETE - 3+ VIEW

[ankle ap]
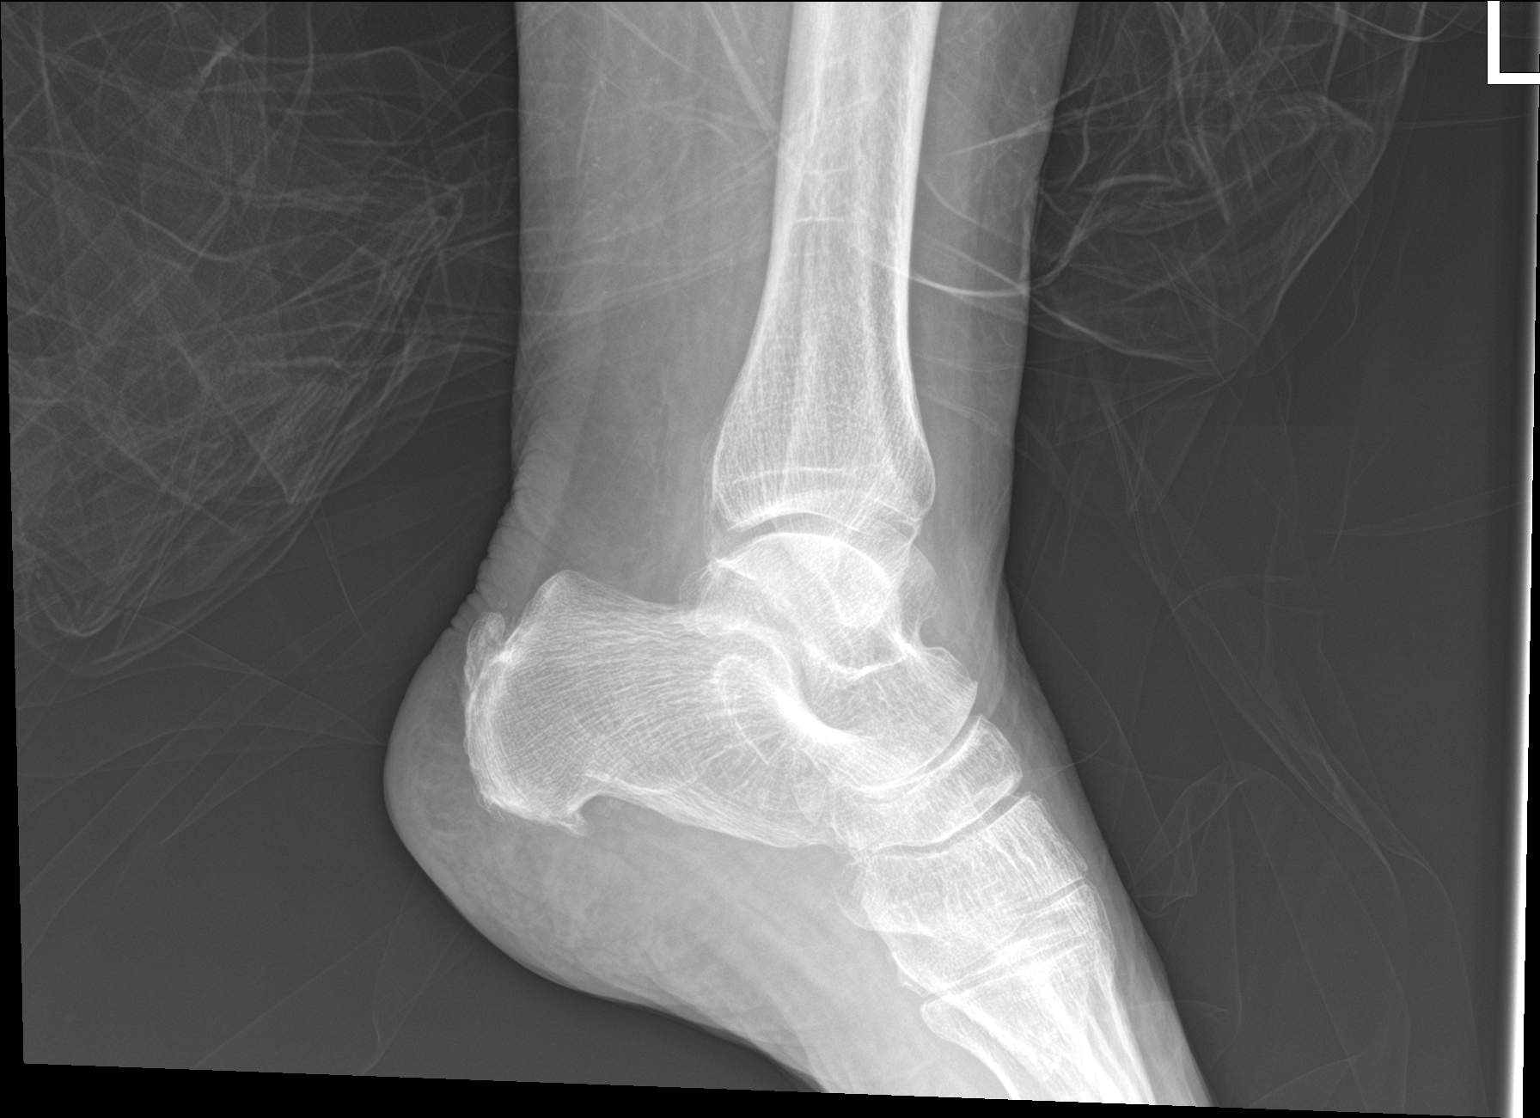

[ankle obl]
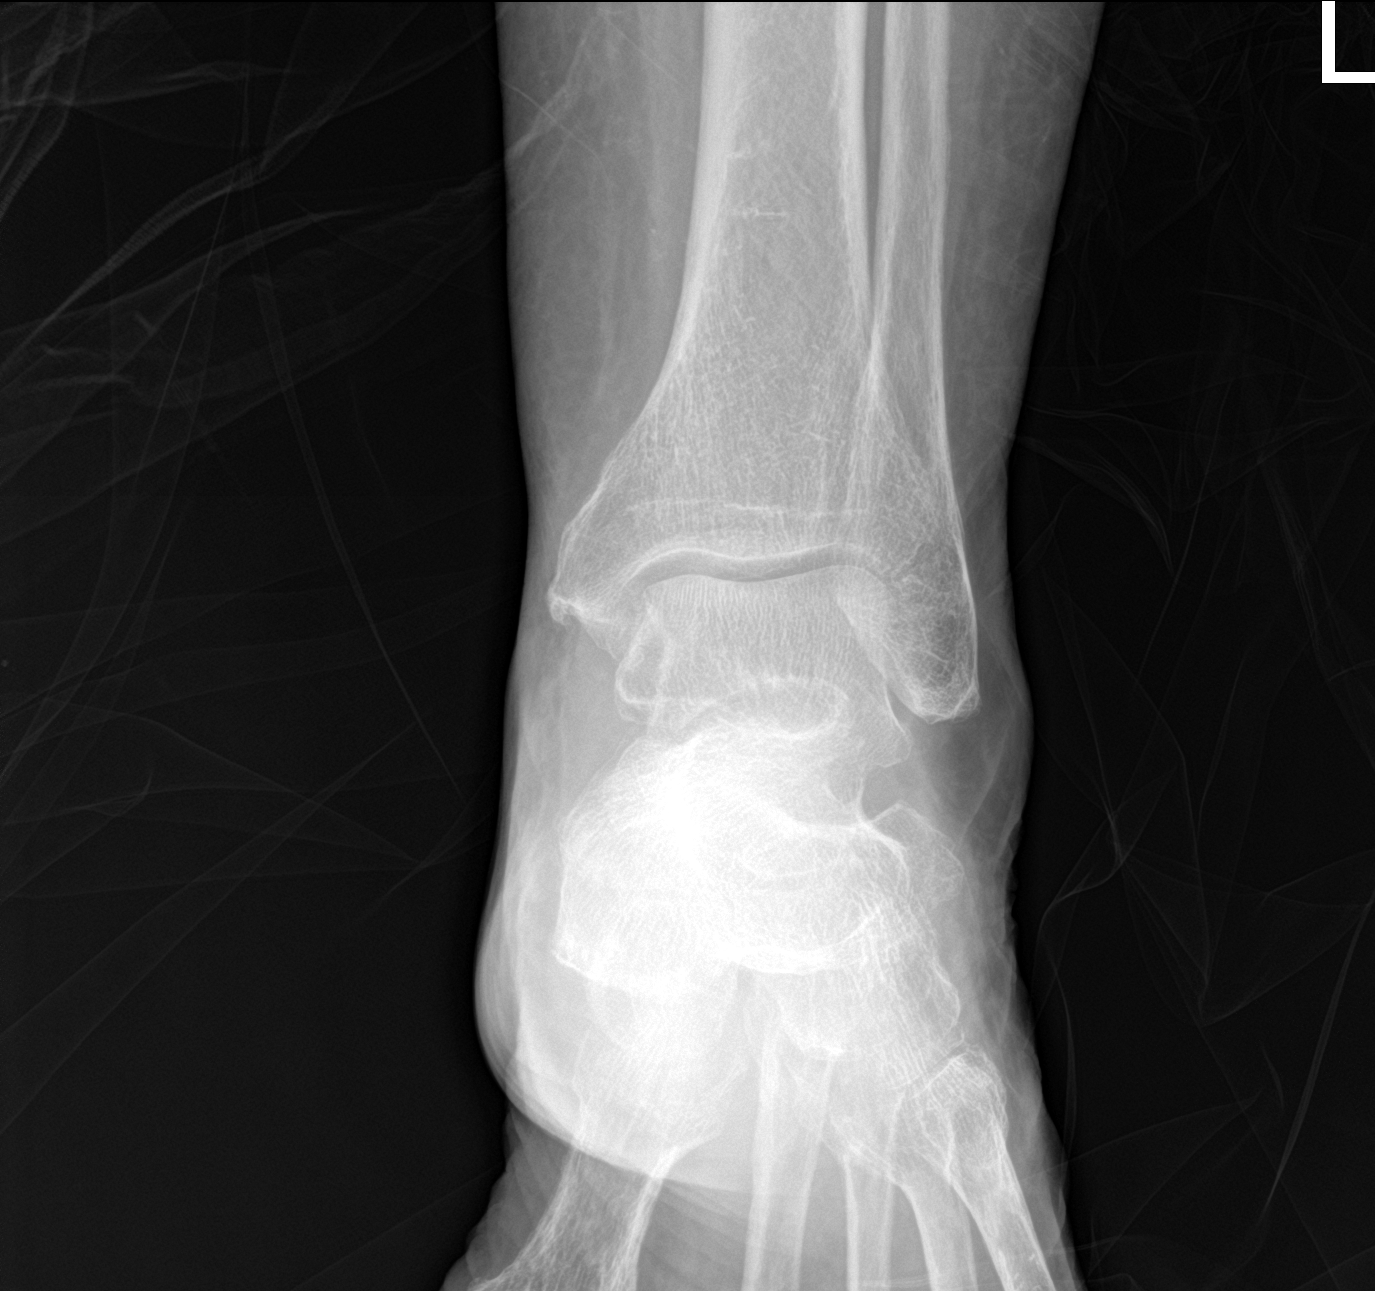

[ankle lat]
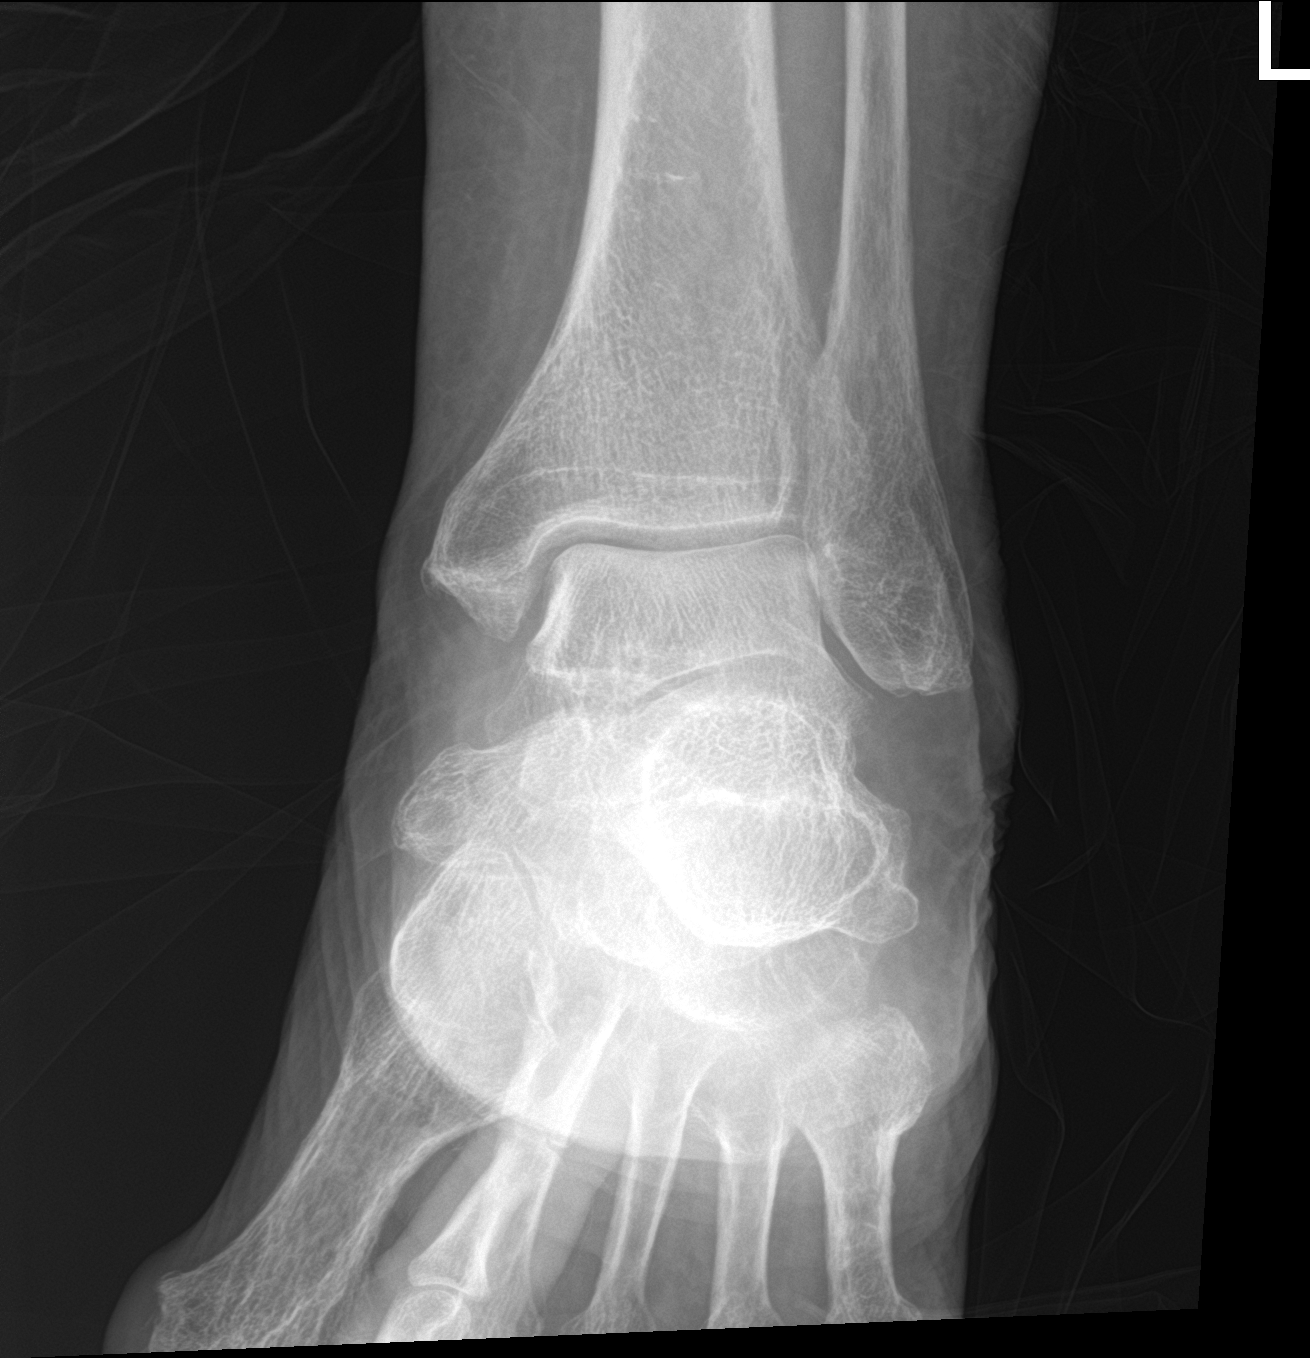

[3 of 3 positions shown; findings below may reference images not displayed]

FINDINGS: There is no evidence of fracture, dislocation, or joint effusion.
There is no evidence of arthropathy or other focal bone abnormality.
Soft tissues are unremarkable.
IMPRESSION: Negative.

## 2021-08-31 ENCOUNTER — Non-Acute Institutional Stay: Payer: Medicare Other | Admitting: *Deleted

## 2021-08-31 DIAGNOSIS — Z515 Encounter for palliative care: Secondary | ICD-10-CM

## 2021-08-31 NOTE — Progress Notes (Addendum)
[facility] RN NOTE  PATIENT NAME: Lisa Castillo DOB: [birth year] MRN: [id number]  PRIMARY CARE PROVIDER: [name], MD  RESPONSIBLE PARTY: Curt Bears (daughter) Acct ID - Guarantor Home Phone Work Phone Relationship Acct Type  [id number] TITA, [phone]  Self P/F     [address], [place], [place] [address]   RN follow up palliative care visit completed with patient today at [facility] Met with patient in the hallway.  Pain: Patient denies pain. No physical indicators of pain noted.  Cognitive: Patient confused most of the time. She is able to answer some simple questions. She does have exit seeking behaviors with some agitation at times especially as the evening approaches. Today she is calm and pleasant. Currently takes Depakote and Seroquel and has PRN Ativan available if needed  Mobility: She is able to propel herself in her wheelchair around the unit. She requires 1 person assistance with ADLs. She is able to feed herself independently. Recent fall out of bed a week ago without injury. Bed kept in low position with blue mats on the floor.   Appetite: Intake is variable but adequate. She has gained 8 lbs over the past 6 months. Current weight is 131.7 lbs. Her blood sugars are checked once weekly on Mondays. Ranges between 103-139. CBG today was 115.  Palliative care will continue to follow.   PHYSICAL EXAM:   LUNGS: clear to auscultation  CARDIAC: Cor RRR EXTREMITIES: No edema SKIN:  Exposed skin is dry and intact   NEURO:  Confused, agitated at times, propels self in wheelchair, HOH   (Duration of visit and documentation 30 minutes)   Daryl Eastern, RN BSN

## 2021-09-22 ENCOUNTER — Non-Acute Institutional Stay (SKILLED_NURSING_FACILITY): Payer: Medicare Other | Admitting: Orthopedic Surgery

## 2021-09-22 ENCOUNTER — Encounter: Payer: Self-pay | Admitting: Orthopedic Surgery

## 2021-09-22 DIAGNOSIS — I10 Essential (primary) hypertension: Secondary | ICD-10-CM | POA: Diagnosis not present

## 2021-09-22 DIAGNOSIS — F01518 Vascular dementia, unspecified severity, with other behavioral disturbance: Secondary | ICD-10-CM | POA: Diagnosis not present

## 2021-09-22 DIAGNOSIS — E039 Hypothyroidism, unspecified: Secondary | ICD-10-CM | POA: Diagnosis not present

## 2021-09-22 DIAGNOSIS — F418 Other specified anxiety disorders: Secondary | ICD-10-CM

## 2021-09-22 DIAGNOSIS — R7303 Prediabetes: Secondary | ICD-10-CM | POA: Diagnosis not present

## 2021-09-22 DIAGNOSIS — K5901 Slow transit constipation: Secondary | ICD-10-CM

## 2021-09-22 DIAGNOSIS — R634 Abnormal weight loss: Secondary | ICD-10-CM | POA: Diagnosis not present

## 2021-09-22 DIAGNOSIS — E782 Mixed hyperlipidemia: Secondary | ICD-10-CM | POA: Diagnosis not present

## 2021-09-22 NOTE — Progress Notes (Unsigned)
Location:   [facility] Room Number: 39 Place of Service:  SNF ([phone]) Provider:  Windell Moulding, NP  [name], MD  Patient Care Team: [name], MD as PCP - General (Internal Medicine)  Extended Emergency Contact Information Primary Emergency Contact: Harris,Margaret Address: [address]          [place], [place] [address] [name] of [place] Phone: [phone] Mobile Phone: [phone] Relation: Daughter  Code Status:  DNR Palliative Care Goals of care: Advanced Directive information    09/22/2021    4:01 PM  Advanced Directives  Does Patient Have a Medical Advance Directive? Yes  Type of Paramedic of [place];Living will;Out of facility DNR (pink MOST or yellow form)  Does patient want to make changes to medical advance directive? No - Patient declined  Copy of [place] in Chart? Yes - validated most recent copy scanned in chart (See row information)  Pre-existing out of facility DNR order (yellow form or pink MOST form) Yellow form placed in chart (order not valid for inpatient use)     Chief Complaint  Patient presents with   Medical Management of Chronic Issues   Quality Metric Gaps    Discuss need for foot and eye exam.    HPI:  Pt is a 88 y.o. female seen today for medical management of chronic diseases.    She currently resides on the skilled nursing unit at [facility]. Past medical includes: HTN, constipation, T2DM, hypothyroidism, OA, urinary retention, vascular dementia, depression and anxiety.    Daughter present during encounter.  Weight loss- see trends below, up 4 lbs in past month, enjoying more sugary drinks per daughter, remains on Boost daily  T2DM- a1c 5.6 08/21/2021, blood sugars averaging 100-130's, no hypoglycemic events, remains on carb mod diet Dementia- followed by palliative, BIMS 6/15 07/30/2021, was 3/15 05/05/2021, CT head 04/2020 age related atrophy and  chronic small vessel ischemia, sleeping more, some agitation in late afternoon, remains on Depakote, Seroquel and Ativan HTN- BUN/creat 20/0.5 05/11/2021, remains on metoprolol and losartan HLD- LDL 45 11/20/2020, remains on Lipitor Hypothyroidism- TSH 0.69 11/21/2020, remains on levothyroxine  Depression/anxiety- no mood changes or panic attacks, good family support, remains on Zoloft, Na+ 142 05/11/2021 Constipation- LBM 07/25, remains on colace and miralax  06/29 fall reported, no apparent injury. Ambulates with wheelchair.   Recent blood pressures:  07/26- 127/76  07/25- 128/62, 123/72  07/24- 124/72  Recent weights:  07/01- 131.7 lbs  06/01- 127 lbs  05/01- 126.1 lbs    Past Medical History:  Diagnosis Date   Hearing loss    History of fracture of clavicle    Hypothyroidism    Insomnia    Tinnitus    Past Surgical History:  Procedure Laterality Date   CHOLECYSTECTOMY  2007   Dr. Verdene Lennert   CLAVICLE EXCISION  1986   INTRAMEDULLARY (IM) NAIL INTERTROCHANTERIC Left 04/09/2020   Procedure: LEFT INTERTROCHANTERIC INTRAMEDULLARY (IM) NAIL;  Surgeon: Leandrew Koyanagi, MD;  Location: [facility];  Service: Orthopedics;  Laterality: Left;   MOHS SURGERY     past 10 years chest & legs   OPEN REDUCTION INTERNAL FIXATION (ORIF) DISTAL RADIAL FRACTURE Left 04/09/2020   Procedure: CLOSED REDUCTION LEFT DISTAL RADIUS FRACTURE;  Surgeon: Leandrew Koyanagi, MD;  Location: [facility];  Service: Orthopedics;  Laterality: Left;    Allergies  Allergen Reactions   Metformin And Related Diarrhea    Allergies as of  09/22/2021       Reactions   Metformin And Related Diarrhea        Medication List        Accurate as of September 22, 2021  4:01 PM. If you have any questions, ask your nurse or doctor.          acetaminophen 325 MG tablet Commonly known as: TYLENOL Take 650 mg by mouth 3 (three) times daily as needed.   atorvastatin 10 MG tablet Commonly known as: LIPITOR Take 10 mg by mouth  daily.   CeraVe Baby 1 % Lotn Generic drug: DIMETHICONE (TOPICAL) Apply 1 application topically daily.   CeraVe Lotn Apply topically every other day. Apply to shoulders, back, and chest   divalproex 125 MG DR tablet Commonly known as: DEPAKOTE Take 125 mg by mouth daily.   docusate sodium 100 MG capsule Commonly known as: COLACE Take 100 mg by mouth daily.   hydrocortisone cream 1 % Apply 1 application. topically 2 (two) times daily. Apply to back, abdomen, and chest   ipratropium-albuterol 0.5-2.5 (3) MG/3ML Soln Commonly known as: DUONEB Take 3 mLs by nebulization every 6 (six) hours as needed.   levothyroxine 75 MCG tablet Commonly known as: SYNTHROID TAKE 1 TABLET (75 MCG TOTAL) BY MOUTH DAILY BEFORE BREAKFAST.   LORazepam 0.5 MG tablet Commonly known as: ATIVAN Take 1 tablet (0.5 mg total) by mouth 2 (two) times daily as needed.   losartan 25 MG tablet Commonly known as: COZAAR Take 12.5 mg by mouth daily. Hold if SBP<110   metoprolol tartrate 25 MG tablet Commonly known as: LOPRESSOR Take 12.5 mg by mouth 2 (two) times daily.   ondansetron 4 MG tablet Commonly known as: Zofran Take 1 tablet (4 mg total) by mouth 2 (two) times daily as needed for nausea or vomiting.   polyethylene glycol 17 g packet Commonly known as: MIRALAX / GLYCOLAX Take 17 g by mouth daily as needed for moderate constipation.   QUEtiapine 25 MG tablet Commonly known as: SEROQUEL Take 25 mg by mouth at bedtime.   sertraline 50 MG tablet Commonly known as: ZOLOFT Take 50 mg by mouth daily.   Vitamin D3 50 MCG (2000 UT) Tabs Take 1 tablet by mouth daily.   zinc oxide 20 % ointment Apply 1 application topically as needed for irritation.        Review of Systems  Unable to perform ROS: Dementia    Immunization History  Administered Date(s) Administered   DTaP 07/20/2013   Influenza, High Dose Seasonal PF 12/07/2016   Influenza,inj,Quad PF,6+ Mos 12/01/2017    Influenza-Unspecified 11/20/2014, 12/12/2015, 11/20/2019   Moderna SARS-COV2 Booster Vaccination 08/06/2020   Moderna Sars-Covid-2 Vaccination 03/05/2019, 04/02/2019   PFIZER(Purple Top)SARS-COV-2 Vaccination 04/01/2020, 08/06/2020, 11/19/2020   PPD Test 08/06/2014   Pneumococcal Conjugate-13 11/07/2013   Pneumococcal Polysaccharide-23 03/22/2017   Tetanus 03/02/2015   Zoster Recombinat (Shingrix) 06/07/2017, 08/26/2017   Zoster, Live 03/01/2006   Pertinent  Health Maintenance Due  Topic Date Due   OPHTHALMOLOGY EXAM  Never done   FOOT EXAM  07/23/2021   INFLUENZA VACCINE  09/29/2021   HEMOGLOBIN A1C  02/20/2022   DEXA SCAN  Completed      04/12/2020    9:00 PM 04/13/2020    8:56 AM 04/13/2020    8:00 PM 04/14/2020    7:47 AM 09/12/2020   12:07 PM  Fall Risk  Falls in the past year?     1  Was there an injury with Fall?  1  Fall Risk Category Calculator     3  Fall Risk Category     High  Patient Fall Risk Level High fall risk High fall risk High fall risk High fall risk   Patient at Risk for Falls Due to     History of fall(s);Impaired balance/gait;Impaired mobility;Mental status change  Fall risk Follow up     Falls evaluation completed;Education provided;Falls prevention discussed   Functional Status Survey:    Vitals:   09/22/21 1557  BP: 123/72  Pulse: 89  Resp: 18  Temp: (!) 96.9 F (36.1 C)  SpO2: 96%  Weight: 131 lb 11.2 oz (59.7 kg)  Height: 5' (1.524 m)   Body mass index is 25.72 kg/m. Physical Exam Vitals reviewed.  Constitutional:      General: She is not in acute distress. HENT:     Head: Normocephalic.     Nose: Rhinorrhea present.  Eyes:     General:        Right eye: No discharge.        Left eye: No discharge.  Cardiovascular:     Rate and Rhythm: Normal rate and regular rhythm.     Pulses: Normal pulses.     Heart sounds: Normal heart sounds.  Pulmonary:     Effort: Pulmonary effort is normal. No respiratory distress.     Breath  sounds: Normal breath sounds. No wheezing.  Abdominal:     General: Bowel sounds are normal. There is no distension.     Palpations: Abdomen is soft.     Tenderness: There is no abdominal tenderness.  Musculoskeletal:     Cervical back: Neck supple.     Right lower leg: No edema.     Left lower leg: No edema.  Skin:    General: Skin is warm and dry.     Capillary Refill: Capillary refill takes less than 2 seconds.  Neurological:     General: No focal deficit present.     Mental Status: She is alert. Mental status is at baseline.     Motor: Weakness present.     Gait: Gait abnormal.     Comments: wheelchair  Psychiatric:        Mood and Affect: Mood normal.        Behavior: Behavior normal.        Cognition and Memory: Cognition is impaired. Memory is impaired.     Comments: Very pleasant, follows commands, alert to self and familiar faces     Labs reviewed: Recent Labs    11/21/20 0000 11/21/20 0110 05/11/21 0000  NA 139 141 142  K 3.9 3.7 4.0  CL 105 105 107  CO2 26* 26* 27*  BUN '20 20 20  '$ CREATININE 0.7 0.7 0.5  CALCIUM 8.6* 8.4* 8.8   Recent Labs    11/21/20 0000 11/21/20 0110 05/11/21 0000  AST 15 13 10*  ALT '9 8 8  '$ ALKPHOS 63 65 76  ALBUMIN 3.3* 3.2* 3.2*   Recent Labs    11/21/20 0000 11/21/20 0110 05/11/21 0000  WBC 6.8 7.1 8.8  NEUTROABS 4,821.00 5,730.00 5,262.00  HGB 10.6* 10.7* 10.6*  HCT 34* 34* 34*  PLT 218 220 301   Lab Results  Component Value Date   TSH 0.69 11/21/2020   Lab Results  Component Value Date   HGBA1C 5.6 08/21/2021   Lab Results  Component Value Date   CHOL 96 11/21/2020   HDL 33 (A) 11/21/2020   LDLCALC 45 11/21/2020  TRIG 94 11/21/2020   CHOLHDL 3.0 12/13/2019    Significant Diagnostic Results in last 30 days:  No results found.  Assessment/Plan 1. Weight loss - up 4 lbs in 1 month - cont Boost - cont monthly weights  2. Prediabetes - A1c 5.6 08/21/2021 - diet controlled  3. Vascular dementia  with behavior disturbance ([place]) - increased behaviors in late afternoon - ambulates in wheelchair - poor safety awareness - cont Depakote, Seroquel and ativan prn  4. Primary hypertension - controlled  - cont losartan  5. Mixed hyperlipidemia - cont statin  6. Acquired hypothyroidism - TSH stable - cont levothyroxine  7. Depression with anxiety - no mood changes - cont ativan prn and Zoloft  8. Slow transit constipation - LBM 07/25, abdomen soft - cont colace, and miralax    Family/ staff Communication: plan discussed with patient, daughter and nurse  Labs/tests ordered:

## 2021-09-23 DIAGNOSIS — R2681 Unsteadiness on feet: Secondary | ICD-10-CM | POA: Diagnosis not present

## 2021-09-23 DIAGNOSIS — R2689 Other abnormalities of gait and mobility: Secondary | ICD-10-CM | POA: Diagnosis not present

## 2021-09-28 ENCOUNTER — Encounter: Payer: Self-pay | Admitting: Orthopedic Surgery

## 2021-09-28 ENCOUNTER — Non-Acute Institutional Stay (SKILLED_NURSING_FACILITY): Payer: Medicare Other | Admitting: Orthopedic Surgery

## 2021-09-28 DIAGNOSIS — Z0000 Encounter for general adult medical examination without abnormal findings: Secondary | ICD-10-CM

## 2021-09-28 NOTE — Progress Notes (Signed)
Location:   [facility] Room Number: 39-A Place of Service:  SNF [phone]) Provider:  Windell Moulding, NP  PCP: [name], MD  Patient Care Team: [name], MD as PCP - General (Internal Medicine)  Extended Emergency Contact Information Primary Emergency Contact: Harris,Margaret Address: [address]          [place], [place] [address] [name] of [place] Phone: [phone] Mobile Phone: [phone] Relation: Daughter  Code Status:  DNR Goals of care: Advanced Directive information    09/28/2021   10:10 AM  Advanced Directives  Does Patient Have a Medical Advance Directive? Yes  Type of Paramedic of [place];Living will;Out of facility DNR (pink MOST or yellow form)  Does patient want to make changes to medical advance directive? No - Patient declined  Copy of [place] in Chart? Yes - validated most recent copy scanned in chart (See row information)     Chief Complaint  Patient presents with   Medicare Wellness    Annual Wellness Visit.   Health Maintenance    Discuss the need for Eye exam, and Foot exam.     HPI:  Pt is a 88 y.o. female seen today for medical management of chronic diseases.     Past Medical History:  Diagnosis Date   Hearing loss    History of fracture of clavicle    Hypothyroidism    Insomnia    Tinnitus    Past Surgical History:  Procedure Laterality Date   CHOLECYSTECTOMY  2007   Dr. Verdene Lennert   CLAVICLE EXCISION  1986   INTRAMEDULLARY (IM) NAIL INTERTROCHANTERIC Left 04/09/2020   Procedure: LEFT INTERTROCHANTERIC INTRAMEDULLARY (IM) NAIL;  Surgeon: Leandrew Koyanagi, MD;  Location: [facility];  Service: Orthopedics;  Laterality: Left;   MOHS SURGERY     past 10 years chest & legs   OPEN REDUCTION INTERNAL FIXATION (ORIF) DISTAL RADIAL FRACTURE Left 04/09/2020   Procedure: CLOSED REDUCTION LEFT DISTAL RADIUS FRACTURE;  Surgeon: Leandrew Koyanagi, MD;  Location: [facility];  Service:  Orthopedics;  Laterality: Left;    Allergies  Allergen Reactions   Metformin And Related Diarrhea    Allergies as of 09/28/2021       Reactions   Metformin And Related Diarrhea        Medication List        Accurate as of September 28, 2021 10:10 AM. If you have any questions, ask your nurse or doctor.          acetaminophen 325 MG tablet Commonly known as: TYLENOL Take 650 mg by mouth 3 (three) times daily as needed.   atorvastatin 10 MG tablet Commonly known as: LIPITOR Take 10 mg by mouth daily.   CeraVe Baby 1 % Lotn Generic drug: DIMETHICONE (TOPICAL) Apply 1 application topically daily.   CeraVe Lotn Apply topically every other day. Apply to shoulders, back, and chest   divalproex 125 MG DR tablet Commonly known as: DEPAKOTE Take 125 mg by mouth daily.   docusate sodium 100 MG capsule Commonly known as: COLACE Take 100 mg by mouth daily.   hydrocortisone cream 1 % Apply 1 application. topically 2 (two) times daily. Apply to back, abdomen, and chest   ipratropium-albuterol 0.5-2.5 (3) MG/3ML Soln Commonly known as: DUONEB Take 3 mLs by nebulization every 6 (six) hours as needed.   levothyroxine 75 MCG tablet Commonly known as: SYNTHROID TAKE 1 TABLET (75 MCG TOTAL) BY MOUTH DAILY  BEFORE BREAKFAST.   LORazepam 0.5 MG tablet Commonly known as: ATIVAN Take 1 tablet (0.5 mg total) by mouth 2 (two) times daily as needed.   losartan 25 MG tablet Commonly known as: COZAAR Take 12.5 mg by mouth daily. Hold if SBP<110   metoprolol tartrate 25 MG tablet Commonly known as: LOPRESSOR Take 12.5 mg by mouth 2 (two) times daily.   ondansetron 4 MG tablet Commonly known as: Zofran Take 1 tablet (4 mg total) by mouth 2 (two) times daily as needed for nausea or vomiting.   polyethylene glycol 17 g packet Commonly known as: MIRALAX / GLYCOLAX Take 17 g by mouth daily as needed for moderate constipation.   QUEtiapine 25 MG tablet Commonly known as:  SEROQUEL Take 25 mg by mouth at bedtime.   sertraline 50 MG tablet Commonly known as: ZOLOFT Take 50 mg by mouth daily.   Vitamin D3 50 MCG (2000 UT) Tabs Take 1 tablet by mouth daily.   zinc oxide 20 % ointment Apply 1 application topically as needed for irritation.        Review of Systems  Immunization History  Administered Date(s) Administered   DTaP 07/20/2013   Influenza, High Dose Seasonal PF 12/07/2016   Influenza,inj,Quad PF,6+ Mos 12/01/2017   Influenza-Unspecified 11/20/2014, 12/12/2015, 11/20/2019   Moderna SARS-COV2 Booster Vaccination 08/06/2020   Moderna Sars-Covid-2 Vaccination 03/05/2019, 04/02/2019   PFIZER(Purple Top)SARS-COV-2 Vaccination 04/01/2020, 08/06/2020, 11/19/2020   PPD Test 08/06/2014   Pneumococcal Conjugate-13 11/07/2013   Pneumococcal Polysaccharide-23 03/22/2017   Tetanus 03/02/2015   Zoster Recombinat (Shingrix) 06/07/2017, 08/26/2017   Zoster, Live 03/01/2006   Pertinent  Health Maintenance Due  Topic Date Due   OPHTHALMOLOGY EXAM  Never done   FOOT EXAM  07/23/2021   INFLUENZA VACCINE  09/29/2021   HEMOGLOBIN A1C  02/20/2022   DEXA SCAN  Completed      04/12/2020    9:00 PM 04/13/2020    8:56 AM 04/13/2020    8:00 PM 04/14/2020    7:47 AM 09/12/2020   12:07 PM  Fall Risk  Falls in the past year?     1  Was there an injury with Fall?     1  Fall Risk Category Calculator     3  Fall Risk Category     High  Patient Fall Risk Level High fall risk High fall risk High fall risk High fall risk   Patient at Risk for Falls Due to     History of fall(s);Impaired balance/gait;Impaired mobility;Mental status change  Fall risk Follow up     Falls evaluation completed;Education provided;Falls prevention discussed   Functional Status Survey:    Vitals:   09/28/21 1006  BP: 128/76  Pulse: 92  Resp: (!) 22  Temp: (!) 95.9 F (35.5 C)  SpO2: 97%  Weight: 131 lb 11.2 oz (59.7 kg)  Height: 5' (1.524 m)   Body mass index is 25.72  kg/m. Physical Exam  Labs reviewed: Recent Labs    11/21/20 0000 11/21/20 0110 05/11/21 0000  NA 139 141 142  K 3.9 3.7 4.0  CL 105 105 107  CO2 26* 26* 27*  BUN '20 20 20  '$ CREATININE 0.7 0.7 0.5  CALCIUM 8.6* 8.4* 8.8   Recent Labs    11/21/20 0000 11/21/20 0110 05/11/21 0000  AST 15 13 10*  ALT '9 8 8  '$ ALKPHOS 63 65 76  ALBUMIN 3.3* 3.2* 3.2*   Recent Labs    11/21/20 0000 11/21/20 0110 05/11/21 0000  WBC 6.8 7.1 8.8  NEUTROABS 4,821.00 5,730.00 5,262.00  HGB 10.6* 10.7* 10.6*  HCT 34* 34* 34*  PLT 218 220 301   Lab Results  Component Value Date   TSH 0.69 11/21/2020   Lab Results  Component Value Date   HGBA1C 5.6 08/21/2021   Lab Results  Component Value Date   CHOL 96 11/21/2020   HDL 33 (A) 11/21/2020   LDLCALC 45 11/21/2020   TRIG 94 11/21/2020   CHOLHDL 3.0 12/13/2019    Significant Diagnostic Results in last 30 days:  No results found.  Assessment/Plan There are no diagnoses linked to this encounter.   Family/ staff Communication:   Labs/tests ordered:

## 2021-09-28 NOTE — Patient Instructions (Signed)
  Ms. Lisa Castillo , Thank you for taking time to come for your Medicare Wellness Visit. I appreciate your ongoing commitment to your health goals. Please review the following plan we discussed and let me know if I can assist you in the future.   These are the goals we discussed:  Goals      DIET - INCREASE WATER INTAKE     Maintain Mobility and Function     Evidence-based guidance:  Emphasize the importance of physical activity and aerobic exercise as included in treatment plan; assess barriers to adherence; consider patient's abilities and preferences.  Encourage gradual increase in activity or exercise instead of stopping if pain occurs.  Reinforce individual therapy exercise prescription, such as strengthening, stabilization and stretching programs.  Promote optimal body mechanics to stabilize the spine with lifting and functional activity.  Encourage activity and mobility modifications to facilitate optimal function, such as using a log roll for bed mobility or dressing from a seated position.  Reinforce individual adaptive equipment recommendations to limit excessive spinal movements, such as a Systems analyst.  Assess adequacy of sleep; encourage use of sleep hygiene techniques, such as bedtime routine; use of white noise; dark, cool bedroom; avoiding daytime naps, heavy meals or exercise before bedtime.  Promote positions and modification to optimize sleep and sexual activity; consider pillows or positioning devices to assist in maintaining neutral spine.  Explore options for applying ergonomic principles at work and home, such as frequent position changes, using ergonomically designed equipment and working at optimal height.  Promote modifications to increase comfort with driving such as lumbar support, optimizing seat and steering wheel position, using cruise control and taking frequent rest stops to stretch and walk.   Notes:         This is a list of the screening recommended for  you and due dates:  Health Maintenance  Topic Date Due   Eye exam for diabetics  Never done   Complete foot exam   07/23/2021   COVID-19 Vaccine (6 - Moderna series) 01/27/2022*   Tetanus Vaccine  11/24/2026*   Flu Shot  09/29/2021   Hemoglobin A1C  02/20/2022   Pneumonia Vaccine  Completed   DEXA scan (bone density measurement)  Completed   Zoster (Shingles) Vaccine  Completed   HPV Vaccine  Aged Out  *Topic was postponed. The date shown is not the original due date.

## 2021-09-28 NOTE — Progress Notes (Signed)
Subjective:   Lisa Castillo is a 88 y.o. female who presents for Medicare Annual (Subsequent) preventive examination.  Place of Service: [facility] Provider: Windell Moulding, AGNP-C   Review of Systems     Cardiac Risk Factors include: advanced age (>73mn, >>2women);sedentary lifestyle;hypertension;diabetes mellitus;dyslipidemia     Objective:    Today's Vitals   09/28/21 1006  BP: 128/76  Pulse: 92  Resp: (!) 22  Temp: (!) 95.9 F (35.5 C)  SpO2: 97%  Weight: 131 lb 11.2 oz (59.7 kg)  Height: 5' (1.524 m)   Body mass index is 25.72 kg/m.     09/28/2021   10:10 AM 09/22/2021    4:01 PM 08/19/2021    9:22 AM 07/21/2021   10:15 AM 06/19/2021    9:21 AM 05/08/2021   10:11 AM 04/16/2021   10:57 AM  Advanced Directives  Does Patient Have a Medical Advance Directive? Yes Yes Yes Yes Yes Yes Yes  Type of AParamedicof [place] Lake[place] will;Out of facility DNR (pink MOST or yellow form) [place] will;Out of facility DNR (pink MOST or yellow form) [place] GroveLiving will;Out of facility DNR (pink MOST or yellow form) H[place]Living will;Out of facility DNR (pink MOST or yellow form) HWoodcrestLiving will;Out of facility DNR (pink MOST or yellow form) HVand[place]oortLiving will;Out of facility DNR (pink MOST or yellow form) HMoorheadLiving will;Out of facility DNR (pink MOST or yellow form)  Does patient want to make changes to medical advance directive? No - Patient declined No - Patient declined No - Patient declined No - Patient declined No - Patient declined No - Patient declined No - Patient declined  Copy of HPeach Lakein Chart? Yes - validated most recent copy scanned in chart (See row information) Yes - validated most recent copy scanned in chart (See row information) Yes - validated most recent  copy scanned in chart (See row information) Yes - validated most recent copy scanned in chart (See row information) Yes - validated most recent copy scanned in chart (See row information) Yes - validated most recent copy scanned in chart (See row information) Yes - validated most recent copy scanned in chart (See row information)  Pre-existing out of facility DNR order (yellow form or pink MOST form)  Yellow form placed in chart (order not valid for inpatient use) Yellow form placed in chart (order not valid for inpatient use) Yellow form placed in chart (order not valid for inpatient use) Yellow form placed in chart (order not valid for inpatient use) Yellow form placed in chart (order not valid for inpatient use)     Current Medications (verified) Outpatient Encounter Medications as of 09/28/2021  Medication Sig   acetaminophen (TYLENOL) 325 MG tablet Take 650 mg by mouth 3 (three) times daily as needed.   atorvastatin (LIPITOR) 10 MG tablet Take 10 mg by mouth daily.   Cholecalciferol (VITAMIN D3) 2000 units TABS Take 1 tablet by mouth daily.    DIMETHICONE, TOPICAL, (CERAVE BABY) 1 % LOTN Apply 1 application topically daily.   divalproex (DEPAKOTE) 125 MG DR tablet Take 125 mg by mouth daily.   docusate sodium (COLACE) 100 MG capsule Take 100 mg by mouth daily.   Emollient (CERAVE) LOTN Apply topically every other day. Apply to shoulders, back, and chest   hydrocortisone cream 1 % Apply 1 application. topically 2 (two) times  daily. Apply to back, abdomen, and chest   ipratropium-albuterol (DUONEB) 0.5-2.5 (3) MG/3ML SOLN Take 3 mLs by nebulization every 6 (six) hours as needed.   levothyroxine (SYNTHROID) 75 MCG tablet TAKE 1 TABLET (75 MCG TOTAL) BY MOUTH DAILY BEFORE BREAKFAST.   LORazepam (ATIVAN) 0.5 MG tablet Take 1 tablet (0.5 mg total) by mouth 2 (two) times daily as needed.   losartan (COZAAR) 25 MG tablet Take 12.5 mg by mouth daily. Hold if SBP<110   metoprolol tartrate (LOPRESSOR) 25  MG tablet Take 12.5 mg by mouth 2 (two) times daily.   ondansetron (ZOFRAN) 4 MG tablet Take 1 tablet (4 mg total) by mouth 2 (two) times daily as needed for nausea or vomiting.   polyethylene glycol (MIRALAX / GLYCOLAX) 17 g packet Take 17 g by mouth daily as needed for moderate constipation.   QUEtiapine (SEROQUEL) 25 MG tablet Take 25 mg by mouth at bedtime.   sertraline (ZOLOFT) 50 MG tablet Take 50 mg by mouth daily.   zinc oxide 20 % ointment Apply 1 application topically as needed for irritation.   No facility-administered encounter medications on file as of 09/28/2021.    Allergies (verified) Metformin and related   History: Past Medical History:  Diagnosis Date   Hearing loss    History of fracture of clavicle    Hypothyroidism    Insomnia    Tinnitus    Past Surgical History:  Procedure Laterality Date   CHOLECYSTECTOMY  2007   Dr. Verdene Lennert   CLAVICLE EXCISION  1986   INTRAMEDULLARY (IM) NAIL INTERTROCHANTERIC Left 04/09/2020   Procedure: LEFT INTERTROCHANTERIC INTRAMEDULLARY (IM) NAIL;  Surgeon: Leandrew Koyanagi, MD;  Location: [place];  Service: Orthopedics;  Laterality: Left;   MOHS SURGERY     past 10 years chest & legs   OPEN REDUCTION INTERNAL FIXATION (ORIF) DISTAL RADIAL FRACTURE Left 04/09/2020   Procedure: CLOSED REDUCTION LEFT DISTAL RADIUS FRACTURE;  Surgeon: Leandrew Koyanagi, MD;  Location: [place];  Service: Orthopedics;  Laterality: Left;   Family History  Problem Relation Age of Onset   Heart disease Mother    Heart disease Father    Social History   Socioeconomic History   Marital status: Widowed    Spouse name: Not on file   Number of children: Not on file   Years of education: Not on file   Highest education level: Not on file  Occupational History   Occupation: retired Web designer  Tobacco Use   Smoking status: Never   Smokeless tobacco: Never  Vaping Use   Vaping Use: Never used  Substance and Sexual Activity   Alcohol use: Yes    Comment: 4 per  week   Drug use: No   Sexual activity: Never  Other Topics Concern   Not on file  Social History Narrative   L[facility] at [facility] since 12/23/14   Widowed   Never smoked   Alcohol 5 per week   Exercise 3 times a week classes, walking.    Activities Bridge, book groups   POA, Living Will   Still driving      Social Determinants of Health   Financial Resource Strain: Low Risk  (09/28/2021)   Overall Financial Resource Strain (CARDIA)    Difficulty of Paying Living Expenses: Not hard at all  Food Insecurity: No Food Insecurity (09/28/2021)   Hunger Vital Sign    Worried About Running Out of Food in the Last Year: Never true  Ran Out of Food in the Last Year: Never true  Transportation Needs: No Transportation Needs (09/28/2021)   PRAPARE - Hydrologist (Medical): No    Lack of Transportation (Non-Medical): No  Physical Activity: Insufficiently Active (09/28/2021)   Exercise Vital Sign    Days of Exercise per Week: 7 days    Minutes of Exercise per Session: 10 min  Stress: No Stress Concern Present (09/28/2021)   [place]    Feeling of Stress : Only a little  Social Connections: Moderately Isolated (09/28/2021)   Social Connection and Isolation Panel [NHANES]    Frequency of Communication with Friends and Family: Three times a week    Frequency of Social Gatherings with Friends and Family: Three times a week    Attends Religious Services: More than 4 times per year    Active Member of Clubs or Organizations: No    Attends Archivist Meetings: Never    Marital Status: Widowed    Tobacco Counseling Counseling given: Not Answered   Clinical Intake:  Pre-visit preparation completed: No  Pain : No/denies pain     BMI - recorded: 25.72 Nutritional Status: BMI 25 -29 Overweight Nutritional Risks: None Diabetes: Yes CBG done?: Yes CBG resulted in Enter/ Edit  results?: No (119 this morning per nursing)  How often do you need to have someone help you when you read instructions, pamphlets, or other written materials from your doctor or pharmacy?: 5 - Always What is the last grade level you completed in school?: 4 years college at [employer]  Diabetic?Yes  Interpreter Needed?: No      Activities of Daily Living    09/28/2021   10:30 AM  In your present state of health, do you have any difficulty performing the following activities:  Hearing? 1  Vision? 0  Difficulty concentrating or making decisions? 1  Walking or climbing stairs? 1  Dressing or bathing? 1  Doing errands, shopping? 1  Preparing Food and eating ? Y  Using the Toilet? Y  In the past six months, have you accidently leaked urine? Y  Do you have problems with loss of bowel control? N  Managing your Medications? Y  Managing your Finances? Y  Housekeeping or managing your Housekeeping? Y    Patient Care Team: [name], MD as PCP - General (Internal Medicine)  Indicate any recent Medical Services you may have received from other than [facility] providers in the past year (date may be approximate).     Assessment:   This is a routine wellness examination for Lisa Castillo.  Hearing/Vision screen No results found.  Dietary issues and exercise activities discussed: Current Exercise Habits: The patient does not participate in regular exercise at present, Exercise limited by: neurologic condition(s);cardiac condition(s);orthopedic condition(s)   Goals Addressed             This Visit's Progress    DIET - INCREASE WATER INTAKE   On track    Maintain Mobility and Function   On track    Evidence-based guidance:  Emphasize the importance of physical activity and aerobic exercise as included in treatment plan; assess barriers to adherence; consider patient's abilities and preferences.  Encourage gradual increase in activity or exercise instead of stopping if pain occurs.   Reinforce individual therapy exercise prescription, such as strengthening, stabilization and stretching programs.  Promote optimal body mechanics to stabilize the spine with lifting and functional activity.  Encourage  activity and mobility modifications to facilitate optimal function, such as using a log roll for bed mobility or dressing from a seated position.  Reinforce individual adaptive equipment recommendations to limit excessive spinal movements, such as a Systems analyst.  Assess adequacy of sleep; encourage use of sleep hygiene techniques, such as bedtime routine; use of white noise; dark, cool bedroom; avoiding daytime naps, heavy meals or exercise before bedtime.  Promote positions and modification to optimize sleep and sexual activity; consider pillows or positioning devices to assist in maintaining neutral spine.  Explore options for applying ergonomic principles at work and home, such as frequent position changes, using ergonomically designed equipment and working at optimal height.  Promote modifications to increase comfort with driving such as lumbar support, optimizing seat and steering wheel position, using cruise control and taking frequent rest stops to stretch and walk.   Notes:        Depression Screen    09/28/2021   10:28 AM 09/12/2020   12:05 PM 06/03/2017    1:31 PM 01/19/2017   10:07 AM 11/23/2016    2:56 PM 09/09/2015   10:33 AM  PHQ 2/9 Scores  PHQ - 2 Score 0 0 0 0 0 0    Fall Risk    09/28/2021   10:30 AM 09/12/2020   12:07 PM 12/19/2019    2:44 PM 10/17/2019    8:53 AM 08/22/2019    1:06 PM  Fall Risk   Falls in the past year? 1 1 0 0 0  Number falls in past yr: 1 1 0 0 0  Injury with Fall? 0 1   0  Risk for fall due to : History of fall(s);Impaired balance/gait;Impaired mobility History of fall(s);Impaired balance/gait;Impaired mobility;Mental status change     Follow up Falls evaluation completed;Education provided;Falls prevention discussed Falls  evaluation completed;Education provided;Falls prevention discussed       FALL RISK PREVENTION PERTAINING TO THE HOME:  Any stairs in or around the home? No  If so, are there any without handrails? No  Home free of loose throw rugs in walkways, pet beds, electrical cords, etc? Yes  Adequate lighting in your home to reduce risk of falls? Yes   ASSISTIVE DEVICES UTILIZED TO PREVENT FALLS:  Life alert? No  Use of a cane, walker or w/c? Yes  Grab bars in the bathroom? Yes  Shower chair or bench in shower? Yes  Elevated toilet seat or a handicapped toilet? Yes   TIMED UP AND GO:  Was the test performed? No .  Length of time to ambulate 10 feet: N/A sec.   Gait slow and steady with assistive device  Cognitive Function:    09/28/2021   10:31 AM 09/12/2020   12:09 PM 06/13/2019    4:28 PM 10/04/2018    3:50 PM 01/19/2017   10:47 AM  MMSE - Mini Mental State Exam  Not completed:  Unable to complete     Orientation to time 0  '3 2 5  '$ Orientation to Place '3  5 5 5  '$ Registration '2  3 3 3  '$ Attention/ Calculation 5  0 5 5  Recall '2  2 3 3  '$ Language- name 2 objects '1  2 2 2  '$ Language- repeat '1  1 1 1  '$ Language- follow 3 step command '3  3 3 3  '$ Language- read & follow direction 1  1 0 1  Write a sentence 1  0 1 1  Copy design 1  1 0 1  Total score '20  21 25 30        '$ 09/28/2021   10:34 AM 09/12/2020   12:09 PM  6CIT Screen  What Year? 4 points 0 points  What month? 3 points 0 points  What time? 3 points 3 points  Count back from 20 2 points 2 points  Months in reverse 2 points 2 points  Repeat phrase 8 points 4 points  Total Score 22 points 11 points    Immunizations Immunization History  Administered Date(s) Administered   DTaP 07/20/2013   Influenza, High Dose Seasonal PF 12/07/2016   Influenza,inj,Quad PF,6+ Mos 12/01/2017   Influenza-Unspecified 11/20/2014, 12/12/2015, 11/20/2019   Moderna SARS-COV2 Booster Vaccination 08/06/2020   Moderna Sars-Covid-2 Vaccination  03/05/2019, 04/02/2019   PFIZER(Purple Top)SARS-COV-2 Vaccination 04/01/2020, 08/06/2020, 11/19/2020   PPD Test 08/06/2014   Pneumococcal Conjugate-13 11/07/2013   Pneumococcal Polysaccharide-23 03/22/2017   Tetanus 03/02/2015   Zoster Recombinat (Shingrix) 06/07/2017, 08/26/2017   Zoster, Live 03/01/2006    TDAP status: Up to date  Flu Vaccine status: Up to date  Pneumococcal vaccine status: Up to date  Covid-19 vaccine status: Completed vaccines  Qualifies for Shingles Vaccine? Yes   Zostavax completed Yes   Shingrix Completed?: Yes  Screening Tests Health Maintenance  Topic Date Due   OPHTHALMOLOGY EXAM  Never done   FOOT EXAM  07/23/2021   COVID-19 Vaccine (6 - Moderna series) 01/27/2022 (Originally 01/14/2021)   TETANUS/TDAP  11/24/2026 (Originally 03/01/2025)   INFLUENZA VACCINE  09/29/2021   HEMOGLOBIN A1C  02/20/2022   Pneumonia Vaccine 19+ Years old  Completed   DEXA SCAN  Completed   Zoster Vaccines- Shingrix  Completed   HPV VACCINES  Aged Out    Health Maintenance  Health Maintenance Due  Topic Date Due   OPHTHALMOLOGY EXAM  Never done   FOOT EXAM  07/23/2021    Colorectal cancer screening: No longer required.   Mammogram status: No longer required due to advanced age.  Bone Density status: Completed 2020. Results reflect: Bone density results: OSTEOPENIA. Repeat every 2 years.  Lung Cancer Screening: (Low Dose CT Chest recommended if Age 56-80 years, 30 pack-year currently smoking OR have quit w/in 15years.) does not qualify.   Lung Cancer Screening Referral: no  Additional Screening:  Hepatitis C Screening: does not qualify; Completed   Vision Screening: Recommended annual ophthalmology exams for early detection of glaucoma and other disorders of the eye. Is the patient up to date with their annual eye exam?  No  Who is the provider or what is the name of the office in which the patient attends annual eye exams? In house provider per [facility] If pt is not established with a provider, would they like to be referred to a provider to establish care? No .   Dental Screening: Recommended annual dental exams for proper oral hygiene  Community Resource Referral / Chronic Care Management: CRR required this visit?  No   CCM required this visit?  No      Plan:     I have personally reviewed and noted the following in the patient's chart:   Medical and social history Use of alcohol, tobacco or illicit drugs  Current medications and supplements including opioid prescriptions.  Functional ability and status Nutritional status Physical activity Advanced directives List of other physicians Hospitalizations, surgeries, and ER visits in previous 12 months Vitals Screenings to include cognitive, depression, and falls Referrals and appointments  In addition, I have reviewed and discussed  with patient certain preventive protocols, quality metrics, and best practice recommendations. A written personalized care plan for preventive services as well as general preventive health recommendations were provided to patient.     Lisa Alanis, NP   09/28/2021   Nurse Notes: will discuss diabetic eye exam with daughter next routine visit

## 2021-10-02 ENCOUNTER — Other Ambulatory Visit: Payer: Self-pay | Admitting: Orthopedic Surgery

## 2021-10-02 DIAGNOSIS — F01518 Vascular dementia, unspecified severity, with other behavioral disturbance: Secondary | ICD-10-CM

## 2021-10-02 DIAGNOSIS — F03911 Unspecified dementia, unspecified severity, with agitation: Secondary | ICD-10-CM

## 2021-10-02 MED ORDER — LORAZEPAM 0.5 MG PO TABS: 0.5000 mg | ORAL_TABLET | Freq: Two times a day (BID) | ORAL | 0 refills | Status: DC | PRN

## 2021-10-15 ENCOUNTER — Non-Acute Institutional Stay (SKILLED_NURSING_FACILITY): Payer: Medicare Other | Admitting: Internal Medicine

## 2021-10-15 ENCOUNTER — Encounter: Payer: Self-pay | Admitting: Internal Medicine

## 2021-10-15 DIAGNOSIS — E119 Type 2 diabetes mellitus without complications: Secondary | ICD-10-CM

## 2021-10-15 DIAGNOSIS — F418 Other specified anxiety disorders: Secondary | ICD-10-CM | POA: Diagnosis not present

## 2021-10-15 DIAGNOSIS — D649 Anemia, unspecified: Secondary | ICD-10-CM | POA: Diagnosis not present

## 2021-10-15 DIAGNOSIS — I10 Essential (primary) hypertension: Secondary | ICD-10-CM

## 2021-10-15 DIAGNOSIS — E039 Hypothyroidism, unspecified: Secondary | ICD-10-CM

## 2021-10-15 DIAGNOSIS — E782 Mixed hyperlipidemia: Secondary | ICD-10-CM | POA: Diagnosis not present

## 2021-10-15 DIAGNOSIS — F01518 Vascular dementia, unspecified severity, with other behavioral disturbance: Secondary | ICD-10-CM

## 2021-10-15 NOTE — Progress Notes (Signed)
Location:   [place] Room Number: 39 Place of Service:  SNF [phone]) Provider:  Veleta Miners MD   [name], MD  Patient Care Team: [name], MD as PCP - General (Internal Medicine)  Extended Emergency Contact Information Primary Emergency Contact: Harris,Margaret Address: [address]          [place], [place] [address] [name] of [place] Phone: [phone] Mobile Phone: [phone] Relation: Daughter  Code Status:  DNR Palliative Care Goals of care: Advanced Directive information    10/15/2021   10:38 AM  Advanced Directives  Does Patient Have a Medical Advance Directive? Yes  Type of Paramedic of [place];Living will;Out of facility DNR (pink MOST or yellow form)  Does patient want to make changes to medical advance directive? No - Patient declined  Copy of [place] in Chart? Yes - validated most recent copy scanned in chart (See row information)  Pre-existing out of facility DNR order (yellow form or pink MOST form) Yellow form placed in chart (order not valid for inpatient use)     Chief Complaint  Patient presents with   Medical Management of Chronic Issues   Quality Metric Gaps    OPHTHALMOLOGY EXAM (Yearly)   FOOT EXAM (Yearly) INFLUENZA VACCINE       HPI:  Pt is a 88 y.o. female seen today for medical management of chronic diseases.    [name] Resident of SNF    Patient has a history of type 2 diabetes, hyperlipidemia, depression, dementia, hypothyroidism, osteopenia, stress and urge urinary incontinence, Tachycardia  h/o Hip fracture and Compression fracture after falls   She is stable. No new Nursing issues.  Sometimes gets agitated in the evening And tries to leave the facility Now stays mostly in her walker No Recent Falls Pleasantly confused No other issues Wt Readings from Last 3 Encounters:  10/15/21 125 lb 1.6 oz (56.7 kg)  09/28/21 131 lb 11.2 oz  (59.7 kg)  09/22/21 131 lb 11.2 oz (59.7 kg)   Past Medical History:  Diagnosis Date   Hearing loss    History of fracture of clavicle    Hypothyroidism    Insomnia    Tinnitus    Past Surgical History:  Procedure Laterality Date   CHOLECYSTECTOMY  2007   Dr. Verdene Lennert   CLAVICLE EXCISION  1986   INTRAMEDULLARY (IM) NAIL INTERTROCHANTERIC Left 04/09/2020   Procedure: LEFT INTERTROCHANTERIC INTRAMEDULLARY (IM) NAIL;  Surgeon: Leandrew Koyanagi, MD;  Location: [place];  Service: Orthopedics;  Laterality: Left;   MOHS SURGERY     past 10 years chest & legs   OPEN REDUCTION INTERNAL FIXATION (ORIF) DISTAL RADIAL FRACTURE Left 04/09/2020   Procedure: CLOSED REDUCTION LEFT DISTAL RADIUS FRACTURE;  Surgeon: Leandrew Koyanagi, MD;  Location: [facility];  Service: Orthopedics;  Laterality: Left;    Allergies  Allergen Reactions   Metformin And Related Diarrhea    Allergies as of 10/15/2021       Reactions   Metformin And Related Diarrhea        Medication List        Accurate as of October 15, 2021 10:42 AM. If you have any questions, ask your nurse or doctor.          acetaminophen 325 MG tablet Commonly known as: TYLENOL Take 650 mg by mouth 3 (three) times daily as needed.   atorvastatin 10 MG tablet Commonly known as: LIPITOR Take 10  mg by mouth daily.   CeraVe Baby 1 % Lotn Generic drug: DIMETHICONE (TOPICAL) Apply 1 application topically daily.   CeraVe Lotn Apply topically every other day. Apply to shoulders, back, and chest   divalproex 125 MG DR tablet Commonly known as: DEPAKOTE Take 125 mg by mouth daily.   docusate sodium 100 MG capsule Commonly known as: COLACE Take 100 mg by mouth daily.   hydrocortisone cream 1 % Apply 1 application. topically 2 (two) times daily. Apply to back, abdomen, and chest   ipratropium-albuterol 0.5-2.5 (3) MG/3ML Soln Commonly known as: DUONEB Take 3 mLs by nebulization every 6 (six) hours as needed.   levothyroxine 75 MCG  tablet Commonly known as: SYNTHROID TAKE 1 TABLET (75 MCG TOTAL) BY MOUTH DAILY BEFORE BREAKFAST.   LORazepam 0.5 MG tablet Commonly known as: ATIVAN Take 1 tablet (0.5 mg total) by mouth 2 (two) times daily as needed.   losartan 25 MG tablet Commonly known as: COZAAR Take 12.5 mg by mouth daily. Hold if SBP<110   metoprolol tartrate 25 MG tablet Commonly known as: LOPRESSOR Take 12.5 mg by mouth 2 (two) times daily.   ondansetron 4 MG tablet Commonly known as: Zofran Take 1 tablet (4 mg total) by mouth 2 (two) times daily as needed for nausea or vomiting.   polyethylene glycol 17 g packet Commonly known as: MIRALAX / GLYCOLAX Take 17 g by mouth daily as needed for moderate constipation.   QUEtiapine 25 MG tablet Commonly known as: SEROQUEL Take 25 mg by mouth at bedtime.   sertraline 50 MG tablet Commonly known as: ZOLOFT Take 50 mg by mouth daily.   Vitamin D3 50 MCG (2000 UT) Tabs Take 1 tablet by mouth daily.   zinc oxide 20 % ointment Apply 1 application topically as needed for irritation.        Review of Systems  Unable to perform ROS: Dementia    Immunization History  Administered Date(s) Administered   DTaP 07/20/2013   Influenza, High Dose Seasonal PF 12/07/2016   Influenza,inj,Quad PF,6+ Mos 12/01/2017   Influenza-Unspecified 11/20/2014, 12/12/2015, 11/20/2019   Moderna SARS-COV2 Booster Vaccination 08/06/2020   Moderna Sars-Covid-2 Vaccination 03/05/2019, 04/02/2019   PFIZER(Purple Top)SARS-COV-2 Vaccination 04/01/2020, 08/06/2020, 11/19/2020   PPD Test 08/06/2014   Pneumococcal Conjugate-13 11/07/2013   Pneumococcal Polysaccharide-23 03/22/2017   Tetanus 03/02/2015   Zoster Recombinat (Shingrix) 06/07/2017, 08/26/2017   Zoster, Live 03/01/2006   Pertinent  Health Maintenance Due  Topic Date Due   OPHTHALMOLOGY EXAM  Never done   FOOT EXAM  07/23/2021   INFLUENZA VACCINE  09/29/2021   HEMOGLOBIN A1C  02/20/2022   DEXA SCAN  Completed       04/13/2020    8:56 AM 04/13/2020    8:00 PM 04/14/2020    7:47 AM 09/12/2020   12:07 PM 09/28/2021   10:30 AM  Fall Risk  Falls in the past year?    1 1  Was there an injury with Fall?    1 0  Fall Risk Category Calculator    3 2  Fall Risk Category    High Moderate  Patient Fall Risk Level High fall risk High fall risk High fall risk  Moderate fall risk  Patient at Risk for Falls Due to    History of fall(s);Impaired balance/gait;Impaired mobility;Mental status change History of fall(s);Impaired balance/gait;Impaired mobility  Fall risk Follow up    Falls evaluation completed;Education provided;Falls prevention discussed Falls evaluation completed;Education provided;Falls prevention discussed   Functional Status Survey:  Vitals:   10/15/21 0941  BP: 122/73  Pulse: 81  Resp: 18  Temp: 97.7 F (36.5 C)  SpO2: 95%  Weight: 125 lb 1.6 oz (56.7 kg)  Height: 5' (1.524 m)   Body mass index is 24.43 kg/m. Physical Exam Vitals reviewed.  Constitutional:      Appearance: Normal appearance.  HENT:     Head: Normocephalic.     Nose: Nose normal.     Mouth/Throat:     Mouth: Mucous membranes are moist.     Pharynx: Oropharynx is clear.  Eyes:     Pupils: Pupils are equal, round, and reactive to light.  Cardiovascular:     Rate and Rhythm: Normal rate and regular rhythm.     Pulses: Normal pulses.     Heart sounds: Normal heart sounds. No murmur heard. Pulmonary:     Effort: Pulmonary effort is normal.     Breath sounds: Normal breath sounds.  Abdominal:     General: Abdomen is flat. Bowel sounds are normal.     Palpations: Abdomen is soft.  Musculoskeletal:        General: No swelling.     Cervical back: Neck supple.  Skin:    General: Skin is warm.  Neurological:     General: No focal deficit present.     Mental Status: She is alert.  Psychiatric:        Mood and Affect: Mood normal.        Thought Content: Thought content normal.     Labs  reviewed: Recent Labs    11/21/20 0000 11/21/20 0110 05/11/21 0000  NA 139 141 142  K 3.9 3.7 4.0  CL 105 105 107  CO2 26* 26* 27*  BUN '20 20 20  '$ CREATININE 0.7 0.7 0.5  CALCIUM 8.6* 8.4* 8.8   Recent Labs    11/21/20 0000 11/21/20 0110 05/11/21 0000  AST 15 13 10*  ALT '9 8 8  '$ ALKPHOS 63 65 76  ALBUMIN 3.3* 3.2* 3.2*   Recent Labs    11/21/20 0000 11/21/20 0110 05/11/21 0000  WBC 6.8 7.1 8.8  NEUTROABS 4,821.00 5,730.00 5,262.00  HGB 10.6* 10.7* 10.6*  HCT 34* 34* 34*  PLT 218 220 301   Lab Results  Component Value Date   TSH 0.69 11/21/2020   Lab Results  Component Value Date   HGBA1C 5.6 08/21/2021   Lab Results  Component Value Date   CHOL 96 11/21/2020   HDL 33 (A) 11/21/2020   LDLCALC 45 11/21/2020   TRIG 94 11/21/2020   CHOLHDL 3.0 12/13/2019    Significant Diagnostic Results in last 30 days:  No results found.  Assessment/Plan 1. Vascular dementia with behavior disturbance ([place]) Is doing well in SNF Still continue to be Elope risk On Seroquel and Depakote  2. Primary hypertension On Metoprolol Can consider discontinuing Cozaar now  3. Acquired hypothyroidism Repeat TSH  4. Depression with anxiety On Zoloft  5. Mixed hyperlipidemia Discontinue statin if ok with family  6. Diabetes mellitus type 2 in nonobese (HCC) Repeat A1C Off all meds  7. Anemia, unspecified type Repeat CBC    Family/ staff Communication:   Labs/tests ordered:  A1C,CBC,CMP,TSH

## 2021-10-16 ENCOUNTER — Other Ambulatory Visit: Payer: Self-pay | Admitting: Orthopedic Surgery

## 2021-10-16 NOTE — Progress Notes (Signed)
Patient weight retaken due to reported 6 lbs weight loss. Current weight 139.8 lbs.

## 2021-10-20 ENCOUNTER — Non-Acute Institutional Stay (SKILLED_NURSING_FACILITY): Payer: Medicare Other | Admitting: Adult Health

## 2021-10-20 ENCOUNTER — Encounter: Payer: Self-pay | Admitting: Adult Health

## 2021-10-20 DIAGNOSIS — I10 Essential (primary) hypertension: Secondary | ICD-10-CM

## 2021-10-20 DIAGNOSIS — F418 Other specified anxiety disorders: Secondary | ICD-10-CM

## 2021-10-20 DIAGNOSIS — F03911 Unspecified dementia, unspecified severity, with agitation: Secondary | ICD-10-CM | POA: Diagnosis not present

## 2021-10-20 NOTE — Progress Notes (Unsigned)
Location:  [facility] Room Number: 45 A Place of Service:  SNF (31) Provider:  Durenda Age, DNP, FNP-BC  Patient Care Team: [name], MD as PCP - General (Internal Medicine)  Extended Emergency Contact Information Primary Emergency Contact: Harris,Margaret Address: [place], [place] [address] [name] of [place] Phone: [phone] Mobile Phone: [phone] Relation: Daughter  Code Status:  ***  Goals of care: Advanced Directive information    10/15/2021   10:38 AM  Advanced Directives  Does Patient Have a Medical Advance Directive? Yes  Type of Paramedic of [place];Living will;Out of facility DNR (pink MOST or yellow form)  Does patient want to make changes to medical advance directive? No - Patient declined  Copy of [place] in Chart? Yes - validated most recent copy scanned in chart (See row information)  Pre-existing out of facility DNR order (yellow form or pink MOST form) Yellow form placed in chart (order not valid for inpatient use)     Chief Complaint  Patient presents with   Acute Visit    agitation    HPI:  Pt is a 88 y.o. female seen today for medical management of chronic diseases.  ***   Past Medical History:  Diagnosis Date   Hearing loss    History of fracture of clavicle    Hypothyroidism    Insomnia    Tinnitus    Past Surgical History:  Procedure Laterality Date   CHOLECYSTECTOMY  2007   Dr. Verdene Lennert   CLAVICLE EXCISION  1986   INTRAMEDULLARY (IM) NAIL INTERTROCHANTERIC Left 04/09/2020   Procedure: LEFT INTERTROCHANTERIC INTRAMEDULLARY (IM) NAIL;  Surgeon: Leandrew Koyanagi, MD;  Location: [facility];  Service: Orthopedics;  Laterality: Left;   MOHS SURGERY     past 10 years chest & legs   OPEN REDUCTION INTERNAL FIXATION (ORIF) DISTAL RADIAL FRACTURE Left 04/09/2020   Procedure: CLOSED REDUCTION LEFT DISTAL RADIUS FRACTURE;  Surgeon: Leandrew Koyanagi, MD;  Location: [facility];  Service: Orthopedics;  Laterality: Left;    Allergies  Allergen Reactions   Metformin And Related Diarrhea    Outpatient Encounter Medications as of 10/20/2021  Medication Sig   acetaminophen (TYLENOL) 325 MG tablet Take 650 mg by mouth 3 (three) times daily as needed.   atorvastatin (LIPITOR) 10 MG tablet Take 10 mg by mouth daily.   Cholecalciferol (VITAMIN D3) 2000 units TABS Take 1 tablet by mouth daily.    DIMETHICONE, TOPICAL, (CERAVE BABY) 1 % LOTN Apply 1 application topically daily.   divalproex (DEPAKOTE) 125 MG DR tablet Take 125 mg by mouth daily.   docusate sodium (COLACE) 100 MG capsule Take 100 mg by mouth daily.   Emollient (CERAVE) LOTN Apply topically every other day. Apply to shoulders, back, and chest   hydrocortisone cream 1 % Apply 1 application. topically 2 (two) times daily. Apply to back, abdomen, and chest   ipratropium-albuterol (DUONEB) 0.5-2.5 (3) MG/3ML SOLN Take 3 mLs by nebulization every 6 (six) hours as needed.   levothyroxine (SYNTHROID) 75 MCG tablet TAKE 1 TABLET (75 MCG TOTAL) BY MOUTH DAILY BEFORE BREAKFAST.   LORazepam (ATIVAN) 0.5 MG tablet Take 1 tablet (0.5 mg total) by mouth 2 (two) times daily as needed.   losartan (COZAAR) 25 MG tablet Take 12.5 mg by mouth daily. Hold if SBP<110   metoprolol tartrate (LOPRESSOR) 25 MG tablet Take 12.5 mg by  mouth 2 (two) times daily.   ondansetron (ZOFRAN) 4 MG tablet Take 1 tablet (4 mg total) by mouth 2 (two) times daily as needed for nausea or vomiting.   polyethylene glycol (MIRALAX / GLYCOLAX) 17 g packet Take 17 g by mouth daily as needed for moderate constipation.   QUEtiapine (SEROQUEL) 25 MG tablet Take 25 mg by mouth at bedtime.   sertraline (ZOLOFT) 50 MG tablet Take 50 mg by mouth daily.   zinc oxide 20 % ointment Apply 1 application topically as needed for irritation.   No facility-administered encounter medications on file as of 10/20/2021.    Review of  Systems ***    Immunization History  Administered Date(s) Administered   DTaP 07/20/2013   Influenza, High Dose Seasonal PF 12/07/2016   Influenza,inj,Quad PF,6+ Mos 12/01/2017   Influenza-Unspecified 11/20/2014, 12/12/2015, 11/20/2019   Moderna SARS-COV2 Booster Vaccination 08/06/2020   Moderna Sars-Covid-2 Vaccination 03/05/2019, 04/02/2019   PFIZER(Purple Top)SARS-COV-2 Vaccination 04/01/2020, 08/06/2020, 11/19/2020   PPD Test 08/06/2014   Pneumococcal Conjugate-13 11/07/2013   Pneumococcal Polysaccharide-23 03/22/2017   Tetanus 03/02/2015   Zoster Recombinat (Shingrix) 06/07/2017, 08/26/2017   Zoster, Live 03/01/2006   Pertinent  Health Maintenance Due  Topic Date Due   OPHTHALMOLOGY EXAM  Never done   FOOT EXAM  07/23/2021   INFLUENZA VACCINE  09/29/2021   HEMOGLOBIN A1C  02/20/2022   DEXA SCAN  Completed      04/13/2020    8:56 AM 04/13/2020    8:00 PM 04/14/2020    7:47 AM 09/12/2020   12:07 PM 09/28/2021   10:30 AM  Fall Risk  Falls in the past year?    1 1  Was there an injury with Fall?    1 0  Fall Risk Category Calculator    3 2  Fall Risk Category    High Moderate  Patient Fall Risk Level High fall risk High fall risk High fall risk  Moderate fall risk  Patient at Risk for Falls Due to    History of fall(s);Impaired balance/gait;Impaired mobility;Mental status change History of fall(s);Impaired balance/gait;Impaired mobility  Fall risk Follow up    Falls evaluation completed;Education provided;Falls prevention discussed Falls evaluation completed;Education provided;Falls prevention discussed     Vitals:   10/20/21 1653  BP: 122/73  Pulse: 81  Resp: 18  Temp: 97.7 F (36.5 C)  Weight: 139 lb 12.8 oz (63.4 kg)  Height: 5' (1.524 m)   Body mass index is 27.3 kg/m.  Physical Exam     Labs reviewed: Recent Labs    11/21/20 0000 11/21/20 0110 05/11/21 0000  NA 139 141 142  K 3.9 3.7 4.0  CL 105 105 107  CO2 26* 26* 27*  BUN '20 20 20   '$ CREATININE 0.7 0.7 0.5  CALCIUM 8.6* 8.4* 8.8   Recent Labs    11/21/20 0000 11/21/20 0110 05/11/21 0000  AST 15 13 10*  ALT '9 8 8  '$ ALKPHOS 63 65 76  ALBUMIN 3.3* 3.2* 3.2*   Recent Labs    11/21/20 0000 11/21/20 0110 05/11/21 0000  WBC 6.8 7.1 8.8  NEUTROABS 4,821.00 5,730.00 5,262.00  HGB 10.6* 10.7* 10.6*  HCT 34* 34* 34*  PLT 218 220 301   Lab Results  Component Value Date   TSH 0.69 11/21/2020   Lab Results  Component Value Date   HGBA1C 5.6 08/21/2021   Lab Results  Component Value Date   CHOL 96 11/21/2020   HDL 33 (A) 11/21/2020   LDLCALC 45  11/21/2020   TRIG 94 11/21/2020   CHOLHDL 3.0 12/13/2019    Significant Diagnostic Results in last 30 days:  No results found.  Assessment/Plan ***   Family/ staff Communication: Discussed plan of care with resident and charge nurse  Labs/tests ordered:     Durenda Age, DNP, MSN, FNP-BC [facility] [phone] (Monday-Friday 8:00 a.m. - 5:00 p.m.) [phone] (after hours)

## 2021-10-23 ENCOUNTER — Non-Acute Institutional Stay (SKILLED_NURSING_FACILITY): Payer: Medicare Other | Admitting: Orthopedic Surgery

## 2021-10-23 ENCOUNTER — Encounter: Payer: Self-pay | Admitting: Orthopedic Surgery

## 2021-10-23 DIAGNOSIS — E119 Type 2 diabetes mellitus without complications: Secondary | ICD-10-CM

## 2021-10-23 DIAGNOSIS — F03911 Unspecified dementia, unspecified severity, with agitation: Secondary | ICD-10-CM

## 2021-10-23 DIAGNOSIS — F01518 Vascular dementia, unspecified severity, with other behavioral disturbance: Secondary | ICD-10-CM

## 2021-10-23 DIAGNOSIS — B3731 Acute candidiasis of vulva and vagina: Secondary | ICD-10-CM

## 2021-10-23 NOTE — Progress Notes (Signed)
Location:  [facility] Room Number: 39/A Place of Service:  SNF [phone]) Provider:  Yvonna Alanis, NP   [name], MD  Patient Care Team: [name], MD as PCP - General (Internal Medicine)  Extended Emergency Contact Information Primary Emergency Contact: Harris,Margaret Address: [address]          [place], [place] [address] [name] of [place] Phone: [phone] Mobile Phone: [phone] Relation: Daughter  Code Status:  DNR Goals of care: Advanced Directive information    10/15/2021   10:38 AM  Advanced Directives  Does Patient Have a Medical Advance Directive? Yes  Type of Paramedic of [place];Living will;Out of facility DNR (pink MOST or yellow form)  Does patient want to make changes to medical advance directive? No - Patient declined  Copy of [place] in Chart? Yes - validated most recent copy scanned in chart (See row information)  Pre-existing out of facility DNR order (yellow form or pink MOST form) Yellow form placed in chart (order not valid for inpatient use)     Chief Complaint  Patient presents with   Acute Visit    Vaginal itching    HPI:  Pt is a 88 y.o. female seen today for acute visit due to vaginal itching.   Patient reports itching to peri area x 2 days. She is incontinent of bowel and bladder at times. Wears briefs.   Dementia- followed by palliative, BIMS 6/15 07/30/2021, was 3/15 05/05/2021, CT head 04/2020 age related atrophy and chronic small vessel ischemia, increased agitation in afternoons and also with residents at times, remains on Depakote and Ativan, Seroquel increased 2 days ago- behaviors have improved T2DM- a1c 5.6 08/21/2021, blood sugars averaging 100-130's, no hypoglycemic events, remains on carb mod diet   Past Medical History:  Diagnosis Date   Hearing loss    History of fracture of clavicle    Hypothyroidism    Insomnia    Tinnitus    Past  Surgical History:  Procedure Laterality Date   CHOLECYSTECTOMY  2007   Dr. Verdene Lennert   CLAVICLE EXCISION  1986   INTRAMEDULLARY (IM) NAIL INTERTROCHANTERIC Left 04/09/2020   Procedure: LEFT INTERTROCHANTERIC INTRAMEDULLARY (IM) NAIL;  Surgeon: Leandrew Koyanagi, MD;  Location: [facility];  Service: Orthopedics;  Laterality: Left;   MOHS SURGERY     past 10 years chest & legs   OPEN REDUCTION INTERNAL FIXATION (ORIF) DISTAL RADIAL FRACTURE Left 04/09/2020   Procedure: CLOSED REDUCTION LEFT DISTAL RADIUS FRACTURE;  Surgeon: Leandrew Koyanagi, MD;  Location: [facility];  Service: Orthopedics;  Laterality: Left;    Allergies  Allergen Reactions   Metformin And Related Diarrhea    Outpatient Encounter Medications as of 10/23/2021  Medication Sig   acetaminophen (TYLENOL) 325 MG tablet Take 650 mg by mouth 3 (three) times daily as needed.   atorvastatin (LIPITOR) 10 MG tablet Take 10 mg by mouth daily.   Cholecalciferol (VITAMIN D3) 2000 units TABS Take 1 tablet by mouth daily.    DIMETHICONE, TOPICAL, (CERAVE BABY) 1 % LOTN Apply 1 application topically daily.   divalproex (DEPAKOTE) 125 MG DR tablet Take 125 mg by mouth daily.   docusate sodium (COLACE) 100 MG capsule Take 100 mg by mouth daily.   Emollient (CERAVE) LOTN Apply topically every other day. Apply to shoulders, back, and chest   hydrocortisone cream 1 % Apply 1 application. topically 2 (two) times daily. Apply to back, abdomen, and  chest   ipratropium-albuterol (DUONEB) 0.5-2.5 (3) MG/3ML SOLN Take 3 mLs by nebulization every 6 (six) hours as needed.   levothyroxine (SYNTHROID) 75 MCG tablet TAKE 1 TABLET (75 MCG TOTAL) BY MOUTH DAILY BEFORE BREAKFAST.   LORazepam (ATIVAN) 0.5 MG tablet Take 1 tablet (0.5 mg total) by mouth 2 (two) times daily as needed.   losartan (COZAAR) 25 MG tablet Take 12.5 mg by mouth daily. Hold if SBP<110   metoprolol tartrate (LOPRESSOR) 25 MG tablet Take 12.5 mg by mouth 2 (two) times daily.   ondansetron (ZOFRAN) 4 MG  tablet Take 1 tablet (4 mg total) by mouth 2 (two) times daily as needed for nausea or vomiting.   polyethylene glycol (MIRALAX / GLYCOLAX) 17 g packet Take 17 g by mouth daily as needed for moderate constipation.   QUEtiapine (SEROQUEL) 25 MG tablet Take 37.5 mg by mouth at bedtime.   sertraline (ZOLOFT) 50 MG tablet Take 50 mg by mouth daily.   zinc oxide 20 % ointment Apply 1 application topically as needed for irritation.   No facility-administered encounter medications on file as of 10/23/2021.    Review of Systems  Unable to perform ROS: Dementia    Immunization History  Administered Date(s) Administered   DTaP 07/20/2013   Influenza, High Dose Seasonal PF 12/07/2016   Influenza,inj,Quad PF,6+ Mos 12/01/2017   Influenza-Unspecified 11/20/2014, 12/12/2015, 11/20/2019   Moderna SARS-COV2 Booster Vaccination 08/06/2020   Moderna Sars-Covid-2 Vaccination 03/05/2019, 04/02/2019   PFIZER(Purple Top)SARS-COV-2 Vaccination 04/01/2020, 08/06/2020, 11/19/2020   PPD Test 08/06/2014   Pneumococcal Conjugate-13 11/07/2013   Pneumococcal Polysaccharide-23 03/22/2017   Tetanus 03/02/2015   Zoster Recombinat (Shingrix) 06/07/2017, 08/26/2017   Zoster, Live 03/01/2006   Pertinent  Health Maintenance Due  Topic Date Due   OPHTHALMOLOGY EXAM  Never done   FOOT EXAM  07/23/2021   INFLUENZA VACCINE  09/29/2021   HEMOGLOBIN A1C  02/20/2022   DEXA SCAN  Completed      04/13/2020    8:56 AM 04/13/2020    8:00 PM 04/14/2020    7:47 AM 09/12/2020   12:07 PM 09/28/2021   10:30 AM  Fall Risk  Falls in the past year?    1 1  Was there an injury with Fall?    1 0  Fall Risk Category Calculator    3 2  Fall Risk Category    High Moderate  Patient Fall Risk Level High fall risk High fall risk High fall risk  Moderate fall risk  Patient at Risk for Falls Due to    History of fall(s);Impaired balance/gait;Impaired mobility;Mental status change History of fall(s);Impaired balance/gait;Impaired  mobility  Fall risk Follow up    Falls evaluation completed;Education provided;Falls prevention discussed Falls evaluation completed;Education provided;Falls prevention discussed   Functional Status Survey:    Vitals:   10/23/21 1118  BP: 102/60  Pulse: 90  Resp: 18  Temp: (!) 97.3 F (36.3 C)  SpO2: 95%  Weight: 140 lb (63.5 kg)  Height: 5' (1.524 m)   Body mass index is 27.34 kg/m. Physical Exam Vitals reviewed.  Constitutional:      General: She is not in acute distress. HENT:     Head: Normocephalic.  Eyes:     General:        Right eye: No discharge.        Left eye: No discharge.  Cardiovascular:     Rate and Rhythm: Normal rate and regular rhythm.     Pulses: Normal pulses.  Heart sounds: Normal heart sounds.  Pulmonary:     Effort: Pulmonary effort is normal. No respiratory distress.     Breath sounds: Normal breath sounds. No wheezing.  Abdominal:     General: Bowel sounds are normal. There is no distension.     Palpations: Abdomen is soft.     Tenderness: There is no abdominal tenderness.  Genitourinary:    Comments: Increased erythema to vaginal folds, no discharge present Musculoskeletal:     Cervical back: Neck supple.     Right lower leg: No edema.     Left lower leg: No edema.  Skin:    General: Skin is warm and dry.     Capillary Refill: Capillary refill takes less than 2 seconds.  Neurological:     General: No focal deficit present.     Mental Status: She is alert. Mental status is at baseline.     Motor: Weakness present.     Gait: Gait abnormal.     Comments: wheelchair  Psychiatric:        Mood and Affect: Mood normal.        Behavior: Behavior normal.     Labs reviewed: Recent Labs    11/21/20 0000 11/21/20 0110 05/11/21 0000  NA 139 141 142  K 3.9 3.7 4.0  CL 105 105 107  CO2 26* 26* 27*  BUN '20 20 20  '$ CREATININE 0.7 0.7 0.5  CALCIUM 8.6* 8.4* 8.8   Recent Labs    11/21/20 0000 11/21/20 0110 05/11/21 0000  AST 15  13 10*  ALT '9 8 8  '$ ALKPHOS 63 65 76  ALBUMIN 3.3* 3.2* 3.2*   Recent Labs    11/21/20 0000 11/21/20 0110 05/11/21 0000  WBC 6.8 7.1 8.8  NEUTROABS 4,821.00 5,730.00 5,262.00  HGB 10.6* 10.7* 10.6*  HCT 34* 34* 34*  PLT 218 220 301   Lab Results  Component Value Date   TSH 0.69 11/21/2020   Lab Results  Component Value Date   HGBA1C 5.6 08/21/2021   Lab Results  Component Value Date   CHOL 96 11/21/2020   HDL 33 (A) 11/21/2020   LDLCALC 45 11/21/2020   TRIG 94 11/21/2020   CHOLHDL 3.0 12/13/2019    Significant Diagnostic Results in last 30 days:  No results found.  Assessment/Plan 1. Vaginal yeast infection - increased erythema to vaginal folds, no discharge - start Diflucan 150 mg po once  2. Agitation due to dementia ([facility]) - increased agitation/anxiety in evening - Seroquel increased to 37.5 mg at 4 pm - cont Depakote  3. Vascular dementia with behavior disturbance ([facility]) - see above - ambulates with wheelchair, able to feed self, talking less - cont skilled nursing  4. Diabetes mellitus type 2 in nonobese (HCC) - A1c controlled - cont carb mod diet   Family/ staff Communication: plan discussed with patient and nurse  Labs/tests ordered:  none

## 2021-10-27 ENCOUNTER — Other Ambulatory Visit: Payer: Self-pay | Admitting: Adult Health

## 2021-10-27 DIAGNOSIS — F01518 Vascular dementia, unspecified severity, with other behavioral disturbance: Secondary | ICD-10-CM

## 2021-10-27 DIAGNOSIS — F03911 Unspecified dementia, unspecified severity, with agitation: Secondary | ICD-10-CM

## 2021-10-27 MED ORDER — LORAZEPAM 0.5 MG PO TABS: 0.5000 mg | ORAL_TABLET | Freq: Two times a day (BID) | ORAL | 0 refills | Status: DC | PRN

## 2021-11-05 DIAGNOSIS — D649 Anemia, unspecified: Secondary | ICD-10-CM | POA: Diagnosis not present

## 2021-11-05 DIAGNOSIS — E119 Type 2 diabetes mellitus without complications: Secondary | ICD-10-CM | POA: Diagnosis not present

## 2021-11-05 DIAGNOSIS — E039 Hypothyroidism, unspecified: Secondary | ICD-10-CM | POA: Diagnosis not present

## 2021-11-05 LAB — BASIC METABOLIC PANEL
BUN: 23 — AB (ref 4–21)
CO2: 31 — AB (ref 13–22)
Chloride: 101 (ref 99–108)
Creatinine: 0.7 (ref 0.5–1.1)
Glucose: 95
Potassium: 4.2 mEq/L (ref 3.5–5.1)
Sodium: 138 (ref 137–147)

## 2021-11-05 LAB — HEPATIC FUNCTION PANEL
ALT: 13 U/L (ref 7–35)
AST: 14 (ref 13–35)
Alkaline Phosphatase: 96 (ref 25–125)
Bilirubin, Total: 0.3

## 2021-11-05 LAB — COMPREHENSIVE METABOLIC PANEL
Albumin: 3.9 (ref 3.5–5.0)
Calcium: 9.4 (ref 8.7–10.7)
Globulin: 2.9
eGFR: 82

## 2021-11-05 LAB — TSH: TSH: 2.88 (ref 0.41–5.90)

## 2021-11-05 LAB — CBC AND DIFFERENTIAL
HCT: 40 (ref 36–46)
Hemoglobin: 13 (ref 12.0–16.0)
Platelets: 257 10*3/uL (ref 150–400)
WBC: 8.5

## 2021-11-05 LAB — CBC: RBC: 4.85 (ref 3.87–5.11)

## 2021-11-05 LAB — HEMOGLOBIN A1C: Hemoglobin A1C: 5.9

## 2021-11-11 ENCOUNTER — Encounter: Payer: Self-pay | Admitting: Orthopedic Surgery

## 2021-11-11 ENCOUNTER — Non-Acute Institutional Stay (SKILLED_NURSING_FACILITY): Payer: Medicare Other | Admitting: Orthopedic Surgery

## 2021-11-11 ENCOUNTER — Other Ambulatory Visit: Payer: Self-pay | Admitting: Orthopedic Surgery

## 2021-11-11 DIAGNOSIS — F01518 Vascular dementia, unspecified severity, with other behavioral disturbance: Secondary | ICD-10-CM | POA: Diagnosis not present

## 2021-11-11 DIAGNOSIS — F03911 Unspecified dementia, unspecified severity, with agitation: Secondary | ICD-10-CM

## 2021-11-11 DIAGNOSIS — U071 COVID-19: Secondary | ICD-10-CM

## 2021-11-11 MED ORDER — NIRMATRELVIR/RITONAVIR (PAXLOVID)TABLET: 3.0000 | ORAL_TABLET | Freq: Two times a day (BID) | ORAL | 0 refills | Status: AC

## 2021-11-11 MED ORDER — ALPRAZOLAM 0.5 MG PO TABS: 0.5000 mg | ORAL_TABLET | Freq: Two times a day (BID) | ORAL | 0 refills | Status: DC | PRN

## 2021-11-11 NOTE — Progress Notes (Addendum)
Location:  [facility] Room Number: 39/A Place of Service:  SNF [phone]) Provider:  Yvonna Alanis, NP   [name], MD  Patient Care Team: [name], MD as PCP - General (Internal Medicine)  Extended Emergency Contact Information Primary Emergency Contact: Harris,Margaret Address: [address]          [place], [place] [address] [name] of [place] Phone: [phone] Mobile Phone: [phone] Relation: Daughter  Code Status:  DNR Goals of care: Advanced Directive information    10/15/2021   10:38 AM  Advanced Directives  Does Patient Have a Medical Advance Directive? Yes  Type of Paramedic of [place];Living will;Out of facility DNR (pink MOST or yellow form)  Does patient want to make changes to medical advance directive? No - Patient declined  Copy of [place] in Chart? Yes - validated most recent copy scanned in chart (See row information)  Pre-existing out of facility DNR order (yellow form or pink MOST form) Yellow form placed in chart (order not valid for inpatient use)     Chief Complaint  Patient presents with   Acute Visit    Covid    HPI:  Pt is a 88 y.o. female seen today for acute visit due to covid.   She currently resides on the skilled nursing unit at [facility]. Past medical includes: HTN, constipation, T2DM, hypothyroidism, OA, urinary retention, vascular dementia, depression and anxiety.    Poor historian due to dementia. 09/12 she tested positive for covid. Symptoms include malaise, nasal congestion, fever and cough. Temperature 101.1, resolved with tylenol. She is eating and drinking well. Afebrile today. Vitals stable. HPOA gave consent to start paxlovid.   Dementia- followed by palliative, BIMS 6/15 10/26/2021, was 6/15 08/19/2021, CT head 04/2020 age related atrophy and chronic small vessel ischemia, sleeping more, some agitation in late afternoon, remains on  Depakote, Seroquel and Ativan  Past Medical History:  Diagnosis Date   Hearing loss    History of fracture of clavicle    Hypothyroidism    Insomnia    Tinnitus    Past Surgical History:  Procedure Laterality Date   CHOLECYSTECTOMY  2007   Dr. Verdene Lennert   CLAVICLE EXCISION  1986   INTRAMEDULLARY (IM) NAIL INTERTROCHANTERIC Left 04/09/2020   Procedure: LEFT INTERTROCHANTERIC INTRAMEDULLARY (IM) NAIL;  Surgeon: Leandrew Koyanagi, MD;  Location: [facility];  Service: Orthopedics;  Laterality: Left;   MOHS SURGERY     past 10 years chest & legs   OPEN REDUCTION INTERNAL FIXATION (ORIF) DISTAL RADIAL FRACTURE Left 04/09/2020   Procedure: CLOSED REDUCTION LEFT DISTAL RADIUS FRACTURE;  Surgeon: Leandrew Koyanagi, MD;  Location: [facility];  Service: Orthopedics;  Laterality: Left;    Allergies  Allergen Reactions   Metformin And Related Diarrhea    Outpatient Encounter Medications as of 11/11/2021  Medication Sig   acetaminophen (TYLENOL) 325 MG tablet Take 650 mg by mouth 3 (three) times daily as needed.   ALPRAZolam (XANAX) 0.5 MG tablet Take 1 tablet (0.5 mg total) by mouth 2 (two) times daily as needed for anxiety.   atorvastatin (LIPITOR) 10 MG tablet Take 10 mg by mouth daily.   Cholecalciferol (VITAMIN D3) 2000 units TABS Take 1 tablet by mouth daily.    DIMETHICONE, TOPICAL, (CERAVE BABY) 1 % LOTN Apply 1 application topically daily.   divalproex (DEPAKOTE) 125 MG DR tablet Take 125 mg by mouth daily.   docusate sodium (COLACE)  100 MG capsule Take 100 mg by mouth daily.   Emollient (CERAVE) LOTN Apply topically every other day. Apply to shoulders, back, and chest   hydrocortisone cream 1 % Apply 1 application. topically 2 (two) times daily. Apply to back, abdomen, and chest   ipratropium-albuterol (DUONEB) 0.5-2.5 (3) MG/3ML SOLN Take 3 mLs by nebulization every 6 (six) hours as needed.   levothyroxine (SYNTHROID) 75 MCG tablet TAKE 1 TABLET (75 MCG TOTAL) BY MOUTH DAILY BEFORE BREAKFAST.   LORazepam  (ATIVAN) 0.5 MG tablet Take 1 tablet (0.5 mg total) by mouth 2 (two) times daily as needed.   losartan (COZAAR) 25 MG tablet Take 12.5 mg by mouth daily. Hold if SBP<110   metoprolol tartrate (LOPRESSOR) 25 MG tablet Take 12.5 mg by mouth 2 (two) times daily.   ondansetron (ZOFRAN) 4 MG tablet Take 1 tablet (4 mg total) by mouth 2 (two) times daily as needed for nausea or vomiting.   polyethylene glycol (MIRALAX / GLYCOLAX) 17 g packet Take 17 g by mouth daily as needed for moderate constipation.   QUEtiapine (SEROQUEL) 25 MG tablet Take 37.5 mg by mouth at bedtime.   sertraline (ZOLOFT) 50 MG tablet Take 50 mg by mouth daily.   zinc oxide 20 % ointment Apply 1 application topically as needed for irritation.   No facility-administered encounter medications on file as of 11/11/2021.    Review of Systems  Unable to perform ROS: Dementia    Immunization History  Administered Date(s) Administered   DTaP 07/20/2013   Influenza, High Dose Seasonal PF 12/07/2016   Influenza,inj,Quad PF,6+ Mos 12/01/2017   Influenza-Unspecified 11/20/2014, 12/12/2015, 11/20/2019   Moderna SARS-COV2 Booster Vaccination 08/06/2020   Moderna Sars-Covid-2 Vaccination 03/05/2019, 04/02/2019   PFIZER(Purple Top)SARS-COV-2 Vaccination 04/01/2020, 08/06/2020, 11/19/2020   PPD Test 08/06/2014   Pneumococcal Conjugate-13 11/07/2013   Pneumococcal Polysaccharide-23 03/22/2017   Tetanus 03/02/2015   Zoster Recombinat (Shingrix) 06/07/2017, 08/26/2017   Zoster, Live 03/01/2006   Pertinent  Health Maintenance Due  Topic Date Due   OPHTHALMOLOGY EXAM  Never done   FOOT EXAM  07/23/2021   INFLUENZA VACCINE  09/29/2021   HEMOGLOBIN A1C  02/20/2022   DEXA SCAN  Completed      04/13/2020    8:56 AM 04/13/2020    8:00 PM 04/14/2020    7:47 AM 09/12/2020   12:07 PM 09/28/2021   10:30 AM  Fall Risk  Falls in the past year?    1 1  Was there an injury with Fall?    1 0  Fall Risk Category Calculator    3 2  Fall Risk  Category    High Moderate  Patient Fall Risk Level High fall risk High fall risk High fall risk  Moderate fall risk  Patient at Risk for Falls Due to    History of fall(s);Impaired balance/gait;Impaired mobility;Mental status change History of fall(s);Impaired balance/gait;Impaired mobility  Fall risk Follow up    Falls evaluation completed;Education provided;Falls prevention discussed Falls evaluation completed;Education provided;Falls prevention discussed   Functional Status Survey:    Vitals:   11/11/21 1402  BP: (!) 140/60  Pulse: 93  Resp: 18  Temp: 98.6 F (37 C)  SpO2: 94%  Weight: 140 lb 4.8 oz (63.6 kg)  Height: 5' (1.524 m)   Body mass index is 27.4 kg/m. Physical Exam Vitals reviewed.  Constitutional:      General: She is not in acute distress. HENT:     Head: Normocephalic.     Right Ear:  There is no impacted cerumen.     Left Ear: There is no impacted cerumen.     Nose: Rhinorrhea present.     Mouth/Throat:     Mouth: Mucous membranes are moist.     Pharynx: No posterior oropharyngeal erythema.  Eyes:     General:        Right eye: No discharge.        Left eye: No discharge.  Cardiovascular:     Rate and Rhythm: Normal rate and regular rhythm.     Pulses: Normal pulses.     Heart sounds: Normal heart sounds.  Pulmonary:     Effort: Pulmonary effort is normal. No respiratory distress.     Breath sounds: Normal breath sounds. No wheezing, rhonchi or rales.  Abdominal:     General: Bowel sounds are normal. There is no distension.     Palpations: Abdomen is soft.     Tenderness: There is no abdominal tenderness.  Musculoskeletal:     Cervical back: Neck supple.     Right lower leg: No edema.     Left lower leg: No edema.  Lymphadenopathy:     Cervical: No cervical adenopathy.  Skin:    General: Skin is warm and dry.     Capillary Refill: Capillary refill takes less than 2 seconds.  Neurological:     General: No focal deficit present.     Mental  Status: She is alert. Mental status is at baseline.     Motor: Weakness present.     Gait: Gait abnormal.     Comments: wheelchair  Psychiatric:        Mood and Affect: Mood normal.        Behavior: Behavior normal.     Labs reviewed: Recent Labs    11/21/20 0000 11/21/20 0110 05/11/21 0000  NA 139 141 142  K 3.9 3.7 4.0  CL 105 105 107  CO2 26* 26* 27*  BUN '20 20 20  '$ CREATININE 0.7 0.7 0.5  CALCIUM 8.6* 8.4* 8.8   Recent Labs    11/21/20 0000 11/21/20 0110 05/11/21 0000  AST 15 13 10*  ALT '9 8 8  '$ ALKPHOS 63 65 76  ALBUMIN 3.3* 3.2* 3.2*   Recent Labs    11/21/20 0000 11/21/20 0110 05/11/21 0000  WBC 6.8 7.1 8.8  NEUTROABS 4,821.00 5,730.00 5,262.00  HGB 10.6* 10.7* 10.6*  HCT 34* 34* 34*  PLT 218 220 301   Lab Results  Component Value Date   TSH 0.69 11/21/2020   Lab Results  Component Value Date   HGBA1C 5.6 08/21/2021   Lab Results  Component Value Date   CHOL 96 11/21/2020   HDL 33 (A) 11/21/2020   LDLCALC 45 11/21/2020   TRIG 94 11/21/2020   CHOLHDL 3.0 12/13/2019    Significant Diagnostic Results in last 30 days:  No results found.  Assessment/Plan 1. COVID-19 - 09/12 tested positive - symptoms: malaise, fever, nasal congestion, cough - lung sounds clear, some nasal congestion on exam - Start paxlovid 09/13 - Hold Seroquel while on paxlovid - cont ativan prn  2. Vascular dementia with behavior disturbance ([place]) - see above - no behaviors - ambulates in w/c, able to feed self - cont skilled nursing    Family/ staff Communication: plan discussed with patient and nurse  Labs/tests ordered:  none

## 2021-11-11 NOTE — Addendum Note (Signed)
Addended byWindell Moulding E on: 11/11/2021 02:26 PM   Modules accepted: Orders

## 2021-11-11 NOTE — Progress Notes (Signed)
09/12 covid positive. Paxlovid started per family request. Pharmacy reports stopping Seroquel while on Paxlovid. Will order xanax prn for increased agitation or anxiety.

## 2021-11-18 ENCOUNTER — Encounter: Payer: Self-pay | Admitting: Orthopedic Surgery

## 2021-11-18 ENCOUNTER — Non-Acute Institutional Stay (SKILLED_NURSING_FACILITY): Payer: Medicare Other | Admitting: Orthopedic Surgery

## 2021-11-18 DIAGNOSIS — F418 Other specified anxiety disorders: Secondary | ICD-10-CM

## 2021-11-18 DIAGNOSIS — R7303 Prediabetes: Secondary | ICD-10-CM

## 2021-11-18 DIAGNOSIS — E782 Mixed hyperlipidemia: Secondary | ICD-10-CM | POA: Diagnosis not present

## 2021-11-18 DIAGNOSIS — U071 COVID-19: Secondary | ICD-10-CM

## 2021-11-18 DIAGNOSIS — E039 Hypothyroidism, unspecified: Secondary | ICD-10-CM

## 2021-11-18 DIAGNOSIS — I10 Essential (primary) hypertension: Secondary | ICD-10-CM

## 2021-11-18 DIAGNOSIS — F01518 Vascular dementia, unspecified severity, with other behavioral disturbance: Secondary | ICD-10-CM

## 2021-11-18 NOTE — Progress Notes (Signed)
Location:  [place] [facility] Number: 39/A Place of Service:  SNF [phone]) Provider:  Yvonna Alanis, NP   [name], MD  Patient Care Team: [name], MD as PCP - General (Internal Medicine)  Extended Emergency Contact Information Primary Emergency Contact: Harris,Margaret Address: [address]          [place], [place] [address] [name] of [place] Phone: [phone] Mobile Phone: [phone] Relation: Daughter  Code Status:  DNR Goals of care: Advanced Directive information    10/15/2021   10:38 AM  Advanced Directives  Does Patient Have a Medical Advance Directive? Yes  Type of Paramedic of [place];Living will;Out of facility DNR (pink MOST or yellow form)  Does patient want to make changes to medical advance directive? No - Patient declined  Copy of [place] in Chart? Yes - validated most recent copy scanned in chart (See row information)  Pre-existing out of facility DNR order (yellow form or pink MOST form) Yellow form placed in chart (order not valid for inpatient use)     Chief Complaint  Patient presents with   Medical Management of Chronic Issues    HPI:  Pt is a 88 y.o. female seen today for medical management of chronic diseases.    She currently resides on the skilled nursing unit at [facility]. Past medical includes: HTN, constipation, T2DM, hypothyroidism, OA, urinary retention, vascular dementia, depression and anxiety.    Covid- 09/12 tested positive, completed paxlovid, improved symptoms T2DM- a1c 5.9 11/05/2021, blood sugars averaging 100-150's, no hypoglycemic events, remains on carb mod diet Dementia- followed by palliative, BIMS 6/15 10/26/2021, was 6/15 08/13/2021, CT head 04/2020 age related atrophy and chronic small vessel ischemia, sleeping more, some agitation in late afternoon, remains on Depakote, Seroquel and Ativan HTN- BUN/creat 23/0.70 11/05/2021, remains  on metoprolol and losartan HLD- LDL 45 11/20/2020, remains on Lipitor Hypothyroidism- TSH 2.88 11/05/2021, remains on levothyroxine  Depression/anxiety- no mood changes or panic attacks, good family support, remains on Zoloft, Na+ 138 11/05/2021  Recent blood pressures:  09/19- 110/60  09/15- 142/62  09/14- 145/70  Recent weights:  09/01- 140.3 lbs  08/02- 125.1 lbs  07/01- 131.7 lbs   Past Medical History:  Diagnosis Date   Hearing loss    History of fracture of clavicle    Hypothyroidism    Insomnia    Tinnitus    Past Surgical History:  Procedure Laterality Date   CHOLECYSTECTOMY  2007   Dr. Verdene Lennert   CLAVICLE EXCISION  1986   INTRAMEDULLARY (IM) NAIL INTERTROCHANTERIC Left 04/09/2020   Procedure: LEFT INTERTROCHANTERIC INTRAMEDULLARY (IM) NAIL;  Surgeon: Leandrew Koyanagi, MD;  Location: [place];  Service: Orthopedics;  Laterality: Left;   MOHS SURGERY     past 10 years chest & legs   OPEN REDUCTION INTERNAL FIXATION (ORIF) DISTAL RADIAL FRACTURE Left 04/09/2020   Procedure: CLOSED REDUCTION LEFT DISTAL RADIUS FRACTURE;  Surgeon: Leandrew Koyanagi, MD;  Location: [place];  Service: Orthopedics;  Laterality: Left;    Allergies  Allergen Reactions   Metformin And Related Diarrhea    Outpatient Encounter Medications as of 11/18/2021  Medication Sig   acetaminophen (TYLENOL) 325 MG tablet Take 650 mg by mouth 3 (three) times daily as needed.   atorvastatin (LIPITOR) 10 MG tablet Take 10 mg by mouth daily.   Cholecalciferol (VITAMIN D3) 2000 units TABS Take 1 tablet by mouth daily.    DIMETHICONE, TOPICAL, (CERAVE  BABY) 1 % LOTN Apply 1 application topically daily.   divalproex (DEPAKOTE) 125 MG DR tablet Take 125 mg by mouth daily.   docusate sodium (COLACE) 100 MG capsule Take 100 mg by mouth daily.   Emollient (CERAVE) LOTN Apply topically every other day. Apply to shoulders, back, and chest   hydrocortisone cream 1 % Apply 1 application. topically 2 (two) times daily. Apply to  back, abdomen, and chest   ipratropium-albuterol (DUONEB) 0.5-2.5 (3) MG/3ML SOLN Take 3 mLs by nebulization every 6 (six) hours as needed.   levothyroxine (SYNTHROID) 75 MCG tablet TAKE 1 TABLET (75 MCG TOTAL) BY MOUTH DAILY BEFORE BREAKFAST.   LORazepam (ATIVAN) 0.5 MG tablet Take 1 tablet (0.5 mg total) by mouth 2 (two) times daily as needed.   losartan (COZAAR) 25 MG tablet Take 12.5 mg by mouth daily. Hold if SBP<110   metoprolol tartrate (LOPRESSOR) 25 MG tablet Take 12.5 mg by mouth 2 (two) times daily.   ondansetron (ZOFRAN) 4 MG tablet Take 1 tablet (4 mg total) by mouth 2 (two) times daily as needed for nausea or vomiting.   polyethylene glycol (MIRALAX / GLYCOLAX) 17 g packet Take 17 g by mouth daily as needed for moderate constipation.   QUEtiapine (SEROQUEL) 25 MG tablet Take 37.5 mg by mouth at bedtime. Hold 09/13- 09/18 while on paxlovid   sertraline (ZOLOFT) 50 MG tablet Take 50 mg by mouth daily.   zinc oxide 20 % ointment Apply 1 application topically as needed for irritation.   No facility-administered encounter medications on file as of 11/18/2021.    Review of Systems  Unable to perform ROS: Dementia    Immunization History  Administered Date(s) Administered   DTaP 07/20/2013   Influenza, High Dose Seasonal PF 12/07/2016   Influenza,inj,Quad PF,6+ Mos 12/01/2017   Influenza-Unspecified 11/20/2014, 12/12/2015, 11/20/2019   Moderna SARS-COV2 Booster Vaccination 08/06/2020   Moderna Sars-Covid-2 Vaccination 03/05/2019, 04/02/2019   PFIZER(Purple Top)SARS-COV-2 Vaccination 04/01/2020, 08/06/2020, 11/19/2020   PPD Test 08/06/2014   Pneumococcal Conjugate-13 11/07/2013   Pneumococcal Polysaccharide-23 03/22/2017   Tetanus 03/02/2015   Zoster Recombinat (Shingrix) 06/07/2017, 08/26/2017   Zoster, Live 03/01/2006   Pertinent  Health Maintenance Due  Topic Date Due   OPHTHALMOLOGY EXAM  Never done   FOOT EXAM  07/23/2021   INFLUENZA VACCINE  09/29/2021   HEMOGLOBIN  A1C  02/20/2022   DEXA SCAN  Completed      04/13/2020    8:56 AM 04/13/2020    8:00 PM 04/14/2020    7:47 AM 09/12/2020   12:07 PM 09/28/2021   10:30 AM  Fall Risk  Falls in the past year?    1 1  Was there an injury with Fall?    1 0  Fall Risk Category Calculator    3 2  Fall Risk Category    High Moderate  Patient Fall Risk Level High fall risk High fall risk High fall risk  Moderate fall risk  Patient at Risk for Falls Due to    History of fall(s);Impaired balance/gait;Impaired mobility;Mental status change History of fall(s);Impaired balance/gait;Impaired mobility  Fall risk Follow up    Falls evaluation completed;Education provided;Falls prevention discussed Falls evaluation completed;Education provided;Falls prevention discussed   Functional Status Survey:    Vitals:   11/18/21 1437  BP: 110/60  Pulse: 79  Resp: 16  Temp: (!) 96.5 F (35.8 C)  SpO2: 98%  Weight: 140 lb 4.8 oz (63.6 kg)  Height: 5' (1.524 m)   Body mass index  is 27.4 kg/m. Physical Exam Vitals reviewed.  Constitutional:      General: She is not in acute distress. HENT:     Head: Normocephalic.     Right Ear: There is no impacted cerumen.     Left Ear: There is no impacted cerumen.     Nose: Nose normal.     Mouth/Throat:     Mouth: Mucous membranes are moist.  Eyes:     General:        Right eye: No discharge.        Left eye: No discharge.  Cardiovascular:     Rate and Rhythm: Normal rate and regular rhythm.     Pulses:          Dorsalis pedis pulses are 1+ on the right side and 1+ on the left side.       Posterior tibial pulses are 1+ on the right side and 1+ on the left side.     Heart sounds: Normal heart sounds.  Pulmonary:     Effort: Pulmonary effort is normal. No respiratory distress.     Breath sounds: Normal breath sounds. No wheezing.  Abdominal:     General: Bowel sounds are normal. There is no distension.     Palpations: Abdomen is soft.     Tenderness: There is no  abdominal tenderness.  Musculoskeletal:     Cervical back: Neck supple.     Right lower leg: No edema.     Left lower leg: No edema.  Feet:     Right foot:     Protective Sensation: 9 sites tested.  9 sites sensed.     Skin integrity: Skin integrity normal.     Toenail Condition: Fungal disease present.    Left foot:     Protective Sensation: 9 sites tested.  9 sites sensed.     Skin integrity: Skin integrity normal.     Toenail Condition: Fungal disease present. Skin:    General: Skin is warm and dry.     Capillary Refill: Capillary refill takes less than 2 seconds.  Neurological:     General: No focal deficit present.     Mental Status: She is alert. Mental status is at baseline.     Motor: Weakness present.     Gait: Gait abnormal.     Comments: wheelchair  Psychiatric:        Mood and Affect: Mood normal.        Behavior: Behavior normal.     Comments: Very pleasant, follows commands, alert to self/familiar face     Labs reviewed: Recent Labs    11/21/20 0000 11/21/20 0110 05/11/21 0000  NA 139 141 142  K 3.9 3.7 4.0  CL 105 105 107  CO2 26* 26* 27*  BUN '20 20 20  '$ CREATININE 0.7 0.7 0.5  CALCIUM 8.6* 8.4* 8.8   Recent Labs    11/21/20 0000 11/21/20 0110 05/11/21 0000  AST 15 13 10*  ALT '9 8 8  '$ ALKPHOS 63 65 76  ALBUMIN 3.3* 3.2* 3.2*   Recent Labs    11/21/20 0000 11/21/20 0110 05/11/21 0000  WBC 6.8 7.1 8.8  NEUTROABS 4,821.00 5,730.00 5,262.00  HGB 10.6* 10.7* 10.6*  HCT 34* 34* 34*  PLT 218 220 301   Lab Results  Component Value Date   TSH 0.69 11/21/2020   Lab Results  Component Value Date   HGBA1C 5.6 08/21/2021   Lab Results  Component Value Date   CHOL  96 11/21/2020   HDL 33 (A) 11/21/2020   LDLCALC 45 11/21/2020   TRIG 94 11/21/2020   CHOLHDL 3.0 12/13/2019    Significant Diagnostic Results in last 30 days:  No results found.  Assessment/Plan 1. COVID-19 - 09/12 tested positive - completed paxlovid - no long term  symptoms  2. Prediabetes - A1c stable - diet controlled  3. Vascular dementia with behavior disturbance ([place]) - behaviors in afternoon - ambulates with w/c - cont Depakote, Seroquel and ativan - cont skilled nursing care  4. Primary hypertension - controlled - cont losartan  5. Mixed hyperlipidemia - cont statin  6. Acquired hypothyroidism - TSH stable - cont levothyroxine  7. Depression with anxiety - no mood changes - cont Zoloft and ativan prn    Family/ staff Communication: plan discussed with patient and nurse  Labs/tests ordered:  re-weight patient

## 2021-12-25 ENCOUNTER — Encounter: Payer: Self-pay | Admitting: Orthopedic Surgery

## 2021-12-25 ENCOUNTER — Non-Acute Institutional Stay (SKILLED_NURSING_FACILITY): Payer: Medicare Other | Admitting: Orthopedic Surgery

## 2021-12-25 DIAGNOSIS — H6121 Impacted cerumen, right ear: Secondary | ICD-10-CM | POA: Diagnosis not present

## 2021-12-25 DIAGNOSIS — I10 Essential (primary) hypertension: Secondary | ICD-10-CM | POA: Diagnosis not present

## 2021-12-25 DIAGNOSIS — R7303 Prediabetes: Secondary | ICD-10-CM | POA: Diagnosis not present

## 2021-12-25 DIAGNOSIS — F01518 Vascular dementia, unspecified severity, with other behavioral disturbance: Secondary | ICD-10-CM

## 2021-12-25 DIAGNOSIS — E782 Mixed hyperlipidemia: Secondary | ICD-10-CM

## 2021-12-25 DIAGNOSIS — E039 Hypothyroidism, unspecified: Secondary | ICD-10-CM | POA: Diagnosis not present

## 2021-12-25 DIAGNOSIS — F418 Other specified anxiety disorders: Secondary | ICD-10-CM | POA: Diagnosis not present

## 2021-12-25 MED ORDER — DEBROX 6.5 % OT SOLN: 5.0000 [drp] | Freq: Two times a day (BID) | OTIC | 0 refills | Status: AC

## 2021-12-25 NOTE — Progress Notes (Signed)
Location:  [place] Room Number: 39/A Place of Service:  SNF [phone]) Provider:  Yvonna Alanis, NP   [name], MD  Patient Care Team: [name], MD as PCP - General (Internal Medicine)  Extended Emergency Contact Information Primary Emergency Contact: Harris,Margaret Address: [address]          [place], [place] [address] [name] of [place] Phone: [phone] Mobile Phone: [phone] Relation: Daughter  Code Status:  DNR Goals of care: Advanced Directive information    10/15/2021   10:38 AM  Advanced Directives  Does Patient Have a Medical Advance Directive? Yes  Type of Paramedic of [place];Living will;Out of facility DNR (pink MOST or yellow form)  Does patient want to make changes to medical advance directive? No - Patient declined  Copy of [place] in Chart? Yes - validated most recent copy scanned in chart (See row information)  Pre-existing out of facility DNR order (yellow form or pink MOST form) Yellow form placed in chart (order not valid for inpatient use)     Chief Complaint  Patient presents with   Medical Management of Chronic Issues    HPI:  Pt is a 88 y.o. female seen today for medical management of chronic diseases.   She currently resides on the skilled nursing unit at [facility]. Past medical includes: HTN, constipation, T2DM, hypothyroidism, OA, urinary retention, vascular dementia, depression and anxiety.   T2DM- a1c 5.9 11/05/2021, blood sugars averaging 120-130's, no hypoglycemic events, remains on carb mod diet Dementia- followed by palliative, BIMS 6/15 10/26/2021, was 6/15 08/13/2021, CT head 04/2020 age related atrophy and chronic small vessel ischemia, some agitation in late afternoon, remains on Depakote, Seroquel and Ativan prn HTN- BUN/creat 23/0.70 11/05/2021, remains on metoprolol and losartan HLD- LDL 45 11/20/2020, remains on  Lipitor Hypothyroidism- TSH 2.88 11/05/2021, remains on levothyroxine  Depression/anxiety- no mood changes or panic attacks, good family support, remains on Zoloft, Na+ 138 11/05/2021   No recent falls or injuries. Ambulates with wheelchair.   Recent blood pressures:  10/17- 143/74  10/10- 121/65  10/03- 114/69  Recent weights:  10/03- 141.6 lbs  09/01- 140.3 lbs  08/24- 140.0 lbs      Past Medical History:  Diagnosis Date   Hearing loss    History of fracture of clavicle    Hypothyroidism    Insomnia    Tinnitus    Past Surgical History:  Procedure Laterality Date   CHOLECYSTECTOMY  2007   Dr. Verdene Lennert   CLAVICLE EXCISION  1986   INTRAMEDULLARY (IM) NAIL INTERTROCHANTERIC Left 04/09/2020   Procedure: LEFT INTERTROCHANTERIC INTRAMEDULLARY (IM) NAIL;  Surgeon: Leandrew Koyanagi, MD;  Location: [place];  Service: Orthopedics;  Laterality: Left;   MOHS SURGERY     past 10 years chest & legs   OPEN REDUCTION INTERNAL FIXATION (ORIF) DISTAL RADIAL FRACTURE Left 04/09/2020   Procedure: CLOSED REDUCTION LEFT DISTAL RADIUS FRACTURE;  Surgeon: Leandrew Koyanagi, MD;  Location: [place];  Service: Orthopedics;  Laterality: Left;    Allergies  Allergen Reactions   Metformin And Related Diarrhea    Outpatient Encounter Medications as of 12/25/2021  Medication Sig   acetaminophen (TYLENOL) 325 MG tablet Take 650 mg by mouth 3 (three) times daily as needed.   atorvastatin (LIPITOR) 10 MG tablet Take 10 mg by mouth daily.   Cholecalciferol (VITAMIN D3) 2000 units TABS Take 1 tablet by mouth daily.  DIMETHICONE, TOPICAL, (CERAVE BABY) 1 % LOTN Apply 1 application topically daily.   divalproex (DEPAKOTE) 125 MG DR tablet Take 125 mg by mouth daily.   docusate sodium (COLACE) 100 MG capsule Take 100 mg by mouth daily.   Emollient (CERAVE) LOTN Apply topically every other day. Apply to shoulders, back, and chest   hydrocortisone cream 1 % Apply 1 application. topically 2 (two) times daily. Apply to  back, abdomen, and chest   ipratropium-albuterol (DUONEB) 0.5-2.5 (3) MG/3ML SOLN Take 3 mLs by nebulization every 6 (six) hours as needed.   levothyroxine (SYNTHROID) 75 MCG tablet TAKE 1 TABLET (75 MCG TOTAL) BY MOUTH DAILY BEFORE BREAKFAST.   LORazepam (ATIVAN) 0.5 MG tablet Take 1 tablet (0.5 mg total) by mouth 2 (two) times daily as needed.   losartan (COZAAR) 25 MG tablet Take 12.5 mg by mouth daily. Hold if SBP<110   metoprolol tartrate (LOPRESSOR) 25 MG tablet Take 12.5 mg by mouth 2 (two) times daily.   ondansetron (ZOFRAN) 4 MG tablet Take 1 tablet (4 mg total) by mouth 2 (two) times daily as needed for nausea or vomiting.   polyethylene glycol (MIRALAX / GLYCOLAX) 17 g packet Take 17 g by mouth daily as needed for moderate constipation.   QUEtiapine (SEROQUEL) 25 MG tablet Take 37.5 mg by mouth daily at 2 PM. Hold 09/13- 09/18 while on paxlovid   sertraline (ZOLOFT) 50 MG tablet Take 50 mg by mouth daily.   zinc oxide 20 % ointment Apply 1 application topically as needed for irritation.   No facility-administered encounter medications on file as of 12/25/2021.    Review of Systems  Unable to perform ROS: Dementia    Immunization History  Administered Date(s) Administered   DTaP 07/20/2013   Influenza, High Dose Seasonal PF 12/07/2016   Influenza,inj,Quad PF,6+ Mos 12/01/2017   Influenza-Unspecified 11/20/2014, 12/12/2015, 11/20/2019   Moderna SARS-COV2 Booster Vaccination 08/06/2020   Moderna Sars-Covid-2 Vaccination 03/05/2019, 04/02/2019   PFIZER(Purple Top)SARS-COV-2 Vaccination 04/01/2020, 08/06/2020, 11/19/2020   PPD Test 08/06/2014   Pneumococcal Conjugate-13 11/07/2013   Pneumococcal Polysaccharide-23 03/22/2017   Tetanus 03/02/2015   Zoster Recombinat (Shingrix) 06/07/2017, 08/26/2017   Zoster, Live 03/01/2006   Pertinent  Health Maintenance Due  Topic Date Due   FOOT EXAM  07/23/2021   INFLUENZA VACCINE  09/29/2021   HEMOGLOBIN A1C  02/20/2022    OPHTHALMOLOGY EXAM  11/19/2022   DEXA SCAN  Completed      04/13/2020    8:56 AM 04/13/2020    8:00 PM 04/14/2020    7:47 AM 09/12/2020   12:07 PM 09/28/2021   10:30 AM  Fall Risk  Falls in the past year?    1 1  Was there an injury with Fall?    1 0  Fall Risk Category Calculator    3 2  Fall Risk Category    High Moderate  Patient Fall Risk Level High fall risk High fall risk High fall risk  Moderate fall risk  Patient at Risk for Falls Due to    History of fall(s);Impaired balance/gait;Impaired mobility;Mental status change History of fall(s);Impaired balance/gait;Impaired mobility  Fall risk Follow up    Falls evaluation completed;Education provided;Falls prevention discussed Falls evaluation completed;Education provided;Falls prevention discussed   Functional Status Survey:    Vitals:   12/25/21 1051  BP: 120/68  Pulse: 64  Resp: 18  Temp: 98.7 F (37.1 C)  SpO2: 98%  Weight: 141 lb 9.6 oz (64.2 kg)  Height: 5' (1.524 m)  Body mass index is 27.65 kg/m. Physical Exam Vitals reviewed.  Constitutional:      General: She is not in acute distress. HENT:     Head: Normocephalic.     Right Ear: There is impacted cerumen.     Left Ear: There is no impacted cerumen.     Nose: Nose normal.     Mouth/Throat:     Mouth: Mucous membranes are moist.  Eyes:     General:        Right eye: No discharge.        Left eye: No discharge.  Cardiovascular:     Rate and Rhythm: Normal rate and regular rhythm.     Pulses: Normal pulses.     Heart sounds: Murmur heard.  Pulmonary:     Effort: Pulmonary effort is normal. No respiratory distress.     Breath sounds: Normal breath sounds. No wheezing.  Abdominal:     General: Bowel sounds are normal. There is no distension.     Palpations: Abdomen is soft.     Tenderness: There is no abdominal tenderness.  Musculoskeletal:     Cervical back: Neck supple.     Right lower leg: No edema.     Left lower leg: No edema.  Skin:     General: Skin is warm and dry.     Capillary Refill: Capillary refill takes less than 2 seconds.     Comments: Senile purpura to upper arms  Neurological:     General: No focal deficit present.     Mental Status: She is alert and oriented to person, place, and time.     Motor: Weakness present.     Gait: Gait abnormal.     Comments: wheelchair  Psychiatric:        Mood and Affect: Mood normal.        Behavior: Behavior normal.     Labs reviewed: Recent Labs    05/11/21 0000  NA 142  K 4.0  CL 107  CO2 27*  BUN 20  CREATININE 0.5  CALCIUM 8.8   Recent Labs    05/11/21 0000  AST 10*  ALT 8  ALKPHOS 76  ALBUMIN 3.2*   Recent Labs    05/11/21 0000  WBC 8.8  NEUTROABS 5,262.00  HGB 10.6*  HCT 34*  PLT 301   Lab Results  Component Value Date   TSH 0.69 11/21/2020   Lab Results  Component Value Date   HGBA1C 5.6 08/21/2021   Lab Results  Component Value Date   CHOL 96 11/21/2020   HDL 33 (A) 11/21/2020   LDLCALC 45 11/21/2020   TRIG 94 11/21/2020   CHOLHDL 3.0 12/13/2019    Significant Diagnostic Results in last 30 days:  No results found.  Assessment/Plan 1. Impacted cerumen of right ear - start debrox 5gtts- apply to right ear BID x 5 days - flush ears with warm water when debrox complete  2. Prediabetes - A1c stable - diet controlled  3. Vascular dementia with behavior disturbance ([place]) - behaviors in afternoon - ambulates with wheelchair - cont skilled nursing - cont Seroquel, Depakote, and ativan prn  4. Primary hypertension - controlled - cont losartan  5. Mixed hyperlipidemia - HLD stable - cont Lipitor  6. Acquired hypothyroidism - TSH stable - cont levothyroxine  7. Depression with anxiety - mood stable - cont zoloft    Family/ staff Communication: plan discussed with patient, duaghter and nurse  Labs/tests ordered:  none

## 2022-01-05 ENCOUNTER — Other Ambulatory Visit: Payer: Self-pay | Admitting: Adult Health

## 2022-01-05 DIAGNOSIS — Z23 Encounter for immunization: Secondary | ICD-10-CM | POA: Diagnosis not present

## 2022-01-05 DIAGNOSIS — F419 Anxiety disorder, unspecified: Secondary | ICD-10-CM

## 2022-01-05 MED ORDER — ALPRAZOLAM 0.5 MG PO TABS: 0.5000 mg | ORAL_TABLET | Freq: Two times a day (BID) | ORAL | 0 refills | Status: AC | PRN

## 2022-02-17 ENCOUNTER — Encounter: Payer: Self-pay | Admitting: Orthopedic Surgery

## 2022-02-17 ENCOUNTER — Non-Acute Institutional Stay (SKILLED_NURSING_FACILITY): Payer: Medicare Other | Admitting: Orthopedic Surgery

## 2022-02-17 DIAGNOSIS — E119 Type 2 diabetes mellitus without complications: Secondary | ICD-10-CM | POA: Diagnosis not present

## 2022-02-17 DIAGNOSIS — R635 Abnormal weight gain: Secondary | ICD-10-CM | POA: Diagnosis not present

## 2022-02-17 DIAGNOSIS — F418 Other specified anxiety disorders: Secondary | ICD-10-CM | POA: Diagnosis not present

## 2022-02-17 DIAGNOSIS — I10 Essential (primary) hypertension: Secondary | ICD-10-CM

## 2022-02-17 DIAGNOSIS — E782 Mixed hyperlipidemia: Secondary | ICD-10-CM

## 2022-02-17 DIAGNOSIS — E039 Hypothyroidism, unspecified: Secondary | ICD-10-CM

## 2022-02-17 DIAGNOSIS — F01518 Vascular dementia, unspecified severity, with other behavioral disturbance: Secondary | ICD-10-CM | POA: Diagnosis not present

## 2022-02-17 NOTE — Progress Notes (Signed)
Location:  [facility] Room Number: 39/A Place of Service:  SNF [phone]) Provider:  Yvonna Alanis, NP   [name], MD  Patient Care Team: [name], MD as PCP - General (Internal Medicine)  Extended Emergency Contact Information Primary Emergency Contact: Harris,Margaret Address: [address]          [place], [place] [address] [name] of [place] Phone: [phone] Mobile Phone: [phone] Relation: Daughter  Code Status:  DNR Goals of care: Advanced Directive information    10/15/2021   10:38 AM  Advanced Directives  Does Patient Have a Medical Advance Directive? Yes  Type of Paramedic of [place];Living will;Out of facility DNR (pink MOST or yellow form)  Does patient want to make changes to medical advance directive? No - Patient declined  Copy of [place] in Chart? Yes - validated most recent copy scanned in chart (See row information)  Pre-existing out of facility DNR order (yellow form or pink MOST form) Yellow form placed in chart (order not valid for inpatient use)     Chief Complaint  Patient presents with   Medical Management of Chronic Issues    HPI:  Pt is a 88 y.o. female seen today for medical management of chronic diseases.    She currently resides on the skilled nursing unit at [facility]. Past medical includes: HTN, constipation, T2DM, hypothyroidism, OA, urinary retention, vascular dementia, depression and anxiety.    T2DM- a1c 5.9 11/05/2021, blood sugars averaging 120-150's, weights has trended up since April 2023, no hypoglycemic events, remains on carb mod diet Dementia- followed by palliative, BIMS 7/15 (11/24)> was  6/15 (08/28), CT head 04/2020 age related atrophy and chronic small vessel ischemia, some agitation in late afternoon, poor safety awareness, remains on Depakote, Seroquel and Ativan prn HTN- BUN/creat 23/0.70 11/05/2021, remains on metoprolol and  losartan HLD- LDL 45 11/20/2020, remains on Lipitor Hypothyroidism- TSH 2.88 11/05/2021, remains on levothyroxine  Depression/anxiety- no mood changes or panic attacks, good family support, remains on Zoloft, Na+ 138 11/05/2021 Weight gain- weights up 20 lbs since 05/2021, see trends below  No recent falls or injuries. Ambulates with wheelchair.   Recent weights:  12/02- 145.4 lbs  11/08- 142.7 lbs  10/03- 141.6 lbs  04/04- 121.9 lbs  Recent blood pressures:  12/19- 156/85  12/12- 149/79  12/08- 132/74   Past Medical History:  Diagnosis Date   Hearing loss    History of fracture of clavicle    Hypothyroidism    Insomnia    Tinnitus    Past Surgical History:  Procedure Laterality Date   CHOLECYSTECTOMY  2007   Dr. Verdene Lennert   CLAVICLE EXCISION  1986   INTRAMEDULLARY (IM) NAIL INTERTROCHANTERIC Left 04/09/2020   Procedure: LEFT INTERTROCHANTERIC INTRAMEDULLARY (IM) NAIL;  Surgeon: Leandrew Koyanagi, MD;  Location: [facility];  Service: Orthopedics;  Laterality: Left;   MOHS SURGERY     past 10 years chest & legs   OPEN REDUCTION INTERNAL FIXATION (ORIF) DISTAL RADIAL FRACTURE Left 04/09/2020   Procedure: CLOSED REDUCTION LEFT DISTAL RADIUS FRACTURE;  Surgeon: Leandrew Koyanagi, MD;  Location: [facility];  Service: Orthopedics;  Laterality: Left;    Allergies  Allergen Reactions   Metformin And Related Diarrhea    Outpatient Encounter Medications as of 02/17/2022  Medication Sig   acetaminophen (TYLENOL) 325 MG tablet Take 650 mg by mouth 3 (three) times daily as needed.   atorvastatin (LIPITOR) 10 MG  tablet Take 10 mg by mouth daily.   Cholecalciferol (VITAMIN D3) 2000 units TABS Take 1 tablet by mouth daily.    DIMETHICONE, TOPICAL, (CERAVE BABY) 1 % LOTN Apply 1 application topically daily.   divalproex (DEPAKOTE) 125 MG DR tablet Take 125 mg by mouth daily.   docusate sodium (COLACE) 100 MG capsule Take 100 mg by mouth daily.   Emollient (CERAVE) LOTN Apply topically every other day.  Apply to shoulders, back, and chest   hydrocortisone cream 1 % Apply 1 application. topically 2 (two) times daily. Apply to back, abdomen, and chest   ipratropium-albuterol (DUONEB) 0.5-2.5 (3) MG/3ML SOLN Take 3 mLs by nebulization every 6 (six) hours as needed.   levothyroxine (SYNTHROID) 75 MCG tablet TAKE 1 TABLET (75 MCG TOTAL) BY MOUTH DAILY BEFORE BREAKFAST.   LORazepam (ATIVAN) 0.5 MG tablet Take 1 tablet (0.5 mg total) by mouth 2 (two) times daily as needed.   losartan (COZAAR) 25 MG tablet Take 12.5 mg by mouth daily. Hold if SBP<110   metoprolol tartrate (LOPRESSOR) 25 MG tablet Take 12.5 mg by mouth 2 (two) times daily.   ondansetron (ZOFRAN) 4 MG tablet Take 1 tablet (4 mg total) by mouth 2 (two) times daily as needed for nausea or vomiting.   polyethylene glycol (MIRALAX / GLYCOLAX) 17 g packet Take 17 g by mouth daily as needed for moderate constipation.   QUEtiapine (SEROQUEL) 25 MG tablet Take 37.5 mg by mouth daily at 2 PM. Hold 09/13- 09/18 while on paxlovid   sertraline (ZOLOFT) 50 MG tablet Take 50 mg by mouth daily.   zinc oxide 20 % ointment Apply 1 application topically as needed for irritation.   No facility-administered encounter medications on file as of 02/17/2022.    Review of Systems  Unable to perform ROS: Dementia    Immunization History  Administered Date(s) Administered   DTaP 07/20/2013   Fluad Quad(high Dose 65+) 12/23/2021   Influenza, High Dose Seasonal PF 12/07/2016   Influenza,inj,Quad PF,6+ Mos 12/01/2017   Influenza-Unspecified 11/20/2014, 12/12/2015, 11/20/2019   Moderna SARS-COV2 Booster Vaccination 08/06/2020   Moderna Sars-Covid-2 Vaccination 03/05/2019, 04/02/2019   PFIZER(Purple Top)SARS-COV-2 Vaccination 04/01/2020, 08/06/2020, 11/19/2020   PPD Test 08/06/2014   Pneumococcal Conjugate-13 11/07/2013   Pneumococcal Polysaccharide-23 03/22/2017   Tetanus 03/02/2015   Zoster Recombinat (Shingrix) 06/07/2017, 08/26/2017   Zoster, Live  03/01/2006   Pertinent  Health Maintenance Due  Topic Date Due   FOOT EXAM  07/23/2021   HEMOGLOBIN A1C  02/20/2022   OPHTHALMOLOGY EXAM  11/19/2022   INFLUENZA VACCINE  Completed   DEXA SCAN  Completed      04/13/2020    8:56 AM 04/13/2020    8:00 PM 04/14/2020    7:47 AM 09/12/2020   12:07 PM 09/28/2021   10:30 AM  Fall Risk  Falls in the past year?    1 1  Was there an injury with Fall?    1 0  Fall Risk Category Calculator    3 2  Fall Risk Category    High Moderate  Patient Fall Risk Level High fall risk High fall risk High fall risk  Moderate fall risk  Patient at Risk for Falls Due to    History of fall(s);Impaired balance/gait;Impaired mobility;Mental status change History of fall(s);Impaired balance/gait;Impaired mobility  Fall risk Follow up    Falls evaluation completed;Education provided;Falls prevention discussed Falls evaluation completed;Education provided;Falls prevention discussed   Functional Status Survey:    Vitals:   02/17/22 1454 02/17/22 1503  BP: (!) 156/85 132/78  Pulse: 88   Resp: 19   Temp: (!) 97.5 F (36.4 C)   SpO2: 97%   Weight: 145 lb 6.4 oz (66 kg)   Height: 5' (1.524 m)    Body mass index is 28.4 kg/m. Physical Exam Vitals reviewed.  Constitutional:      General: She is not in acute distress. HENT:     Head: Normocephalic.     Right Ear: There is no impacted cerumen.     Left Ear: There is no impacted cerumen.     Nose: Nose normal.     Mouth/Throat:     Mouth: Mucous membranes are moist.  Eyes:     General:        Right eye: No discharge.        Left eye: No discharge.  Cardiovascular:     Rate and Rhythm: Normal rate and regular rhythm.     Pulses:          Dorsalis pedis pulses are 1+ on the right side and 1+ on the left side.       Posterior tibial pulses are 1+ on the right side and 1+ on the left side.     Heart sounds: Normal heart sounds.  Pulmonary:     Effort: Pulmonary effort is normal. No respiratory distress.      Breath sounds: Normal breath sounds. No wheezing.  Abdominal:     General: Bowel sounds are normal. There is no distension.     Palpations: Abdomen is soft.     Tenderness: There is no abdominal tenderness.  Musculoskeletal:     Cervical back: Neck supple.     Right lower leg: No edema.     Left lower leg: No edema.  Feet:     Right foot:     Protective Sensation: 8 sites tested.  10 sites sensed.     Skin integrity: Dry skin present.     Toenail Condition: Right toenails are normal.     Left foot:     Protective Sensation: 8 sites tested.  10 sites sensed.     Skin integrity: Dry skin present.     Toenail Condition: Left toenails are normal.  Skin:    General: Skin is warm and dry.     Capillary Refill: Capillary refill takes less than 2 seconds.  Neurological:     General: No focal deficit present.     Mental Status: She is alert. Mental status is at baseline.     Motor: Weakness present.     Gait: Gait abnormal.     Comments: wheelchair  Psychiatric:        Mood and Affect: Mood normal.        Behavior: Behavior normal.     Comments: Very pleasant, follows commands, alert to self/familiar face     Labs reviewed: Recent Labs    05/11/21 0000  NA 142  K 4.0  CL 107  CO2 27*  BUN 20  CREATININE 0.5  CALCIUM 8.8   Recent Labs    05/11/21 0000  AST 10*  ALT 8  ALKPHOS 76  ALBUMIN 3.2*   Recent Labs    05/11/21 0000  WBC 8.8  NEUTROABS 5,262.00  HGB 10.6*  HCT 34*  PLT 301   Lab Results  Component Value Date   TSH 0.69 11/21/2020   Lab Results  Component Value Date   HGBA1C 5.6 08/21/2021   Lab Results  Component Value  Date   CHOL 96 11/21/2020   HDL 33 (A) 11/21/2020   LDLCALC 45 11/21/2020   TRIG 94 11/21/2020   CHOLHDL 3.0 12/13/2019    Significant Diagnostic Results in last 30 days:  No results found.  Assessment/Plan 1. Diabetes mellitus type 2 in nonobese (HCC) - A1c 5.9 10/2021, blood sugars 110-150's, no hypoglycemia -  concerns for increased weight - recheck A1c  2. Vascular dementia with behavior disturbance (HCC) - no behaviors - BIMS score in same range - cont Depakote, Seroquel and ativan  3. Primary hypertension - controlled - cont losartan and metoprolol  4. Mixed hyperlipidemia - cont tatin  5. Acquired hypothyroidism - TSH stable - cont levothyroxine  6. Depression with anxiety - no mood changes - cont zoloft  7. Weight gain - up 20 lbs since 05/2021 - BMI 28.40 - TSH normal - she is on Depakote - cont monthly weights    Family/ staff Communication: plan discussed with patient, daughter and nurse  Labs/tests ordered:  A1c 02/18/2022

## 2022-02-18 DIAGNOSIS — E119 Type 2 diabetes mellitus without complications: Secondary | ICD-10-CM | POA: Diagnosis not present

## 2022-02-19 LAB — HEMOGLOBIN A1C: Hemoglobin A1C: 6.5

## 2022-02-24 ENCOUNTER — Encounter: Payer: Self-pay | Admitting: Family Medicine

## 2022-02-24 ENCOUNTER — Non-Acute Institutional Stay: Payer: Medicare Other | Admitting: Family Medicine

## 2022-02-24 VITALS — BP 122/70 | HR 88 | Temp 97.8°F | Resp 18

## 2022-02-24 DIAGNOSIS — N838 Other noninflammatory disorders of ovary, fallopian tube and broad ligament: Secondary | ICD-10-CM

## 2022-02-24 DIAGNOSIS — F03918 Unspecified dementia, unspecified severity, with other behavioral disturbance: Secondary | ICD-10-CM

## 2022-02-24 NOTE — Progress Notes (Signed)
[facility] Pall[facility] Consult Note Telephone: [phone]  Fax: [phone]    Date of encounter: 02/24/22 11:27 AM PATIENT NAME: Lisa Castillo [place] [address]   [phone] (home)  DOB: 05-13-1928 MRN: [id number] PRIMARY CARE PROVIDER:    [name], MD,  [place] [address] [id number]  REFERRING PROVIDER:   [name], MD [address] [place],  [place] [address] [phone]  RESPONSIBLE PARTY:    Contact Information     Name Relation Home Work [place] Daughter [phone]  [phone]        I met face to face with patient in [place]. Palliative Care was asked to follow this patient by consultation request of  [name], MD to address advance care planning and complex medical decision making. This is a follow up visit   ASSESSMENT , SYMPTOM MANAGEMENT AND PLAN / RECOMMENDATIONS:    Left tubo-ovarian mass No follow up noted, question if because of pt's advanced age. Appears comfortable currently. No noted ascites or abdominal distension Discuss goals of care with family and see if MOST completion is desired.  2.  Dementia FAST 7 score 6 e Has advance directives and HC POA Has had weight gain, multiple falls    Advance Care Planning/Goals of Care: Goals include to maximize quality of life and symptom management.  Identification of a healthcare agent-Lisa Castillo per [facility] POA, cell [phone].   Review of an advance directive document/HC POA.  Advance Directive grants authority to approve or withhold medications, treatments, diagnostics.  Grants authority to withhold artificial nutrition/hydration and withhold or withdraw life prolonging measures Decision not to resuscitate or to de-escalate disease focused treatments due to poor prognosis. CODE STATUS:   Has DNR and advance directive, no MOST  form    Follow up Palliative Care Visit: Palliative care will continue to follow for complex medical decision making, advance care planning, and clarification of goals. Return 6-8 weeks or prn.   This visit was coded based on medical decision making (MDM).  PPS: 50%  HOSPICE ELIGIBILITY/DIAGNOSIS: TBD  Chief Complaint:  Palliative Care is continuing to follow patient for chronic medical management in setting of dementia and to assist with refining and defining goals of care.   HISTORY OF PRESENT ILLNESS:  Lisa Castillo is a 88 y.o. year old female with dementia, depression with anxiety, HTN, left tubo-ovarian mass, frequent falls, hx of rectal bleed with anemia, HLD, gait abnormality, OA and osteopenia, DM and hypothyroidism. Had left tubo-ovarian mass on CT and [place] 11/2019 with recommendation for either further imaging with MRI, repeat US in 3 months and/or follow up with Gynecology which was not done per records review.  Facility staff states pt eats well, is dependent for bathing and dressing, is WC bound due to number of falls and is intermittently continent of bowel/bladder.  Pt denies pain, SOB, nausea, vomiting, dysuria or constipation.  Boost Glucose Control at HS.  Has blood sugars daily at 7:30 am.  History obtained from review of EMR, discussion with facility staff and/or Lisa Castillo.   08/21/21 HGB A1c 5.6%     Latest Ref Rng & Units 05/11/2021   12:00 AM 11/21/2020    1:10 AM 11/21/2020   12:00 AM  CBC  WBC  8.8     7.1     6.8      Hemoglobin 12.0 - 16.0  10.6     10.7     10.6      Hematocrit 36 - 46 34     34     34      Platelets 150 - 400 K/uL 301     220     218         This result is from an external source.      Latest Ref Rng & Units 05/11/2021   12:00 AM 11/21/2020    1:10 AM 11/21/2020   12:00 AM  CMP  BUN 4 - _0 Creatinine 0.5 - 1.1 0.5     0.7     0.7      Sodium 137 - 147 142     141     139      Potassium 3.5 - 5.1 mEq/L 4.0     3.7      3.9      Chloride 99 - 108 107     105     105      CO2 13 - _1 Calcium 8.7 - 10.7 8.8     8.4     8.6      Alkaline Phos 25 - 125 76     65     63      AST 13 - 35 _2 ALT 7 - 35 U/L _3 This result is from an external source.    12/28/19 US pelvis: FINDINGS: Uterus   Measurements: 6 x 2.9 x 3.9 cm = volume: 35 mL. Retroverted uterus. Atrophic appearance is quite typical for a patient of advanced age.   Endometrium   Thickness: 7.3 mm, top normal for patient age albeit with a small amount of endometrial fluid.   Right ovary   Not visualized on these transabdominal images.   Left ovary   Cystic lesion in the left adnexa measuring 5.8 x 5.4 x 5.2 cm with estimated volume of 81 mL. Normal ovarian tissue is not well visualized. No significant internal complexity.   Other findings:  No abnormal free fluid.   IMPRESSION: Redemonstration of a cystic lesion arising in the left adnexa without visualization of the normal ovarian parenchyma. Measures 5.8 cm on this examination. Consider GYN consult and followup US in 3-6 months, or pelvis MRI w/o and w/ contrast for improved characterization.   Note: This recommendation does not apply to premenarchal patients or to those with increased risk (genetic, family history, elevated tumor markers or other high-risk factors) of ovarian cancer. Reference: Radiology 2019 Nov; 293(2):359-371.   Top-normal endometrial thickness albeit with a small amount of fluid within the endometrial canal warranting further evaluation. Endometrial sampling should be considered to exclude carcinoma.   Nonvisualization of the right ovary.   Retroverted, atrophic uterus.  I reviewed EMR for available labs, medications, imaging, studies and related documents.  Records reviewed and summarized above.   ROS General: NAD Cardiovascular: denies chest pain, denies DOE Pulmonary: denies cough,  denies SOB Abdomen: endorses good appetite, denies constipation, staff reports incontinence of bowel GU: denies dysuria, staff reports incontinence of urine MSK:  denies increased  weakness, no falls reported Skin: denies rashes or wounds Neurological: denies pain, denies insomnia Psych: Endorses positive mood Heme/lymph/immuno: denies bruises, abnormal bleeding  Physical Exam: Current and past weights: 02/17/22 weight 145 lbs 6.4 oz, on 07/30/21 weight was 127 lbs Constitutional: NAD General: WNWD  ENMT: hard of hearing CV: S1S2, RRR, no LE edema Pulmonary: CTAB, no increased work of breathing, no cough, room air Abdomen: hypo-active BS + 4 quadrants, soft and non tender, no ascites GU: deferred MSK: no sarcopenia, moves all extremities, ambulatory Skin: warm and dry, no rashes or wounds on visible skin Neuro:  no generalized weakness,  noted mild cognitive impairment Psych: non-anxious affect, A and O x 2 Hem/lymph/immuno: no widespread bruising   Thank you for the opportunity to participate in the care of Lisa Castillo.  The palliative care team will continue to follow. Please call our office at [phone] if we can be of additional assistance.   Marijo Conception, FNP -C  COVID-19 PATIENT SCREENING TOOL Asked and negative response unless otherwise noted:   Have you had symptoms of covid, tested positive or been in contact with someone with symptoms/positive test in the past 5-10 days?  Unknown

## 2022-03-18 ENCOUNTER — Non-Acute Institutional Stay (SKILLED_NURSING_FACILITY): Payer: Medicare Other | Admitting: Internal Medicine

## 2022-03-18 ENCOUNTER — Encounter: Payer: Self-pay | Admitting: Internal Medicine

## 2022-03-18 DIAGNOSIS — F418 Other specified anxiety disorders: Secondary | ICD-10-CM | POA: Diagnosis not present

## 2022-03-18 DIAGNOSIS — E119 Type 2 diabetes mellitus without complications: Secondary | ICD-10-CM | POA: Diagnosis not present

## 2022-03-18 DIAGNOSIS — I10 Essential (primary) hypertension: Secondary | ICD-10-CM | POA: Diagnosis not present

## 2022-03-18 DIAGNOSIS — F01518 Vascular dementia, unspecified severity, with other behavioral disturbance: Secondary | ICD-10-CM

## 2022-03-18 DIAGNOSIS — E782 Mixed hyperlipidemia: Secondary | ICD-10-CM

## 2022-03-18 DIAGNOSIS — E039 Hypothyroidism, unspecified: Secondary | ICD-10-CM | POA: Diagnosis not present

## 2022-03-18 NOTE — Progress Notes (Signed)
abstraction

## 2022-03-18 NOTE — Progress Notes (Signed)
Location:  [facility] Room Number: [place] of Service:  SNF [phone]) Provider:  [name], MD   [name], MD  Patient Care Team: [name], MD as PCP - General (Internal Medicine)  Extended Emergency Contact Information Primary Emergency Contact: Harris,Margaret Address: [address]          [place], [place] [address] [name] of [place] Phone: [phone] Mobile Phone: [phone] Relation: Daughter  Code Status:  DNR Palliative Care Goals of care: Advanced Directive information    03/18/2022    3:48 PM  Advanced Directives  Does Patient Have a Medical Advance Directive? Yes  Type of Paramedic of [place];Out of facility DNR (pink MOST or yellow form);Living will  Does patient want to make changes to medical advance directive? No - Patient declined  Copy of [place] in Chart? Yes - validated most recent copy scanned in chart (See row information)     Chief Complaint  Patient presents with   Medical Management of Chronic Issues    Routine Visit   Quality Metric Gaps    [name] due for a A1C    HPI:  Pt is a 89 y.o. female seen today for medical management of chronic diseases.    [name] Resident of SNF    Patient has a history of type 2 diabetes, hyperlipidemia, depression, dementia, hypothyroidism, osteopenia, stress and urge urinary incontinence, Tachycardia  h/o Hip fracture and Compression fracture after falls   She continues to be stable some behavior issues but manageable Has some aphasia but mostly pleasantly confused Stays in her wheelchair Had a fall in 12/08 with no injuries No Other acute issues Has gained weight recently Wt Readings from Last 3 Encounters:  03/18/22 149 lb 1.6 oz (67.6 kg)  02/17/22 145 lb 6.4 oz (66 kg)  12/25/21 141 lb 9.6 oz (64.2 kg)    Past Medical History:  Diagnosis Date   Burst fracture of lumbar vertebra, sequela 12/27/2019    Closed left hip fracture ([place]) 04/07/2020   Fall as cause of accidental injury in residential institution as place of occurrence 04/07/2020   Hearing loss    History of fracture of clavicle    Hypokalemia 01/21/2020   01/21/20 Na 141, K 4.1, Bun 12, creat 0.65, eGFR 79, wbc 9.2, Hgb 12.7, plt 331   Hypothyroidism    Insomnia    Right lower lobe pneumonia 01/14/2020   Tachycardia 04/03/2019   Tinnitus    Unspecified fracture of the lower end of left radius, initial encounter for closed fracture 04/07/2020   Past Surgical History:  Procedure Laterality Date   CHOLECYSTECTOMY  2007   Dr. Verdene Lennert   CLAVICLE EXCISION  1986   INTRAMEDULLARY (IM) NAIL INTERTROCHANTERIC Left 04/09/2020   Procedure: LEFT INTERTROCHANTERIC INTRAMEDULLARY (IM) NAIL;  Surgeon: Leandrew Koyanagi, MD;  Location: [facility];  Service: Orthopedics;  Laterality: Left;   MOHS SURGERY     past 10 years chest & legs   OPEN REDUCTION INTERNAL FIXATION (ORIF) DISTAL RADIAL FRACTURE Left 04/09/2020   Procedure: CLOSED REDUCTION LEFT DISTAL RADIUS FRACTURE;  Surgeon: Leandrew Koyanagi, MD;  Location: [facility];  Service: Orthopedics;  Laterality: Left;    Allergies  Allergen Reactions   Metformin And Related Diarrhea    Outpatient Encounter Medications as of 03/18/2022  Medication Sig   acetaminophen (TYLENOL) 325 MG tablet Take 650 mg by mouth 3 (three) times daily as needed.  ALPRAZolam (XANAX) 0.5 MG tablet Take 0.5 mg by mouth 2 (two) times daily as needed for anxiety.   Cholecalciferol (D3) 50 MCG (2000 UT) TABS Take by mouth.   DIMETHICONE, TOPICAL, (CERAVE BABY) 1 % LOTN Apply 1 application topically daily.   divalproex (DEPAKOTE) 125 MG DR tablet Take 125 mg by mouth daily.   docusate sodium (COLACE) 100 MG capsule Take 100 mg by mouth daily.   Emollient (CERAVE) LOTN Apply topically every other day. Apply to shoulders, back, and chest   hydrocortisone cream 1 % Apply 1 application. topically 2 (two) times daily. Apply to back,  abdomen, and chest   ipratropium-albuterol (DUONEB) 0.5-2.5 (3) MG/3ML SOLN Take 3 mLs by nebulization every 6 (six) hours as needed.   levothyroxine (SYNTHROID) 75 MCG tablet TAKE 1 TABLET (75 MCG TOTAL) BY MOUTH DAILY BEFORE BREAKFAST.   metoprolol tartrate (LOPRESSOR) 25 MG tablet Take 12.5 mg by mouth 2 (two) times daily.   ondansetron (ZOFRAN) 4 MG tablet Take 1 tablet (4 mg total) by mouth 2 (two) times daily as needed for nausea or vomiting.   polyethylene glycol (MIRALAX / GLYCOLAX) 17 g packet Take 17 g by mouth daily as needed for moderate constipation.   QUEtiapine (SEROQUEL) 25 MG tablet Take 37.5 mg by mouth daily at 2 PM. Hold 09/13- 09/18 while on paxlovid   sertraline (ZOLOFT) 50 MG tablet Take 50 mg by mouth daily.   zinc oxide 20 % ointment Apply 1 application topically as needed for irritation.   [DISCONTINUED] losartan (COZAAR) 25 MG tablet Take 12.5 mg by mouth daily. Hold if SBP<110 (Patient not taking: Reported on 02/24/2022)   No facility-administered encounter medications on file as of 03/18/2022.    Review of Systems  Unable to perform ROS: Dementia    Immunization History  Administered Date(s) Administered   DTaP 07/20/2013   Fluad Quad(high Dose 65+) 12/23/2021   Influenza, High Dose Seasonal PF 12/07/2016   Influenza,inj,Quad PF,6+ Mos 12/01/2017   Influenza-Unspecified 11/20/2014, 12/12/2015, 11/20/2019   Moderna SARS-COV2 Booster Vaccination 08/06/2020   Moderna Sars-Covid-2 Vaccination 03/05/2019, 04/02/2019   PFIZER(Purple Top)SARS-COV-2 Vaccination 04/01/2020, 08/06/2020, 11/19/2020, 01/05/2022   PPD Test 08/06/2014   Pneumococcal Conjugate-13 11/07/2013   Pneumococcal Polysaccharide-23 03/22/2017   Tetanus 03/02/2015   Zoster Recombinat (Shingrix) 06/07/2017, 08/26/2017   Zoster, Live 03/01/2006   Pertinent  Health Maintenance Due  Topic Date Due   HEMOGLOBIN A1C  08/21/2022   OPHTHALMOLOGY EXAM  11/19/2022   FOOT EXAM  02/18/2023   INFLUENZA  VACCINE  Completed   DEXA SCAN  Completed      04/13/2020    8:56 AM 04/13/2020    8:00 PM 04/14/2020    7:47 AM 09/12/2020   12:07 PM 09/28/2021   10:30 AM  Fall Risk  Falls in the past year?    1 1  Was there an injury with Fall?    1 0  Fall Risk Category Calculator    3 2  Fall Risk Category (Retired)    High Moderate  (RETIRED) Patient Fall Risk Level High fall risk High fall risk High fall risk  Moderate fall risk  Patient at Risk for Falls Due to    History of fall(s);Impaired balance/gait;Impaired mobility;Mental status change History of fall(s);Impaired balance/gait;Impaired mobility  Fall risk Follow up    Falls evaluation completed;Education provided;Falls prevention discussed Falls evaluation completed;Education provided;Falls prevention discussed   Functional Status Survey:    Vitals:   03/18/22 1535  BP: 109/68  Pulse:  77  Resp: 18  Temp: (!) 96.9 F (36.1 C)  TempSrc: Temporal  SpO2: 95%  Weight: 149 lb 1.6 oz (67.6 kg)  Height: 5' (1.524 m)   Body mass index is 29.12 kg/m. Physical Exam Vitals reviewed.  Constitutional:      Appearance: Normal appearance.  HENT:     Head: Normocephalic.     Nose: Nose normal.     Mouth/Throat:     Mouth: Mucous membranes are moist.     Pharynx: Oropharynx is clear.  Eyes:     Pupils: Pupils are equal, round, and reactive to light.  Cardiovascular:     Rate and Rhythm: Normal rate and regular rhythm.     Pulses: Normal pulses.     Heart sounds: Normal heart sounds. No murmur heard. Pulmonary:     Effort: Pulmonary effort is normal.     Breath sounds: Normal breath sounds.  Abdominal:     General: Abdomen is flat. Bowel sounds are normal.     Palpations: Abdomen is soft.  Musculoskeletal:        General: No swelling.     Cervical back: Neck supple.  Skin:    General: Skin is warm.  Neurological:     General: No focal deficit present.     Mental Status: She is alert.  Psychiatric:        Mood and Affect: Mood  normal.        Thought Content: Thought content normal.     Labs reviewed: Recent Labs    05/11/21 0000 11/05/21 0000  NA 142 138  K 4.0 4.2  CL 107 101  CO2 27* 31*  BUN 20 23*  CREATININE 0.5 0.7  CALCIUM 8.8 9.4   Recent Labs    05/11/21 0000 11/05/21 0000  AST 10* 14  ALT 8 13  ALKPHOS 76 96  ALBUMIN 3.2* 3.9   Recent Labs    05/11/21 0000 11/05/21 0000  WBC 8.8 8.5  NEUTROABS 5,262.00  --   HGB 10.6* 13.0  HCT 34* 40  PLT 301 257   Lab Results  Component Value Date   TSH 2.88 11/05/2021   Lab Results  Component Value Date   HGBA1C 6.5 02/19/2022   Lab Results  Component Value Date   CHOL 96 11/21/2020   HDL 33 (A) 11/21/2020   LDLCALC 45 11/21/2020   TRIG 94 11/21/2020   CHOLHDL 3.0 12/13/2019    Significant Diagnostic Results in last 30 days:  No results found.  Assessment/Plan 1. Vascular dementia with behavior disturbance ([place]) Doing well Has failed Namenda in the past Continues to  have some behaviors  Stable on [place] dose Seroquel and Depakote Labs done in 09/23 were in good limits  2. Diabetes mellitus type 2 in nonobese (HCC) AiC good range Lab Results  Component Value Date   HGBA1C 6.5 02/19/2022  No Meds CBG 120-150  3. Primary hypertension Off all meds Except Metoprolol low dose  4. Mixed hyperlipidemia Statin was discontinued due to her Cognitive decline  5. Acquired hypothyroidism TSH normal 09/23   6. Depression with anxiety Zoloft and Xanax PRn    Family/ staff Communication:   Labs/tests ordered:

## 2022-03-24 ENCOUNTER — Other Ambulatory Visit: Payer: Self-pay

## 2022-03-24 NOTE — Telephone Encounter (Signed)
Refill request received from [facility] for Alprazolam 0.5 mg tablet one twice a day as needed for anxiety.  Medication pended and sent to Dr. Lyndel Safe

## 2022-04-21 ENCOUNTER — Non-Acute Institutional Stay (SKILLED_NURSING_FACILITY): Payer: Medicare Other | Admitting: Orthopedic Surgery

## 2022-04-21 ENCOUNTER — Encounter: Payer: Self-pay | Admitting: Orthopedic Surgery

## 2022-04-21 DIAGNOSIS — R635 Abnormal weight gain: Secondary | ICD-10-CM | POA: Diagnosis not present

## 2022-04-21 DIAGNOSIS — E039 Hypothyroidism, unspecified: Secondary | ICD-10-CM

## 2022-04-21 DIAGNOSIS — I10 Essential (primary) hypertension: Secondary | ICD-10-CM

## 2022-04-21 DIAGNOSIS — E782 Mixed hyperlipidemia: Secondary | ICD-10-CM | POA: Diagnosis not present

## 2022-04-21 DIAGNOSIS — E119 Type 2 diabetes mellitus without complications: Secondary | ICD-10-CM | POA: Diagnosis not present

## 2022-04-21 DIAGNOSIS — F418 Other specified anxiety disorders: Secondary | ICD-10-CM

## 2022-04-21 DIAGNOSIS — U071 COVID-19: Secondary | ICD-10-CM

## 2022-04-21 DIAGNOSIS — F01518 Vascular dementia, unspecified severity, with other behavioral disturbance: Secondary | ICD-10-CM

## 2022-04-21 NOTE — Progress Notes (Signed)
Location:   [facility] Room Number: 39-A Place of Service:  SNF [phone]) Provider:  Windell Moulding, NP  PCP: [name], MD  Patient Care Team: [name], MD as PCP - General (Internal Medicine)  Extended Emergency Contact Information Primary Emergency Contact: Harris,Margaret Address: [address]          [place], [place] [address] [name] of [place] Phone: [phone] Mobile Phone: [phone] Relation: Daughter  Code Status:  DNR Goals of care: Advanced Directive information    04/21/2022    2:00 PM  Advanced Directives  Does Patient Have a Medical Advance Directive? Yes  Type of Paramedic of [place];Living will;Out of facility DNR (pink MOST or yellow form)  Does patient want to make changes to medical advance directive? No - Patient declined  Copy of [place] in Chart? Yes - validated most recent copy scanned in chart (See row information)     Chief Complaint  Patient presents with   Medical Management of Chronic Issues    Routine Visit.    Immunizations    Discuss the need for [employer].    HPI:  Pt is a 89 y.o. female seen today for medical management of chronic diseases.    She currently resides on the skilled nursing unit at [facility]. Past medical includes: HTN, constipation, T2DM, hypothyroidism, OA, urinary retention, vascular dementia, depression and anxiety.    Covid- 02/05 tested positive, completed Paxlovid, no lingering symtpoms T2DM- A1c 6.5 (12/22)> was  5.9 11/05/2021, blood sugars averaging 120-140's, no hypoglycemic events, remains on carb mod diet Dementia- followed by palliative, BIMS 7/15 (11/24)> was  6/15 (08/28), CT head 04/2020 age related atrophy and chronic small vessel ischemia, some agitation in late afternoon, poor safety awareness, remains on Depakote, Seroquel and Ativan prn HTN- BUN/creat 23/0.70 11/05/2021, remains on metoprolol and  losartan HLD- LDL 45 11/20/2020, remains on Lipitor Hypothyroidism- TSH 2.88 11/05/2021, remains on levothyroxine  Depression/anxiety- no mood changes or panic attacks, good family support, remains on Zoloft, Na+ 138 11/05/2021 Weight gain- weights up 20 lbs since 05/2021, TSH 2.88 11/05/2021, see trends below  Recent blood pressures:  02/20- 123/72  02/13- 142/80   Past Medical History:  Diagnosis Date   Burst fracture of lumbar vertebra, sequela 12/27/2019   Closed left hip fracture ([place]) 04/07/2020   Fall as cause of accidental injury in residential institution as place of occurrence 04/07/2020   Hearing loss    History of fracture of clavicle    Hypokalemia 01/21/2020   01/21/20 Na 141, K 4.1, Bun 12, creat 0.65, eGFR 79, wbc 9.2, Hgb 12.7, plt 331   Hypothyroidism    Insomnia    Right lower lobe pneumonia 01/14/2020   Tachycardia 04/03/2019   Tinnitus    Unspecified fracture of the lower end of left radius, initial encounter for closed fracture 04/07/2020   Past Surgical History:  Procedure Laterality Date   CHOLECYSTECTOMY  2007   Dr. Verdene Lennert   CLAVICLE EXCISION  1986   INTRAMEDULLARY (IM) NAIL INTERTROCHANTERIC Left 04/09/2020   Procedure: LEFT INTERTROCHANTERIC INTRAMEDULLARY (IM) NAIL;  Surgeon: Leandrew Koyanagi, MD;  Location: [facility];  Service: Orthopedics;  Laterality: Left;   MOHS SURGERY     past 10 years chest & legs   OPEN REDUCTION INTERNAL FIXATION (ORIF) DISTAL RADIAL FRACTURE Left 04/09/2020   Procedure: CLOSED REDUCTION LEFT DISTAL RADIUS FRACTURE;  Surgeon: Leandrew Koyanagi, MD;  Location: [facility]  OR;  Service: Orthopedics;  Laterality: Left;    Allergies  Allergen Reactions   Metformin And Related Diarrhea    Allergies as of 04/21/2022       Reactions   Metformin And Related Diarrhea        Medication List        Accurate as of April 21, 2022  2:01 PM. If you have any questions, ask your nurse or doctor.          acetaminophen 325 MG  tablet Commonly known as: TYLENOL Take 650 mg by mouth 3 (three) times daily as needed.   ALPRAZolam 0.5 MG tablet Commonly known as: XANAX Take 0.5 mg by mouth 2 (two) times daily as needed for anxiety.   CeraVe Baby 1 % Lotn Generic drug: DIMETHICONE (TOPICAL) Apply 1 application topically daily.   CeraVe Lotn Apply topically every other day. Apply to shoulders, back, and chest   D3 50 MCG (2000 UT) Tabs Generic drug: Cholecalciferol Take by mouth.   divalproex 125 MG DR tablet Commonly known as: DEPAKOTE Take 125 mg by mouth daily.   docusate sodium 100 MG capsule Commonly known as: COLACE Take 100 mg by mouth daily.   hydrocortisone cream 1 % Apply 1 application  topically as needed. Apply to back, abdomen, and chest   ipratropium-albuterol 0.5-2.5 (3) MG/3ML Soln Commonly known as: DUONEB Take 3 mLs by nebulization every 6 (six) hours as needed.   levothyroxine 75 MCG tablet Commonly known as: SYNTHROID TAKE 1 TABLET (75 MCG TOTAL) BY MOUTH DAILY BEFORE BREAKFAST.   metoprolol tartrate 25 MG tablet Commonly known as: LOPRESSOR Take 12.5 mg by mouth 2 (two) times daily.   ondansetron 4 MG tablet Commonly known as: Zofran Take 1 tablet (4 mg total) by mouth 2 (two) times daily as needed for nausea or vomiting.   polyethylene glycol 17 g packet Commonly known as: MIRALAX / GLYCOLAX Take 17 g by mouth daily as needed for moderate constipation.   QUEtiapine 25 MG tablet Commonly known as: SEROQUEL Take 37.5 mg by mouth daily at 2 PM. Hold 09/13- 09/18 while on paxlovid   sertraline 50 MG tablet Commonly known as: ZOLOFT Take 50 mg by mouth daily.   zinc oxide 20 % ointment Apply 1 application topically as needed for irritation.        Review of Systems  Unable to perform ROS: Dementia    Immunization History  Administered Date(s) Administered   DTaP 07/20/2013   Fluad Quad(high Dose 65+) 12/23/2021   Influenza, High Dose Seasonal PF 12/07/2016    Influenza,inj,Quad PF,6+ Mos 12/01/2017   Influenza-Unspecified 11/20/2014, 12/12/2015, 11/20/2019   Moderna SARS-COV2 Booster Vaccination 08/06/2020   Moderna Sars-Covid-2 Vaccination 03/05/2019, 04/02/2019   PFIZER(Purple Top)SARS-COV-2 Vaccination 04/01/2020, 08/06/2020, 11/19/2020, 01/05/2022   PPD Test 08/06/2014   Pneumococcal Conjugate-13 11/07/2013   Pneumococcal Polysaccharide-23 03/22/2017   Tetanus 03/02/2015   Zoster Recombinat (Shingrix) 06/07/2017, 08/26/2017   Zoster, Live 03/01/2006   Pertinent  Health Maintenance Due  Topic Date Due   HEMOGLOBIN A1C  08/21/2022   OPHTHALMOLOGY EXAM  11/19/2022   FOOT EXAM  02/18/2023   INFLUENZA VACCINE  Completed   DEXA SCAN  Completed      04/13/2020    8:56 AM 04/13/2020    8:00 PM 04/14/2020    7:47 AM 09/12/2020   12:07 PM 09/28/2021   10:30 AM  Fall Risk  Falls in the past year?    1 1  Was there an injury  with Fall?    1 0  Fall Risk Category Calculator    3 2  Fall Risk Category (Retired)    High Moderate  (RETIRED) Patient Fall Risk Level High fall risk High fall risk High fall risk  Moderate fall risk  Patient at Risk for Falls Due to    History of fall(s);Impaired balance/gait;Impaired mobility;Mental status change History of fall(s);Impaired balance/gait;Impaired mobility  Fall risk Follow up    Falls evaluation completed;Education provided;Falls prevention discussed Falls evaluation completed;Education provided;Falls prevention discussed   Functional Status Survey:    Vitals:   04/21/22 1335  BP: 123/72  Pulse: 90  Resp: 18  Temp: (!) 96.8 F (36 C)  SpO2: 97%  Weight: 149 lb 1.6 oz (67.6 kg)  Height: 5' (1.524 m)   Body mass index is 29.12 kg/m. Physical Exam Vitals reviewed.  Constitutional:      General: She is not in acute distress. HENT:     Head: Normocephalic.     Right Ear: There is no impacted cerumen.     Left Ear: There is no impacted cerumen.     Ears:     Comments: Hearing aids     Nose: Nose normal.     Mouth/Throat:     Mouth: Mucous membranes are moist.  Eyes:     General:        Right eye: No discharge.        Left eye: No discharge.  Cardiovascular:     Rate and Rhythm: Normal rate and regular rhythm.     Pulses: Normal pulses.     Heart sounds: Normal heart sounds.  Pulmonary:     Effort: Pulmonary effort is normal. No respiratory distress.     Breath sounds: Normal breath sounds. No wheezing.  Abdominal:     General: Bowel sounds are normal. There is no distension.     Palpations: Abdomen is soft.     Tenderness: There is no abdominal tenderness.  Musculoskeletal:     Cervical back: Neck supple.     Right lower leg: No edema.     Left lower leg: No edema.  Skin:    General: Skin is warm and dry.     Capillary Refill: Capillary refill takes less than 2 seconds.  Neurological:     General: No focal deficit present.     Mental Status: She is alert. Mental status is at baseline.     Motor: Weakness present.     Gait: Gait abnormal.     Comments: wheelchair  Psychiatric:        Mood and Affect: Mood normal.     Labs reviewed: Recent Labs    05/11/21 0000 11/05/21 0000  NA 142 138  K 4.0 4.2  CL 107 101  CO2 27* 31*  BUN 20 23*  CREATININE 0.5 0.7  CALCIUM 8.8 9.4   Recent Labs    05/11/21 0000 11/05/21 0000  AST 10* 14  ALT 8 13  ALKPHOS 76 96  ALBUMIN 3.2* 3.9   Recent Labs    05/11/21 0000 11/05/21 0000  WBC 8.8 8.5  NEUTROABS 5,262.00  --   HGB 10.6* 13.0  HCT 34* 40  PLT 301 257   Lab Results  Component Value Date   TSH 2.88 11/05/2021   Lab Results  Component Value Date   HGBA1C 6.5 02/19/2022   Lab Results  Component Value Date   CHOL 96 11/21/2020   HDL 33 (A) 11/21/2020  LDLCALC 45 11/21/2020   TRIG 94 11/21/2020   CHOLHDL 3.0 12/13/2019    Significant Diagnostic Results in last 30 days:  No results found.  Assessment/Plan 1. COVID - 02/05 tested positive - completed paxlovid - no long term  symptoms  2. Diabetes mellitus type 2 in nonobese (HCC) - A1c stable, no hypoglycemias - diet controlled  3. Vascular dementia with behavior disturbance ([place]) - sundowning in afternoon/evening - dependent with ADLs except feeding - ambulates with wheelchair - cont Depakote, Seroquel and ativan  4. Primary hypertension - controlled with losartan and metoprolol  5. Mixed hyperlipidemia - cont atorvastatin  6. Acquired hypothyroidism - TSH stable - cont levothyroxine  7. Depression with anxiety - no mood changes - cont Zoloft  8. Weight gain - weight up 20 lbs in past year - she is on Depakote - BMI 29.12 - sedentary lifestyle  - cont monthly weights   Family/ staff Communication: plan discussed with patient and nurse  Labs/tests ordered:  cbc/diff, cmp, A1c, lipid panel 05/17/2022

## 2022-04-22 DIAGNOSIS — E785 Hyperlipidemia, unspecified: Secondary | ICD-10-CM | POA: Diagnosis not present

## 2022-04-22 DIAGNOSIS — E119 Type 2 diabetes mellitus without complications: Secondary | ICD-10-CM | POA: Diagnosis not present

## 2022-04-23 LAB — COMPREHENSIVE METABOLIC PANEL
Albumin: 3.8 (ref 3.5–5.0)
Calcium: 9.2 (ref 8.7–10.7)
Globulin: 3

## 2022-04-23 LAB — HEPATIC FUNCTION PANEL
ALT: 10 U/L (ref 7–35)
AST: 14 (ref 13–35)
Alkaline Phosphatase: 95 (ref 25–125)

## 2022-04-23 LAB — CBC AND DIFFERENTIAL
HCT: 41 (ref 36–46)
Hemoglobin: 13.4 (ref 12.0–16.0)
Neutrophils Absolute: 6489
Platelets: 280 10*3/uL (ref 150–400)
WBC: 10.3

## 2022-04-23 LAB — BASIC METABOLIC PANEL
BUN: 22 — AB (ref 4–21)
CO2: 26 — AB (ref 13–22)
Chloride: 101 (ref 99–108)
Creatinine: 0.7 (ref 0.5–1.1)
Glucose: 155
Potassium: 4.4 mEq/L (ref 3.5–5.1)
Sodium: 137 (ref 137–147)

## 2022-04-23 LAB — LIPID PANEL
Cholesterol: 169 (ref 0–200)
HDL: 38 (ref 35–70)
LDL Cholesterol: 95
Triglycerides: 236 — AB (ref 40–160)

## 2022-04-23 LAB — CBC: RBC: 5.05 (ref 3.87–5.11)

## 2022-04-23 LAB — HEMOGLOBIN A1C: Hemoglobin A1C: 7

## 2022-05-17 ENCOUNTER — Non-Acute Institutional Stay: Payer: Medicare Other | Admitting: Family Medicine

## 2022-05-17 DIAGNOSIS — R7301 Impaired fasting glucose: Secondary | ICD-10-CM | POA: Diagnosis not present

## 2022-05-17 DIAGNOSIS — D649 Anemia, unspecified: Secondary | ICD-10-CM | POA: Diagnosis not present

## 2022-05-17 DIAGNOSIS — E119 Type 2 diabetes mellitus without complications: Secondary | ICD-10-CM | POA: Diagnosis not present

## 2022-05-17 DIAGNOSIS — E785 Hyperlipidemia, unspecified: Secondary | ICD-10-CM | POA: Diagnosis not present

## 2022-05-18 ENCOUNTER — Encounter: Payer: Self-pay | Admitting: Family Medicine

## 2022-05-18 ENCOUNTER — Non-Acute Institutional Stay: Payer: Medicare Other | Admitting: Family Medicine

## 2022-05-18 VITALS — BP 122/64 | HR 72 | Temp 97.2°F | Resp 18

## 2022-05-18 DIAGNOSIS — F03918 Unspecified dementia, unspecified severity, with other behavioral disturbance: Secondary | ICD-10-CM | POA: Diagnosis not present

## 2022-05-18 DIAGNOSIS — E669 Obesity, unspecified: Secondary | ICD-10-CM | POA: Diagnosis not present

## 2022-05-18 DIAGNOSIS — R269 Unspecified abnormalities of gait and mobility: Secondary | ICD-10-CM

## 2022-05-18 DIAGNOSIS — E1169 Type 2 diabetes mellitus with other specified complication: Secondary | ICD-10-CM

## 2022-05-18 LAB — LIPID PANEL
Cholesterol: 158 (ref 0–200)
HDL: 37 (ref 35–70)
LDL Cholesterol: 93
Triglycerides: 187 — AB (ref 40–160)

## 2022-05-18 LAB — HEPATIC FUNCTION PANEL
ALT: 13 U/L (ref 7–35)
AST: 11 — AB (ref 13–35)
Alkaline Phosphatase: 100 (ref 25–125)
Bilirubin, Total: 0.3

## 2022-05-18 LAB — CBC AND DIFFERENTIAL
HCT: 38 (ref 36–46)
Hemoglobin: 12.4 (ref 12.0–16.0)
Platelets: 259 10*3/uL (ref 150–400)
WBC: 9.8

## 2022-05-18 LAB — BASIC METABOLIC PANEL
BUN: 23 — AB (ref 4–21)
CO2: 28 — AB (ref 13–22)
Chloride: 105 (ref 99–108)
Creatinine: 0.7 (ref 0.5–1.1)
Glucose: 123
Potassium: 4.4 mEq/L (ref 3.5–5.1)
Sodium: 142 (ref 137–147)

## 2022-05-18 LAB — COMPREHENSIVE METABOLIC PANEL
Albumin: 3.4 — AB (ref 3.5–5.0)
Calcium: 8.7 (ref 8.7–10.7)
Globulin: 2.7

## 2022-05-18 LAB — HEMOGLOBIN A1C: Hemoglobin A1C: 7.2

## 2022-05-18 LAB — CBC: RBC: 4.66 (ref 3.87–5.11)

## 2022-05-18 NOTE — Progress Notes (Signed)
[facility] Consult Note Telephone: [phone]  Fax: [phone]   Date of encounter: 05/18/22 2:05 PM PATIENT NAME: Lisa Castillo [id number]   [phone] (home)  DOB: 1928/04/22 MRN: [id number] PRIMARY CARE PROVIDER:    [name], MD,  [place] [id number]  REFERRING PROVIDER:   [name], MD [place],  [place] [address] [phone]  Health Care Agent/Health Care Power of Attorney:    Contact Information     Name Relation Home Work House Daughter [phone]  [phone]        I met face to face with patient in [facility].  Palliative Care was asked to follow this patient by consultation request of [name], MD to address This is a follow up visit.   HEALTH CARE POA/HEALTH CARE DECISION MAKER AND PHONE NUMBER:    CODE STATUS:  DNR   ASSESSMENT AND / RECOMMENDATIONS:  PPS:50%  Dementia with behavioral disturbance-  Recent increase in behaviors and elopement attempts Continue current treatment regimen (Depakote 125 mg DR PO QD, Sertraline 50 mg daily and Seroquel 37.5 mg at 1400 daily)  Consider either Depakote increase to 250 mg if behaviors persist/increase/worsen or if depressive symptoms increase in Sertraline to 100 mg daily.  Alprazolam 0.5 mg PO BID prn agitation/anxiety.  Monitor behaviors for patient and staff safety, reorient as needed.   2. Diabetes Mellitus Type 2 -    Worsening control, HGB A1C 7.0%. Last Blood Glucose 146.   Asymptomatic at present, monitor for hyper and hypoglycemia.   Recommend repeat Hgb A1c in 3 months and if increasing,             consider adding Januvia 50 mg in am daily.             Recommend either eliminating Boost at HS or change to             Boost Glucose Control or Glucerna.  3.   Gait abnormality             Recommend encouraging  participation in activities, even              chair exercises to help maintain strength for transfers             Recommend periodic PT assessment of same with              redemonstration of  HEP to maintain ability for bed mobility,             weight redistribution in chair and bed, ability for transfers.     Follow up Palliative Care Visit:  Palliative Care continuing to follow up by monitoring for changes in appetite, weight, functional and cognitive status for chronic disease progression and management in agreement with patient's stated goals of care. Next visit in 4-6 weeks or prn.  This visit was coded based on medical decision making (MDM).  Chief Complaint  Palliative Care is continuing to follow patient for chronic medical management in setting of dementia and to assist with refining and defining goals of care.   HISTORY OF PRESENT ILLNESS: Lisa Castillo is a 89 y.o. year old female with dementia, depression with anxiety, HTN, left tubo-ovarian mass, frequent falls, hx of rectal bleed with anemia, HLD, gait abnormality, OA and osteopenia, DM and hypothyroidism.  Staff report increased wandering behaviors with  recent attempts at elopement x 2. No recent medication changes, no recent UTI or new disease process. Staff and patient deny any recent falls. WC dependent and continues to require staff assistance with ADL's. Patient states that she is doing alright and has no new complaints at this time.  Denies pain.  Denies SOB or Chest Pain.    ACTIVITIES OF DAILY LIVING: CONTINENT OF BLADDER? Intermittent CONTINENT OF BOWEL? Intermittent Bathing/Dressing : Dependent  MOBILITY:   AMBULATORY WITH ASSISTIVE DEVICE:  WHEELCHAIR Dependent    APPETITE? Consumes majority of meals without difficulty - BOOST Supplement QHS  CURRENT PROBLEM LIST:  Patient Active Problem List   Diagnosis Date Noted   Osteoarthritis, multiple sites 10/20/2020   Urinary retention 04/15/2020   Anemia,  blood loss 04/15/2020   Diabetes mellitus type 2 in nonobese ([place]) 04/07/2020   Dementia with behavioral disturbance ([place]) 04/07/2020   Frequent falls 02/25/2020   HTN (hypertension) 02/04/2020   Rectal bleed 01/18/2020   Depression with anxiety 12/31/2019   Left tubo-ovarian mass 12/31/2019   Slow transit constipation 12/31/2019   Osteopenia 06/14/2018   Diabetes mellitus type 2 in obese ([place]) 11/09/2017   Obesity (BMI 30-39.9) 01/19/2017   Mixed hyperlipidemia 06/29/2016   Gait abnormality 04/27/2016   Hearing loss    Hypothyroidism    Insomnia    PAST MEDICAL HISTORY:  Active Ambulatory Problems    Diagnosis Date Noted   Hearing loss    Hypothyroidism    Insomnia    Gait abnormality 04/27/2016   Mixed hyperlipidemia 06/29/2016   Obesity (BMI 30-39.9) 01/19/2017   Diabetes mellitus type 2 in obese ([place]) 11/09/2017   Osteopenia 06/14/2018   Depression with anxiety 12/31/2019   Left tubo-ovarian mass 12/31/2019   Slow transit constipation 12/31/2019   Rectal bleed 01/18/2020   HTN (hypertension) 02/04/2020   Frequent falls 02/25/2020   Diabetes mellitus type 2 in nonobese ([place]) 04/07/2020   Dementia with behavioral disturbance ([place]) 04/07/2020   Urinary retention 04/15/2020   Anemia, blood loss 04/15/2020   Osteoarthritis, multiple sites 10/20/2020   Resolved Ambulatory Problems    Diagnosis Date Noted   Hyperglycemia 06/29/2016   Prediabetes 01/19/2017   Malaise and fatigue 04/03/2019   Senile dementia, delirium ([place]) 04/03/2019   Tachycardia 04/03/2019   Burst fracture of lumbar vertebra, sequela 12/27/2019   Right lower lobe pneumonia 01/14/2020   Hypokalemia 01/21/2020   Closed left hip fracture, initial encounter ([place]) 04/07/2020   Unspecified fracture of the lower end of left radius, initial encounter for closed fracture 04/07/2020   Fall as cause of accidental injury in residential institution as place of occurrence 04/07/2020   Closed left hip fracture  ([place]) 04/07/2020   Past Medical History:  Diagnosis Date   History of fracture of clavicle    Tinnitus    SOCIAL HX:  Social History   Tobacco Use   Smoking status: Never   Smokeless tobacco: Never  Substance Use Topics   Alcohol use: Yes    Comment: 4 per week   FAMILY HX:  Family History  Problem Relation Age of Onset   Heart disease Mother    Heart disease Father        Preferred Pharmacy: ALLERGIES:  Allergies  Allergen Reactions   Metformin And Related Diarrhea     PERTINENT MEDICATIONS:  Outpatient Encounter Medications as of 05/18/2022  Medication Sig   acetaminophen (TYLENOL) 325 MG tablet Take 650 mg by mouth 3 (three) times daily as needed.   ALPRAZolam (  XANAX) 0.5 MG tablet Take 0.5 mg by mouth 2 (two) times daily as needed for anxiety.   Cholecalciferol (D3) 50 MCG (2000 UT) TABS Take by mouth.   DIMETHICONE, TOPICAL, (CERAVE BABY) 1 % LOTN Apply 1 application topically daily.   divalproex (DEPAKOTE) 125 MG DR tablet Take 125 mg by mouth daily.   docusate sodium (COLACE) 100 MG capsule Take 100 mg by mouth daily.   Emollient (CERAVE) LOTN Apply topically every other day. Apply to shoulders, back, and chest   hydrocortisone cream 1 % Apply 1 application  topically as needed. Apply to back, abdomen, and chest   ipratropium-albuterol (DUONEB) 0.5-2.5 (3) MG/3ML SOLN Take 3 mLs by nebulization every 6 (six) hours as needed.   levothyroxine (SYNTHROID) 75 MCG tablet TAKE 1 TABLET (75 MCG TOTAL) BY MOUTH DAILY BEFORE BREAKFAST.   metoprolol tartrate (LOPRESSOR) 25 MG tablet Take 12.5 mg by mouth 2 (two) times daily.   ondansetron (ZOFRAN) 4 MG tablet Take 1 tablet (4 mg total) by mouth 2 (two) times daily as needed for nausea or vomiting.   polyethylene glycol (MIRALAX / GLYCOLAX) 17 g packet Take 17 g by mouth daily as needed for moderate constipation.   QUEtiapine (SEROQUEL) 25 MG tablet Take 37.5 mg by mouth daily at 2 PM. Hold 09/13- 09/18 while on paxlovid    sertraline (ZOLOFT) 50 MG tablet Take 50 mg by mouth daily.   zinc oxide 20 % ointment Apply 1 application topically as needed for irritation.   No facility-administered encounter medications on file as of 05/18/2022.    History obtained from review of EMR, interview with facility staff/caregiver and patient.      Latest Ref Rng & Units 04/23/2022   12:00 AM 11/05/2021   12:00 AM 05/11/2021   12:00 AM  CBC  WBC  10.3     8.5     8.8      Hemoglobin 12.0 - 16.0 13.4     13.0     10.6      Hematocrit 36 - 46 41     40     34      Platelets 150 - 400 K/uL 280     257     301         This result is from an external source.       Latest Ref Rng & Units 04/23/2022   12:00 AM 11/05/2021   12:00 AM 05/11/2021   12:00 AM  CMP  BUN 4 - 21 22     23     20       Creatinine 0.5 - 1.1 0.7     0.7     0.5      Sodium 137 - 147 137     138     142      Potassium 3.5 - 5.1 mEq/L 4.4     4.2     4.0      Chloride 99 - 108 101     101     107      CO2 13 - 22 26     31     27       Calcium 8.7 - 10.7 9.2     9.4     8.8      Alkaline Phos 25 - 125 95     96     76      AST 13 - 35 14  14     10      ALT 7 - 35 U/L 10     13     8          This result is from an external source.       Latest Ref Rng & Units 04/23/2022   12:00 AM 11/05/2021   12:00 AM 05/11/2021   12:00 AM  Hepatic Function  Albumin 3.5 - 5.0 3.8     3.9     3.2      AST 13 - 35 14     14     10       ALT 7 - 35 U/L 10     13     8       Alk Phosphatase 25 - 125 95     96     76         This result is from an external source.   04/23/22 HGB A1c 7.0% (02/24/22 6.5%, 11/05/21 5.9%)  I reviewed available labs, medications, imaging, studies and related documents from the EMR.  Records summarized above.   Physical Exam:  Current Weight: 152 lbs 11.2 oz on 04/30/22, 153.5 on 04/01/22  Blood Glucose 146 0700 05/18/22 GENERAL: NAD LUNGS: CTAB, no increased work of breathing, room air CARDIAC:  S1S2, RRR with no M/R/G, No  edema/cyanosis ABD:  Normo-active BS x 4 quads, soft, non-tender, no ascites Skin: Warm/Dry/Intact without lesion or wounds. No bruising  EXTREMITIES: Limited ROM, moves all extremities, no deformity, No muscle atrophy/subcutaneous fat loss NEURO:  No weakness, mild cognitive impairment, forgetful with long term recall intact  PSYCH:  flat, non-anxious affect, A & O x 2   Thank you for the opportunity to participate in the care of [facility]. Please call our main office at [phone] if we can be of additional assistance.    Damaris Hippo FNP-C  [email or web address]  Phone:  [phone]

## 2022-05-19 ENCOUNTER — Encounter: Payer: Self-pay | Admitting: Orthopedic Surgery

## 2022-05-19 ENCOUNTER — Non-Acute Institutional Stay (SKILLED_NURSING_FACILITY): Payer: Medicare Other | Admitting: Orthopedic Surgery

## 2022-05-19 DIAGNOSIS — I10 Essential (primary) hypertension: Secondary | ICD-10-CM | POA: Diagnosis not present

## 2022-05-19 DIAGNOSIS — F418 Other specified anxiety disorders: Secondary | ICD-10-CM | POA: Diagnosis not present

## 2022-05-19 DIAGNOSIS — E039 Hypothyroidism, unspecified: Secondary | ICD-10-CM

## 2022-05-19 DIAGNOSIS — E781 Pure hyperglyceridemia: Secondary | ICD-10-CM | POA: Diagnosis not present

## 2022-05-19 DIAGNOSIS — F01518 Vascular dementia, unspecified severity, with other behavioral disturbance: Secondary | ICD-10-CM

## 2022-05-19 DIAGNOSIS — E119 Type 2 diabetes mellitus without complications: Secondary | ICD-10-CM | POA: Diagnosis not present

## 2022-05-19 DIAGNOSIS — E782 Mixed hyperlipidemia: Secondary | ICD-10-CM | POA: Diagnosis not present

## 2022-05-19 DIAGNOSIS — R635 Abnormal weight gain: Secondary | ICD-10-CM | POA: Diagnosis not present

## 2022-05-19 NOTE — Progress Notes (Signed)
Location:   [facility] Room Number: 39-A Place of Service:  SNF [phone]) Provider:  Windell Moulding, NP  PCP: [name], MD  Patient Care Team: [name], MD as PCP - General (Internal Medicine)  Extended Emergency Contact Information Primary Emergency Contact: Harris,Margaret Address: [address]          [place], [place] [address] [name] of [place] Phone: [phone] Mobile Phone: [phone] Relation: Daughter  Code Status:  DNR Goals of care: Advanced Directive information    05/19/2022    9:18 AM  Advanced Directives  Does Patient Have a Medical Advance Directive? Yes  Type of Paramedic of [place];Living will;Out of facility DNR (pink MOST or yellow form)  Does patient want to make changes to medical advance directive? No - Patient declined  Copy of [place] in Chart? Yes - validated most recent copy scanned in chart (See row information)     Chief Complaint  Patient presents with   Medical Management of Chronic Issues    Routine Visit.    HPI:  Pt is a 89 y.o. female seen today for medical management of chronic diseases.    She currently resides on the skilled nursing unit at [facility]. Past medical includes: HTN, constipation, T2DM, hypothyroidism, OA, urinary retention, vascular dementia, depression and anxiety.    T2DM- A1c 7.2 (03/8)> was 6.5 (12/22)> was  5.9 11/05/2021, blood sugars averaging 120-140's, no hypoglycemic events, on regular/NAS diet Dementia- followed by palliative, BIMS 6/15 (02/27)> was 7/15 (11/24), CT head 04/2020 age related atrophy and chronic small vessel ischemia, some agitation in late afternoon, poor safety awareness, remains on Depakote and Seroquel HTN- BUN/creat 23/0.70 05/17/2022, remains on metoprolol  HLD- LDL 93 05/17/2022, off statin Hypertriglyceridemia- triglycerides 187 05/17/2022 Hypothyroidism- TSH 2.88 11/05/2021, remains on  levothyroxine  Depression/anxiety- no mood changes or panic attacks, good family support, remains on Zoloft, Na+ 142 05/17/2022 Weight gain- weights up 20 lbs since 05/2021, TSH 2.88 11/05/2021, see trends below  Recent blood pressures:  03/19- 151/87  03/12- 130/70  03/05- 121/62  Recent weights:  03/01- 153.5 lbs  02/01- 152.7 lbs  01/01- 149.1 lbs    Past Medical History:  Diagnosis Date   Burst fracture of lumbar vertebra, sequela 12/27/2019   Closed left hip fracture ([place]) 04/07/2020   Fall as cause of accidental injury in residential institution as place of occurrence 04/07/2020   Hearing loss    History of fracture of clavicle    Hypokalemia 01/21/2020   01/21/20 Na 141, K 4.1, Bun 12, creat 0.65, eGFR 79, wbc 9.2, Hgb 12.7, plt 331   Hypothyroidism    Insomnia    Right lower lobe pneumonia 01/14/2020   Tachycardia 04/03/2019   Tinnitus    Unspecified fracture of the lower end of left radius, initial encounter for closed fracture 04/07/2020   Past Surgical History:  Procedure Laterality Date   CHOLECYSTECTOMY  2007   Dr. Verdene Lennert   CLAVICLE EXCISION  1986   INTRAMEDULLARY (IM) NAIL INTERTROCHANTERIC Left 04/09/2020   Procedure: LEFT INTERTROCHANTERIC INTRAMEDULLARY (IM) NAIL;  Surgeon: Leandrew Koyanagi, MD;  Location: [facility];  Service: Orthopedics;  Laterality: Left;   MOHS SURGERY     past 10 years chest & legs   OPEN REDUCTION INTERNAL FIXATION (ORIF) DISTAL RADIAL FRACTURE Left 04/09/2020   Procedure: CLOSED REDUCTION LEFT DISTAL RADIUS FRACTURE;  Surgeon: Leandrew Koyanagi, MD;  Location: [facility];  Service:  Orthopedics;  Laterality: Left;    Allergies  Allergen Reactions   Metformin And Related Diarrhea    Allergies as of 05/19/2022       Reactions   Metformin And Related Diarrhea        Medication List        Accurate as of May 19, 2022  9:18 AM. If you have any questions, ask your nurse or doctor.          STOP taking these medications    ALPRAZolam  0.5 MG tablet Commonly known as: XANAX Stopped by: Yvonna Alanis, NP       TAKE these medications    acetaminophen 325 MG tablet Commonly known as: TYLENOL Take 650 mg by mouth 3 (three) times daily as needed.   CeraVe Baby 1 % Lotn Generic drug: DIMETHICONE (TOPICAL) Apply 1 application topically daily.   CeraVe Lotn Apply topically every other day. Apply to shoulders, back, and chest   D3 50 MCG (2000 UT) Tabs Generic drug: Cholecalciferol Take by mouth.   divalproex 125 MG DR tablet Commonly known as: DEPAKOTE Take 125 mg by mouth daily.   docusate sodium 100 MG capsule Commonly known as: COLACE Take 100 mg by mouth daily.   hydrocortisone cream 1 % Apply 1 application  topically as needed. Apply to back, abdomen, and chest   ipratropium-albuterol 0.5-2.5 (3) MG/3ML Soln Commonly known as: DUONEB Take 3 mLs by nebulization every 6 (six) hours as needed.   levothyroxine 75 MCG tablet Commonly known as: SYNTHROID TAKE 1 TABLET (75 MCG TOTAL) BY MOUTH DAILY BEFORE BREAKFAST.   metoprolol tartrate 25 MG tablet Commonly known as: LOPRESSOR Take 12.5 mg by mouth 2 (two) times daily.   ondansetron 4 MG tablet Commonly known as: Zofran Take 1 tablet (4 mg total) by mouth 2 (two) times daily as needed for nausea or vomiting.   polyethylene glycol 17 g packet Commonly known as: MIRALAX / GLYCOLAX Take 17 g by mouth daily as needed for moderate constipation.   QUEtiapine 25 MG tablet Commonly known as: SEROQUEL Take 37.5 mg by mouth daily at 2 PM. Hold 09/13- 09/18 while on paxlovid   sertraline 50 MG tablet Commonly known as: ZOLOFT Take 50 mg by mouth daily.   zinc oxide 20 % ointment Apply 1 application topically as needed for irritation.        Review of Systems  Unable to perform ROS: Dementia    Immunization History  Administered Date(s) Administered   DTaP 07/20/2013   Fluad Quad(high Dose 65+) 12/23/2021   Influenza, High Dose Seasonal PF  12/07/2016   Influenza,inj,Quad PF,6+ Mos 12/01/2017   Influenza-Unspecified 11/20/2014, 12/12/2015, 11/20/2019   Moderna SARS-COV2 Booster Vaccination 08/06/2020   Moderna Sars-Covid-2 Vaccination 03/05/2019, 04/02/2019   PFIZER(Purple Top)SARS-COV-2 Vaccination 04/01/2020, 08/06/2020, 11/19/2020, 01/05/2022   PPD Test 08/06/2014   Pneumococcal Conjugate-13 11/07/2013   Pneumococcal Polysaccharide-23 03/22/2017   Tetanus 03/02/2015   Zoster Recombinat (Shingrix) 06/07/2017, 08/26/2017   Zoster, Live 03/01/2006   Pertinent  Health Maintenance Due  Topic Date Due   HEMOGLOBIN A1C  10/22/2022   OPHTHALMOLOGY EXAM  11/19/2022   FOOT EXAM  02/18/2023   INFLUENZA VACCINE  Completed   DEXA SCAN  Completed      04/13/2020    8:56 AM 04/13/2020    8:00 PM 04/14/2020    7:47 AM 09/12/2020   12:07 PM 09/28/2021   10:30 AM  Fall Risk  Falls in the past year?  1 1  Was there an injury with Fall?    1 0  Fall Risk Category Calculator    3 2  Fall Risk Category (Retired)    High Moderate  (RETIRED) Patient Fall Risk Level High fall risk High fall risk High fall risk  Moderate fall risk  Patient at Risk for Falls Due to    History of fall(s);Impaired balance/gait;Impaired mobility;Mental status change History of fall(s);Impaired balance/gait;Impaired mobility  Fall risk Follow up    Falls evaluation completed;Education provided;Falls prevention discussed Falls evaluation completed;Education provided;Falls prevention discussed   Functional Status Survey:    Vitals:   05/19/22 0913  BP: (!) 151/87  Pulse: 100  Resp: 18  Temp: (!) 97.1 F (36.2 C)  SpO2: 95%  Weight: 152 lb 11.2 oz (69.3 kg)  Height: 5' (1.524 m)   Body mass index is 29.82 kg/m. Physical Exam Vitals reviewed.  Constitutional:      General: She is not in acute distress. HENT:     Head: Normocephalic.  Eyes:     General:        Right eye: No discharge.        Left eye: No discharge.  Cardiovascular:     Rate  and Rhythm: Normal rate and regular rhythm.     Pulses: Normal pulses.     Heart sounds: Normal heart sounds.  Pulmonary:     Effort: Pulmonary effort is normal.     Breath sounds: Normal breath sounds.  Abdominal:     General: Bowel sounds are normal. There is no distension.     Palpations: Abdomen is soft.     Tenderness: There is no abdominal tenderness.  Musculoskeletal:     Cervical back: Neck supple.     Right lower leg: No edema.     Left lower leg: No edema.  Skin:    General: Skin is warm and dry.     Capillary Refill: Capillary refill takes less than 2 seconds.  Neurological:     General: No focal deficit present.     Mental Status: She is alert. Mental status is at baseline.     Motor: Weakness present.     Gait: Gait abnormal.     Comments: Hoyer/wheelchair  Psychiatric:        Mood and Affect: Mood normal.     Comments: Follows commands, alert to self/person, very pleasant     Labs reviewed: Recent Labs    11/05/21 0000 04/23/22 0000  NA 138 137  K 4.2 4.4  CL 101 101  CO2 31* 26*  BUN 23* 22*  CREATININE 0.7 0.7  CALCIUM 9.4 9.2   Recent Labs    11/05/21 0000 04/23/22 0000  AST 14 14  ALT 13 10  ALKPHOS 96 95  ALBUMIN 3.9 3.8   Recent Labs    11/05/21 0000 04/23/22 0000  WBC 8.5 10.3  NEUTROABS  --  6,489.00  HGB 13.0 13.4  HCT 40 41  PLT 257 280   Lab Results  Component Value Date   TSH 2.88 11/05/2021   Lab Results  Component Value Date   HGBA1C 7.0 04/23/2022   Lab Results  Component Value Date   CHOL 169 04/23/2022   HDL 38 04/23/2022   LDLCALC 95 04/23/2022   TRIG 236 (A) 04/23/2022   CHOLHDL 3.0 12/13/2019    Significant Diagnostic Results in last 30 days:  No results found.  Assessment/Plan 1. Diabetes mellitus type 2 in nonobese (HCC) - A1c  7.2 (03/18)> was 6.5 (12/22)  - associated with 20 lbs weight gain - metformin allergy - will try dietary precautions - start carb-mod diet - limit snacking - offer  Glucerna if meal skipped - plan to recheck A1c in 3 months  2. Vascular dementia with behavior disturbance ([place]) - followed by palliative - sleeping more per daughter - ativan discontinued - dependent with ADLs except feeding  - cont Depakote and Seroquel  3. Primary hypertension - controlled - cont metoprolol  4. Mixed hyperlipidemia - LDL 93 05/17/2022 - off statin  5. Hypertriglyceridemia - Triglycerides 187 05/17/2022  6. Acquired hypothyroidism - TSH 2.88 10/2021 - cont levothyroxine  7. Depression with anxiety - no mood changes - cont sertraline  8. Weight gain - up 20 lbs in past year - see above - cont monthly weights    Family/ staff Communication: plan discussed with patient, daughter and nurse  Labs/tests ordered:  A1c in 3 months

## 2022-05-22 ENCOUNTER — Encounter: Payer: Self-pay | Admitting: Family Medicine

## 2022-06-14 ENCOUNTER — Encounter: Payer: Self-pay | Admitting: Orthopedic Surgery

## 2022-06-14 ENCOUNTER — Non-Acute Institutional Stay (SKILLED_NURSING_FACILITY): Payer: Medicare Other | Admitting: Orthopedic Surgery

## 2022-06-14 DIAGNOSIS — F411 Generalized anxiety disorder: Secondary | ICD-10-CM | POA: Diagnosis not present

## 2022-06-14 DIAGNOSIS — F01518 Vascular dementia, unspecified severity, with other behavioral disturbance: Secondary | ICD-10-CM | POA: Diagnosis not present

## 2022-06-14 NOTE — Progress Notes (Signed)
Location:   [facility] Room Number: 39-A Place of Service:  SNF [phone]) Provider:  Hazle Nordmann, NP  PCP: Mahlon Gammon, MD  Patient Care Team: Mahlon Gammon, MD as PCP - General (Internal Medicine)  Extended Emergency Contact Information Primary Emergency Contact: Harris,Margaret Address: [address]          [place], [place] [address] D[place] of [place] Home Phone: [phone] Mobile Phone: [phone] Relation: Daughter  Code Status:  DNR Goals of care: Advanced Directive information    06/14/2022   10:02 AM  Advanced Directives  Does Patient Have a Medical Advance Directive? Yes  Type of Estate agent of [place];Living will;Out of facility DNR (pink MOST or yellow form)  Does patient want to make changes to medical advance directive? No - Patient declined  Copy of Healthcare Power of Attorney in Chart? Yes - validated most recent copy scanned in chart (See row information)     Chief Complaint  Patient presents with   Acute Visit    Increased anxiety/sundowning.     HPI:  Pt is a 89 y.o. female seen today for an acute visit due to increased anxiety/sundowning.   She currently resides on the skilled nursing unit at [facility]. Past medical includes: HTN, constipation, T2DM, hypothyroidism, OA, urinary retention, vascular dementia, depression and anxiety.    Poor historian due to advanced dementia. Nursing reports increased anxiety and exit seeking behaviors from 3 pm until 11 pm. Behaviors do not occur daily, but 3-4 times x 2 weeks. She was off xanax prn due to nonuse. Remains on Depakote, Seroquel and Zoloft. Afebrile. Vitals stable.    Past Medical History:  Diagnosis Date   Burst fracture of lumbar vertebra, sequela 12/27/2019   Closed left hip fracture 04/07/2020   Diabetes mellitus type 2 in obese 11/09/2017   12/13/19 Hgb a1c 6.8, Na 141, K 4.3, Bun 16, creat 0.78, eGFR 67, wbc 9.6, Hgb 12.6, plt 318,  neutrophils 60.9%, LDL 51  01/10/20 wbc 14.3, Hgb 13.6, plt 523, neutrophils 70.5%, Na 142, K 3.3, Bun 27, creat 0.62, eGFR 80  01/31/20 eye exam when the patient is able.   01/08/21 Hgb a1c 5.5   Fall as cause of accidental injury in residential institution as place of occurrence 04/07/2020   Hearing loss    History of fracture of clavicle    Hypokalemia 01/21/2020   01/21/20 Na 141, K 4.1, Bun 12, creat 0.65, eGFR 79, wbc 9.2, Hgb 12.7, plt 331   Hypothyroidism    Insomnia    Right lower lobe pneumonia 01/14/2020   Tachycardia 04/03/2019   Tinnitus    Unspecified fracture of the lower end of left radius, initial encounter for closed fracture 04/07/2020   Past Surgical History:  Procedure Laterality Date   CHOLECYSTECTOMY  2007   Dr. Alan Mulder   CLAVICLE EXCISION  1986   INTRAMEDULLARY (IM) NAIL INTERTROCHANTERIC Left 04/09/2020   Procedure: LEFT INTERTROCHANTERIC INTRAMEDULLARY (IM) NAIL;  Surgeon: Tarry Kos, MD;  Location: [facility] OR;  Service: Orthopedics;  Laterality: Left;   MOHS SURGERY     past 10 years chest & legs   OPEN REDUCTION INTERNAL FIXATION (ORIF) DISTAL RADIAL FRACTURE Left 04/09/2020   Procedure: CLOSED REDUCTION LEFT DISTAL RADIUS FRACTURE;  Surgeon: Tarry Kos, MD;  Location: [facility] OR;  Service: Orthopedics;  Laterality: Left;    Allergies  Allergen Reactions   Metformin And Related Diarrhea    Allergies as of 06/14/2022  Reactions   Metformin And Related Diarrhea        Medication List        Accurate as of June 14, 2022 10:02 AM. If you have any questions, ask your nurse or doctor.          acetaminophen 325 MG tablet Commonly known as: TYLENOL Take 650 mg by mouth 3 (three) times daily as needed.   ALPRAZolam 0.5 MG tablet Commonly known as: XANAX Take 0.5 mg by mouth every 12 (twelve) hours as needed for anxiety.   CeraVe Baby 1 % Lotn Generic drug: DIMETHICONE (TOPICAL) Apply 1 Application topically daily. Apply to shoulders, back, and  chest.   CeraVe Lotn Apply 1 Application topically as directed. Apply to back and chest topically every shift every 2 days for dry skin.   CeraVe Lotn Apply 1 Application topically daily. Apply to back and chest.   D3 50 MCG (2000 UT) Tabs Generic drug: Cholecalciferol Take by mouth.   divalproex 125 MG DR tablet Commonly known as: DEPAKOTE Take 125 mg by mouth daily.   docusate sodium 100 MG capsule Commonly known as: COLACE Take 100 mg by mouth daily.   Glucerna Liqd Take 240 mLs by mouth as needed.   hydrocortisone cream 1 % Apply 1 application  topically as needed. Apply to back, abdomen, and chest   ipratropium-albuterol 0.5-2.5 (3) MG/3ML Soln Commonly known as: DUONEB Take 3 mLs by nebulization every 6 (six) hours as needed.   levothyroxine 75 MCG tablet Commonly known as: SYNTHROID TAKE 1 TABLET (75 MCG TOTAL) BY MOUTH DAILY BEFORE BREAKFAST.   metoprolol tartrate 25 MG tablet Commonly known as: LOPRESSOR Take 12.5 mg by mouth 2 (two) times daily.   ondansetron 4 MG tablet Commonly known as: Zofran Take 1 tablet (4 mg total) by mouth 2 (two) times daily as needed for nausea or vomiting.   polyethylene glycol 17 g packet Commonly known as: MIRALAX / GLYCOLAX Take 17 g by mouth daily as needed for moderate constipation.   PreviDent 5000 Booster Plus 1.1 % Pste Generic drug: Sodium Fluoride Place 1 Application onto teeth in the morning and at bedtime.   QUEtiapine 25 MG tablet Commonly known as: SEROQUEL Take 37.5 mg by mouth daily at 2 PM. Hold 09/13- 09/18 while on paxlovid   sertraline 50 MG tablet Commonly known as: ZOLOFT Take 50 mg by mouth daily.   zinc oxide 20 % ointment Apply 1 application topically as needed for irritation.        Review of Systems  Unable to perform ROS: Dementia    Immunization History  Administered Date(s) Administered   DTaP 07/20/2013   Fluad Quad(high Dose 65+) 12/23/2021   Influenza, High Dose Seasonal PF  12/07/2016   Influenza,inj,Quad PF,6+ Mos 12/01/2017   Influenza-Unspecified 11/20/2014, 12/12/2015, 11/20/2019   Moderna SARS-COV2 Booster Vaccination 08/06/2020   Moderna Sars-Covid-2 Vaccination 03/05/2019, 04/02/2019   PFIZER(Purple Top)SARS-COV-2 Vaccination 04/01/2020, 08/06/2020, 11/19/2020, 01/05/2022   PPD Test 08/06/2014   Pneumococcal Conjugate-13 11/07/2013   Pneumococcal Polysaccharide-23 03/22/2017   Tetanus 03/02/2015   Zoster Recombinat (Shingrix) 06/07/2017, 08/26/2017   Zoster, Live 03/01/2006   Pertinent  Health Maintenance Due  Topic Date Due   INFLUENZA VACCINE  09/30/2022   HEMOGLOBIN A1C  10/22/2022   OPHTHALMOLOGY EXAM  11/19/2022   FOOT EXAM  02/18/2023   DEXA SCAN  Completed      04/13/2020    8:00 PM 04/14/2020    7:47 AM 09/12/2020   12:07 PM  09/28/2021   10:30 AM 05/19/2022   12:28 PM  Fall Risk  Falls in the past year?   1 1 1   Was there an injury with Fall?   1 0 0  Fall Risk Category Calculator   3 2 1   Fall Risk Category (Retired)   High Moderate   (RETIRED) Patient Fall Risk Level High fall risk High fall risk  Moderate fall risk   Patient at Risk for Falls Due to   History of fall(s);Impaired balance/gait;Impaired mobility;Mental status change History of fall(s);Impaired balance/gait;Impaired mobility History of fall(s);Impaired balance/gait;Impaired mobility  Fall risk Follow up   Falls evaluation completed;Education provided;Falls prevention discussed Falls evaluation completed;Education provided;Falls prevention discussed Falls evaluation completed;Education provided;Falls prevention discussed   Functional Status Survey:    Vitals:   06/14/22 0940  BP: 137/77  Pulse: 88  Resp: 18  Temp: (!) 96.9 F (36.1 C)  SpO2: 95%  Weight: 153 lb 8 oz (69.6 kg)  Height: 5' (1.524 m)   Body mass index is 29.98 kg/m. Physical Exam Vitals reviewed.  Constitutional:      General: She is not in acute distress. HENT:     Head: Normocephalic.   Eyes:     General:        Right eye: No discharge.        Left eye: No discharge.  Cardiovascular:     Rate and Rhythm: Normal rate and regular rhythm.     Pulses: Normal pulses.     Heart sounds: Normal heart sounds.  Pulmonary:     Effort: Pulmonary effort is normal. No respiratory distress.     Breath sounds: Normal breath sounds. No wheezing.  Abdominal:     General: Bowel sounds are normal. There is no distension.     Palpations: Abdomen is soft.     Tenderness: There is no abdominal tenderness.  Musculoskeletal:     Cervical back: Neck supple.     Right lower leg: No edema.     Left lower leg: No edema.  Skin:    General: Skin is warm and dry.     Capillary Refill: Capillary refill takes less than 2 seconds.  Neurological:     General: No focal deficit present.     Mental Status: She is alert. Mental status is at baseline.     Motor: Weakness present.     Gait: Gait abnormal.     Comments: wheelchair  Psychiatric:        Mood and Affect: Mood normal.     Comments: Very pleasant, follows commands, alert to self and familiar face     Labs reviewed: Recent Labs    11/05/21 0000 04/23/22 0000  NA 138 137  K 4.2 4.4  CL 101 101  CO2 31* 26*  BUN 23* 22*  CREATININE 0.7 0.7  CALCIUM 9.4 9.2   Recent Labs    11/05/21 0000 04/23/22 0000  AST 14 14  ALT 13 10  ALKPHOS 96 95  ALBUMIN 3.9 3.8   Recent Labs    11/05/21 0000 04/23/22 0000  WBC 8.5 10.3  NEUTROABS  --  6,489.00  HGB 13.0 13.4  HCT 40 41  PLT 257 280   Lab Results  Component Value Date   TSH 2.88 11/05/2021   Lab Results  Component Value Date   HGBA1C 7.0 04/23/2022   Lab Results  Component Value Date   CHOL 169 04/23/2022   HDL 38 04/23/2022   LDLCALC 95 04/23/2022  TRIG 236 (A) 04/23/2022   CHOLHDL 3.0 12/13/2019    Significant Diagnostic Results in last 30 days:  No results found.  Assessment/Plan 1. Generalized anxiety disorder - ongoing - associated with  dementia - restart xanax 0.5 mg po BID prn- failed GDR - cont Zoloft> consider increased dose if behaviors continue  2. Vascular dementia with behavior disturbance - followed by palliative - episodes of increased anxiety and exit seeking 1-2x/week> see above - dependent with ADLs except feeding - weights stable - ambulates with wheelchair - cont Depakote and Seroquel   Family/ staff Communication: plan discussed with patient and nurse  Labs/tests ordered:  none

## 2022-06-25 ENCOUNTER — Non-Acute Institutional Stay (SKILLED_NURSING_FACILITY): Payer: Medicare Other | Admitting: Orthopedic Surgery

## 2022-06-25 ENCOUNTER — Encounter: Payer: Self-pay | Admitting: Orthopedic Surgery

## 2022-06-25 DIAGNOSIS — E119 Type 2 diabetes mellitus without complications: Secondary | ICD-10-CM | POA: Diagnosis not present

## 2022-06-25 DIAGNOSIS — I700 Atherosclerosis of aorta: Secondary | ICD-10-CM

## 2022-06-25 DIAGNOSIS — E782 Mixed hyperlipidemia: Secondary | ICD-10-CM

## 2022-06-25 DIAGNOSIS — E039 Hypothyroidism, unspecified: Secondary | ICD-10-CM

## 2022-06-25 DIAGNOSIS — F339 Major depressive disorder, recurrent, unspecified: Secondary | ICD-10-CM

## 2022-06-25 DIAGNOSIS — R635 Abnormal weight gain: Secondary | ICD-10-CM | POA: Diagnosis not present

## 2022-06-25 DIAGNOSIS — I10 Essential (primary) hypertension: Secondary | ICD-10-CM | POA: Diagnosis not present

## 2022-06-25 DIAGNOSIS — F411 Generalized anxiety disorder: Secondary | ICD-10-CM | POA: Diagnosis not present

## 2022-06-25 DIAGNOSIS — F01518 Vascular dementia, unspecified severity, with other behavioral disturbance: Secondary | ICD-10-CM

## 2022-06-25 NOTE — Progress Notes (Unsigned)
Location:   [facility] Room Number: 39-A Place of Service:  SNF [phone]) Provider:  Hazle Nordmann, NP  PCP: Mahlon Gammon, MD  Patient Care Team: Mahlon Gammon, MD as PCP - General (Internal Medicine)  Extended Emergency Contact Information Primary Emergency Contact: Harris,Margaret Address: [address]          [place], [place] [address] [place] of [place] Home Phone: [phone] Mobile Phone: [phone] Relation: Daughter  Code Status:  DNR Goals of care: Advanced Directive information    06/25/2022   12:01 PM  Advanced Directives  Does Patient Have a Medical Advance Directive? Yes  Type of Estate agent of [place];Living will;Out of facility DNR (pink MOST or yellow form)  Does patient want to make changes to medical advance directive? No - Patient declined  Copy of Healthcare Power of Attorney in Chart? Yes - validated most recent copy scanned in chart (See row information)     Chief Complaint  Patient presents with   Medical Management of Chronic Issues    Routine Visit.     HPI:  Pt is a 89 y.o. female seen today for medical management of chronic diseases.    She currently resides on the skilled nursing unit at [facility]. Past medical includes: HTN, constipation, T2DM, hypothyroidism, OA, urinary retention, vascular dementia, depression and anxiety.     T2DM- A1c 7.2 (03/8)> was 6.5 (12/22)> was  5.9 11/05/2021, blood sugars averaging 130-220's, no hypoglycemic events, on regular/NAS diet Weight gain- TSH 2.88 11/05/2021, see trends below Dementia- followed by palliative, BIMS 6/15 (02/27)> was 7/15 (11/24), CT head 04/2020 age related atrophy and chronic small vessel ischemia, increased anxiety in evening> xanax prescribed, poor safety awareness, remains on Depakote and Seroquel Depression/anxiety- see above, good family support, remains on Zoloft, Na+ 142 05/17/2022 HTN- BUN/creat 23/0.70 05/17/2022, remains on  metoprolol  HLD- LDL 93 05/17/2022, off statin Hypothyroidism- TSH 2.88 11/05/2021, remains on levothyroxine   Recent blood pressures:  04/26- 117/73  04/23- 155/76  04/16- 143/77  Recent weights:  04/01- 152.9 lbs  03/28- 153.5 lbs  02/01- 152.7 lbs    Past Medical History:  Diagnosis Date   Burst fracture of lumbar vertebra, sequela 12/27/2019   Closed left hip fracture (HCC) 04/07/2020   Diabetes mellitus type 2 in obese 11/09/2017   12/13/19 Hgb a1c 6.8, Na 141, K 4.3, Bun 16, creat 0.78, eGFR 67, wbc 9.6, Hgb 12.6, plt 318, neutrophils 60.9%, LDL 51  01/10/20 wbc 14.3, Hgb 13.6, plt 523, neutrophils 70.5%, Na 142, K 3.3, Bun 27, creat 0.62, eGFR 80  01/31/20 eye exam when the patient is able.   01/08/21 Hgb a1c 5.5   Fall as cause of accidental injury in residential institution as place of occurrence 04/07/2020   Hearing loss    History of fracture of clavicle    Hypokalemia 01/21/2020   01/21/20 Na 141, K 4.1, Bun 12, creat 0.65, eGFR 79, wbc 9.2, Hgb 12.7, plt 331   Hypothyroidism    Insomnia    Right lower lobe pneumonia 01/14/2020   Tachycardia 04/03/2019   Tinnitus    Unspecified fracture of the lower end of left radius, initial encounter for closed fracture 04/07/2020   Past Surgical History:  Procedure Laterality Date   CHOLECYSTECTOMY  2007   Dr. Alan Mulder   CLAVICLE EXCISION  1986   INTRAMEDULLARY (IM) NAIL INTERTROCHANTERIC Left 04/09/2020   Procedure: LEFT INTERTROCHANTERIC INTRAMEDULLARY (IM) NAIL;  Surgeon: Tarry Kos,  MD;  Location: [facility] OR;  Service: Orthopedics;  Laterality: Left;   MOHS SURGERY     past 10 years chest & legs   OPEN REDUCTION INTERNAL FIXATION (ORIF) DISTAL RADIAL FRACTURE Left 04/09/2020   Procedure: CLOSED REDUCTION LEFT DISTAL RADIUS FRACTURE;  Surgeon: Tarry Kos, MD;  Location: [facility] OR;  Service: Orthopedics;  Laterality: Left;    Allergies  Allergen Reactions   Metformin And Related Diarrhea    Allergies as of 06/25/2022        Reactions   Metformin And Related Diarrhea        Medication List        Accurate as of June 25, 2022 12:02 PM. If you have any questions, ask your nurse or doctor.          acetaminophen 325 MG tablet Commonly known as: TYLENOL Take 650 mg by mouth 3 (three) times daily as needed.   ALPRAZolam 0.5 MG tablet Commonly known as: XANAX Take 0.5 mg by mouth every 12 (twelve) hours as needed for anxiety.   CeraVe Baby 1 % Lotn Generic drug: DIMETHICONE (TOPICAL) Apply 1 Application topically daily. Apply to shoulders, back, and chest.   CeraVe Lotn Apply 1 Application topically as directed. Apply to back and chest topically every shift every 2 days for dry skin.   CeraVe Lotn Apply 1 Application topically daily. Apply to back and chest.   D3 50 MCG (2000 UT) Tabs Generic drug: Cholecalciferol Take by mouth.   divalproex 125 MG DR tablet Commonly known as: DEPAKOTE Take 125 mg by mouth daily.   docusate sodium 100 MG capsule Commonly known as: COLACE Take 100 mg by mouth daily.   Glucerna Liqd Take 240 mLs by mouth as needed.   hydrocortisone cream 1 % Apply 1 application  topically as needed. Apply to back, abdomen, and chest   ipratropium-albuterol 0.5-2.5 (3) MG/3ML Soln Commonly known as: DUONEB Take 3 mLs by nebulization every 6 (six) hours as needed.   levothyroxine 75 MCG tablet Commonly known as: SYNTHROID TAKE 1 TABLET (75 MCG TOTAL) BY MOUTH DAILY BEFORE BREAKFAST.   metoprolol tartrate 25 MG tablet Commonly known as: LOPRESSOR Take 12.5 mg by mouth 2 (two) times daily.   ondansetron 4 MG tablet Commonly known as: Zofran Take 1 tablet (4 mg total) by mouth 2 (two) times daily as needed for nausea or vomiting.   polyethylene glycol 17 g packet Commonly known as: MIRALAX / GLYCOLAX Take 17 g by mouth daily as needed for moderate constipation.   PreviDent 5000 Booster Plus 1.1 % Pste Generic drug: Sodium Fluoride Place 1 Application onto  teeth in the morning and at bedtime.   QUEtiapine 25 MG tablet Commonly known as: SEROQUEL Take 37.5 mg by mouth daily at 2 PM. Hold 09/13- 09/18 while on paxlovid   sertraline 50 MG tablet Commonly known as: ZOLOFT Take 50 mg by mouth daily.   zinc oxide 20 % ointment Apply 1 application topically as needed for irritation.        Review of Systems  Unable to perform ROS: Dementia    Immunization History  Administered Date(s) Administered   DTaP 07/20/2013   Fluad Quad(high Dose 65+) 12/23/2021   Influenza, High Dose Seasonal PF 12/07/2016   Influenza,inj,Quad PF,6+ Mos 12/01/2017   Influenza-Unspecified 11/20/2014, 12/12/2015, 11/20/2019   Moderna SARS-COV2 Booster Vaccination 08/06/2020   Moderna Sars-Covid-2 Vaccination 03/05/2019, 04/02/2019   PFIZER(Purple Top)SARS-COV-2 Vaccination 04/01/2020, 08/06/2020, 11/19/2020, 01/05/2022   PPD Test 08/06/2014  Pneumococcal Conjugate-13 11/07/2013   Pneumococcal Polysaccharide-23 03/22/2017   Tetanus 03/02/2015   Zoster Recombinat (Shingrix) 06/07/2017, 08/26/2017   Zoster, Live 03/01/2006   Pertinent  Health Maintenance Due  Topic Date Due   INFLUENZA VACCINE  09/30/2022   HEMOGLOBIN A1C  10/22/2022   OPHTHALMOLOGY EXAM  11/19/2022   FOOT EXAM  02/18/2023   DEXA SCAN  Completed      04/13/2020    8:00 PM 04/14/2020    7:47 AM 09/12/2020   12:07 PM 09/28/2021   10:30 AM 05/19/2022   12:28 PM  Fall Risk  Falls in the past year?   1 1 1   Was there an injury with Fall?   1 0 0  Fall Risk Category Calculator   3 2 1   Fall Risk Category (Retired)   High Moderate   (RETIRED) Patient Fall Risk Level High fall risk High fall risk  Moderate fall risk   Patient at Risk for Falls Due to   History of fall(s);Impaired balance/gait;Impaired mobility;Mental status change History of fall(s);Impaired balance/gait;Impaired mobility History of fall(s);Impaired balance/gait;Impaired mobility  Fall risk Follow up   Falls evaluation  completed;Education provided;Falls prevention discussed Falls evaluation completed;Education provided;Falls prevention discussed Falls evaluation completed;Education provided;Falls prevention discussed   Functional Status Survey:    Vitals:   06/25/22 1158  BP: (!) 155/76  Pulse: 85  Resp: 20  Temp: (!) 96 F (35.6 C)  SpO2: 98%  Weight: 153 lb 3.2 oz (69.5 kg)  Height: 5' (1.524 m)   Body mass index is 29.92 kg/m. Physical Exam Vitals reviewed.  Constitutional:      General: She is not in acute distress. HENT:     Head: Normocephalic.     Right Ear: There is no impacted cerumen.     Left Ear: There is no impacted cerumen.     Ears:     Comments: Hearing aids    Nose: Nose normal.     Mouth/Throat:     Mouth: Mucous membranes are moist.  Eyes:     General:        Right eye: No discharge.        Left eye: No discharge.  Cardiovascular:     Rate and Rhythm: Normal rate and regular rhythm.     Pulses: Normal pulses.     Heart sounds: Normal heart sounds.  Pulmonary:     Effort: Pulmonary effort is normal.     Breath sounds: Normal breath sounds.  Abdominal:     General: Bowel sounds are normal. There is no distension.     Palpations: Abdomen is soft.     Tenderness: There is no abdominal tenderness.  Musculoskeletal:     Cervical back: Neck supple.     Right lower leg: No edema.     Left lower leg: No edema.  Skin:    General: Skin is warm and dry.     Capillary Refill: Capillary refill takes less than 2 seconds.  Neurological:     General: No focal deficit present.     Mental Status: She is alert. Mental status is at baseline.     Motor: Weakness present.     Gait: Gait abnormal.     Comments: wheelchair  Psychiatric:        Mood and Affect: Mood normal.     Comments: Very pleasant, follows some commands     Labs reviewed: Recent Labs    11/05/21 0000 04/23/22 0000  NA 138 137  K  4.2 4.4  CL 101 101  CO2 31* 26*  BUN 23* 22*  CREATININE 0.7 0.7   CALCIUM 9.4 9.2   Recent Labs    11/05/21 0000 04/23/22 0000  AST 14 14  ALT 13 10  ALKPHOS 96 95  ALBUMIN 3.9 3.8   Recent Labs    11/05/21 0000 04/23/22 0000  WBC 8.5 10.3  NEUTROABS  --  6,489.00  HGB 13.0 13.4  HCT 40 41  PLT 257 280   Lab Results  Component Value Date   TSH 2.88 11/05/2021   Lab Results  Component Value Date   HGBA1C 7.0 04/23/2022   Lab Results  Component Value Date   CHOL 169 04/23/2022   HDL 38 04/23/2022   LDLCALC 95 04/23/2022   TRIG 236 (A) 04/23/2022   CHOLHDL 3.0 12/13/2019    Significant Diagnostic Results in last 30 days:  No results found.  Assessment/Plan 1. Diabetes mellitus type 2 in nonobese (HCC) - A1c stable, no hypoglycemias - diet controlled> metformin listed in allergies - cont diet low in carbs and sugars  2. Weight gain - BMI 29.92 - up 30 lbs from 1 year ago - she is on Depakote - advised to limit snacking - cont monthly weights  3. Vascular dementia with behavior disturbance (HCC) - increased anxiety in evening> on xanax prn- 30 day trial - dependent with ADLs except feeding - cont Depakote and Seroquel  4. Generalized anxiety disorder - see above - will increase Zoloft to 75 mg   5. Recurrent depression (HCC) - Na+ stable - see above - cont Zoloft  6. Primary hypertension - controlled with metoprolol  7. Mixed hyperlipidemia - LDL stable  - not on statin  8. Acquired hypothyroidism - TSH stable - weight gain - cont levothyroxine - TSH 05/02> pharmacy request  9. Aortic atherosclerosis (HCC) - noted CT abdomen 11/2019    Family/ staff Communication: plan discussed with patient and nurse  Labs/tests ordered:  TSH 05/02

## 2022-07-01 DIAGNOSIS — E039 Hypothyroidism, unspecified: Secondary | ICD-10-CM | POA: Diagnosis not present

## 2022-07-02 LAB — TSH: TSH: 2.18 (ref 0.41–5.90)

## 2022-07-06 DIAGNOSIS — L84 Corns and callosities: Secondary | ICD-10-CM | POA: Diagnosis not present

## 2022-07-06 DIAGNOSIS — L602 Onychogryphosis: Secondary | ICD-10-CM | POA: Diagnosis not present

## 2022-07-06 DIAGNOSIS — E1159 Type 2 diabetes mellitus with other circulatory complications: Secondary | ICD-10-CM | POA: Diagnosis not present

## 2022-07-09 DIAGNOSIS — L84 Corns and callosities: Secondary | ICD-10-CM | POA: Diagnosis not present

## 2022-07-09 DIAGNOSIS — L602 Onychogryphosis: Secondary | ICD-10-CM | POA: Diagnosis not present

## 2022-07-09 DIAGNOSIS — E1159 Type 2 diabetes mellitus with other circulatory complications: Secondary | ICD-10-CM | POA: Diagnosis not present

## 2022-07-12 DIAGNOSIS — Z79899 Other long term (current) drug therapy: Secondary | ICD-10-CM | POA: Diagnosis not present

## 2022-07-15 ENCOUNTER — Non-Acute Institutional Stay (SKILLED_NURSING_FACILITY): Payer: Medicare Other | Admitting: Orthopedic Surgery

## 2022-07-15 ENCOUNTER — Encounter: Payer: Self-pay | Admitting: Orthopedic Surgery

## 2022-07-15 DIAGNOSIS — B372 Candidiasis of skin and nail: Secondary | ICD-10-CM

## 2022-07-15 MED ORDER — NYSTATIN 100000 UNIT/GM EX POWD
1.0000 | Freq: Two times a day (BID) | CUTANEOUS | 0 refills | Status: AC
Start: 2022-07-15 — End: 2022-07-25

## 2022-07-15 MED ORDER — FLUCONAZOLE 150 MG PO TABS
150.0000 mg | ORAL_TABLET | Freq: Once | ORAL | 0 refills | Status: AC
Start: 2022-07-15 — End: 2022-07-15

## 2022-07-15 NOTE — Progress Notes (Signed)
Location:   [facility]  Nursing Home Room Number: 39-A Place of Service:  [phone]) Provider:  Hazle Nordmann, NP  PCP: Mahlon Gammon, MD  Patient Care Team: Mahlon Gammon, MD as PCP - General (Internal Medicine)  Extended Emergency Contact Information Primary Emergency Contact: Harris,Margaret Address: [address]          [place], [place] [address] [place] Home Phone: [phone] Mobile Phone: [phone] Relation: Daughter  Code Status:  DNR Goals of care: Advanced Directive information    07/15/2022    9:41 AM  Advanced Directives  Does Patient Have a Medical Advance Directive? Yes  Type of Estate agent of [place];Living will;Out of facility DNR (pink MOST or yellow form)  Does patient want to make changes to medical advance directive? No - Patient declined  Copy of Healthcare Power of Attorney in Chart? Yes - validated most recent copy scanned in chart (See row information)     Chief Complaint  Patient presents with   Acute Visit    Rash    HPI:  Pt is a 89 y.o. female seen today for an acute visit due to rash.   She currently resides on the skilled nursing unit at [facility]. Past medical includes: HTN, constipation, T2DM, hypothyroidism, OA, urinary retention, vascular dementia, depression and anxiety.     Nursing reports increased redness to peri area during routine care. She is a poor historian due to dementia. She denies pain, admits to some itching. Afebrile. Vitals stable.    Past Medical History:  Diagnosis Date   Burst fracture of lumbar vertebra, sequela 12/27/2019   Closed left hip fracture (HCC) 04/07/2020   Diabetes mellitus type 2 in obese 11/09/2017   12/13/19 Hgb a1c 6.8, Na 141, K 4.3, Bun 16, creat 0.78, eGFR 67, wbc 9.6, Hgb 12.6, plt 318, neutrophils 60.9%, LDL 51  01/10/20 wbc 14.3, Hgb 13.6, plt 523, neutrophils 70.5%, Na 142, K 3.3, Bun 27, creat 0.62, eGFR 80  01/31/20 eye exam when the  patient is able.   01/08/21 Hgb a1c 5.5   Fall as cause of accidental injury in residential institution as place of occurrence 04/07/2020   Hearing loss    History of fracture of clavicle    Hypokalemia 01/21/2020   01/21/20 Na 141, K 4.1, Bun 12, creat 0.65, eGFR 79, wbc 9.2, Hgb 12.7, plt 331   Hypothyroidism    Insomnia    Right lower lobe pneumonia 01/14/2020   Tachycardia 04/03/2019   Tinnitus    Unspecified fracture of the lower end of left radius, initial encounter for closed fracture 04/07/2020   Past Surgical History:  Procedure Laterality Date   CHOLECYSTECTOMY  2007   Dr. Alan Mulder   CLAVICLE EXCISION  1986   INTRAMEDULLARY (IM) NAIL INTERTROCHANTERIC Left 04/09/2020   Procedure: LEFT INTERTROCHANTERIC INTRAMEDULLARY (IM) NAIL;  Surgeon: Tarry Kos, MD;  Location: [facility] OR;  Service: Orthopedics;  Laterality: Left;   MOHS SURGERY     past 10 years chest & legs   OPEN REDUCTION INTERNAL FIXATION (ORIF) DISTAL RADIAL FRACTURE Left 04/09/2020   Procedure: CLOSED REDUCTION LEFT DISTAL RADIUS FRACTURE;  Surgeon: Tarry Kos, MD;  Location: [facility] OR;  Service: Orthopedics;  Laterality: Left;    Allergies  Allergen Reactions   Metformin And Related Diarrhea    Allergies as of 07/15/2022       Reactions   Metformin And Related Diarrhea  Medication List        Accurate as of Jul 15, 2022  9:41 AM. If you have any questions, ask your nurse or doctor.          STOP taking these medications    ALPRAZolam 0.5 MG tablet Commonly known as: XANAX Stopped by: Octavia Heir, NP       TAKE these medications    acetaminophen 325 MG tablet Commonly known as: TYLENOL Take 650 mg by mouth 3 (three) times daily as needed.   CeraVe Baby 1 % Lotn Generic drug: DIMETHICONE (TOPICAL) Apply 1 Application topically daily. Apply to shoulders, back, and chest.   CeraVe Lotn Apply 1 Application topically as directed. Apply to back and chest topically every shift every 2 days  for dry skin.   CeraVe Lotn Apply 1 Application topically daily. Apply to back and chest.   D3 50 MCG (2000 UT) Tabs Generic drug: Cholecalciferol Take by mouth.   divalproex 125 MG DR tablet Commonly known as: DEPAKOTE Take 125 mg by mouth daily.   docusate sodium 100 MG capsule Commonly known as: COLACE Take 100 mg by mouth daily.   Glucerna Liqd Take 240 mLs by mouth as needed.   hydrocortisone cream 1 % Apply 1 application  topically as needed. Apply to back, abdomen, and chest   ipratropium-albuterol 0.5-2.5 (3) MG/3ML Soln Commonly known as: DUONEB Take 3 mLs by nebulization every 6 (six) hours as needed.   levothyroxine 75 MCG tablet Commonly known as: SYNTHROID TAKE 1 TABLET (75 MCG TOTAL) BY MOUTH DAILY BEFORE BREAKFAST.   metoprolol tartrate 25 MG tablet Commonly known as: LOPRESSOR Take 12.5 mg by mouth 2 (two) times daily.   ondansetron 4 MG tablet Commonly known as: Zofran Take 1 tablet (4 mg total) by mouth 2 (two) times daily as needed for nausea or vomiting.   polyethylene glycol 17 g packet Commonly known as: MIRALAX / GLYCOLAX Take 17 g by mouth daily as needed for moderate constipation.   PreviDent 5000 Booster Plus 1.1 % Pste Generic drug: Sodium Fluoride Place 1 Application onto teeth in the morning and at bedtime.   QUEtiapine 25 MG tablet Commonly known as: SEROQUEL Take 37.5 mg by mouth daily at 2 PM. Hold 09/13- 09/18 while on paxlovid   sertraline 50 MG tablet Commonly known as: ZOLOFT Take 75 mg by mouth daily.   zinc oxide 20 % ointment Apply 1 application topically as needed for irritation.        Review of Systems  Unable to perform ROS: Dementia    Immunization History  Administered Date(s) Administered   DTaP 07/20/2013   Fluad Quad(high Dose 65+) 12/23/2021   Influenza, High Dose Seasonal PF 12/07/2016   Influenza,inj,Quad PF,6+ Mos 12/01/2017   Influenza-Unspecified 11/20/2014, 12/12/2015, 11/20/2019   Moderna  SARS-COV2 Booster Vaccination 08/06/2020   Moderna Sars-Covid-2 Vaccination 03/05/2019, 04/02/2019   PFIZER(Purple Top)SARS-COV-2 Vaccination 04/01/2020, 08/06/2020, 11/19/2020, 01/05/2022   PPD Test 08/06/2014   Pneumococcal Conjugate-13 11/07/2013   Pneumococcal Polysaccharide-23 03/22/2017   Tetanus 03/02/2015   Zoster Recombinat (Shingrix) 06/07/2017, 08/26/2017   Zoster, Live 03/01/2006   Pertinent  Health Maintenance Due  Topic Date Due   INFLUENZA VACCINE  09/30/2022   HEMOGLOBIN A1C  11/18/2022   OPHTHALMOLOGY EXAM  11/19/2022   FOOT EXAM  02/18/2023   DEXA SCAN  Completed      04/13/2020    8:00 PM 04/14/2020    7:47 AM 09/12/2020   12:07 PM 09/28/2021  10:30 AM 05/19/2022   12:28 PM  Fall Risk  Falls in the past year?   1 1 1   Was there an injury with Fall?   1 0 0  Fall Risk Category Calculator   3 2 1   Fall Risk Category (Retired)   High Moderate   (RETIRED) Patient Fall Risk Level High fall risk High fall risk  Moderate fall risk   Patient at Risk for Falls Due to   History of fall(s);Impaired balance/gait;Impaired mobility;Mental status change History of fall(s);Impaired balance/gait;Impaired mobility History of fall(s);Impaired balance/gait;Impaired mobility  Fall risk Follow up   Falls evaluation completed;Education provided;Falls prevention discussed Falls evaluation completed;Education provided;Falls prevention discussed Falls evaluation completed;Education provided;Falls prevention discussed   Functional Status Survey:    Vitals:   07/15/22 0930  BP: 110/62  Pulse: 76  Resp: 18  Temp: (!) 96.7 F (35.9 C)  SpO2: 97%  Weight: 153 lb 8 oz (69.6 kg)  Height: 5' (1.524 m)   Body mass index is 29.98 kg/m. Physical Exam Vitals reviewed.  HENT:     Head: Normocephalic.  Eyes:     General:        Right eye: No discharge.        Left eye: No discharge.  Cardiovascular:     Rate and Rhythm: Normal rate and regular rhythm.     Pulses: Normal pulses.      Heart sounds: Normal heart sounds.  Pulmonary:     Effort: Pulmonary effort is normal. No respiratory distress.     Breath sounds: Normal breath sounds. No wheezing.  Abdominal:     Palpations: Abdomen is soft.  Genitourinary:    Labia:        Right: Rash present.      Comments: Increased redness and mild swelling to labia, trace amount of white discharge Musculoskeletal:     Right lower leg: No edema.     Left lower leg: No edema.  Skin:    General: Skin is warm.     Capillary Refill: Capillary refill takes less than 2 seconds.     Findings: Rash present.     Comments: Groin folds excoriated  Neurological:     General: No focal deficit present.     Mental Status: She is alert. Mental status is at baseline.     Motor: Weakness present.     Gait: Gait abnormal.  Psychiatric:        Mood and Affect: Mood normal.     Labs reviewed: Recent Labs    11/05/21 0000 04/23/22 0000 05/18/22 0000  NA 138 137 142  K 4.2 4.4 4.4  CL 101 101 105  CO2 31* 26* 28*  BUN 23* 22* 23*  CREATININE 0.7 0.7 0.7  CALCIUM 9.4 9.2 8.7   Recent Labs    11/05/21 0000 04/23/22 0000 05/18/22 0000  AST 14 14 11*  ALT 13 10 13   ALKPHOS 96 95 100  ALBUMIN 3.9 3.8 3.4*   Recent Labs    11/05/21 0000 04/23/22 0000 05/18/22 0000  WBC 8.5 10.3 9.8  NEUTROABS  --  6,489.00  --   HGB 13.0 13.4 12.4  HCT 40 41 38  PLT 257 280 259   Lab Results  Component Value Date   TSH 2.18 07/02/2022   Lab Results  Component Value Date   HGBA1C 7.2 05/18/2022   Lab Results  Component Value Date   CHOL 158 05/18/2022   HDL 37 05/18/2022   LDLCALC 93  05/18/2022   TRIG 187 (A) 05/18/2022   CHOLHDL 3.0 12/13/2019    Significant Diagnostic Results in last 30 days:  No results found.  Assessment/Plan 1. Candidal skin infection - excoriated groin folds, trace white discharge - h/o T2DM and incontinence - start diflucan and nystatin treatments - fluconazole (DIFLUCAN) 150 MG tablet; Take 1  tablet (150 mg total) by mouth once for 1 dose.  Dispense: 1 tablet; Refill: 0 - nystatin (MYCOSTATIN/NYSTOP) powder; Apply 1 Application topically 2 (two) times daily for 10 days.  Dispense: 20 g; Refill: 0    Family/ staff Communication: plan discussed with patient and nurse  Labs/tests ordered:  none

## 2022-07-23 ENCOUNTER — Telehealth: Payer: Self-pay | Admitting: Family Medicine

## 2022-07-23 NOTE — Telephone Encounter (Signed)
Patient's daughter had asked for updates on recent Palliative visits with patient.  Left vm with contact number for call back.  Joycelyn Man FNP-C

## 2022-07-28 ENCOUNTER — Non-Acute Institutional Stay: Payer: Medicare Other | Admitting: Family Medicine

## 2022-07-28 ENCOUNTER — Encounter: Payer: Self-pay | Admitting: Family Medicine

## 2022-07-28 VITALS — BP 136/50 | HR 89 | Temp 97.2°F | Resp 16

## 2022-07-28 DIAGNOSIS — Z515 Encounter for palliative care: Secondary | ICD-10-CM

## 2022-07-28 DIAGNOSIS — F03918 Unspecified dementia, unspecified severity, with other behavioral disturbance: Secondary | ICD-10-CM

## 2022-07-28 NOTE — Progress Notes (Signed)
Therapist, nutritional Palliative Care Consult Note Telephone: [phone]  Fax: [phone]   Date of encounter: 07/28/22 9:55 AM PATIENT NAME: Lisa Castillo [address] [place] [address]   [phone] (home)  DOB: 01-12-1929 MRN: [id number] PRIMARY CARE PROVIDER:    Mahlon Gammon, MD,  [address] [place] [address] [phone]  REFERRING PROVIDER:   Mahlon Gammon, MD [address] [place],  [place] [address] [phone]  Health Care Agent/Health Care Power of Attorney:    Contact Information     Name Relation Home Work [place] Daughter [phone]  [phone]        I met face to face with patient in [facility].  Palliative Care was asked to follow this patient by consultation request of Mahlon Gammon, MD to address This is a follow up visit.   HEALTH CARE POA-Margaret "Mauri" Harris    CODE STATUS:  DNR   ASSESSMENT AND / RECOMMENDATIONS:  PPS:50%  Dementia with behavioral disturbance-  Not currently on specific meds for Dementia Agree with and continues on HS Depakote 125 mg QHS.  Has Seroquel 37.5 mg at 2 pm to help with sundowning symptoms PRN Ativan and Sertraline 75 mg daily for depression and anxiety. FAST 7 Score 6e Having some delusions  2.  Palliative Care Encounter Discussed normal progression of dementia with infections, difficulty swallowing and how these will impact the course of the disease. Allowed for therapeutic listening and support of family       Follow up Palliative Care Visit:  Palliative Care continuing to follow up by monitoring for changes in appetite, weight, functional and cognitive status for chronic disease progression and management in agreement with patient's stated goals of care. Next visit in 4 weeks or prn.  This visit was coded based on medical decision making (MDM).  Chief Complaint  Palliative Care is continuing to follow  patient for chronic medical management in setting of dementia and to assist with refining and defining goals of care.   HISTORY OF PRESENT ILLNESS: Lisa Castillo is a 89 y.o. year old female with dementia, depression with anxiety, HTN, left tubo-ovarian mass, frequent falls, hx of rectal bleed with anemia, HLD, gait abnormality, OA and osteopenia, DM and hypothyroidism.  She denies pain, SOB, nausea or dysuria. Daughter Orlinda Blalock indicates that pt has been talking more about her mother.  Pt's weight has been stable. Denies any blood in stools. Primarily uses her wheelchair mobility and has poor safety awareness of limitations per staff.   ACTIVITIES OF DAILY LIVING: CONTINENT OF BLADDER? Intermittent CONTINENT OF BOWEL? Intermittent Bathing/Dressing : Dependent  MOBILITY:   WHEELCHAIR Dependent    APPETITE? Consumes majority of meals without difficulty - BOOST Supplement QHS  CURRENT PROBLEM LIST:  Patient Active Problem List   Diagnosis Date Noted   Osteoarthritis, multiple sites 10/20/2020   Urinary retention 04/15/2020   Anemia, blood loss 04/15/2020   Diabetes mellitus type 2 in nonobese (HCC) 04/07/2020   Dementia with behavioral disturbance (HCC) 04/07/2020   Frequent falls 02/25/2020   HTN (hypertension) 02/04/2020   Rectal bleed 01/18/2020   Depression with anxiety 12/31/2019   Left tubo-ovarian mass 12/31/2019   Slow transit constipation 12/31/2019   Osteopenia 06/14/2018   Obesity (BMI 30-39.9) 01/19/2017   Mixed hyperlipidemia 06/29/2016   Gait abnormality 04/27/2016   Hearing loss    Hypothyroidism    Insomnia    PAST MEDICAL HISTORY:  Active Ambulatory  Problems    Diagnosis Date Noted   Hearing loss    Hypothyroidism    Insomnia    Gait abnormality 04/27/2016   Mixed hyperlipidemia 06/29/2016   Obesity (BMI 30-39.9) 01/19/2017   Osteopenia 06/14/2018   Depression with anxiety 12/31/2019   Left tubo-ovarian mass 12/31/2019   Slow transit constipation  12/31/2019   Rectal bleed 01/18/2020   HTN (hypertension) 02/04/2020   Frequent falls 02/25/2020   Diabetes mellitus type 2 in nonobese (HCC) 04/07/2020   Dementia with behavioral disturbance (HCC) 04/07/2020   Urinary retention 04/15/2020   Anemia, blood loss 04/15/2020   Osteoarthritis, multiple sites 10/20/2020   Resolved Ambulatory Problems    Diagnosis Date Noted   Hyperglycemia 06/29/2016   Prediabetes 01/19/2017   Diabetes mellitus type 2 in obese 11/09/2017   Malaise and fatigue 04/03/2019   Senile dementia, delirium (HCC) 04/03/2019   Tachycardia 04/03/2019   Burst fracture of lumbar vertebra, sequela 12/27/2019   Right lower lobe pneumonia 01/14/2020   Hypokalemia 01/21/2020   Closed left hip fracture, initial encounter (HCC) 04/07/2020   Unspecified fracture of the lower end of left radius, initial encounter for closed fracture 04/07/2020   Fall as cause of accidental injury in residential institution as place of occurrence 04/07/2020   Closed left hip fracture (HCC) 04/07/2020   Past Medical History:  Diagnosis Date   History of fracture of clavicle    Tinnitus    SOCIAL HX:  Social History   Tobacco Use   Smoking status: Never   Smokeless tobacco: Never  Substance Use Topics   Alcohol use: Yes    Comment: 4 per week   FAMILY HX:  Family History  Problem Relation Age of Onset   Heart disease Mother    Heart disease Father        Preferred Pharmacy: ALLERGIES:  Allergies  Allergen Reactions   Metformin And Related Diarrhea     PERTINENT MEDICATIONS:  Outpatient Encounter Medications as of 07/28/2022  Medication Sig   acetaminophen (TYLENOL) 325 MG tablet Take 650 mg by mouth 3 (three) times daily as needed.   Cholecalciferol (D3) 50 MCG (2000 UT) TABS Take by mouth.   DIMETHICONE, TOPICAL, (CERAVE BABY) 1 % LOTN Apply 1 Application topically daily. Apply to shoulders, back, and chest.   divalproex (DEPAKOTE) 125 MG DR tablet Take 125 mg by mouth  daily.   docusate sodium (COLACE) 100 MG capsule Take 100 mg by mouth daily.   Emollient (CERAVE) LOTN Apply 1 Application topically as directed. Apply to back and chest topically every shift every 2 days for dry skin.   Emollient (CERAVE) LOTN Apply 1 Application topically daily. Apply to back and chest.   Glucerna (GLUCERNA) LIQD Take 240 mLs by mouth as needed.   hydrocortisone cream 1 % Apply 1 application  topically as needed. Apply to back, abdomen, and chest   ipratropium-albuterol (DUONEB) 0.5-2.5 (3) MG/3ML SOLN Take 3 mLs by nebulization every 6 (six) hours as needed.   levothyroxine (SYNTHROID) 75 MCG tablet TAKE 1 TABLET (75 MCG TOTAL) BY MOUTH DAILY BEFORE BREAKFAST.   metoprolol tartrate (LOPRESSOR) 25 MG tablet Take 12.5 mg by mouth 2 (two) times daily.   ondansetron (ZOFRAN) 4 MG tablet Take 1 tablet (4 mg total) by mouth 2 (two) times daily as needed for nausea or vomiting.   polyethylene glycol (MIRALAX / GLYCOLAX) 17 g packet Take 17 g by mouth daily as needed for moderate constipation.   QUEtiapine (SEROQUEL) 25 MG tablet  Take 37.5 mg by mouth daily at 2 PM. Hold 09/13- 09/18 while on paxlovid   sertraline (ZOLOFT) 50 MG tablet Take 75 mg by mouth daily.   Sodium Fluoride (PREVIDENT 5000 BOOSTER PLUS) 1.1 % PSTE Place 1 Application onto teeth in the morning and at bedtime.   zinc oxide 20 % ointment Apply 1 application topically as needed for irritation.   No facility-administered encounter medications on file as of 07/28/2022.    History obtained from review of EMR, interview with daughter, facility staff/caregiver and patient.      04/23/22 HGB A1c 7.0% (02/24/22 6.5%, 11/05/21 5.9%)  I reviewed available labs, medications, imaging, studies and related documents from the EMR.  Records summarized above.   Physical Exam:  Current Weight: 153 lbs 8 oz on 07/15/22, 153.5 on 04/01/22   GENERAL: NAD LUNGS: CTAB, no increased work of breathing, room air CARDIAC:  S1S2, RRR  with no MRG, No edema/cyanosis ABD:  Normo-active BS x 4 quads, soft, non-tender, no ascites Skin: Warm/Dry/Intact without lesion or wounds. No bruising  EXTREMITIES: Limited ROM, moves all extremities, no deformity, No muscle atrophy/subcutaneous fat loss NEURO:  No weakness, mild cognitive impairment, forgetful with long term recall intact  PSYCH:  flat, non-anxious affect, A & O x 2   Thank you for the opportunity to participate in the care of Jerline Pain. Please call our main office at [phone] if we can be of additional assistance.    Joycelyn Man FNP-C  [email or web address] .[facility]  Phone:  [phone]

## 2022-08-05 ENCOUNTER — Non-Acute Institutional Stay (SKILLED_NURSING_FACILITY): Payer: Medicare Other | Admitting: Internal Medicine

## 2022-08-05 DIAGNOSIS — F339 Major depressive disorder, recurrent, unspecified: Secondary | ICD-10-CM

## 2022-08-05 DIAGNOSIS — E119 Type 2 diabetes mellitus without complications: Secondary | ICD-10-CM | POA: Diagnosis not present

## 2022-08-05 DIAGNOSIS — R000 Tachycardia, unspecified: Secondary | ICD-10-CM | POA: Diagnosis not present

## 2022-08-05 DIAGNOSIS — E039 Hypothyroidism, unspecified: Secondary | ICD-10-CM

## 2022-08-05 DIAGNOSIS — F01518 Vascular dementia, unspecified severity, with other behavioral disturbance: Secondary | ICD-10-CM | POA: Diagnosis not present

## 2022-08-08 ENCOUNTER — Encounter: Payer: Self-pay | Admitting: Internal Medicine

## 2022-08-08 NOTE — Progress Notes (Signed)
[facility]  [facility]  S[facility] (31)  Provider:   Code Status: DNR Goals of Care:     07/15/2022    9:41 AM  Advanced Directives  Does Patient Have a Medical Advance Directive? Yes  Type of Estate agent of [place];Living will;Out of facility DNR (pink MOST or yellow form)  Does patient want to make changes to medical advance directive? No - Patient declined  Copy of Healthcare Power of Attorney in Chart? Yes - validated most recent copy scanned in chart (See row information)     Chief Complaint  Patient presents with   Chronic Care Management    HPI: [name] a 89 y.o. female seen today for medical management of chronic diseases.    [name] Resident of SNF    Patient has a history of type 2 diabetes, hyperlipidemia, depression, dementia, hypothyroidism, osteopenia, stress and urge urinary incontinence, Tachycardia  h/o Hip fracture and Compression fracture after falls     She is stable. No new Nursing issues.  Behaviors managed with Seroquel and Depakote Stays in her wheelchair mostly now Sometimes tries to leave the unit but mostly pleasantly confused Her weight is stable Does have aphasia No Falls Wt Readings from Last 3 Encounters:  08/05/22 153 lb 8 oz (69.6 kg)  07/15/22 153 lb 8 oz (69.6 kg)  06/25/22 153 lb 3.2 oz (69.5 kg)     Past Medical History:  Diagnosis Date   Burst fracture of lumbar vertebra, sequela 12/27/2019   Closed left hip fracture (HCC) 04/07/2020   Diabetes mellitus type 2 in obese 11/09/2017   12/13/19 Hgb a1c 6.8, Na 141, K 4.3, Bun 16, creat 0.78, eGFR 67, wbc 9.6, Hgb 12.6, plt 318, neutrophils 60.9%, LDL 51  01/10/20 wbc 14.3, Hgb 13.6, plt 523, neutrophils 70.5%, Na 142, K 3.3, Bun 27, creat 0.62, eGFR 80  01/31/20 eye exam when the patient is able.   01/08/21 Hgb a1c 5.5   Fall as cause of accidental injury in residential institution as place of occurrence 04/07/2020   Hearing loss     History of fracture of clavicle    Hypokalemia 01/21/2020   01/21/20 Na 141, K 4.1, Bun 12, creat 0.65, eGFR 79, wbc 9.2, Hgb 12.7, plt 331   Hypothyroidism    Insomnia    Right lower lobe pneumonia 01/14/2020   Tachycardia 04/03/2019   Tinnitus    Unspecified fracture of the lower end of left radius, initial encounter for closed fracture 04/07/2020    Past Surgical History:  Procedure Laterality Date   CHOLECYSTECTOMY  2007   Dr. Alan Mulder   CLAVICLE EXCISION  1986   INTRAMEDULLARY (IM) NAIL INTERTROCHANTERIC Left 04/09/2020   Procedure: LEFT INTERTROCHANTERIC INTRAMEDULLARY (IM) NAIL;  Surgeon: Tarry Kos, MD;  Location: [facility] OR;  Service: Orthopedics;  Laterality: Left;   MOHS SURGERY     past 10 years chest & legs   OPEN REDUCTION INTERNAL FIXATION (ORIF) DISTAL RADIAL FRACTURE Left 04/09/2020   Procedure: CLOSED REDUCTION LEFT DISTAL RADIUS FRACTURE;  Surgeon: Tarry Kos, MD;  Location: [facility] OR;  Service: Orthopedics;  Laterality: Left;    Allergies  Allergen Reactions   Metformin And Related Diarrhea    Outpatient Encounter Medications as of 08/05/2022  Medication Sig   acetaminophen (TYLENOL) 325 MG tablet Take 650 mg by mouth 3 (three) times daily as needed.   Cholecalciferol (D3) 50 MCG (2000 UT) TABS Take by mouth.  DIMETHICONE, TOPICAL, (CERAVE BABY) 1 % LOTN Apply 1 Application topically daily. Apply to shoulders, back, and chest.   divalproex (DEPAKOTE) 125 MG DR tablet Take 125 mg by mouth daily.   docusate sodium (COLACE) 100 MG capsule Take 100 mg by mouth daily.   Emollient (CERAVE) LOTN Apply 1 Application topically as directed. Apply to back and chest topically every shift every 2 days for dry skin.   Emollient (CERAVE) LOTN Apply 1 Application topically daily. Apply to back and chest.   Glucerna (GLUCERNA) LIQD Take 240 mLs by mouth as needed.   hydrocortisone cream 1 % Apply 1 application  topically as needed. Apply to back, abdomen, and chest    ipratropium-albuterol (DUONEB) 0.5-2.5 (3) MG/3ML SOLN Take 3 mLs by nebulization every 6 (six) hours as needed.   levothyroxine (SYNTHROID) 75 MCG tablet TAKE 1 TABLET (75 MCG TOTAL) BY MOUTH DAILY BEFORE BREAKFAST.   metoprolol tartrate (LOPRESSOR) 25 MG tablet Take 12.5 mg by mouth 2 (two) times daily.   ondansetron (ZOFRAN) 4 MG tablet Take 1 tablet (4 mg total) by mouth 2 (two) times daily as needed for nausea or vomiting.   polyethylene glycol (MIRALAX / GLYCOLAX) 17 g packet Take 17 g by mouth daily as needed for moderate constipation.   QUEtiapine (SEROQUEL) 25 MG tablet Take 37.5 mg by mouth daily at 2 PM. Hold 09/13- 09/18 while on paxlovid   sertraline (ZOLOFT) 50 MG tablet Take 75 mg by mouth daily.   Sodium Fluoride (PREVIDENT 5000 BOOSTER PLUS) 1.1 % PSTE Place 1 Application onto teeth in the morning and at bedtime.   zinc oxide 20 % ointment Apply 1 application topically as needed for irritation.   No facility-administered encounter medications on file as of 08/05/2022.    Review of Systems:  Review of Systems  Unable to perform ROS: Dementia    Health Maintenance  Topic Date Due   Medicare Annual Wellness (AWV)  09/29/2022   COVID-19 Vaccine (8 - 2023-24 season) 10/29/2022 (Originally 03/02/2022)   INFLUENZA VACCINE  09/30/2022   HEMOGLOBIN A1C  11/18/2022   OPHTHALMOLOGY EXAM  11/19/2022   FOOT EXAM  02/18/2023   DTaP/Tdap/Td (3 - Tdap) 03/01/2025   Pneumonia Vaccine 67+ Years old  Completed   DEXA SCAN  Completed   Zoster Vaccines- Shingrix  Completed   HPV VACCINES  Aged Out    Physical Exam: Vitals:   08/05/22 1228  BP: 136/66  Pulse: 94  Resp: 18  Temp: (!) 97 F (36.1 C)  Weight: 153 lb 8 oz (69.6 kg)   Body mass index is 29.98 kg/m. Physical Exam Vitals reviewed.  Constitutional:      Appearance: Normal appearance.  HENT:     Head: Normocephalic.     Nose: Nose normal.     Mouth/Throat:     Mouth: Mucous membranes are moist.     Pharynx:  Oropharynx is clear.  Eyes:     Pupils: Pupils are equal, round, and reactive to light.  Cardiovascular:     Rate and Rhythm: Normal rate and regular rhythm.     Pulses: Normal pulses.     Heart sounds: Normal heart sounds. No murmur heard. Pulmonary:     Effort: Pulmonary effort is normal.     Breath sounds: Normal breath sounds.  Abdominal:     General: Abdomen is flat. Bowel sounds are normal.     Palpations: Abdomen is soft.  Musculoskeletal:     Cervical back: Neck supple.  Comments: Mild Swelling in her legs  Skin:    General: Skin is warm.  Neurological:     General: No focal deficit present.     Mental Status: She is alert.  Psychiatric:        Mood and Affect: Mood normal.        Thought Content: Thought content normal.     Labs reviewed: Basic Metabolic Panel: Recent Labs    11/05/21 0000 04/23/22 0000 05/18/22 0000 07/02/22 0000  NA 138 137 142  --   K 4.2 4.4 4.4  --   CL 101 101 105  --   CO2 31* 26* 28*  --   BUN 23* 22* 23*  --   CREATININE 0.7 0.7 0.7  --   CALCIUM 9.4 9.2 8.7  --   TSH 2.88  --   --  2.18   Liver Function Tests: Recent Labs    11/05/21 0000 04/23/22 0000 05/18/22 0000  AST 14 14 11*  ALT 13 10 13   ALKPHOS 96 95 100  ALBUMIN 3.9 3.8 3.4*   No results for input(s): "LIPASE", "AMYLASE" in the last 8760 hours. No results for input(s): "AMMONIA" in the last 8760 hours. CBC: Recent Labs    11/05/21 0000 04/23/22 0000 05/18/22 0000  WBC 8.5 10.3 9.8  NEUTROABS  --  6,489.00  --   HGB 13.0 13.4 12.4  HCT 40 41 38  PLT 257 280 259   Lipid Panel: Recent Labs    04/23/22 0000 05/18/22 0000  CHOL 169 158  HDL 38 37  LDLCALC 95 93  TRIG 236* 187*   Lab Results  Component Value Date   HGBA1C 7.2 05/18/2022    Procedures since last visit: No results found.  Assessment/Plan 1. Diabetes mellitus type 2 in nonobese (HCC) A1C 7.2 Continue to monitor CBGS 140's  2. Vascular dementia with behavior disturbance  (HCC) SNF level care Behaviors manageable  3. Recurrent depression (HCC) Doing well on ZoLoft  4. Acquired hypothyroidism TSH normal in 05/24  5. Tachycardia Low Dose of Lopressor    Labs/tests ordered:  * No order type specified * Next appt:  Visit date not found

## 2022-08-09 DIAGNOSIS — Z515 Encounter for palliative care: Secondary | ICD-10-CM | POA: Insufficient documentation

## 2022-08-17 ENCOUNTER — Encounter: Payer: Self-pay | Admitting: Adult Health

## 2022-08-17 ENCOUNTER — Non-Acute Institutional Stay (SKILLED_NURSING_FACILITY): Payer: Medicare Other | Admitting: Adult Health

## 2022-08-17 DIAGNOSIS — I10 Essential (primary) hypertension: Secondary | ICD-10-CM | POA: Diagnosis not present

## 2022-08-17 DIAGNOSIS — H1032 Unspecified acute conjunctivitis, left eye: Secondary | ICD-10-CM

## 2022-08-17 DIAGNOSIS — F01518 Vascular dementia, unspecified severity, with other behavioral disturbance: Secondary | ICD-10-CM | POA: Diagnosis not present

## 2022-08-17 MED ORDER — ERYTHROMYCIN 5 MG/GM OP OINT
1.0000 | TOPICAL_OINTMENT | Freq: Four times a day (QID) | OPHTHALMIC | 0 refills | Status: AC
Start: 2022-08-17 — End: 2022-08-22

## 2022-08-17 NOTE — Progress Notes (Signed)
Location:  [facility] Room Number: 39-A Place of Service:  SNF (31) Provider:  Kenard Gower, DNP, FNP-BC  Patient Care Team: Mahlon Gammon, MD as PCP - General (Internal Medicine)  Extended Emergency Contact Information Primary Emergency Contact: Harris,Margaret Address: [address]          [place], [place] [address] [place] of [place] Home Phone: [phone] Mobile Phone: [phone] Relation: Daughter  Code Status:  DNR  Goals of care: Advanced Directive information    08/17/2022    4:00 PM  Advanced Directives  Does Patient Have a Medical Advance Directive? Yes  Type of Estate agent of [place];Living will;Out of facility DNR (pink MOST or yellow form)  Does patient want to make changes to medical advance directive? No - Patient declined  Copy of Healthcare Power of Attorney in Chart? Yes - validated most recent copy scanned in chart (See row information)     Chief Complaint  Patient presents with   Acute Visit    Left eye redness.    HPI:  Pt is a 89 y.o. female seen today for an acute visit for left eye redness. She is a long-term care resident of [facility]. She was noted to have erythematous left eyelid and left conjunctiva. She denies itching of her eyes. No noted eye drainage nor complaints of pain and change in vision.  BP 125/72, takes Metoprolol tartrate for hypertension.   Past Medical History:  Diagnosis Date   Burst fracture of lumbar vertebra, sequela 12/27/2019   Closed left hip fracture (HCC) 04/07/2020   Diabetes mellitus type 2 in obese 11/09/2017   12/13/19 Hgb a1c 6.8, Na 141, K 4.3, Bun 16, creat 0.78, eGFR 67, wbc 9.6, Hgb 12.6, plt 318, neutrophils 60.9%, LDL 51  01/10/20 wbc 14.3, Hgb 13.6, plt 523, neutrophils 70.5%, Na 142, K 3.3, Bun 27, creat 0.62, eGFR 80  01/31/20 eye exam when the patient is able.   01/08/21 Hgb a1c 5.5   Fall as cause of accidental injury in residential  institution as place of occurrence 04/07/2020   Hearing loss    History of fracture of clavicle    Hypokalemia 01/21/2020   01/21/20 Na 141, K 4.1, Bun 12, creat 0.65, eGFR 79, wbc 9.2, Hgb 12.7, plt 331   Hypothyroidism    Insomnia    Right lower lobe pneumonia 01/14/2020   Tachycardia 04/03/2019   Tinnitus    Unspecified fracture of the lower end of left radius, initial encounter for closed fracture 04/07/2020   Past Surgical History:  Procedure Laterality Date   CHOLECYSTECTOMY  2007   Dr. Alan Mulder   CLAVICLE EXCISION  1986   INTRAMEDULLARY (IM) NAIL INTERTROCHANTERIC Left 04/09/2020   Procedure: LEFT INTERTROCHANTERIC INTRAMEDULLARY (IM) NAIL;  Surgeon: Tarry Kos, MD;  Location: [facility] OR;  Service: Orthopedics;  Laterality: Left;   MOHS SURGERY     past 10 years chest & legs   OPEN REDUCTION INTERNAL FIXATION (ORIF) DISTAL RADIAL FRACTURE Left 04/09/2020   Procedure: CLOSED REDUCTION LEFT DISTAL RADIUS FRACTURE;  Surgeon: Tarry Kos, MD;  Location: [facility] OR;  Service: Orthopedics;  Laterality: Left;    Allergies  Allergen Reactions   Metformin And Related Diarrhea    Outpatient Encounter Medications as of 08/17/2022  Medication Sig   acetaminophen (TYLENOL) 325 MG tablet Take 650 mg by mouth 3 (three) times daily as needed.   Cholecalciferol (D3) 50 MCG (2000 UT) TABS Take by mouth.  DIMETHICONE, TOPICAL, (CERAVE BABY) 1 % LOTN Apply 1 Application topically daily. Apply to shoulders, back, and chest.   divalproex (DEPAKOTE) 125 MG DR tablet Take 125 mg by mouth daily.   docusate sodium (COLACE) 100 MG capsule Take 100 mg by mouth daily.   Emollient (CERAVE) LOTN Apply 1 Application topically as directed. Apply to back and chest topically every shift every 2 days for dry skin.   Emollient (CERAVE) LOTN Apply 1 Application topically daily. Apply to back and chest.   erythromycin ophthalmic ointment Place 1 Application into the left eye 4 (four) times daily for 5 days.   Glucerna  (GLUCERNA) LIQD Take 240 mLs by mouth as needed.   hydrocortisone cream 1 % Apply 1 application  topically as needed. Apply to back, abdomen, and chest   ipratropium-albuterol (DUONEB) 0.5-2.5 (3) MG/3ML SOLN Take 3 mLs by nebulization every 6 (six) hours as needed.   levothyroxine (SYNTHROID) 75 MCG tablet TAKE 1 TABLET (75 MCG TOTAL) BY MOUTH DAILY BEFORE BREAKFAST.   metoprolol tartrate (LOPRESSOR) 25 MG tablet Take 12.5 mg by mouth 2 (two) times daily.   ondansetron (ZOFRAN) 4 MG tablet Take 1 tablet (4 mg total) by mouth 2 (two) times daily as needed for nausea or vomiting.   polyethylene glycol (MIRALAX / GLYCOLAX) 17 g packet Take 17 g by mouth daily as needed for moderate constipation.   QUEtiapine (SEROQUEL) 25 MG tablet Take 37.5 mg by mouth daily at 2 PM. Hold 09/13- 09/18 while on paxlovid   sertraline (ZOLOFT) 50 MG tablet Take 75 mg by mouth daily.   Sodium Fluoride (PREVIDENT 5000 BOOSTER PLUS) 1.1 % PSTE Place 1 Application onto teeth in the morning and at bedtime.   zinc oxide 20 % ointment Apply 1 application topically as needed for irritation.   No facility-administered encounter medications on file as of 08/17/2022.    Review of Systems  Unable to obtain due to dementia.    Immunization History  Administered Date(s) Administered   DTaP 07/20/2013   Fluad Quad(high Dose 65+) 12/23/2021   Influenza, High Dose Seasonal PF 12/07/2016   Influenza,inj,Quad PF,6+ Mos 12/01/2017   Influenza-Unspecified 11/20/2014, 12/12/2015, 11/20/2019   Moderna SARS-COV2 Booster Vaccination 08/06/2020   Moderna Sars-Covid-2 Vaccination 03/05/2019, 04/02/2019   PFIZER(Purple Top)SARS-COV-2 Vaccination 04/01/2020, 08/06/2020, 11/19/2020, 01/05/2022   PPD Test 08/06/2014   Pneumococcal Conjugate-13 11/07/2013   Pneumococcal Polysaccharide-23 03/22/2017   Tetanus 03/02/2015   Zoster Recombinat (Shingrix) 06/07/2017, 08/26/2017   Zoster, Live 03/01/2006   Pertinent  Health Maintenance Due   Topic Date Due   INFLUENZA VACCINE  09/30/2022   HEMOGLOBIN A1C  11/18/2022   OPHTHALMOLOGY EXAM  11/19/2022   FOOT EXAM  02/18/2023   DEXA SCAN  Completed      04/13/2020    8:00 PM 04/14/2020    7:47 AM 09/12/2020   12:07 PM 09/28/2021   10:30 AM 05/19/2022   12:28 PM  Fall Risk  Falls in the past year?   1 1 1   Was there an injury with Fall?   1 0 0  Fall Risk Category Calculator   3 2 1   Fall Risk Category (Retired)   High Moderate   (RETIRED) Patient Fall Risk Level High fall risk High fall risk  Moderate fall risk   Patient at Risk for Falls Due to   History of fall(s);Impaired balance/gait;Impaired mobility;Mental status change History of fall(s);Impaired balance/gait;Impaired mobility History of fall(s);Impaired balance/gait;Impaired mobility  Fall risk Follow up   Falls evaluation  completed;Education provided;Falls prevention discussed Falls evaluation completed;Education provided;Falls prevention discussed Falls evaluation completed;Education provided;Falls prevention discussed     Vitals:   08/17/22 1602  BP: 125/72  Pulse: 90  Resp: 18  Temp: 97.6 F (36.4 C)  SpO2: 95%  Weight: 153 lb 8 oz (69.6 kg)  Height: 5' (1.524 m)   Body mass index is 29.98 kg/m.  Physical Exam Constitutional:      General: She is not in acute distress. HENT:     Head: Normocephalic and atraumatic.     Nose: Nose normal.     Mouth/Throat:     Mouth: Mucous membranes are moist.  Eyes:     Conjunctiva/sclera: Conjunctivae normal.     Comments: Left eyelid and conjunctiva red  Cardiovascular:     Rate and Rhythm: Normal rate and regular rhythm.  Pulmonary:     Effort: Pulmonary effort is normal.     Breath sounds: Normal breath sounds.  Abdominal:     General: Bowel sounds are normal.     Palpations: Abdomen is soft.  Musculoskeletal:        General: Swelling present.     Cervical back: Normal range of motion.     Right lower leg: Edema present.     Left lower leg: Edema  present.     Comments: BLE+1 edema  Skin:    General: Skin is warm and dry.  Neurological:     General: No focal deficit present.     Mental Status: She is oriented to person, place, and time.  Psychiatric:        Mood and Affect: Mood normal.        Behavior: Behavior normal.        Labs reviewed: Recent Labs    11/05/21 0000 04/23/22 0000 05/18/22 0000  NA 138 137 142  K 4.2 4.4 4.4  CL 101 101 105  CO2 31* 26* 28*  BUN 23* 22* 23*  CREATININE 0.7 0.7 0.7  CALCIUM 9.4 9.2 8.7   Recent Labs    11/05/21 0000 04/23/22 0000 05/18/22 0000  AST 14 14 11*  ALT 13 10 13   ALKPHOS 96 95 100  ALBUMIN 3.9 3.8 3.4*   Recent Labs    11/05/21 0000 04/23/22 0000 05/18/22 0000  WBC 8.5 10.3 9.8  NEUTROABS  --  6,489.00  --   HGB 13.0 13.4 12.4  HCT 40 41 38  PLT 257 280 259   Lab Results  Component Value Date   TSH 2.18 07/02/2022   Lab Results  Component Value Date   HGBA1C 7.2 05/18/2022   Lab Results  Component Value Date   CHOL 158 05/18/2022   HDL 37 05/18/2022   LDLCALC 93 05/18/2022   TRIG 187 (A) 05/18/2022   CHOLHDL 3.0 12/13/2019    Significant Diagnostic Results in last 30 days:  No results found.  Assessment/Plan  1. Acute bacterial conjunctivitis of left eye -  eye care daily - erythromycin ophthalmic ointment; Place 1 Application into the left eye 4 (four) times daily for 5 days.  Dispense: 20 g; Refill: 0  2. Primary hypertension -  BPs stable -  continue Metoprolol  3. Vascular dementia with behavior disturbance (HCC) -  BIMS score 6/15, ranging in severe cognitive impairment -  continue supportive care -  fall precautions    Family/ staff Communication: Discussed plan of care with resident and charge nurse  Labs/tests ordered:  None    Marena Witts Medina-Vargas, DNP, MSN, FNP-BC  [facility] [phone] (Monday-Friday 8:00 a.m. - 5:00 p.m.) [phone] (after hours)

## 2022-08-23 NOTE — Progress Notes (Incomplete)
Location:  [facility] Room Number: 39-A Place of Service:  SNF (31) Provider:  Kenard Gower, DNP, FNP-BC  Patient Care Team: Mahlon Gammon, MD as PCP - General (Internal Medicine)  Extended Emergency Contact Information Primary Emergency Contact: Harris,Margaret Address: [address]          [place], [place] [address] D[place] of [place] Home Phone: [phone] Mobile Phone: [phone] Relation: Daughter  Code Status:  DNR  Goals of care: Advanced Directive information    08/17/2022    4:00 PM  Advanced Directives  Does Patient Have a Medical Advance Directive? Yes  Type of Estate agent of [place];Living will;Out of facility DNR (pink MOST or yellow form)  Does patient want to make changes to medical advance directive? No - Patient declined  Copy of Healthcare Power of Attorney in Chart? Yes - validated most recent copy scanned in chart (See row information)     Chief Complaint  Patient presents with  . Acute Visit    Left eye redness.    HPI:  Pt is a 89 y.o. female seen today for an acute visit for left eye redness. She is a long-term care resident of [facility]. She was noted to have erythematous left eyelid and left conjunctiva. She denies itching of her eyes. No noted eye drainage nor complaints of pain and change in vision.  BP 125/72, takes Metoprolol tartrate for hypertension.   Past Medical History:  Diagnosis Date  . Burst fracture of lumbar vertebra, sequela 12/27/2019  . Closed left hip fracture (HCC) 04/07/2020  . Diabetes mellitus type 2 in obese 11/09/2017   12/13/19 Hgb a1c 6.8, Na 141, K 4.3, Bun 16, creat 0.78, eGFR 67, wbc 9.6, Hgb 12.6, plt 318, neutrophils 60.9%, LDL 51  01/10/20 wbc 14.3, Hgb 13.6, plt 523, neutrophils 70.5%, Na 142, K 3.3, Bun 27, creat 0.62, eGFR 80  01/31/20 eye exam when the patient is able.   01/08/21 Hgb a1c 5.5  . Fall as cause of accidental injury in  residential institution as place of occurrence 04/07/2020  . Hearing loss   . History of fracture of clavicle   . Hypokalemia 01/21/2020   01/21/20 Na 141, K 4.1, Bun 12, creat 0.65, eGFR 79, wbc 9.2, Hgb 12.7, plt 331  . Hypothyroidism   . Insomnia   . Right lower lobe pneumonia 01/14/2020  . Tachycardia 04/03/2019  . Tinnitus   . Unspecified fracture of the lower end of left radius, initial encounter for closed fracture 04/07/2020   Past Surgical History:  Procedure Laterality Date  . CHOLECYSTECTOMY  2007   Dr. Alan Mulder  . CLAVICLE EXCISION  1986  . INTRAMEDULLARY (IM) NAIL INTERTROCHANTERIC Left 04/09/2020   Procedure: LEFT INTERTROCHANTERIC INTRAMEDULLARY (IM) NAIL;  Surgeon: Tarry Kos, MD;  Location: [facility] OR;  Service: Orthopedics;  Laterality: Left;  . MOHS SURGERY     past 10 years chest & legs  . OPEN REDUCTION INTERNAL FIXATION (ORIF) DISTAL RADIAL FRACTURE Left 04/09/2020   Procedure: CLOSED REDUCTION LEFT DISTAL RADIUS FRACTURE;  Surgeon: Tarry Kos, MD;  Location: [facility] OR;  Service: Orthopedics;  Laterality: Left;    Allergies  Allergen Reactions  . Metformin And Related Diarrhea    Outpatient Encounter Medications as of 08/17/2022  Medication Sig  . acetaminophen (TYLENOL) 325 MG tablet Take 650 mg by mouth 3 (three) times daily as needed.  . Cholecalciferol (D3) 50 MCG (2000 UT) TABS Take by mouth.  Marland Kitchen  DIMETHICONE, TOPICAL, (CERAVE BABY) 1 % LOTN Apply 1 Application topically daily. Apply to shoulders, back, and chest.  . divalproex (DEPAKOTE) 125 MG DR tablet Take 125 mg by mouth daily.  Marland Kitchen docusate sodium (COLACE) 100 MG capsule Take 100 mg by mouth daily.  . Emollient (CERAVE) LOTN Apply 1 Application topically as directed. Apply to back and chest topically every shift every 2 days for dry skin.  . Emollient (CERAVE) LOTN Apply 1 Application topically daily. Apply to back and chest.  . erythromycin ophthalmic ointment Place 1 Application into the left eye 4 (four)  times daily for 5 days.  . Glucerna (GLUCERNA) LIQD Take 240 mLs by mouth as needed.  . hydrocortisone cream 1 % Apply 1 application  topically as needed. Apply to back, abdomen, and chest  . ipratropium-albuterol (DUONEB) 0.5-2.5 (3) MG/3ML SOLN Take 3 mLs by nebulization every 6 (six) hours as needed.  Marland Kitchen levothyroxine (SYNTHROID) 75 MCG tablet TAKE 1 TABLET (75 MCG TOTAL) BY MOUTH DAILY BEFORE BREAKFAST.  . metoprolol tartrate (LOPRESSOR) 25 MG tablet Take 12.5 mg by mouth 2 (two) times daily.  . ondansetron (ZOFRAN) 4 MG tablet Take 1 tablet (4 mg total) by mouth 2 (two) times daily as needed for nausea or vomiting.  . polyethylene glycol (MIRALAX / GLYCOLAX) 17 g packet Take 17 g by mouth daily as needed for moderate constipation.  . QUEtiapine (SEROQUEL) 25 MG tablet Take 37.5 mg by mouth daily at 2 PM. Hold 09/13- 09/18 while on paxlovid  . sertraline (ZOLOFT) 50 MG tablet Take 75 mg by mouth daily.  . Sodium Fluoride (PREVIDENT 5000 BOOSTER PLUS) 1.1 % PSTE Place 1 Application onto teeth in the morning and at bedtime.  Marland Kitchen zinc oxide 20 % ointment Apply 1 application topically as needed for irritation.   No facility-administered encounter medications on file as of 08/17/2022.    Review of Systems  Unable to obtain due to dementia.    Immunization History  Administered Date(s) Administered  . DTaP 07/20/2013  . Fluad Quad(high Dose 65+) 12/23/2021  . Influenza, High Dose Seasonal PF 12/07/2016  . Influenza,inj,Quad PF,6+ Mos 12/01/2017  . Influenza-Unspecified 11/20/2014, 12/12/2015, 11/20/2019  . Moderna SARS-COV2 Booster Vaccination 08/06/2020  . Moderna Sars-Covid-2 Vaccination 03/05/2019, 04/02/2019  . PFIZER(Purple Top)SARS-COV-2 Vaccination 04/01/2020, 08/06/2020, 11/19/2020, 01/05/2022  . PPD Test 08/06/2014  . Pneumococcal Conjugate-13 11/07/2013  . Pneumococcal Polysaccharide-23 03/22/2017  . Tetanus 03/02/2015  . Zoster Recombinat (Shingrix) 06/07/2017, 08/26/2017  .  Zoster, Live 03/01/2006   Pertinent  Health Maintenance Due  Topic Date Due  . INFLUENZA VACCINE  09/30/2022  . HEMOGLOBIN A1C  11/18/2022  . OPHTHALMOLOGY EXAM  11/19/2022  . FOOT EXAM  02/18/2023  . DEXA SCAN  Completed      04/13/2020    8:00 PM 04/14/2020    7:47 AM 09/12/2020   12:07 PM 09/28/2021   10:30 AM 05/19/2022   12:28 PM  Fall Risk  Falls in the past year?   1 1 1   Was there an injury with Fall?   1 0 0  Fall Risk Category Calculator   3 2 1   Fall Risk Category (Retired)   High Moderate   (RETIRED) Patient Fall Risk Level High fall risk High fall risk  Moderate fall risk   Patient at Risk for Falls Due to   History of fall(s);Impaired balance/gait;Impaired mobility;Mental status change History of fall(s);Impaired balance/gait;Impaired mobility History of fall(s);Impaired balance/gait;Impaired mobility  Fall risk Follow up   Falls evaluation  completed;Education provided;Falls prevention discussed Falls evaluation completed;Education provided;Falls prevention discussed Falls evaluation completed;Education provided;Falls prevention discussed     Vitals:   08/17/22 1602  BP: 125/72  Pulse: 90  Resp: 18  Temp: 97.6 F (36.4 C)  SpO2: 95%  Weight: 153 lb 8 oz (69.6 kg)  Height: 5' (1.524 m)   Body mass index is 29.98 kg/m.  Physical Exam     Labs reviewed: Recent Labs    11/05/21 0000 04/23/22 0000 05/18/22 0000  NA 138 137 142  K 4.2 4.4 4.4  CL 101 101 105  CO2 31* 26* 28*  BUN 23* 22* 23*  CREATININE 0.7 0.7 0.7  CALCIUM 9.4 9.2 8.7   Recent Labs    11/05/21 0000 04/23/22 0000 05/18/22 0000  AST 14 14 11*  ALT 13 10 13   ALKPHOS 96 95 100  ALBUMIN 3.9 3.8 3.4*   Recent Labs    11/05/21 0000 04/23/22 0000 05/18/22 0000  WBC 8.5 10.3 9.8  NEUTROABS  --  6,489.00  --   HGB 13.0 13.4 12.4  HCT 40 41 38  PLT 257 280 259   Lab Results  Component Value Date   TSH 2.18 07/02/2022   Lab Results  Component Value Date   HGBA1C 7.2  05/18/2022   Lab Results  Component Value Date   CHOL 158 05/18/2022   HDL 37 05/18/2022   LDLCALC 93 05/18/2022   TRIG 187 (A) 05/18/2022   CHOLHDL 3.0 12/13/2019    Significant Diagnostic Results in last 30 days:  No results found.  Assessment/Plan     Family/ staff Communication: Discussed plan of care with resident and charge nurse  Labs/tests ordered:     Kenard Gower, DNP, MSN, FNP-BC [facility] [phone] (Monday-Friday 8:00 a.m. - 5:00 p.m.) [phone] (after hours)

## 2022-08-31 ENCOUNTER — Encounter: Payer: Self-pay | Admitting: Adult Health

## 2022-08-31 ENCOUNTER — Non-Acute Institutional Stay (SKILLED_NURSING_FACILITY): Payer: Medicare Other | Admitting: Adult Health

## 2022-08-31 DIAGNOSIS — R051 Acute cough: Secondary | ICD-10-CM

## 2022-08-31 DIAGNOSIS — J189 Pneumonia, unspecified organism: Secondary | ICD-10-CM | POA: Diagnosis not present

## 2022-08-31 DIAGNOSIS — D649 Anemia, unspecified: Secondary | ICD-10-CM | POA: Diagnosis not present

## 2022-08-31 DIAGNOSIS — I10 Essential (primary) hypertension: Secondary | ICD-10-CM

## 2022-08-31 DIAGNOSIS — F339 Major depressive disorder, recurrent, unspecified: Secondary | ICD-10-CM

## 2022-08-31 DIAGNOSIS — J9601 Acute respiratory failure with hypoxia: Secondary | ICD-10-CM | POA: Diagnosis not present

## 2022-08-31 DIAGNOSIS — R918 Other nonspecific abnormal finding of lung field: Secondary | ICD-10-CM | POA: Diagnosis not present

## 2022-08-31 DIAGNOSIS — R630 Anorexia: Secondary | ICD-10-CM

## 2022-08-31 DIAGNOSIS — F01518 Vascular dementia, unspecified severity, with other behavioral disturbance: Secondary | ICD-10-CM

## 2022-08-31 DIAGNOSIS — E039 Hypothyroidism, unspecified: Secondary | ICD-10-CM | POA: Diagnosis not present

## 2022-08-31 LAB — CBC AND DIFFERENTIAL
HCT: 39 (ref 36–46)
Hemoglobin: 12.6 (ref 12.0–16.0)
Neutrophils Absolute: 7085
Platelets: 262 10*3/uL (ref 150–400)
WBC: 9.6

## 2022-08-31 LAB — BASIC METABOLIC PANEL
BUN: 16 (ref 4–21)
CO2: 27 — AB (ref 13–22)
Chloride: 102 (ref 99–108)
Creatinine: 0.7 (ref 0.5–1.1)
Glucose: 185
Potassium: 3.9 mEq/L (ref 3.5–5.1)
Sodium: 137 (ref 137–147)

## 2022-08-31 LAB — COMPREHENSIVE METABOLIC PANEL
Calcium: 9.1 (ref 8.7–10.7)
eGFR: 81

## 2022-08-31 LAB — CBC: RBC: 4.73 (ref 3.87–5.11)

## 2022-08-31 MED ORDER — ALBUTEROL SULFATE (2.5 MG/3ML) 0.083% IN NEBU
2.5000 mg | INHALATION_SOLUTION | Freq: Two times a day (BID) | RESPIRATORY_TRACT | 0 refills | Status: DC
Start: 2022-08-31 — End: 2022-11-25

## 2022-08-31 MED ORDER — GUAIFENESIN 100 MG/5ML PO LIQD
10.0000 mL | Freq: Three times a day (TID) | ORAL | 0 refills | Status: AC
Start: 2022-08-31 — End: 2022-09-05

## 2022-08-31 MED ORDER — DOXYCYCLINE HYCLATE 100 MG PO TABS
100.0000 mg | ORAL_TABLET | Freq: Two times a day (BID) | ORAL | 0 refills | Status: AC
Start: 2022-08-31 — End: 2022-09-10

## 2022-08-31 NOTE — Addendum Note (Signed)
Addended by: Kenard Gower C on: 08/31/2022 12:35 PM   Modules accepted: Orders

## 2022-08-31 NOTE — Progress Notes (Addendum)
Location:  [facility] Room Number: 39-A Place of Service:  SNF (31) Provider:  Kenard Gower, DNP, FNP-BC  Patient Care Team: Mahlon Gammon, MD as PCP - General (Internal Medicine)  Extended Emergency Contact Information Primary Emergency Contact: Harris,Margaret Address: [address]          [place], [place] [address] [place] of [place] Home Phone: [phone] Mobile Phone: [phone] Relation: Daughter  Code Status:  DNR  Goals of care: Advanced Directive information    08/31/2022    9:06 AM  Advanced Directives  Does Patient Have a Medical Advance Directive? Yes  Type of Estate agent of [place];Living will;Out of facility DNR (pink MOST or yellow form)  Does patient want to make changes to medical advance directive? No - Patient declined  Copy of Healthcare Power of Attorney in Chart? Yes - validated most recent copy scanned in chart (See row information)     Chief Complaint  Patient presents with   Acute Visit    Wheezing and cough    HPI:  Pt is a 89 y.o. female seen today for an acute visit for cough and wheezing. She is a long-term care resident of [facility]. She was noted to be coughing while eating breakfast. She has rhonchi on bilateral chest. O2 sat 89-90% but not labored. She did not eat breakfast. It was reported that she ate yesterday. Talked to daughter Josephina Shih, and updated her regarding resident's condition. She stated that she noted resident to be coughing 3 days ago and thought that it might be due to her lying flat on the bed. No reported fever.  COVID-19 test was negative.  Past Medical History:  Diagnosis Date   Burst fracture of lumbar vertebra, sequela 12/27/2019   Closed left hip fracture (HCC) 04/07/2020   Diabetes mellitus type 2 in obese 11/09/2017   12/13/19 Hgb a1c 6.8, Na 141, K 4.3, Bun 16, creat 0.78, eGFR 67, wbc 9.6, Hgb 12.6, plt 318, neutrophils 60.9%, LDL 51   01/10/20 wbc 14.3, Hgb 13.6, plt 523, neutrophils 70.5%, Na 142, K 3.3, Bun 27, creat 0.62, eGFR 80  01/31/20 eye exam when the patient is able.   01/08/21 Hgb a1c 5.5   Fall as cause of accidental injury in residential institution as place of occurrence 04/07/2020   Hearing loss    History of fracture of clavicle    Hypokalemia 01/21/2020   01/21/20 Na 141, K 4.1, Bun 12, creat 0.65, eGFR 79, wbc 9.2, Hgb 12.7, plt 331   Hypothyroidism    Insomnia    Right lower lobe pneumonia 01/14/2020   Tachycardia 04/03/2019   Tinnitus    Unspecified fracture of the lower end of left radius, initial encounter for closed fracture 04/07/2020   Past Surgical History:  Procedure Laterality Date   CHOLECYSTECTOMY  2007   Dr. Alan Mulder   CLAVICLE EXCISION  1986   INTRAMEDULLARY (IM) NAIL INTERTROCHANTERIC Left 04/09/2020   Procedure: LEFT INTERTROCHANTERIC INTRAMEDULLARY (IM) NAIL;  Surgeon: Tarry Kos, MD;  Location: [facility] OR;  Service: Orthopedics;  Laterality: Left;   MOHS SURGERY     past 10 years chest & legs   OPEN REDUCTION INTERNAL FIXATION (ORIF) DISTAL RADIAL FRACTURE Left 04/09/2020   Procedure: CLOSED REDUCTION LEFT DISTAL RADIUS FRACTURE;  Surgeon: Tarry Kos, MD;  Location: [facility] OR;  Service: Orthopedics;  Laterality: Left;    Allergies  Allergen Reactions   Metformin And Related Diarrhea  Outpatient Encounter Medications as of 08/31/2022  Medication Sig   acetaminophen (TYLENOL) 325 MG tablet Take 650 mg by mouth 3 (three) times daily as needed.   Cholecalciferol (D3) 50 MCG (2000 UT) TABS Take by mouth.   DIMETHICONE, TOPICAL, (CERAVE BABY) 1 % LOTN Apply 1 Application topically daily. Apply to shoulders, back, and chest.   divalproex (DEPAKOTE) 125 MG DR tablet Take 125 mg by mouth daily.   docusate sodium (COLACE) 100 MG capsule Take 100 mg by mouth daily.   Emollient (CERAVE) LOTN Apply 1 Application topically as directed. Apply to back and chest topically every shift every 2 days for  dry skin.   Emollient (CERAVE) LOTN Apply 1 Application topically daily. Apply to back and chest.   Glucerna (GLUCERNA) LIQD Take 240 mLs by mouth as needed.   hydrocortisone cream 1 % Apply 1 application  topically as needed. Apply to back, abdomen, and chest   ipratropium-albuterol (DUONEB) 0.5-2.5 (3) MG/3ML SOLN Take 3 mLs by nebulization every 6 (six) hours as needed.   levothyroxine (SYNTHROID) 75 MCG tablet TAKE 1 TABLET (75 MCG TOTAL) BY MOUTH DAILY BEFORE BREAKFAST.   metoprolol tartrate (LOPRESSOR) 25 MG tablet Take 12.5 mg by mouth 2 (two) times daily.   ondansetron (ZOFRAN) 4 MG tablet Take 1 tablet (4 mg total) by mouth 2 (two) times daily as needed for nausea or vomiting.   polyethylene glycol (MIRALAX / GLYCOLAX) 17 g packet Take 17 g by mouth daily as needed for moderate constipation.   QUEtiapine (SEROQUEL) 25 MG tablet Take 37.5 mg by mouth daily at 2 PM. Hold 09/13- 09/18 while on paxlovid   sertraline (ZOLOFT) 50 MG tablet Take 75 mg by mouth daily.   Sodium Fluoride (PREVIDENT 5000 BOOSTER PLUS) 1.1 % PSTE Place 1 Application onto teeth in the morning and at bedtime.   zinc oxide 20 % ointment Apply 1 application topically as needed for irritation.   No facility-administered encounter medications on file as of 08/31/2022.    Review of Systems  Unable to obtain due to dementia.    Immunization History  Administered Date(s) Administered   DTaP 07/20/2013   Fluad Quad(high Dose 65+) 12/23/2021   Influenza, High Dose Seasonal PF 12/07/2016   Influenza,inj,Quad PF,6+ Mos 12/01/2017   Influenza-Unspecified 11/20/2014, 12/12/2015, 11/20/2019   Moderna SARS-COV2 Booster Vaccination 08/06/2020   Moderna Sars-Covid-2 Vaccination 03/05/2019, 04/02/2019   PFIZER(Purple Top)SARS-COV-2 Vaccination 04/01/2020, 08/06/2020, 11/19/2020, 01/05/2022   PPD Test 08/06/2014   Pneumococcal Conjugate-13 11/07/2013   Pneumococcal Polysaccharide-23 03/22/2017   Tetanus 03/02/2015   Zoster  Recombinant(Shingrix) 06/07/2017, 08/26/2017   Zoster, Live 03/01/2006   Pertinent  Health Maintenance Due  Topic Date Due   INFLUENZA VACCINE  09/30/2022   HEMOGLOBIN A1C  11/18/2022   OPHTHALMOLOGY EXAM  11/19/2022   FOOT EXAM  02/18/2023   DEXA SCAN  Completed      04/13/2020    8:00 PM 04/14/2020    7:47 AM 09/12/2020   12:07 PM 09/28/2021   10:30 AM 05/19/2022   12:28 PM  Fall Risk  Falls in the past year?   1 1 1   Was there an injury with Fall?   1 0 0  Fall Risk Category Calculator   3 2 1   Fall Risk Category (Retired)   High Moderate   (RETIRED) Patient Fall Risk Level High fall risk High fall risk  Moderate fall risk   Patient at Risk for Falls Due to   History of fall(s);Impaired balance/gait;Impaired mobility;Mental  status change History of fall(s);Impaired balance/gait;Impaired mobility History of fall(s);Impaired balance/gait;Impaired mobility  Fall risk Follow up   Falls evaluation completed;Education provided;Falls prevention discussed Falls evaluation completed;Education provided;Falls prevention discussed Falls evaluation completed;Education provided;Falls prevention discussed     Vitals:   08/31/22 0908  BP: 128/73  Pulse: 95  Resp: 17  Temp: 97.6 F (36.4 C)  SpO2: 97%  Weight: 156 lb 1.6 oz (70.8 kg)  Height: 5' (1.524 m)   Body mass index is 30.49 kg/m.  Physical Exam Constitutional:      Appearance: She is obese.  HENT:     Head: Normocephalic and atraumatic.     Nose: Nose normal.     Mouth/Throat:     Mouth: Mucous membranes are moist.  Eyes:     Conjunctiva/sclera: Conjunctivae normal.  Cardiovascular:     Rate and Rhythm: Normal rate and regular rhythm.  Pulmonary:     Effort: Pulmonary effort is normal.     Breath sounds: Rhonchi and rales present.  Abdominal:     General: Bowel sounds are normal.     Palpations: Abdomen is soft.  Musculoskeletal:     Cervical back: Normal range of motion.  Skin:    General: Skin is warm and dry.   Neurological:     Mental Status: She is alert. Mental status is at baseline.     Comments: Alert to self, disoriented to time and place.  Psychiatric:        Mood and Affect: Mood normal.        Behavior: Behavior normal.        Labs reviewed: Recent Labs    11/05/21 0000 04/23/22 0000 05/18/22 0000  NA 138 137 142  K 4.2 4.4 4.4  CL 101 101 105  CO2 31* 26* 28*  BUN 23* 22* 23*  CREATININE 0.7 0.7 0.7  CALCIUM 9.4 9.2 8.7   Recent Labs    11/05/21 0000 04/23/22 0000 05/18/22 0000  AST 14 14 11*  ALT 13 10 13   ALKPHOS 96 95 100  ALBUMIN 3.9 3.8 3.4*   Recent Labs    11/05/21 0000 04/23/22 0000 05/18/22 0000  WBC 8.5 10.3 9.8  NEUTROABS  --  6,489.00  --   HGB 13.0 13.4 12.4  HCT 40 41 38  PLT 257 280 259   Lab Results  Component Value Date   TSH 2.18 07/02/2022   Lab Results  Component Value Date   HGBA1C 7.2 05/18/2022   Lab Results  Component Value Date   CHOL 158 05/18/2022   HDL 37 05/18/2022   LDLCALC 93 05/18/2022   TRIG 187 (A) 05/18/2022   CHOLHDL 3.0 12/13/2019    Significant Diagnostic Results in last 30 days:  No results found.  Assessment/Plan  1. Pneumonia of right lower lobe due to infectious organism Acute cough -  chest x-ray showed small right base infiltrate -  will start on Doxycycline 100 mg BID X 10 days - guaiFENesin (ROBITUSSIN) 100 MG/5ML liquid; Take 10 mLs by mouth every 8 (eight) hours for 5 days.  Dispense: 150 mL; Refill: 0 -  elevate head of bed at least 30 degrees when lying down -  chest x-ray will be done to rule out pneumonia -  CBC ordered  2. Poor appetite -  did not eat breakfast, coughing while trying to eat -  ST evaluation for appropriate food consistency -  BMP ordered  3. Primary hypertension -  BP 143/76 -  continue Metoprolol  tartrate  4. Acquired hypothyroidism Lab Results  Component Value Date   TSH 2.18 07/02/2022   -  continue Levothyroxine  5. Recurrent depression (HCC) -   mood is stable -  continue Sertraline and Seroquel  6. Vascular dementia with behavior disturbance (HCC) -  continue supportive care  7. Acute respiratory failure with hypoxia (HCC) -  start on O2 @ 2L/min via [place] PRN - albuterol (PROVENTIL) (2.5 MG/3ML) 0.083% nebulizer solution; Take 3 mLs (2.5 mg total) by nebulization 2 (two) times daily for 3 days.  Dispense: 18 mL; Refill: 0    Family/ staff Communication: Discussed plan of care with daughter and charge nurse  Labs/tests ordered:  CBC and BMP    Kenard Gower, DNP, MSN, FNP-BC [facility] [phone] (Monday-Friday 8:00 a.m. - 5:00 p.m.) [phone] (after hours)

## 2022-09-01 DIAGNOSIS — F01518 Vascular dementia, unspecified severity, with other behavioral disturbance: Secondary | ICD-10-CM | POA: Diagnosis not present

## 2022-09-01 DIAGNOSIS — F418 Other specified anxiety disorders: Secondary | ICD-10-CM | POA: Diagnosis not present

## 2022-09-01 DIAGNOSIS — R4701 Aphasia: Secondary | ICD-10-CM | POA: Diagnosis not present

## 2022-09-01 DIAGNOSIS — F0280 Dementia in other diseases classified elsewhere without behavioral disturbance: Secondary | ICD-10-CM | POA: Diagnosis not present

## 2022-09-01 DIAGNOSIS — E039 Hypothyroidism, unspecified: Secondary | ICD-10-CM | POA: Diagnosis not present

## 2022-09-01 DIAGNOSIS — R1310 Dysphagia, unspecified: Secondary | ICD-10-CM | POA: Diagnosis not present

## 2022-09-02 DIAGNOSIS — R4701 Aphasia: Secondary | ICD-10-CM | POA: Diagnosis not present

## 2022-09-02 DIAGNOSIS — F01518 Vascular dementia, unspecified severity, with other behavioral disturbance: Secondary | ICD-10-CM | POA: Diagnosis not present

## 2022-09-02 DIAGNOSIS — F0280 Dementia in other diseases classified elsewhere without behavioral disturbance: Secondary | ICD-10-CM | POA: Diagnosis not present

## 2022-09-02 DIAGNOSIS — R1310 Dysphagia, unspecified: Secondary | ICD-10-CM | POA: Diagnosis not present

## 2022-09-02 DIAGNOSIS — F418 Other specified anxiety disorders: Secondary | ICD-10-CM | POA: Diagnosis not present

## 2022-09-02 DIAGNOSIS — E039 Hypothyroidism, unspecified: Secondary | ICD-10-CM | POA: Diagnosis not present

## 2022-09-03 DIAGNOSIS — F01518 Vascular dementia, unspecified severity, with other behavioral disturbance: Secondary | ICD-10-CM | POA: Diagnosis not present

## 2022-09-03 DIAGNOSIS — E039 Hypothyroidism, unspecified: Secondary | ICD-10-CM | POA: Diagnosis not present

## 2022-09-03 DIAGNOSIS — F0280 Dementia in other diseases classified elsewhere without behavioral disturbance: Secondary | ICD-10-CM | POA: Diagnosis not present

## 2022-09-03 DIAGNOSIS — R1310 Dysphagia, unspecified: Secondary | ICD-10-CM | POA: Diagnosis not present

## 2022-09-03 DIAGNOSIS — F418 Other specified anxiety disorders: Secondary | ICD-10-CM | POA: Diagnosis not present

## 2022-09-03 DIAGNOSIS — R4701 Aphasia: Secondary | ICD-10-CM | POA: Diagnosis not present

## 2022-09-10 ENCOUNTER — Non-Acute Institutional Stay (SKILLED_NURSING_FACILITY): Payer: Medicare Other | Admitting: Orthopedic Surgery

## 2022-09-10 ENCOUNTER — Encounter: Payer: Self-pay | Admitting: Orthopedic Surgery

## 2022-09-10 DIAGNOSIS — E039 Hypothyroidism, unspecified: Secondary | ICD-10-CM

## 2022-09-10 DIAGNOSIS — E1165 Type 2 diabetes mellitus with hyperglycemia: Secondary | ICD-10-CM

## 2022-09-10 DIAGNOSIS — F419 Anxiety disorder, unspecified: Secondary | ICD-10-CM

## 2022-09-10 DIAGNOSIS — F03918 Unspecified dementia, unspecified severity, with other behavioral disturbance: Secondary | ICD-10-CM

## 2022-09-10 DIAGNOSIS — I10 Essential (primary) hypertension: Secondary | ICD-10-CM

## 2022-09-10 DIAGNOSIS — F339 Major depressive disorder, recurrent, unspecified: Secondary | ICD-10-CM | POA: Diagnosis not present

## 2022-09-10 DIAGNOSIS — J189 Pneumonia, unspecified organism: Secondary | ICD-10-CM

## 2022-09-10 MED ORDER — GUAIFENESIN ER 600 MG PO TB12
600.0000 mg | ORAL_TABLET | Freq: Two times a day (BID) | ORAL | Status: AC
Start: 2022-09-10 — End: 2022-09-15

## 2022-09-10 NOTE — Progress Notes (Signed)
Location:  [facility] Room Number: 39A Place of Service:  SNF [phone]) Provider:  Zareena Willis Roland Earl, MD  Patient Care Team: Mahlon Gammon, MD as PCP - General (Internal Medicine)  Extended Emergency Contact Information Primary Emergency Contact: Harris,Margaret Address: [address]          [place], [place] [address] Darden [place] of [place] Home Phone: [phone] Mobile Phone: [phone] Relation: Daughter  Code Status:  DNR Palliative care  Goals of care: Advanced Directive information    09/10/2022    9:12 AM  Advanced Directives  Does Patient Have a Medical Advance Directive? Yes  Type of Estate agent of [place];Living will;Out of facility DNR (pink MOST or yellow form)  Does patient want to make changes to medical advance directive? No - Patient declined  Copy of Healthcare Power of Attorney in Chart? Yes - validated most recent copy scanned in chart (See row information)     Chief Complaint  Patient presents with   Medical Management of Chronic Issues    [name] being seen for a routine visit     HPI:  Pt is a 89 y.o. female seen today for medical management of chronic diseases.    She currently resides on the skilled nursing unit at [facility]. Past medical includes: HTN, constipation, T2DM, hypothyroidism, OA, urinary retention, vascular dementia, depression and anxiety.     PNA- 07/02 CXR revealed infiltrate to RLL, completed doxycycline x 10 days, continues to cough up phlegm T2DM- A1c 7.2 (03/19)> was 6.5 (12/22), blood sugars averaging 150-200's, no hypoglycemic events, diet controlled Hypothyroidism- TSH 2.18 07/02/2022, some weight gain within last year, see trends below, remains on levothyroxine Dementia- followed by palliative, BIMS> unable to complete 06/07> was  6/15 (02/27), CT head 04/2020 age related atrophy and chronic small vessel ischemia, no behaviors, less talkative, remains on  Depakote and Seroquel Depression/anxiety- see above, good family support, remains on Zoloft, Na+ 137 08/31/2022, off Ativan at this time  HTN- BUN/creat 23/0.70 05/17/2022, remains on metoprolol   No recent falls or injuries.   Recent weights:  07/01- 156.1 lbs  06/03- 154.1 lbs  05/01- 155.9 lbs  Recent blood pressures:  07/09- 148/78  0702- 119/71  06/25- 128/73   Past Medical History:  Diagnosis Date   Burst fracture of lumbar vertebra, sequela 12/27/2019   Closed left hip fracture (HCC) 04/07/2020   Diabetes mellitus type 2 in obese 11/09/2017   12/13/19 Hgb a1c 6.8, Na 141, K 4.3, Bun 16, creat 0.78, eGFR 67, wbc 9.6, Hgb 12.6, plt 318, neutrophils 60.9%, LDL 51  01/10/20 wbc 14.3, Hgb 13.6, plt 523, neutrophils 70.5%, Na 142, K 3.3, Bun 27, creat 0.62, eGFR 80  01/31/20 eye exam when the patient is able.   01/08/21 Hgb a1c 5.5   Fall as cause of accidental injury in residential institution as place of occurrence 04/07/2020   Hearing loss    History of fracture of clavicle    Hypokalemia 01/21/2020   01/21/20 Na 141, K 4.1, Bun 12, creat 0.65, eGFR 79, wbc 9.2, Hgb 12.7, plt 331   Hypothyroidism    Insomnia    Right lower lobe pneumonia 01/14/2020   Tachycardia 04/03/2019   Tinnitus    Unspecified fracture of the lower end of left radius, initial encounter for closed fracture 04/07/2020   Past Surgical History:  Procedure Laterality Date   CHOLECYSTECTOMY  2007   Dr. Alan Mulder   CLAVICLE EXCISION  1986   INTRAMEDULLARY (IM) NAIL INTERTROCHANTERIC Left 04/09/2020   Procedure: LEFT INTERTROCHANTERIC INTRAMEDULLARY (IM) NAIL;  Surgeon: Tarry Kos, MD;  Location: [facility] OR;  Service: Orthopedics;  Laterality: Left;   MOHS SURGERY     past 10 years chest & legs   OPEN REDUCTION INTERNAL FIXATION (ORIF) DISTAL RADIAL FRACTURE Left 04/09/2020   Procedure: CLOSED REDUCTION LEFT DISTAL RADIUS FRACTURE;  Surgeon: Tarry Kos, MD;  Location: [facility] OR;  Service: Orthopedics;  Laterality:  Left;    Allergies  Allergen Reactions   Metformin And Related Diarrhea    Outpatient Encounter Medications as of 09/10/2022  Medication Sig   acetaminophen (TYLENOL) 325 MG tablet Take 650 mg by mouth 3 (three) times daily as needed.   Cholecalciferol (D3) 50 MCG (2000 UT) TABS Take by mouth.   DIMETHICONE, TOPICAL, (CERAVE BABY) 1 % LOTN Apply 1 Application topically daily. Apply to shoulders, back, and chest.   divalproex (DEPAKOTE) 125 MG DR tablet Take 125 mg by mouth daily.   docusate sodium (COLACE) 100 MG capsule Take 100 mg by mouth daily.   doxycycline (VIBRA-TABS) 100 MG tablet Take 1 tablet (100 mg total) by mouth 2 (two) times daily for 10 days.   Emollient (CERAVE) LOTN Apply 1 Application topically as directed. Apply to back and chest topically every shift every 2 days for dry skin.   Emollient (CERAVE) LOTN Apply 1 Application topically daily. Apply to back and chest.   Glucerna (GLUCERNA) LIQD Take 240 mLs by mouth as needed.   hydrocortisone cream 1 % Apply 1 application  topically as needed. Apply to back, abdomen, and chest   ipratropium-albuterol (DUONEB) 0.5-2.5 (3) MG/3ML SOLN Take 3 mLs by nebulization every 6 (six) hours as needed.   levothyroxine (SYNTHROID) 75 MCG tablet TAKE 1 TABLET (75 MCG TOTAL) BY MOUTH DAILY BEFORE BREAKFAST.   metoprolol tartrate (LOPRESSOR) 25 MG tablet Take 12.5 mg by mouth 2 (two) times daily.   nystatin powder Apply 1 Application topically 3 (three) times daily.   ondansetron (ZOFRAN) 4 MG tablet Take 1 tablet (4 mg total) by mouth 2 (two) times daily as needed for nausea or vomiting.   polyethylene glycol (MIRALAX / GLYCOLAX) 17 g packet Take 17 g by mouth daily as needed for moderate constipation.   QUEtiapine (SEROQUEL) 25 MG tablet Take 37.5 mg by mouth daily at 2 PM. Hold 09/13- 09/18 while on paxlovid   sertraline (ZOLOFT) 50 MG tablet Take 75 mg by mouth daily.   Sodium Fluoride (PREVIDENT 5000 BOOSTER PLUS) 1.1 % PSTE Place 1  Application onto teeth in the morning and at bedtime.   zinc oxide 20 % ointment Apply 1 application topically as needed for irritation.   albuterol (PROVENTIL) (2.5 MG/3ML) 0.083% nebulizer solution Take 3 mLs (2.5 mg total) by nebulization 2 (two) times daily for 3 days.   No facility-administered encounter medications on file as of 09/10/2022.    Review of Systems  Unable to perform ROS: Dementia    Immunization History  Administered Date(s) Administered   DTaP 07/20/2013   Fluad Quad(high Dose 65+) 12/23/2021   Influenza, High Dose Seasonal PF 12/07/2016   Influenza,inj,Quad PF,6+ Mos 12/01/2017   Influenza-Unspecified 11/20/2014, 12/12/2015, 11/20/2019   Moderna SARS-COV2 Booster Vaccination 08/06/2020   Moderna Sars-Covid-2 Vaccination 03/05/2019, 04/02/2019   PFIZER(Purple Top)SARS-COV-2 Vaccination 04/01/2020, 08/06/2020, 11/19/2020, 01/05/2022   PPD Test 08/06/2014   Pneumococcal Conjugate-13 11/07/2013   Pneumococcal Polysaccharide-23 03/22/2017   Tetanus 03/02/2015   Zoster Recombinant(Shingrix) 06/07/2017,  08/26/2017   Zoster, Live 03/01/2006   Pertinent  Health Maintenance Due  Topic Date Due   INFLUENZA VACCINE  09/30/2022   HEMOGLOBIN A1C  11/18/2022   OPHTHALMOLOGY EXAM  11/19/2022   FOOT EXAM  02/18/2023   DEXA SCAN  Completed      04/13/2020    8:00 PM 04/14/2020    7:47 AM 09/12/2020   12:07 PM 09/28/2021   10:30 AM 05/19/2022   12:28 PM  Fall Risk  Falls in the past year?   1 1 1   Was there an injury with Fall?   1 0 0  Fall Risk Category Calculator   3 2 1   Fall Risk Category (Retired)   High Moderate   (RETIRED) Patient Fall Risk Level High fall risk High fall risk  Moderate fall risk   Patient at Risk for Falls Due to   History of fall(s);Impaired balance/gait;Impaired mobility;Mental status change History of fall(s);Impaired balance/gait;Impaired mobility History of fall(s);Impaired balance/gait;Impaired mobility  Fall risk Follow up   Falls  evaluation completed;Education provided;Falls prevention discussed Falls evaluation completed;Education provided;Falls prevention discussed Falls evaluation completed;Education provided;Falls prevention discussed   Functional Status Survey:    Vitals:   09/10/22 0910  BP: (!) 148/78  Pulse: (!) 103  Resp: 20  Temp: 97.6 F (36.4 C)  TempSrc: Temporal  SpO2: 99%  Weight: 156 lb 1.6 oz (70.8 kg)  Height: 5' (1.524 m)   Body mass index is 30.49 kg/m. Physical Exam Vitals reviewed.  Constitutional:      General: She is not in acute distress. HENT:     Head: Normocephalic.     Right Ear: There is no impacted cerumen.     Left Ear: There is no impacted cerumen.     Nose: Nose normal.     Mouth/Throat:     Mouth: Mucous membranes are moist.  Eyes:     General:        Right eye: No discharge.        Left eye: No discharge.  Cardiovascular:     Rate and Rhythm: Normal rate and regular rhythm.     Pulses:          Dorsalis pedis pulses are 1+ on the right side and 1+ on the left side.       Posterior tibial pulses are 1+ on the right side and 1+ on the left side.     Heart sounds: Normal heart sounds.  Pulmonary:     Effort: Pulmonary effort is normal. No respiratory distress.     Breath sounds: Normal breath sounds. No wheezing.  Abdominal:     General: Bowel sounds are normal. There is no distension.     Palpations: Abdomen is soft.     Tenderness: There is no abdominal tenderness.  Musculoskeletal:     Cervical back: Neck supple.     Right lower leg: No edema.     Left lower leg: No edema.  Feet:     Right foot:     Skin integrity: Skin integrity normal.     Toenail Condition: Fungal disease present.    Left foot:     Skin integrity: Skin integrity normal.     Toenail Condition: Fungal disease present. Skin:    General: Skin is warm.     Capillary Refill: Capillary refill takes less than 2 seconds.  Neurological:     General: No focal deficit present.      Mental Status: She is alert. Mental  status is at baseline.     Motor: Weakness present.     Gait: Gait abnormal.     Comments: wheelchair  Psychiatric:        Mood and Affect: Mood normal.     Comments: Less talkative, follows some commands, alert to self and familiar face     Labs reviewed: Recent Labs    11/05/21 0000 04/23/22 0000 05/18/22 0000  NA 138 137 142  K 4.2 4.4 4.4  CL 101 101 105  CO2 31* 26* 28*  BUN 23* 22* 23*  CREATININE 0.7 0.7 0.7  CALCIUM 9.4 9.2 8.7   Recent Labs    11/05/21 0000 04/23/22 0000 05/18/22 0000  AST 14 14 11*  ALT 13 10 13   ALKPHOS 96 95 100  ALBUMIN 3.9 3.8 3.4*   Recent Labs    11/05/21 0000 04/23/22 0000 05/18/22 0000  WBC 8.5 10.3 9.8  NEUTROABS  --  6,489.00  --   HGB 13.0 13.4 12.4  HCT 40 41 38  PLT 257 280 259   Lab Results  Component Value Date   TSH 2.18 07/02/2022   Lab Results  Component Value Date   HGBA1C 7.2 05/18/2022   Lab Results  Component Value Date   CHOL 158 05/18/2022   HDL 37 05/18/2022   LDLCALC 93 05/18/2022   TRIG 187 (A) 05/18/2022   CHOLHDL 3.0 12/13/2019    Significant Diagnostic Results in last 30 days:  No results found.  Assessment/Plan 1. Pneumonia of right lower lobe due to infectious organism - 07/02 CXR infiltrate to RLL - completed doxycycline x 10 days - still coughing, off oxygen - start mucinex 600 mg po BID x 5 days  2. Type 2 diabetes mellitus with hyperglycemia, without long-term current use of insulin (HCC) - A1c stable - no hypoglycemias - diet controlled - foot exam done today  3. Acquired hypothyroidism - TSH stable - cont levothyroxine  4. Dementia with behavioral disturbance (HCC) - no recent behaviors - talking less - weights stable - ambulates with w/c  - off ativan  - cont depakote and seroquel  5. Recurrent depression (HCC) - no mood changes - Na+ stable - cont Zoloft  6. Anxiety - no recent panic  - off ativan  7. Primary  hypertension - controlled with metoprolol    Family/ staff Communication: plan discussed with patient, daughter and nurse  Labs/tests ordered:  none

## 2022-09-14 DIAGNOSIS — Z515 Encounter for palliative care: Secondary | ICD-10-CM | POA: Diagnosis not present

## 2022-09-14 DIAGNOSIS — R296 Repeated falls: Secondary | ICD-10-CM | POA: Diagnosis not present

## 2022-09-14 DIAGNOSIS — D649 Anemia, unspecified: Secondary | ICD-10-CM | POA: Diagnosis not present

## 2022-09-14 DIAGNOSIS — F413 Other mixed anxiety disorders: Secondary | ICD-10-CM | POA: Diagnosis not present

## 2022-09-14 DIAGNOSIS — F01518 Vascular dementia, unspecified severity, with other behavioral disturbance: Secondary | ICD-10-CM | POA: Diagnosis not present

## 2022-09-14 DIAGNOSIS — J690 Pneumonitis due to inhalation of food and vomit: Secondary | ICD-10-CM | POA: Diagnosis not present

## 2022-09-14 DIAGNOSIS — I10 Essential (primary) hypertension: Secondary | ICD-10-CM | POA: Diagnosis not present

## 2022-09-14 DIAGNOSIS — E1159 Type 2 diabetes mellitus with other circulatory complications: Secondary | ICD-10-CM | POA: Diagnosis not present

## 2022-09-14 DIAGNOSIS — E038 Other specified hypothyroidism: Secondary | ICD-10-CM | POA: Diagnosis not present

## 2022-09-16 DIAGNOSIS — F0153 Vascular dementia, unspecified severity, with mood disturbance: Secondary | ICD-10-CM | POA: Diagnosis not present

## 2022-09-16 DIAGNOSIS — E119 Type 2 diabetes mellitus without complications: Secondary | ICD-10-CM | POA: Diagnosis not present

## 2022-09-16 DIAGNOSIS — E039 Hypothyroidism, unspecified: Secondary | ICD-10-CM | POA: Diagnosis not present

## 2022-09-16 DIAGNOSIS — J690 Pneumonitis due to inhalation of food and vomit: Secondary | ICD-10-CM | POA: Diagnosis not present

## 2022-09-16 DIAGNOSIS — I10 Essential (primary) hypertension: Secondary | ICD-10-CM | POA: Diagnosis not present

## 2022-09-16 DIAGNOSIS — I69818 Other symptoms and signs involving cognitive functions following other cerebrovascular disease: Secondary | ICD-10-CM | POA: Diagnosis not present

## 2022-09-16 DIAGNOSIS — Z8781 Personal history of (healed) traumatic fracture: Secondary | ICD-10-CM | POA: Diagnosis not present

## 2022-09-17 DIAGNOSIS — Z8781 Personal history of (healed) traumatic fracture: Secondary | ICD-10-CM | POA: Diagnosis not present

## 2022-09-17 DIAGNOSIS — F0153 Vascular dementia, unspecified severity, with mood disturbance: Secondary | ICD-10-CM | POA: Diagnosis not present

## 2022-09-17 DIAGNOSIS — I10 Essential (primary) hypertension: Secondary | ICD-10-CM | POA: Diagnosis not present

## 2022-09-17 DIAGNOSIS — I69818 Other symptoms and signs involving cognitive functions following other cerebrovascular disease: Secondary | ICD-10-CM | POA: Diagnosis not present

## 2022-09-17 DIAGNOSIS — J690 Pneumonitis due to inhalation of food and vomit: Secondary | ICD-10-CM | POA: Diagnosis not present

## 2022-09-17 DIAGNOSIS — E119 Type 2 diabetes mellitus without complications: Secondary | ICD-10-CM | POA: Diagnosis not present

## 2022-09-20 DIAGNOSIS — I10 Essential (primary) hypertension: Secondary | ICD-10-CM | POA: Diagnosis not present

## 2022-09-20 DIAGNOSIS — I69818 Other symptoms and signs involving cognitive functions following other cerebrovascular disease: Secondary | ICD-10-CM | POA: Diagnosis not present

## 2022-09-20 DIAGNOSIS — Z8781 Personal history of (healed) traumatic fracture: Secondary | ICD-10-CM | POA: Diagnosis not present

## 2022-09-20 DIAGNOSIS — F0153 Vascular dementia, unspecified severity, with mood disturbance: Secondary | ICD-10-CM | POA: Diagnosis not present

## 2022-09-20 DIAGNOSIS — E119 Type 2 diabetes mellitus without complications: Secondary | ICD-10-CM | POA: Diagnosis not present

## 2022-09-20 DIAGNOSIS — J690 Pneumonitis due to inhalation of food and vomit: Secondary | ICD-10-CM | POA: Diagnosis not present

## 2022-09-24 DIAGNOSIS — E119 Type 2 diabetes mellitus without complications: Secondary | ICD-10-CM | POA: Diagnosis not present

## 2022-09-24 DIAGNOSIS — I69818 Other symptoms and signs involving cognitive functions following other cerebrovascular disease: Secondary | ICD-10-CM | POA: Diagnosis not present

## 2022-09-24 DIAGNOSIS — J690 Pneumonitis due to inhalation of food and vomit: Secondary | ICD-10-CM | POA: Diagnosis not present

## 2022-09-24 DIAGNOSIS — I10 Essential (primary) hypertension: Secondary | ICD-10-CM | POA: Diagnosis not present

## 2022-09-24 DIAGNOSIS — F0153 Vascular dementia, unspecified severity, with mood disturbance: Secondary | ICD-10-CM | POA: Diagnosis not present

## 2022-09-24 DIAGNOSIS — Z8781 Personal history of (healed) traumatic fracture: Secondary | ICD-10-CM | POA: Diagnosis not present

## 2022-09-27 DIAGNOSIS — Z8781 Personal history of (healed) traumatic fracture: Secondary | ICD-10-CM | POA: Diagnosis not present

## 2022-09-27 DIAGNOSIS — F0153 Vascular dementia, unspecified severity, with mood disturbance: Secondary | ICD-10-CM | POA: Diagnosis not present

## 2022-09-27 DIAGNOSIS — J690 Pneumonitis due to inhalation of food and vomit: Secondary | ICD-10-CM | POA: Diagnosis not present

## 2022-09-27 DIAGNOSIS — I10 Essential (primary) hypertension: Secondary | ICD-10-CM | POA: Diagnosis not present

## 2022-09-27 DIAGNOSIS — I69818 Other symptoms and signs involving cognitive functions following other cerebrovascular disease: Secondary | ICD-10-CM | POA: Diagnosis not present

## 2022-09-27 DIAGNOSIS — E119 Type 2 diabetes mellitus without complications: Secondary | ICD-10-CM | POA: Diagnosis not present

## 2022-09-28 DIAGNOSIS — J690 Pneumonitis due to inhalation of food and vomit: Secondary | ICD-10-CM | POA: Diagnosis not present

## 2022-09-28 DIAGNOSIS — I10 Essential (primary) hypertension: Secondary | ICD-10-CM | POA: Diagnosis not present

## 2022-09-28 DIAGNOSIS — I69818 Other symptoms and signs involving cognitive functions following other cerebrovascular disease: Secondary | ICD-10-CM | POA: Diagnosis not present

## 2022-09-28 DIAGNOSIS — Z8781 Personal history of (healed) traumatic fracture: Secondary | ICD-10-CM | POA: Diagnosis not present

## 2022-09-28 DIAGNOSIS — F0153 Vascular dementia, unspecified severity, with mood disturbance: Secondary | ICD-10-CM | POA: Diagnosis not present

## 2022-09-28 DIAGNOSIS — E119 Type 2 diabetes mellitus without complications: Secondary | ICD-10-CM | POA: Diagnosis not present

## 2022-09-30 DIAGNOSIS — I69818 Other symptoms and signs involving cognitive functions following other cerebrovascular disease: Secondary | ICD-10-CM | POA: Diagnosis not present

## 2022-09-30 DIAGNOSIS — E119 Type 2 diabetes mellitus without complications: Secondary | ICD-10-CM | POA: Diagnosis not present

## 2022-09-30 DIAGNOSIS — E039 Hypothyroidism, unspecified: Secondary | ICD-10-CM | POA: Diagnosis not present

## 2022-09-30 DIAGNOSIS — Z8781 Personal history of (healed) traumatic fracture: Secondary | ICD-10-CM | POA: Diagnosis not present

## 2022-09-30 DIAGNOSIS — J690 Pneumonitis due to inhalation of food and vomit: Secondary | ICD-10-CM | POA: Diagnosis not present

## 2022-09-30 DIAGNOSIS — F0153 Vascular dementia, unspecified severity, with mood disturbance: Secondary | ICD-10-CM | POA: Diagnosis not present

## 2022-09-30 DIAGNOSIS — I10 Essential (primary) hypertension: Secondary | ICD-10-CM | POA: Diagnosis not present

## 2022-10-07 DIAGNOSIS — I10 Essential (primary) hypertension: Secondary | ICD-10-CM | POA: Diagnosis not present

## 2022-10-07 DIAGNOSIS — F0153 Vascular dementia, unspecified severity, with mood disturbance: Secondary | ICD-10-CM | POA: Diagnosis not present

## 2022-10-07 DIAGNOSIS — I69818 Other symptoms and signs involving cognitive functions following other cerebrovascular disease: Secondary | ICD-10-CM | POA: Diagnosis not present

## 2022-10-07 DIAGNOSIS — E119 Type 2 diabetes mellitus without complications: Secondary | ICD-10-CM | POA: Diagnosis not present

## 2022-10-07 DIAGNOSIS — Z8781 Personal history of (healed) traumatic fracture: Secondary | ICD-10-CM | POA: Diagnosis not present

## 2022-10-07 DIAGNOSIS — J690 Pneumonitis due to inhalation of food and vomit: Secondary | ICD-10-CM | POA: Diagnosis not present

## 2022-10-08 DIAGNOSIS — I69818 Other symptoms and signs involving cognitive functions following other cerebrovascular disease: Secondary | ICD-10-CM | POA: Diagnosis not present

## 2022-10-08 DIAGNOSIS — E119 Type 2 diabetes mellitus without complications: Secondary | ICD-10-CM | POA: Diagnosis not present

## 2022-10-08 DIAGNOSIS — F0153 Vascular dementia, unspecified severity, with mood disturbance: Secondary | ICD-10-CM | POA: Diagnosis not present

## 2022-10-08 DIAGNOSIS — J690 Pneumonitis due to inhalation of food and vomit: Secondary | ICD-10-CM | POA: Diagnosis not present

## 2022-10-08 DIAGNOSIS — Z8781 Personal history of (healed) traumatic fracture: Secondary | ICD-10-CM | POA: Diagnosis not present

## 2022-10-08 DIAGNOSIS — I10 Essential (primary) hypertension: Secondary | ICD-10-CM | POA: Diagnosis not present

## 2022-10-15 DIAGNOSIS — Z8781 Personal history of (healed) traumatic fracture: Secondary | ICD-10-CM | POA: Diagnosis not present

## 2022-10-15 DIAGNOSIS — I10 Essential (primary) hypertension: Secondary | ICD-10-CM | POA: Diagnosis not present

## 2022-10-15 DIAGNOSIS — E119 Type 2 diabetes mellitus without complications: Secondary | ICD-10-CM | POA: Diagnosis not present

## 2022-10-15 DIAGNOSIS — J690 Pneumonitis due to inhalation of food and vomit: Secondary | ICD-10-CM | POA: Diagnosis not present

## 2022-10-15 DIAGNOSIS — F0153 Vascular dementia, unspecified severity, with mood disturbance: Secondary | ICD-10-CM | POA: Diagnosis not present

## 2022-10-15 DIAGNOSIS — I69818 Other symptoms and signs involving cognitive functions following other cerebrovascular disease: Secondary | ICD-10-CM | POA: Diagnosis not present

## 2022-10-18 DIAGNOSIS — I69818 Other symptoms and signs involving cognitive functions following other cerebrovascular disease: Secondary | ICD-10-CM | POA: Diagnosis not present

## 2022-10-18 DIAGNOSIS — I10 Essential (primary) hypertension: Secondary | ICD-10-CM | POA: Diagnosis not present

## 2022-10-18 DIAGNOSIS — F0153 Vascular dementia, unspecified severity, with mood disturbance: Secondary | ICD-10-CM | POA: Diagnosis not present

## 2022-10-18 DIAGNOSIS — Z8781 Personal history of (healed) traumatic fracture: Secondary | ICD-10-CM | POA: Diagnosis not present

## 2022-10-18 DIAGNOSIS — E119 Type 2 diabetes mellitus without complications: Secondary | ICD-10-CM | POA: Diagnosis not present

## 2022-10-18 DIAGNOSIS — J690 Pneumonitis due to inhalation of food and vomit: Secondary | ICD-10-CM | POA: Diagnosis not present

## 2022-10-22 ENCOUNTER — Encounter: Payer: Self-pay | Admitting: Orthopedic Surgery

## 2022-10-22 ENCOUNTER — Non-Acute Institutional Stay (SKILLED_NURSING_FACILITY): Payer: Medicare Other | Admitting: Orthopedic Surgery

## 2022-10-22 DIAGNOSIS — L209 Atopic dermatitis, unspecified: Secondary | ICD-10-CM

## 2022-10-22 DIAGNOSIS — F03918 Unspecified dementia, unspecified severity, with other behavioral disturbance: Secondary | ICD-10-CM | POA: Diagnosis not present

## 2022-10-22 DIAGNOSIS — I10 Essential (primary) hypertension: Secondary | ICD-10-CM | POA: Diagnosis not present

## 2022-10-22 DIAGNOSIS — E039 Hypothyroidism, unspecified: Secondary | ICD-10-CM | POA: Diagnosis not present

## 2022-10-22 DIAGNOSIS — F418 Other specified anxiety disorders: Secondary | ICD-10-CM

## 2022-10-22 DIAGNOSIS — E1165 Type 2 diabetes mellitus with hyperglycemia: Secondary | ICD-10-CM | POA: Diagnosis not present

## 2022-10-22 NOTE — Progress Notes (Unsigned)
Location:  [facility] Room Number: 39/A Place of Service:  SNF [phone]) Provider:  Octavia Heir, NP   Mahlon Gammon, MD  Patient Care Team: Mahlon Gammon, MD as PCP - General (Internal Medicine)  Extended Emergency Contact Information Primary Emergency Contact: Harris,Margaret Address: [address]          [place], [place] [address] [place] of [place] Home Phone: [phone] Mobile Phone: [phone] Relation: Daughter  Code Status:  DNR Goals of care: Advanced Directive information    09/10/2022    9:12 AM  Advanced Directives  Does Patient Have a Medical Advance Directive? Yes  Type of Estate agent of [place];Living will;Out of facility DNR (pink MOST or yellow form)  Does patient want to make changes to medical advance directive? No - Patient declined  Copy of Healthcare Power of Attorney in Chart? Yes - validated most recent copy scanned in chart (See row information)     Chief Complaint  Patient presents with   Medical Management of Chronic Issues    HPI:  Pt is a 89 y.o. female seen today for medical management of chronic diseases.    She currently resides on the skilled nursing unit at [facility]. Past medical includes: HTN, constipation, T2DM, hypothyroidism, OA, urinary retention, vascular dementia, depression and anxiety.    Dementia- followed by hospice> enrolled 08/2022, CT head 04/2020 age related atrophy and chronic small vessel ischemia, no behaviors, dependent with ADLs except feeding, remains on Depakote and Seroquel  T2DM- A1c 7.2 (03/19)> was 6.5 (12/22), blood sugars averaging 130-170's, no hypoglycemic events, diet controlled Hypothyroidism- TSH 2.18 07/02/2022, remains on levothyroxine Depression/anxiety- good family support, remains on Zoloft, Na+ 137 08/31/2022, off Ativan at this time  HTN- BUN/creat 16/0.7 08/31/2022, remains on metoprolol  No recent falls or injuries.   Recent blood  pressures:  08/20- 140/84, 123/75  08/13- 146/89  Recent weights:  08/01- 148 lbs  07/01- 156 lbs  06/03- 154 lbs  Past Medical History:  Diagnosis Date   Burst fracture of lumbar vertebra, sequela 12/27/2019   Closed left hip fracture (HCC) 04/07/2020   Diabetes mellitus type 2 in obese 11/09/2017   12/13/19 Hgb a1c 6.8, Na 141, K 4.3, Bun 16, creat 0.78, eGFR 67, wbc 9.6, Hgb 12.6, plt 318, neutrophils 60.9%, LDL 51  01/10/20 wbc 14.3, Hgb 13.6, plt 523, neutrophils 70.5%, Na 142, K 3.3, Bun 27, creat 0.62, eGFR 80  01/31/20 eye exam when the patient is able.   01/08/21 Hgb a1c 5.5   Fall as cause of accidental injury in residential institution as place of occurrence 04/07/2020   Hearing loss    History of fracture of clavicle    Hypokalemia 01/21/2020   01/21/20 Na 141, K 4.1, Bun 12, creat 0.65, eGFR 79, wbc 9.2, Hgb 12.7, plt 331   Hypothyroidism    Insomnia    Right lower lobe pneumonia 01/14/2020   Tachycardia 04/03/2019   Tinnitus    Unspecified fracture of the lower end of left radius, initial encounter for closed fracture 04/07/2020   Past Surgical History:  Procedure Laterality Date   CHOLECYSTECTOMY  2007   Dr. Alan Mulder   CLAVICLE EXCISION  1986   INTRAMEDULLARY (IM) NAIL INTERTROCHANTERIC Left 04/09/2020   Procedure: LEFT INTERTROCHANTERIC INTRAMEDULLARY (IM) NAIL;  Surgeon: Tarry Kos, MD;  Location: [facility] OR;  Service: Orthopedics;  Laterality: Left;   MOHS SURGERY     past 10 years chest & legs  OPEN REDUCTION INTERNAL FIXATION (ORIF) DISTAL RADIAL FRACTURE Left 04/09/2020   Procedure: CLOSED REDUCTION LEFT DISTAL RADIUS FRACTURE;  Surgeon: Tarry Kos, MD;  Location: [facility] OR;  Service: Orthopedics;  Laterality: Left;    Allergies  Allergen Reactions   Metformin And Related Diarrhea    Outpatient Encounter Medications as of 10/22/2022  Medication Sig   acetaminophen (TYLENOL) 325 MG tablet Take 650 mg by mouth 3 (three) times daily as needed.   albuterol  (PROVENTIL) (2.5 MG/3ML) 0.083% nebulizer solution Take 3 mLs (2.5 mg total) by nebulization 2 (two) times daily for 3 days.   Cholecalciferol (D3) 50 MCG (2000 UT) TABS Take by mouth.   DIMETHICONE, TOPICAL, (CERAVE BABY) 1 % LOTN Apply 1 Application topically daily. Apply to shoulders, back, and chest.   divalproex (DEPAKOTE) 125 MG DR tablet Take 125 mg by mouth daily.   docusate sodium (COLACE) 100 MG capsule Take 100 mg by mouth daily.   Emollient (CERAVE) LOTN Apply 1 Application topically as directed. Apply to back and chest topically every shift every 2 days for dry skin.   Emollient (CERAVE) LOTN Apply 1 Application topically daily. Apply to back and chest.   Glucerna (GLUCERNA) LIQD Take 240 mLs by mouth as needed.   hydrocortisone cream 1 % Apply 1 application  topically as needed. Apply to back, abdomen, and chest   ipratropium-albuterol (DUONEB) 0.5-2.5 (3) MG/3ML SOLN Take 3 mLs by nebulization every 6 (six) hours as needed.   levothyroxine (SYNTHROID) 75 MCG tablet TAKE 1 TABLET (75 MCG TOTAL) BY MOUTH DAILY BEFORE BREAKFAST.   metoprolol tartrate (LOPRESSOR) 25 MG tablet Take 12.5 mg by mouth 2 (two) times daily.   nystatin powder Apply 1 Application topically 3 (three) times daily.   ondansetron (ZOFRAN) 4 MG tablet Take 1 tablet (4 mg total) by mouth 2 (two) times daily as needed for nausea or vomiting.   polyethylene glycol (MIRALAX / GLYCOLAX) 17 g packet Take 17 g by mouth daily as needed for moderate constipation.   QUEtiapine (SEROQUEL) 25 MG tablet Take 37.5 mg by mouth daily at 2 PM. Hold 09/13- 09/18 while on paxlovid   sertraline (ZOLOFT) 50 MG tablet Take 75 mg by mouth daily.   Sodium Fluoride (PREVIDENT 5000 BOOSTER PLUS) 1.1 % PSTE Place 1 Application onto teeth in the morning and at bedtime.   zinc oxide 20 % ointment Apply 1 application topically as needed for irritation.   No facility-administered encounter medications on file as of 10/22/2022.    Review of  Systems  Unable to perform ROS: Dementia    Immunization History  Administered Date(s) Administered   DTaP 07/20/2013   Fluad Quad(high Dose 65+) 12/23/2021   Influenza, High Dose Seasonal PF 12/07/2016   Influenza,inj,Quad PF,6+ Mos 12/01/2017   Influenza-Unspecified 11/20/2014, 12/12/2015, 11/20/2019   Moderna SARS-COV2 Booster Vaccination 08/06/2020   Moderna Sars-Covid-2 Vaccination 03/05/2019, 04/02/2019   PFIZER(Purple Top)SARS-COV-2 Vaccination 04/01/2020, 08/06/2020, 11/19/2020, 01/05/2022   PPD Test 08/06/2014   Pneumococcal Conjugate-13 11/07/2013   Pneumococcal Polysaccharide-23 03/22/2017   Tetanus 03/02/2015   Zoster Recombinant(Shingrix) 06/07/2017, 08/26/2017   Zoster, Live 03/01/2006   Pertinent  Health Maintenance Due  Topic Date Due   INFLUENZA VACCINE  09/30/2022   HEMOGLOBIN A1C  11/18/2022   OPHTHALMOLOGY EXAM  11/19/2022   FOOT EXAM  09/10/2023   DEXA SCAN  Completed      04/14/2020    7:47 AM 09/12/2020   12:07 PM 09/28/2021   10:30 AM 05/19/2022  12:28 PM 09/10/2022    2:05 PM  Fall Risk  Falls in the past year?  1 1 1  0  Was there an injury with Fall?  1 0 0 0  Fall Risk Category Calculator  3 2 1  0  Fall Risk Category (Retired)  High Moderate    (RETIRED) Patient Fall Risk Level High fall risk  Moderate fall risk    Patient at Risk for Falls Due to  History of fall(s);Impaired balance/gait;Impaired mobility;Mental status change History of fall(s);Impaired balance/gait;Impaired mobility History of fall(s);Impaired balance/gait;Impaired mobility History of fall(s);Impaired balance/gait;Impaired mobility  Fall risk Follow up  Falls evaluation completed;Education provided;Falls prevention discussed Falls evaluation completed;Education provided;Falls prevention discussed Falls evaluation completed;Education provided;Falls prevention discussed Falls evaluation completed;Education provided;Falls prevention discussed   Functional Status Survey:    Vitals:    10/22/22 1645 10/22/22 1646  BP: (!) 140/84   Pulse: 100 84  Resp: 20   Temp: (!) 96.4 F (35.8 C)   SpO2: 98%   Weight: 150 lb 3.2 oz (68.1 kg)   Height: 5' (1.524 m)    Body mass index is 29.33 kg/m. Physical Exam Vitals reviewed.  Constitutional:      General: She is not in acute distress. HENT:     Head: Normocephalic.     Right Ear: There is no impacted cerumen.     Left Ear: There is no impacted cerumen.     Nose: Nose normal.     Mouth/Throat:     Mouth: Mucous membranes are moist.  Eyes:     General:        Right eye: No discharge.        Left eye: No discharge.  Cardiovascular:     Rate and Rhythm: Normal rate and regular rhythm.     Pulses: Normal pulses.     Heart sounds: Normal heart sounds.  Pulmonary:     Effort: Pulmonary effort is normal. No respiratory distress.     Breath sounds: Normal breath sounds. No wheezing or rales.  Abdominal:     General: Bowel sounds are normal.     Palpations: Abdomen is soft.  Musculoskeletal:     Cervical back: Neck supple.     Right lower leg: No edema.     Left lower leg: No edema.  Skin:    General: Skin is warm and dry.     Capillary Refill: Capillary refill takes less than 2 seconds.     Findings: Rash present.     Comments: Scattered macules to upper back extending down to buttocks, no skin breakdown  Neurological:     General: No focal deficit present.     Mental Status: She is alert. Mental status is at baseline.     Motor: Weakness present.     Gait: Gait abnormal.  Psychiatric:        Mood and Affect: Mood normal.     Labs reviewed: Recent Labs    04/23/22 0000 05/18/22 0000 08/31/22 0000  NA 137 142 137  K 4.4 4.4 3.9  CL 101 105 102  CO2 26* 28* 27*  BUN 22* 23* 16  CREATININE 0.7 0.7 0.7  CALCIUM 9.2 8.7 9.1   Recent Labs    11/05/21 0000 04/23/22 0000 05/18/22 0000  AST 14 14 11*  ALT 13 10 13   ALKPHOS 96 95 100  ALBUMIN 3.9 3.8 3.4*   Recent Labs    04/23/22 0000  05/18/22 0000 08/31/22 0000  WBC 10.3 9.8 9.6  NEUTROABS 6,489.00  --  7,085.00  HGB 13.4 12.4 12.6  HCT 41 38 39  PLT 280 259 262   Lab Results  Component Value Date   TSH 2.18 07/02/2022   Lab Results  Component Value Date   HGBA1C 7.2 05/18/2022   Lab Results  Component Value Date   CHOL 158 05/18/2022   HDL 37 05/18/2022   LDLCALC 93 05/18/2022   TRIG 187 (A) 05/18/2022   CHOLHDL 3.0 12/13/2019    Significant Diagnostic Results in last 30 days:  No results found.  Assessment/Plan 1. Atopic dermatitis, unspecified type - involves upper back extending down to buttocks - suspect heat/moisture as cause - start prednisone 20 mg daily x 7 days - start hydrocortisone 1% cream to back BID  2. Dementia with behavioral disturbance (HCC) - enrolled in hospice 08/2022 - no behaviors - mild weight loss - dependent with ADLs except feeding - cont Depakote and Seroquel  3. Type 2 diabetes mellitus with hyperglycemia, without long-term current use of insulin (HCC) - A1c stable - no hypoglycemias> sugars 130-170's - diet controlled  4. Acquired hypothyroidism - TSH stable - cont levothyroxine  5. Depression with anxiety - stable mood - supportive family - cont Zoloft  6. Primary hypertension - controlled with metoprolol    Family/ staff Communication: plan discussed with patient and nurse  Labs/tests ordered:  none

## 2022-10-25 ENCOUNTER — Encounter: Payer: Self-pay | Admitting: Orthopedic Surgery

## 2022-10-25 ENCOUNTER — Non-Acute Institutional Stay (SKILLED_NURSING_FACILITY): Payer: Medicare Other | Admitting: Orthopedic Surgery

## 2022-10-25 DIAGNOSIS — L209 Atopic dermatitis, unspecified: Secondary | ICD-10-CM | POA: Diagnosis not present

## 2022-10-25 DIAGNOSIS — F03918 Unspecified dementia, unspecified severity, with other behavioral disturbance: Secondary | ICD-10-CM | POA: Diagnosis not present

## 2022-10-25 DIAGNOSIS — L03012 Cellulitis of left finger: Secondary | ICD-10-CM

## 2022-10-25 MED ORDER — PREDNISONE 20 MG PO TABS
20.0000 mg | ORAL_TABLET | Freq: Every day | ORAL | Status: AC
Start: 2022-10-22 — End: 2022-10-29

## 2022-10-25 MED ORDER — DOXYCYCLINE HYCLATE 100 MG PO TABS
100.0000 mg | ORAL_TABLET | Freq: Two times a day (BID) | ORAL | Status: DC
Start: 2022-10-25 — End: 2022-10-29

## 2022-10-25 NOTE — Progress Notes (Unsigned)
Location:  [facility] Room Number: 39/A Place of Service:  SNF [phone]) Provider:  Octavia Heir, NP   Mahlon Gammon, MD  Patient Care Team: Mahlon Gammon, MD as PCP - General (Internal Medicine)  Extended Emergency Contact Information Primary Emergency Contact: Harris,Margaret Address: [address]          [place], [place] [address] D[place] of [place] Home Phone: [phone] Mobile Phone: [phone] Relation: Daughter  Code Status:  DNR Goals of care: Advanced Directive information    09/10/2022    9:12 AM  Advanced Directives  Does Patient Have a Medical Advance Directive? Yes  Type of Estate agent of [place];Living will;Out of facility DNR (pink MOST or yellow form)  Does patient want to make changes to medical advance directive? No - Patient declined  Copy of Healthcare Power of Attorney in Chart? Yes - validated most recent copy scanned in chart (See row information)     Chief Complaint  Patient presents with   Acute Visit    Infection left pointer finger    HPI:  Pt is a 89 y.o. female seen today for acute visit due to cellulitis of left pointer finger.   She currently resides on the skilled nursing unit at [facility]. Past medical includes: HTN, constipation, T2DM, hypothyroidism, OA, urinary retention, vascular dementia, depression and anxiety.    Nursing reports increased redness, tenderness and swelling to left pointer finger. Symptoms began 08/25, first noticed by family. No reported injury per chart review. She is a poor historian due to advanced dementia. She is able to move finger, but confirms pain. Afebrile. Vitals stable.   08/23 rash to back and buttocks noted, prescribed oral prednisone x 7 days and hydrocortisone cream. She denies itching.       Past Medical History:  Diagnosis Date   Burst fracture of lumbar vertebra, sequela 12/27/2019   Closed left hip fracture (HCC) 04/07/2020   Diabetes  mellitus type 2 in obese 11/09/2017   12/13/19 Hgb a1c 6.8, Na 141, K 4.3, Bun 16, creat 0.78, eGFR 67, wbc 9.6, Hgb 12.6, plt 318, neutrophils 60.9%, LDL 51  01/10/20 wbc 14.3, Hgb 13.6, plt 523, neutrophils 70.5%, Na 142, K 3.3, Bun 27, creat 0.62, eGFR 80  01/31/20 eye exam when the patient is able.   01/08/21 Hgb a1c 5.5   Fall as cause of accidental injury in residential institution as place of occurrence 04/07/2020   Hearing loss    History of fracture of clavicle    Hypokalemia 01/21/2020   01/21/20 Na 141, K 4.1, Bun 12, creat 0.65, eGFR 79, wbc 9.2, Hgb 12.7, plt 331   Hypothyroidism    Insomnia    Right lower lobe pneumonia 01/14/2020   Tachycardia 04/03/2019   Tinnitus    Unspecified fracture of the lower end of left radius, initial encounter for closed fracture 04/07/2020   Past Surgical History:  Procedure Laterality Date   CHOLECYSTECTOMY  2007   Dr. Alan Mulder   CLAVICLE EXCISION  1986   INTRAMEDULLARY (IM) NAIL INTERTROCHANTERIC Left 04/09/2020   Procedure: LEFT INTERTROCHANTERIC INTRAMEDULLARY (IM) NAIL;  Surgeon: Tarry Kos, MD;  Location: [facility] OR;  Service: Orthopedics;  Laterality: Left;   MOHS SURGERY     past 10 years chest & legs   OPEN REDUCTION INTERNAL FIXATION (ORIF) DISTAL RADIAL FRACTURE Left 04/09/2020   Procedure: CLOSED REDUCTION LEFT DISTAL RADIUS FRACTURE;  Surgeon: Tarry Kos, MD;  Location: [facility] OR;  Service: Orthopedics;  Laterality: Left;    Allergies  Allergen Reactions   Metformin And Related Diarrhea    Outpatient Encounter Medications as of 10/25/2022  Medication Sig   acetaminophen (TYLENOL) 325 MG tablet Take 650 mg by mouth 3 (three) times daily as needed.   albuterol (PROVENTIL) (2.5 MG/3ML) 0.083% nebulizer solution Take 3 mLs (2.5 mg total) by nebulization 2 (two) times daily for 3 days.   Cholecalciferol (D3) 50 MCG (2000 UT) TABS Take by mouth.   DIMETHICONE, TOPICAL, (CERAVE BABY) 1 % LOTN Apply 1 Application topically daily. Apply to  shoulders, back, and chest.   divalproex (DEPAKOTE) 125 MG DR tablet Take 125 mg by mouth daily.   docusate sodium (COLACE) 100 MG capsule Take 100 mg by mouth daily.   Emollient (CERAVE) LOTN Apply 1 Application topically as directed. Apply to back and chest topically every shift every 2 days for dry skin.   Emollient (CERAVE) LOTN Apply 1 Application topically daily. Apply to back and chest.   Glucerna (GLUCERNA) LIQD Take 240 mLs by mouth as needed.   hydrocortisone cream 1 % Apply 1 application  topically 2 (two) times daily. Apply to back and buttocks   ipratropium-albuterol (DUONEB) 0.5-2.5 (3) MG/3ML SOLN Take 3 mLs by nebulization every 6 (six) hours as needed.   levothyroxine (SYNTHROID) 75 MCG tablet TAKE 1 TABLET (75 MCG TOTAL) BY MOUTH DAILY BEFORE BREAKFAST.   metoprolol tartrate (LOPRESSOR) 25 MG tablet Take 12.5 mg by mouth 2 (two) times daily.   nystatin powder Apply 1 Application topically 3 (three) times daily.   ondansetron (ZOFRAN) 4 MG tablet Take 1 tablet (4 mg total) by mouth 2 (two) times daily as needed for nausea or vomiting.   polyethylene glycol (MIRALAX / GLYCOLAX) 17 g packet Take 17 g by mouth daily as needed for moderate constipation.   predniSONE (DELTASONE) 20 MG tablet Take 1 tablet (20 mg total) by mouth daily with breakfast for 7 days.   QUEtiapine (SEROQUEL) 25 MG tablet Take 37.5 mg by mouth daily at 2 PM. Hold 09/13- 09/18 while on paxlovid   sertraline (ZOLOFT) 50 MG tablet Take 75 mg by mouth daily.   Sodium Fluoride (PREVIDENT 5000 BOOSTER PLUS) 1.1 % PSTE Place 1 Application onto teeth in the morning and at bedtime.   zinc oxide 20 % ointment Apply 1 application topically as needed for irritation.   No facility-administered encounter medications on file as of 10/25/2022.    Review of Systems  Unable to perform ROS: Dementia    Immunization History  Administered Date(s) Administered   DTaP 07/20/2013   Fluad Quad(high Dose 65+) 12/23/2021    Influenza, High Dose Seasonal PF 12/07/2016   Influenza,inj,Quad PF,6+ Mos 12/01/2017   Influenza-Unspecified 11/20/2014, 12/12/2015, 11/20/2019   Moderna SARS-COV2 Booster Vaccination 08/06/2020   Moderna Sars-Covid-2 Vaccination 03/05/2019, 04/02/2019   PFIZER(Purple Top)SARS-COV-2 Vaccination 04/01/2020, 08/06/2020, 11/19/2020, 01/05/2022   PPD Test 08/06/2014   Pneumococcal Conjugate-13 11/07/2013   Pneumococcal Polysaccharide-23 03/22/2017   Tetanus 03/02/2015   Zoster Recombinant(Shingrix) 06/07/2017, 08/26/2017   Zoster, Live 03/01/2006   Pertinent  Health Maintenance Due  Topic Date Due   INFLUENZA VACCINE  09/30/2022   HEMOGLOBIN A1C  11/18/2022   OPHTHALMOLOGY EXAM  11/19/2022   FOOT EXAM  10/22/2023   DEXA SCAN  Completed      09/12/2020   12:07 PM 09/28/2021   10:30 AM 05/19/2022   12:28 PM 09/10/2022    2:05 PM 10/25/2022    9:27 AM  Fall Risk  Falls in the past year? 1 1 1  0 0  Was there an injury with Fall? 1 0 0 0 0  Fall Risk Category Calculator 3 2 1  0 0  Fall Risk Category (Retired) High Moderate     (RETIRED) Patient Fall Risk Level  Moderate fall risk     Patient at Risk for Falls Due to History of fall(s);Impaired balance/gait;Impaired mobility;Mental status change History of fall(s);Impaired balance/gait;Impaired mobility History of fall(s);Impaired balance/gait;Impaired mobility History of fall(s);Impaired balance/gait;Impaired mobility History of fall(s);Impaired balance/gait;Impaired mobility  Fall risk Follow up Falls evaluation completed;Education provided;Falls prevention discussed Falls evaluation completed;Education provided;Falls prevention discussed Falls evaluation completed;Education provided;Falls prevention discussed Falls evaluation completed;Education provided;Falls prevention discussed Falls evaluation completed;Education provided;Falls prevention discussed   Functional Status Survey:    Vitals:   10/25/22 1759  BP: (!) 140/84  Pulse: 95   Resp: 20  Temp: (!) 96.4 F (35.8 C)  SpO2: 98%  Weight: 150 lb 3.2 oz (68.1 kg)  Height: 5' (1.524 m)   Body mass index is 29.33 kg/m. Physical Exam Vitals reviewed.  Constitutional:      General: She is not in acute distress. HENT:     Head: Normocephalic.  Eyes:     General:        Right eye: No discharge.        Left eye: No discharge.  Cardiovascular:     Rate and Rhythm: Normal rate and regular rhythm.     Pulses: Normal pulses.     Heart sounds: Normal heart sounds.  Pulmonary:     Effort: Pulmonary effort is normal. No respiratory distress.     Breath sounds: Normal breath sounds. No wheezing.  Abdominal:     General: Bowel sounds are normal.     Palpations: Abdomen is soft.  Musculoskeletal:     Right hand: Swelling and tenderness present. Decreased range of motion. Normal strength. Normal capillary refill.     Cervical back: Neck supple.     Right lower leg: No edema.     Left lower leg: No edema.     Comments: Left hand 2nd digit with increased redness/swelling/tenderness around nailbed, no drainage or skin breakdown, she is able to move finger  Skin:    General: Skin is warm.     Capillary Refill: Capillary refill takes less than 2 seconds.     Comments: Decreased redness to back and buttocks, no scratch marks  Neurological:     General: No focal deficit present.     Mental Status: She is alert. Mental status is at baseline.     Motor: Weakness present.     Gait: Gait abnormal.     Comments: Hoyer, wheelchair  Psychiatric:        Mood and Affect: Mood normal.     Labs reviewed: Recent Labs    04/23/22 0000 05/18/22 0000 08/31/22 0000  NA 137 142 137  K 4.4 4.4 3.9  CL 101 105 102  CO2 26* 28* 27*  BUN 22* 23* 16  CREATININE 0.7 0.7 0.7  CALCIUM 9.2 8.7 9.1   Recent Labs    11/05/21 0000 04/23/22 0000 05/18/22 0000  AST 14 14 11*  ALT 13 10 13   ALKPHOS 96 95 100  ALBUMIN 3.9 3.8 3.4*   Recent Labs    04/23/22 0000 05/18/22 0000  08/31/22 0000  WBC 10.3 9.8 9.6  NEUTROABS 6,489.00  --  7,085.00  HGB 13.4 12.4 12.6  HCT 41 38 39  PLT 280 259 262   Lab Results  Component Value Date   TSH 2.18 07/02/2022   Lab Results  Component Value Date   HGBA1C 7.2 05/18/2022   Lab Results  Component Value Date   CHOL 158 05/18/2022   HDL 37 05/18/2022   LDLCALC 93 05/18/2022   TRIG 187 (A) 05/18/2022   CHOLHDL 3.0 12/13/2019    Significant Diagnostic Results in last 30 days:  No results found.  Assessment/Plan 1. Paronychia of finger, left - involves 2nd digit on left hand - no apparent injury - increased redness/tenderness/swelling to skin around nailbed - start epsom salt soaks 20 min BID x 5 days - doxycycline (VIBRA-TABS) 100 MG tablet; Take 1 tablet (100 mg total) by mouth 2 (two) times daily for 7 days.  2. Atopic dermatitis, unspecified type - improved - suspect associated with heat/moisture - cont prednisone x 7 days - cont hydrocortisone cream   3. Dementia with behavioral disturbance (HCC) - followed by hospice - no behaviors - dependent with ADLs except feeding - cont Depakote and Seroquel - cont skilled nursing     Family/ staff Communication: plan discussed with patient and nurse  Labs/tests ordered:  none

## 2022-10-28 DIAGNOSIS — I69818 Other symptoms and signs involving cognitive functions following other cerebrovascular disease: Secondary | ICD-10-CM | POA: Diagnosis not present

## 2022-10-28 DIAGNOSIS — F0153 Vascular dementia, unspecified severity, with mood disturbance: Secondary | ICD-10-CM | POA: Diagnosis not present

## 2022-10-28 DIAGNOSIS — I10 Essential (primary) hypertension: Secondary | ICD-10-CM | POA: Diagnosis not present

## 2022-10-28 DIAGNOSIS — E119 Type 2 diabetes mellitus without complications: Secondary | ICD-10-CM | POA: Diagnosis not present

## 2022-10-28 DIAGNOSIS — Z8781 Personal history of (healed) traumatic fracture: Secondary | ICD-10-CM | POA: Diagnosis not present

## 2022-10-28 DIAGNOSIS — J690 Pneumonitis due to inhalation of food and vomit: Secondary | ICD-10-CM | POA: Diagnosis not present

## 2022-10-29 ENCOUNTER — Non-Acute Institutional Stay (SKILLED_NURSING_FACILITY): Payer: Medicare Other | Admitting: Orthopedic Surgery

## 2022-10-29 ENCOUNTER — Encounter: Payer: Self-pay | Admitting: Orthopedic Surgery

## 2022-10-29 DIAGNOSIS — L03012 Cellulitis of left finger: Secondary | ICD-10-CM

## 2022-10-29 DIAGNOSIS — F03918 Unspecified dementia, unspecified severity, with other behavioral disturbance: Secondary | ICD-10-CM

## 2022-10-29 MED ORDER — AMOXICILLIN-POT CLAVULANATE 875-125 MG PO TABS
1.0000 | ORAL_TABLET | Freq: Two times a day (BID) | ORAL | Status: DC
Start: 2022-10-29 — End: 2022-11-05

## 2022-10-29 NOTE — Progress Notes (Addendum)
Location:  [facility] Room Number: 39/A Place of Service:  SNF [phone]) Provider:  Octavia Heir, NP   Mahlon Gammon, MD  Patient Care Team: Mahlon Gammon, MD as PCP - General (Internal Medicine)  Extended Emergency Contact Information Primary Emergency Contact: Harris,Margaret Address: [address]          [place], [place] [address] [place] of [place] Home Phone: [phone] Mobile Phone: [phone] Relation: Daughter  Code Status:  DNR Goals of care: Advanced Directive information    09/10/2022    9:12 AM  Advanced Directives  Does Patient Have a Medical Advance Directive? Yes  Type of Estate agent of [place];Living will;Out of facility DNR (pink MOST or yellow form)  Does patient want to make changes to medical advance directive? No - Patient declined  Copy of Healthcare Power of Attorney in Chart? Yes - validated most recent copy scanned in chart (See row information)     Chief Complaint  Patient presents with   Acute Visit    Left hand 2nd digit infection    HPI:  Pt is a 89 y.o. female seen today due to ongoing infection left pointer finger.   She currently resides on the skilled nursing unit at [facility]. Past medical includes: HTN, constipation, T2DM, hypothyroidism, OA, urinary retention, vascular dementia, depression and anxiety.    "08/26 nursing reports increased redness, tenderness and swelling to left pointer finger. Symptoms began 08/25, first noticed by family. No reported injury per chart review. She is a poor historian due to advanced dementia. She is able to move finger, but confirms pain. Afebrile. Vitals stable." She was prescribed doxycycline x 7 days and advised to soak finger in Epson salts BID.   Today, nursing reports 2 areas of pus to left pointer finger. Finger also more painful to touch. She has been receiving doxycycline as prescribed.       Past Medical History:  Diagnosis Date    Burst fracture of lumbar vertebra, sequela 12/27/2019   Closed left hip fracture (HCC) 04/07/2020   Diabetes mellitus type 2 in obese 11/09/2017   12/13/19 Hgb a1c 6.8, Na 141, K 4.3, Bun 16, creat 0.78, eGFR 67, wbc 9.6, Hgb 12.6, plt 318, neutrophils 60.9%, LDL 51  01/10/20 wbc 14.3, Hgb 13.6, plt 523, neutrophils 70.5%, Na 142, K 3.3, Bun 27, creat 0.62, eGFR 80  01/31/20 eye exam when the patient is able.   01/08/21 Hgb a1c 5.5   Fall as cause of accidental injury in residential institution as place of occurrence 04/07/2020   Hearing loss    History of fracture of clavicle    Hypokalemia 01/21/2020   01/21/20 Na 141, K 4.1, Bun 12, creat 0.65, eGFR 79, wbc 9.2, Hgb 12.7, plt 331   Hypothyroidism    Insomnia    Right lower lobe pneumonia 01/14/2020   Tachycardia 04/03/2019   Tinnitus    Unspecified fracture of the lower end of left radius, initial encounter for closed fracture 04/07/2020   Past Surgical History:  Procedure Laterality Date   CHOLECYSTECTOMY  2007   Dr. Alan Mulder   CLAVICLE EXCISION  1986   INTRAMEDULLARY (IM) NAIL INTERTROCHANTERIC Left 04/09/2020   Procedure: LEFT INTERTROCHANTERIC INTRAMEDULLARY (IM) NAIL;  Surgeon: Tarry Kos, MD;  Location: [facility] OR;  Service: Orthopedics;  Laterality: Left;   MOHS SURGERY     past 10 years chest & legs   OPEN REDUCTION INTERNAL FIXATION (ORIF) DISTAL RADIAL FRACTURE Left  04/09/2020   Procedure: CLOSED REDUCTION LEFT DISTAL RADIUS FRACTURE;  Surgeon: Tarry Kos, MD;  Location: [facility] OR;  Service: Orthopedics;  Laterality: Left;    Allergies  Allergen Reactions   Metformin And Related Diarrhea    Outpatient Encounter Medications as of 10/29/2022  Medication Sig   acetaminophen (TYLENOL) 325 MG tablet Take 650 mg by mouth 3 (three) times daily as needed.   albuterol (PROVENTIL) (2.5 MG/3ML) 0.083% nebulizer solution Take 3 mLs (2.5 mg total) by nebulization 2 (two) times daily for 3 days.   Cholecalciferol (D3) 50 MCG (2000 UT) TABS  Take by mouth.   DIMETHICONE, TOPICAL, (CERAVE BABY) 1 % LOTN Apply 1 Application topically daily. Apply to shoulders, back, and chest.   divalproex (DEPAKOTE) 125 MG DR tablet Take 125 mg by mouth daily.   docusate sodium (COLACE) 100 MG capsule Take 100 mg by mouth daily.   doxycycline (VIBRA-TABS) 100 MG tablet Take 1 tablet (100 mg total) by mouth 2 (two) times daily for 7 days.   Emollient (CERAVE) LOTN Apply 1 Application topically as directed. Apply to back and chest topically every shift every 2 days for dry skin.   Emollient (CERAVE) LOTN Apply 1 Application topically daily. Apply to back and chest.   Glucerna (GLUCERNA) LIQD Take 240 mLs by mouth as needed.   hydrocortisone cream 1 % Apply 1 application  topically 2 (two) times daily. Apply to back and buttocks   ipratropium-albuterol (DUONEB) 0.5-2.5 (3) MG/3ML SOLN Take 3 mLs by nebulization every 6 (six) hours as needed.   levothyroxine (SYNTHROID) 75 MCG tablet TAKE 1 TABLET (75 MCG TOTAL) BY MOUTH DAILY BEFORE BREAKFAST.   metoprolol tartrate (LOPRESSOR) 25 MG tablet Take 12.5 mg by mouth 2 (two) times daily.   nystatin powder Apply 1 Application topically 3 (three) times daily.   ondansetron (ZOFRAN) 4 MG tablet Take 1 tablet (4 mg total) by mouth 2 (two) times daily as needed for nausea or vomiting.   polyethylene glycol (MIRALAX / GLYCOLAX) 17 g packet Take 17 g by mouth daily as needed for moderate constipation.   predniSONE (DELTASONE) 20 MG tablet Take 1 tablet (20 mg total) by mouth daily with breakfast for 7 days.   QUEtiapine (SEROQUEL) 25 MG tablet Take 37.5 mg by mouth daily at 2 PM. Hold 09/13- 09/18 while on paxlovid   sertraline (ZOLOFT) 50 MG tablet Take 75 mg by mouth daily.   Sodium Fluoride (PREVIDENT 5000 BOOSTER PLUS) 1.1 % PSTE Place 1 Application onto teeth in the morning and at bedtime.   zinc oxide 20 % ointment Apply 1 application topically as needed for irritation.   No facility-administered encounter  medications on file as of 10/29/2022.    Review of Systems  Unable to perform ROS: Dementia    Immunization History  Administered Date(s) Administered   DTaP 07/20/2013   Fluad Quad(high Dose 65+) 12/23/2021   Influenza, High Dose Seasonal PF 12/07/2016   Influenza,inj,Quad PF,6+ Mos 12/01/2017   Influenza-Unspecified 11/20/2014, 12/12/2015, 11/20/2019   Moderna SARS-COV2 Booster Vaccination 08/06/2020   Moderna Sars-Covid-2 Vaccination 03/05/2019, 04/02/2019   PFIZER(Purple Top)SARS-COV-2 Vaccination 04/01/2020, 08/06/2020, 11/19/2020, 01/05/2022   PPD Test 08/06/2014   Pneumococcal Conjugate-13 11/07/2013   Pneumococcal Polysaccharide-23 03/22/2017   Tetanus 03/02/2015   Zoster Recombinant(Shingrix) 06/07/2017, 08/26/2017   Zoster, Live 03/01/2006   Pertinent  Health Maintenance Due  Topic Date Due   INFLUENZA VACCINE  09/30/2022   HEMOGLOBIN A1C  11/18/2022   OPHTHALMOLOGY EXAM  11/19/2022  FOOT EXAM  10/22/2023   DEXA SCAN  Completed      09/12/2020   12:07 PM 09/28/2021   10:30 AM 05/19/2022   12:28 PM 09/10/2022    2:05 PM 10/25/2022    9:27 AM  Fall Risk  Falls in the past year? 1 1 1  0 0  Was there an injury with Fall? 1 0 0 0 0  Fall Risk Category Calculator 3 2 1  0 0  Fall Risk Category (Retired) High Moderate     (RETIRED) Patient Fall Risk Level  Moderate fall risk     Patient at Risk for Falls Due to History of fall(s);Impaired balance/gait;Impaired mobility;Mental status change History of fall(s);Impaired balance/gait;Impaired mobility History of fall(s);Impaired balance/gait;Impaired mobility History of fall(s);Impaired balance/gait;Impaired mobility History of fall(s);Impaired balance/gait;Impaired mobility  Fall risk Follow up Falls evaluation completed;Education provided;Falls prevention discussed Falls evaluation completed;Education provided;Falls prevention discussed Falls evaluation completed;Education provided;Falls prevention discussed Falls evaluation  completed;Education provided;Falls prevention discussed Falls evaluation completed;Education provided;Falls prevention discussed   Functional Status Survey:    Vitals:   10/29/22 1553  BP: (!) 135/92  Pulse: 80  Resp: 20  Temp: (!) 97 F (36.1 C)  SpO2: 99%  Weight: 149 lb 14.4 oz (68 kg)  Height: 5' (1.524 m)   Body mass index is 29.28 kg/m. Physical Exam Vitals reviewed.  Constitutional:      General: She is not in acute distress. HENT:     Head: Normocephalic.  Eyes:     General:        Right eye: No discharge.        Left eye: No discharge.  Cardiovascular:     Rate and Rhythm: Normal rate and regular rhythm.     Pulses: Normal pulses.     Heart sounds: Normal heart sounds.  Pulmonary:     Effort: Pulmonary effort is normal. No respiratory distress.     Breath sounds: Normal breath sounds. No wheezing.  Abdominal:     General: Bowel sounds are normal.     Palpations: Abdomen is soft.  Musculoskeletal:     Cervical back: Neck supple.     Right lower leg: No edema.     Left lower leg: No edema.  Skin:    General: Skin is warm.     Capillary Refill: Capillary refill takes less than 2 seconds.     Comments: 2 small areas of pus surrounding nailbed of left pointer finger, I/D performed>purulent drainage expressed, tolerated well, surrounding tissue red and inflamed.   Neurological:     General: No focal deficit present.     Mental Status: She is alert. Mental status is at baseline.     Motor: Weakness present.     Gait: Gait abnormal.     Labs reviewed: Recent Labs    04/23/22 0000 05/18/22 0000 08/31/22 0000  NA 137 142 137  K 4.4 4.4 3.9  CL 101 105 102  CO2 26* 28* 27*  BUN 22* 23* 16  CREATININE 0.7 0.7 0.7  CALCIUM 9.2 8.7 9.1   Recent Labs    11/05/21 0000 04/23/22 0000 05/18/22 0000  AST 14 14 11*  ALT 13 10 13   ALKPHOS 96 95 100  ALBUMIN 3.9 3.8 3.4*   Recent Labs    04/23/22 0000 05/18/22 0000 08/31/22 0000  WBC 10.3 9.8 9.6   NEUTROABS 6,489.00  --  7,085.00  HGB 13.4 12.4 12.6  HCT 41 38 39  PLT 280 259 262   Lab  Results  Component Value Date   TSH 2.18 07/02/2022   Lab Results  Component Value Date   HGBA1C 7.2 05/18/2022   Lab Results  Component Value Date   CHOL 158 05/18/2022   HDL 37 05/18/2022   LDLCALC 93 05/18/2022   TRIG 187 (A) 05/18/2022   CHOLHDL 3.0 12/13/2019    Significant Diagnostic Results in last 30 days:  No results found.  Assessment/Plan 1. Paronychia of finger, left - ongoing - I/D performed due to developing pus, increased erythema/swelling - purulent drainage expressed - discontinue doxycycline - start Augmentin - extend epson salt soaks x 5 days - amoxicillin-clavulanate (AUGMENTIN) 875-125 MG tablet; Take 1 tablet by mouth 2 (two) times daily for 14 days  2. Dementia with behavioral disturbance (HCC) - followed by hospice - no behaviors - dependent with ADLs except feeding - cont Depakote and Seroquel - cont skilled nursing    Family/ staff Communication: plan discussed with patient and nurse  Labs/tests ordered:  none

## 2022-10-31 DIAGNOSIS — F0153 Vascular dementia, unspecified severity, with mood disturbance: Secondary | ICD-10-CM | POA: Diagnosis not present

## 2022-10-31 DIAGNOSIS — Z8781 Personal history of (healed) traumatic fracture: Secondary | ICD-10-CM | POA: Diagnosis not present

## 2022-10-31 DIAGNOSIS — I10 Essential (primary) hypertension: Secondary | ICD-10-CM | POA: Diagnosis not present

## 2022-10-31 DIAGNOSIS — J690 Pneumonitis due to inhalation of food and vomit: Secondary | ICD-10-CM | POA: Diagnosis not present

## 2022-10-31 DIAGNOSIS — E119 Type 2 diabetes mellitus without complications: Secondary | ICD-10-CM | POA: Diagnosis not present

## 2022-10-31 DIAGNOSIS — E039 Hypothyroidism, unspecified: Secondary | ICD-10-CM | POA: Diagnosis not present

## 2022-10-31 DIAGNOSIS — I69818 Other symptoms and signs involving cognitive functions following other cerebrovascular disease: Secondary | ICD-10-CM | POA: Diagnosis not present

## 2022-11-01 DIAGNOSIS — I69818 Other symptoms and signs involving cognitive functions following other cerebrovascular disease: Secondary | ICD-10-CM | POA: Diagnosis not present

## 2022-11-01 DIAGNOSIS — I10 Essential (primary) hypertension: Secondary | ICD-10-CM | POA: Diagnosis not present

## 2022-11-01 DIAGNOSIS — E119 Type 2 diabetes mellitus without complications: Secondary | ICD-10-CM | POA: Diagnosis not present

## 2022-11-01 DIAGNOSIS — J690 Pneumonitis due to inhalation of food and vomit: Secondary | ICD-10-CM | POA: Diagnosis not present

## 2022-11-01 DIAGNOSIS — F0153 Vascular dementia, unspecified severity, with mood disturbance: Secondary | ICD-10-CM | POA: Diagnosis not present

## 2022-11-01 DIAGNOSIS — Z8781 Personal history of (healed) traumatic fracture: Secondary | ICD-10-CM | POA: Diagnosis not present

## 2022-11-02 DIAGNOSIS — E119 Type 2 diabetes mellitus without complications: Secondary | ICD-10-CM | POA: Diagnosis not present

## 2022-11-02 DIAGNOSIS — Z8781 Personal history of (healed) traumatic fracture: Secondary | ICD-10-CM | POA: Diagnosis not present

## 2022-11-02 DIAGNOSIS — F0153 Vascular dementia, unspecified severity, with mood disturbance: Secondary | ICD-10-CM | POA: Diagnosis not present

## 2022-11-02 DIAGNOSIS — J690 Pneumonitis due to inhalation of food and vomit: Secondary | ICD-10-CM | POA: Diagnosis not present

## 2022-11-02 DIAGNOSIS — I10 Essential (primary) hypertension: Secondary | ICD-10-CM | POA: Diagnosis not present

## 2022-11-02 DIAGNOSIS — I69818 Other symptoms and signs involving cognitive functions following other cerebrovascular disease: Secondary | ICD-10-CM | POA: Diagnosis not present

## 2022-11-03 DIAGNOSIS — Z8781 Personal history of (healed) traumatic fracture: Secondary | ICD-10-CM | POA: Diagnosis not present

## 2022-11-03 DIAGNOSIS — F0153 Vascular dementia, unspecified severity, with mood disturbance: Secondary | ICD-10-CM | POA: Diagnosis not present

## 2022-11-03 DIAGNOSIS — J690 Pneumonitis due to inhalation of food and vomit: Secondary | ICD-10-CM | POA: Diagnosis not present

## 2022-11-03 DIAGNOSIS — E119 Type 2 diabetes mellitus without complications: Secondary | ICD-10-CM | POA: Diagnosis not present

## 2022-11-03 DIAGNOSIS — I69818 Other symptoms and signs involving cognitive functions following other cerebrovascular disease: Secondary | ICD-10-CM | POA: Diagnosis not present

## 2022-11-03 DIAGNOSIS — I10 Essential (primary) hypertension: Secondary | ICD-10-CM | POA: Diagnosis not present

## 2022-11-04 DIAGNOSIS — E119 Type 2 diabetes mellitus without complications: Secondary | ICD-10-CM | POA: Diagnosis not present

## 2022-11-04 DIAGNOSIS — I69818 Other symptoms and signs involving cognitive functions following other cerebrovascular disease: Secondary | ICD-10-CM | POA: Diagnosis not present

## 2022-11-04 DIAGNOSIS — F0153 Vascular dementia, unspecified severity, with mood disturbance: Secondary | ICD-10-CM | POA: Diagnosis not present

## 2022-11-04 DIAGNOSIS — Z8781 Personal history of (healed) traumatic fracture: Secondary | ICD-10-CM | POA: Diagnosis not present

## 2022-11-04 DIAGNOSIS — I10 Essential (primary) hypertension: Secondary | ICD-10-CM | POA: Diagnosis not present

## 2022-11-04 DIAGNOSIS — J690 Pneumonitis due to inhalation of food and vomit: Secondary | ICD-10-CM | POA: Diagnosis not present

## 2022-11-05 DIAGNOSIS — J690 Pneumonitis due to inhalation of food and vomit: Secondary | ICD-10-CM | POA: Diagnosis not present

## 2022-11-05 DIAGNOSIS — I69818 Other symptoms and signs involving cognitive functions following other cerebrovascular disease: Secondary | ICD-10-CM | POA: Diagnosis not present

## 2022-11-05 DIAGNOSIS — Z8781 Personal history of (healed) traumatic fracture: Secondary | ICD-10-CM | POA: Diagnosis not present

## 2022-11-05 DIAGNOSIS — I10 Essential (primary) hypertension: Secondary | ICD-10-CM | POA: Diagnosis not present

## 2022-11-05 DIAGNOSIS — E119 Type 2 diabetes mellitus without complications: Secondary | ICD-10-CM | POA: Diagnosis not present

## 2022-11-05 DIAGNOSIS — F0153 Vascular dementia, unspecified severity, with mood disturbance: Secondary | ICD-10-CM | POA: Diagnosis not present

## 2022-11-05 MED ORDER — AMOXICILLIN-POT CLAVULANATE 875-125 MG PO TABS
1.0000 | ORAL_TABLET | Freq: Two times a day (BID) | ORAL | Status: AC
Start: 2022-10-29 — End: 2022-11-12

## 2022-11-05 NOTE — Addendum Note (Signed)
Addended byHazle Nordmann E on: 11/05/2022 03:48 PM   Modules accepted: Orders

## 2022-11-06 DIAGNOSIS — I10 Essential (primary) hypertension: Secondary | ICD-10-CM | POA: Diagnosis not present

## 2022-11-06 DIAGNOSIS — J690 Pneumonitis due to inhalation of food and vomit: Secondary | ICD-10-CM | POA: Diagnosis not present

## 2022-11-06 DIAGNOSIS — F0153 Vascular dementia, unspecified severity, with mood disturbance: Secondary | ICD-10-CM | POA: Diagnosis not present

## 2022-11-06 DIAGNOSIS — I69818 Other symptoms and signs involving cognitive functions following other cerebrovascular disease: Secondary | ICD-10-CM | POA: Diagnosis not present

## 2022-11-06 DIAGNOSIS — Z8781 Personal history of (healed) traumatic fracture: Secondary | ICD-10-CM | POA: Diagnosis not present

## 2022-11-06 DIAGNOSIS — E119 Type 2 diabetes mellitus without complications: Secondary | ICD-10-CM | POA: Diagnosis not present

## 2022-11-07 DIAGNOSIS — I10 Essential (primary) hypertension: Secondary | ICD-10-CM | POA: Diagnosis not present

## 2022-11-07 DIAGNOSIS — I69818 Other symptoms and signs involving cognitive functions following other cerebrovascular disease: Secondary | ICD-10-CM | POA: Diagnosis not present

## 2022-11-07 DIAGNOSIS — F0153 Vascular dementia, unspecified severity, with mood disturbance: Secondary | ICD-10-CM | POA: Diagnosis not present

## 2022-11-07 DIAGNOSIS — E119 Type 2 diabetes mellitus without complications: Secondary | ICD-10-CM | POA: Diagnosis not present

## 2022-11-07 DIAGNOSIS — J690 Pneumonitis due to inhalation of food and vomit: Secondary | ICD-10-CM | POA: Diagnosis not present

## 2022-11-07 DIAGNOSIS — Z8781 Personal history of (healed) traumatic fracture: Secondary | ICD-10-CM | POA: Diagnosis not present

## 2022-11-08 DIAGNOSIS — Z8781 Personal history of (healed) traumatic fracture: Secondary | ICD-10-CM | POA: Diagnosis not present

## 2022-11-08 DIAGNOSIS — I69818 Other symptoms and signs involving cognitive functions following other cerebrovascular disease: Secondary | ICD-10-CM | POA: Diagnosis not present

## 2022-11-08 DIAGNOSIS — F0153 Vascular dementia, unspecified severity, with mood disturbance: Secondary | ICD-10-CM | POA: Diagnosis not present

## 2022-11-08 DIAGNOSIS — E119 Type 2 diabetes mellitus without complications: Secondary | ICD-10-CM | POA: Diagnosis not present

## 2022-11-08 DIAGNOSIS — J690 Pneumonitis due to inhalation of food and vomit: Secondary | ICD-10-CM | POA: Diagnosis not present

## 2022-11-08 DIAGNOSIS — I10 Essential (primary) hypertension: Secondary | ICD-10-CM | POA: Diagnosis not present

## 2022-11-09 DIAGNOSIS — J690 Pneumonitis due to inhalation of food and vomit: Secondary | ICD-10-CM | POA: Diagnosis not present

## 2022-11-09 DIAGNOSIS — Z8781 Personal history of (healed) traumatic fracture: Secondary | ICD-10-CM | POA: Diagnosis not present

## 2022-11-09 DIAGNOSIS — I69818 Other symptoms and signs involving cognitive functions following other cerebrovascular disease: Secondary | ICD-10-CM | POA: Diagnosis not present

## 2022-11-09 DIAGNOSIS — E119 Type 2 diabetes mellitus without complications: Secondary | ICD-10-CM | POA: Diagnosis not present

## 2022-11-09 DIAGNOSIS — F0153 Vascular dementia, unspecified severity, with mood disturbance: Secondary | ICD-10-CM | POA: Diagnosis not present

## 2022-11-09 DIAGNOSIS — I10 Essential (primary) hypertension: Secondary | ICD-10-CM | POA: Diagnosis not present

## 2022-11-10 DIAGNOSIS — J690 Pneumonitis due to inhalation of food and vomit: Secondary | ICD-10-CM | POA: Diagnosis not present

## 2022-11-10 DIAGNOSIS — E119 Type 2 diabetes mellitus without complications: Secondary | ICD-10-CM | POA: Diagnosis not present

## 2022-11-10 DIAGNOSIS — Z8781 Personal history of (healed) traumatic fracture: Secondary | ICD-10-CM | POA: Diagnosis not present

## 2022-11-10 DIAGNOSIS — F0153 Vascular dementia, unspecified severity, with mood disturbance: Secondary | ICD-10-CM | POA: Diagnosis not present

## 2022-11-10 DIAGNOSIS — I10 Essential (primary) hypertension: Secondary | ICD-10-CM | POA: Diagnosis not present

## 2022-11-10 DIAGNOSIS — I69818 Other symptoms and signs involving cognitive functions following other cerebrovascular disease: Secondary | ICD-10-CM | POA: Diagnosis not present

## 2022-11-11 DIAGNOSIS — F0153 Vascular dementia, unspecified severity, with mood disturbance: Secondary | ICD-10-CM | POA: Diagnosis not present

## 2022-11-11 DIAGNOSIS — I10 Essential (primary) hypertension: Secondary | ICD-10-CM | POA: Diagnosis not present

## 2022-11-11 DIAGNOSIS — E119 Type 2 diabetes mellitus without complications: Secondary | ICD-10-CM | POA: Diagnosis not present

## 2022-11-11 DIAGNOSIS — I69818 Other symptoms and signs involving cognitive functions following other cerebrovascular disease: Secondary | ICD-10-CM | POA: Diagnosis not present

## 2022-11-11 DIAGNOSIS — J690 Pneumonitis due to inhalation of food and vomit: Secondary | ICD-10-CM | POA: Diagnosis not present

## 2022-11-11 DIAGNOSIS — Z8781 Personal history of (healed) traumatic fracture: Secondary | ICD-10-CM | POA: Diagnosis not present

## 2022-11-12 DIAGNOSIS — Z8781 Personal history of (healed) traumatic fracture: Secondary | ICD-10-CM | POA: Diagnosis not present

## 2022-11-12 DIAGNOSIS — F0153 Vascular dementia, unspecified severity, with mood disturbance: Secondary | ICD-10-CM | POA: Diagnosis not present

## 2022-11-12 DIAGNOSIS — J690 Pneumonitis due to inhalation of food and vomit: Secondary | ICD-10-CM | POA: Diagnosis not present

## 2022-11-12 DIAGNOSIS — I10 Essential (primary) hypertension: Secondary | ICD-10-CM | POA: Diagnosis not present

## 2022-11-12 DIAGNOSIS — E119 Type 2 diabetes mellitus without complications: Secondary | ICD-10-CM | POA: Diagnosis not present

## 2022-11-12 DIAGNOSIS — I69818 Other symptoms and signs involving cognitive functions following other cerebrovascular disease: Secondary | ICD-10-CM | POA: Diagnosis not present

## 2022-11-13 DIAGNOSIS — J690 Pneumonitis due to inhalation of food and vomit: Secondary | ICD-10-CM | POA: Diagnosis not present

## 2022-11-13 DIAGNOSIS — Z8781 Personal history of (healed) traumatic fracture: Secondary | ICD-10-CM | POA: Diagnosis not present

## 2022-11-13 DIAGNOSIS — E119 Type 2 diabetes mellitus without complications: Secondary | ICD-10-CM | POA: Diagnosis not present

## 2022-11-13 DIAGNOSIS — I10 Essential (primary) hypertension: Secondary | ICD-10-CM | POA: Diagnosis not present

## 2022-11-13 DIAGNOSIS — F0153 Vascular dementia, unspecified severity, with mood disturbance: Secondary | ICD-10-CM | POA: Diagnosis not present

## 2022-11-13 DIAGNOSIS — I69818 Other symptoms and signs involving cognitive functions following other cerebrovascular disease: Secondary | ICD-10-CM | POA: Diagnosis not present

## 2022-11-14 DIAGNOSIS — Z8781 Personal history of (healed) traumatic fracture: Secondary | ICD-10-CM | POA: Diagnosis not present

## 2022-11-14 DIAGNOSIS — I69818 Other symptoms and signs involving cognitive functions following other cerebrovascular disease: Secondary | ICD-10-CM | POA: Diagnosis not present

## 2022-11-14 DIAGNOSIS — E119 Type 2 diabetes mellitus without complications: Secondary | ICD-10-CM | POA: Diagnosis not present

## 2022-11-14 DIAGNOSIS — I10 Essential (primary) hypertension: Secondary | ICD-10-CM | POA: Diagnosis not present

## 2022-11-14 DIAGNOSIS — J690 Pneumonitis due to inhalation of food and vomit: Secondary | ICD-10-CM | POA: Diagnosis not present

## 2022-11-14 DIAGNOSIS — F0153 Vascular dementia, unspecified severity, with mood disturbance: Secondary | ICD-10-CM | POA: Diagnosis not present

## 2022-11-15 DIAGNOSIS — I69818 Other symptoms and signs involving cognitive functions following other cerebrovascular disease: Secondary | ICD-10-CM | POA: Diagnosis not present

## 2022-11-15 DIAGNOSIS — I10 Essential (primary) hypertension: Secondary | ICD-10-CM | POA: Diagnosis not present

## 2022-11-15 DIAGNOSIS — F0153 Vascular dementia, unspecified severity, with mood disturbance: Secondary | ICD-10-CM | POA: Diagnosis not present

## 2022-11-15 DIAGNOSIS — E119 Type 2 diabetes mellitus without complications: Secondary | ICD-10-CM | POA: Diagnosis not present

## 2022-11-15 DIAGNOSIS — J690 Pneumonitis due to inhalation of food and vomit: Secondary | ICD-10-CM | POA: Diagnosis not present

## 2022-11-15 DIAGNOSIS — Z8781 Personal history of (healed) traumatic fracture: Secondary | ICD-10-CM | POA: Diagnosis not present

## 2022-11-16 DIAGNOSIS — F0153 Vascular dementia, unspecified severity, with mood disturbance: Secondary | ICD-10-CM | POA: Diagnosis not present

## 2022-11-16 DIAGNOSIS — I10 Essential (primary) hypertension: Secondary | ICD-10-CM | POA: Diagnosis not present

## 2022-11-16 DIAGNOSIS — I69818 Other symptoms and signs involving cognitive functions following other cerebrovascular disease: Secondary | ICD-10-CM | POA: Diagnosis not present

## 2022-11-16 DIAGNOSIS — J690 Pneumonitis due to inhalation of food and vomit: Secondary | ICD-10-CM | POA: Diagnosis not present

## 2022-11-16 DIAGNOSIS — E119 Type 2 diabetes mellitus without complications: Secondary | ICD-10-CM | POA: Diagnosis not present

## 2022-11-16 DIAGNOSIS — Z8781 Personal history of (healed) traumatic fracture: Secondary | ICD-10-CM | POA: Diagnosis not present

## 2022-11-17 DIAGNOSIS — Z8781 Personal history of (healed) traumatic fracture: Secondary | ICD-10-CM | POA: Diagnosis not present

## 2022-11-17 DIAGNOSIS — J690 Pneumonitis due to inhalation of food and vomit: Secondary | ICD-10-CM | POA: Diagnosis not present

## 2022-11-17 DIAGNOSIS — I10 Essential (primary) hypertension: Secondary | ICD-10-CM | POA: Diagnosis not present

## 2022-11-17 DIAGNOSIS — E119 Type 2 diabetes mellitus without complications: Secondary | ICD-10-CM | POA: Diagnosis not present

## 2022-11-17 DIAGNOSIS — F0153 Vascular dementia, unspecified severity, with mood disturbance: Secondary | ICD-10-CM | POA: Diagnosis not present

## 2022-11-17 DIAGNOSIS — I69818 Other symptoms and signs involving cognitive functions following other cerebrovascular disease: Secondary | ICD-10-CM | POA: Diagnosis not present

## 2022-11-18 DIAGNOSIS — I10 Essential (primary) hypertension: Secondary | ICD-10-CM | POA: Diagnosis not present

## 2022-11-18 DIAGNOSIS — I69818 Other symptoms and signs involving cognitive functions following other cerebrovascular disease: Secondary | ICD-10-CM | POA: Diagnosis not present

## 2022-11-18 DIAGNOSIS — J690 Pneumonitis due to inhalation of food and vomit: Secondary | ICD-10-CM | POA: Diagnosis not present

## 2022-11-18 DIAGNOSIS — Z8781 Personal history of (healed) traumatic fracture: Secondary | ICD-10-CM | POA: Diagnosis not present

## 2022-11-18 DIAGNOSIS — F0153 Vascular dementia, unspecified severity, with mood disturbance: Secondary | ICD-10-CM | POA: Diagnosis not present

## 2022-11-18 DIAGNOSIS — E119 Type 2 diabetes mellitus without complications: Secondary | ICD-10-CM | POA: Diagnosis not present

## 2022-11-19 DIAGNOSIS — I69818 Other symptoms and signs involving cognitive functions following other cerebrovascular disease: Secondary | ICD-10-CM | POA: Diagnosis not present

## 2022-11-19 DIAGNOSIS — J690 Pneumonitis due to inhalation of food and vomit: Secondary | ICD-10-CM | POA: Diagnosis not present

## 2022-11-19 DIAGNOSIS — E119 Type 2 diabetes mellitus without complications: Secondary | ICD-10-CM | POA: Diagnosis not present

## 2022-11-19 DIAGNOSIS — I10 Essential (primary) hypertension: Secondary | ICD-10-CM | POA: Diagnosis not present

## 2022-11-19 DIAGNOSIS — Z8781 Personal history of (healed) traumatic fracture: Secondary | ICD-10-CM | POA: Diagnosis not present

## 2022-11-19 DIAGNOSIS — F0153 Vascular dementia, unspecified severity, with mood disturbance: Secondary | ICD-10-CM | POA: Diagnosis not present

## 2022-11-20 DIAGNOSIS — J690 Pneumonitis due to inhalation of food and vomit: Secondary | ICD-10-CM | POA: Diagnosis not present

## 2022-11-20 DIAGNOSIS — F0153 Vascular dementia, unspecified severity, with mood disturbance: Secondary | ICD-10-CM | POA: Diagnosis not present

## 2022-11-20 DIAGNOSIS — Z8781 Personal history of (healed) traumatic fracture: Secondary | ICD-10-CM | POA: Diagnosis not present

## 2022-11-20 DIAGNOSIS — I69818 Other symptoms and signs involving cognitive functions following other cerebrovascular disease: Secondary | ICD-10-CM | POA: Diagnosis not present

## 2022-11-20 DIAGNOSIS — E119 Type 2 diabetes mellitus without complications: Secondary | ICD-10-CM | POA: Diagnosis not present

## 2022-11-20 DIAGNOSIS — I10 Essential (primary) hypertension: Secondary | ICD-10-CM | POA: Diagnosis not present

## 2022-11-21 DIAGNOSIS — F0153 Vascular dementia, unspecified severity, with mood disturbance: Secondary | ICD-10-CM | POA: Diagnosis not present

## 2022-11-21 DIAGNOSIS — Z8781 Personal history of (healed) traumatic fracture: Secondary | ICD-10-CM | POA: Diagnosis not present

## 2022-11-21 DIAGNOSIS — I69818 Other symptoms and signs involving cognitive functions following other cerebrovascular disease: Secondary | ICD-10-CM | POA: Diagnosis not present

## 2022-11-21 DIAGNOSIS — I10 Essential (primary) hypertension: Secondary | ICD-10-CM | POA: Diagnosis not present

## 2022-11-21 DIAGNOSIS — E119 Type 2 diabetes mellitus without complications: Secondary | ICD-10-CM | POA: Diagnosis not present

## 2022-11-21 DIAGNOSIS — J690 Pneumonitis due to inhalation of food and vomit: Secondary | ICD-10-CM | POA: Diagnosis not present

## 2022-11-22 DIAGNOSIS — J690 Pneumonitis due to inhalation of food and vomit: Secondary | ICD-10-CM | POA: Diagnosis not present

## 2022-11-22 DIAGNOSIS — I10 Essential (primary) hypertension: Secondary | ICD-10-CM | POA: Diagnosis not present

## 2022-11-22 DIAGNOSIS — F0153 Vascular dementia, unspecified severity, with mood disturbance: Secondary | ICD-10-CM | POA: Diagnosis not present

## 2022-11-22 DIAGNOSIS — E119 Type 2 diabetes mellitus without complications: Secondary | ICD-10-CM | POA: Diagnosis not present

## 2022-11-22 DIAGNOSIS — Z8781 Personal history of (healed) traumatic fracture: Secondary | ICD-10-CM | POA: Diagnosis not present

## 2022-11-22 DIAGNOSIS — I69818 Other symptoms and signs involving cognitive functions following other cerebrovascular disease: Secondary | ICD-10-CM | POA: Diagnosis not present

## 2022-11-23 DIAGNOSIS — I69818 Other symptoms and signs involving cognitive functions following other cerebrovascular disease: Secondary | ICD-10-CM | POA: Diagnosis not present

## 2022-11-23 DIAGNOSIS — I10 Essential (primary) hypertension: Secondary | ICD-10-CM | POA: Diagnosis not present

## 2022-11-23 DIAGNOSIS — F0153 Vascular dementia, unspecified severity, with mood disturbance: Secondary | ICD-10-CM | POA: Diagnosis not present

## 2022-11-23 DIAGNOSIS — E119 Type 2 diabetes mellitus without complications: Secondary | ICD-10-CM | POA: Diagnosis not present

## 2022-11-23 DIAGNOSIS — Z8781 Personal history of (healed) traumatic fracture: Secondary | ICD-10-CM | POA: Diagnosis not present

## 2022-11-23 DIAGNOSIS — J690 Pneumonitis due to inhalation of food and vomit: Secondary | ICD-10-CM | POA: Diagnosis not present

## 2022-11-24 DIAGNOSIS — Z8781 Personal history of (healed) traumatic fracture: Secondary | ICD-10-CM | POA: Diagnosis not present

## 2022-11-24 DIAGNOSIS — I10 Essential (primary) hypertension: Secondary | ICD-10-CM | POA: Diagnosis not present

## 2022-11-24 DIAGNOSIS — F0153 Vascular dementia, unspecified severity, with mood disturbance: Secondary | ICD-10-CM | POA: Diagnosis not present

## 2022-11-24 DIAGNOSIS — I69818 Other symptoms and signs involving cognitive functions following other cerebrovascular disease: Secondary | ICD-10-CM | POA: Diagnosis not present

## 2022-11-24 DIAGNOSIS — J690 Pneumonitis due to inhalation of food and vomit: Secondary | ICD-10-CM | POA: Diagnosis not present

## 2022-11-24 DIAGNOSIS — E119 Type 2 diabetes mellitus without complications: Secondary | ICD-10-CM | POA: Diagnosis not present

## 2022-11-25 ENCOUNTER — Non-Acute Institutional Stay (SKILLED_NURSING_FACILITY): Payer: Medicare Other | Admitting: Internal Medicine

## 2022-11-25 ENCOUNTER — Encounter: Payer: Self-pay | Admitting: Internal Medicine

## 2022-11-25 DIAGNOSIS — F03918 Unspecified dementia, unspecified severity, with other behavioral disturbance: Secondary | ICD-10-CM | POA: Diagnosis not present

## 2022-11-25 DIAGNOSIS — L03012 Cellulitis of left finger: Secondary | ICD-10-CM | POA: Diagnosis not present

## 2022-11-25 DIAGNOSIS — I69818 Other symptoms and signs involving cognitive functions following other cerebrovascular disease: Secondary | ICD-10-CM | POA: Diagnosis not present

## 2022-11-25 DIAGNOSIS — Z8781 Personal history of (healed) traumatic fracture: Secondary | ICD-10-CM | POA: Diagnosis not present

## 2022-11-25 DIAGNOSIS — E119 Type 2 diabetes mellitus without complications: Secondary | ICD-10-CM | POA: Diagnosis not present

## 2022-11-25 DIAGNOSIS — J690 Pneumonitis due to inhalation of food and vomit: Secondary | ICD-10-CM | POA: Diagnosis not present

## 2022-11-25 DIAGNOSIS — F0153 Vascular dementia, unspecified severity, with mood disturbance: Secondary | ICD-10-CM | POA: Diagnosis not present

## 2022-11-25 DIAGNOSIS — I10 Essential (primary) hypertension: Secondary | ICD-10-CM | POA: Diagnosis not present

## 2022-11-25 NOTE — Progress Notes (Signed)
Location:  [facility] Room Number: N39A Place of Service:  SNF [phone]) Provider:  Mahlon Gammon, MD  Patient Care Team: Mahlon Gammon, MD as PCP - General (Internal Medicine)  Extended Emergency Contact Information Primary Emergency Contact: Harris,Margaret Address: [address]          [place], [place] [address] Darden [place] of [place] Home Phone: [phone] Mobile Phone: [phone] Relation: Daughter  Code Status:  DNR Goals of care: Advanced Directive information    11/25/2022    2:46 PM  Advanced Directives  Does Patient Have a Medical Advance Directive? Yes  Type of Estate agent of [place];Out of facility DNR (pink MOST or yellow form);Living will  Does patient want to make changes to medical advance directive? No - Patient declined  Copy of Healthcare Power of Attorney in Chart? Yes - validated most recent copy scanned in chart (See row information)     Chief Complaint  Patient presents with   Acute Visit    Left Finger Paronychia    HPI:  Pt is a 89 y.o. female seen today for an acute visit for Left Finger Paronychia.   [name] Resident of SNF/Hospice  Finger paronychia Has received 2 round of antibiotics Doxycyline and Augmentin Also did soaking in Epsom  Still has redness around the nail Bed Not tender No Discharge/pus    Patient has a history of type 2 diabetes, hyperlipidemia, depression, dementia, hypothyroidism, osteopenia, stress and urge urinary incontinence, Tachycardia  h/o Hip fracture and Compression fracture after falls   Past Medical History:  Diagnosis Date   Burst fracture of lumbar vertebra, sequela 12/27/2019   Closed left hip fracture (HCC) 04/07/2020   Diabetes mellitus type 2 in obese 11/09/2017   12/13/19 Hgb a1c 6.8, Na 141, K 4.3, Bun 16, creat 0.78, eGFR 67, wbc 9.6, Hgb 12.6, plt 318, neutrophils 60.9%, LDL 51  01/10/20 wbc 14.3, Hgb 13.6, plt 523, neutrophils 70.5%, Na 142, K 3.3,  Bun 27, creat 0.62, eGFR 80  01/31/20 eye exam when the patient is able.   01/08/21 Hgb a1c 5.5   Fall as cause of accidental injury in residential institution as place of occurrence 04/07/2020   Hearing loss    History of fracture of clavicle    Hypokalemia 01/21/2020   01/21/20 Na 141, K 4.1, Bun 12, creat 0.65, eGFR 79, wbc 9.2, Hgb 12.7, plt 331   Hypothyroidism    Insomnia    Right lower lobe pneumonia 01/14/2020   Tachycardia 04/03/2019   Tinnitus    Unspecified fracture of the lower end of left radius, initial encounter for closed fracture 04/07/2020   Past Surgical History:  Procedure Laterality Date   CHOLECYSTECTOMY  2007   Dr. Alan Mulder   CLAVICLE EXCISION  1986   INTRAMEDULLARY (IM) NAIL INTERTROCHANTERIC Left 04/09/2020   Procedure: LEFT INTERTROCHANTERIC INTRAMEDULLARY (IM) NAIL;  Surgeon: Tarry Kos, MD;  Location: [facility] OR;  Service: Orthopedics;  Laterality: Left;   MOHS SURGERY     past 10 years chest & legs   OPEN REDUCTION INTERNAL FIXATION (ORIF) DISTAL RADIAL FRACTURE Left 04/09/2020   Procedure: CLOSED REDUCTION LEFT DISTAL RADIUS FRACTURE;  Surgeon: Tarry Kos, MD;  Location: [facility] OR;  Service: Orthopedics;  Laterality: Left;    Allergies  Allergen Reactions   Metformin And Related Diarrhea    Outpatient Encounter Medications as of 11/25/2022  Medication Sig   acetaminophen (TYLENOL) 325 MG tablet Take 650 mg by mouth 3 (  three) times daily as needed.   DIMETHICONE, TOPICAL, (CERAVE BABY) 1 % LOTN Apply 1 Application topically daily. Apply to shoulders, back, and chest.   divalproex (DEPAKOTE) 125 MG DR tablet Take 125 mg by mouth daily.   docusate sodium (COLACE) 100 MG capsule Take 100 mg by mouth daily.   Emollient (CERAVE) LOTN Apply 1 Application topically as directed. Apply to back and chest topically every shift every 2 days for dry skin.   Emollient (CERAVE) LOTN Apply 1 Application topically daily. Apply to back and chest.   Glucerna (GLUCERNA) LIQD Take  240 mLs by mouth as needed.   hydrocortisone cream 1 % Apply 1 Application topically 2 (two) times daily as needed for itching.   ipratropium-albuterol (DUONEB) 0.5-2.5 (3) MG/3ML SOLN Take 3 mLs by nebulization every 6 (six) hours as needed.   levothyroxine (SYNTHROID) 75 MCG tablet TAKE 1 TABLET (75 MCG TOTAL) BY MOUTH DAILY BEFORE BREAKFAST.   metoprolol tartrate (LOPRESSOR) 25 MG tablet Take 12.5 mg by mouth 2 (two) times daily.   nystatin powder Apply 1 Application topically daily as needed.   ondansetron (ZOFRAN) 4 MG tablet Take 1 tablet (4 mg total) by mouth 2 (two) times daily as needed for nausea or vomiting.   OXYGEN 2lpm as needed   polyethylene glycol (MIRALAX / GLYCOLAX) 17 g packet Take 17 g by mouth daily as needed for moderate constipation.   QUEtiapine (SEROQUEL) 25 MG tablet Take 37.5 mg by mouth daily at 2 PM. Hold 09/13- 09/18 while on paxlovid   sertraline (ZOLOFT) 50 MG tablet Take 75 mg by mouth daily.   Sodium Fluoride (PREVIDENT 5000 BOOSTER PLUS) 1.1 % PSTE Place 1 Application onto teeth in the morning and at bedtime.   zinc oxide 20 % ointment Apply 1 application topically as needed for irritation.   [DISCONTINUED] albuterol (PROVENTIL) (2.5 MG/3ML) 0.083% nebulizer solution Take 3 mLs (2.5 mg total) by nebulization 2 (two) times daily for 3 days.   [DISCONTINUED] Cholecalciferol (D3) 50 MCG (2000 UT) TABS Take by mouth.   No facility-administered encounter medications on file as of 11/25/2022.    Review of Systems  Unable to perform ROS: Dementia    Immunization History  Administered Date(s) Administered   DTaP 07/20/2013   Fluad Quad(high Dose 65+) 12/23/2021   Influenza, High Dose Seasonal PF 12/07/2016   Influenza,inj,Quad PF,6+ Mos 12/01/2017   Influenza-Unspecified 11/20/2014, 12/12/2015, 11/20/2019   Moderna SARS-COV2 Booster Vaccination 08/06/2020   Moderna Sars-Covid-2 Vaccination 03/05/2019, 04/02/2019   PFIZER(Purple Top)SARS-COV-2 Vaccination  04/01/2020, 08/06/2020, 11/19/2020, 01/05/2022   PPD Test 08/06/2014   Pneumococcal Conjugate-13 11/07/2013   Pneumococcal Polysaccharide-23 03/22/2017   Tetanus 03/02/2015   Zoster Recombinant(Shingrix) 06/07/2017, 08/26/2017   Zoster, Live 03/01/2006   Pertinent  Health Maintenance Due  Topic Date Due   INFLUENZA VACCINE  09/30/2022   HEMOGLOBIN A1C  11/18/2022   OPHTHALMOLOGY EXAM  11/19/2022   FOOT EXAM  10/22/2023   DEXA SCAN  Completed      09/12/2020   12:07 PM 09/28/2021   10:30 AM 05/19/2022   12:28 PM 09/10/2022    2:05 PM 10/25/2022    9:27 AM  Fall Risk  Falls in the past year? 1 1 1  0 0  Was there an injury with Fall? 1 0 0 0 0  Fall Risk Category Calculator 3 2 1  0 0  Fall Risk Category (Retired) High Moderate     (RETIRED) Patient Fall Risk Level  Moderate fall risk  Patient at Risk for Falls Due to History of fall(s);Impaired balance/gait;Impaired mobility;Mental status change History of fall(s);Impaired balance/gait;Impaired mobility History of fall(s);Impaired balance/gait;Impaired mobility History of fall(s);Impaired balance/gait;Impaired mobility History of fall(s);Impaired balance/gait;Impaired mobility  Fall risk Follow up Falls evaluation completed;Education provided;Falls prevention discussed Falls evaluation completed;Education provided;Falls prevention discussed Falls evaluation completed;Education provided;Falls prevention discussed Falls evaluation completed;Education provided;Falls prevention discussed Falls evaluation completed;Education provided;Falls prevention discussed   Functional Status Survey:    Vitals:   11/25/22 1437  BP: 136/65  Pulse: 87  Resp: 18  Temp: (!) 96.7 F (35.9 C)  SpO2: 98%  Weight: 153 lb 12.8 oz (69.8 kg)  Height: 5' (1.524 m)   Body mass index is 30.04 kg/m. Physical Exam Vitals reviewed.  Constitutional:      Appearance: Normal appearance.  HENT:     Head: Normocephalic.     Nose: Nose normal.      Mouth/Throat:     Mouth: Mucous membranes are moist.     Pharynx: Oropharynx is clear.  Eyes:     Pupils: Pupils are equal, round, and reactive to light.  Cardiovascular:     Rate and Rhythm: Normal rate and regular rhythm.     Pulses: Normal pulses.     Heart sounds: Normal heart sounds. No murmur heard. Pulmonary:     Effort: Pulmonary effort is normal.     Breath sounds: Normal breath sounds.  Abdominal:     General: Abdomen is flat. Bowel sounds are normal.     Palpations: Abdomen is soft.  Musculoskeletal:        General: No swelling.     Cervical back: Neck supple.  Skin:    General: Skin is warm.     Comments: Left Hand Finger was redness around the bed No Discharge Not tender anymore no swelling  Neurological:     Mental Status: She is alert.  Psychiatric:        Mood and Affect: Mood normal.        Thought Content: Thought content normal.     Labs reviewed: Recent Labs    04/23/22 0000 05/18/22 0000 08/31/22 0000  NA 137 142 137  K 4.4 4.4 3.9  CL 101 105 102  CO2 26* 28* 27*  BUN 22* 23* 16  CREATININE 0.7 0.7 0.7  CALCIUM 9.2 8.7 9.1   Recent Labs    04/23/22 0000 05/18/22 0000  AST 14 11*  ALT 10 13  ALKPHOS 95 100  ALBUMIN 3.8 3.4*   Recent Labs    04/23/22 0000 05/18/22 0000 08/31/22 0000  WBC 10.3 9.8 9.6  NEUTROABS 6,489.00  --  7,085.00  HGB 13.4 12.4 12.6  HCT 41 38 39  PLT 280 259 262   Lab Results  Component Value Date   TSH 2.18 07/02/2022   Lab Results  Component Value Date   HGBA1C 7.2 05/18/2022   Lab Results  Component Value Date   CHOL 158 05/18/2022   HDL 37 05/18/2022   LDLCALC 93 05/18/2022   TRIG 187 (A) 05/18/2022   CHOLHDL 3.0 12/13/2019    Significant Diagnostic Results in last 30 days:  No results found.  Assessment/Plan 1. Paronychia of finger, left Mupirocin Ointment with Hydrocortisone  Loose Dressing as she keeps touching it Reval in 1 week  2. Dementia with behavioral disturbance  (HCC) Enrolled in Hospice On Depakote and Seroquel   Family/ staff Communication:   Labs/tests ordered:

## 2022-11-26 DIAGNOSIS — Z8781 Personal history of (healed) traumatic fracture: Secondary | ICD-10-CM | POA: Diagnosis not present

## 2022-11-26 DIAGNOSIS — I69818 Other symptoms and signs involving cognitive functions following other cerebrovascular disease: Secondary | ICD-10-CM | POA: Diagnosis not present

## 2022-11-26 DIAGNOSIS — J690 Pneumonitis due to inhalation of food and vomit: Secondary | ICD-10-CM | POA: Diagnosis not present

## 2022-11-26 DIAGNOSIS — I10 Essential (primary) hypertension: Secondary | ICD-10-CM | POA: Diagnosis not present

## 2022-11-26 DIAGNOSIS — E119 Type 2 diabetes mellitus without complications: Secondary | ICD-10-CM | POA: Diagnosis not present

## 2022-11-26 DIAGNOSIS — F0153 Vascular dementia, unspecified severity, with mood disturbance: Secondary | ICD-10-CM | POA: Diagnosis not present

## 2022-11-27 DIAGNOSIS — I10 Essential (primary) hypertension: Secondary | ICD-10-CM | POA: Diagnosis not present

## 2022-11-27 DIAGNOSIS — I69818 Other symptoms and signs involving cognitive functions following other cerebrovascular disease: Secondary | ICD-10-CM | POA: Diagnosis not present

## 2022-11-27 DIAGNOSIS — J690 Pneumonitis due to inhalation of food and vomit: Secondary | ICD-10-CM | POA: Diagnosis not present

## 2022-11-27 DIAGNOSIS — Z8781 Personal history of (healed) traumatic fracture: Secondary | ICD-10-CM | POA: Diagnosis not present

## 2022-11-27 DIAGNOSIS — F0153 Vascular dementia, unspecified severity, with mood disturbance: Secondary | ICD-10-CM | POA: Diagnosis not present

## 2022-11-27 DIAGNOSIS — E119 Type 2 diabetes mellitus without complications: Secondary | ICD-10-CM | POA: Diagnosis not present

## 2022-11-28 DIAGNOSIS — I69818 Other symptoms and signs involving cognitive functions following other cerebrovascular disease: Secondary | ICD-10-CM | POA: Diagnosis not present

## 2022-11-28 DIAGNOSIS — Z8781 Personal history of (healed) traumatic fracture: Secondary | ICD-10-CM | POA: Diagnosis not present

## 2022-11-28 DIAGNOSIS — J690 Pneumonitis due to inhalation of food and vomit: Secondary | ICD-10-CM | POA: Diagnosis not present

## 2022-11-28 DIAGNOSIS — F0153 Vascular dementia, unspecified severity, with mood disturbance: Secondary | ICD-10-CM | POA: Diagnosis not present

## 2022-11-28 DIAGNOSIS — E119 Type 2 diabetes mellitus without complications: Secondary | ICD-10-CM | POA: Diagnosis not present

## 2022-11-28 DIAGNOSIS — I10 Essential (primary) hypertension: Secondary | ICD-10-CM | POA: Diagnosis not present

## 2022-11-29 DIAGNOSIS — Z8781 Personal history of (healed) traumatic fracture: Secondary | ICD-10-CM | POA: Diagnosis not present

## 2022-11-29 DIAGNOSIS — J690 Pneumonitis due to inhalation of food and vomit: Secondary | ICD-10-CM | POA: Diagnosis not present

## 2022-11-29 DIAGNOSIS — E119 Type 2 diabetes mellitus without complications: Secondary | ICD-10-CM | POA: Diagnosis not present

## 2022-11-29 DIAGNOSIS — I69818 Other symptoms and signs involving cognitive functions following other cerebrovascular disease: Secondary | ICD-10-CM | POA: Diagnosis not present

## 2022-11-29 DIAGNOSIS — I10 Essential (primary) hypertension: Secondary | ICD-10-CM | POA: Diagnosis not present

## 2022-11-29 DIAGNOSIS — F0153 Vascular dementia, unspecified severity, with mood disturbance: Secondary | ICD-10-CM | POA: Diagnosis not present

## 2022-11-30 ENCOUNTER — Non-Acute Institutional Stay (SKILLED_NURSING_FACILITY): Payer: Self-pay | Admitting: Adult Health

## 2022-11-30 ENCOUNTER — Encounter: Payer: Self-pay | Admitting: Adult Health

## 2022-11-30 DIAGNOSIS — I10 Essential (primary) hypertension: Secondary | ICD-10-CM | POA: Diagnosis not present

## 2022-11-30 DIAGNOSIS — E119 Type 2 diabetes mellitus without complications: Secondary | ICD-10-CM | POA: Diagnosis not present

## 2022-11-30 DIAGNOSIS — F03918 Unspecified dementia, unspecified severity, with other behavioral disturbance: Secondary | ICD-10-CM | POA: Diagnosis not present

## 2022-11-30 DIAGNOSIS — F0153 Vascular dementia, unspecified severity, with mood disturbance: Secondary | ICD-10-CM | POA: Diagnosis not present

## 2022-11-30 DIAGNOSIS — R1310 Dysphagia, unspecified: Secondary | ICD-10-CM | POA: Diagnosis not present

## 2022-11-30 DIAGNOSIS — I69818 Other symptoms and signs involving cognitive functions following other cerebrovascular disease: Secondary | ICD-10-CM | POA: Diagnosis not present

## 2022-11-30 DIAGNOSIS — E039 Hypothyroidism, unspecified: Secondary | ICD-10-CM | POA: Diagnosis not present

## 2022-11-30 DIAGNOSIS — J690 Pneumonitis due to inhalation of food and vomit: Secondary | ICD-10-CM | POA: Diagnosis not present

## 2022-11-30 DIAGNOSIS — Z8781 Personal history of (healed) traumatic fracture: Secondary | ICD-10-CM | POA: Diagnosis not present

## 2022-11-30 MED ORDER — DIVALPROEX SODIUM 125 MG PO CSDR
125.0000 mg | DELAYED_RELEASE_CAPSULE | Freq: Every day | ORAL | Status: DC
Start: 2022-11-30 — End: 2023-03-27

## 2022-11-30 NOTE — Progress Notes (Signed)
Location:  [facility] Room Number: N39A Place of Service:  SNF (31) Provider:  Kenard Gower, DNP, FNP-BC  Patient Care Team: Mahlon Gammon, MD as PCP - General (Internal Medicine)  Extended Emergency Contact Information Primary Emergency Contact: Harris,Margaret Address: [address]          [place], [place] [address] [place] of [place] Home Phone: [phone] Mobile Phone: [phone] Relation: Daughter  Code Status:  DNR  Goals of care: Advanced Directive information    11/30/2022    2:43 PM  Advanced Directives  Does Patient Have a Medical Advance Directive? Yes  Type of Estate agent of [place];Out of facility DNR (pink MOST or yellow form);Living will  Does patient want to make changes to medical advance directive? No - Patient declined  Copy of Healthcare Power of Attorney in Chart? Yes - validated most recent copy scanned in chart (See row information)     Chief Complaint  Patient presents with   Acute Visit    difficulty swallowing pill    HPI:  Pt is a 89 y.o. female seen today for an acute visit regarding difficulty swallowing pill. She is a long-term care resident of [facility]. She takes Depakote ER 125 mg daily for mood disorder. Patient has been spitting her pill most of the time. Depakote ER cannot be crushed. She has vascular dementia. Latest BIMS score 2/15, ranging as severe cognitive impairment.   Past Medical History:  Diagnosis Date   Burst fracture of lumbar vertebra, sequela 12/27/2019   Closed left hip fracture (HCC) 04/07/2020   Diabetes mellitus type 2 in obese 11/09/2017   12/13/19 Hgb a1c 6.8, Na 141, K 4.3, Bun 16, creat 0.78, eGFR 67, wbc 9.6, Hgb 12.6, plt 318, neutrophils 60.9%, LDL 51  01/10/20 wbc 14.3, Hgb 13.6, plt 523, neutrophils 70.5%, Na 142, K 3.3, Bun 27, creat 0.62, eGFR 80  01/31/20 eye exam when the patient is able.   01/08/21 Hgb a1c 5.5   Fall as cause of  accidental injury in residential institution as place of occurrence 04/07/2020   Hearing loss    History of fracture of clavicle    Hypokalemia 01/21/2020   01/21/20 Na 141, K 4.1, Bun 12, creat 0.65, eGFR 79, wbc 9.2, Hgb 12.7, plt 331   Hypothyroidism    Insomnia    Right lower lobe pneumonia 01/14/2020   Tachycardia 04/03/2019   Tinnitus    Unspecified fracture of the lower end of left radius, initial encounter for closed fracture 04/07/2020   Past Surgical History:  Procedure Laterality Date   CHOLECYSTECTOMY  2007   Dr. Alan Mulder   CLAVICLE EXCISION  1986   INTRAMEDULLARY (IM) NAIL INTERTROCHANTERIC Left 04/09/2020   Procedure: LEFT INTERTROCHANTERIC INTRAMEDULLARY (IM) NAIL;  Surgeon: Tarry Kos, MD;  Location: [facility] OR;  Service: Orthopedics;  Laterality: Left;   MOHS SURGERY     past 10 years chest & legs   OPEN REDUCTION INTERNAL FIXATION (ORIF) DISTAL RADIAL FRACTURE Left 04/09/2020   Procedure: CLOSED REDUCTION LEFT DISTAL RADIUS FRACTURE;  Surgeon: Tarry Kos, MD;  Location: [facility] OR;  Service: Orthopedics;  Laterality: Left;    Allergies  Allergen Reactions   Metformin And Related Diarrhea    Outpatient Encounter Medications as of 11/30/2022  Medication Sig   acetaminophen (TYLENOL) 325 MG tablet Take 650 mg by mouth 3 (three) times daily as needed.   DIMETHICONE, TOPICAL, (CERAVE BABY) 1 % LOTN Apply 1  Application topically daily. Apply to shoulders, back, and chest.   divalproex (DEPAKOTE SPRINKLE) 125 MG capsule Take 1 capsule (125 mg total) by mouth daily.   docusate sodium (COLACE) 100 MG capsule Take 100 mg by mouth daily.   Emollient (CERAVE) LOTN Apply 1 Application topically as directed. Apply to back and chest topically every shift every 2 days for dry skin.   Emollient (CERAVE) LOTN Apply 1 Application topically daily. Apply to back and chest.   Glucerna (GLUCERNA) LIQD Take 240 mLs by mouth as needed.   hydrocortisone cream 1 % Apply 1 Application topically 2  (two) times daily as needed for itching.   ipratropium-albuterol (DUONEB) 0.5-2.5 (3) MG/3ML SOLN Take 3 mLs by nebulization every 6 (six) hours as needed.   levothyroxine (SYNTHROID) 75 MCG tablet TAKE 1 TABLET (75 MCG TOTAL) BY MOUTH DAILY BEFORE BREAKFAST.   metoprolol tartrate (LOPRESSOR) 25 MG tablet Take 12.5 mg by mouth 2 (two) times daily.   nystatin powder Apply 1 Application topically daily as needed.   ondansetron (ZOFRAN) 4 MG tablet Take 1 tablet (4 mg total) by mouth 2 (two) times daily as needed for nausea or vomiting.   OXYGEN 2lpm as needed   polyethylene glycol (MIRALAX / GLYCOLAX) 17 g packet Take 17 g by mouth daily as needed for moderate constipation.   QUEtiapine (SEROQUEL) 25 MG tablet Take 37.5 mg by mouth daily at 2 PM. Hold 09/13- 09/18 while on paxlovid   sertraline (ZOLOFT) 50 MG tablet Take 75 mg by mouth daily.   Sodium Fluoride (PREVIDENT 5000 BOOSTER PLUS) 1.1 % PSTE Place 1 Application onto teeth in the morning and at bedtime.   zinc oxide 20 % ointment Apply 1 application topically as needed for irritation.   [DISCONTINUED] divalproex (DEPAKOTE) 125 MG DR tablet Take 125 mg by mouth daily.   No facility-administered encounter medications on file as of 11/30/2022.    Review of Systems  Unable to obtain due to dementia.    Immunization History  Administered Date(s) Administered   DTaP 07/20/2013   Fluad Quad(high Dose 65+) 12/23/2021   Influenza, High Dose Seasonal PF 12/07/2016   Influenza,inj,Quad PF,6+ Mos 12/01/2017   Influenza-Unspecified 11/20/2014, 12/12/2015, 11/20/2019   Moderna SARS-COV2 Booster Vaccination 08/06/2020   Moderna Sars-Covid-2 Vaccination 03/05/2019, 04/02/2019   PFIZER(Purple Top)SARS-COV-2 Vaccination 04/01/2020, 08/06/2020, 11/19/2020, 01/05/2022   PPD Test 08/06/2014   Pneumococcal Conjugate-13 11/07/2013   Pneumococcal Polysaccharide-23 03/22/2017   Tetanus 03/02/2015   Zoster Recombinant(Shingrix) 06/07/2017, 08/26/2017    Zoster, Live 03/01/2006   Pertinent  Health Maintenance Due  Topic Date Due   INFLUENZA VACCINE  09/30/2022   HEMOGLOBIN A1C  11/18/2022   OPHTHALMOLOGY EXAM  11/19/2022   FOOT EXAM  10/22/2023   DEXA SCAN  Completed      09/12/2020   12:07 PM 09/28/2021   10:30 AM 05/19/2022   12:28 PM 09/10/2022    2:05 PM 10/25/2022    9:27 AM  Fall Risk  Falls in the past year? 1 1 1  0 0  Was there an injury with Fall? 1 0 0 0 0  Fall Risk Category Calculator 3 2 1  0 0  Fall Risk Category (Retired) High Moderate     (RETIRED) Patient Fall Risk Level  Moderate fall risk     Patient at Risk for Falls Due to History of fall(s);Impaired balance/gait;Impaired mobility;Mental status change History of fall(s);Impaired balance/gait;Impaired mobility History of fall(s);Impaired balance/gait;Impaired mobility History of fall(s);Impaired balance/gait;Impaired mobility History of fall(s);Impaired balance/gait;Impaired mobility  Fall risk Follow up Falls evaluation completed;Education provided;Falls prevention discussed Falls evaluation completed;Education provided;Falls prevention discussed Falls evaluation completed;Education provided;Falls prevention discussed Falls evaluation completed;Education provided;Falls prevention discussed Falls evaluation completed;Education provided;Falls prevention discussed     Vitals:   11/30/22 1436  BP: 136/65  Pulse: 87  Resp: 18  Temp: (!) 96.7 F (35.9 C)  SpO2: 98%  Weight: 152 lb 6.4 oz (69.1 kg)  Height: 5' (1.524 m)   Body mass index is 29.76 kg/m.  Physical Exam Constitutional:      General: She is not in acute distress.    Appearance: Normal appearance.  HENT:     Head: Normocephalic and atraumatic.     Nose: Nose normal.     Mouth/Throat:     Mouth: Mucous membranes are moist.  Eyes:     Conjunctiva/sclera: Conjunctivae normal.  Cardiovascular:     Rate and Rhythm: Normal rate and regular rhythm.  Pulmonary:     Effort: Pulmonary effort is  normal.     Breath sounds: Normal breath sounds.  Abdominal:     General: Bowel sounds are normal.     Palpations: Abdomen is soft.  Musculoskeletal:        General: Normal range of motion.     Cervical back: Normal range of motion.  Skin:    General: Skin is warm and dry.  Neurological:     Mental Status: She is alert. Mental status is at baseline.     Comments: Alert to self, disoriented to time and place.  Psychiatric:        Mood and Affect: Mood normal.        Behavior: Behavior normal.     Labs reviewed: Recent Labs    04/23/22 0000 05/18/22 0000 08/31/22 0000  NA 137 142 137  K 4.4 4.4 3.9  CL 101 105 102  CO2 26* 28* 27*  BUN 22* 23* 16  CREATININE 0.7 0.7 0.7  CALCIUM 9.2 8.7 9.1   Recent Labs    04/23/22 0000 05/18/22 0000  AST 14 11*  ALT 10 13  ALKPHOS 95 100  ALBUMIN 3.8 3.4*   Recent Labs    04/23/22 0000 05/18/22 0000 08/31/22 0000  WBC 10.3 9.8 9.6  NEUTROABS 6,489.00  --  7,085.00  HGB 13.4 12.4 12.6  HCT 41 38 39  PLT 280 259 262   Lab Results  Component Value Date   TSH 2.18 07/02/2022   Lab Results  Component Value Date   HGBA1C 7.2 05/18/2022   Lab Results  Component Value Date   CHOL 158 05/18/2022   HDL 37 05/18/2022   LDLCALC 93 05/18/2022   TRIG 187 (A) 05/18/2022   CHOLHDL 3.0 12/13/2019    Significant Diagnostic Results in last 30 days:  No results found.  Assessment/Plan  1. Dysphagia, unspecified type -  mood is stable -  discontinue Depakote ER and will start on Depakote Sprinkle which can be opened up and mixed with applesauce for easy swallowing -  aspiration precautions - divalproex (DEPAKOTE SPRINKLE) 125 MG capsule; Take 1 capsule (125 mg total) by mouth daily.  2. Dementia with behavioral disturbance (HCC) -  BIMS score 2.0, ranging as severe cognitive impairment -  fall precautions   Family/ staff Communication: Discussed plan of care with resident and charge nurse  Labs/tests ordered:   None    Kenard Gower, DNP, MSN, FNP-BC [facility] [phone] (Monday-Friday 8:00 a.m. - 5:00 p.m.) [phone] (after hours)

## 2022-12-01 DIAGNOSIS — I10 Essential (primary) hypertension: Secondary | ICD-10-CM | POA: Diagnosis not present

## 2022-12-01 DIAGNOSIS — E119 Type 2 diabetes mellitus without complications: Secondary | ICD-10-CM | POA: Diagnosis not present

## 2022-12-01 DIAGNOSIS — Z8781 Personal history of (healed) traumatic fracture: Secondary | ICD-10-CM | POA: Diagnosis not present

## 2022-12-01 DIAGNOSIS — F0153 Vascular dementia, unspecified severity, with mood disturbance: Secondary | ICD-10-CM | POA: Diagnosis not present

## 2022-12-01 DIAGNOSIS — I69818 Other symptoms and signs involving cognitive functions following other cerebrovascular disease: Secondary | ICD-10-CM | POA: Diagnosis not present

## 2022-12-01 DIAGNOSIS — J690 Pneumonitis due to inhalation of food and vomit: Secondary | ICD-10-CM | POA: Diagnosis not present

## 2022-12-02 DIAGNOSIS — F0153 Vascular dementia, unspecified severity, with mood disturbance: Secondary | ICD-10-CM | POA: Diagnosis not present

## 2022-12-02 DIAGNOSIS — I10 Essential (primary) hypertension: Secondary | ICD-10-CM | POA: Diagnosis not present

## 2022-12-02 DIAGNOSIS — Z8781 Personal history of (healed) traumatic fracture: Secondary | ICD-10-CM | POA: Diagnosis not present

## 2022-12-02 DIAGNOSIS — I69818 Other symptoms and signs involving cognitive functions following other cerebrovascular disease: Secondary | ICD-10-CM | POA: Diagnosis not present

## 2022-12-02 DIAGNOSIS — J690 Pneumonitis due to inhalation of food and vomit: Secondary | ICD-10-CM | POA: Diagnosis not present

## 2022-12-02 DIAGNOSIS — E119 Type 2 diabetes mellitus without complications: Secondary | ICD-10-CM | POA: Diagnosis not present

## 2022-12-03 DIAGNOSIS — J690 Pneumonitis due to inhalation of food and vomit: Secondary | ICD-10-CM | POA: Diagnosis not present

## 2022-12-03 DIAGNOSIS — Z8781 Personal history of (healed) traumatic fracture: Secondary | ICD-10-CM | POA: Diagnosis not present

## 2022-12-03 DIAGNOSIS — F0153 Vascular dementia, unspecified severity, with mood disturbance: Secondary | ICD-10-CM | POA: Diagnosis not present

## 2022-12-03 DIAGNOSIS — I69818 Other symptoms and signs involving cognitive functions following other cerebrovascular disease: Secondary | ICD-10-CM | POA: Diagnosis not present

## 2022-12-03 DIAGNOSIS — E119 Type 2 diabetes mellitus without complications: Secondary | ICD-10-CM | POA: Diagnosis not present

## 2022-12-03 DIAGNOSIS — I10 Essential (primary) hypertension: Secondary | ICD-10-CM | POA: Diagnosis not present

## 2022-12-04 DIAGNOSIS — I10 Essential (primary) hypertension: Secondary | ICD-10-CM | POA: Diagnosis not present

## 2022-12-04 DIAGNOSIS — I69818 Other symptoms and signs involving cognitive functions following other cerebrovascular disease: Secondary | ICD-10-CM | POA: Diagnosis not present

## 2022-12-04 DIAGNOSIS — J690 Pneumonitis due to inhalation of food and vomit: Secondary | ICD-10-CM | POA: Diagnosis not present

## 2022-12-04 DIAGNOSIS — E119 Type 2 diabetes mellitus without complications: Secondary | ICD-10-CM | POA: Diagnosis not present

## 2022-12-04 DIAGNOSIS — F0153 Vascular dementia, unspecified severity, with mood disturbance: Secondary | ICD-10-CM | POA: Diagnosis not present

## 2022-12-04 DIAGNOSIS — Z8781 Personal history of (healed) traumatic fracture: Secondary | ICD-10-CM | POA: Diagnosis not present

## 2022-12-05 DIAGNOSIS — Z8781 Personal history of (healed) traumatic fracture: Secondary | ICD-10-CM | POA: Diagnosis not present

## 2022-12-05 DIAGNOSIS — F0153 Vascular dementia, unspecified severity, with mood disturbance: Secondary | ICD-10-CM | POA: Diagnosis not present

## 2022-12-05 DIAGNOSIS — I69818 Other symptoms and signs involving cognitive functions following other cerebrovascular disease: Secondary | ICD-10-CM | POA: Diagnosis not present

## 2022-12-05 DIAGNOSIS — J690 Pneumonitis due to inhalation of food and vomit: Secondary | ICD-10-CM | POA: Diagnosis not present

## 2022-12-05 DIAGNOSIS — I10 Essential (primary) hypertension: Secondary | ICD-10-CM | POA: Diagnosis not present

## 2022-12-05 DIAGNOSIS — E119 Type 2 diabetes mellitus without complications: Secondary | ICD-10-CM | POA: Diagnosis not present

## 2022-12-06 DIAGNOSIS — J690 Pneumonitis due to inhalation of food and vomit: Secondary | ICD-10-CM | POA: Diagnosis not present

## 2022-12-06 DIAGNOSIS — E119 Type 2 diabetes mellitus without complications: Secondary | ICD-10-CM | POA: Diagnosis not present

## 2022-12-06 DIAGNOSIS — Z8781 Personal history of (healed) traumatic fracture: Secondary | ICD-10-CM | POA: Diagnosis not present

## 2022-12-06 DIAGNOSIS — I10 Essential (primary) hypertension: Secondary | ICD-10-CM | POA: Diagnosis not present

## 2022-12-06 DIAGNOSIS — F0153 Vascular dementia, unspecified severity, with mood disturbance: Secondary | ICD-10-CM | POA: Diagnosis not present

## 2022-12-06 DIAGNOSIS — I69818 Other symptoms and signs involving cognitive functions following other cerebrovascular disease: Secondary | ICD-10-CM | POA: Diagnosis not present

## 2022-12-07 DIAGNOSIS — E119 Type 2 diabetes mellitus without complications: Secondary | ICD-10-CM | POA: Diagnosis not present

## 2022-12-07 DIAGNOSIS — I69818 Other symptoms and signs involving cognitive functions following other cerebrovascular disease: Secondary | ICD-10-CM | POA: Diagnosis not present

## 2022-12-07 DIAGNOSIS — F0153 Vascular dementia, unspecified severity, with mood disturbance: Secondary | ICD-10-CM | POA: Diagnosis not present

## 2022-12-07 DIAGNOSIS — Z8781 Personal history of (healed) traumatic fracture: Secondary | ICD-10-CM | POA: Diagnosis not present

## 2022-12-07 DIAGNOSIS — J690 Pneumonitis due to inhalation of food and vomit: Secondary | ICD-10-CM | POA: Diagnosis not present

## 2022-12-07 DIAGNOSIS — I10 Essential (primary) hypertension: Secondary | ICD-10-CM | POA: Diagnosis not present

## 2022-12-08 DIAGNOSIS — I10 Essential (primary) hypertension: Secondary | ICD-10-CM | POA: Diagnosis not present

## 2022-12-08 DIAGNOSIS — Z8781 Personal history of (healed) traumatic fracture: Secondary | ICD-10-CM | POA: Diagnosis not present

## 2022-12-08 DIAGNOSIS — J690 Pneumonitis due to inhalation of food and vomit: Secondary | ICD-10-CM | POA: Diagnosis not present

## 2022-12-08 DIAGNOSIS — F0153 Vascular dementia, unspecified severity, with mood disturbance: Secondary | ICD-10-CM | POA: Diagnosis not present

## 2022-12-08 DIAGNOSIS — I69818 Other symptoms and signs involving cognitive functions following other cerebrovascular disease: Secondary | ICD-10-CM | POA: Diagnosis not present

## 2022-12-08 DIAGNOSIS — E119 Type 2 diabetes mellitus without complications: Secondary | ICD-10-CM | POA: Diagnosis not present

## 2022-12-09 DIAGNOSIS — I10 Essential (primary) hypertension: Secondary | ICD-10-CM | POA: Diagnosis not present

## 2022-12-09 DIAGNOSIS — Z8781 Personal history of (healed) traumatic fracture: Secondary | ICD-10-CM | POA: Diagnosis not present

## 2022-12-09 DIAGNOSIS — I69818 Other symptoms and signs involving cognitive functions following other cerebrovascular disease: Secondary | ICD-10-CM | POA: Diagnosis not present

## 2022-12-09 DIAGNOSIS — J690 Pneumonitis due to inhalation of food and vomit: Secondary | ICD-10-CM | POA: Diagnosis not present

## 2022-12-09 DIAGNOSIS — F0153 Vascular dementia, unspecified severity, with mood disturbance: Secondary | ICD-10-CM | POA: Diagnosis not present

## 2022-12-09 DIAGNOSIS — E119 Type 2 diabetes mellitus without complications: Secondary | ICD-10-CM | POA: Diagnosis not present

## 2022-12-10 DIAGNOSIS — J690 Pneumonitis due to inhalation of food and vomit: Secondary | ICD-10-CM | POA: Diagnosis not present

## 2022-12-10 DIAGNOSIS — F0153 Vascular dementia, unspecified severity, with mood disturbance: Secondary | ICD-10-CM | POA: Diagnosis not present

## 2022-12-10 DIAGNOSIS — I69818 Other symptoms and signs involving cognitive functions following other cerebrovascular disease: Secondary | ICD-10-CM | POA: Diagnosis not present

## 2022-12-10 DIAGNOSIS — E119 Type 2 diabetes mellitus without complications: Secondary | ICD-10-CM | POA: Diagnosis not present

## 2022-12-10 DIAGNOSIS — I10 Essential (primary) hypertension: Secondary | ICD-10-CM | POA: Diagnosis not present

## 2022-12-10 DIAGNOSIS — Z8781 Personal history of (healed) traumatic fracture: Secondary | ICD-10-CM | POA: Diagnosis not present

## 2022-12-11 DIAGNOSIS — F0153 Vascular dementia, unspecified severity, with mood disturbance: Secondary | ICD-10-CM | POA: Diagnosis not present

## 2022-12-11 DIAGNOSIS — J690 Pneumonitis due to inhalation of food and vomit: Secondary | ICD-10-CM | POA: Diagnosis not present

## 2022-12-11 DIAGNOSIS — Z8781 Personal history of (healed) traumatic fracture: Secondary | ICD-10-CM | POA: Diagnosis not present

## 2022-12-11 DIAGNOSIS — I10 Essential (primary) hypertension: Secondary | ICD-10-CM | POA: Diagnosis not present

## 2022-12-11 DIAGNOSIS — I69818 Other symptoms and signs involving cognitive functions following other cerebrovascular disease: Secondary | ICD-10-CM | POA: Diagnosis not present

## 2022-12-11 DIAGNOSIS — E119 Type 2 diabetes mellitus without complications: Secondary | ICD-10-CM | POA: Diagnosis not present

## 2022-12-12 DIAGNOSIS — I69818 Other symptoms and signs involving cognitive functions following other cerebrovascular disease: Secondary | ICD-10-CM | POA: Diagnosis not present

## 2022-12-12 DIAGNOSIS — J690 Pneumonitis due to inhalation of food and vomit: Secondary | ICD-10-CM | POA: Diagnosis not present

## 2022-12-12 DIAGNOSIS — F0153 Vascular dementia, unspecified severity, with mood disturbance: Secondary | ICD-10-CM | POA: Diagnosis not present

## 2022-12-12 DIAGNOSIS — I10 Essential (primary) hypertension: Secondary | ICD-10-CM | POA: Diagnosis not present

## 2022-12-12 DIAGNOSIS — E119 Type 2 diabetes mellitus without complications: Secondary | ICD-10-CM | POA: Diagnosis not present

## 2022-12-12 DIAGNOSIS — Z8781 Personal history of (healed) traumatic fracture: Secondary | ICD-10-CM | POA: Diagnosis not present

## 2022-12-13 DIAGNOSIS — I69818 Other symptoms and signs involving cognitive functions following other cerebrovascular disease: Secondary | ICD-10-CM | POA: Diagnosis not present

## 2022-12-13 DIAGNOSIS — I10 Essential (primary) hypertension: Secondary | ICD-10-CM | POA: Diagnosis not present

## 2022-12-13 DIAGNOSIS — E119 Type 2 diabetes mellitus without complications: Secondary | ICD-10-CM | POA: Diagnosis not present

## 2022-12-13 DIAGNOSIS — F0153 Vascular dementia, unspecified severity, with mood disturbance: Secondary | ICD-10-CM | POA: Diagnosis not present

## 2022-12-13 DIAGNOSIS — Z8781 Personal history of (healed) traumatic fracture: Secondary | ICD-10-CM | POA: Diagnosis not present

## 2022-12-13 DIAGNOSIS — J690 Pneumonitis due to inhalation of food and vomit: Secondary | ICD-10-CM | POA: Diagnosis not present

## 2022-12-14 DIAGNOSIS — F0153 Vascular dementia, unspecified severity, with mood disturbance: Secondary | ICD-10-CM | POA: Diagnosis not present

## 2022-12-14 DIAGNOSIS — J690 Pneumonitis due to inhalation of food and vomit: Secondary | ICD-10-CM | POA: Diagnosis not present

## 2022-12-14 DIAGNOSIS — E119 Type 2 diabetes mellitus without complications: Secondary | ICD-10-CM | POA: Diagnosis not present

## 2022-12-14 DIAGNOSIS — I69818 Other symptoms and signs involving cognitive functions following other cerebrovascular disease: Secondary | ICD-10-CM | POA: Diagnosis not present

## 2022-12-14 DIAGNOSIS — I10 Essential (primary) hypertension: Secondary | ICD-10-CM | POA: Diagnosis not present

## 2022-12-14 DIAGNOSIS — Z8781 Personal history of (healed) traumatic fracture: Secondary | ICD-10-CM | POA: Diagnosis not present

## 2022-12-15 DIAGNOSIS — I69818 Other symptoms and signs involving cognitive functions following other cerebrovascular disease: Secondary | ICD-10-CM | POA: Diagnosis not present

## 2022-12-15 DIAGNOSIS — E119 Type 2 diabetes mellitus without complications: Secondary | ICD-10-CM | POA: Diagnosis not present

## 2022-12-15 DIAGNOSIS — I10 Essential (primary) hypertension: Secondary | ICD-10-CM | POA: Diagnosis not present

## 2022-12-15 DIAGNOSIS — J690 Pneumonitis due to inhalation of food and vomit: Secondary | ICD-10-CM | POA: Diagnosis not present

## 2022-12-15 DIAGNOSIS — Z8781 Personal history of (healed) traumatic fracture: Secondary | ICD-10-CM | POA: Diagnosis not present

## 2022-12-15 DIAGNOSIS — F0153 Vascular dementia, unspecified severity, with mood disturbance: Secondary | ICD-10-CM | POA: Diagnosis not present

## 2022-12-16 DIAGNOSIS — I69818 Other symptoms and signs involving cognitive functions following other cerebrovascular disease: Secondary | ICD-10-CM | POA: Diagnosis not present

## 2022-12-16 DIAGNOSIS — J690 Pneumonitis due to inhalation of food and vomit: Secondary | ICD-10-CM | POA: Diagnosis not present

## 2022-12-16 DIAGNOSIS — E119 Type 2 diabetes mellitus without complications: Secondary | ICD-10-CM | POA: Diagnosis not present

## 2022-12-16 DIAGNOSIS — Z8781 Personal history of (healed) traumatic fracture: Secondary | ICD-10-CM | POA: Diagnosis not present

## 2022-12-16 DIAGNOSIS — I10 Essential (primary) hypertension: Secondary | ICD-10-CM | POA: Diagnosis not present

## 2022-12-16 DIAGNOSIS — F0153 Vascular dementia, unspecified severity, with mood disturbance: Secondary | ICD-10-CM | POA: Diagnosis not present

## 2022-12-17 DIAGNOSIS — I10 Essential (primary) hypertension: Secondary | ICD-10-CM | POA: Diagnosis not present

## 2022-12-17 DIAGNOSIS — J690 Pneumonitis due to inhalation of food and vomit: Secondary | ICD-10-CM | POA: Diagnosis not present

## 2022-12-17 DIAGNOSIS — F0153 Vascular dementia, unspecified severity, with mood disturbance: Secondary | ICD-10-CM | POA: Diagnosis not present

## 2022-12-17 DIAGNOSIS — E119 Type 2 diabetes mellitus without complications: Secondary | ICD-10-CM | POA: Diagnosis not present

## 2022-12-17 DIAGNOSIS — I69818 Other symptoms and signs involving cognitive functions following other cerebrovascular disease: Secondary | ICD-10-CM | POA: Diagnosis not present

## 2022-12-17 DIAGNOSIS — Z8781 Personal history of (healed) traumatic fracture: Secondary | ICD-10-CM | POA: Diagnosis not present

## 2022-12-18 DIAGNOSIS — I10 Essential (primary) hypertension: Secondary | ICD-10-CM | POA: Diagnosis not present

## 2022-12-18 DIAGNOSIS — Z8781 Personal history of (healed) traumatic fracture: Secondary | ICD-10-CM | POA: Diagnosis not present

## 2022-12-18 DIAGNOSIS — E119 Type 2 diabetes mellitus without complications: Secondary | ICD-10-CM | POA: Diagnosis not present

## 2022-12-18 DIAGNOSIS — J690 Pneumonitis due to inhalation of food and vomit: Secondary | ICD-10-CM | POA: Diagnosis not present

## 2022-12-18 DIAGNOSIS — I69818 Other symptoms and signs involving cognitive functions following other cerebrovascular disease: Secondary | ICD-10-CM | POA: Diagnosis not present

## 2022-12-18 DIAGNOSIS — F0153 Vascular dementia, unspecified severity, with mood disturbance: Secondary | ICD-10-CM | POA: Diagnosis not present

## 2022-12-19 DIAGNOSIS — Z8781 Personal history of (healed) traumatic fracture: Secondary | ICD-10-CM | POA: Diagnosis not present

## 2022-12-19 DIAGNOSIS — F0153 Vascular dementia, unspecified severity, with mood disturbance: Secondary | ICD-10-CM | POA: Diagnosis not present

## 2022-12-19 DIAGNOSIS — I69818 Other symptoms and signs involving cognitive functions following other cerebrovascular disease: Secondary | ICD-10-CM | POA: Diagnosis not present

## 2022-12-19 DIAGNOSIS — I10 Essential (primary) hypertension: Secondary | ICD-10-CM | POA: Diagnosis not present

## 2022-12-19 DIAGNOSIS — E119 Type 2 diabetes mellitus without complications: Secondary | ICD-10-CM | POA: Diagnosis not present

## 2022-12-19 DIAGNOSIS — J690 Pneumonitis due to inhalation of food and vomit: Secondary | ICD-10-CM | POA: Diagnosis not present

## 2022-12-20 DIAGNOSIS — I69818 Other symptoms and signs involving cognitive functions following other cerebrovascular disease: Secondary | ICD-10-CM | POA: Diagnosis not present

## 2022-12-20 DIAGNOSIS — E119 Type 2 diabetes mellitus without complications: Secondary | ICD-10-CM | POA: Diagnosis not present

## 2022-12-20 DIAGNOSIS — Z8781 Personal history of (healed) traumatic fracture: Secondary | ICD-10-CM | POA: Diagnosis not present

## 2022-12-20 DIAGNOSIS — I10 Essential (primary) hypertension: Secondary | ICD-10-CM | POA: Diagnosis not present

## 2022-12-20 DIAGNOSIS — J690 Pneumonitis due to inhalation of food and vomit: Secondary | ICD-10-CM | POA: Diagnosis not present

## 2022-12-20 DIAGNOSIS — F0153 Vascular dementia, unspecified severity, with mood disturbance: Secondary | ICD-10-CM | POA: Diagnosis not present

## 2022-12-21 DIAGNOSIS — I10 Essential (primary) hypertension: Secondary | ICD-10-CM | POA: Diagnosis not present

## 2022-12-21 DIAGNOSIS — Z8781 Personal history of (healed) traumatic fracture: Secondary | ICD-10-CM | POA: Diagnosis not present

## 2022-12-21 DIAGNOSIS — J690 Pneumonitis due to inhalation of food and vomit: Secondary | ICD-10-CM | POA: Diagnosis not present

## 2022-12-21 DIAGNOSIS — E119 Type 2 diabetes mellitus without complications: Secondary | ICD-10-CM | POA: Diagnosis not present

## 2022-12-21 DIAGNOSIS — F0153 Vascular dementia, unspecified severity, with mood disturbance: Secondary | ICD-10-CM | POA: Diagnosis not present

## 2022-12-21 DIAGNOSIS — I69818 Other symptoms and signs involving cognitive functions following other cerebrovascular disease: Secondary | ICD-10-CM | POA: Diagnosis not present

## 2022-12-22 DIAGNOSIS — Z8781 Personal history of (healed) traumatic fracture: Secondary | ICD-10-CM | POA: Diagnosis not present

## 2022-12-22 DIAGNOSIS — J690 Pneumonitis due to inhalation of food and vomit: Secondary | ICD-10-CM | POA: Diagnosis not present

## 2022-12-22 DIAGNOSIS — F0153 Vascular dementia, unspecified severity, with mood disturbance: Secondary | ICD-10-CM | POA: Diagnosis not present

## 2022-12-22 DIAGNOSIS — I69818 Other symptoms and signs involving cognitive functions following other cerebrovascular disease: Secondary | ICD-10-CM | POA: Diagnosis not present

## 2022-12-22 DIAGNOSIS — I10 Essential (primary) hypertension: Secondary | ICD-10-CM | POA: Diagnosis not present

## 2022-12-22 DIAGNOSIS — E119 Type 2 diabetes mellitus without complications: Secondary | ICD-10-CM | POA: Diagnosis not present

## 2022-12-23 DIAGNOSIS — I10 Essential (primary) hypertension: Secondary | ICD-10-CM | POA: Diagnosis not present

## 2022-12-23 DIAGNOSIS — Z8781 Personal history of (healed) traumatic fracture: Secondary | ICD-10-CM | POA: Diagnosis not present

## 2022-12-23 DIAGNOSIS — I69818 Other symptoms and signs involving cognitive functions following other cerebrovascular disease: Secondary | ICD-10-CM | POA: Diagnosis not present

## 2022-12-23 DIAGNOSIS — J690 Pneumonitis due to inhalation of food and vomit: Secondary | ICD-10-CM | POA: Diagnosis not present

## 2022-12-23 DIAGNOSIS — E119 Type 2 diabetes mellitus without complications: Secondary | ICD-10-CM | POA: Diagnosis not present

## 2022-12-23 DIAGNOSIS — F0153 Vascular dementia, unspecified severity, with mood disturbance: Secondary | ICD-10-CM | POA: Diagnosis not present

## 2022-12-24 ENCOUNTER — Non-Acute Institutional Stay (SKILLED_NURSING_FACILITY): Payer: Medicare Other | Admitting: Orthopedic Surgery

## 2022-12-24 ENCOUNTER — Encounter: Payer: Self-pay | Admitting: Orthopedic Surgery

## 2022-12-24 DIAGNOSIS — F419 Anxiety disorder, unspecified: Secondary | ICD-10-CM | POA: Diagnosis not present

## 2022-12-24 DIAGNOSIS — F0153 Vascular dementia, unspecified severity, with mood disturbance: Secondary | ICD-10-CM | POA: Diagnosis not present

## 2022-12-24 DIAGNOSIS — F339 Major depressive disorder, recurrent, unspecified: Secondary | ICD-10-CM

## 2022-12-24 DIAGNOSIS — I10 Essential (primary) hypertension: Secondary | ICD-10-CM | POA: Diagnosis not present

## 2022-12-24 DIAGNOSIS — Z8781 Personal history of (healed) traumatic fracture: Secondary | ICD-10-CM | POA: Diagnosis not present

## 2022-12-24 DIAGNOSIS — E1165 Type 2 diabetes mellitus with hyperglycemia: Secondary | ICD-10-CM

## 2022-12-24 DIAGNOSIS — E039 Hypothyroidism, unspecified: Secondary | ICD-10-CM

## 2022-12-24 DIAGNOSIS — F03918 Unspecified dementia, unspecified severity, with other behavioral disturbance: Secondary | ICD-10-CM

## 2022-12-24 DIAGNOSIS — E119 Type 2 diabetes mellitus without complications: Secondary | ICD-10-CM | POA: Diagnosis not present

## 2022-12-24 DIAGNOSIS — I69818 Other symptoms and signs involving cognitive functions following other cerebrovascular disease: Secondary | ICD-10-CM | POA: Diagnosis not present

## 2022-12-24 DIAGNOSIS — J690 Pneumonitis due to inhalation of food and vomit: Secondary | ICD-10-CM | POA: Diagnosis not present

## 2022-12-24 NOTE — Progress Notes (Signed)
Location:  [facility] Room Number: 39/A Place of Service:  SNF [phone]) Provider:  Octavia Heir, NP   Mahlon Gammon, MD  Patient Care Team: Mahlon Gammon, MD as PCP - General (Internal Medicine)  Extended Emergency Contact Information Primary Emergency Contact: Harris,Margaret Address: [address]          [place], [place] [address] Darden Amber of [place] Home Phone: [phone] Mobile Phone: [phone] Relation: Daughter  Code Status:  DNR Goals of care: Advanced Directive information    11/30/2022    2:43 PM  Advanced Directives  Does Patient Have a Medical Advance Directive? Yes  Type of Estate agent of [place];Out of facility DNR (pink MOST or yellow form);Living will  Does patient want to make changes to medical advance directive? No - Patient declined  Copy of Healthcare Power of Attorney in Chart? Yes - validated most recent copy scanned in chart (See row information)     Chief Complaint  Patient presents with   Medical Management of Chronic Issues    HPI:  Pt is a 89 y.o. female seen today for medical management of chronic diseases.    She currently resides on the skilled nursing unit at [facility]. Past medical includes: HTN, constipation, T2DM, hypothyroidism, OA, urinary retention, vascular dementia, depression and anxiety.     Dementia- followed by hospice> enrolled 08/2022, CT head 04/2020 age related atrophy and chronic small vessel ischemia, no behaviors, dependent with ADLs except feeding, ? Dysphagia> doing well with small portions, remains on Depakote and Seroquel  T2DM- A1c 7.2 (03/19)> was 6.5 (12/22), no hypoglycemic events, diet controlled Hypothyroidism- TSH 2.18 07/02/2022, remains on levothyroxine Depression/anxiety- good family support, remains on Zoloft, Na+ 137 08/31/2022, off Ativan at this time  HTN- BUN/creat 16/0.7 08/31/2022, remains on metoprolol  10/2022 BIMS score 2/15.   No recent  falls or injuries.   Left finger paronychia healed.   Recent weights:  10/01- 152.4 lbs  09/02- 153.8 lbs  08/01- 148 lbs   Recent blood pressures:  10/22- 135/72  10/15- 131/81  10/08- 119/75   Past Medical History:  Diagnosis Date   Burst fracture of lumbar vertebra, sequela 12/27/2019   Closed left hip fracture (HCC) 04/07/2020   Diabetes mellitus type 2 in obese 11/09/2017   12/13/19 Hgb a1c 6.8, Na 141, K 4.3, Bun 16, creat 0.78, eGFR 67, wbc 9.6, Hgb 12.6, plt 318, neutrophils 60.9%, LDL 51  01/10/20 wbc 14.3, Hgb 13.6, plt 523, neutrophils 70.5%, Na 142, K 3.3, Bun 27, creat 0.62, eGFR 80  01/31/20 eye exam when the patient is able.   01/08/21 Hgb a1c 5.5   Fall as cause of accidental injury in residential institution as place of occurrence 04/07/2020   Hearing loss    History of fracture of clavicle    Hypokalemia 01/21/2020   01/21/20 Na 141, K 4.1, Bun 12, creat 0.65, eGFR 79, wbc 9.2, Hgb 12.7, plt 331   Hypothyroidism    Insomnia    Right lower lobe pneumonia 01/14/2020   Tachycardia 04/03/2019   Tinnitus    Unspecified fracture of the lower end of left radius, initial encounter for closed fracture 04/07/2020   Past Surgical History:  Procedure Laterality Date   CHOLECYSTECTOMY  2007   Dr. Alan Mulder   CLAVICLE EXCISION  1986   INTRAMEDULLARY (IM) NAIL INTERTROCHANTERIC Left 04/09/2020   Procedure: LEFT INTERTROCHANTERIC INTRAMEDULLARY (IM) NAIL;  Surgeon: Tarry Kos, MD;  Location: [facility] OR;  Service: Orthopedics;  Laterality: Left;   MOHS SURGERY     past 10 years chest & legs   OPEN REDUCTION INTERNAL FIXATION (ORIF) DISTAL RADIAL FRACTURE Left 04/09/2020   Procedure: CLOSED REDUCTION LEFT DISTAL RADIUS FRACTURE;  Surgeon: Tarry Kos, MD;  Location: [facility] OR;  Service: Orthopedics;  Laterality: Left;    Allergies  Allergen Reactions   Metformin And Related Diarrhea    Outpatient Encounter Medications as of 12/24/2022  Medication Sig   acetaminophen (TYLENOL)  325 MG tablet Take 650 mg by mouth 3 (three) times daily as needed.   DIMETHICONE, TOPICAL, (CERAVE BABY) 1 % LOTN Apply 1 Application topically daily. Apply to shoulders, back, and chest.   divalproex (DEPAKOTE SPRINKLE) 125 MG capsule Take 1 capsule (125 mg total) by mouth daily.   docusate sodium (COLACE) 100 MG capsule Take 100 mg by mouth daily.   Emollient (CERAVE) LOTN Apply 1 Application topically as directed. Apply to back and chest topically every shift every 2 days for dry skin.   Emollient (CERAVE) LOTN Apply 1 Application topically daily. Apply to back and chest.   Glucerna (GLUCERNA) LIQD Take 240 mLs by mouth as needed.   hydrocortisone cream 1 % Apply 1 Application topically 2 (two) times daily as needed for itching.   ipratropium-albuterol (DUONEB) 0.5-2.5 (3) MG/3ML SOLN Take 3 mLs by nebulization every 6 (six) hours as needed.   levothyroxine (SYNTHROID) 75 MCG tablet TAKE 1 TABLET (75 MCG TOTAL) BY MOUTH DAILY BEFORE BREAKFAST.   metoprolol tartrate (LOPRESSOR) 25 MG tablet Take 12.5 mg by mouth 2 (two) times daily.   nystatin powder Apply 1 Application topically daily as needed.   ondansetron (ZOFRAN) 4 MG tablet Take 1 tablet (4 mg total) by mouth 2 (two) times daily as needed for nausea or vomiting.   OXYGEN 2lpm as needed   polyethylene glycol (MIRALAX / GLYCOLAX) 17 g packet Take 17 g by mouth daily as needed for moderate constipation.   QUEtiapine (SEROQUEL) 25 MG tablet Take 37.5 mg by mouth daily at 2 PM. Hold 09/13- 09/18 while on paxlovid   sertraline (ZOLOFT) 50 MG tablet Take 75 mg by mouth daily.   Sodium Fluoride (PREVIDENT 5000 BOOSTER PLUS) 1.1 % PSTE Place 1 Application onto teeth in the morning and at bedtime.   zinc oxide 20 % ointment Apply 1 application topically as needed for irritation.   [DISCONTINUED] divalproex (DEPAKOTE) 125 MG DR tablet Take 125 mg by mouth daily.   No facility-administered encounter medications on file as of 12/24/2022.     Review of Systems  Unable to perform ROS: Dementia    Immunization History  Administered Date(s) Administered   DTaP 07/20/2013   Fluad Quad(high Dose 65+) 12/23/2021   Influenza, High Dose Seasonal PF 12/07/2016   Influenza,inj,Quad PF,6+ Mos 12/01/2017   Influenza-Unspecified 11/20/2014, 12/12/2015, 11/20/2019   Moderna SARS-COV2 Booster Vaccination 08/06/2020   Moderna Sars-Covid-2 Vaccination 03/05/2019, 04/02/2019   PFIZER(Purple Top)SARS-COV-2 Vaccination 04/01/2020, 08/06/2020, 11/19/2020, 01/05/2022   PPD Test 08/06/2014   Pneumococcal Conjugate-13 11/07/2013   Pneumococcal Polysaccharide-23 03/22/2017   Tetanus 03/02/2015   Zoster Recombinant(Shingrix) 06/07/2017, 08/26/2017   Zoster, Live 03/01/2006   Pertinent  Health Maintenance Due  Topic Date Due   INFLUENZA VACCINE  09/30/2022   HEMOGLOBIN A1C  11/18/2022   OPHTHALMOLOGY EXAM  11/19/2022   FOOT EXAM  10/22/2023   DEXA SCAN  Completed      09/12/2020   12:07 PM 09/28/2021   10:30 AM 05/19/2022  12:28 PM 09/10/2022    2:05 PM 10/25/2022    9:27 AM  Fall Risk  Falls in the past year? 1 1 1  0 0  Was there an injury with Fall? 1 0 0 0 0  Fall Risk Category Calculator 3 2 1  0 0  Fall Risk Category (Retired) High Moderate     (RETIRED) Patient Fall Risk Level  Moderate fall risk     Patient at Risk for Falls Due to History of fall(s);Impaired balance/gait;Impaired mobility;Mental status change History of fall(s);Impaired balance/gait;Impaired mobility History of fall(s);Impaired balance/gait;Impaired mobility History of fall(s);Impaired balance/gait;Impaired mobility History of fall(s);Impaired balance/gait;Impaired mobility  Fall risk Follow up Falls evaluation completed;Education provided;Falls prevention discussed Falls evaluation completed;Education provided;Falls prevention discussed Falls evaluation completed;Education provided;Falls prevention discussed Falls evaluation completed;Education provided;Falls  prevention discussed Falls evaluation completed;Education provided;Falls prevention discussed   Functional Status Survey:    Vitals:   12/24/22 1355  BP: 135/72  Pulse: 86  Resp: 18  Temp: (!) 96.9 F (36.1 C)  SpO2: 96%  Weight: 152 lb 6.4 oz (69.1 kg)  Height: 5' (1.524 m)   Body mass index is 29.76 kg/m. Physical Exam Vitals reviewed.  Constitutional:      General: She is not in acute distress. HENT:     Head: Normocephalic.     Right Ear: There is no impacted cerumen.     Left Ear: There is no impacted cerumen.     Ears:     Comments: Left hearing aid    Nose: Nose normal.     Mouth/Throat:     Mouth: Mucous membranes are moist.  Eyes:     General:        Right eye: No discharge.        Left eye: No discharge.  Cardiovascular:     Rate and Rhythm: Normal rate and regular rhythm.     Pulses: Normal pulses.     Heart sounds: Normal heart sounds.  Pulmonary:     Effort: Pulmonary effort is normal. No respiratory distress.     Breath sounds: Normal breath sounds. No wheezing.  Abdominal:     General: Bowel sounds are normal.     Palpations: Abdomen is soft.  Musculoskeletal:     Cervical back: Neck supple.     Right lower leg: No edema.     Left lower leg: No edema.  Skin:    General: Skin is warm.     Capillary Refill: Capillary refill takes less than 2 seconds.  Neurological:     Mental Status: She is alert.     Motor: Weakness present.     Gait: Gait abnormal.     Comments: Hoyer/wheelchair  Psychiatric:        Mood and Affect: Mood normal.     Labs reviewed: Recent Labs    04/23/22 0000 05/18/22 0000 08/31/22 0000  NA 137 142 137  K 4.4 4.4 3.9  CL 101 105 102  CO2 26* 28* 27*  BUN 22* 23* 16  CREATININE 0.7 0.7 0.7  CALCIUM 9.2 8.7 9.1   Recent Labs    04/23/22 0000 05/18/22 0000  AST 14 11*  ALT 10 13  ALKPHOS 95 100  ALBUMIN 3.8 3.4*   Recent Labs    04/23/22 0000 05/18/22 0000 08/31/22 0000  WBC 10.3 9.8 9.6  NEUTROABS  6,489.00  --  7,085.00  HGB 13.4 12.4 12.6  HCT 41 38 39  PLT 280 259 262   Lab Results  Component Value Date   TSH 2.18 07/02/2022   Lab Results  Component Value Date   HGBA1C 7.2 05/18/2022   Lab Results  Component Value Date   CHOL 158 05/18/2022   HDL 37 05/18/2022   LDLCALC 93 05/18/2022   TRIG 187 (A) 05/18/2022   CHOLHDL 3.0 12/13/2019    Significant Diagnostic Results in last 30 days:  No results found.  Assessment/Plan 1. Dementia with behavioral disturbance (HCC) - stable with Depakote (recently decreased) and Seroquel - enrolled with Hospice - nonambulatory - cont skilled nursing   2. Type 2 diabetes mellitus with hyperglycemia, without long-term current use of insulin (HCC) - A1c stable - no hypoglycemia - diet controlled> metformin allergy - A1c 10/28  3. Acquired hypothyroidism - TSH stable - cont levothyroxine  4. Recurrent depression (HCC) - no mood changes - cont Zoloft  5. Anxiety - stable with Zoloft  6. Primary hypertension - controlled with metoprolol    Family/ staff Communication: plan discussed with patient and nurse  Labs/tests ordered:  A1c 10/28

## 2022-12-25 DIAGNOSIS — Z8781 Personal history of (healed) traumatic fracture: Secondary | ICD-10-CM | POA: Diagnosis not present

## 2022-12-25 DIAGNOSIS — E119 Type 2 diabetes mellitus without complications: Secondary | ICD-10-CM | POA: Diagnosis not present

## 2022-12-25 DIAGNOSIS — J690 Pneumonitis due to inhalation of food and vomit: Secondary | ICD-10-CM | POA: Diagnosis not present

## 2022-12-25 DIAGNOSIS — F0153 Vascular dementia, unspecified severity, with mood disturbance: Secondary | ICD-10-CM | POA: Diagnosis not present

## 2022-12-25 DIAGNOSIS — I10 Essential (primary) hypertension: Secondary | ICD-10-CM | POA: Diagnosis not present

## 2022-12-25 DIAGNOSIS — I69818 Other symptoms and signs involving cognitive functions following other cerebrovascular disease: Secondary | ICD-10-CM | POA: Diagnosis not present

## 2022-12-26 DIAGNOSIS — I69818 Other symptoms and signs involving cognitive functions following other cerebrovascular disease: Secondary | ICD-10-CM | POA: Diagnosis not present

## 2022-12-26 DIAGNOSIS — F0153 Vascular dementia, unspecified severity, with mood disturbance: Secondary | ICD-10-CM | POA: Diagnosis not present

## 2022-12-26 DIAGNOSIS — E119 Type 2 diabetes mellitus without complications: Secondary | ICD-10-CM | POA: Diagnosis not present

## 2022-12-26 DIAGNOSIS — Z8781 Personal history of (healed) traumatic fracture: Secondary | ICD-10-CM | POA: Diagnosis not present

## 2022-12-26 DIAGNOSIS — I10 Essential (primary) hypertension: Secondary | ICD-10-CM | POA: Diagnosis not present

## 2022-12-26 DIAGNOSIS — J690 Pneumonitis due to inhalation of food and vomit: Secondary | ICD-10-CM | POA: Diagnosis not present

## 2022-12-27 DIAGNOSIS — F0153 Vascular dementia, unspecified severity, with mood disturbance: Secondary | ICD-10-CM | POA: Diagnosis not present

## 2022-12-27 DIAGNOSIS — R7309 Other abnormal glucose: Secondary | ICD-10-CM | POA: Diagnosis not present

## 2022-12-27 DIAGNOSIS — Z8781 Personal history of (healed) traumatic fracture: Secondary | ICD-10-CM | POA: Diagnosis not present

## 2022-12-27 DIAGNOSIS — I10 Essential (primary) hypertension: Secondary | ICD-10-CM | POA: Diagnosis not present

## 2022-12-27 DIAGNOSIS — I69818 Other symptoms and signs involving cognitive functions following other cerebrovascular disease: Secondary | ICD-10-CM | POA: Diagnosis not present

## 2022-12-27 DIAGNOSIS — E119 Type 2 diabetes mellitus without complications: Secondary | ICD-10-CM | POA: Diagnosis not present

## 2022-12-27 DIAGNOSIS — J690 Pneumonitis due to inhalation of food and vomit: Secondary | ICD-10-CM | POA: Diagnosis not present

## 2022-12-27 LAB — CBC AND DIFFERENTIAL: Hemoglobin: 12.3 (ref 12.0–16.0)

## 2022-12-28 DIAGNOSIS — F0153 Vascular dementia, unspecified severity, with mood disturbance: Secondary | ICD-10-CM | POA: Diagnosis not present

## 2022-12-28 DIAGNOSIS — I10 Essential (primary) hypertension: Secondary | ICD-10-CM | POA: Diagnosis not present

## 2022-12-28 DIAGNOSIS — J690 Pneumonitis due to inhalation of food and vomit: Secondary | ICD-10-CM | POA: Diagnosis not present

## 2022-12-28 DIAGNOSIS — Z8781 Personal history of (healed) traumatic fracture: Secondary | ICD-10-CM | POA: Diagnosis not present

## 2022-12-28 DIAGNOSIS — I69818 Other symptoms and signs involving cognitive functions following other cerebrovascular disease: Secondary | ICD-10-CM | POA: Diagnosis not present

## 2022-12-28 DIAGNOSIS — E119 Type 2 diabetes mellitus without complications: Secondary | ICD-10-CM | POA: Diagnosis not present

## 2022-12-29 DIAGNOSIS — F0153 Vascular dementia, unspecified severity, with mood disturbance: Secondary | ICD-10-CM | POA: Diagnosis not present

## 2022-12-29 DIAGNOSIS — I69818 Other symptoms and signs involving cognitive functions following other cerebrovascular disease: Secondary | ICD-10-CM | POA: Diagnosis not present

## 2022-12-29 DIAGNOSIS — J690 Pneumonitis due to inhalation of food and vomit: Secondary | ICD-10-CM | POA: Diagnosis not present

## 2022-12-29 DIAGNOSIS — Z8781 Personal history of (healed) traumatic fracture: Secondary | ICD-10-CM | POA: Diagnosis not present

## 2022-12-29 DIAGNOSIS — E119 Type 2 diabetes mellitus without complications: Secondary | ICD-10-CM | POA: Diagnosis not present

## 2022-12-29 DIAGNOSIS — I10 Essential (primary) hypertension: Secondary | ICD-10-CM | POA: Diagnosis not present

## 2022-12-30 DIAGNOSIS — Z8781 Personal history of (healed) traumatic fracture: Secondary | ICD-10-CM | POA: Diagnosis not present

## 2022-12-30 DIAGNOSIS — I10 Essential (primary) hypertension: Secondary | ICD-10-CM | POA: Diagnosis not present

## 2022-12-30 DIAGNOSIS — F0153 Vascular dementia, unspecified severity, with mood disturbance: Secondary | ICD-10-CM | POA: Diagnosis not present

## 2022-12-30 DIAGNOSIS — J690 Pneumonitis due to inhalation of food and vomit: Secondary | ICD-10-CM | POA: Diagnosis not present

## 2022-12-30 DIAGNOSIS — E119 Type 2 diabetes mellitus without complications: Secondary | ICD-10-CM | POA: Diagnosis not present

## 2022-12-30 DIAGNOSIS — I69818 Other symptoms and signs involving cognitive functions following other cerebrovascular disease: Secondary | ICD-10-CM | POA: Diagnosis not present

## 2022-12-31 ENCOUNTER — Non-Acute Institutional Stay (SKILLED_NURSING_FACILITY): Payer: Medicare Other | Admitting: Orthopedic Surgery

## 2022-12-31 ENCOUNTER — Encounter: Payer: Self-pay | Admitting: Orthopedic Surgery

## 2022-12-31 DIAGNOSIS — I69818 Other symptoms and signs involving cognitive functions following other cerebrovascular disease: Secondary | ICD-10-CM | POA: Diagnosis not present

## 2022-12-31 DIAGNOSIS — E1165 Type 2 diabetes mellitus with hyperglycemia: Secondary | ICD-10-CM | POA: Diagnosis not present

## 2022-12-31 DIAGNOSIS — Z6829 Body mass index (BMI) 29.0-29.9, adult: Secondary | ICD-10-CM | POA: Diagnosis not present

## 2022-12-31 DIAGNOSIS — J690 Pneumonitis due to inhalation of food and vomit: Secondary | ICD-10-CM | POA: Diagnosis not present

## 2022-12-31 DIAGNOSIS — I10 Essential (primary) hypertension: Secondary | ICD-10-CM | POA: Diagnosis not present

## 2022-12-31 DIAGNOSIS — E119 Type 2 diabetes mellitus without complications: Secondary | ICD-10-CM | POA: Diagnosis not present

## 2022-12-31 DIAGNOSIS — F0153 Vascular dementia, unspecified severity, with mood disturbance: Secondary | ICD-10-CM | POA: Diagnosis not present

## 2022-12-31 DIAGNOSIS — E039 Hypothyroidism, unspecified: Secondary | ICD-10-CM | POA: Diagnosis not present

## 2022-12-31 DIAGNOSIS — Z8781 Personal history of (healed) traumatic fracture: Secondary | ICD-10-CM | POA: Diagnosis not present

## 2022-12-31 LAB — HEMOGLOBIN A1C: Hemoglobin A1C: 8.8

## 2022-12-31 NOTE — Progress Notes (Signed)
Location:  [facility] Room Number: 39/A Place of Service:  SNF [phone]) Provider:  Octavia Heir, NP   Mahlon Gammon, MD  Patient Care Team: Mahlon Gammon, MD as PCP - General (Internal Medicine)  Extended Emergency Contact Information Primary Emergency Contact: Harris,Margaret Address: [address]          [place], [place] [address] D[place] of [place] Home Phone: [phone] Mobile Phone: [phone] Relation: Daughter  Code Status:  DNR Goals of care: Advanced Directive information    11/30/2022    2:43 PM  Advanced Directives  Does Patient Have a Medical Advance Directive? Yes  Type of Estate agent of [place];Out of facility DNR (pink MOST or yellow form);Living will  Does patient want to make changes to medical advance directive? No - Patient declined  Copy of Healthcare Power of Attorney in Chart? Yes - validated most recent copy scanned in chart (See row information)     Chief Complaint  Patient presents with   Acute Visit    Elevated A1c    HPI:  Pt is a 89 y.o. female seen today for acute visit due to elevated A1c.   She currently resides on the skilled nursing unit at [facility]. Past medical includes: HTN, constipation, T2DM, hypothyroidism, OA, urinary retention, vascular dementia, depression and anxiety.    Followed by hospice due to advanced dementia.   A1c 8.8 (10/31)> was 7.2 (03/19)> 6.5 (12/22). Not on medication. Allergy to metformin. She has gained about 30 lbs within past 18 months. Current weight 152 lbs> was 121 lbs 05/2021. Diet changed to smaller portions. She is receiving Glucerna twice daily. Afebrile. Vitals stable.    Past Medical History:  Diagnosis Date   Burst fracture of lumbar vertebra, sequela 12/27/2019   Closed left hip fracture (HCC) 04/07/2020   Diabetes mellitus type 2 in obese 11/09/2017   12/13/19 Hgb a1c 6.8, Na 141, K 4.3, Bun 16, creat 0.78, eGFR 67, wbc 9.6, Hgb 12.6,  plt 318, neutrophils 60.9%, LDL 51  01/10/20 wbc 14.3, Hgb 13.6, plt 523, neutrophils 70.5%, Na 142, K 3.3, Bun 27, creat 0.62, eGFR 80  01/31/20 eye exam when the patient is able.   01/08/21 Hgb a1c 5.5   Fall as cause of accidental injury in residential institution as place of occurrence 04/07/2020   Hearing loss    History of fracture of clavicle    Hypokalemia 01/21/2020   01/21/20 Na 141, K 4.1, Bun 12, creat 0.65, eGFR 79, wbc 9.2, Hgb 12.7, plt 331   Hypothyroidism    Insomnia    Right lower lobe pneumonia 01/14/2020   Tachycardia 04/03/2019   Tinnitus    Unspecified fracture of the lower end of left radius, initial encounter for closed fracture 04/07/2020   Past Surgical History:  Procedure Laterality Date   CHOLECYSTECTOMY  2007   Dr. Alan Mulder   CLAVICLE EXCISION  1986   INTRAMEDULLARY (IM) NAIL INTERTROCHANTERIC Left 04/09/2020   Procedure: LEFT INTERTROCHANTERIC INTRAMEDULLARY (IM) NAIL;  Surgeon: Tarry Kos, MD;  Location: [facility] OR;  Service: Orthopedics;  Laterality: Left;   MOHS SURGERY     past 10 years chest & legs   OPEN REDUCTION INTERNAL FIXATION (ORIF) DISTAL RADIAL FRACTURE Left 04/09/2020   Procedure: CLOSED REDUCTION LEFT DISTAL RADIUS FRACTURE;  Surgeon: Tarry Kos, MD;  Location: [facility] OR;  Service: Orthopedics;  Laterality: Left;    Allergies  Allergen Reactions   Metformin And Related Diarrhea  Outpatient Encounter Medications as of 12/31/2022  Medication Sig   acetaminophen (TYLENOL) 325 MG tablet Take 650 mg by mouth 3 (three) times daily as needed.   DIMETHICONE, TOPICAL, (CERAVE BABY) 1 % LOTN Apply 1 Application topically daily. Apply to shoulders, back, and chest.   divalproex (DEPAKOTE SPRINKLE) 125 MG capsule Take 1 capsule (125 mg total) by mouth daily.   docusate sodium (COLACE) 100 MG capsule Take 100 mg by mouth daily.   Emollient (CERAVE) LOTN Apply 1 Application topically as directed. Apply to back and chest topically every shift every 2 days for  dry skin.   Emollient (CERAVE) LOTN Apply 1 Application topically daily. Apply to back and chest.   Glucerna (GLUCERNA) LIQD Take 240 mLs by mouth as needed.   hydrocortisone cream 1 % Apply 1 Application topically 2 (two) times daily as needed for itching.   ipratropium-albuterol (DUONEB) 0.5-2.5 (3) MG/3ML SOLN Take 3 mLs by nebulization every 6 (six) hours as needed.   levothyroxine (SYNTHROID) 75 MCG tablet TAKE 1 TABLET (75 MCG TOTAL) BY MOUTH DAILY BEFORE BREAKFAST.   metoprolol tartrate (LOPRESSOR) 25 MG tablet Take 12.5 mg by mouth 2 (two) times daily.   nystatin powder Apply 1 Application topically daily as needed.   ondansetron (ZOFRAN) 4 MG tablet Take 1 tablet (4 mg total) by mouth 2 (two) times daily as needed for nausea or vomiting.   OXYGEN 2lpm as needed   polyethylene glycol (MIRALAX / GLYCOLAX) 17 g packet Take 17 g by mouth daily as needed for moderate constipation.   QUEtiapine (SEROQUEL) 25 MG tablet Take 37.5 mg by mouth daily at 2 PM. Hold 09/13- 09/18 while on paxlovid   sertraline (ZOLOFT) 50 MG tablet Take 75 mg by mouth daily.   Sodium Fluoride (PREVIDENT 5000 BOOSTER PLUS) 1.1 % PSTE Place 1 Application onto teeth in the morning and at bedtime.   zinc oxide 20 % ointment Apply 1 application topically as needed for irritation.   [DISCONTINUED] divalproex (DEPAKOTE) 125 MG DR tablet Take 125 mg by mouth daily.   No facility-administered encounter medications on file as of 12/31/2022.    Review of Systems  Unable to perform ROS: Dementia    Immunization History  Administered Date(s) Administered   DTaP 07/20/2013   Fluad Quad(high Dose 65+) 12/23/2021   Influenza, High Dose Seasonal PF 12/07/2016   Influenza,inj,Quad PF,6+ Mos 12/01/2017   Influenza-Unspecified 11/20/2014, 12/12/2015, 11/20/2019   Moderna SARS-COV2 Booster Vaccination 08/06/2020   Moderna Sars-Covid-2 Vaccination 03/05/2019, 04/02/2019   PFIZER(Purple Top)SARS-COV-2 Vaccination 04/01/2020,  08/06/2020, 11/19/2020, 01/05/2022   PPD Test 08/06/2014   Pneumococcal Conjugate-13 11/07/2013   Pneumococcal Polysaccharide-23 03/22/2017   Tetanus 03/02/2015   Zoster Recombinant(Shingrix) 06/07/2017, 08/26/2017   Zoster, Live 03/01/2006   Pertinent  Health Maintenance Due  Topic Date Due   INFLUENZA VACCINE  09/30/2022   HEMOGLOBIN A1C  11/18/2022   OPHTHALMOLOGY EXAM  11/19/2022   FOOT EXAM  12/24/2023   DEXA SCAN  Completed      09/28/2021   10:30 AM 05/19/2022   12:28 PM 09/10/2022    2:05 PM 10/25/2022    9:27 AM 12/24/2022    2:09 PM  Fall Risk  Falls in the past year? 1 1 0 0 1  Was there an injury with Fall? 0 0 0 0 0  Fall Risk Category Calculator 2 1 0 0 1  Fall Risk Category (Retired) Moderate      (RETIRED) Patient Fall Risk Level Moderate fall risk  Patient at Risk for Falls Due to History of fall(s);Impaired balance/gait;Impaired mobility History of fall(s);Impaired balance/gait;Impaired mobility History of fall(s);Impaired balance/gait;Impaired mobility History of fall(s);Impaired balance/gait;Impaired mobility History of fall(s);Impaired balance/gait;Impaired mobility  Fall risk Follow up Falls evaluation completed;Education provided;Falls prevention discussed Falls evaluation completed;Education provided;Falls prevention discussed Falls evaluation completed;Education provided;Falls prevention discussed Falls evaluation completed;Education provided;Falls prevention discussed Falls evaluation completed;Education provided;Falls prevention discussed   Functional Status Survey:    Vitals:   12/31/22 1135  BP: (!) 145/81  Pulse: 97  Resp: 17  Temp: (!) 97.4 F (36.3 C)  SpO2: 97%  Weight: 152 lb 6.4 oz (69.1 kg)  Height: 5' (1.524 m)   Body mass index is 29.76 kg/m. Physical Exam Vitals reviewed.  Constitutional:      General: She is not in acute distress. HENT:     Head: Normocephalic.  Eyes:     General:        Right eye: No discharge.         Left eye: No discharge.  Cardiovascular:     Rate and Rhythm: Normal rate and regular rhythm.     Pulses: Normal pulses.     Heart sounds: Normal heart sounds.  Pulmonary:     Effort: Respiratory distress present.     Breath sounds: Normal breath sounds. No wheezing.  Abdominal:     General: Bowel sounds are normal.     Palpations: Abdomen is soft.  Musculoskeletal:     Cervical back: Neck supple.     Right lower leg: No edema.     Left lower leg: No edema.  Skin:    General: Skin is warm.     Capillary Refill: Capillary refill takes less than 2 seconds.  Neurological:     General: No focal deficit present.     Mental Status: She is alert. Mental status is at baseline.     Motor: Weakness present.     Gait: Gait abnormal.     Comments: Hoyer/wheelchair  Psychiatric:        Mood and Affect: Mood normal.     Labs reviewed: Recent Labs    04/23/22 0000 05/18/22 0000 08/31/22 0000  NA 137 142 137  K 4.4 4.4 3.9  CL 101 105 102  CO2 26* 28* 27*  BUN 22* 23* 16  CREATININE 0.7 0.7 0.7  CALCIUM 9.2 8.7 9.1   Recent Labs    04/23/22 0000 05/18/22 0000  AST 14 11*  ALT 10 13  ALKPHOS 95 100  ALBUMIN 3.8 3.4*   Recent Labs    04/23/22 0000 05/18/22 0000 08/31/22 0000  WBC 10.3 9.8 9.6  NEUTROABS 6,489.00  --  7,085.00  HGB 13.4 12.4 12.6  HCT 41 38 39  PLT 280 259 262   Lab Results  Component Value Date   TSH 2.18 07/02/2022   Lab Results  Component Value Date   HGBA1C 7.2 05/18/2022   Lab Results  Component Value Date   CHOL 158 05/18/2022   HDL 37 05/18/2022   LDLCALC 93 05/18/2022   TRIG 187 (A) 05/18/2022   CHOLHDL 3.0 12/13/2019    Significant Diagnostic Results in last 30 days:  No results found.  Assessment/Plan 1. Type 2 diabetes mellitus with hyperglycemia, without long-term current use of insulin (HCC) - A1c 8.8 (10/31)> was 7.2 (03/19)> 6.5 (12/22) - not on medication - allergy to metformin - suspect associated with weight  gain - check CBG 3x/week for 2 weeks- if consistently > 200 recommend  starting low dose glipizide  2. BMI 29.0-29.9,adult - BMI 29.76 - current weight 152.4 lbs> was 121 05/2021 - sedentary lifestyle - on Depakote - TSH 2.18 06/2022 - eating 3 meals daily> smaller portions since 04/2022 - discontinue Glucerna    Family/ staff Communication: plan discussed with patient and nurse  Labs/tests ordered:  CBG 3x/week for 2 weeks

## 2023-01-05 DIAGNOSIS — Z23 Encounter for immunization: Secondary | ICD-10-CM | POA: Diagnosis not present

## 2023-01-06 DIAGNOSIS — I69818 Other symptoms and signs involving cognitive functions following other cerebrovascular disease: Secondary | ICD-10-CM | POA: Diagnosis not present

## 2023-01-06 DIAGNOSIS — E119 Type 2 diabetes mellitus without complications: Secondary | ICD-10-CM | POA: Diagnosis not present

## 2023-01-06 DIAGNOSIS — Z8781 Personal history of (healed) traumatic fracture: Secondary | ICD-10-CM | POA: Diagnosis not present

## 2023-01-06 DIAGNOSIS — F0153 Vascular dementia, unspecified severity, with mood disturbance: Secondary | ICD-10-CM | POA: Diagnosis not present

## 2023-01-06 DIAGNOSIS — J690 Pneumonitis due to inhalation of food and vomit: Secondary | ICD-10-CM | POA: Diagnosis not present

## 2023-01-06 DIAGNOSIS — I10 Essential (primary) hypertension: Secondary | ICD-10-CM | POA: Diagnosis not present

## 2023-01-10 DIAGNOSIS — E119 Type 2 diabetes mellitus without complications: Secondary | ICD-10-CM | POA: Diagnosis not present

## 2023-01-10 DIAGNOSIS — J690 Pneumonitis due to inhalation of food and vomit: Secondary | ICD-10-CM | POA: Diagnosis not present

## 2023-01-10 DIAGNOSIS — I10 Essential (primary) hypertension: Secondary | ICD-10-CM | POA: Diagnosis not present

## 2023-01-10 DIAGNOSIS — Z8781 Personal history of (healed) traumatic fracture: Secondary | ICD-10-CM | POA: Diagnosis not present

## 2023-01-10 DIAGNOSIS — I69818 Other symptoms and signs involving cognitive functions following other cerebrovascular disease: Secondary | ICD-10-CM | POA: Diagnosis not present

## 2023-01-10 DIAGNOSIS — F0153 Vascular dementia, unspecified severity, with mood disturbance: Secondary | ICD-10-CM | POA: Diagnosis not present

## 2023-01-13 DIAGNOSIS — F0153 Vascular dementia, unspecified severity, with mood disturbance: Secondary | ICD-10-CM | POA: Diagnosis not present

## 2023-01-13 DIAGNOSIS — I69818 Other symptoms and signs involving cognitive functions following other cerebrovascular disease: Secondary | ICD-10-CM | POA: Diagnosis not present

## 2023-01-13 DIAGNOSIS — Z8781 Personal history of (healed) traumatic fracture: Secondary | ICD-10-CM | POA: Diagnosis not present

## 2023-01-13 DIAGNOSIS — I10 Essential (primary) hypertension: Secondary | ICD-10-CM | POA: Diagnosis not present

## 2023-01-13 DIAGNOSIS — J690 Pneumonitis due to inhalation of food and vomit: Secondary | ICD-10-CM | POA: Diagnosis not present

## 2023-01-13 DIAGNOSIS — E119 Type 2 diabetes mellitus without complications: Secondary | ICD-10-CM | POA: Diagnosis not present

## 2023-01-17 ENCOUNTER — Encounter: Payer: Self-pay | Admitting: Orthopedic Surgery

## 2023-01-17 ENCOUNTER — Non-Acute Institutional Stay (SKILLED_NURSING_FACILITY): Payer: Medicare Other | Admitting: Orthopedic Surgery

## 2023-01-17 DIAGNOSIS — F03918 Unspecified dementia, unspecified severity, with other behavioral disturbance: Secondary | ICD-10-CM | POA: Diagnosis not present

## 2023-01-17 DIAGNOSIS — E1165 Type 2 diabetes mellitus with hyperglycemia: Secondary | ICD-10-CM | POA: Diagnosis not present

## 2023-01-17 MED ORDER — GLIPIZIDE 2.5 MG PO TABS: 2.5000 mg | ORAL_TABLET | Freq: Every morning | ORAL | Status: AC

## 2023-01-17 NOTE — Progress Notes (Signed)
Location:   [facility]ursing Home Room Number: 39-A Place of Service:  SNF [phone]) Provider:  Hazle Nordmann, NP  PCP: Mahlon Gammon, MD  Patient Care Team: Mahlon Gammon, MD as PCP - General (Internal Medicine)  Extended Emergency Contact Information Primary Emergency Contact: Harris,Margaret Address: [address]          [place], [place] [address] Darden Amber of [place] Home Phone: [phone] Mobile Phone: [phone] Relation: Daughter  Code Status:  DNR Goals of care: Advanced Directive information    01/17/2023   11:51 AM  Advanced Directives  Does Patient Have a Medical Advance Directive? Yes  Type of Estate agent of [place];Living will;Out of facility DNR (pink MOST or yellow form)  Does patient want to make changes to medical advance directive? No - Patient declined  Copy of Healthcare Power of Attorney in Chart? Yes - validated most recent copy scanned in chart (See row information)     Chief Complaint  Patient presents with   Acute Visit    Hyperglycemia.    HPI:  Pt is a 89 y.o. female seen today for acute visit due to hyperglycemia.  She currently resides on the skilled nursing unit at [facility]. Past medical includes: HTN, constipation, T2DM, hypothyroidism, OA, urinary retention, vascular dementia, depression and anxiety.     Followed by hospice due to advanced dementia.    "A1c 8.8 (10/31)> was 7.2 (03/19)> 6.5 (12/22). Not on medication. Allergy to metformin. She has gained about 30 lbs within past 18 months. Current weight 152 lbs> was 121 lbs 05/2021. Diet changed to smaller portions. She is receiving Glucerna twice daily. Afebrile. Vitals stable."  11/01 Glucerna discontinued. CBG 3x/week x 2 weeks ordered. Recent blood sugars 179, 197, 202, 220, 209, 191. No hypoglycemic events. Weight remains > 150 lbs.      Past Medical History:  Diagnosis Date   Burst fracture of lumbar vertebra, sequela 12/27/2019    Closed left hip fracture (HCC) 04/07/2020   Diabetes mellitus type 2 in obese 11/09/2017   12/13/19 Hgb a1c 6.8, Na 141, K 4.3, Bun 16, creat 0.78, eGFR 67, wbc 9.6, Hgb 12.6, plt 318, neutrophils 60.9%, LDL 51  01/10/20 wbc 14.3, Hgb 13.6, plt 523, neutrophils 70.5%, Na 142, K 3.3, Bun 27, creat 0.62, eGFR 80  01/31/20 eye exam when the patient is able.   01/08/21 Hgb a1c 5.5   Fall as cause of accidental injury in residential institution as place of occurrence 04/07/2020   Hearing loss    History of fracture of clavicle    Hypokalemia 01/21/2020   01/21/20 Na 141, K 4.1, Bun 12, creat 0.65, eGFR 79, wbc 9.2, Hgb 12.7, plt 331   Hypothyroidism    Insomnia    Right lower lobe pneumonia 01/14/2020   Tachycardia 04/03/2019   Tinnitus    Unspecified fracture of the lower end of left radius, initial encounter for closed fracture 04/07/2020   Past Surgical History:  Procedure Laterality Date   CHOLECYSTECTOMY  2007   Dr. Alan Mulder   CLAVICLE EXCISION  1986   INTRAMEDULLARY (IM) NAIL INTERTROCHANTERIC Left 04/09/2020   Procedure: LEFT INTERTROCHANTERIC INTRAMEDULLARY (IM) NAIL;  Surgeon: Tarry Kos, MD;  Location: [facility] OR;  Service: Orthopedics;  Laterality: Left;   MOHS SURGERY     past 10 years chest & legs   OPEN REDUCTION INTERNAL FIXATION (ORIF) DISTAL RADIAL FRACTURE Left 04/09/2020   Procedure: CLOSED REDUCTION LEFT DISTAL RADIUS FRACTURE;  Surgeon: Tarry Kos, MD;  Location: [facility] OR;  Service: Orthopedics;  Laterality: Left;    Allergies  Allergen Reactions   Metformin And Related Diarrhea    Allergies as of 01/17/2023       Reactions   Metformin And Related Diarrhea        Medication List        Accurate as of January 17, 2023 11:51 AM. If you have any questions, ask your nurse or doctor.          STOP taking these medications    Glucerna Liqd Stopped by: Zuha Dejonge E Dereon Corkery       TAKE these medications    acetaminophen 325 MG tablet Commonly known as: TYLENOL Take  650 mg by mouth every 8 (eight) hours as needed.   CeraVe Baby 1 % Lotn Generic drug: DIMETHICONE (TOPICAL) Apply 1 Application topically daily. Apply to shoulders, back, and chest.   CeraVe Lotn Apply 1 Application topically as directed. Apply to back and chest topically every shift every 2 days for dry skin.   CeraVe Lotn Apply 1 Application topically daily. Apply to back and chest.   divalproex 125 MG capsule Commonly known as: DEPAKOTE SPRINKLE Take 1 capsule (125 mg total) by mouth daily.   docusate sodium 100 MG capsule Commonly known as: COLACE Take 100 mg by mouth daily.   hydrocortisone cream 1 % Apply 1 Application topically as needed for itching.   hydrocortisone cream 1 % Apply 1 Application topically every 12 (twelve) hours as needed for itching.   ipratropium-albuterol 0.5-2.5 (3) MG/3ML Soln Commonly known as: DUONEB Take 3 mLs by nebulization every 6 (six) hours as needed.   levothyroxine 75 MCG tablet Commonly known as: SYNTHROID TAKE 1 TABLET (75 MCG TOTAL) BY MOUTH DAILY BEFORE BREAKFAST.   metoprolol tartrate 25 MG tablet Commonly known as: LOPRESSOR Take 12.5 mg by mouth 2 (two) times daily.   nystatin powder Apply 1 Application topically daily as needed.   ondansetron 4 MG tablet Commonly known as: ZOFRAN Take 4 mg by mouth every 12 (twelve) hours as needed for nausea or vomiting. What changed: Another medication with the same name was removed. Continue taking this medication, and follow the directions you see here. Changed by: Octavia Heir   OXYGEN 2lpm as needed   polyethylene glycol 17 g packet Commonly known as: MIRALAX / GLYCOLAX Take 17 g by mouth daily as needed for moderate constipation.   PreviDent 5000 Booster Plus 1.1 % Pste Generic drug: Sodium Fluoride Place 1 Application onto teeth in the morning and at bedtime.   QUEtiapine 25 MG tablet Commonly known as: SEROQUEL Take 37.5 mg by mouth daily at 2 PM. Hold 09/13- 09/18  while on paxlovid   sertraline 50 MG tablet Commonly known as: ZOLOFT Take 75 mg by mouth daily.   zinc oxide 20 % ointment Apply 1 application topically as needed for irritation.        Review of Systems  Unable to perform ROS: Dementia    Immunization History  Administered Date(s) Administered   DTaP 07/20/2013   Fluad Quad(high Dose 65+) 12/23/2021   Influenza, High Dose Seasonal PF 12/07/2016   Influenza,inj,Quad PF,6+ Mos 12/01/2017   Influenza-Unspecified 11/20/2014, 12/12/2015, 11/20/2019   Moderna SARS-COV2 Booster Vaccination 08/06/2020   Moderna Sars-Covid-2 Vaccination 03/05/2019, 04/02/2019   PFIZER(Purple Top)SARS-COV-2 Vaccination 04/01/2020, 08/06/2020, 11/19/2020, 01/05/2022   PPD Test 08/06/2014   Pneumococcal Conjugate-13 11/07/2013   Pneumococcal Polysaccharide-23 03/22/2017   Tetanus 03/02/2015  Zoster Recombinant(Shingrix) 06/07/2017, 08/26/2017   Zoster, Live 03/01/2006   Pertinent  Health Maintenance Due  Topic Date Due   INFLUENZA VACCINE  09/30/2022   OPHTHALMOLOGY EXAM  11/19/2022   HEMOGLOBIN A1C  06/30/2023   FOOT EXAM  12/24/2023   DEXA SCAN  Completed      09/28/2021   10:30 AM 05/19/2022   12:28 PM 09/10/2022    2:05 PM 10/25/2022    9:27 AM 12/24/2022    2:09 PM  Fall Risk  Falls in the past year? 1 1 0 0 1  Was there an injury with Fall? 0 0 0 0 0  Fall Risk Category Calculator 2 1 0 0 1  Fall Risk Category (Retired) Moderate      (RETIRED) Patient Fall Risk Level Moderate fall risk      Patient at Risk for Falls Due to History of fall(s);Impaired balance/gait;Impaired mobility History of fall(s);Impaired balance/gait;Impaired mobility History of fall(s);Impaired balance/gait;Impaired mobility History of fall(s);Impaired balance/gait;Impaired mobility History of fall(s);Impaired balance/gait;Impaired mobility  Fall risk Follow up Falls evaluation completed;Education provided;Falls prevention discussed Falls evaluation  completed;Education provided;Falls prevention discussed Falls evaluation completed;Education provided;Falls prevention discussed Falls evaluation completed;Education provided;Falls prevention discussed Falls evaluation completed;Education provided;Falls prevention discussed   Functional Status Survey:    Vitals:   01/17/23 1141  BP: 124/74  Pulse: 98  Resp: 18  Temp: (!) 96.6 F (35.9 C)  SpO2: 95%  Weight: 152 lb 6.4 oz (69.1 kg)  Height: 5' (1.524 m)   Body mass index is 29.76 kg/m. Physical Exam Vitals reviewed.  Constitutional:      General: She is not in acute distress. HENT:     Head: Normocephalic.  Eyes:     General:        Right eye: No discharge.        Left eye: No discharge.  Cardiovascular:     Rate and Rhythm: Normal rate and regular rhythm.     Pulses: Normal pulses.     Heart sounds: Normal heart sounds.  Pulmonary:     Effort: Pulmonary effort is normal.     Breath sounds: Normal breath sounds.  Abdominal:     Palpations: Abdomen is soft.  Musculoskeletal:     Cervical back: Neck supple.  Skin:    General: Skin is warm.     Capillary Refill: Capillary refill takes less than 2 seconds.  Neurological:     General: No focal deficit present.     Mental Status: She is alert.  Psychiatric:        Mood and Affect: Mood normal.     Labs reviewed: Recent Labs    04/23/22 0000 05/18/22 0000 08/31/22 0000  NA 137 142 137  K 4.4 4.4 3.9  CL 101 105 102  CO2 26* 28* 27*  BUN 22* 23* 16  CREATININE 0.7 0.7 0.7  CALCIUM 9.2 8.7 9.1   Recent Labs    04/23/22 0000 05/18/22 0000  AST 14 11*  ALT 10 13  ALKPHOS 95 100  ALBUMIN 3.8 3.4*   Recent Labs    04/23/22 0000 05/18/22 0000 08/31/22 0000 12/27/22 0000  WBC 10.3 9.8 9.6  --   NEUTROABS 6,489.00  --  7,085.00  --   HGB 13.4 12.4 12.6 12.3  HCT 41 38 39  --   PLT 280 259 262  --    Lab Results  Component Value Date   TSH 2.18 07/02/2022   Lab Results  Component Value Date  HGBA1C 8.8 12/31/2022   Lab Results  Component Value Date   CHOL 158 05/18/2022   HDL 37 05/18/2022   LDLCALC 93 05/18/2022   TRIG 187 (A) 05/18/2022   CHOLHDL 3.0 12/13/2019    Significant Diagnostic Results in last 30 days:  No results found.  Assessment/Plan 1. Type 2 diabetes mellitus with hyperglycemia, without long-term current use of insulin (HCC) - A1c 8.8 (10/31)> was 7.2 (03/19)> 6.5 (12/22)  - Recent blood sugars 179, 197, 202, 220, 209, 191 - weight has also increased - 11/01 glucerna discontinued - allergy to metformin  - plan to start glipizide - start daily CBG x 10 days, then weekly - glipiZIDE 2.5 MG TABS; Take 2.5 mg by mouth every morning.  2. Dementia with behavioral disturbance - no recent aggression - dependent with ADLs except feeding - nonambulatory - cont Depakote and Seroquel   Family/ staff Communication: plan discussed with patient and nurse  Labs/tests ordered:  CBG daily x 10 days, then weekly

## 2023-01-20 DIAGNOSIS — E119 Type 2 diabetes mellitus without complications: Secondary | ICD-10-CM | POA: Diagnosis not present

## 2023-01-20 DIAGNOSIS — J690 Pneumonitis due to inhalation of food and vomit: Secondary | ICD-10-CM | POA: Diagnosis not present

## 2023-01-20 DIAGNOSIS — Z8781 Personal history of (healed) traumatic fracture: Secondary | ICD-10-CM | POA: Diagnosis not present

## 2023-01-20 DIAGNOSIS — I10 Essential (primary) hypertension: Secondary | ICD-10-CM | POA: Diagnosis not present

## 2023-01-20 DIAGNOSIS — F0153 Vascular dementia, unspecified severity, with mood disturbance: Secondary | ICD-10-CM | POA: Diagnosis not present

## 2023-01-20 DIAGNOSIS — I69818 Other symptoms and signs involving cognitive functions following other cerebrovascular disease: Secondary | ICD-10-CM | POA: Diagnosis not present

## 2023-01-21 ENCOUNTER — Non-Acute Institutional Stay (SKILLED_NURSING_FACILITY): Payer: Self-pay | Admitting: Orthopedic Surgery

## 2023-01-21 ENCOUNTER — Encounter: Payer: Self-pay | Admitting: Orthopedic Surgery

## 2023-01-21 DIAGNOSIS — F03918 Unspecified dementia, unspecified severity, with other behavioral disturbance: Secondary | ICD-10-CM

## 2023-01-21 DIAGNOSIS — I10 Essential (primary) hypertension: Secondary | ICD-10-CM | POA: Diagnosis not present

## 2023-01-21 DIAGNOSIS — F339 Major depressive disorder, recurrent, unspecified: Secondary | ICD-10-CM

## 2023-01-21 DIAGNOSIS — F419 Anxiety disorder, unspecified: Secondary | ICD-10-CM

## 2023-01-21 DIAGNOSIS — E039 Hypothyroidism, unspecified: Secondary | ICD-10-CM | POA: Diagnosis not present

## 2023-01-21 DIAGNOSIS — R1310 Dysphagia, unspecified: Secondary | ICD-10-CM

## 2023-01-21 DIAGNOSIS — I69818 Other symptoms and signs involving cognitive functions following other cerebrovascular disease: Secondary | ICD-10-CM | POA: Diagnosis not present

## 2023-01-21 DIAGNOSIS — J690 Pneumonitis due to inhalation of food and vomit: Secondary | ICD-10-CM | POA: Diagnosis not present

## 2023-01-21 DIAGNOSIS — E119 Type 2 diabetes mellitus without complications: Secondary | ICD-10-CM | POA: Diagnosis not present

## 2023-01-21 DIAGNOSIS — Z8781 Personal history of (healed) traumatic fracture: Secondary | ICD-10-CM | POA: Diagnosis not present

## 2023-01-21 DIAGNOSIS — E1165 Type 2 diabetes mellitus with hyperglycemia: Secondary | ICD-10-CM | POA: Diagnosis not present

## 2023-01-21 DIAGNOSIS — F0153 Vascular dementia, unspecified severity, with mood disturbance: Secondary | ICD-10-CM | POA: Diagnosis not present

## 2023-01-21 NOTE — Progress Notes (Signed)
Location:  [facility] Room Number: 39/A Place of Service:  SNF [phone]) Provider:  Octavia Heir, NP   Mahlon Gammon, MD  Patient Care Team: Mahlon Gammon, MD as PCP - General (Internal Medicine)  Extended Emergency Contact Information Primary Emergency Contact: Harris,Margaret Address: [address]          [place], [place] [address] [place] of [place] Home Phone: [phone] Mobile Phone: [phone] Relation: Daughter  Code Status:  DNR Goals of care: Advanced Directive information    01/17/2023   11:51 AM  Advanced Directives  Does Patient Have a Medical Advance Directive? Yes  Type of Estate agent of [place];Living will;Out of facility DNR (pink MOST or yellow form)  Does patient want to make changes to medical advance directive? No - Patient declined  Copy of Healthcare Power of Attorney in Chart? Yes - validated most recent copy scanned in chart (See row information)     Chief Complaint  Patient presents with   Medical Management of Chronic Issues    HPI:  Pt is a 89 y.o. female seen today for medical management of chronic diseases.   She currently resides on the skilled nursing unit at [facility]. Past medical includes: HTN, constipation, T2DM, hypothyroidism, OA, urinary retention, vascular dementia, depression and anxiety.     Dementia- followed by hospice> enrolled 08/2022, BIMS score 02/15 (10/2022), CT head 04/2020 age related atrophy and chronic small vessel ischemia, no behaviors, dependent with ADLs except feeding, remains on Depakote and Seroquel  T2DM- A1c 8.8 (10/31), 7.2 (03/19)> was 6.5 (12/22), no hypoglycemic events, recent blood sugars 179, 197, 202, 220, 209, 191, allergy to metformin, 11/18 started on glipizide Hypothyroidism- TSH 2.18 07/02/2022, remains on levothyroxine Depression/anxiety- good family support, remains on Zoloft, Na+ 137 08/31/2022, off Ativan at this time  HTN- BUN/creat  16/0.7 08/31/2022, remains on metoprolol Dysphagia- no recent aspirations, on chopped diet  Recent blood pressures:  11/19- 161/81  11/12- 124/74  11/05- 142/85  Recent weights:  11/01- 155.3 lbs  10/01- 152.4 lbs  09/02- 153.8 lbs     Past Medical History:  Diagnosis Date   Burst fracture of lumbar vertebra, sequela 12/27/2019   Closed left hip fracture (HCC) 04/07/2020   Diabetes mellitus type 2 in obese 11/09/2017   12/13/19 Hgb a1c 6.8, Na 141, K 4.3, Bun 16, creat 0.78, eGFR 67, wbc 9.6, Hgb 12.6, plt 318, neutrophils 60.9%, LDL 51  01/10/20 wbc 14.3, Hgb 13.6, plt 523, neutrophils 70.5%, Na 142, K 3.3, Bun 27, creat 0.62, eGFR 80  01/31/20 eye exam when the patient is able.   01/08/21 Hgb a1c 5.5   Fall as cause of accidental injury in residential institution as place of occurrence 04/07/2020   Hearing loss    History of fracture of clavicle    Hypokalemia 01/21/2020   01/21/20 Na 141, K 4.1, Bun 12, creat 0.65, eGFR 79, wbc 9.2, Hgb 12.7, plt 331   Hypothyroidism    Insomnia    Right lower lobe pneumonia 01/14/2020   Tachycardia 04/03/2019   Tinnitus    Unspecified fracture of the lower end of left radius, initial encounter for closed fracture 04/07/2020   Past Surgical History:  Procedure Laterality Date   CHOLECYSTECTOMY  2007   Dr. Alan Mulder   CLAVICLE EXCISION  1986   INTRAMEDULLARY (IM) NAIL INTERTROCHANTERIC Left 04/09/2020   Procedure: LEFT INTERTROCHANTERIC INTRAMEDULLARY (IM) NAIL;  Surgeon: Tarry Kos, MD;  Location: [facility] OR;  Service: Orthopedics;  Laterality: Left;   MOHS SURGERY     past 10 years chest & legs   OPEN REDUCTION INTERNAL FIXATION (ORIF) DISTAL RADIAL FRACTURE Left 04/09/2020   Procedure: CLOSED REDUCTION LEFT DISTAL RADIUS FRACTURE;  Surgeon: Tarry Kos, MD;  Location: [facility] OR;  Service: Orthopedics;  Laterality: Left;    Allergies  Allergen Reactions   Metformin And Related Diarrhea    Outpatient Encounter Medications as of 01/21/2023   Medication Sig   acetaminophen (TYLENOL) 325 MG tablet Take 650 mg by mouth every 8 (eight) hours as needed.   DIMETHICONE, TOPICAL, (CERAVE BABY) 1 % LOTN Apply 1 Application topically daily. Apply to shoulders, back, and chest.   divalproex (DEPAKOTE SPRINKLE) 125 MG capsule Take 1 capsule (125 mg total) by mouth daily.   docusate sodium (COLACE) 100 MG capsule Take 100 mg by mouth daily.   Emollient (CERAVE) LOTN Apply 1 Application topically as directed. Apply to back and chest topically every shift every 2 days for dry skin.   Emollient (CERAVE) LOTN Apply 1 Application topically daily. Apply to back and chest.   glipiZIDE 2.5 MG TABS Take 2.5 mg by mouth every morning.   hydrocortisone cream 1 % Apply 1 Application topically as needed for itching.   hydrocortisone cream 1 % Apply 1 Application topically every 12 (twelve) hours as needed for itching.   ipratropium-albuterol (DUONEB) 0.5-2.5 (3) MG/3ML SOLN Take 3 mLs by nebulization every 6 (six) hours as needed.   levothyroxine (SYNTHROID) 75 MCG tablet TAKE 1 TABLET (75 MCG TOTAL) BY MOUTH DAILY BEFORE BREAKFAST.   metoprolol tartrate (LOPRESSOR) 25 MG tablet Take 12.5 mg by mouth 2 (two) times daily.   nystatin powder Apply 1 Application topically daily as needed.   ondansetron (ZOFRAN) 4 MG tablet Take 4 mg by mouth every 12 (twelve) hours as needed for nausea or vomiting.   OXYGEN 2lpm as needed   polyethylene glycol (MIRALAX / GLYCOLAX) 17 g packet Take 17 g by mouth daily as needed for moderate constipation.   QUEtiapine (SEROQUEL) 25 MG tablet Take 37.5 mg by mouth daily at 2 PM. Hold 09/13- 09/18 while on paxlovid   sertraline (ZOLOFT) 50 MG tablet Take 75 mg by mouth daily.   Sodium Fluoride (PREVIDENT 5000 BOOSTER PLUS) 1.1 % PSTE Place 1 Application onto teeth in the morning and at bedtime.   zinc oxide 20 % ointment Apply 1 application topically as needed for irritation.   [DISCONTINUED] divalproex (DEPAKOTE) 125 MG DR tablet  Take 125 mg by mouth daily.   No facility-administered encounter medications on file as of 01/21/2023.    Review of Systems  Unable to perform ROS: Dementia    Immunization History  Administered Date(s) Administered   DTaP 07/20/2013   Fluad Quad(high Dose 65+) 12/23/2021   Influenza, High Dose Seasonal PF 12/07/2016   Influenza,inj,Quad PF,6+ Mos 12/01/2017   Influenza-Unspecified 11/20/2014, 12/12/2015, 11/20/2019   Moderna SARS-COV2 Booster Vaccination 08/06/2020   Moderna Sars-Covid-2 Vaccination 03/05/2019, 04/02/2019   PFIZER(Purple Top)SARS-COV-2 Vaccination 04/01/2020, 08/06/2020, 11/19/2020, 01/05/2022   PPD Test 08/06/2014   Pneumococcal Conjugate-13 11/07/2013   Pneumococcal Polysaccharide-23 03/22/2017   Tetanus 03/02/2015   Zoster Recombinant(Shingrix) 06/07/2017, 08/26/2017   Zoster, Live 03/01/2006   Pertinent  Health Maintenance Due  Topic Date Due   INFLUENZA VACCINE  09/30/2022   OPHTHALMOLOGY EXAM  11/19/2022   HEMOGLOBIN A1C  06/30/2023   FOOT EXAM  12/24/2023   DEXA SCAN  Completed  09/28/2021   10:30 AM 05/19/2022   12:28 PM 09/10/2022    2:05 PM 10/25/2022    9:27 AM 12/24/2022    2:09 PM  Fall Risk  Falls in the past year? 1 1 0 0 1  Was there an injury with Fall? 0 0 0 0 0  Fall Risk Category Calculator 2 1 0 0 1  Fall Risk Category (Retired) Moderate      (RETIRED) Patient Fall Risk Level Moderate fall risk      Patient at Risk for Falls Due to History of fall(s);Impaired balance/gait;Impaired mobility History of fall(s);Impaired balance/gait;Impaired mobility History of fall(s);Impaired balance/gait;Impaired mobility History of fall(s);Impaired balance/gait;Impaired mobility History of fall(s);Impaired balance/gait;Impaired mobility  Fall risk Follow up Falls evaluation completed;Education provided;Falls prevention discussed Falls evaluation completed;Education provided;Falls prevention discussed Falls evaluation completed;Education  provided;Falls prevention discussed Falls evaluation completed;Education provided;Falls prevention discussed Falls evaluation completed;Education provided;Falls prevention discussed   Functional Status Survey:    Vitals:   01/21/23 1459  BP: 127/67  Pulse: 80  Resp: 16  Temp: 97.6 F (36.4 C)  SpO2: 94%  Weight: 152 lb 6.4 oz (69.1 kg)  Height: 5' (1.524 m)   Body mass index is 29.76 kg/m. Physical Exam Vitals reviewed.  Constitutional:      General: She is not in acute distress. HENT:     Head: Normocephalic.     Right Ear: There is no impacted cerumen.     Left Ear: There is no impacted cerumen.     Nose: Nose normal.     Mouth/Throat:     Mouth: Mucous membranes are moist.  Eyes:     General:        Right eye: No discharge.        Left eye: No discharge.  Cardiovascular:     Rate and Rhythm: Normal rate and regular rhythm.     Pulses: Normal pulses.     Heart sounds: Normal heart sounds.  Pulmonary:     Effort: Pulmonary effort is normal. No respiratory distress.     Breath sounds: Normal breath sounds. No wheezing.  Abdominal:     General: Bowel sounds are normal. There is no distension.     Palpations: Abdomen is soft.     Tenderness: There is no abdominal tenderness.  Musculoskeletal:     Cervical back: Neck supple.     Right lower leg: No edema.     Left lower leg: No edema.  Skin:    General: Skin is warm.     Capillary Refill: Capillary refill takes less than 2 seconds.  Neurological:     General: No focal deficit present.     Mental Status: She is alert and oriented to person, place, and time.  Psychiatric:        Mood and Affect: Mood normal.     Labs reviewed: Recent Labs    04/23/22 0000 05/18/22 0000 08/31/22 0000  NA 137 142 137  K 4.4 4.4 3.9  CL 101 105 102  CO2 26* 28* 27*  BUN 22* 23* 16  CREATININE 0.7 0.7 0.7  CALCIUM 9.2 8.7 9.1   Recent Labs    04/23/22 0000 05/18/22 0000  AST 14 11*  ALT 10 13  ALKPHOS 95 100   ALBUMIN 3.8 3.4*   Recent Labs    04/23/22 0000 05/18/22 0000 08/31/22 0000 12/27/22 0000  WBC 10.3 9.8 9.6  --   NEUTROABS 6,489.00  --  7,085.00  --   HGB 13.4  12.4 12.6 12.3  HCT 41 38 39  --   PLT 280 259 262  --    Lab Results  Component Value Date   TSH 2.18 07/02/2022   Lab Results  Component Value Date   HGBA1C 8.8 12/31/2022   Lab Results  Component Value Date   CHOL 158 05/18/2022   HDL 37 05/18/2022   LDLCALC 93 05/18/2022   TRIG 187 (A) 05/18/2022   CHOLHDL 3.0 12/13/2019    Significant Diagnostic Results in last 30 days:  No results found.  Assessment/Plan 1. Dementia with behavioral disturbance (HCC) - followed by hospice - some sundowning  - weights stable - dependent with ADLs - nonambulatory - cont Depakote and Seroquel  2. Type 2 diabetes mellitus with hyperglycemia, without long-term current use of insulin (HCC) - A1c 8.8 - due to weight gain> glucerna discontinued - sugars averaging 200's - 11/18 started on glipizide   3. Acquired hypothyroidism - TSH stable - cont levothyroxine  4. Recurrent depression (HCC) - Na+ stable - cont Zoloft  5. Anxiety - no recent panic attacks - cont Zoloft  6. Primary hypertension - controlled with metoprolol  7. Dysphagia, unspecified type - no recent aspirations  - cont chopped foods    Family/ staff Communication: plan discussed with patient and nurse  Labs/tests ordered:  none

## 2023-01-24 DIAGNOSIS — E119 Type 2 diabetes mellitus without complications: Secondary | ICD-10-CM | POA: Diagnosis not present

## 2023-01-24 DIAGNOSIS — Z8781 Personal history of (healed) traumatic fracture: Secondary | ICD-10-CM | POA: Diagnosis not present

## 2023-01-24 DIAGNOSIS — J690 Pneumonitis due to inhalation of food and vomit: Secondary | ICD-10-CM | POA: Diagnosis not present

## 2023-01-24 DIAGNOSIS — I69818 Other symptoms and signs involving cognitive functions following other cerebrovascular disease: Secondary | ICD-10-CM | POA: Diagnosis not present

## 2023-01-24 DIAGNOSIS — F0153 Vascular dementia, unspecified severity, with mood disturbance: Secondary | ICD-10-CM | POA: Diagnosis not present

## 2023-01-24 DIAGNOSIS — I10 Essential (primary) hypertension: Secondary | ICD-10-CM | POA: Diagnosis not present

## 2023-01-28 DIAGNOSIS — I69818 Other symptoms and signs involving cognitive functions following other cerebrovascular disease: Secondary | ICD-10-CM | POA: Diagnosis not present

## 2023-01-28 DIAGNOSIS — I10 Essential (primary) hypertension: Secondary | ICD-10-CM | POA: Diagnosis not present

## 2023-01-28 DIAGNOSIS — Z8781 Personal history of (healed) traumatic fracture: Secondary | ICD-10-CM | POA: Diagnosis not present

## 2023-01-28 DIAGNOSIS — F0153 Vascular dementia, unspecified severity, with mood disturbance: Secondary | ICD-10-CM | POA: Diagnosis not present

## 2023-01-28 DIAGNOSIS — J690 Pneumonitis due to inhalation of food and vomit: Secondary | ICD-10-CM | POA: Diagnosis not present

## 2023-01-28 DIAGNOSIS — E119 Type 2 diabetes mellitus without complications: Secondary | ICD-10-CM | POA: Diagnosis not present

## 2023-01-30 DIAGNOSIS — F0153 Vascular dementia, unspecified severity, with mood disturbance: Secondary | ICD-10-CM | POA: Diagnosis not present

## 2023-01-30 DIAGNOSIS — E119 Type 2 diabetes mellitus without complications: Secondary | ICD-10-CM | POA: Diagnosis not present

## 2023-01-30 DIAGNOSIS — I10 Essential (primary) hypertension: Secondary | ICD-10-CM | POA: Diagnosis not present

## 2023-01-30 DIAGNOSIS — E039 Hypothyroidism, unspecified: Secondary | ICD-10-CM | POA: Diagnosis not present

## 2023-01-30 DIAGNOSIS — J690 Pneumonitis due to inhalation of food and vomit: Secondary | ICD-10-CM | POA: Diagnosis not present

## 2023-01-30 DIAGNOSIS — I69818 Other symptoms and signs involving cognitive functions following other cerebrovascular disease: Secondary | ICD-10-CM | POA: Diagnosis not present

## 2023-01-30 DIAGNOSIS — Z8781 Personal history of (healed) traumatic fracture: Secondary | ICD-10-CM | POA: Diagnosis not present

## 2023-02-02 DIAGNOSIS — Z8781 Personal history of (healed) traumatic fracture: Secondary | ICD-10-CM | POA: Diagnosis not present

## 2023-02-02 DIAGNOSIS — J690 Pneumonitis due to inhalation of food and vomit: Secondary | ICD-10-CM | POA: Diagnosis not present

## 2023-02-02 DIAGNOSIS — I69818 Other symptoms and signs involving cognitive functions following other cerebrovascular disease: Secondary | ICD-10-CM | POA: Diagnosis not present

## 2023-02-02 DIAGNOSIS — I10 Essential (primary) hypertension: Secondary | ICD-10-CM | POA: Diagnosis not present

## 2023-02-02 DIAGNOSIS — F0153 Vascular dementia, unspecified severity, with mood disturbance: Secondary | ICD-10-CM | POA: Diagnosis not present

## 2023-02-02 DIAGNOSIS — E119 Type 2 diabetes mellitus without complications: Secondary | ICD-10-CM | POA: Diagnosis not present

## 2023-02-04 DIAGNOSIS — E119 Type 2 diabetes mellitus without complications: Secondary | ICD-10-CM | POA: Diagnosis not present

## 2023-02-04 DIAGNOSIS — J690 Pneumonitis due to inhalation of food and vomit: Secondary | ICD-10-CM | POA: Diagnosis not present

## 2023-02-04 DIAGNOSIS — Z8781 Personal history of (healed) traumatic fracture: Secondary | ICD-10-CM | POA: Diagnosis not present

## 2023-02-04 DIAGNOSIS — F0153 Vascular dementia, unspecified severity, with mood disturbance: Secondary | ICD-10-CM | POA: Diagnosis not present

## 2023-02-04 DIAGNOSIS — I69818 Other symptoms and signs involving cognitive functions following other cerebrovascular disease: Secondary | ICD-10-CM | POA: Diagnosis not present

## 2023-02-04 DIAGNOSIS — I10 Essential (primary) hypertension: Secondary | ICD-10-CM | POA: Diagnosis not present

## 2023-02-07 DIAGNOSIS — Z8781 Personal history of (healed) traumatic fracture: Secondary | ICD-10-CM | POA: Diagnosis not present

## 2023-02-07 DIAGNOSIS — I10 Essential (primary) hypertension: Secondary | ICD-10-CM | POA: Diagnosis not present

## 2023-02-07 DIAGNOSIS — I69818 Other symptoms and signs involving cognitive functions following other cerebrovascular disease: Secondary | ICD-10-CM | POA: Diagnosis not present

## 2023-02-07 DIAGNOSIS — J690 Pneumonitis due to inhalation of food and vomit: Secondary | ICD-10-CM | POA: Diagnosis not present

## 2023-02-07 DIAGNOSIS — E119 Type 2 diabetes mellitus without complications: Secondary | ICD-10-CM | POA: Diagnosis not present

## 2023-02-07 DIAGNOSIS — F0153 Vascular dementia, unspecified severity, with mood disturbance: Secondary | ICD-10-CM | POA: Diagnosis not present

## 2023-02-08 DIAGNOSIS — J690 Pneumonitis due to inhalation of food and vomit: Secondary | ICD-10-CM | POA: Diagnosis not present

## 2023-02-08 DIAGNOSIS — E119 Type 2 diabetes mellitus without complications: Secondary | ICD-10-CM | POA: Diagnosis not present

## 2023-02-08 DIAGNOSIS — F0153 Vascular dementia, unspecified severity, with mood disturbance: Secondary | ICD-10-CM | POA: Diagnosis not present

## 2023-02-08 DIAGNOSIS — I69818 Other symptoms and signs involving cognitive functions following other cerebrovascular disease: Secondary | ICD-10-CM | POA: Diagnosis not present

## 2023-02-08 DIAGNOSIS — I10 Essential (primary) hypertension: Secondary | ICD-10-CM | POA: Diagnosis not present

## 2023-02-08 DIAGNOSIS — Z8781 Personal history of (healed) traumatic fracture: Secondary | ICD-10-CM | POA: Diagnosis not present

## 2023-02-11 DIAGNOSIS — I69818 Other symptoms and signs involving cognitive functions following other cerebrovascular disease: Secondary | ICD-10-CM | POA: Diagnosis not present

## 2023-02-11 DIAGNOSIS — E119 Type 2 diabetes mellitus without complications: Secondary | ICD-10-CM | POA: Diagnosis not present

## 2023-02-11 DIAGNOSIS — J690 Pneumonitis due to inhalation of food and vomit: Secondary | ICD-10-CM | POA: Diagnosis not present

## 2023-02-11 DIAGNOSIS — Z8781 Personal history of (healed) traumatic fracture: Secondary | ICD-10-CM | POA: Diagnosis not present

## 2023-02-11 DIAGNOSIS — F0153 Vascular dementia, unspecified severity, with mood disturbance: Secondary | ICD-10-CM | POA: Diagnosis not present

## 2023-02-11 DIAGNOSIS — I10 Essential (primary) hypertension: Secondary | ICD-10-CM | POA: Diagnosis not present

## 2023-02-15 ENCOUNTER — Encounter: Payer: Self-pay | Admitting: Adult Health

## 2023-02-15 ENCOUNTER — Non-Acute Institutional Stay (SKILLED_NURSING_FACILITY): Payer: Self-pay | Admitting: Adult Health

## 2023-02-15 DIAGNOSIS — L0292 Furuncle, unspecified: Secondary | ICD-10-CM

## 2023-02-15 DIAGNOSIS — Z8781 Personal history of (healed) traumatic fracture: Secondary | ICD-10-CM | POA: Diagnosis not present

## 2023-02-15 DIAGNOSIS — J690 Pneumonitis due to inhalation of food and vomit: Secondary | ICD-10-CM | POA: Diagnosis not present

## 2023-02-15 DIAGNOSIS — I69818 Other symptoms and signs involving cognitive functions following other cerebrovascular disease: Secondary | ICD-10-CM | POA: Diagnosis not present

## 2023-02-15 DIAGNOSIS — I10 Essential (primary) hypertension: Secondary | ICD-10-CM | POA: Diagnosis not present

## 2023-02-15 DIAGNOSIS — F0153 Vascular dementia, unspecified severity, with mood disturbance: Secondary | ICD-10-CM | POA: Diagnosis not present

## 2023-02-15 DIAGNOSIS — F03918 Unspecified dementia, unspecified severity, with other behavioral disturbance: Secondary | ICD-10-CM | POA: Diagnosis not present

## 2023-02-15 DIAGNOSIS — E119 Type 2 diabetes mellitus without complications: Secondary | ICD-10-CM | POA: Diagnosis not present

## 2023-02-15 MED ORDER — DOXYCYCLINE HYCLATE 100 MG PO TABS
100.0000 mg | ORAL_TABLET | Freq: Two times a day (BID) | ORAL | Status: AC
Start: 2023-02-15 — End: 2023-02-22

## 2023-02-15 NOTE — Progress Notes (Signed)
Location:  [facility] Room Number: N39-A Place of Service:  SNF (31) Provider:  Kenard Gower, DNP, FNP-BC  Patient Care Team: Mahlon Gammon, MD as PCP - General (Internal Medicine)  Extended Emergency Contact Information Primary Emergency Contact: Harris,Margaret Address: [address]          [place], [place] [address] D[place] of [place] Home Phone: [phone] Mobile Phone: [phone] Relation: Daughter  Code Status:  DNR  Goals of care: Advanced Directive information    02/15/2023   10:08 AM  Advanced Directives  Does Patient Have a Medical Advance Directive? Yes  Type of Estate agent of [place];Living will;Out of facility DNR (pink MOST or yellow form)  Does patient want to make changes to medical advance directive? No - Patient declined  Copy of Healthcare Power of Attorney in Chart? Yes - validated most recent copy scanned in chart (See row information)  Pre-existing out of facility DNR order (yellow form or pink MOST form) Pink MOST form placed in chart (order not valid for inpatient use);Yellow form placed in chart (order not valid for inpatient use)     Chief Complaint  Patient presents with   Acute Visit     possible boil    HPI:  Pt is a 89 y.o. Castillo seen today for an acute visit regarding possible boil. She is  resident of [facility]. She was noted to have a scabbed area with small opening with purulent drainage on her pubic region. Area is tender to touch.  She has dementia with BIMS score 2/15.   Past Medical History:  Diagnosis Date   Burst fracture of lumbar vertebra, sequela 12/27/2019   Closed left hip fracture (HCC) 04/07/2020   Diabetes mellitus type 2 in obese 11/09/2017   12/13/19 Hgb a1c 6.8, Na 141, K 4.3, Bun 16, creat 0.78, eGFR 67, wbc 9.6, Hgb 12.6, plt 318, neutrophils 60.9%, LDL 51  01/10/20 wbc 14.3, Hgb 13.6, plt 523, neutrophils 70.5%, Na 142, K 3.3, Bun 27,  creat 0.62, eGFR 80  01/31/20 eye exam when the patient is able.   01/08/21 Hgb a1c 5.5   Fall as cause of accidental injury in residential institution as place of occurrence 04/07/2020   Hearing loss    History of fracture of clavicle    Hypokalemia 01/21/2020   01/21/20 Na 141, K 4.1, Bun 12, creat 0.65, eGFR 79, wbc 9.2, Hgb 12.7, plt 331   Hypothyroidism    Insomnia    Right lower lobe pneumonia 01/14/2020   Tachycardia 04/03/2019   Tinnitus    Unspecified fracture of the lower end of left radius, initial encounter for closed fracture 04/07/2020   Past Surgical History:  Procedure Laterality Date   CHOLECYSTECTOMY  2007   Dr. Alan Mulder   CLAVICLE EXCISION  1986   INTRAMEDULLARY (IM) NAIL INTERTROCHANTERIC Left 04/09/2020   Procedure: LEFT INTERTROCHANTERIC INTRAMEDULLARY (IM) NAIL;  Surgeon: Tarry Kos, MD;  Location: [facility] OR;  Service: Orthopedics;  Laterality: Left;   MOHS SURGERY     past 10 years chest & legs   OPEN REDUCTION INTERNAL FIXATION (ORIF) DISTAL RADIAL FRACTURE Left 04/09/2020   Procedure: CLOSED REDUCTION LEFT DISTAL RADIUS FRACTURE;  Surgeon: Tarry Kos, MD;  Location: [facility] OR;  Service: Orthopedics;  Laterality: Left;    Allergies  Allergen Reactions   Metformin And Related Diarrhea    Outpatient Encounter Medications as of 02/15/2023  Medication Sig   acetaminophen (TYLENOL) 325  MG tablet Take 650 mg by mouth every 8 (eight) hours as needed.   DIMETHICONE, TOPICAL, (CERAVE BABY) 1 % LOTN Apply 1 Application topically daily. Apply to shoulders, back, and chest.   divalproex (DEPAKOTE SPRINKLE) 125 MG capsule Take 1 capsule (125 mg total) by mouth daily.   docusate sodium (COLACE) 100 MG capsule Take 100 mg by mouth daily.   doxycycline (VIBRA-TABS) 100 MG tablet Take 1 tablet (100 mg total) by mouth 2 (two) times daily for 7 days.   Emollient (CERAVE) LOTN Apply 1 Application topically as directed. Apply to back and chest topically every shift every 2 days for  dry skin.   Emollient (CERAVE) LOTN Apply 1 Application topically daily. Apply to back and chest.   glipiZIDE 2.5 MG TABS Take 2.5 mg by mouth every morning.   hydrocortisone cream 1 % Apply 1 Application topically as needed for itching.   hydrocortisone cream 1 % Apply 1 Application topically every 12 (twelve) hours as needed for itching.   ipratropium-albuterol (DUONEB) 0.5-2.5 (3) MG/3ML SOLN Take 3 mLs by nebulization every 6 (six) hours as needed.   levothyroxine (SYNTHROID) 75 MCG tablet TAKE 1 TABLET (75 MCG TOTAL) BY MOUTH DAILY BEFORE BREAKFAST.   metoprolol tartrate (LOPRESSOR) 25 MG tablet Take 12.5 mg by mouth 2 (two) times daily.   nystatin powder Apply 1 Application topically daily as needed.   ondansetron (ZOFRAN) 4 MG tablet Take 4 mg by mouth every 12 (twelve) hours as needed for nausea or vomiting.   OXYGEN 2lpm as needed   polyethylene glycol (MIRALAX / GLYCOLAX) 17 g packet Take 17 g by mouth daily as needed for moderate constipation.   QUEtiapine (SEROQUEL) 25 MG tablet Take 37.5 mg by mouth daily at 2 PM. Hold 09/13- 09/18 while on paxlovid   sertraline (ZOLOFT) 50 MG tablet Take 75 mg by mouth daily.   Sodium Fluoride (PREVIDENT 5000 BOOSTER PLUS) 1.1 % PSTE Place 1 Application onto teeth in the morning and at bedtime.   zinc oxide 20 % ointment Apply 1 application topically as needed for irritation.   [DISCONTINUED] divalproex (DEPAKOTE) 125 MG DR tablet Take 125 mg by mouth daily.   No facility-administered encounter medications on file as of 02/15/2023.    Review of Systems  Unable to obtain due to dementia.    Immunization History  Administered Date(s) Administered   DTaP 07/20/2013   Fluad Quad(high Dose 65+) 12/23/2021, 12/29/2022   Influenza, High Dose Seasonal PF 12/07/2016   Influenza,inj,Quad PF,6+ Mos 12/01/2017   Influenza-Unspecified 11/20/2014, 12/12/2015, 11/20/2019   Moderna Covid-19 Fall Seasonal Vaccine 62yrs & older 01/05/2023   Moderna  SARS-COV2 Booster Vaccination 08/06/2020   Moderna Sars-Covid-2 Vaccination 03/05/2019, 04/02/2019   PFIZER(Purple Top)SARS-COV-2 Vaccination 04/01/2020, 08/06/2020, 11/19/2020, 01/05/2022   PPD Test 08/06/2014   Pneumococcal Conjugate-13 11/07/2013   Pneumococcal Polysaccharide-23 03/22/2017   Tetanus 03/02/2015   Zoster Recombinant(Shingrix) 06/07/2017, 08/26/2017   Zoster, Live 03/01/2006   Pertinent  Health Maintenance Due  Topic Date Due   OPHTHALMOLOGY EXAM  11/19/2022   HEMOGLOBIN A1C  06/30/2023   FOOT EXAM  12/24/2023   INFLUENZA VACCINE  Completed   DEXA SCAN  Completed      09/28/2021   10:30 AM 05/19/2022   12:28 PM 09/10/2022    2:05 PM 8/Lisa/2024    9:27 AM 12/24/2022    2:09 PM  Fall Risk  Falls in the past year? 1 1 0 0 1  Was there an injury with Fall? 0 0  0 0 0  Fall Risk Category Calculator 2 1 0 0 1  Fall Risk Category (Retired) Moderate      (RETIRED) Patient Fall Risk Level Moderate fall risk      Patient at Risk for Falls Due to History of fall(s);Impaired balance/gait;Impaired mobility History of fall(s);Impaired balance/gait;Impaired mobility History of fall(s);Impaired balance/gait;Impaired mobility History of fall(s);Impaired balance/gait;Impaired mobility History of fall(s);Impaired balance/gait;Impaired mobility  Fall risk Follow up Falls evaluation completed;Education provided;Falls prevention discussed Falls evaluation completed;Education provided;Falls prevention discussed Falls evaluation completed;Education provided;Falls prevention discussed Falls evaluation completed;Education provided;Falls prevention discussed Falls evaluation completed;Education provided;Falls prevention discussed     Vitals:   02/15/23 0958  BP: 137/74  Pulse: 87  Resp: 18  Temp: (!) 97.4 F (36.3 C)  SpO2: 97%  Weight: 153 lb 1.6 oz (69.4 kg)  Height: 5' (1.524 m)   Body mass index is 29.9 kg/m.  Physical Exam Constitutional:      General: She is not in acute  distress.    Appearance: She is obese.  HENT:     Head: Normocephalic and atraumatic.     Nose: Nose normal.     Mouth/Throat:     Mouth: Mucous membranes are moist.  Eyes:     Conjunctiva/sclera: Conjunctivae normal.  Cardiovascular:     Rate and Rhythm: Normal rate and regular rhythm.  Pulmonary:     Effort: Pulmonary effort is normal.     Breath sounds: Normal breath sounds.  Abdominal:     General: Bowel sounds are normal.     Palpations: Abdomen is soft.  Musculoskeletal:     Cervical back: Normal range of motion.  Skin:    General: Skin is warm and dry.     Comments: Has a scab draining moderate pus on suprapubic area, tender to touch  Neurological:     Mental Status: Mental status is at baseline.  Psychiatric:        Mood and Affect: Mood normal.        Behavior: Behavior normal.        Labs reviewed: Recent Labs    04/23/22 0000 05/18/22 0000 08/31/22 0000  NA 137 142 137  K 4.4 4.4 3.9  CL 101 105 102  CO2 Lisa* 28* 27*  BUN 22* 23* 16  CREATININE 0.7 0.7 0.7  CALCIUM 9.2 8.7 9.1   Recent Labs    04/23/22 0000 05/18/22 0000  AST 14 11*  ALT 10 13  ALKPHOS 95 100  ALBUMIN 3.8 3.4*   Recent Labs    04/23/22 0000 05/18/22 0000 08/31/22 0000 12/27/22 0000  WBC 10.3 9.8 9.6  --   NEUTROABS 6,489.00  --  7,085.00  --   HGB 13.4 12.4 12.6 12.3  HCT 41 38 39  --   PLT 280 259 262  --    Lab Results  Component Value Date   TSH 2.18 07/02/2022   Lab Results  Component Value Date   HGBA1C 8.8 12/31/2022   Lab Results  Component Value Date   CHOL 158 05/18/2022   HDL 37 05/18/2022   LDLCALC 93 05/18/2022   TRIG 187 (A) 05/18/2022   CHOLHDL 3.0 12/13/2019    Significant Diagnostic Results in last 30 days:  No results found.  Assessment/Plan  1. Furuncle (Primary) -  keep area clean and dry - doxycycline (VIBRA-TABS) 100 MG tablet; Take 1 tablet (100 mg total) by mouth 2 (two) times daily for 7 days.  2. Dementia with behavioral  disturbance (HCC) -  continue supportive care -  fall precautions    Family/ staff Communication: Discussed plan of care with charge nurse.  Labs/tests ordered:  None    Kenard Gower, DNP, MSN, FNP-BC [facility] [phone] (Monday-Friday 8:00 a.m. - 5:00 p.m.) [phone] (after hours)

## 2023-02-16 DIAGNOSIS — I10 Essential (primary) hypertension: Secondary | ICD-10-CM | POA: Diagnosis not present

## 2023-02-16 DIAGNOSIS — Z8781 Personal history of (healed) traumatic fracture: Secondary | ICD-10-CM | POA: Diagnosis not present

## 2023-02-16 DIAGNOSIS — E119 Type 2 diabetes mellitus without complications: Secondary | ICD-10-CM | POA: Diagnosis not present

## 2023-02-16 DIAGNOSIS — J690 Pneumonitis due to inhalation of food and vomit: Secondary | ICD-10-CM | POA: Diagnosis not present

## 2023-02-16 DIAGNOSIS — F0153 Vascular dementia, unspecified severity, with mood disturbance: Secondary | ICD-10-CM | POA: Diagnosis not present

## 2023-02-16 DIAGNOSIS — I69818 Other symptoms and signs involving cognitive functions following other cerebrovascular disease: Secondary | ICD-10-CM | POA: Diagnosis not present

## 2023-02-17 DIAGNOSIS — F0153 Vascular dementia, unspecified severity, with mood disturbance: Secondary | ICD-10-CM | POA: Diagnosis not present

## 2023-02-17 DIAGNOSIS — E119 Type 2 diabetes mellitus without complications: Secondary | ICD-10-CM | POA: Diagnosis not present

## 2023-02-17 DIAGNOSIS — I69818 Other symptoms and signs involving cognitive functions following other cerebrovascular disease: Secondary | ICD-10-CM | POA: Diagnosis not present

## 2023-02-17 DIAGNOSIS — Z8781 Personal history of (healed) traumatic fracture: Secondary | ICD-10-CM | POA: Diagnosis not present

## 2023-02-17 DIAGNOSIS — J690 Pneumonitis due to inhalation of food and vomit: Secondary | ICD-10-CM | POA: Diagnosis not present

## 2023-02-17 DIAGNOSIS — I10 Essential (primary) hypertension: Secondary | ICD-10-CM | POA: Diagnosis not present

## 2023-02-18 ENCOUNTER — Non-Acute Institutional Stay (SKILLED_NURSING_FACILITY): Payer: Self-pay | Admitting: Orthopedic Surgery

## 2023-02-18 ENCOUNTER — Encounter: Payer: Self-pay | Admitting: Orthopedic Surgery

## 2023-02-18 DIAGNOSIS — S51812A Laceration without foreign body of left forearm, initial encounter: Secondary | ICD-10-CM | POA: Diagnosis not present

## 2023-02-18 DIAGNOSIS — E039 Hypothyroidism, unspecified: Secondary | ICD-10-CM | POA: Diagnosis not present

## 2023-02-18 DIAGNOSIS — Z8781 Personal history of (healed) traumatic fracture: Secondary | ICD-10-CM | POA: Diagnosis not present

## 2023-02-18 DIAGNOSIS — L0292 Furuncle, unspecified: Secondary | ICD-10-CM

## 2023-02-18 DIAGNOSIS — F339 Major depressive disorder, recurrent, unspecified: Secondary | ICD-10-CM

## 2023-02-18 DIAGNOSIS — J690 Pneumonitis due to inhalation of food and vomit: Secondary | ICD-10-CM | POA: Diagnosis not present

## 2023-02-18 DIAGNOSIS — I10 Essential (primary) hypertension: Secondary | ICD-10-CM

## 2023-02-18 DIAGNOSIS — R1310 Dysphagia, unspecified: Secondary | ICD-10-CM | POA: Diagnosis not present

## 2023-02-18 DIAGNOSIS — E1165 Type 2 diabetes mellitus with hyperglycemia: Secondary | ICD-10-CM

## 2023-02-18 DIAGNOSIS — I69818 Other symptoms and signs involving cognitive functions following other cerebrovascular disease: Secondary | ICD-10-CM | POA: Diagnosis not present

## 2023-02-18 DIAGNOSIS — E119 Type 2 diabetes mellitus without complications: Secondary | ICD-10-CM | POA: Diagnosis not present

## 2023-02-18 DIAGNOSIS — F0153 Vascular dementia, unspecified severity, with mood disturbance: Secondary | ICD-10-CM | POA: Diagnosis not present

## 2023-02-18 DIAGNOSIS — F01518 Vascular dementia, unspecified severity, with other behavioral disturbance: Secondary | ICD-10-CM

## 2023-02-18 NOTE — Progress Notes (Signed)
Location:  [facility] Room Number: 39/A Place of Service:  SNF [phone]) Provider:  Octavia Heir, NP   Mahlon Gammon, MD  Patient Care Team: Mahlon Gammon, MD as PCP - General (Internal Medicine)  Extended Emergency Contact Information Primary Emergency Contact: Harris,Margaret Address: [address]          [place], [place] [address] [place] of [place] Home Phone: [phone] Mobile Phone: [phone] Relation: Daughter  Code Status:  DNR Goals of care: Advanced Directive information    02/15/2023   10:08 AM  Advanced Directives  Does Patient Have a Medical Advance Directive? Yes  Type of Estate agent of [place];Living will;Out of facility DNR (pink MOST or yellow form)  Does patient want to make changes to medical advance directive? No - Patient declined  Copy of Healthcare Power of Attorney in Chart? Yes - validated most recent copy scanned in chart (See row information)  Pre-existing out of facility DNR order (yellow form or pink MOST form) Pink MOST form placed in chart (order not valid for inpatient use);Yellow form placed in chart (order not valid for inpatient use)     Chief Complaint  Patient presents with   Medical Management of Chronic Issues    HPI:  Pt is a 89 y.o. female seen today for medical management of chronic diseases.    She currently resides on the skilled nursing unit at [facility]. Past medical includes: HTN, constipation, T2DM, hypothyroidism, OA, urinary retention, vascular dementia, depression and anxiety.    Furuncle- located in suprapubic, 12/17 doxycycline started BID Left arm skin tear- located on anterior forearm, injury not reported per chart review, denies pain  Dementia- followed by hospice> enrolled 08/2022, BIMS score 02/15 (10/2022), CT head 04/2020 age related atrophy and chronic small vessel ischemia, no behaviors, dependent with ADLs except feeding, remains on Depakote and  Seroquel  T2DM- A1c 8.8 (10/31), 7.2 (03/19)> was 6.5 (12/22), no hypoglycemic events, allergy to metformin, 11/18 started on glipizide Hypothyroidism- TSH 2.18 07/02/2022, remains on levothyroxine Depression- remains on Zoloft, Na+ 137 08/31/2022 HTN- BUN/creat 16/0.7 08/31/2022, remains on metoprolol Dysphagia- no recent aspirations, on chopped diet   Recent blood pressures:  12/17- 137/74, 127/73  12/10- 147/82  Recent weights:  12/01- 153.1 lbs  11/25- 155.3 lbs  10/01- 152.4 lbs   Past Medical History:  Diagnosis Date   Burst fracture of lumbar vertebra, sequela 12/27/2019   Closed left hip fracture (HCC) 04/07/2020   Diabetes mellitus type 2 in obese 11/09/2017   12/13/19 Hgb a1c 6.8, Na 141, K 4.3, Bun 16, creat 0.78, eGFR 67, wbc 9.6, Hgb 12.6, plt 318, neutrophils 60.9%, LDL 51  01/10/20 wbc 14.3, Hgb 13.6, plt 523, neutrophils 70.5%, Na 142, K 3.3, Bun 27, creat 0.62, eGFR 80  01/31/20 eye exam when the patient is able.   01/08/21 Hgb a1c 5.5   Fall as cause of accidental injury in residential institution as place of occurrence 04/07/2020   Hearing loss    History of fracture of clavicle    Hypokalemia 01/21/2020   01/21/20 Na 141, K 4.1, Bun 12, creat 0.65, eGFR 79, wbc 9.2, Hgb 12.7, plt 331   Hypothyroidism    Insomnia    Right lower lobe pneumonia 01/14/2020   Tachycardia 04/03/2019   Tinnitus    Unspecified fracture of the lower end of left radius, initial encounter for closed fracture 04/07/2020   Past Surgical History:  Procedure Laterality Date  CHOLECYSTECTOMY  2007   Dr. Alan Mulder   CLAVICLE EXCISION  1986   INTRAMEDULLARY (IM) NAIL INTERTROCHANTERIC Left 04/09/2020   Procedure: LEFT INTERTROCHANTERIC INTRAMEDULLARY (IM) NAIL;  Surgeon: Tarry Kos, MD;  Location: [facility] OR;  Service: Orthopedics;  Laterality: Left;   MOHS SURGERY     past 10 years chest & legs   OPEN REDUCTION INTERNAL FIXATION (ORIF) DISTAL RADIAL FRACTURE Left 04/09/2020   Procedure: CLOSED  REDUCTION LEFT DISTAL RADIUS FRACTURE;  Surgeon: Tarry Kos, MD;  Location: [facility] OR;  Service: Orthopedics;  Laterality: Left;    Allergies  Allergen Reactions   Metformin And Related Diarrhea    Outpatient Encounter Medications as of 02/18/2023  Medication Sig   acetaminophen (TYLENOL) 325 MG tablet Take 650 mg by mouth every 8 (eight) hours as needed.   DIMETHICONE, TOPICAL, (CERAVE BABY) 1 % LOTN Apply 1 Application topically daily. Apply to shoulders, back, and chest.   divalproex (DEPAKOTE SPRINKLE) 125 MG capsule Take 1 capsule (125 mg total) by mouth daily.   docusate sodium (COLACE) 100 MG capsule Take 100 mg by mouth daily.   doxycycline (VIBRA-TABS) 100 MG tablet Take 1 tablet (100 mg total) by mouth 2 (two) times daily for 7 days.   Emollient (CERAVE) LOTN Apply 1 Application topically as directed. Apply to back and chest topically every shift every 2 days for dry skin.   Emollient (CERAVE) LOTN Apply 1 Application topically daily. Apply to back and chest.   glipiZIDE 2.5 MG TABS Take 2.5 mg by mouth every morning.   hydrocortisone cream 1 % Apply 1 Application topically as needed for itching.   hydrocortisone cream 1 % Apply 1 Application topically every 12 (twelve) hours as needed for itching.   ipratropium-albuterol (DUONEB) 0.5-2.5 (3) MG/3ML SOLN Take 3 mLs by nebulization every 6 (six) hours as needed.   levothyroxine (SYNTHROID) 75 MCG tablet TAKE 1 TABLET (75 MCG TOTAL) BY MOUTH DAILY BEFORE BREAKFAST.   metoprolol tartrate (LOPRESSOR) 25 MG tablet Take 12.5 mg by mouth 2 (two) times daily.   nystatin powder Apply 1 Application topically daily as needed.   ondansetron (ZOFRAN) 4 MG tablet Take 4 mg by mouth every 12 (twelve) hours as needed for nausea or vomiting.   OXYGEN 2lpm as needed   polyethylene glycol (MIRALAX / GLYCOLAX) 17 g packet Take 17 g by mouth daily as needed for moderate constipation.   QUEtiapine (SEROQUEL) 25 MG tablet Take 37.5 mg by mouth daily at  2 PM. Hold 09/13- 09/18 while on paxlovid   sertraline (ZOLOFT) 50 MG tablet Take 75 mg by mouth daily.   Sodium Fluoride (PREVIDENT 5000 BOOSTER PLUS) 1.1 % PSTE Place 1 Application onto teeth in the morning and at bedtime.   zinc oxide 20 % ointment Apply 1 application topically as needed for irritation.   [DISCONTINUED] divalproex (DEPAKOTE) 125 MG DR tablet Take 125 mg by mouth daily.   No facility-administered encounter medications on file as of 02/18/2023.    Review of Systems  Unable to perform ROS: Dementia    Immunization History  Administered Date(s) Administered   DTaP 07/20/2013   Fluad Quad(high Dose 65+) 12/23/2021, 12/29/2022   Influenza, High Dose Seasonal PF 12/07/2016   Influenza,inj,Quad PF,6+ Mos 12/01/2017   Influenza-Unspecified 11/20/2014, 12/12/2015, 11/20/2019   Moderna Covid-19 Fall Seasonal Vaccine 76yrs & older 01/05/2023   Moderna SARS-COV2 Booster Vaccination 08/06/2020   Moderna Sars-Covid-2 Vaccination 03/05/2019, 04/02/2019   PFIZER(Purple Top)SARS-COV-2 Vaccination 04/01/2020, 08/06/2020, 11/19/2020, 01/05/2022  PPD Test 08/06/2014   Pneumococcal Conjugate-13 11/07/2013   Pneumococcal Polysaccharide-23 03/22/2017   Tetanus 03/02/2015   Zoster Recombinant(Shingrix) 06/07/2017, 08/26/2017   Zoster, Live 03/01/2006   Pertinent  Health Maintenance Due  Topic Date Due   OPHTHALMOLOGY EXAM  11/19/2022   HEMOGLOBIN A1C  06/30/2023   FOOT EXAM  12/24/2023   INFLUENZA VACCINE  Completed   DEXA SCAN  Completed      09/28/2021   10:30 AM 05/19/2022   12:28 PM 09/10/2022    2:05 PM 10/25/2022    9:27 AM 12/24/2022    2:09 PM  Fall Risk  Falls in the past year? 1 1 0 0 1  Was there an injury with Fall? 0 0 0 0 0  Fall Risk Category Calculator 2 1 0 0 1  Fall Risk Category (Retired) Moderate      (RETIRED) Patient Fall Risk Level Moderate fall risk      Patient at Risk for Falls Due to History of fall(s);Impaired balance/gait;Impaired mobility  History of fall(s);Impaired balance/gait;Impaired mobility History of fall(s);Impaired balance/gait;Impaired mobility History of fall(s);Impaired balance/gait;Impaired mobility History of fall(s);Impaired balance/gait;Impaired mobility  Fall risk Follow up Falls evaluation completed;Education provided;Falls prevention discussed Falls evaluation completed;Education provided;Falls prevention discussed Falls evaluation completed;Education provided;Falls prevention discussed Falls evaluation completed;Education provided;Falls prevention discussed Falls evaluation completed;Education provided;Falls prevention discussed   Functional Status Survey:    Vitals:   02/18/23 1227  BP: 137/74  Pulse: 87  Resp: 18  Temp: (!) 96.9 F (36.1 C)  SpO2: 94%  Weight: 153 lb 1.6 oz (69.4 kg)  Height: 5' (1.524 m)   Body mass index is 29.9 kg/m. Physical Exam Vitals reviewed.  Constitutional:      General: She is not in acute distress. HENT:     Head: Normocephalic.     Right Ear: There is no impacted cerumen.     Left Ear: There is no impacted cerumen.     Nose: Nose normal.     Mouth/Throat:     Mouth: Mucous membranes are moist.  Eyes:     General:        Right eye: No discharge.        Left eye: No discharge.  Cardiovascular:     Rate and Rhythm: Normal rate and regular rhythm.     Pulses: Normal pulses.     Heart sounds: Normal heart sounds.  Pulmonary:     Effort: Pulmonary effort is normal.     Breath sounds: Normal breath sounds.  Abdominal:     General: Bowel sounds are normal.     Palpations: Abdomen is soft.  Musculoskeletal:     Cervical back: Neck supple.     Right lower leg: No edema.     Left lower leg: No edema.  Skin:    General: Skin is warm.     Capillary Refill: Capillary refill takes less than 2 seconds.     Comments: Approx 0.5 cm round skin tear to left anterior forearm, reinforced with 2 steri strips, no sign of infection, unable to visualize furuncle    Neurological:     General: No focal deficit present.     Mental Status: She is alert. Mental status is at baseline.     Motor: Weakness present.     Gait: Gait abnormal.     Comments: wheelchair  Psychiatric:        Mood and Affect: Mood normal.     Labs reviewed: Recent Labs    04/23/22  0000 05/18/22 0000 08/31/22 0000  NA 137 142 137  K 4.4 4.4 3.9  CL 101 105 102  CO2 26* 28* 27*  BUN 22* 23* 16  CREATININE 0.7 0.7 0.7  CALCIUM 9.2 8.7 9.1   Recent Labs    04/23/22 0000 05/18/22 0000  AST 14 11*  ALT 10 13  ALKPHOS 95 100  ALBUMIN 3.8 3.4*   Recent Labs    04/23/22 0000 05/18/22 0000 08/31/22 0000 12/27/22 0000  WBC 10.3 9.8 9.6  --   NEUTROABS 6,489.00  --  7,085.00  --   HGB 13.4 12.4 12.6 12.3  HCT 41 38 39  --   PLT 280 259 262  --    Lab Results  Component Value Date   TSH 2.18 07/02/2022   Lab Results  Component Value Date   HGBA1C 8.8 12/31/2022   Lab Results  Component Value Date   CHOL 158 05/18/2022   HDL 37 05/18/2022   LDLCALC 93 05/18/2022   TRIG 187 (A) 05/18/2022   CHOLHDL 3.0 12/13/2019    Significant Diagnostic Results in last 30 days:  No results found.  Assessment/Plan 1. Furuncle (Primary) - noted 12/17 - started on doxycycline x 7 days - increased risk for skin infections due to T2DM   2. ISTAP type 1 skin tear of left forearm - noted 12/19 per chart review - no sign of infection - cont steri strips and foam dressing prn  3. Vascular dementia with behavior disturbance (HCC) - followed by hospice - dependent with ADLs except feeding - nonambulatory - weight stable - no behaviors - cont Depakote and Seroquel  4. Type 2 diabetes mellitus with hyperglycemia, without long-term current use of insulin (HCC) - recent A1c 8.8 - allergy to metformin - 11/18 glipizide started  - recent sugars 160-200's - check A1c in 3 months  5. Acquired hypothyroidism - TSH stable - cont levothyroxine  6. Recurrent  depression (HCC) - no mood changes - Na + stable - cont zoloft  7. Primary hypertension - controlled with metoprolol  8. Dysphagia, unspecified type - no recent aspirations - cont chopped diet    Family/ staff Communication: plan discussed with patient and nurse  Labs/tests ordered:  none

## 2023-02-20 DIAGNOSIS — F0153 Vascular dementia, unspecified severity, with mood disturbance: Secondary | ICD-10-CM | POA: Diagnosis not present

## 2023-02-20 DIAGNOSIS — I10 Essential (primary) hypertension: Secondary | ICD-10-CM | POA: Diagnosis not present

## 2023-02-20 DIAGNOSIS — J690 Pneumonitis due to inhalation of food and vomit: Secondary | ICD-10-CM | POA: Diagnosis not present

## 2023-02-20 DIAGNOSIS — I69818 Other symptoms and signs involving cognitive functions following other cerebrovascular disease: Secondary | ICD-10-CM | POA: Diagnosis not present

## 2023-02-20 DIAGNOSIS — E119 Type 2 diabetes mellitus without complications: Secondary | ICD-10-CM | POA: Diagnosis not present

## 2023-02-20 DIAGNOSIS — Z8781 Personal history of (healed) traumatic fracture: Secondary | ICD-10-CM | POA: Diagnosis not present

## 2023-02-21 DIAGNOSIS — I10 Essential (primary) hypertension: Secondary | ICD-10-CM | POA: Diagnosis not present

## 2023-02-21 DIAGNOSIS — I69818 Other symptoms and signs involving cognitive functions following other cerebrovascular disease: Secondary | ICD-10-CM | POA: Diagnosis not present

## 2023-02-21 DIAGNOSIS — J690 Pneumonitis due to inhalation of food and vomit: Secondary | ICD-10-CM | POA: Diagnosis not present

## 2023-02-21 DIAGNOSIS — Z8781 Personal history of (healed) traumatic fracture: Secondary | ICD-10-CM | POA: Diagnosis not present

## 2023-02-21 DIAGNOSIS — E119 Type 2 diabetes mellitus without complications: Secondary | ICD-10-CM | POA: Diagnosis not present

## 2023-02-21 DIAGNOSIS — F0153 Vascular dementia, unspecified severity, with mood disturbance: Secondary | ICD-10-CM | POA: Diagnosis not present

## 2023-02-25 DIAGNOSIS — I10 Essential (primary) hypertension: Secondary | ICD-10-CM | POA: Diagnosis not present

## 2023-02-25 DIAGNOSIS — J690 Pneumonitis due to inhalation of food and vomit: Secondary | ICD-10-CM | POA: Diagnosis not present

## 2023-02-25 DIAGNOSIS — F0153 Vascular dementia, unspecified severity, with mood disturbance: Secondary | ICD-10-CM | POA: Diagnosis not present

## 2023-02-25 DIAGNOSIS — Z8781 Personal history of (healed) traumatic fracture: Secondary | ICD-10-CM | POA: Diagnosis not present

## 2023-02-25 DIAGNOSIS — E119 Type 2 diabetes mellitus without complications: Secondary | ICD-10-CM | POA: Diagnosis not present

## 2023-02-25 DIAGNOSIS — I69818 Other symptoms and signs involving cognitive functions following other cerebrovascular disease: Secondary | ICD-10-CM | POA: Diagnosis not present

## 2023-02-28 DIAGNOSIS — E119 Type 2 diabetes mellitus without complications: Secondary | ICD-10-CM | POA: Diagnosis not present

## 2023-02-28 DIAGNOSIS — I10 Essential (primary) hypertension: Secondary | ICD-10-CM | POA: Diagnosis not present

## 2023-02-28 DIAGNOSIS — I69818 Other symptoms and signs involving cognitive functions following other cerebrovascular disease: Secondary | ICD-10-CM | POA: Diagnosis not present

## 2023-02-28 DIAGNOSIS — F0153 Vascular dementia, unspecified severity, with mood disturbance: Secondary | ICD-10-CM | POA: Diagnosis not present

## 2023-02-28 DIAGNOSIS — Z8781 Personal history of (healed) traumatic fracture: Secondary | ICD-10-CM | POA: Diagnosis not present

## 2023-02-28 DIAGNOSIS — J690 Pneumonitis due to inhalation of food and vomit: Secondary | ICD-10-CM | POA: Diagnosis not present

## 2023-03-24 ENCOUNTER — Non-Acute Institutional Stay (SKILLED_NURSING_FACILITY): Payer: Self-pay | Admitting: Internal Medicine

## 2023-03-24 DIAGNOSIS — F01518 Vascular dementia, unspecified severity, with other behavioral disturbance: Secondary | ICD-10-CM

## 2023-03-24 DIAGNOSIS — I10 Essential (primary) hypertension: Secondary | ICD-10-CM

## 2023-03-24 DIAGNOSIS — E039 Hypothyroidism, unspecified: Secondary | ICD-10-CM | POA: Diagnosis not present

## 2023-03-24 DIAGNOSIS — F339 Major depressive disorder, recurrent, unspecified: Secondary | ICD-10-CM | POA: Diagnosis not present

## 2023-03-24 DIAGNOSIS — E1165 Type 2 diabetes mellitus with hyperglycemia: Secondary | ICD-10-CM

## 2023-03-24 DIAGNOSIS — R1310 Dysphagia, unspecified: Secondary | ICD-10-CM

## 2023-03-24 NOTE — Progress Notes (Signed)
[facility]  [facility]:  SNF [phone])  Provider:   Code Status: DNR Lisa Castillo  Goals of Care:     02/15/2023   10:08 AM  Advanced Directives  Does Patient Have a Medical Advance Directive? Yes  Type of Estate agent of [place];Living will;Out of facility DNR (pink MOST or yellow form)  Does patient want to make changes to medical advance directive? No - Patient declined  Copy of Healthcare Power of Attorney in Chart? Yes - validated most recent copy scanned in chart (See row information)  Pre-existing out of facility DNR order (yellow form or pink MOST form) Pink MOST form placed in chart (order not valid for inpatient use);Yellow form placed in chart (order not valid for inpatient use)     Chief Complaint  Patient presents with   Care Management    HPI: [name] a 89 y.o. female seen today for medical management of chronic diseases.    [name] Resident of SNF    Patient has a history of type 2 diabetes, hyperlipidemia, depression, dementia, hypothyroidism, osteopenia, stress and urge urinary incontinence, Tachycardia  h/o Hip fracture and Compression fracture after falls   Enrolled in Hospice  She is stable. No new Nursing issues. No Behavior issues Her weight is stable Hoyer and Wheelchair Dependent No Falls Wt Readings from Last 3 Encounters:  03/27/23 153 lb (69.4 kg)  02/18/23 153 lb 1.6 oz (69.4 kg)  02/15/23 153 lb 1.6 oz (69.4 kg)   Past Medical History:  Diagnosis Date   Burst fracture of lumbar vertebra, sequela 12/27/2019   Closed left hip fracture (HCC) 04/07/2020   Diabetes mellitus type 2 in obese 11/09/2017   12/13/19 Hgb a1c 6.8, Na 141, K 4.3, Bun 16, creat 0.78, eGFR 67, wbc 9.6, Hgb 12.6, plt 318, neutrophils 60.9%, LDL 51  01/10/20 wbc 14.3, Hgb 13.6, plt 523, neutrophils 70.5%, Na 142, K 3.3, Bun 27, creat 0.62, eGFR 80  01/31/20 eye exam when the patient is able.   01/08/21 Hgb a1c 5.5   Fall as  cause of accidental injury in residential institution as place of occurrence 04/07/2020   Hearing loss    History of fracture of clavicle    Hypokalemia 01/21/2020   01/21/20 Na 141, K 4.1, Bun 12, creat 0.65, eGFR 79, wbc 9.2, Hgb 12.7, plt 331   Hypothyroidism    Insomnia    Right lower lobe pneumonia 01/14/2020   Tachycardia 04/03/2019   Tinnitus    Unspecified fracture of the lower end of left radius, initial encounter for closed fracture 04/07/2020    Past Surgical History:  Procedure Laterality Date   CHOLECYSTECTOMY  2007   Dr. Alan Mulder   CLAVICLE EXCISION  1986   INTRAMEDULLARY (IM) NAIL INTERTROCHANTERIC Left 04/09/2020   Procedure: LEFT INTERTROCHANTERIC INTRAMEDULLARY (IM) NAIL;  Surgeon: Tarry Kos, MD;  Location: [facility] OR;  Service: Orthopedics;  Laterality: Left;   MOHS SURGERY     past 10 years chest & legs   OPEN REDUCTION INTERNAL FIXATION (ORIF) DISTAL RADIAL FRACTURE Left 04/09/2020   Procedure: CLOSED REDUCTION LEFT DISTAL RADIUS FRACTURE;  Surgeon: Tarry Kos, MD;  Location: [facility] OR;  Service: Orthopedics;  Laterality: Left;    Allergies  Allergen Reactions   Metformin And Related Diarrhea    Outpatient Encounter Medications as of 03/24/2023  Medication Sig   acetaminophen (TYLENOL) 325 MG tablet Take 650 mg by mouth every 8 (eight) hours as needed.  DIMETHICONE, TOPICAL, (CERAVE BABY) 1 % LOTN Apply 1 Application topically daily. Apply to shoulders, back, and chest.   divalproex (DEPAKOTE SPRINKLE) 125 MG capsule Take 1 capsule (125 mg total) by mouth daily.   docusate sodium (COLACE) 100 MG capsule Take 100 mg by mouth daily.   Emollient (CERAVE) LOTN Apply 1 Application topically as directed. Apply to back and chest topically every shift every 2 days for dry skin.   Emollient (CERAVE) LOTN Apply 1 Application topically daily. Apply to back and chest.   glipiZIDE 2.5 MG TABS Take 2.5 mg by mouth every morning.   hydrocortisone cream 1 % Apply 1 Application  topically as needed for itching.   hydrocortisone cream 1 % Apply 1 Application topically every 12 (twelve) hours as needed for itching.   ipratropium-albuterol (DUONEB) 0.5-2.5 (3) MG/3ML SOLN Take 3 mLs by nebulization every 6 (six) hours as needed.   levothyroxine (SYNTHROID) 75 MCG tablet TAKE 1 TABLET (75 MCG TOTAL) BY MOUTH DAILY BEFORE BREAKFAST.   metoprolol tartrate (LOPRESSOR) 25 MG tablet Take 12.5 mg by mouth 2 (two) times daily.   nystatin powder Apply 1 Application topically daily as needed.   ondansetron (ZOFRAN) 4 MG tablet Take 4 mg by mouth every 12 (twelve) hours as needed for nausea or vomiting.   OXYGEN 2lpm as needed   polyethylene glycol (MIRALAX / GLYCOLAX) 17 g packet Take 17 g by mouth daily as needed for moderate constipation.   QUEtiapine (SEROQUEL) 25 MG tablet Take 37.5 mg by mouth daily at 2 PM. Hold 09/13- 09/18 while on paxlovid   sertraline (ZOLOFT) 50 MG tablet Take 75 mg by mouth daily.   Sodium Fluoride (PREVIDENT 5000 BOOSTER PLUS) 1.1 % PSTE Place 1 Application onto teeth in the morning and at bedtime.   zinc oxide 20 % ointment Apply 1 application topically as needed for irritation.   [DISCONTINUED] divalproex (DEPAKOTE) 125 MG DR tablet Take 125 mg by mouth daily.   No facility-administered encounter medications on file as of 03/24/2023.    Review of Systems:  Review of Systems  Unable to perform ROS: Dementia    Health Maintenance  Topic Date Due   OPHTHALMOLOGY EXAM  11/19/2022   COVID-19 Vaccine (8 - 2024-25 season) 03/02/2023   HEMOGLOBIN A1C  06/30/2023   FOOT EXAM  12/24/2023   DTaP/Tdap/Td (3 - Tdap) 03/01/2025   Pneumonia Vaccine 54+ Years old  Completed   INFLUENZA VACCINE  Completed   DEXA SCAN  Completed   Zoster Vaccines- Shingrix  Completed   HPV VACCINES  Aged Out    Physical Exam: Vitals:   03/27/23 1606  BP: (!) 147/80  Pulse: 92  Resp: 16  Temp: (!) 97 F (36.1 C)  Weight: 153 lb (69.4 kg)   Body mass index is  29.88 kg/m. Physical Exam Vitals reviewed.  Constitutional:      Appearance: Normal appearance.  HENT:     Head: Normocephalic.     Nose: Nose normal.     Mouth/Throat:     Mouth: Mucous membranes are moist.     Pharynx: Oropharynx is clear.  Eyes:     Pupils: Pupils are equal, round, and reactive to light.  Cardiovascular:     Rate and Rhythm: Normal rate and regular rhythm.     Pulses: Normal pulses.     Heart sounds: Normal heart sounds. No murmur heard. Pulmonary:     Effort: Pulmonary effort is normal.     Breath sounds: Normal breath  sounds.  Abdominal:     General: Abdomen is flat. Bowel sounds are normal.     Palpations: Abdomen is soft.  Musculoskeletal:        General: No swelling.     Cervical back: Neck supple.  Skin:    General: Skin is warm.  Neurological:     General: No focal deficit present.     Mental Status: She is alert.  Psychiatric:        Mood and Affect: Mood normal.        Thought Content: Thought content normal.     Labs reviewed: Basic Metabolic Panel: Recent Labs    04/23/22 0000 05/18/22 0000 07/02/22 0000 08/31/22 0000  NA 137 142  --  137  K 4.4 4.4  --  3.9  CL 101 105  --  102  CO2 26* 28*  --  27*  BUN 22* 23*  --  16  CREATININE 0.7 0.7  --  0.7  CALCIUM 9.2 8.7  --  9.1  TSH  --   --  2.18  --    Liver Function Tests: Recent Labs    04/23/22 0000 05/18/22 0000  AST 14 11*  ALT 10 13  ALKPHOS 95 100  ALBUMIN 3.8 3.4*   No results for input(s): "LIPASE", "AMYLASE" in the last 8760 hours. No results for input(s): "AMMONIA" in the last 8760 hours. CBC: Recent Labs    04/23/22 0000 05/18/22 0000 08/31/22 0000 12/27/22 0000  WBC 10.3 9.8 9.6  --   NEUTROABS 6,489.00  --  7,085.00  --   HGB 13.4 12.4 12.6 12.3  HCT 41 38 39  --   PLT 280 259 262  --    Lipid Panel: Recent Labs    04/23/22 0000 05/18/22 0000  CHOL 169 158  HDL 38 37  LDLCALC 95 93  TRIG 236* 187*   Lab Results  Component Value Date    HGBA1C 8.8 12/31/2022    Procedures since last visit: No results found.  Assessment/Plan 1. Vascular dementia with behavior disturbance (HCC) (Primary) Weight stable Enrolled in Hospice Stable on Low dos eof Seroquel  2. Type 2 diabetes mellitus with hyperglycemia, without long-term current use of insulin (HCC) On Low dose of Glipizide for High CBGS  3. Acquired hypothyroidism TSH stable in 5/24  4. Recurrent depression (HCC) Zoloft  5. Primary hypertension with tachycardia Low dose of Lopressor  6. Dysphagia, unspecified type Chopped Needs help with Feeding some    Labs/tests ordered:   Next appt:  Visit date not found

## 2023-03-27 ENCOUNTER — Encounter: Payer: Self-pay | Admitting: Internal Medicine

## 2023-04-20 ENCOUNTER — Encounter: Payer: Self-pay | Admitting: Orthopedic Surgery

## 2023-04-20 ENCOUNTER — Non-Acute Institutional Stay (SKILLED_NURSING_FACILITY): Payer: Self-pay | Admitting: Orthopedic Surgery

## 2023-04-20 DIAGNOSIS — E039 Hypothyroidism, unspecified: Secondary | ICD-10-CM

## 2023-04-20 DIAGNOSIS — I10 Essential (primary) hypertension: Secondary | ICD-10-CM

## 2023-04-20 DIAGNOSIS — F01518 Vascular dementia, unspecified severity, with other behavioral disturbance: Secondary | ICD-10-CM

## 2023-04-20 DIAGNOSIS — R1310 Dysphagia, unspecified: Secondary | ICD-10-CM

## 2023-04-20 DIAGNOSIS — F339 Major depressive disorder, recurrent, unspecified: Secondary | ICD-10-CM

## 2023-04-20 DIAGNOSIS — E1165 Type 2 diabetes mellitus with hyperglycemia: Secondary | ICD-10-CM

## 2023-04-20 NOTE — Progress Notes (Signed)
Location:   [facility]  Nursing Home Room Number: 39-A Place of Service:  SNF [phone]) Provider:  Hazle Nordmann, NP  PCP: Mahlon Gammon, MD  Patient Care Team: Mahlon Gammon, MD as PCP - General (Internal Medicine)  Extended Emergency Contact Information Primary Emergency Contact: Harris,Margaret Address: [address]          [place], [place] [address] [place] of [place] Home Phone: [phone] Mobile Phone: [phone] Relation: Daughter  Code Status:  DNR Goals of care: Advanced Directive information    04/20/2023    9:59 AM  Advanced Directives  Does Patient Have a Medical Advance Directive? Yes  Type of Estate agent of [place];Living will;Out of facility DNR (pink MOST or yellow form)  Does patient want to make changes to medical advance directive? No - Patient declined  Copy of Healthcare Power of Attorney in Chart? Yes - validated most recent copy scanned in chart (See row information)     Chief Complaint  Patient presents with   Medical Management of Chronic Issues    Routine Visit.   Immunizations    Discuss the need for [employer].    Health Maintenance    Discuss the need for Eye exam.     HPI:  Pt is a 89 y.o. female seen today for medical management of chronic diseases.    She currently resides on the skilled nursing unit at [facility]. Past medical includes: HTN, constipation, T2DM, hypothyroidism, OA, urinary retention, vascular dementia, depression and anxiety.    Dementia- followed by hospice> enrolled 08/2022, unable to complete BIMS/ MMSE, CT head 04/2020 age related atrophy and chronic small vessel ischemia, no behaviors, dependent with ADLs except feeding, remains on Seroquel, Depakote stopped per Hospice T2DM- A1c 8.8 (10/31), 7.2 (03/19)> was 6.5 (12/22), recent sugars 180-200's, no hypoglycemic events, allergy to metformin, remains on glipizide Hypothyroidism- TSH 2.18 07/02/2022, remains on  levothyroxine Depression- remains on Zoloft, Na+ 137 08/31/2022 HTN- BUN/creat 16/0.7 08/31/2022, remains on metoprolol Dysphagia- no recent aspirations, on chopped diet  Recent blood pressures:  02/18- 135/89  02/11- 140/81  02/04- 148/79  Recent weights:  02/05- 151.3 lbs  12/01- 153.1 lbs  10/01- 152.4 lbs   Past Medical History:  Diagnosis Date   Burst fracture of lumbar vertebra, sequela 12/27/2019   Closed left hip fracture (HCC) 04/07/2020   Diabetes mellitus type 2 in obese 11/09/2017   12/13/19 Hgb a1c 6.8, Na 141, K 4.3, Bun 16, creat 0.78, eGFR 67, wbc 9.6, Hgb 12.6, plt 318, neutrophils 60.9%, LDL 51  01/10/20 wbc 14.3, Hgb 13.6, plt 523, neutrophils 70.5%, Na 142, K 3.3, Bun 27, creat 0.62, eGFR 80  01/31/20 eye exam when the patient is able.   01/08/21 Hgb a1c 5.5   Fall as cause of accidental injury in residential institution as place of occurrence 04/07/2020   Hearing loss    History of fracture of clavicle    Hypokalemia 01/21/2020   01/21/20 Na 141, K 4.1, Bun 12, creat 0.65, eGFR 79, wbc 9.2, Hgb 12.7, plt 331   Hypothyroidism    Insomnia    Right lower lobe pneumonia 01/14/2020   Tachycardia 04/03/2019   Tinnitus    Unspecified fracture of the lower end of left radius, initial encounter for closed fracture 04/07/2020   Past Surgical History:  Procedure Laterality Date   CHOLECYSTECTOMY  2007   Dr. Alan Mulder   CLAVICLE EXCISION  1986   INTRAMEDULLARY (IM) NAIL INTERTROCHANTERIC Left 04/09/2020  Procedure: LEFT INTERTROCHANTERIC INTRAMEDULLARY (IM) NAIL;  Surgeon: Tarry Kos, MD;  Location: [facility] OR;  Service: Orthopedics;  Laterality: Left;   MOHS SURGERY     past 10 years chest & legs   OPEN REDUCTION INTERNAL FIXATION (ORIF) DISTAL RADIAL FRACTURE Left 04/09/2020   Procedure: CLOSED REDUCTION LEFT DISTAL RADIUS FRACTURE;  Surgeon: Tarry Kos, MD;  Location: [facility] OR;  Service: Orthopedics;  Laterality: Left;    Allergies  Allergen Reactions   Metformin And  Related Diarrhea    Allergies as of 04/20/2023       Reactions   Metformin And Related Diarrhea        Medication List        Accurate as of April 20, 2023 10:00 AM. If you have any questions, ask your nurse or doctor.          acetaminophen 325 MG tablet Commonly known as: TYLENOL Take 650 mg by mouth every 8 (eight) hours as needed.   CeraVe Baby 1 % Lotn Generic drug: DIMETHICONE (TOPICAL) Apply 1 Application topically daily. Apply to shoulders, back, and chest.   CeraVe Lotn Apply 1 Application topically as directed. Apply to back and chest topically every shift every 2 days for dry skin.   CeraVe Lotn Apply 1 Application topically daily. Apply to back and chest.   docusate sodium 100 MG capsule Commonly known as: COLACE Take 100 mg by mouth daily.   glipiZIDE 2.5 MG Tabs Take 2.5 mg by mouth every morning.   hydrocortisone cream 1 % Apply 1 Application topically as needed for itching.   hydrocortisone cream 1 % Apply 1 Application topically every 12 (twelve) hours as needed for itching.   ipratropium-albuterol 0.5-2.5 (3) MG/3ML Soln Commonly known as: DUONEB Take 3 mLs by nebulization every 6 (six) hours as needed.   levothyroxine 75 MCG tablet Commonly known as: SYNTHROID TAKE 1 TABLET (75 MCG TOTAL) BY MOUTH DAILY BEFORE BREAKFAST.   metoprolol tartrate 25 MG tablet Commonly known as: LOPRESSOR Take 12.5 mg by mouth 2 (two) times daily.   nystatin powder Apply 1 Application topically daily as needed.   ondansetron 4 MG tablet Commonly known as: ZOFRAN Take 4 mg by mouth every 12 (twelve) hours as needed for nausea or vomiting.   OXYGEN 2lpm as needed   polyethylene glycol 17 g packet Commonly known as: MIRALAX / GLYCOLAX Take 17 g by mouth daily as needed for moderate constipation.   PreviDent 5000 Booster Plus 1.1 % Pste Generic drug: Sodium Fluoride Place 1 Application onto teeth in the morning and at bedtime.   QUEtiapine 25  MG tablet Commonly known as: SEROQUEL Take 37.5 mg by mouth daily at 2 PM. Hold 09/13- 09/18 while on paxlovid   sertraline 50 MG tablet Commonly known as: ZOLOFT Take 75 mg by mouth daily.   zinc oxide 20 % ointment Apply 1 application topically as needed for irritation.        Review of Systems  Unable to perform ROS: Dementia    Immunization History  Administered Date(s) Administered   DTaP 07/20/2013   Fluad Quad(high Dose 65+) 12/23/2021, 12/29/2022   Influenza, High Dose Seasonal PF 12/07/2016   Influenza,inj,Quad PF,6+ Mos 12/01/2017   Influenza-Unspecified 11/20/2014, 12/12/2015, 11/20/2019   Moderna Covid-19 Fall Seasonal Vaccine 29yrs & older 01/05/2023   Moderna SARS-COV2 Booster Vaccination 08/06/2020   Moderna Sars-Covid-2 Vaccination 03/05/2019, 04/02/2019   PFIZER(Purple Top)SARS-COV-2 Vaccination 04/01/2020, 08/06/2020, 11/19/2020, 01/05/2022   PPD Test 08/06/2014   Pneumococcal  Conjugate-13 11/07/2013   Pneumococcal Polysaccharide-23 03/22/2017   Tetanus 03/02/2015   Zoster Recombinant(Shingrix) 06/07/2017, 08/26/2017   Zoster, Live 03/01/2006   Pertinent  Health Maintenance Due  Topic Date Due   OPHTHALMOLOGY EXAM  11/19/2022   HEMOGLOBIN A1C  06/30/2023   FOOT EXAM  12/24/2023   INFLUENZA VACCINE  Completed   DEXA SCAN  Completed      09/28/2021   10:30 AM 05/19/2022   12:28 PM 09/10/2022    2:05 PM 10/25/2022    9:27 AM 12/24/2022    2:09 PM  Fall Risk  Falls in the past year? 1 1 0 0 1  Was there an injury with Fall? 0 0 0 0 0  Fall Risk Category Calculator 2 1 0 0 1  Fall Risk Category (Retired) Moderate      (RETIRED) Patient Fall Risk Level Moderate fall risk      Patient at Risk for Falls Due to History of fall(s);Impaired balance/gait;Impaired mobility History of fall(s);Impaired balance/gait;Impaired mobility History of fall(s);Impaired balance/gait;Impaired mobility History of fall(s);Impaired balance/gait;Impaired mobility History of  fall(s);Impaired balance/gait;Impaired mobility  Fall risk Follow up Falls evaluation completed;Education provided;Falls prevention discussed Falls evaluation completed;Education provided;Falls prevention discussed Falls evaluation completed;Education provided;Falls prevention discussed Falls evaluation completed;Education provided;Falls prevention discussed Falls evaluation completed;Education provided;Falls prevention discussed   Functional Status Survey:    Vitals:   04/20/23 0953  BP: 135/89  Pulse: 100  Resp: 16  Temp: 97.6 F (36.4 C)  SpO2: 98%  Weight: 151 lb 4.8 oz (68.6 kg)  Height: 5' (1.524 m)   Body mass index is 29.55 kg/m. Physical Exam Vitals reviewed.  Constitutional:      General: She is not in acute distress. HENT:     Head: Normocephalic.     Right Ear: There is no impacted cerumen.     Left Ear: There is no impacted cerumen.     Nose: Nose normal.     Mouth/Throat:     Mouth: Mucous membranes are moist.  Eyes:     General:        Right eye: No discharge.        Left eye: No discharge.  Cardiovascular:     Rate and Rhythm: Normal rate and regular rhythm.     Pulses: Normal pulses.     Heart sounds: Normal heart sounds.  Pulmonary:     Effort: Pulmonary effort is normal.     Breath sounds: Normal breath sounds.  Abdominal:     General: Bowel sounds are normal. There is no distension.     Palpations: Abdomen is soft.     Tenderness: There is no abdominal tenderness.  Musculoskeletal:     Cervical back: Neck supple.     Right lower leg: No edema.     Left lower leg: No edema.  Skin:    General: Skin is warm.     Capillary Refill: Capillary refill takes less than 2 seconds.  Neurological:     General: No focal deficit present.     Mental Status: She is alert. Mental status is at baseline.     Motor: Weakness present.     Gait: Gait abnormal.     Comments: Hoyer transfer  Psychiatric:        Mood and Affect: Mood normal.     Comments:  Aphasia, very pleasant, alert to self/person, follows commands     Labs reviewed: Recent Labs    04/23/22 0000 05/18/22 0000 08/31/22 0000  NA 137  142 137  K 4.4 4.4 3.9  CL 101 105 102  CO2 26* 28* 27*  BUN 22* 23* 16  CREATININE 0.7 0.7 0.7  CALCIUM 9.2 8.7 9.1   Recent Labs    04/23/22 0000 05/18/22 0000  AST 14 11*  ALT 10 13  ALKPHOS 95 100  ALBUMIN 3.8 3.4*   Recent Labs    04/23/22 0000 05/18/22 0000 08/31/22 0000 12/27/22 0000  WBC 10.3 9.8 9.6  --   NEUTROABS 6,489.00  --  7,085.00  --   HGB 13.4 12.4 12.6 12.3  HCT 41 38 39  --   PLT 280 259 262  --    Lab Results  Component Value Date   TSH 2.18 07/02/2022   Lab Results  Component Value Date   HGBA1C 8.8 12/31/2022   Lab Results  Component Value Date   CHOL 158 05/18/2022   HDL 37 05/18/2022   LDLCALC 93 05/18/2022   TRIG 187 (A) 05/18/2022   CHOLHDL 3.0 12/13/2019    Significant Diagnostic Results in last 30 days:  No results found.  Assessment/Plan 1. Vascular dementia with behavior disturbance (HCC) (Primary) - followed by hospice - no behaviors - Depakote stopped by Hospice - dependent with ADLs except feeding - cont Seroquel  2. Type 2 diabetes mellitus with hyperglycemia, without long-term current use of insulin (HCC) - Last A1c 8.8 - sugars 180-200's, no hypoglycemia - cont glipizide  3. Acquired hypothyroidism - TSH stable - cont levothyroxine  4. Recurrent depression (HCC) - no changes in mood - Na+ stable - cont Zoloft  5. Primary hypertension - controlled with metoprolol  6. Dysphagia, unspecified type - no recent aspirations - cont chopped diet    Family/ staff Communication: plan discussed with patient and nurse  Labs/tests ordered:  none

## 2023-05-20 ENCOUNTER — Encounter: Payer: Self-pay | Admitting: Orthopedic Surgery

## 2023-05-20 ENCOUNTER — Non-Acute Institutional Stay (SKILLED_NURSING_FACILITY): Payer: Self-pay | Admitting: Orthopedic Surgery

## 2023-05-20 DIAGNOSIS — F339 Major depressive disorder, recurrent, unspecified: Secondary | ICD-10-CM

## 2023-05-20 DIAGNOSIS — E1165 Type 2 diabetes mellitus with hyperglycemia: Secondary | ICD-10-CM

## 2023-05-20 DIAGNOSIS — E039 Hypothyroidism, unspecified: Secondary | ICD-10-CM

## 2023-05-20 DIAGNOSIS — I10 Essential (primary) hypertension: Secondary | ICD-10-CM | POA: Diagnosis not present

## 2023-05-20 DIAGNOSIS — F01518 Vascular dementia, unspecified severity, with other behavioral disturbance: Secondary | ICD-10-CM

## 2023-05-20 DIAGNOSIS — R1310 Dysphagia, unspecified: Secondary | ICD-10-CM

## 2023-05-20 NOTE — Progress Notes (Addendum)
 Location:  [facility] Room Number: 39/A Place of Service:  SNF [id number]) Provider:  Octavia Heir, NP   Mahlon Gammon, MD  Patient Care Team: Mahlon Gammon, MD as PCP - General (Internal Medicine)  Extended Emergency Contact Information Primary Emergency Contact: Harris,Margaret Address: [address]          [place], [place] [address] [place] of [place] Home Phone: [phone] Mobile Phone: [phone] Relation: Daughter  Code Status:  DNR Goals of care: Advanced Directive information    04/20/2023    9:59 AM  Advanced Directives  Does Patient Have a Medical Advance Directive? Yes  Type of Estate agent of [place];Living will;Out of facility DNR (pink MOST or yellow form)  Does patient want to make changes to medical advance directive? No - Patient declined  Copy of Healthcare Power of Attorney in Chart? Yes - validated most recent copy scanned in chart (See row information)     Chief Complaint  Patient presents with   Medical Management of Chronic Issues    HPI:  Pt is a 89 y.o. female seen today for medical management of chronic diseases.    She currently resides on the skilled nursing unit at [facility]. Past medical includes: HTN, constipation, T2DM, hypothyroidism, OA, urinary retention, vascular dementia, depression and anxiety.    Daughter present during encounter.    Dementia- followed by hospice> enrolled 08/2022, unable to complete BIMS/ MMSE, CT head 04/2020 age related atrophy and chronic small vessel ischemia, sometimes yells in afternoon>no agitation, dependent with ADLs except feeding, remains on Seroquel T2DM- A1c 8.8 (10/31), 7.2 (03/19)> was 6.5 (12/22), recent sugars 150-200's, no hypoglycemic events, allergy to metformin, remains on glipizide Hypothyroidism- TSH 2.18 07/02/2022, remains on levothyroxine Depression- remains on Zoloft, Na+ 137 08/31/2022 HTN- BUN/creat 16/0.7 08/31/2022, remains on  metoprolol Dysphagia- no recent aspirations, on chopped diet  No recent falls or injuries.   Recent blood pressures:  03/18- 126/78  03/11- 145/83  03/04- 144/76  Recent weights:  03/01- 149 lbs  02/05- 151.3 lbs  12/01- 153.1 lbs  Past Medical History:  Diagnosis Date   Burst fracture of lumbar vertebra, sequela 12/27/2019   Closed left hip fracture (HCC) 04/07/2020   Diabetes mellitus type 2 in obese 11/09/2017   12/13/19 Hgb a1c 6.8, Na 141, K 4.3, Bun 16, creat 0.78, eGFR 67, wbc 9.6, Hgb 12.6, plt 318, neutrophils 60.9%, LDL 51  01/10/20 wbc 14.3, Hgb 13.6, plt 523, neutrophils 70.5%, Na 142, K 3.3, Bun 27, creat 0.62, eGFR 80  01/31/20 eye exam when the patient is able.   01/08/21 Hgb a1c 5.5   Fall as cause of accidental injury in residential institution as place of occurrence 04/07/2020   Hearing loss    History of fracture of clavicle    Hypokalemia 01/21/2020   01/21/20 Na 141, K 4.1, Bun 12, creat 0.65, eGFR 79, wbc 9.2, Hgb 12.7, plt 331   Hypothyroidism    Insomnia    Right lower lobe pneumonia 01/14/2020   Tachycardia 04/03/2019   Tinnitus    Unspecified fracture of the lower end of left radius, initial encounter for closed fracture 04/07/2020   Past Surgical History:  Procedure Laterality Date   CHOLECYSTECTOMY  2007   Dr. Alan Mulder   CLAVICLE EXCISION  1986   INTRAMEDULLARY (IM) NAIL INTERTROCHANTERIC Left 04/09/2020   Procedure: LEFT INTERTROCHANTERIC INTRAMEDULLARY (IM) NAIL;  Surgeon: Tarry Kos, MD;  Location: [facility] OR;  Service: Orthopedics;  Laterality: Left;   MOHS SURGERY     past 10 years chest & legs   OPEN REDUCTION INTERNAL FIXATION (ORIF) DISTAL RADIAL FRACTURE Left 04/09/2020   Procedure: CLOSED REDUCTION LEFT DISTAL RADIUS FRACTURE;  Surgeon: Tarry Kos, MD;  Location: [facility] OR;  Service: Orthopedics;  Laterality: Left;    Allergies  Allergen Reactions   Metformin And Related Diarrhea    Outpatient Encounter Medications as of 05/20/2023   Medication Sig   acetaminophen (TYLENOL) 325 MG tablet Take 650 mg by mouth every 8 (eight) hours as needed.   DIMETHICONE, TOPICAL, (CERAVE BABY) 1 % LOTN Apply 1 Application topically daily. Apply to shoulders, back, and chest.   docusate sodium (COLACE) 100 MG capsule Take 100 mg by mouth daily.   Emollient (CERAVE) LOTN Apply 1 Application topically as directed. Apply to back and chest topically every shift every 2 days for dry skin.   Emollient (CERAVE) LOTN Apply 1 Application topically daily. Apply to back and chest.   glipiZIDE 2.5 MG TABS Take 2.5 mg by mouth every morning.   hydrocortisone cream 1 % Apply 1 Application topically as needed for itching.   hydrocortisone cream 1 % Apply 1 Application topically every 12 (twelve) hours as needed for itching.   ipratropium-albuterol (DUONEB) 0.5-2.5 (3) MG/3ML SOLN Take 3 mLs by nebulization every 6 (six) hours as needed.   levothyroxine (SYNTHROID) 75 MCG tablet TAKE 1 TABLET (75 MCG TOTAL) BY MOUTH DAILY BEFORE BREAKFAST.   metoprolol tartrate (LOPRESSOR) 25 MG tablet Take 12.5 mg by mouth 2 (two) times daily.   nystatin powder Apply 1 Application topically daily as needed.   ondansetron (ZOFRAN) 4 MG tablet Take 4 mg by mouth every 12 (twelve) hours as needed for nausea or vomiting.   OXYGEN 2lpm as needed   polyethylene glycol (MIRALAX / GLYCOLAX) 17 g packet Take 17 g by mouth daily as needed for moderate constipation.   QUEtiapine (SEROQUEL) 25 MG tablet Take 37.5 mg by mouth daily at 2 PM. Hold 09/13- 09/18 while on paxlovid   sertraline (ZOLOFT) 50 MG tablet Take 75 mg by mouth daily.   Sodium Fluoride (PREVIDENT 5000 BOOSTER PLUS) 1.1 % PSTE Place 1 Application onto teeth in the morning and at bedtime.   zinc oxide 20 % ointment Apply 1 application topically as needed for irritation.   [DISCONTINUED] divalproex (DEPAKOTE) 125 MG DR tablet Take 125 mg by mouth daily.   No facility-administered encounter medications on file as of  05/20/2023.    Review of Systems  Unable to perform ROS: Dementia    Immunization History  Administered Date(s) Administered   DTaP 07/20/2013   Fluad Quad(high Dose 65+) 12/23/2021, 12/29/2022   Influenza, High Dose Seasonal PF 12/07/2016   Influenza,inj,Quad PF,6+ Mos 12/01/2017   Influenza-Unspecified 11/20/2014, 12/12/2015, 11/20/2019   Moderna Covid-19 Fall Seasonal Vaccine 22yrs & older 01/05/2023   Moderna SARS-COV2 Booster Vaccination 08/06/2020   Moderna Sars-Covid-2 Vaccination 03/05/2019, 04/02/2019   PFIZER(Purple Top)SARS-COV-2 Vaccination 04/01/2020, 08/06/2020, 11/19/2020, 01/05/2022   PPD Test 08/06/2014   Pneumococcal Conjugate-13 11/07/2013   Pneumococcal Polysaccharide-23 03/22/2017   Tetanus 03/02/2015   Zoster Recombinant(Shingrix) 06/07/2017, 08/26/2017   Zoster, Live 03/01/2006   Pertinent  Health Maintenance Due  Topic Date Due   OPHTHALMOLOGY EXAM  11/19/2022   HEMOGLOBIN A1C  06/30/2023   FOOT EXAM  12/24/2023   INFLUENZA VACCINE  Completed   DEXA SCAN  Completed      05/19/2022   12:28 PM 09/10/2022  2:05 PM 10/25/2022    9:27 AM 12/24/2022    2:09 PM 04/20/2023    2:03 PM  Fall Risk  Falls in the past year? 1 0 0 1 0  Was there an injury with Fall? 0 0 0 0 0  Fall Risk Category Calculator 1 0 0 1 0  Patient at Risk for Falls Due to History of fall(s);Impaired balance/gait;Impaired mobility History of fall(s);Impaired balance/gait;Impaired mobility History of fall(s);Impaired balance/gait;Impaired mobility History of fall(s);Impaired balance/gait;Impaired mobility History of fall(s);Impaired balance/gait  Fall risk Follow up Falls evaluation completed;Education provided;Falls prevention discussed Falls evaluation completed;Education provided;Falls prevention discussed Falls evaluation completed;Education provided;Falls prevention discussed Falls evaluation completed;Education provided;Falls prevention discussed Falls evaluation completed;Education  provided   Functional Status Survey:    Vitals:   05/20/23 1545  BP: 126/78  Pulse: 77  Resp: 18  Temp: (!) 96.2 F (35.7 C)  SpO2: 97%  Weight: 149 lb (67.6 kg)  Height: 5' (1.524 m)   Body mass index is 29.1 kg/m. Physical Exam Vitals reviewed.  Constitutional:      General: She is not in acute distress. HENT:     Head: Normocephalic.  Eyes:     General:        Right eye: No discharge.        Left eye: No discharge.  Cardiovascular:     Rate and Rhythm: Normal rate and regular rhythm.     Pulses: Normal pulses.     Heart sounds: Normal heart sounds.  Pulmonary:     Effort: Pulmonary effort is normal.     Breath sounds: Normal breath sounds.  Abdominal:     General: Bowel sounds are normal. There is no distension.     Palpations: Abdomen is soft.     Tenderness: There is no abdominal tenderness.  Musculoskeletal:     Cervical back: Neck supple.     Right lower leg: No edema.     Left lower leg: No edema.  Skin:    General: Skin is warm.     Capillary Refill: Capillary refill takes less than 2 seconds.     Comments: Right thumb with nail yellow discoloration, irregular shape  Neurological:     General: No focal deficit present.     Mental Status: She is alert. Mental status is at baseline.     Motor: Weakness present.     Gait: Gait abnormal.  Psychiatric:        Mood and Affect: Mood normal.     Comments: Aphasia, alert to self/person, follows some commands     Labs reviewed: Recent Labs    08/31/22 0000  NA 137  K 3.9  CL 102  CO2 27*  BUN 16  CREATININE 0.7  CALCIUM 9.1   No results for input(s): "AST", "ALT", "ALKPHOS", "BILITOT", "PROT", "ALBUMIN" in the last 8760 hours. Recent Labs    08/31/22 0000 12/27/22 0000  WBC 9.6  --   NEUTROABS 7,085.00  --   HGB 12.6 12.3  HCT 39  --   PLT 262  --    Lab Results  Component Value Date   TSH 2.18 07/02/2022   Lab Results  Component Value Date   HGBA1C 8.8 12/31/2022   Lab Results   Component Value Date   CHOL 158 05/18/2022   HDL 37 05/18/2022   LDLCALC 93 05/18/2022   TRIG 187 (A) 05/18/2022   CHOLHDL 3.0 12/13/2019    Significant Diagnostic Results in last 30 days:  No results  found.  Assessment/Plan 1. Vascular dementia with behavior disturbance (HCC) (Primary) - enrolled in hospice 08/2022 - no agitation but will yell in afternoon - dependent with ADLs except feeding - off depakote - cont Seroquel - cont skilled nursing   2. Type 2 diabetes mellitus with hyperglycemia, without long-term current use of insulin (HCC) - [place] A1c 8.8 - sugars 150-200's - no hypoglycemias - weight down 4 lbs  - recommend rechecking A1c> rechecked A1c 7.8 05/23/2023  3. Acquired hypothyroidism - TSH stable 08/2022 - cont levothyroxine  4. Recurrent depression (HCC) - no mood changes  - Na+ stable - cont Zoloft  5. Primary hypertension - controlled with metoprolol  6. Dysphagia, unspecified type - no recent aspirations - cont chopped diet    Family/ staff Communication: plan discussed with patient, daughter and nurse  Labs/tests ordered:  cbc/diff, bmp, A1c and TSH

## 2023-05-24 LAB — BASIC METABOLIC PANEL WITH GFR
BUN: 12 (ref 4–21)
CO2: 25 — AB (ref 13–22)
Chloride: 105 (ref 99–108)
Creatinine: 0.7 (ref 0.5–1.1)
Glucose: 159
Potassium: 3.9 meq/L (ref 3.5–5.1)
Sodium: 138 (ref 137–147)

## 2023-05-24 LAB — CBC AND DIFFERENTIAL
HCT: 41 (ref 36–46)
Hemoglobin: 12.7 (ref 12.0–16.0)
Neutrophils Absolute: 6109
Platelets: 248 10*3/uL (ref 150–400)
WBC: 9.5

## 2023-05-24 LAB — COMPREHENSIVE METABOLIC PANEL WITH GFR
Calcium: 8.9 (ref 8.7–10.7)
eGFR: 78

## 2023-05-24 LAB — TSH: TSH: 2.52 (ref 0.41–5.90)

## 2023-05-24 LAB — CBC: RBC: 4.98 (ref 3.87–5.11)

## 2023-05-24 LAB — HEMOGLOBIN A1C: Hemoglobin A1C: 7.8

## 2023-05-27 ENCOUNTER — Non-Acute Institutional Stay (SKILLED_NURSING_FACILITY): Payer: Self-pay | Admitting: Orthopedic Surgery

## 2023-05-27 ENCOUNTER — Encounter: Payer: Self-pay | Admitting: Orthopedic Surgery

## 2023-05-27 DIAGNOSIS — H1032 Unspecified acute conjunctivitis, left eye: Secondary | ICD-10-CM

## 2023-05-27 DIAGNOSIS — J069 Acute upper respiratory infection, unspecified: Secondary | ICD-10-CM | POA: Diagnosis not present

## 2023-05-27 MED ORDER — POLYMYXIN B-TRIMETHOPRIM 10000-0.1 UNIT/ML-% OP SOLN: 2.0000 [drp] | Freq: Three times a day (TID) | OPHTHALMIC | Status: AC

## 2023-05-27 MED ORDER — AZITHROMYCIN 250 MG PO TABS
ORAL_TABLET | ORAL | Status: DC
Start: 2023-05-27 — End: 2023-05-29

## 2023-05-27 MED ORDER — AZITHROMYCIN 250 MG PO TABS
ORAL_TABLET | ORAL | Status: DC
Start: 2023-05-27 — End: 2023-05-27

## 2023-05-27 MED ORDER — DOXYCYCLINE HYCLATE 100 MG PO TABS: 100.0000 mg | ORAL_TABLET | Freq: Two times a day (BID) | ORAL | Status: AC

## 2023-05-27 NOTE — Progress Notes (Signed)
 Location:  [facility] Room Number: 39/A Place of Service:  SNF [phone]) Provider:  Octavia Heir, NP   Mahlon Gammon, MD  Patient Care Team: Mahlon Gammon, MD as PCP - General (Internal Medicine)  Extended Emergency Contact Information Primary Emergency Contact: Harris,Margaret Address: [address]          [place], [place] [address] [place] Home Phone: [phone] Mobile Phone: [phone] Relation: Daughter  Code Status:  DNR Goals of care: Advanced Directive information    04/20/2023    9:59 AM  Advanced Directives  Does Patient Have a Medical Advance Directive? Yes  Type of Estate agent of [place];Living will;Out of facility DNR (pink MOST or yellow form)  Does patient want to make changes to medical advance directive? No - Patient declined  Copy of Healthcare Power of Attorney in Chart? Yes - validated most recent copy scanned in chart (See row information)     Chief Complaint  Patient presents with   Acute Visit    Eye redness    HPI:  Pt is a 89 y.o. female seen today for acute visit due to eye redness.   She currently resides on the skilled nursing unit at [facility]. Past medical includes: HTN, constipation, T2DM, hypothyroidism, OA, urinary retention, vascular dementia, depression and anxiety.    Followed by hospice.   Poor historian due to dementia. Nursing reports increased somnolence yesterday. Covid tests was negative. This morning her eye are red and she remains tired. Repeat covid test negative. She has increased productive cough during encounter. Afebrile. Vitals stable.    Past Medical History:  Diagnosis Date   Burst fracture of lumbar vertebra, sequela 12/27/2019   Closed left hip fracture (HCC) 04/07/2020   Diabetes mellitus type 2 in obese 11/09/2017   12/13/19 Hgb a1c 6.8, Na 141, K 4.3, Bun 16, creat 0.78, eGFR 67, wbc 9.6, Hgb 12.6, plt 318, neutrophils 60.9%, LDL 51  01/10/20  wbc 14.3, Hgb 13.6, plt 523, neutrophils 70.5%, Na 142, K 3.3, Bun 27, creat 0.62, eGFR 80  01/31/20 eye exam when the patient is able.   01/08/21 Hgb a1c 5.5   Fall as cause of accidental injury in residential institution as place of occurrence 04/07/2020   Hearing loss    History of fracture of clavicle    Hypokalemia 01/21/2020   01/21/20 Na 141, K 4.1, Bun 12, creat 0.65, eGFR 79, wbc 9.2, Hgb 12.7, plt 331   Hypothyroidism    Insomnia    Right lower lobe pneumonia 01/14/2020   Tachycardia 04/03/2019   Tinnitus    Unspecified fracture of the lower end of left radius, initial encounter for closed fracture 04/07/2020   Past Surgical History:  Procedure Laterality Date   CHOLECYSTECTOMY  2007   Dr. Alan Mulder   CLAVICLE EXCISION  1986   INTRAMEDULLARY (IM) NAIL INTERTROCHANTERIC Left 04/09/2020   Procedure: LEFT INTERTROCHANTERIC INTRAMEDULLARY (IM) NAIL;  Surgeon: Tarry Kos, MD;  Location: [facility] OR;  Service: Orthopedics;  Laterality: Left;   MOHS SURGERY     past 10 years chest & legs   OPEN REDUCTION INTERNAL FIXATION (ORIF) DISTAL RADIAL FRACTURE Left 04/09/2020   Procedure: CLOSED REDUCTION LEFT DISTAL RADIUS FRACTURE;  Surgeon: Tarry Kos, MD;  Location: [facility] OR;  Service: Orthopedics;  Laterality: Left;    Allergies  Allergen Reactions   Metformin And Related Diarrhea    Outpatient Encounter Medications as of 05/27/2023  Medication Sig  acetaminophen (TYLENOL) 325 MG tablet Take 650 mg by mouth every 8 (eight) hours as needed.   DIMETHICONE, TOPICAL, (CERAVE BABY) 1 % LOTN Apply 1 Application topically daily. Apply to shoulders, back, and chest.   docusate sodium (COLACE) 100 MG capsule Take 100 mg by mouth daily.   Emollient (CERAVE) LOTN Apply 1 Application topically as directed. Apply to back and chest topically every shift every 2 days for dry skin.   Emollient (CERAVE) LOTN Apply 1 Application topically daily. Apply to back and chest.   glipiZIDE 2.5 MG TABS Take 2.5 mg by  mouth every morning.   hydrocortisone cream 1 % Apply 1 Application topically as needed for itching.   hydrocortisone cream 1 % Apply 1 Application topically every 12 (twelve) hours as needed for itching.   ipratropium-albuterol (DUONEB) 0.5-2.5 (3) MG/3ML SOLN Take 3 mLs by nebulization every 6 (six) hours as needed.   levothyroxine (SYNTHROID) 75 MCG tablet TAKE 1 TABLET (75 MCG TOTAL) BY MOUTH DAILY BEFORE BREAKFAST.   metoprolol tartrate (LOPRESSOR) 25 MG tablet Take 12.5 mg by mouth 2 (two) times daily.   nystatin powder Apply 1 Application topically daily as needed.   ondansetron (ZOFRAN) 4 MG tablet Take 4 mg by mouth every 12 (twelve) hours as needed for nausea or vomiting.   OXYGEN 2lpm as needed   polyethylene glycol (MIRALAX / GLYCOLAX) 17 g packet Take 17 g by mouth daily as needed for moderate constipation.   QUEtiapine (SEROQUEL) 25 MG tablet Take 37.5 mg by mouth daily at 2 PM. Hold 09/13- 09/18 while on paxlovid   sertraline (ZOLOFT) 50 MG tablet Take 75 mg by mouth daily.   Sodium Fluoride (PREVIDENT 5000 BOOSTER PLUS) 1.1 % PSTE Place 1 Application onto teeth in the morning and at bedtime.   zinc oxide 20 % ointment Apply 1 application topically as needed for irritation.   [DISCONTINUED] divalproex (DEPAKOTE) 125 MG DR tablet Take 125 mg by mouth daily.   No facility-administered encounter medications on file as of 05/27/2023.    Review of Systems  Unable to perform ROS: Dementia    Immunization History  Administered Date(s) Administered   DTaP 07/20/2013   Fluad Quad(high Dose 65+) 12/23/2021, 12/29/2022   Influenza, High Dose Seasonal PF 12/07/2016   Influenza,inj,Quad PF,6+ Mos 12/01/2017   Influenza-Unspecified 11/20/2014, 12/12/2015, 11/20/2019   Moderna Covid-19 Fall Seasonal Vaccine 15yrs & older 01/05/2023   Moderna SARS-COV2 Booster Vaccination 08/06/2020   Moderna Sars-Covid-2 Vaccination 03/05/2019, 04/02/2019   PFIZER(Purple Top)SARS-COV-2 Vaccination  04/01/2020, 08/06/2020, 11/19/2020, 01/05/2022   PPD Test 08/06/2014   Pneumococcal Conjugate-13 11/07/2013   Pneumococcal Polysaccharide-23 03/22/2017   Tetanus 03/02/2015   Zoster Recombinant(Shingrix) 06/07/2017, 08/26/2017   Zoster, Live 03/01/2006   Pertinent  Health Maintenance Due  Topic Date Due   OPHTHALMOLOGY EXAM  11/19/2022   HEMOGLOBIN A1C  06/30/2023   FOOT EXAM  12/24/2023   INFLUENZA VACCINE  Completed   DEXA SCAN  Completed      05/19/2022   12:28 PM 09/10/2022    2:05 PM 10/25/2022    9:27 AM 12/24/2022    2:09 PM 04/20/2023    2:03 PM  Fall Risk  Falls in the past year? 1 0 0 1 0  Was there an injury with Fall? 0 0 0 0 0  Fall Risk Category Calculator 1 0 0 1 0  Patient at Risk for Falls Due to History of fall(s);Impaired balance/gait;Impaired mobility History of fall(s);Impaired balance/gait;Impaired mobility History of fall(s);Impaired balance/gait;Impaired mobility  History of fall(s);Impaired balance/gait;Impaired mobility History of fall(s);Impaired balance/gait  Fall risk Follow up Falls evaluation completed;Education provided;Falls prevention discussed Falls evaluation completed;Education provided;Falls prevention discussed Falls evaluation completed;Education provided;Falls prevention discussed Falls evaluation completed;Education provided;Falls prevention discussed Falls evaluation completed;Education provided   Functional Status Survey:    Vitals:   05/27/23 1152  BP: (!) 155/87  Pulse: 90  Resp: 17  Temp: 97.6 F (36.4 C)  SpO2: 94%  Weight: 149 lb (67.6 kg)  Height: 5' (1.524 m)   Body mass index is 29.1 kg/m. Physical Exam Constitutional:      General: She is not in acute distress. HENT:     Head: Normocephalic.     Right Ear: Tympanic membrane normal.     Left Ear: Tympanic membrane normal.     Nose: Congestion present.     Right Turbinates: Enlarged.     Left Turbinates: Enlarged.     Mouth/Throat:     Pharynx: No posterior  oropharyngeal erythema.  Eyes:     General:        Right eye: No discharge.        Left eye: Discharge present. Cardiovascular:     Rate and Rhythm: Normal rate and regular rhythm.     Pulses: Normal pulses.     Heart sounds: Normal heart sounds.  Pulmonary:     Effort: Pulmonary effort is normal. No respiratory distress.     Breath sounds: Rhonchi present. No wheezing or rales.  Abdominal:     General: Bowel sounds are normal.     Palpations: Abdomen is soft.  Musculoskeletal:        General: Normal range of motion.     Cervical back: Neck supple.  Lymphadenopathy:     Cervical: No cervical adenopathy.  Skin:    General: Skin is warm.     Capillary Refill: Capillary refill takes less than 2 seconds.  Neurological:     General: No focal deficit present.     Mental Status: She is alert. Mental status is at baseline.     Motor: Weakness present.     Gait: Gait abnormal.  Psychiatric:        Mood and Affect: Mood normal.     Labs reviewed: Recent Labs    08/31/22 0000  NA 137  K 3.9  CL 102  CO2 27*  BUN 16  CREATININE 0.7  CALCIUM 9.1   No results for input(s): "AST", "ALT", "ALKPHOS", "BILITOT", "PROT", "ALBUMIN" in the last 8760 hours. Recent Labs    08/31/22 0000 12/27/22 0000  WBC 9.6  --   NEUTROABS 7,085.00  --   HGB 12.6 12.3  HCT 39  --   PLT 262  --    Lab Results  Component Value Date   TSH 2.18 07/02/2022   Lab Results  Component Value Date   HGBA1C 8.8 12/31/2022   Lab Results  Component Value Date   CHOL 158 05/18/2022   HDL 37 05/18/2022   LDLCALC 93 05/18/2022   TRIG 187 (A) 05/18/2022   CHOLHDL 3.0 12/13/2019    Significant Diagnostic Results in last 30 days:  No results found.  Assessment/Plan 1. Upper respiratory tract infection, unspecified type (Primary) - increased somnolence x 1 day, covid negative  - rhonchi and productive cough on exam - interaction with Seroquel and Zpack, will start doxy - doxycycline  (VIBRA-TABS) 100 MG tablet; Take 1 tablet (100 mg total) by mouth 2 (two) times daily for 7 days.  2.  Acute bacterial conjunctivitis of left eye - trimethoprim-polymyxin b (POLYTRIM) ophthalmic solution; Place 2 drops into the left eye 3 (three) times daily for 5 days.    Family/ staff Communication: plan discussed with hospice nurse   Labs/tests ordered:  none

## 2023-06-16 ENCOUNTER — Non-Acute Institutional Stay (SKILLED_NURSING_FACILITY): Payer: Self-pay | Admitting: Internal Medicine

## 2023-06-16 DIAGNOSIS — R000 Tachycardia, unspecified: Secondary | ICD-10-CM | POA: Diagnosis not present

## 2023-06-16 DIAGNOSIS — F339 Major depressive disorder, recurrent, unspecified: Secondary | ICD-10-CM | POA: Diagnosis not present

## 2023-06-16 DIAGNOSIS — E1165 Type 2 diabetes mellitus with hyperglycemia: Secondary | ICD-10-CM

## 2023-06-16 DIAGNOSIS — E039 Hypothyroidism, unspecified: Secondary | ICD-10-CM | POA: Diagnosis not present

## 2023-06-16 DIAGNOSIS — F03918 Unspecified dementia, unspecified severity, with other behavioral disturbance: Secondary | ICD-10-CM

## 2023-06-17 ENCOUNTER — Encounter: Payer: Self-pay | Admitting: Internal Medicine

## 2023-06-17 NOTE — Progress Notes (Signed)
 Location:  [facility]:  SNF [phone])  Provider:   Code Status: DNR/Hospice Goals of Care:     04/20/2023    9:59 AM  Advanced Directives  Does Patient Have a Medical Advance Directive? Yes  Type of Estate agent of [place];Living will;Out of facility DNR (pink MOST or yellow form)  Does patient want to make changes to medical advance directive? No - Patient declined  Copy of Healthcare Power of Attorney in Chart? Yes - validated most recent copy scanned in chart (See row information)     Chief Complaint  Patient presents with   Care Management    HPI: [name] a 89 y.o. female seen today for medical management of chronic diseases.    [name] Resident of SNF    Patient has a history of type 2 diabetes, hyperlipidemia, depression, dementia, hypothyroidism, osteopenia, stress and urge urinary incontinence, Tachycardia  h/o Hip fracture and Compression fracture after falls    Enrolled in Hospice    She is stable. No new Nursing issues. No Behavior issues She has lost some weight since my last visit but overall stable Hoyer and Wheelchair Dependent No Falls  Wt Readings from Last 3 Encounters:  06/17/23 149 lb (67.6 kg)  05/27/23 149 lb (67.6 kg)  05/20/23 149 lb (67.6 kg)    Past Medical History:  Diagnosis Date   Burst fracture of lumbar vertebra, sequela 12/27/2019   Closed left hip fracture (HCC) 04/07/2020   Diabetes mellitus type 2 in obese 11/09/2017   12/13/19 Hgb a1c 6.8, Na 141, K 4.3, Bun 16, creat 0.78, eGFR 67, wbc 9.6, Hgb 12.6, plt 318, neutrophils 60.9%, LDL 51  01/10/20 wbc 14.3, Hgb 13.6, plt 523, neutrophils 70.5%, Na 142, K 3.3, Bun 27, creat 0.62, eGFR 80  01/31/20 eye exam when the patient is able.   01/08/21 Hgb a1c 5.5   Fall as cause of accidental injury in residential institution as place of occurrence 04/07/2020   Hearing loss    History of fracture of clavicle    Hypokalemia 01/21/2020    01/21/20 Na 141, K 4.1, Bun 12, creat 0.65, eGFR 79, wbc 9.2, Hgb 12.7, plt 331   Hypothyroidism    Insomnia    Right lower lobe pneumonia 01/14/2020   Tachycardia 04/03/2019   Tinnitus    Unspecified fracture of the lower end of left radius, initial encounter for closed fracture 04/07/2020    Past Surgical History:  Procedure Laterality Date   CHOLECYSTECTOMY  2007   Dr. Wendelyn Halter   CLAVICLE EXCISION  1986   INTRAMEDULLARY (IM) NAIL INTERTROCHANTERIC Left 04/09/2020   Procedure: LEFT INTERTROCHANTERIC INTRAMEDULLARY (IM) NAIL;  Surgeon: Wes Hamman, MD;  Location: [facility] OR;  Service: Orthopedics;  Laterality: Left;   MOHS SURGERY     past 10 years chest & legs   OPEN REDUCTION INTERNAL FIXATION (ORIF) DISTAL RADIAL FRACTURE Left 04/09/2020   Procedure: CLOSED REDUCTION LEFT DISTAL RADIUS FRACTURE;  Surgeon: Wes Hamman, MD;  Location: [facility] OR;  Service: Orthopedics;  Laterality: Left;    Allergies  Allergen Reactions   Metformin  And Related Diarrhea    Outpatient Encounter Medications as of 06/16/2023  Medication Sig   acetaminophen  (TYLENOL ) 325 MG tablet Take 650 mg by mouth every 8 (eight) hours as needed.   DIMETHICONE, TOPICAL, (CERAVE BABY) 1 % LOTN Apply 1 Application topically daily. Apply to shoulders, back, and chest.   docusate sodium  (COLACE) 100 MG  capsule Take 100 mg by mouth daily.   Emollient (CERAVE) LOTN Apply 1 Application topically as directed. Apply to back and chest topically every shift every 2 days for dry skin.   Emollient (CERAVE) LOTN Apply 1 Application topically daily. Apply to back and chest.   glipiZIDE  2.5 MG TABS Take 2.5 mg by mouth every morning.   hydrocortisone  cream 1 % Apply 1 Application topically as needed for itching.   hydrocortisone  cream 1 % Apply 1 Application topically every 12 (twelve) hours as needed for itching.   ipratropium-albuterol  (DUONEB) 0.5-2.5 (3) MG/3ML SOLN Take 3 mLs by nebulization every 6 (six) hours as needed.    levothyroxine  (SYNTHROID ) 75 MCG tablet TAKE 1 TABLET (75 MCG TOTAL) BY MOUTH DAILY BEFORE BREAKFAST.   metoprolol  tartrate (LOPRESSOR ) 25 MG tablet Take 12.5 mg by mouth 2 (two) times daily.   nystatin  powder Apply 1 Application topically daily as needed.   ondansetron  (ZOFRAN ) 4 MG tablet Take 4 mg by mouth every 12 (twelve) hours as needed for nausea or vomiting.   OXYGEN 2lpm as needed   polyethylene glycol (MIRALAX  / GLYCOLAX ) 17 g packet Take 17 g by mouth daily as needed for moderate constipation.   QUEtiapine  (SEROQUEL ) 25 MG tablet Take 37.5 mg by mouth daily at 2 PM. Hold 09/13- 09/18 while on paxlovid    sertraline  (ZOLOFT ) 50 MG tablet Take 75 mg by mouth daily.   Sodium Fluoride (PREVIDENT 5000 BOOSTER PLUS) 1.1 % PSTE Place 1 Application onto teeth in the morning and at bedtime.   zinc oxide 20 % ointment Apply 1 application topically as needed for irritation.   [DISCONTINUED] divalproex  (DEPAKOTE ) 125 MG DR tablet Take 125 mg by mouth daily.   No facility-administered encounter medications on file as of 06/16/2023.    Review of Systems:  Review of Systems  Unable to perform ROS: Dementia    Health Maintenance  Topic Date Due   OPHTHALMOLOGY EXAM  11/19/2022   HEMOGLOBIN A1C  06/30/2023   COVID-19 Vaccine (8 - Mixed Product risk 2024-25 season) 07/05/2023   INFLUENZA VACCINE  09/30/2023   FOOT EXAM  12/24/2023   DTaP/Tdap/Td (3 - Tdap) 03/01/2025   Pneumonia Vaccine 43+ Years old  Completed   DEXA SCAN  Completed   Zoster Vaccines- Shingrix  Completed   HPV VACCINES  Aged Out   Meningococcal B Vaccine  Aged Out    Physical Exam: There were no vitals filed for this visit. There is no height or weight on file to calculate BMI. Physical Exam Vitals reviewed.  Constitutional:      Appearance: Normal appearance.  HENT:     Head: Normocephalic.     Nose: Nose normal.     Mouth/Throat:     Mouth: Mucous membranes are moist.     Pharynx: Oropharynx is clear.  Eyes:      Pupils: Pupils are equal, round, and reactive to light.  Cardiovascular:     Rate and Rhythm: Normal rate and regular rhythm.     Pulses: Normal pulses.     Heart sounds: Normal heart sounds. No murmur heard. Pulmonary:     Effort: Pulmonary effort is normal.     Breath sounds: Normal breath sounds.  Abdominal:     General: Abdomen is flat. Bowel sounds are normal.     Palpations: Abdomen is soft.  Musculoskeletal:        General: No swelling.     Cervical back: Neck supple.  Skin:  General: Skin is warm.  Neurological:     Mental Status: She is alert.     Comments: Smiles and speaks few words  Can follow some commands  Psychiatric:        Mood and Affect: Mood normal.        Thought Content: Thought content normal.     Labs reviewed: Basic Metabolic Panel: Recent Labs    07/02/22 0000 08/31/22 0000  NA  --  137  K  --  3.9  CL  --  102  CO2  --  27*  BUN  --  16  CREATININE  --  0.7  CALCIUM   --  9.1  TSH 2.18  --    Liver Function Tests: No results for input(s): "AST", "ALT", "ALKPHOS", "BILITOT", "PROT", "ALBUMIN" in the last 8760 hours. No results for input(s): "LIPASE", "AMYLASE" in the last 8760 hours. No results for input(s): "AMMONIA" in the last 8760 hours. CBC: Recent Labs    08/31/22 0000 12/27/22 0000  WBC 9.6  --   NEUTROABS 7,085.00  --   HGB 12.6 12.3  HCT 39  --   PLT 262  --    Lipid Panel: No results for input(s): "CHOL", "HDL", "LDLCALC", "TRIG", "CHOLHDL", "LDLDIRECT" in the last 8760 hours. Lab Results  Component Value Date   HGBA1C 8.8 12/31/2022    Procedures since last visit: No results found.  Assessment/Plan 1. Type 2 diabetes mellitus with hyperglycemia, without long-term current use of insulin  (HCC) (Primary) Is on Low dose of Glipizide  CBGS in 160-200 A1C 7.8 in 3/25 2. Acquired hypothyroidism TSH normal in 3/25  3. Recurrent depression (HCC) Zoloft   4. Tachycardia Low Dose of Lopressor   5. Dementia  with behavioral disturbance (HCC) Doing well with Seroquel  No GDR Full care In Hospice 6. Dysphagia, unspecified type Chopped Needs help with Feeding some   Labs/tests ordered:  * No order type specified * Next appt:  Visit date not found

## 2023-07-13 ENCOUNTER — Non-Acute Institutional Stay (SKILLED_NURSING_FACILITY): Payer: Self-pay | Admitting: Orthopedic Surgery

## 2023-07-13 ENCOUNTER — Encounter: Payer: Self-pay | Admitting: Orthopedic Surgery

## 2023-07-13 DIAGNOSIS — E1165 Type 2 diabetes mellitus with hyperglycemia: Secondary | ICD-10-CM

## 2023-07-13 DIAGNOSIS — F339 Major depressive disorder, recurrent, unspecified: Secondary | ICD-10-CM

## 2023-07-13 DIAGNOSIS — E039 Hypothyroidism, unspecified: Secondary | ICD-10-CM

## 2023-07-13 DIAGNOSIS — I10 Essential (primary) hypertension: Secondary | ICD-10-CM | POA: Diagnosis not present

## 2023-07-13 DIAGNOSIS — F01518 Vascular dementia, unspecified severity, with other behavioral disturbance: Secondary | ICD-10-CM

## 2023-07-13 DIAGNOSIS — R1310 Dysphagia, unspecified: Secondary | ICD-10-CM

## 2023-07-13 NOTE — Progress Notes (Signed)
 Location:      Place of Service:    Provider:  Arnetha Bhat, NP  Patient Care Team: Marguerite Shiley, MD as PCP - General (Internal Medicine)  Extended Emergency Contact Information Primary Emergency Contact: Harris,Margaret Address: [address]          [place], [place] [address] [place] Home Phone: [phone] Mobile Phone: [phone] Relation: Daughter  Code Status:  DNR Goals of care: Advanced Directive information    07/13/2023   11:12 AM  Advanced Directives  Does Patient Have a Medical Advance Directive? Yes  Type of Estate agent of [place];Living will;Out of facility DNR (pink MOST or yellow form)  Does patient want to make changes to medical advance directive? No - Patient declined  Copy of Healthcare Power of Attorney in Chart? Yes - validated most recent copy scanned in chart (See row information)  Pre-existing out of facility DNR order (yellow form or pink MOST form) Pink MOST/Yellow Form most recent copy in chart - Physician notified to receive inpatient order     Chief Complaint  Patient presents with   Medical Management of Chronic Issues    routine    HPI:  Pt is a 89 y.o. female seen today for medical management of chronic diseases.    She currently resides on the skilled nursing unit at [facility]. Past medical includes: HTN, constipation, T2DM, hypothyroidism, OA, urinary retention, vascular dementia, depression and anxiety.     HTN- BUN/creat 12/.072 05/23/2023, remains on metoprolol  Dementia- followed by hospice> enrolled 08/2022, unable to complete BIMS/ MMSE, CT head 04/2020 age related atrophy and chronic small vessel ischemia, sometimes yells in afternoon>no agitation, dependent with ADLs except feeding, remains on Seroquel  T2DM- A1c 7.8 (03/24)> was 8.8 (10/31), recent sugars 160-190's, no hypoglycemic events, allergy to metformin , remains on glipizide  Hypothyroidism- TSH 2.52 05/23/2023, remains on  levothyroxine  Depression- Na+ 138 05/23/2023, remains on Zoloft  Dysphagia- no recent aspirations, on chopped diet  Recent blood pressures:  05/13- 142/77  05/06- 115/72  04/29- 133/84 Past Medical History:  Diagnosis Date   Burst fracture of lumbar vertebra, sequela 12/27/2019   Closed left hip fracture (HCC) 04/07/2020   Diabetes mellitus type 2 in obese 11/09/2017   12/13/19 Hgb a1c 6.8, Na 141, K 4.3, Bun 16, creat 0.78, eGFR 67, wbc 9.6, Hgb 12.6, plt 318, neutrophils 60.9%, LDL 51  01/10/20 wbc 14.3, Hgb 13.6, plt 523, neutrophils 70.5%, Na 142, K 3.3, Bun 27, creat 0.62, eGFR 80  01/31/20 eye exam when the patient is able.   01/08/21 Hgb a1c 5.5   Fall as cause of accidental injury in residential institution as place of occurrence 04/07/2020   Hearing loss    History of fracture of clavicle    Hypokalemia 01/21/2020   01/21/20 Na 141, K 4.1, Bun 12, creat 0.65, eGFR 79, wbc 9.2, Hgb 12.7, plt 331   Hypothyroidism    Insomnia    Right lower lobe pneumonia 01/14/2020   Tachycardia 04/03/2019   Tinnitus    Unspecified fracture of the lower end of left radius, initial encounter for closed fracture 04/07/2020   Past Surgical History:  Procedure Laterality Date   CHOLECYSTECTOMY  2007   Dr. Wendelyn Halter   CLAVICLE EXCISION  1986   INTRAMEDULLARY (IM) NAIL INTERTROCHANTERIC Left 04/09/2020   Procedure: LEFT INTERTROCHANTERIC INTRAMEDULLARY (IM) NAIL;  Surgeon: Wes Hamman, MD;  Location: [facility] OR;  Service: Orthopedics;  Laterality: Left;   MOHS SURGERY  past 10 years chest & legs   OPEN REDUCTION INTERNAL FIXATION (ORIF) DISTAL RADIAL FRACTURE Left 04/09/2020   Procedure: CLOSED REDUCTION LEFT DISTAL RADIUS FRACTURE;  Surgeon: Wes Hamman, MD;  Location: [facility] OR;  Service: Orthopedics;  Laterality: Left;    Allergies  Allergen Reactions   Metformin  And Related Diarrhea    Outpatient Encounter Medications as of 07/13/2023  Medication Sig   acetaminophen  (TYLENOL ) 325 MG tablet Take  650 mg by mouth every 8 (eight) hours as needed.   DIMETHICONE, TOPICAL, (CERAVE BABY) 1 % LOTN Apply 1 Application topically daily. Apply to shoulders, back, and chest.   docusate sodium  (COLACE) 100 MG capsule Take 100 mg by mouth daily.   Emollient (CERAVE) LOTN Apply 1 Application topically as directed. Apply to back and chest topically every shift every 2 days for dry skin.   glipiZIDE  2.5 MG TABS Take 2.5 mg by mouth every morning.   hydrocortisone  cream 1 % Apply 1 Application topically as needed for itching.   hydrocortisone  cream 1 % Apply 1 Application topically every 12 (twelve) hours as needed for itching.   ipratropium-albuterol  (DUONEB) 0.5-2.5 (3) MG/3ML SOLN Take 3 mLs by nebulization every 6 (six) hours as needed.   levothyroxine  (SYNTHROID ) 75 MCG tablet TAKE 1 TABLET (75 MCG TOTAL) BY MOUTH DAILY BEFORE BREAKFAST.   metoprolol  tartrate (LOPRESSOR ) 25 MG tablet Take 12.5 mg by mouth 2 (two) times daily.   nystatin  powder Apply 1 Application topically daily as needed.   ondansetron  (ZOFRAN ) 4 MG tablet Take 4 mg by mouth every 12 (twelve) hours as needed for nausea or vomiting.   OXYGEN 2lpm as needed   polyethylene glycol (MIRALAX  / GLYCOLAX ) 17 g packet Take 17 g by mouth daily as needed for moderate constipation.   QUEtiapine  (SEROQUEL ) 25 MG tablet Take 37.5 mg by mouth daily at 2 PM. Hold 09/13- 09/18 while on paxlovid    sertraline  (ZOLOFT ) 50 MG tablet Take 75 mg by mouth daily.   Sodium Fluoride (PREVIDENT 5000 BOOSTER PLUS) 1.1 % PSTE Place 1 Application onto teeth in the morning and at bedtime.   zinc oxide 20 % ointment Apply 1 application topically as needed for irritation.   Emollient (CERAVE) LOTN Apply 1 Application topically daily. Apply to back and chest. (Patient not taking: Reported on 07/13/2023)   [DISCONTINUED] divalproex  (DEPAKOTE ) 125 MG DR tablet Take 125 mg by mouth daily.   No facility-administered encounter medications on file as of 07/13/2023.     Review of Systems  Unable to perform ROS: Dementia    Immunization History  Administered Date(s) Administered   DTaP 07/20/2013   Fluad Quad(high Dose 65+) 12/23/2021, 12/29/2022   Influenza, High Dose Seasonal PF 12/07/2016   Influenza,inj,Quad PF,6+ Mos 12/01/2017   Influenza-Unspecified 11/20/2014, 12/12/2015, 11/20/2019   Moderna Covid-19 Fall Seasonal Vaccine 50yrs & older 01/05/2023   Moderna SARS-COV2 Booster Vaccination 08/06/2020   Moderna Sars-Covid-2 Vaccination 03/05/2019, 04/02/2019   PFIZER(Purple Top)SARS-COV-2 Vaccination 04/01/2020, 08/06/2020, 11/19/2020, 01/05/2022   PPD Test 08/06/2014   Pneumococcal Conjugate-13 11/07/2013   Pneumococcal Polysaccharide-23 03/22/2017   Tetanus 03/02/2015   Zoster Recombinant(Shingrix) 06/07/2017, 08/26/2017   Zoster, Live 03/01/2006   Pertinent  Health Maintenance Due  Topic Date Due   OPHTHALMOLOGY EXAM  11/19/2022   INFLUENZA VACCINE  09/30/2023   HEMOGLOBIN A1C  11/24/2023   FOOT EXAM  12/24/2023   DEXA SCAN  Completed      05/19/2022   12:28 PM 09/10/2022    2:05 PM 10/25/2022  9:27 AM 12/24/2022    2:09 PM 04/20/2023    2:03 PM  Fall Risk  Falls in the past year? 1 0 0 1 0  Was there an injury with Fall? 0 0 0 0 0  Fall Risk Category Calculator 1 0 0 1 0  Patient at Risk for Falls Due to History of fall(s);Impaired balance/gait;Impaired mobility History of fall(s);Impaired balance/gait;Impaired mobility History of fall(s);Impaired balance/gait;Impaired mobility History of fall(s);Impaired balance/gait;Impaired mobility History of fall(s);Impaired balance/gait  Fall risk Follow up Falls evaluation completed;Education provided;Falls prevention discussed Falls evaluation completed;Education provided;Falls prevention discussed Falls evaluation completed;Education provided;Falls prevention discussed Falls evaluation completed;Education provided;Falls prevention discussed Falls evaluation completed;Education provided    Functional Status Survey:    Vitals:   07/13/23 1113 07/13/23 1114  BP: (!) 142/77 115/72  Pulse: 91   Resp: 18   Temp: (!) 97.4 F (36.3 C)   SpO2: 96%   Weight: 149 lb (67.6 kg)   Height: 5' (1.524 m)    Body mass index is 29.1 kg/m. Physical Exam Vitals reviewed.  Constitutional:      General: She is not in acute distress. HENT:     Head: Normocephalic.     Right Ear: There is no impacted cerumen.     Left Ear: There is no impacted cerumen.     Nose: Nose normal.     Mouth/Throat:     Mouth: Mucous membranes are moist.  Eyes:     General:        Right eye: No discharge.        Left eye: No discharge.  Cardiovascular:     Rate and Rhythm: Normal rate and regular rhythm.     Pulses: Normal pulses.     Heart sounds: Normal heart sounds.  Pulmonary:     Effort: Pulmonary effort is normal. No respiratory distress.     Breath sounds: Normal breath sounds. No wheezing or rales.  Abdominal:     General: Bowel sounds are normal. There is no distension.     Palpations: Abdomen is soft.     Tenderness: There is no abdominal tenderness.  Musculoskeletal:     Cervical back: Neck supple.     Right lower leg: No edema.     Left lower leg: No edema.  Skin:    General: Skin is warm.     Capillary Refill: Capillary refill takes less than 2 seconds.  Neurological:     General: No focal deficit present.     Mental Status: She is alert. Mental status is at baseline.     Motor: Weakness present.     Gait: Gait abnormal.  Psychiatric:        Mood and Affect: Mood normal.     Labs reviewed: Recent Labs    08/31/22 0000 05/24/23 0000  NA 137 138  K 3.9 3.9  CL 102 105  CO2 27* 25*  BUN 16 12  CREATININE 0.7 0.7  CALCIUM  9.1 8.9   No results for input(s): "AST", "ALT", "ALKPHOS", "BILITOT", "PROT", "ALBUMIN" in the last 8760 hours. Recent Labs    08/31/22 0000 12/27/22 0000 05/24/23 0000  WBC 9.6  --  9.5  NEUTROABS 7,085.00  --  6,109.00  HGB 12.6 12.3  12.7  HCT 39  --  41  PLT 262  --  248   Lab Results  Component Value Date   TSH 2.52 05/24/2023   Lab Results  Component Value Date   HGBA1C 7.8 05/24/2023  Lab Results  Component Value Date   CHOL 158 05/18/2022   HDL 37 05/18/2022   LDLCALC 93 05/18/2022   TRIG 187 (A) 05/18/2022   CHOLHDL 3.0 12/13/2019    Significant Diagnostic Results in last 30 days:  No results found.  Assessment/Plan 1. Primary hypertension (Primary) - controlled with metoprolol   2. Vascular dementia with behavior disturbance (HCC) - no behaviors - off depakote  - dependent with ADLs except feeding - non ambulatory - weights stable - cont Seroquel   3. Type 2 diabetes mellitus with hyperglycemia, without long-term current use of insulin  (HCC) - Recent A1c 7.8 - no hypoglycemia, sugars 160-190's - metformin  allergy - cont glipizide   4. Acquired hypothyroidism - TSH 2.52 - cont levothyroxine   5. Recurrent depression (HCC) - no mood changes - cont Zoloft   6. Dysphagia, unspecified type - no recent aspirations - cont chopped meats, thin liquids     Family/ staff Communication: plan discussed with patient and nurse  Labs/tests ordered:  none

## 2023-08-12 ENCOUNTER — Encounter: Payer: Self-pay | Admitting: Orthopedic Surgery

## 2023-08-12 ENCOUNTER — Non-Acute Institutional Stay (SKILLED_NURSING_FACILITY): Admitting: Orthopedic Surgery

## 2023-08-12 DIAGNOSIS — D692 Other nonthrombocytopenic purpura: Secondary | ICD-10-CM | POA: Diagnosis not present

## 2023-08-12 DIAGNOSIS — F339 Major depressive disorder, recurrent, unspecified: Secondary | ICD-10-CM

## 2023-08-12 DIAGNOSIS — I10 Essential (primary) hypertension: Secondary | ICD-10-CM | POA: Diagnosis not present

## 2023-08-12 DIAGNOSIS — E039 Hypothyroidism, unspecified: Secondary | ICD-10-CM

## 2023-08-12 DIAGNOSIS — E1165 Type 2 diabetes mellitus with hyperglycemia: Secondary | ICD-10-CM

## 2023-08-12 DIAGNOSIS — R634 Abnormal weight loss: Secondary | ICD-10-CM

## 2023-08-12 DIAGNOSIS — R1310 Dysphagia, unspecified: Secondary | ICD-10-CM

## 2023-08-12 DIAGNOSIS — F01518 Vascular dementia, unspecified severity, with other behavioral disturbance: Secondary | ICD-10-CM | POA: Diagnosis not present

## 2023-08-12 NOTE — Progress Notes (Signed)
 Location:  [facility] Room Number: 39/A Place of Service:  SNF [phone]) Provider:  Arnetha Bhat, NP   Marguerite Shiley, MD  Patient Care Team: Marguerite Shiley, MD as PCP - General (Internal Medicine)  Extended Emergency Contact Information Primary Emergency Contact: Harris,Margaret Address: [address]          [place], [place] [address] [place] Home Phone: [phone] Mobile Phone: [phone] Relation: Daughter  Code Status:  DNR Goals of care: Advanced Directive information    07/13/2023   11:12 AM  Advanced Directives  Does Patient Have a Medical Advance Directive? Yes  Type of Estate agent of [place];Living will;Out of facility DNR (pink MOST or yellow form)  Does patient want to make changes to medical advance directive? No - Patient declined  Copy of Healthcare Power of Attorney in Chart? Yes - validated most recent copy scanned in chart (See row information)  Pre-existing out of facility DNR order (yellow form or pink MOST form) Pink MOST/Yellow Form most recent copy in chart - Physician notified to receive inpatient order     Chief Complaint  Patient presents with   Medical Management of Chronic Issues    HPI:  Pt is a 89 y.o. female seen today for medical management of chronic diseases.    She currently resides on the skilled nursing unit at [facility]. Past medical includes: HTN, constipation, T2DM, hypothyroidism, OA, urinary retention, vascular dementia, depression and anxiety.    Weight loss- see trends below  HTN- BUN/creat 12/.072 05/23/2023, remains on metoprolol  Dementia- followed by hospice> enrolled 08/2022, unable to complete BIMS/ MMSE, CT head 04/2020 age related atrophy and chronic small vessel ischemia, no behaviors, dependent with ADLs except feeding, remains on Seroquel  T2DM- A1c 7.8 (03/24)> was 8.8 (10/31), recent sugars 140's, no hypoglycemic events, allergy to metformin , remains on  glipizide  Hypothyroidism- TSH 2.52 05/23/2023, remains on levothyroxine  Depression- Na+ 138 05/23/2023, remains on Zoloft  Dysphagia- no recent aspirations, on chopped diet  No recent falls or injuries.   Recent weights:  06/01- 141.3 lbs  03/01- 149 lbs  01/30- 151.7 lbs  Recent blood pressures:  06/10- 125/70  06/03- 126/78  05/27- 137/70 Past Medical History:  Diagnosis Date   Burst fracture of lumbar vertebra, sequela 12/27/2019   Closed left hip fracture (HCC) 04/07/2020   Diabetes mellitus type 2 in obese 11/09/2017   12/13/19 Hgb a1c 6.8, Na 141, K 4.3, Bun 16, creat 0.78, eGFR 67, wbc 9.6, Hgb 12.6, plt 318, neutrophils 60.9%, LDL 51  01/10/20 wbc 14.3, Hgb 13.6, plt 523, neutrophils 70.5%, Na 142, K 3.3, Bun 27, creat 0.62, eGFR 80  01/31/20 eye exam when the patient is able.   01/08/21 Hgb a1c 5.5   Fall as cause of accidental injury in residential institution as place of occurrence 04/07/2020   Hearing loss    History of fracture of clavicle    Hypokalemia 01/21/2020   01/21/20 Na 141, K 4.1, Bun 12, creat 0.65, eGFR 79, wbc 9.2, Hgb 12.7, plt 331   Hypothyroidism    Insomnia    Right lower lobe pneumonia 01/14/2020   Tachycardia 04/03/2019   Tinnitus    Unspecified fracture of the lower end of left radius, initial encounter for closed fracture 04/07/2020   Past Surgical History:  Procedure Laterality Date   CHOLECYSTECTOMY  2007   Dr. Wendelyn Halter   CLAVICLE EXCISION  1986   INTRAMEDULLARY (IM) NAIL INTERTROCHANTERIC Left 04/09/2020  Procedure: LEFT INTERTROCHANTERIC INTRAMEDULLARY (IM) NAIL;  Surgeon: Wes Hamman, MD;  Location: [facility] OR;  Service: Orthopedics;  Laterality: Left;   MOHS SURGERY     past 10 years chest & legs   OPEN REDUCTION INTERNAL FIXATION (ORIF) DISTAL RADIAL FRACTURE Left 04/09/2020   Procedure: CLOSED REDUCTION LEFT DISTAL RADIUS FRACTURE;  Surgeon: Wes Hamman, MD;  Location: [facility] OR;  Service: Orthopedics;  Laterality: Left;    Allergies   Allergen Reactions   Metformin  And Related Diarrhea    Outpatient Encounter Medications as of 08/12/2023  Medication Sig   acetaminophen  (TYLENOL ) 325 MG tablet Take 650 mg by mouth every 8 (eight) hours as needed.   DIMETHICONE, TOPICAL, (CERAVE BABY) 1 % LOTN Apply 1 Application topically daily. Apply to shoulders, back, and chest.   docusate sodium  (COLACE) 100 MG capsule Take 100 mg by mouth daily.   Emollient (CERAVE) LOTN Apply 1 Application topically as directed. Apply to back and chest topically every shift every 2 days for dry skin.   glipiZIDE  2.5 MG TABS Take 2.5 mg by mouth every morning.   hydrocortisone  cream 1 % Apply 1 Application topically as needed for itching.   hydrocortisone  cream 1 % Apply 1 Application topically every 12 (twelve) hours as needed for itching.   ipratropium-albuterol  (DUONEB) 0.5-2.5 (3) MG/3ML SOLN Take 3 mLs by nebulization every 6 (six) hours as needed.   levothyroxine  (SYNTHROID ) 75 MCG tablet TAKE 1 TABLET (75 MCG TOTAL) BY MOUTH DAILY BEFORE BREAKFAST.   metoprolol  tartrate (LOPRESSOR ) 25 MG tablet Take 12.5 mg by mouth 2 (two) times daily.   nystatin  powder Apply 1 Application topically daily as needed.   ondansetron  (ZOFRAN ) 4 MG tablet Take 4 mg by mouth every 12 (twelve) hours as needed for nausea or vomiting.   OXYGEN 2lpm as needed   polyethylene glycol (MIRALAX  / GLYCOLAX ) 17 g packet Take 17 g by mouth daily as needed for moderate constipation.   QUEtiapine  (SEROQUEL ) 25 MG tablet Take 37.5 mg by mouth daily at 2 PM. Hold 09/13- 09/18 while on paxlovid    sertraline  (ZOLOFT ) 50 MG tablet Take 75 mg by mouth daily.   Sodium Fluoride (PREVIDENT 5000 BOOSTER PLUS) 1.1 % PSTE Place 1 Application onto teeth in the morning and at bedtime.   zinc oxide 20 % ointment Apply 1 application topically as needed for irritation.   [DISCONTINUED] divalproex  (DEPAKOTE ) 125 MG DR tablet Take 125 mg by mouth daily.   No facility-administered encounter  medications on file as of 08/12/2023.    Review of Systems  Unable to perform ROS: Dementia    Immunization History  Administered Date(s) Administered   DTaP 07/20/2013   Fluad Quad(high Dose 65+) 12/23/2021, 12/29/2022   Influenza, High Dose Seasonal PF 12/07/2016   Influenza,inj,Quad PF,6+ Mos 12/01/2017   Influenza-Unspecified 11/20/2014, 12/12/2015, 11/20/2019   Moderna Covid-19 Fall Seasonal Vaccine 62yrs & older 01/05/2023   Moderna SARS-COV2 Booster Vaccination 08/06/2020   Moderna Sars-Covid-2 Vaccination 03/05/2019, 04/02/2019   PFIZER(Purple Top)SARS-COV-2 Vaccination 04/01/2020, 08/06/2020, 11/19/2020, 01/05/2022   PPD Test 08/06/2014   Pneumococcal Conjugate-13 11/07/2013   Pneumococcal Polysaccharide-23 03/22/2017   Tetanus 03/02/2015   Zoster Recombinant(Shingrix) 06/07/2017, 08/26/2017   Zoster, Live 03/01/2006   Pertinent  Health Maintenance Due  Topic Date Due   OPHTHALMOLOGY EXAM  11/19/2022   INFLUENZA VACCINE  09/30/2023   HEMOGLOBIN A1C  11/24/2023   FOOT EXAM  12/24/2023   DEXA SCAN  Completed      09/10/2022  2:05 PM 10/25/2022    9:27 AM 12/24/2022    2:09 PM 04/20/2023    2:03 PM 07/13/2023    2:55 PM  Fall Risk  Falls in the past year? 0 0 1 0 0  Was there an injury with Fall? 0 0 0 0 0  Fall Risk Category Calculator 0 0 1 0 0  Patient at Risk for Falls Due to History of fall(s);Impaired balance/gait;Impaired mobility History of fall(s);Impaired balance/gait;Impaired mobility History of fall(s);Impaired balance/gait;Impaired mobility History of fall(s);Impaired balance/gait History of fall(s);Impaired balance/gait  Fall risk Follow up Falls evaluation completed;Education provided;Falls prevention discussed Falls evaluation completed;Education provided;Falls prevention discussed Falls evaluation completed;Education provided;Falls prevention discussed Falls evaluation completed;Education provided Falls evaluation completed   Functional Status  Survey:    Vitals:   08/12/23 1332  BP: 125/70  Resp: 19  Temp: (!) 96 F (35.6 C)  SpO2: 96%  Weight: 144 lb 3.2 oz (65.4 kg)  Height: 5' (1.524 m)   Body mass index is 28.16 kg/m. Physical Exam Vitals reviewed.  Constitutional:      General: She is not in acute distress. HENT:     Head: Normocephalic.   Eyes:     General:        Right eye: No discharge.        Left eye: No discharge.    Cardiovascular:     Rate and Rhythm: Normal rate and regular rhythm.     Pulses: Normal pulses.     Heart sounds: Normal heart sounds.  Pulmonary:     Effort: Pulmonary effort is normal. No respiratory distress.     Breath sounds: Normal breath sounds. No wheezing or rales.  Abdominal:     General: There is no distension.     Palpations: Abdomen is soft.     Tenderness: There is no abdominal tenderness. There is no guarding.   Musculoskeletal:     Cervical back: Neck supple.     Right lower leg: No edema.     Left lower leg: No edema.   Skin:    General: Skin is warm.     Capillary Refill: Capillary refill takes less than 2 seconds.     Comments: Non tender purple bruising to extremities, vary in size, no skin breakdown   Neurological:     General: No focal deficit present.     Mental Status: She is alert. Mental status is at baseline.     Gait: Gait abnormal.   Psychiatric:        Mood and Affect: Mood normal.     Comments: Follows some commands, alert to self/familiar face     Labs reviewed: Recent Labs    08/31/22 0000 05/24/23 0000  NA 137 138  K 3.9 3.9  CL 102 105  CO2 27* 25*  BUN 16 12  CREATININE 0.7 0.7  CALCIUM  9.1 8.9   No results for input(s): AST, ALT, ALKPHOS, BILITOT, PROT, ALBUMIN in the last 8760 hours. Recent Labs    08/31/22 0000 12/27/22 0000 05/24/23 0000  WBC 9.6  --  9.5  NEUTROABS 7,085.00  --  6,109.00  HGB 12.6 12.3 12.7  HCT 39  --  41  PLT 262  --  248   Lab Results  Component Value Date   TSH 2.52  05/24/2023   Lab Results  Component Value Date   HGBA1C 7.8 05/24/2023   Lab Results  Component Value Date   CHOL 158 05/18/2022   HDL 37 05/18/2022  LDLCALC 93 05/18/2022   TRIG 187 (A) 05/18/2022   CHOLHDL 3.0 12/13/2019    Significant Diagnostic Results in last 30 days:  No results found.  Assessment/Plan 1. Senile purpura (HCC) (Primary) - education given  2. Weight loss - down 10 lbs in 6 months - cont monthly weights  3. Primary hypertension - controlled with metoprolol   4. Vascular dementia with behavior disturbance (HCC) - no behaviors - dependent with ADLs except feeding  - weights trending down - nonambulatory - cont Seroquel   5. Type 2 diabetes mellitus with hyperglycemia, without long-term current use of insulin  (HCC) - A1c stable with glipizide  - sugars 140's - metformin  allergy  6. Acquired hypothyroidism - TSH stable - cont levothyroxine   7. Recurrent depression (HCC) - no mood changes - Na+ stable - cont Zoloft   8. Dysphagia, unspecified type - cont chopped meats    Family/ staff Communication: plan discussed with nursing  Labs/tests ordered:  none

## 2023-09-08 ENCOUNTER — Encounter: Payer: Self-pay | Admitting: Internal Medicine

## 2023-09-08 ENCOUNTER — Non-Acute Institutional Stay (SKILLED_NURSING_FACILITY): Payer: Self-pay | Admitting: Internal Medicine

## 2023-09-08 DIAGNOSIS — E1165 Type 2 diabetes mellitus with hyperglycemia: Secondary | ICD-10-CM | POA: Diagnosis not present

## 2023-09-08 DIAGNOSIS — R000 Tachycardia, unspecified: Secondary | ICD-10-CM | POA: Diagnosis not present

## 2023-09-08 DIAGNOSIS — F01518 Vascular dementia, unspecified severity, with other behavioral disturbance: Secondary | ICD-10-CM

## 2023-09-08 DIAGNOSIS — F339 Major depressive disorder, recurrent, unspecified: Secondary | ICD-10-CM | POA: Diagnosis not present

## 2023-09-08 DIAGNOSIS — R1310 Dysphagia, unspecified: Secondary | ICD-10-CM

## 2023-09-08 NOTE — Progress Notes (Unsigned)
 Location:  [facility] Room Number: N39-A Place of Service:  SNF [phone]) Provider:  Charlanne Fredia CROME, MD  Patient Care Team: Charlanne Fredia CROME, MD as PCP - General (Internal Medicine)  Extended Emergency Contact Information Primary Emergency Contact: Harris,Margaret Address: [address]          [place], [place] [address] [place] Home Phone: [phone] Mobile Phone: [phone] Relation: Daughter  Code Status:  DNR Goals of care: Advanced Directive information    09/08/2023   11:38 AM  Advanced Directives  Does Patient Have a Medical Advance Directive? Yes  Type of Estate agent of [place];Living will;Out of facility DNR (pink MOST or yellow form)  Does patient want to make changes to medical advance directive? No - Patient declined  Copy of Healthcare Power of Attorney in Chart? Yes - validated most recent copy scanned in chart (See row information)  Pre-existing out of facility DNR order (yellow form or pink MOST form) Pink MOST/Yellow Form most recent copy in chart - Physician notified to receive inpatient order     Chief Complaint  Patient presents with   Medical Management of Chronic Issues    Routine visit    HPI:  Pt is a 89 y.o. female seen today for medical management of chronic diseases.    [name] Resident of SNF    Patient has a history of type 2 diabetes, hyperlipidemia, depression, dementia, hypothyroidism, osteopenia, stress and urge urinary incontinence, Tachycardia  h/o Hip fracture and Compression fracture after falls    Enrolled in Hospice     She is stable. No new Nursing issues. No Behavior issues Her weight is stable Wheelchair dependent [employer] and [place] No Falls Wt Readings from Last 3 Encounters:  09/08/23 148 lb 6.4 oz (67.3 kg)  08/12/23 144 lb 3.2 oz (65.4 kg)  07/13/23 149 lb (67.6 kg)   Past Medical History:  Diagnosis Date   Burst fracture of lumbar vertebra,  sequela 12/27/2019   Closed left hip fracture (HCC) 04/07/2020   Diabetes mellitus type 2 in obese 11/09/2017   12/13/19 Hgb a1c 6.8, Na 141, K 4.3, Bun 16, creat 0.78, eGFR 67, wbc 9.6, Hgb 12.6, plt 318, neutrophils 60.9%, LDL 51  01/10/20 wbc 14.3, Hgb 13.6, plt 523, neutrophils 70.5%, Na 142, K 3.3, Bun 27, creat 0.62, eGFR 80  01/31/20 eye exam when the patient is able.   01/08/21 Hgb a1c 5.5   Fall as cause of accidental injury in residential institution as place of occurrence 04/07/2020   Hearing loss    History of fracture of clavicle    Hypokalemia 01/21/2020   01/21/20 Na 141, K 4.1, Bun 12, creat 0.65, eGFR 79, wbc 9.2, Hgb 12.7, plt 331   Hypothyroidism    Insomnia    Right lower lobe pneumonia 01/14/2020   Tachycardia 04/03/2019   Tinnitus    Unspecified fracture of the lower end of left radius, initial encounter for closed fracture 04/07/2020   Past Surgical History:  Procedure Laterality Date   CHOLECYSTECTOMY  2007   Dr. Ardella   CLAVICLE EXCISION  1986   INTRAMEDULLARY (IM) NAIL INTERTROCHANTERIC Left 04/09/2020   Procedure: LEFT INTERTROCHANTERIC INTRAMEDULLARY (IM) NAIL;  Surgeon: Jerri Kay HERO, MD;  Location: [facility] OR;  Service: Orthopedics;  Laterality: Left;   MOHS SURGERY     past 10 years chest & legs   OPEN REDUCTION INTERNAL FIXATION (ORIF) DISTAL RADIAL FRACTURE Left 04/09/2020   Procedure: CLOSED  REDUCTION LEFT DISTAL RADIUS FRACTURE;  Surgeon: Jerri Kay HERO, MD;  Location: [facility] OR;  Service: Orthopedics;  Laterality: Left;    Allergies  Allergen Reactions   Metformin  And Related Diarrhea    Outpatient Encounter Medications as of 09/08/2023  Medication Sig   acetaminophen  (TYLENOL ) 325 MG tablet Take 650 mg by mouth every 8 (eight) hours as needed.   DIMETHICONE, TOPICAL, (CERAVE BABY) 1 % LOTN Apply 1 Application topically daily. Apply to shoulders, back, and chest.   docusate sodium  (COLACE) 100 MG capsule Take 100 mg by mouth daily.   Emollient (CERAVE) LOTN  Apply 1 Application topically as directed. Apply to back and chest topically every shift every 2 days for dry skin.   glipiZIDE  2.5 MG TABS Take 2.5 mg by mouth every morning.   hydrocortisone  cream 1 % Apply 1 Application topically as needed for itching.   hydrocortisone  cream 1 % Apply 1 Application topically every 12 (twelve) hours as needed for itching.   ipratropium-albuterol  (DUONEB) 0.5-2.5 (3) MG/3ML SOLN Take 3 mLs by nebulization every 6 (six) hours as needed.   levothyroxine  (SYNTHROID ) 75 MCG tablet TAKE 1 TABLET (75 MCG TOTAL) BY MOUTH DAILY BEFORE BREAKFAST.   metoprolol  tartrate (LOPRESSOR ) 25 MG tablet Take 12.5 mg by mouth 2 (two) times daily.   nystatin  powder Apply 1 Application topically daily as needed.   ondansetron  (ZOFRAN ) 4 MG tablet Take 4 mg by mouth every 12 (twelve) hours as needed for nausea or vomiting.   OXYGEN 2lpm as needed   polyethylene glycol (MIRALAX  / GLYCOLAX ) 17 g packet Take 17 g by mouth daily as needed for moderate constipation.   pramoxine-hydrocortisone  1-1 % lotion Apply topically daily.   QUEtiapine  (SEROQUEL ) 25 MG tablet Take 37.5 mg by mouth daily at 2 PM. Hold 09/13- 09/18 while on paxlovid    sertraline  (ZOLOFT ) 50 MG tablet Take 75 mg by mouth daily.   Sodium Fluoride (PREVIDENT 5000 BOOSTER PLUS) 1.1 % PSTE Place 1 Application onto teeth in the morning and at bedtime.   zinc oxide 20 % ointment Apply 1 application topically as needed for irritation.   [DISCONTINUED] divalproex  (DEPAKOTE ) 125 MG DR tablet Take 125 mg by mouth daily.   No facility-administered encounter medications on file as of 09/08/2023.    Review of Systems  Unable to perform ROS: Dementia    Immunization History  Administered Date(s) Administered   DTaP 07/20/2013   Fluad Quad(high Dose 65+) 12/23/2021, 12/29/2022   Influenza, High Dose Seasonal PF 12/07/2016   Influenza,inj,Quad PF,6+ Mos 12/01/2017   Influenza-Unspecified 11/20/2014, 12/12/2015, 11/20/2019    Moderna Covid-19 Fall Seasonal Vaccine 7yrs & older 01/05/2023   Moderna SARS-COV2 Booster Vaccination 08/06/2020   Moderna Sars-Covid-2 Vaccination 03/05/2019, 04/02/2019   PFIZER(Purple Top)SARS-COV-2 Vaccination 04/01/2020, 08/06/2020, 11/19/2020, 01/05/2022   PPD Test 08/06/2014   Pneumococcal Conjugate-13 11/07/2013   Pneumococcal Polysaccharide-23 03/22/2017   Tetanus 03/02/2015   Zoster Recombinant(Shingrix) 06/07/2017, 08/26/2017   Zoster, Live 03/01/2006   Pertinent  Health Maintenance Due  Topic Date Due   OPHTHALMOLOGY EXAM  11/19/2022   INFLUENZA VACCINE  09/30/2023   HEMOGLOBIN A1C  11/24/2023   FOOT EXAM  12/24/2023   DEXA SCAN  Completed      09/10/2022    2:05 PM 10/25/2022    9:27 AM 12/24/2022    2:09 PM 04/20/2023    2:03 PM 07/13/2023    2:55 PM  Fall Risk  Falls in the past year? 0 0 1 0 0  Was  there an injury with Fall? 0 0 0 0 0  Fall Risk Category Calculator 0 0 1 0 0  Patient at Risk for Falls Due to History of fall(s);Impaired balance/gait;Impaired mobility History of fall(s);Impaired balance/gait;Impaired mobility History of fall(s);Impaired balance/gait;Impaired mobility History of fall(s);Impaired balance/gait History of fall(s);Impaired balance/gait  Fall risk Follow up Falls evaluation completed;Education provided;Falls prevention discussed Falls evaluation completed;Education provided;Falls prevention discussed Falls evaluation completed;Education provided;Falls prevention discussed Falls evaluation completed;Education provided Falls evaluation completed   Functional Status Survey:    Vitals:   09/08/23 1124  BP: 123/68  Pulse: 83  SpO2: 96%  Weight: 148 lb 6.4 oz (67.3 kg)  Height: 5' (1.524 m)   Body mass index is 28.98 kg/m. Physical Exam Vitals reviewed.  Constitutional:      Appearance: Normal appearance.  HENT:     Head: Normocephalic.     Nose: Nose normal.     Mouth/Throat:     Mouth: Mucous membranes are moist.     Pharynx:  Oropharynx is clear.  Eyes:     Pupils: Pupils are equal, round, and reactive to light.  Cardiovascular:     Rate and Rhythm: Regular rhythm. Tachycardia present.     Pulses: Normal pulses.     Heart sounds: Normal heart sounds. No murmur heard. Pulmonary:     Effort: Pulmonary effort is normal.     Breath sounds: Normal breath sounds.  Abdominal:     General: Abdomen is flat. Bowel sounds are normal.     Palpations: Abdomen is soft.  Musculoskeletal:        General: No swelling.     Cervical back: Neck supple.  Skin:    General: Skin is warm.  Neurological:     General: No focal deficit present.     Mental Status: She is alert.  Psychiatric:        Mood and Affect: Mood normal.        Thought Content: Thought content normal.     Labs reviewed: Recent Labs    05/24/23 0000  NA 138  K 3.9  CL 105  CO2 25*  BUN 12  CREATININE 0.7  CALCIUM  8.9   No results for input(s): AST, ALT, ALKPHOS, BILITOT, PROT, ALBUMIN in the last 8760 hours. Recent Labs    12/27/22 0000 05/24/23 0000  WBC  --  9.5  NEUTROABS  --  6,109.00  HGB 12.3 12.7  HCT  --  41  PLT  --  248   Lab Results  Component Value Date   TSH 2.52 05/24/2023   Lab Results  Component Value Date   HGBA1C 7.8 05/24/2023   Lab Results  Component Value Date   CHOL 158 05/18/2022   HDL 37 05/18/2022   LDLCALC 93 05/18/2022   TRIG 187 (A) 05/18/2022   CHOLHDL 3.0 12/13/2019    Significant Diagnostic Results in last 30 days:  No results found.  Assessment/Plan 1. Type 2 diabetes mellitus with hyperglycemia, without long-term current use of insulin  (HCC) (Primary) Low dose of Glipizide  CBGS 140-180 A1C 7.8 in 3/25  2. Vascular dementia with behavior disturbance (HCC) On low dose of Seroquel  Will See if we can reduce the dose  3. Recurrent depression (HCC) Zoloft   4. Tachycardia Low dose of Lopressor   5. Dysphagia, unspecified type Chopped Needs help with Feeding  some    Family/ staff Communication:   Labs/tests ordered:

## 2023-10-05 ENCOUNTER — Non-Acute Institutional Stay (SKILLED_NURSING_FACILITY): Payer: Self-pay | Admitting: Orthopedic Surgery

## 2023-10-05 ENCOUNTER — Encounter: Payer: Self-pay | Admitting: Orthopedic Surgery

## 2023-10-05 DIAGNOSIS — E039 Hypothyroidism, unspecified: Secondary | ICD-10-CM

## 2023-10-05 DIAGNOSIS — I10 Essential (primary) hypertension: Secondary | ICD-10-CM

## 2023-10-05 DIAGNOSIS — E1165 Type 2 diabetes mellitus with hyperglycemia: Secondary | ICD-10-CM | POA: Diagnosis not present

## 2023-10-05 DIAGNOSIS — F03918 Unspecified dementia, unspecified severity, with other behavioral disturbance: Secondary | ICD-10-CM | POA: Diagnosis not present

## 2023-10-05 DIAGNOSIS — R1310 Dysphagia, unspecified: Secondary | ICD-10-CM

## 2023-10-05 DIAGNOSIS — L89311 Pressure ulcer of right buttock, stage 1: Secondary | ICD-10-CM

## 2023-10-05 DIAGNOSIS — F339 Major depressive disorder, recurrent, unspecified: Secondary | ICD-10-CM

## 2023-10-05 NOTE — Progress Notes (Signed)
 Location:   [facility] Room Number: 39-A Place of Service:  SNF [phone]) Provider:  Greig Cluster, NP  PCP: Charlanne Fredia CROME, MD  Patient Care Team: Charlanne Fredia CROME, MD as PCP - General (Internal Medicine)  Extended Emergency Contact Information Primary Emergency Contact: Harris,Margaret Address: [address]          [place], [place] [address] [place] Home Phone: [phone] Mobile Phone: [phone] Relation: Daughter  Code Status:  DNR Goals of care: Advanced Directive information    10/05/2023   10:24 AM  Advanced Directives  Does Patient Have a Medical Advance Directive? Yes  Type of Estate agent of [place];Living will;Out of facility DNR (pink MOST or yellow form)  Does patient want to make changes to medical advance directive? No - Patient declined  Copy of Healthcare Power of Attorney in Chart? Yes - validated most recent copy scanned in chart (See row information)     Chief Complaint  Patient presents with   Medical Management of Chronic Issues    Routine visit. Discuss the need for Covid Booster, Influenza vaccine, and Eye exam.    HPI:  Pt is a 89 y.o. female seen today for medical management of chronic diseases.    She currently resides on the skilled nursing unit at [facility]. Past medical includes: HTN, constipation, T2DM, hypothyroidism, OA, urinary retention, vascular dementia, depression and anxiety.    Followed by hospice.    Stage I PU right buttocks/thigh- noted during skin examination HTN- BUN/creat 12/.072 05/23/2023, remains on metoprolol  Dementia- followed by hospice> enrolled 08/2022, unable to perform BIMS/ MMSE, CT head 04/2020 age related atrophy and chronic small vessel ischemia, no behaviors, dependent with ADLs except feeding, nonambulatory, off Depakote , remains on Seroquel > recently reduced T2DM- A1c 7.8 (03/24)> was 8.8 (10/31), recent sugars 140-18's, no hypoglycemic events,  allergy to metformin , remains on glipizide  Hypothyroidism- TSH 2.52 05/23/2023, remains on levothyroxine  Depression- Na+ 138 05/23/2023, remains on Zoloft  Dysphagia- no recent aspirations, on chopped diet  Recent weights:  08/01- 140.7 lbs  07/07- 148.4 lbs  06/01- 141.3 lbs  Recent blood pressures:  08/05- 119/69  07/29- 114/71  07/22- 141/77  Past Medical History:  Diagnosis Date   Burst fracture of lumbar vertebra, sequela 12/27/2019   Closed left hip fracture (HCC) 04/07/2020   Diabetes mellitus type 2 in obese 11/09/2017   12/13/19 Hgb a1c 6.8, Na 141, K 4.3, Bun 16, creat 0.78, eGFR 67, wbc 9.6, Hgb 12.6, plt 318, neutrophils 60.9%, LDL 51  01/10/20 wbc 14.3, Hgb 13.6, plt 523, neutrophils 70.5%, Na 142, K 3.3, Bun 27, creat 0.62, eGFR 80  01/31/20 eye exam when the patient is able.   01/08/21 Hgb a1c 5.5   Fall as cause of accidental injury in residential institution as place of occurrence 04/07/2020   Hearing loss    History of fracture of clavicle    Hypokalemia 01/21/2020   01/21/20 Na 141, K 4.1, Bun 12, creat 0.65, eGFR 79, wbc 9.2, Hgb 12.7, plt 331   Hypothyroidism    Insomnia    Right lower lobe pneumonia 01/14/2020   Tachycardia 04/03/2019   Tinnitus    Unspecified fracture of the lower end of left radius, initial encounter for closed fracture 04/07/2020   Past Surgical History:  Procedure Laterality Date   CHOLECYSTECTOMY  2007   Dr. Ardella   CLAVICLE EXCISION  1986   INTRAMEDULLARY (IM) NAIL INTERTROCHANTERIC Left 04/09/2020   Procedure: LEFT INTERTROCHANTERIC  INTRAMEDULLARY (IM) NAIL;  Surgeon: Jerri Kay HERO, MD;  Location: [facility] OR;  Service: Orthopedics;  Laterality: Left;   MOHS SURGERY     past 10 years chest & legs   OPEN REDUCTION INTERNAL FIXATION (ORIF) DISTAL RADIAL FRACTURE Left 04/09/2020   Procedure: CLOSED REDUCTION LEFT DISTAL RADIUS FRACTURE;  Surgeon: Jerri Kay HERO, MD;  Location: [facility] OR;  Service: Orthopedics;  Laterality: Left;    Allergies   Allergen Reactions   Metformin  And Related Diarrhea    Allergies as of 10/05/2023       Reactions   Metformin  And Related Diarrhea        Medication List        Accurate as of October 05, 2023 10:32 AM. If you have any questions, ask your nurse or doctor.          acetaminophen  325 MG tablet Commonly known as: TYLENOL  Take 650 mg by mouth every 8 (eight) hours as needed.   CeraVe Baby 1 % Lotn Generic drug: DIMETHICONE (TOPICAL) Apply 1 Application topically daily. Apply to shoulders, back, and chest.   CeraVe Lotn Apply 1 Application topically as directed. Apply to back and chest topically every shift every 2 days for dry skin.   docusate sodium  100 MG capsule Commonly known as: COLACE Take 100 mg by mouth daily.   glipiZIDE  2.5 MG Tabs Take 2.5 mg by mouth every morning.   hydrocortisone  cream 1 % Apply 1 Application topically as needed for itching.   hydrocortisone  cream 1 % Apply 1 Application topically every 12 (twelve) hours as needed for itching.   ipratropium-albuterol  0.5-2.5 (3) MG/3ML Soln Commonly known as: DUONEB Take 3 mLs by nebulization every 6 (six) hours as needed.   levothyroxine  75 MCG tablet Commonly known as: SYNTHROID  TAKE 1 TABLET (75 MCG TOTAL) BY MOUTH DAILY BEFORE BREAKFAST.   metoprolol  tartrate 25 MG tablet Commonly known as: LOPRESSOR  Take 12.5 mg by mouth 2 (two) times daily.   nystatin  powder Apply 1 Application topically daily as needed.   ondansetron  4 MG tablet Commonly known as: ZOFRAN  Take 4 mg by mouth every 12 (twelve) hours as needed for nausea or vomiting.   OXYGEN 2lpm as needed   polyethylene glycol 17 g packet Commonly known as: MIRALAX  / GLYCOLAX  Take 17 g by mouth daily as needed for moderate constipation.   pramoxine-hydrocortisone  1-1 % lotion Apply topically daily.   PreviDent 5000 Booster Plus 1.1 % Pste Generic drug: Sodium Fluoride Place 1 Application onto teeth in the morning and at  bedtime.   QUEtiapine  25 MG tablet Commonly known as: SEROQUEL  Take 25 mg by mouth daily at 2 PM. Hold 09/13- 09/18 while on paxlovid    sertraline  50 MG tablet Commonly known as: ZOLOFT  Take 75 mg by mouth daily.   zinc oxide 20 % ointment Apply 1 application topically as needed for irritation.        Review of Systems  Unable to perform ROS: Dementia    Immunization History  Administered Date(s) Administered   DTaP 07/20/2013   Fluad Quad(high Dose 65+) 12/23/2021, 12/29/2022   Influenza, High Dose Seasonal PF 12/07/2016   Influenza,inj,Quad PF,6+ Mos 12/01/2017   Influenza-Unspecified 11/20/2014, 12/12/2015, 11/20/2019   Moderna Covid-19 Fall Seasonal Vaccine 34yrs & older 01/05/2023   Moderna SARS-COV2 Booster Vaccination 08/06/2020   Moderna Sars-Covid-2 Vaccination 03/05/2019, 04/02/2019   PFIZER(Purple Top)SARS-COV-2 Vaccination 04/01/2020, 08/06/2020, 11/19/2020, 01/05/2022   PPD Test 08/06/2014   Pneumococcal Conjugate-13 11/07/2013   Pneumococcal Polysaccharide-23 03/22/2017  Tetanus 03/02/2015   Zoster Recombinant(Shingrix) 06/07/2017, 08/26/2017   Zoster, Live 03/01/2006   Pertinent  Health Maintenance Due  Topic Date Due   OPHTHALMOLOGY EXAM  11/19/2022   INFLUENZA VACCINE  09/30/2023   HEMOGLOBIN A1C  11/24/2023   FOOT EXAM  12/24/2023   DEXA SCAN  Completed      09/10/2022    2:05 PM 10/25/2022    9:27 AM 12/24/2022    2:09 PM 04/20/2023    2:03 PM 07/13/2023    2:55 PM  Fall Risk  Falls in the past year? 0 0 1 0 0  Was there an injury with Fall? 0 0 0 0 0  Fall Risk Category Calculator 0 0 1 0 0  Patient at Risk for Falls Due to History of fall(s);Impaired balance/gait;Impaired mobility History of fall(s);Impaired balance/gait;Impaired mobility History of fall(s);Impaired balance/gait;Impaired mobility History of fall(s);Impaired balance/gait History of fall(s);Impaired balance/gait  Fall risk Follow up Falls evaluation completed;Education  provided;Falls prevention discussed Falls evaluation completed;Education provided;Falls prevention discussed Falls evaluation completed;Education provided;Falls prevention discussed Falls evaluation completed;Education provided Falls evaluation completed   Functional Status Survey:    Vitals:   10/05/23 1022  BP: 119/69  Pulse: 91  Resp: 16  Temp: (!) 97.2 F (36.2 C)  SpO2: 93%  Weight: 140 lb 11.2 oz (63.8 kg)  Height: 5' (1.524 m)   Body mass index is 27.48 kg/m. Physical Exam Vitals reviewed.  Constitutional:      General: She is not in acute distress. HENT:     Head: Normocephalic.     Right Ear: There is no impacted cerumen.     Left Ear: There is no impacted cerumen.     Nose: Nose normal.     Mouth/Throat:     Mouth: Mucous membranes are moist.  Eyes:     General:        Right eye: No discharge.        Left eye: No discharge.  Cardiovascular:     Rate and Rhythm: Normal rate and regular rhythm.     Pulses:          Dorsalis pedis pulses are 1+ on the right side and 1+ on the left side.       Posterior tibial pulses are 1+ on the right side and 1+ on the left side.     Heart sounds: Normal heart sounds.  Pulmonary:     Effort: Pulmonary effort is normal. No respiratory distress.     Breath sounds: Normal breath sounds. No wheezing or rales.  Abdominal:     General: Bowel sounds are normal. There is no distension.     Palpations: Abdomen is soft.     Tenderness: There is no abdominal tenderness.  Musculoskeletal:     Cervical back: Neck supple.     Right lower leg: No edema.     Left lower leg: No edema.  Feet:     Right foot:     Skin integrity: Dry skin present. No skin breakdown.     Toenail Condition: Right toenails are long.     Left foot:     Skin integrity: Dry skin present. No skin breakdown.     Toenail Condition: Left toenails are long.  Skin:    General: Skin is warm.     Capillary Refill: Capillary refill takes less than 2 seconds.      Comments: Approx < 0.5 cm skin tear/ stage I PU x 2 to right buttocks/posterior thigh, granulation  tissue present, surrounding tissue blanchable  Neurological:     General: No focal deficit present.     Mental Status: She is alert. Mental status is at baseline.     Gait: Gait abnormal.  Psychiatric:        Mood and Affect: Mood normal.     Labs reviewed: Recent Labs    05/24/23 0000  NA 138  K 3.9  CL 105  CO2 25*  BUN 12  CREATININE 0.7  CALCIUM  8.9   No results for input(s): AST, ALT, ALKPHOS, BILITOT, PROT, ALBUMIN in the last 8760 hours. Recent Labs    12/27/22 0000 05/24/23 0000  WBC  --  9.5  NEUTROABS  --  6,109.00  HGB 12.3 12.7  HCT  --  41  PLT  --  248   Lab Results  Component Value Date   TSH 2.52 05/24/2023   Lab Results  Component Value Date   HGBA1C 7.8 05/24/2023   Lab Results  Component Value Date   CHOL 158 05/18/2022   HDL 37 05/18/2022   LDLCALC 93 05/18/2022   TRIG 187 (A) 05/18/2022   CHOLHDL 3.0 12/13/2019    Significant Diagnostic Results in last 30 days:  No results found.  Assessment/Plan 1. Pressure injury of right buttock, stage 1 (Primary) - noted on exam - increased risk due to incontinence - cont zinc oxide and frequent turning  2. Primary hypertension - controlled with metoprolol   3. Dementia with behavioral disturbance (HCC) - improved behaviors - followed by hospice - dependent with ADLs - nonambulatory - lost 10 lbs in 6 months  - off depakote  - cont reduced Seroquel   4. Type 2 diabetes mellitus with hyperglycemia, without long-term current use of insulin  (HCC) - A1c stable - no hypoglycemia - sugars 140-180's - cont glipizide   5. Acquired hypothyroidism - TSH stable - cont levothyroxine   6. Recurrent depression (HCC) - no mood changes - cont zoloft   7. Dysphagia, unspecified type - no recent aspirations    Family/ staff Communication: plan discussed with patient and  nurse  Labs/tests ordered:  none

## 2023-11-02 ENCOUNTER — Non-Acute Institutional Stay (SKILLED_NURSING_FACILITY): Payer: Self-pay | Admitting: Orthopedic Surgery

## 2023-11-02 ENCOUNTER — Encounter: Payer: Self-pay | Admitting: Orthopedic Surgery

## 2023-11-02 DIAGNOSIS — R1310 Dysphagia, unspecified: Secondary | ICD-10-CM

## 2023-11-02 DIAGNOSIS — E039 Hypothyroidism, unspecified: Secondary | ICD-10-CM

## 2023-11-02 DIAGNOSIS — I10 Essential (primary) hypertension: Secondary | ICD-10-CM

## 2023-11-02 DIAGNOSIS — E1165 Type 2 diabetes mellitus with hyperglycemia: Secondary | ICD-10-CM | POA: Diagnosis not present

## 2023-11-02 DIAGNOSIS — F03918 Unspecified dementia, unspecified severity, with other behavioral disturbance: Secondary | ICD-10-CM | POA: Diagnosis not present

## 2023-11-02 DIAGNOSIS — F339 Major depressive disorder, recurrent, unspecified: Secondary | ICD-10-CM

## 2023-11-02 NOTE — Progress Notes (Unsigned)
 Location:   [facility] Room Number: 39-A Place of Service:  SNF [phone]) Provider:  Greig Cluster, NP  PCP: Charlanne Fredia CROME, MD  Patient Care Team: Charlanne Fredia CROME, MD as PCP - General (Internal Medicine)  Extended Emergency Contact Information Primary Emergency Contact: Harris,Margaret Address: [address]          [place], [place] [address] [place] Home Phone: [phone] Mobile Phone: [phone] Relation: Daughter  Code Status:  DNR Goals of care: Advanced Directive information    11/02/2023   11:52 AM  Advanced Directives  Does Patient Have a Medical Advance Directive? Yes  Type of Estate agent of [place];Living will;Out of facility DNR (pink MOST or yellow form)  Does patient want to make changes to medical advance directive? No - Patient declined  Copy of Healthcare Power of Attorney in Chart? Yes - validated most recent copy scanned in chart (See row information)     Chief Complaint  Patient presents with   Medical Management of Chronic Issues    Routine visit. Discuss the need for Influenza vaccine, Covid Booster, and Eye exam.    HPI:  Pt is a 89 y.o. female seen today for medical management of chronic diseases.    She currently resides on the skilled nursing unit at [facility]. Past medical includes: HTN, constipation, T2DM, hypothyroidism, OA, urinary retention, vascular dementia, depression and anxiety.     Followed by hospice.    HTN- BUN/creat 12/.072 05/23/2023, remains on metoprolol  Dementia- followed by hospice> enrolled 08/2022, unable to perform BIMS/ MMSE, CT head 04/2020 age related atrophy and chronic small vessel ischemia, no behaviors> sleeping more during day> off depakote , weight loss of 4 lbs from last month, dependent with ADLs except feeding, nonambulatory, remains on Seroquel > recently reduced T2DM- A1c 7.8 (03/24)> was 8.8 (10/31), recent sugars 140-18's, no hypoglycemic events,  allergy to metformin , remains on glipizide  Hypothyroidism- TSH 2.52 05/23/2023, remains on levothyroxine  Depression- Na+ 138 05/23/2023, remains on Zoloft  Dysphagia- no recent aspirations, on chopped diet  Recent weights:  08/01- 140.7 lbs  07/07- 148.4 lbs  06/06- 144.2 lbs  Recent blood pressures:  09/02- 140/79  08/26- 132/71  08/19- 168/79    Past Medical History:  Diagnosis Date   Burst fracture of lumbar vertebra, sequela 12/27/2019   Closed left hip fracture (HCC) 04/07/2020   Diabetes mellitus type 2 in obese 11/09/2017   12/13/19 Hgb a1c 6.8, Na 141, K 4.3, Bun 16, creat 0.78, eGFR 67, wbc 9.6, Hgb 12.6, plt 318, neutrophils 60.9%, LDL 51  01/10/20 wbc 14.3, Hgb 13.6, plt 523, neutrophils 70.5%, Na 142, K 3.3, Bun 27, creat 0.62, eGFR 80  01/31/20 eye exam when the patient is able.   01/08/21 Hgb a1c 5.5   Fall as cause of accidental injury in residential institution as place of occurrence 04/07/2020   Hearing loss    History of fracture of clavicle    Hypokalemia 01/21/2020   01/21/20 Na 141, K 4.1, Bun 12, creat 0.65, eGFR 79, wbc 9.2, Hgb 12.7, plt 331   Hypothyroidism    Insomnia    Right lower lobe pneumonia 01/14/2020   Tachycardia 04/03/2019   Tinnitus    Unspecified fracture of the lower end of left radius, initial encounter for closed fracture 04/07/2020   Past Surgical History:  Procedure Laterality Date   CHOLECYSTECTOMY  2007   Dr. Ardella   CLAVICLE EXCISION  1986   INTRAMEDULLARY (IM) NAIL INTERTROCHANTERIC  Left 04/09/2020   Procedure: LEFT INTERTROCHANTERIC INTRAMEDULLARY (IM) NAIL;  Surgeon: Jerri Kay HERO, MD;  Location: [facility] OR;  Service: Orthopedics;  Laterality: Left;   MOHS SURGERY     past 10 years chest & legs   OPEN REDUCTION INTERNAL FIXATION (ORIF) DISTAL RADIAL FRACTURE Left 04/09/2020   Procedure: CLOSED REDUCTION LEFT DISTAL RADIUS FRACTURE;  Surgeon: Jerri Kay HERO, MD;  Location: [facility] OR;  Service: Orthopedics;  Laterality: Left;    Allergies   Allergen Reactions   Metformin  And Related Diarrhea    Allergies as of 11/02/2023       Reactions   Metformin  And Related Diarrhea        Medication List        Accurate as of November 02, 2023 12:00 PM. If you have any questions, ask your nurse or doctor.          acetaminophen  325 MG tablet Commonly known as: TYLENOL  Take 650 mg by mouth every 8 (eight) hours as needed.   CeraVe Baby 1 % Lotn Generic drug: DIMETHICONE (TOPICAL) Apply 1 Application topically daily. Apply to shoulders, back, and chest.   CeraVe Lotn Apply 1 Application topically as directed. Apply to back and chest topically every shift every 2 days for dry skin.   docusate sodium  100 MG capsule Commonly known as: COLACE Take 100 mg by mouth daily.   glipiZIDE  2.5 MG Tabs Take 2.5 mg by mouth every morning.   hydrocortisone  cream 1 % Apply 1 Application topically as needed for itching.   hydrocortisone  cream 1 % Apply 1 Application topically every 12 (twelve) hours as needed for itching.   ipratropium-albuterol  0.5-2.5 (3) MG/3ML Soln Commonly known as: DUONEB Take 3 mLs by nebulization every 6 (six) hours as needed.   levothyroxine  75 MCG tablet Commonly known as: SYNTHROID  TAKE 1 TABLET (75 MCG TOTAL) BY MOUTH DAILY BEFORE BREAKFAST.   metoprolol  tartrate 25 MG tablet Commonly known as: LOPRESSOR  Take 12.5 mg by mouth 2 (two) times daily.   nystatin  powder Apply 1 Application topically daily as needed.   ondansetron  4 MG tablet Commonly known as: ZOFRAN  Take 4 mg by mouth every 12 (twelve) hours as needed for nausea or vomiting.   OXYGEN 2lpm as needed   polyethylene glycol 17 g packet Commonly known as: MIRALAX  / GLYCOLAX  Take 17 g by mouth daily as needed for moderate constipation.   pramoxine-hydrocortisone  1-1 % lotion Apply topically daily.   PreviDent 5000 Booster Plus 1.1 % Pste Generic drug: Sodium Fluoride Place 1 Application onto teeth in the morning and at  bedtime.   QUEtiapine  25 MG tablet Commonly known as: SEROQUEL  Take 25 mg by mouth daily at 2 PM. Hold 09/13- 09/18 while on paxlovid    sertraline  50 MG tablet Commonly known as: ZOLOFT  Take 75 mg by mouth daily.   zinc oxide 20 % ointment Apply 1 application topically as needed for irritation.        Review of Systems  Unable to perform ROS: Dementia    Immunization History  Administered Date(s) Administered   DTaP 07/20/2013   Fluad Quad(high Dose 65+) 12/23/2021, 12/29/2022   INFLUENZA, HIGH DOSE SEASONAL PF 12/07/2016   Influenza,inj,Quad PF,6+ Mos 12/01/2017   Influenza-Unspecified 11/20/2014, 12/12/2015, 11/20/2019   Moderna Covid-19 Fall Seasonal Vaccine 60yrs & older 01/05/2023   Moderna SARS-COV2 Booster Vaccination 08/06/2020   Moderna Sars-Covid-2 Vaccination 03/05/2019, 04/02/2019   PFIZER(Purple Top)SARS-COV-2 Vaccination 04/01/2020, 08/06/2020, 11/19/2020, 01/05/2022   PPD Test 08/06/2014   Pneumococcal Conjugate-13  11/07/2013   Pneumococcal Polysaccharide-23 03/22/2017   Tetanus 03/02/2015   Zoster Recombinant(Shingrix) 06/07/2017, 08/26/2017   Zoster, Live 03/01/2006   Pertinent  Health Maintenance Due  Topic Date Due   OPHTHALMOLOGY EXAM  11/19/2022   INFLUENZA VACCINE  09/30/2023   HEMOGLOBIN A1C  11/24/2023   FOOT EXAM  12/24/2023   DEXA SCAN  Completed      09/10/2022    2:05 PM 10/25/2022    9:27 AM 12/24/2022    2:09 PM 04/20/2023    2:03 PM 07/13/2023    2:55 PM  Fall Risk  Falls in the past year? 0 0 1 0 0  Was there an injury with Fall? 0 0 0 0 0  Fall Risk Category Calculator 0 0 1 0 0  Patient at Risk for Falls Due to History of fall(s);Impaired balance/gait;Impaired mobility History of fall(s);Impaired balance/gait;Impaired mobility History of fall(s);Impaired balance/gait;Impaired mobility History of fall(s);Impaired balance/gait History of fall(s);Impaired balance/gait  Fall risk Follow up Falls evaluation completed;Education  provided;Falls prevention discussed Falls evaluation completed;Education provided;Falls prevention discussed Falls evaluation completed;Education provided;Falls prevention discussed Falls evaluation completed;Education provided Falls evaluation completed   Functional Status Survey:    Vitals:   11/02/23 1153  BP: (!) 140/79  Pulse: 87  Resp: 16  Temp: (!) 97.2 F (36.2 C)  SpO2: 96%  Weight: 140 lb 11.2 oz (63.8 kg)  Height: 5' (1.524 m)   Body mass index is 27.48 kg/m. Physical Exam Vitals reviewed.  Constitutional:      General: She is not in acute distress. HENT:     Head: Normocephalic.     Ears:     Comments: Bilateral hearing aids    Nose: Nose normal.     Mouth/Throat:     Mouth: Mucous membranes are moist.  Eyes:     General:        Right eye: No discharge.        Left eye: No discharge.  Cardiovascular:     Rate and Rhythm: Normal rate and regular rhythm.     Pulses: Normal pulses.     Heart sounds: Normal heart sounds.  Pulmonary:     Effort: Pulmonary effort is normal.     Breath sounds: Normal breath sounds.  Abdominal:     General: Bowel sounds are normal. There is no distension.     Palpations: Abdomen is soft.     Tenderness: There is no abdominal tenderness.  Musculoskeletal:     Cervical back: Neck supple.     Right lower leg: No edema.     Left lower leg: No edema.  Skin:    General: Skin is warm.     Capillary Refill: Capillary refill takes less than 2 seconds.     Comments: Pressure sore to buttocks healed  Neurological:     General: No focal deficit present.     Mental Status: She is alert. Mental status is at baseline.     Motor: Weakness present.     Gait: Gait abnormal.  Psychiatric:        Mood and Affect: Mood normal.     Comments: Aphasia, alert to self/familiar face, does not follow commands      Labs reviewed: Recent Labs    05/24/23 0000  NA 138  K 3.9  CL 105  CO2 25*  BUN 12  CREATININE 0.7  CALCIUM  8.9   No  results for input(s): AST, ALT, ALKPHOS, BILITOT, PROT, ALBUMIN in the last 8760 hours. Recent  Labs    12/27/22 0000 05/24/23 0000  WBC  --  9.5  NEUTROABS  --  6,109.00  HGB 12.3 12.7  HCT  --  41  PLT  --  248   Lab Results  Component Value Date   TSH 2.52 05/24/2023   Lab Results  Component Value Date   HGBA1C 7.8 05/24/2023   Lab Results  Component Value Date   CHOL 158 05/18/2022   HDL 37 05/18/2022   LDLCALC 93 05/18/2022   TRIG 187 (A) 05/18/2022   CHOLHDL 3.0 12/13/2019    Significant Diagnostic Results in last 30 days:  No results found.  Assessment/Plan 1. Primary hypertension (Primary) - controlled with metoprolol   2. Dementia with behavioral disturbance (HCC) - followed by hospice - sleeping more - weight down 4 lbs from last month - dependent with ADLs except feeding  - off Depakote  - cont Seroquel   3. Type 2 diabetes mellitus with hyperglycemia, without long-term current use of insulin  (HCC) - recent A1c 7.8 - metformin  allergy - no hypoglycemia - cont glipizide   4. Acquired hypothyroidism - TSH stable - cont levothyroxine   5. Recurrent depression (HCC) - no changes in mood  - Na+ stable - cont levothyroxine    6. Dysphagia, unspecified type - no recent aspirations - cont chopped diet    Family/ staff Communication: plan discussed with nurse  Labs/tests ordered:  bmp, TSH, A1c 11/07/2023

## 2023-11-07 LAB — BASIC METABOLIC PANEL WITH GFR
BUN: 16 (ref 4–21)
CO2: 28 — AB (ref 13–22)
Chloride: 103 (ref 99–108)
Creatinine: 0.9 (ref 0.5–1.1)
Glucose: 206
Potassium: 4.2 meq/L (ref 3.5–5.1)
Sodium: 141 (ref 137–147)

## 2023-11-07 LAB — HEMOGLOBIN A1C: Hemoglobin A1C: 7.1

## 2023-11-07 LAB — COMPREHENSIVE METABOLIC PANEL WITH GFR
Calcium: 9.2 (ref 8.7–10.7)
eGFR: 60

## 2023-11-17 ENCOUNTER — Non-Acute Institutional Stay (SKILLED_NURSING_FACILITY): Payer: Self-pay | Admitting: Internal Medicine

## 2023-11-17 ENCOUNTER — Encounter: Payer: Self-pay | Admitting: Internal Medicine

## 2023-11-17 DIAGNOSIS — L89159 Pressure ulcer of sacral region, unspecified stage: Secondary | ICD-10-CM
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
## 2024-03-08 ENCOUNTER — Non-Acute Institutional Stay (SKILLED_NURSING_FACILITY): Payer: Self-pay | Admitting: Internal Medicine

## 2024-03-08 DIAGNOSIS — E1165 Type 2 diabetes mellitus with hyperglycemia: Secondary | ICD-10-CM

## 2024-03-08 DIAGNOSIS — E039 Hypothyroidism, unspecified: Secondary | ICD-10-CM | POA: Diagnosis not present

## 2024-03-08 DIAGNOSIS — F02C3 Dementia in other diseases classified elsewhere, severe, with mood disturbance: Secondary | ICD-10-CM

## 2024-03-08 DIAGNOSIS — G301 Alzheimer's disease with late onset: Secondary | ICD-10-CM | POA: Diagnosis not present

## 2024-03-08 DIAGNOSIS — R634 Abnormal weight loss: Secondary | ICD-10-CM | POA: Diagnosis not present

## 2024-03-08 DIAGNOSIS — F339 Major depressive disorder, recurrent, unspecified: Secondary | ICD-10-CM

## 2024-03-08 DIAGNOSIS — Z7984 Long term (current) use of oral hypoglycemic drugs: Secondary | ICD-10-CM

## 2024-03-11 ENCOUNTER — Encounter: Payer: Self-pay | Admitting: Internal Medicine
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
